# Patient Record
Sex: Female | Born: 1965
Health system: Southern US, Community
[De-identification: ages and names within clinical notes are randomized; demographics above are authoritative.]

## PROBLEM LIST (undated history)

## (undated) DIAGNOSIS — D649 Anemia, unspecified: Secondary | ICD-10-CM

## (undated) DIAGNOSIS — I469 Cardiac arrest, cause unspecified: Secondary | ICD-10-CM

## (undated) DIAGNOSIS — D259 Leiomyoma of uterus, unspecified: Secondary | ICD-10-CM

## (undated) DIAGNOSIS — E785 Hyperlipidemia, unspecified: Secondary | ICD-10-CM

## (undated) DIAGNOSIS — R569 Unspecified convulsions: Secondary | ICD-10-CM

## (undated) DIAGNOSIS — E119 Type 2 diabetes mellitus without complications: Secondary | ICD-10-CM

## (undated) DIAGNOSIS — I10 Essential (primary) hypertension: Secondary | ICD-10-CM

## (undated) DIAGNOSIS — D509 Iron deficiency anemia, unspecified: Secondary | ICD-10-CM

## (undated) DIAGNOSIS — K648 Other hemorrhoids: Secondary | ICD-10-CM

## (undated) DIAGNOSIS — E876 Hypokalemia: Secondary | ICD-10-CM

## (undated) DIAGNOSIS — I4901 Ventricular fibrillation: Secondary | ICD-10-CM

## (undated) DIAGNOSIS — R918 Other nonspecific abnormal finding of lung field: Secondary | ICD-10-CM

## (undated) DIAGNOSIS — G931 Anoxic brain damage, not elsewhere classified: Secondary | ICD-10-CM

## (undated) DIAGNOSIS — R41841 Cognitive communication deficit: Secondary | ICD-10-CM

## (undated) HISTORY — DX: Anoxic brain damage, not elsewhere classified: G93.1

## (undated) HISTORY — DX: Cardiac arrest, cause unspecified: I46.9

## (undated) HISTORY — DX: Cognitive communication deficit: R41.841

## (undated) HISTORY — DX: Ventricular fibrillation: I49.01

## (undated) HISTORY — DX: Hyperlipidemia, unspecified: E78.5

## (undated) HISTORY — DX: Unspecified convulsions: R56.9

---

## 2011-12-09 DIAGNOSIS — N920 Excessive and frequent menstruation with regular cycle: Secondary | ICD-10-CM | POA: Insufficient documentation

## 2011-12-09 DIAGNOSIS — L508 Other urticaria: Secondary | ICD-10-CM | POA: Insufficient documentation

## 2011-12-09 DIAGNOSIS — I10 Essential (primary) hypertension: Secondary | ICD-10-CM

## 2012-11-11 ENCOUNTER — Ambulatory Visit: Payer: Self-pay | Admitting: Internal Medicine

## 2012-11-29 ENCOUNTER — Ambulatory Visit: Payer: Self-pay | Admitting: Internal Medicine

## 2013-02-20 ENCOUNTER — Emergency Department: Payer: Self-pay | Admitting: Emergency Medicine

## 2013-02-20 LAB — CBC
MCH: 31.6 pg (ref 26.0–34.0)
MCV: 93 fL (ref 80–100)
Platelet: 213 10*3/uL (ref 150–440)
RBC: 3.68 10*6/uL — ABNORMAL LOW (ref 3.80–5.20)
RDW: 14.1 % (ref 11.5–14.5)
WBC: 6.3 10*3/uL (ref 3.6–11.0)

## 2013-02-20 LAB — CK TOTAL AND CKMB (NOT AT ARMC)
CK, Total: 94 U/L (ref 21–215)
CK-MB: <0.5 ng/mL — ABNORMAL LOW (ref 0.5–3.6)

## 2013-02-20 LAB — COMPREHENSIVE METABOLIC PANEL
Albumin: 3.2 g/dL — ABNORMAL LOW (ref 3.4–5.0)
Anion Gap: 9 (ref 7–16)
BUN: 12 mg/dL (ref 7–18)
Bilirubin,Total: 0.2 mg/dL (ref 0.2–1.0)
Calcium, Total: 8.7 mg/dL (ref 8.5–10.1)
Co2: 24 mmol/L (ref 21–32)
Creatinine: 0.67 mg/dL (ref 0.60–1.30)
EGFR (African American): >60
Glucose: 99 mg/dL (ref 65–99)
Osmolality: 274 (ref 275–301)
SGPT (ALT): 17 U/L (ref 12–78)
Sodium: 137 mmol/L (ref 136–145)
Total Protein: 7.6 g/dL (ref 6.4–8.2)

## 2013-08-18 ENCOUNTER — Ambulatory Visit: Payer: Self-pay | Admitting: Family Medicine

## 2013-08-22 ENCOUNTER — Emergency Department: Payer: Self-pay | Admitting: Internal Medicine

## 2013-10-25 ENCOUNTER — Ambulatory Visit: Payer: Self-pay | Admitting: Physician Assistant

## 2014-01-02 ENCOUNTER — Ambulatory Visit: Payer: Self-pay | Admitting: Physician Assistant

## 2015-02-05 DIAGNOSIS — D649 Anemia, unspecified: Secondary | ICD-10-CM | POA: Insufficient documentation

## 2016-03-15 ENCOUNTER — Encounter: Payer: Self-pay | Admitting: *Deleted

## 2016-03-15 ENCOUNTER — Emergency Department
Admission: EM | Admit: 2016-03-15 | Discharge: 2016-03-15 | Disposition: A | Discharge status: Home / Self Care | Payer: BLUE CROSS/BLUE SHIELD | Attending: Emergency Medicine | Admitting: Emergency Medicine

## 2016-03-15 DIAGNOSIS — Z87891 Personal history of nicotine dependence: Secondary | ICD-10-CM | POA: Diagnosis not present

## 2016-03-15 DIAGNOSIS — R51 Headache: Secondary | ICD-10-CM | POA: Diagnosis present

## 2016-03-15 DIAGNOSIS — I10 Essential (primary) hypertension: Secondary | ICD-10-CM | POA: Insufficient documentation

## 2016-03-15 DIAGNOSIS — Z79899 Other long term (current) drug therapy: Secondary | ICD-10-CM | POA: Diagnosis not present

## 2016-03-15 DIAGNOSIS — E876 Hypokalemia: Secondary | ICD-10-CM | POA: Insufficient documentation

## 2016-03-15 HISTORY — DX: Essential (primary) hypertension: I10

## 2016-03-15 LAB — COMPREHENSIVE METABOLIC PANEL
ALBUMIN: 3.7 g/dL (ref 3.5–5.0)
ALK PHOS: 66 U/L (ref 38–126)
ALT: 23 U/L (ref 14–54)
AST: 24 U/L (ref 15–41)
Anion gap: 9 (ref 5–15)
BILIRUBIN TOTAL: 0.3 mg/dL (ref 0.3–1.2)
BUN: 10 mg/dL (ref 6–20)
CALCIUM: 8.8 mg/dL — AB (ref 8.9–10.3)
CO2: 26 mmol/L (ref 22–32)
Chloride: 105 mmol/L (ref 101–111)
Creatinine, Ser: 0.65 mg/dL (ref 0.44–1.00)
GFR calc Af Amer: >60 mL/min (ref >60)
GFR calc non Af Amer: >60 mL/min (ref >60)
GLUCOSE: 116 mg/dL — AB (ref 65–99)
Potassium: 2.9 mmol/L — CL (ref 3.5–5.1)
SODIUM: 140 mmol/L (ref 135–145)
TOTAL PROTEIN: 7.5 g/dL (ref 6.5–8.1)

## 2016-03-15 LAB — CBC
HCT: 40 % (ref 35.0–47.0)
Hemoglobin: 13.5 g/dL (ref 12.0–16.0)
MCH: 32.5 pg (ref 26.0–34.0)
MCHC: 33.6 g/dL (ref 32.0–36.0)
MCV: 96.6 fL (ref 80.0–100.0)
PLATELETS: 216 10*3/uL (ref 150–440)
RBC: 4.15 MIL/uL (ref 3.80–5.20)
RDW: 13.6 % (ref 11.5–14.5)
WBC: 5.7 10*3/uL (ref 3.6–11.0)

## 2016-03-15 LAB — URINALYSIS COMPLETE WITH MICROSCOPIC (ARMC ONLY)
BILIRUBIN URINE: NEGATIVE
GLUCOSE, UA: NEGATIVE mg/dL
HGB URINE DIPSTICK: NEGATIVE
Ketones, ur: NEGATIVE mg/dL
Leukocytes, UA: NEGATIVE
Nitrite: NEGATIVE
PH: 7 (ref 5.0–8.0)
Protein, ur: NEGATIVE mg/dL
Specific Gravity, Urine: 1.006 (ref 1.005–1.030)
WBC, UA: NONE SEEN WBC/hpf (ref 0–5)

## 2016-03-15 MED ORDER — POTASSIUM CHLORIDE ER 10 MEQ PO TBCR: 10.0000 meq | EXTENDED_RELEASE_TABLET | Freq: Every day | ORAL | Status: DC

## 2016-03-15 MED ORDER — POTASSIUM CHLORIDE CRYS ER 20 MEQ PO TBCR
20.0000 meq | EXTENDED_RELEASE_TABLET | Freq: Once | ORAL | Status: AC
  Administered 2016-03-15: 20 meq via ORAL
  Filled 2016-03-15: qty 1

## 2016-03-15 NOTE — ED Notes (Signed)
Patient c/o feeling "cloudy-headed" all week, but felt better today after eating and taking a nap. Patient has a history of low potassium and felt like this could be the problem, so she ate more potassium-rich foods and felt better. This afternoon she developed the same light-headedness, nausea and a headache. Patient denies blurry vision.

## 2016-03-15 NOTE — ED Provider Notes (Signed)
[facility] Emergency Department Provider Note  Time seen: 9:02 PM  I have reviewed the triage vital signs and the nursing notes.   HISTORY  Chief Complaint Headache and Nausea    HPI [name] [name] is a 50 y.o. female with a past medical history of hypertension, hypokalemia, presents the emergency department feeling "cloudy headed." According to the patient for the past one week she has been feeling cloudy/foggy. States occasionally she feels lightheaded, does state a history of heavy periods. She also states she takes potassium supplements due to hypokalemia but ran out last month and does not have her yearly physical until next month.Patient is concerned that her potassium might be low causing her to feel this way. Patient denies any headache. Denies any trouble speaking, weakness or numbness. Denies any fever, cough, congestion. She does feel nasal and sinus congestion which she states happens every year due to allergies.     Past Medical History  Diagnosis Date  . Hypertension     There are no active problems to display for this patient.   History reviewed. No pertinent past surgical history.  Current Outpatient Rx  Name  Route  Sig  Dispense  Refill  . chlorthalidone (HYGROTON) 25 MG tablet   Oral   Take 25 mg by mouth daily.           Allergies Review of patient's allergies indicates no known allergies.  No family history on file.  Social History Social History  Substance Use Topics  . Smoking status: Former Research scientist (life sciences)  . Smokeless tobacco: None  . Alcohol Use: No    Review of Systems Constitutional: Negative for fever. Cardiovascular: Negative for chest pain. Respiratory: Negative for shortness of breath. Gastrointestinal: Negative for abdominal pain Genitourinary: Negative for dysuria. Neurological: Negative for headache 10-point ROS otherwise negative.  ____________________________________________   PHYSICAL EXAM:  VITAL  SIGNS: ED Triage Vitals  Enc Vitals Group     BP 03/15/16 2020 164/96 mmHg     Pulse Rate 03/15/16 2020 74     Resp 03/15/16 2020 18     Temp 03/15/16 2020 98.9 F (37.2 C)     Temp Source 03/15/16 2020 Oral     SpO2 03/15/16 2020 98 %     Weight 03/15/16 2020 172 lb (78.019 kg)     Height --      Head Cir --      Peak Flow --      Pain Score --      Pain Loc --      Pain Edu? --      Excl. in [place]? --     Constitutional: Alert and oriented. Well appearing and in no distress. Eyes: Normal exam ENT   Head: Normocephalic and atraumatic. Patient does have significant earwax to right ear, but no obvious signs of infection.   Nose: No congestion   Mouth/Throat: Mucous membranes are moist. Cardiovascular: Normal rate, regular rhythm. No murmur Respiratory: Normal respiratory effort without tachypnea nor retractions. Breath sounds are clear  Gastrointestinal: Soft and nontender. No distention.   Musculoskeletal: Nontender with normal range of motion in all extremities. Neurologic:  Normal speech and language. No gross focal neurologic deficits Skin:  Skin is warm, dry and intact.  Psychiatric: Mood and affect are normal.  ____________________________________________   INITIAL IMPRESSION / ASSESSMENT AND PLAN / ED COURSE  Pertinent labs & imaging results that were available during my care of the patient were reviewed by me and considered  in my medical decision making (see chart for details).  Very well-appearing patient complaining of cloudy headedness/foggy headed feeling. States it is difficult to describe what she is experiencing. States it comes and goes, denies any issues currently. Denies any headache or weakness/numbness at any time. We will check labs, monitor closely in the emergency department.  Labs show mild hypokalemia, otherwise within normal limits. We'll discharge on potassium supplements. Patient agreeable plan we'll follow up with her primary care  doctor. ____________________________________________   FINAL CLINICAL IMPRESSION(S) / ED DIAGNOSES  Hypokalemia   Harvest Dark, MD 03/15/16 [phone]

## 2016-03-15 NOTE — Discharge Instructions (Signed)

## 2016-03-15 NOTE — ED Notes (Signed)
Notified Dr. Truitt Merle of critical lab value - Potassium 2.9.  Acknowledged, no new orders.

## 2016-03-19 ENCOUNTER — Encounter: Payer: Self-pay | Admitting: Emergency Medicine

## 2016-03-19 ENCOUNTER — Ambulatory Visit
Admission: EM | Admit: 2016-03-19 | Discharge: 2016-03-19 | Disposition: A | Discharge status: Home / Self Care | Payer: BLUE CROSS/BLUE SHIELD | Attending: Family Medicine | Admitting: Family Medicine

## 2016-03-19 DIAGNOSIS — E876 Hypokalemia: Secondary | ICD-10-CM | POA: Diagnosis not present

## 2016-03-19 DIAGNOSIS — H6123 Impacted cerumen, bilateral: Secondary | ICD-10-CM

## 2016-03-19 DIAGNOSIS — I10 Essential (primary) hypertension: Secondary | ICD-10-CM | POA: Diagnosis not present

## 2016-03-19 LAB — BASIC METABOLIC PANEL WITH GFR
Anion gap: 7 (ref 5–15)
BUN: 11 mg/dL (ref 6–20)
CO2: 28 mmol/L (ref 22–32)
Calcium: 8.7 mg/dL — ABNORMAL LOW (ref 8.9–10.3)
Chloride: 103 mmol/L (ref 101–111)
Creatinine, Ser: 0.55 mg/dL (ref 0.44–1.00)
GFR calc Af Amer: >60 mL/min
GFR calc non Af Amer: >60 mL/min
Glucose, Bld: 102 mg/dL — ABNORMAL HIGH (ref 65–99)
Potassium: 3.1 mmol/L — ABNORMAL LOW (ref 3.5–5.1)
Sodium: 138 mmol/L (ref 135–145)

## 2016-03-19 NOTE — Discharge Instructions (Signed)
Cerumen Impaction The structures of the external ear canal secrete a waxy substance known as cerumen. Excess cerumen can build up in the ear canal, causing a condition known as cerumen impaction. Cerumen impaction can cause ear pain and disrupt the function of the ear. The rate of cerumen production differs for each individual. In certain individuals, the configuration of the ear canal may decrease his or her ability to naturally remove cerumen. CAUSES Cerumen impaction is caused by excessive cerumen production or buildup. RISK FACTORS  Frequent use of swabs to clean ears.  Having narrow ear canals.  Having eczema.  Being dehydrated. SIGNS AND SYMPTOMS  Diminished hearing.  Ear drainage.  Ear pain.  Ear itch. TREATMENT Treatment may involve:  Over-the-counter or prescription ear drops to soften the cerumen.  Removal of cerumen by a health care provider. This may be done with:  Irrigation with warm water. This is the most common method of removal.  Ear curettes and other instruments.  Surgery. This may be done in severe cases. HOME CARE INSTRUCTIONS  Take medicines only as directed by your health care provider.  Do not insert objects into the ear with the intent of cleaning the ear. PREVENTION  Do not insert objects into the ear, even with the intent of cleaning the ear. Removing cerumen as a part of normal hygiene is not necessary, and the use of swabs in the ear canal is not recommended.  Drink enough water to keep your urine clear or pale yellow.  Control your eczema if you have it. SEEK MEDICAL CARE IF:  You develop ear pain.  You develop bleeding from the ear.  The cerumen does not clear after you use ear drops as directed.   This information is not intended to replace advice given to you by your health care provider. Make sure you discuss any questions you have with your health care provider.   Document Released: 12/17/2004 Document Revised: 11/30/2014  Document Reviewed: 06/26/2015 Elsevier Interactive Patient Education 2016 [employer].  Hypokalemia Hypokalemia means that the amount of potassium in the blood is lower than normal.Potassium is a chemical, called an electrolyte, that helps regulate the amount of fluid in the body. It also stimulates muscle contraction and helps nerves function properly.Most of the body's potassium is inside of cells, and only a very small amount is in the blood. Because the amount in the blood is so small, minor changes can be life-threatening. CAUSES  Antibiotics.  Diarrhea or vomiting.  Using laxatives too much, which can cause diarrhea.  Chronic kidney disease.  Water pills (diuretics).  Eating disorders (bulimia).  Low magnesium level.  Sweating a lot. SIGNS AND SYMPTOMS  Weakness.  Constipation.  Fatigue.  Muscle cramps.  Mental confusion.  Skipped heartbeats or irregular heartbeat (palpitations).  Tingling or numbness. DIAGNOSIS  Your health care provider can diagnose hypokalemia with blood tests. In addition to checking your potassium level, your health care provider may also check other lab tests. TREATMENT Hypokalemia can be treated with potassium supplements taken by mouth or adjustments in your current medicines. If your potassium level is very low, you may need to get potassium through a vein (IV) and be monitored in the hospital. A diet high in potassium is also helpful. Foods high in potassium are:  Nuts, such as peanuts and pistachios.  Seeds, such as sunflower seeds and pumpkin seeds.  Peas, lentils, and lima beans.  Whole grain and bran cereals and breads.  Fresh fruit and vegetables, such as apricots,  avocado, bananas, cantaloupe, kiwi, oranges, tomatoes, asparagus, and potatoes.  Orange and tomato juices.  Red meats.  Fruit yogurt. HOME CARE INSTRUCTIONS  Take all medicines as prescribed by your health care provider.  Maintain a healthy diet by  including nutritious food, such as fruits, vegetables, nuts, whole grains, and lean meats.  If you are taking a laxative, be sure to follow the directions on the label. SEEK MEDICAL CARE IF:  Your weakness gets worse.  You feel your heart pounding or racing.  You are vomiting or having diarrhea.  You are diabetic and having trouble keeping your blood glucose in the normal range. SEEK IMMEDIATE MEDICAL CARE IF:  You have chest pain, shortness of breath, or dizziness.  You are vomiting or having diarrhea for more than 2 days.  You faint. MAKE SURE YOU:   Understand these instructions.  Will watch your condition.  Will get help right away if you are not doing well or get worse.   This information is not intended to replace advice given to you by your health care provider. Make sure you discuss any questions you have with your health care provider.   Document Released: 11/09/2005 Document Revised: 11/30/2014 Document Reviewed: 05/12/2013 Elsevier Interactive Patient Education 2016 [employer].  Hypertension Hypertension, commonly called high blood pressure, is when the force of blood pumping through your arteries is too strong. Your arteries are the blood vessels that carry blood from your heart throughout your body. A blood pressure reading consists of a higher number over a lower number, such as 110/72. The higher number (systolic) is the pressure inside your arteries when your heart pumps. The lower number (diastolic) is the pressure inside your arteries when your heart relaxes. Ideally you want your blood pressure below 120/80. Hypertension forces your heart to work harder to pump blood. Your arteries may become narrow or stiff. Having untreated or uncontrolled hypertension can cause heart attack, stroke, kidney disease, and other problems. RISK FACTORS Some risk factors for high blood pressure are controllable. Others are not.  Risk factors you cannot control include:    Race. You may be at higher risk if you are African American.  Age. Risk increases with age.  Gender. Men are at higher risk than women before age 36 years. After age 19, women are at higher risk than men. Risk factors you can control include:  Not getting enough exercise or physical activity.  Being overweight.  Getting too much fat, sugar, calories, or salt in your diet.  Drinking too much alcohol. SIGNS AND SYMPTOMS Hypertension does not usually cause signs or symptoms. Extremely high blood pressure (hypertensive crisis) may cause headache, anxiety, shortness of breath, and nosebleed. DIAGNOSIS To check if you have hypertension, your health care provider will measure your blood pressure while you are seated, with your arm held at the level of your heart. It should be measured at least twice using the same arm. Certain conditions can cause a difference in blood pressure between your right and left arms. A blood pressure reading that is higher than normal on one occasion does not mean that you need treatment. If it is not clear whether you have high blood pressure, you may be asked to return on a different day to have your blood pressure checked again. Or, you may be asked to monitor your blood pressure at home for 1 or more weeks. TREATMENT Treating high blood pressure includes making lifestyle changes and possibly taking medicine. Living a healthy lifestyle can help  lower high blood pressure. You may need to change some of your habits. Lifestyle changes may include:  Following the DASH diet. This diet is high in fruits, vegetables, and whole grains. It is low in salt, red meat, and added sugars.  Keep your sodium intake below 2,300 mg per day.  Getting at least 30-45 minutes of aerobic exercise at least 4 times per week.  Losing weight if necessary.  Not smoking.  Limiting alcoholic beverages.  Learning ways to reduce stress. Your health care provider may prescribe medicine  if lifestyle changes are not enough to get your blood pressure under control, and if one of the following is true:  You are 28-32 years of age and your systolic blood pressure is above 140.  You are 22 years of age or older, and your systolic blood pressure is above 150.  Your diastolic blood pressure is above 90.  You have diabetes, and your systolic blood pressure is over [id number] or your diastolic blood pressure is over 90.  You have kidney disease and your blood pressure is above 140/90.  You have heart disease and your blood pressure is above 140/90. Your personal target blood pressure may vary depending on your medical conditions, your age, and other factors. HOME CARE INSTRUCTIONS  Have your blood pressure rechecked as directed by your health care provider.   Take medicines only as directed by your health care provider. Follow the directions carefully. Blood pressure medicines must be taken as prescribed. The medicine does not work as well when you skip doses. Skipping doses also puts you at risk for problems.  Do not smoke.   Monitor your blood pressure at home as directed by your health care provider. SEEK MEDICAL CARE IF:   You think you are having a reaction to medicines taken.  You have recurrent headaches or feel dizzy.  You have swelling in your ankles.  You have trouble with your vision. SEEK IMMEDIATE MEDICAL CARE IF:  You develop a severe headache or confusion.  You have unusual weakness, numbness, or feel faint.  You have severe chest or abdominal pain.  You vomit repeatedly.  You have trouble breathing. MAKE SURE YOU:   Understand these instructions.  Will watch your condition.  Will get help right away if you are not doing well or get worse.   This information is not intended to replace advice given to you by your health care provider. Make sure you discuss any questions you have with your health care provider.   Document Released: 11/09/2005  Document Revised: 03/26/2015 Document Reviewed: 09/01/2013 Elsevier Interactive Patient Education [employer].

## 2016-03-19 NOTE — ED Provider Notes (Signed)
CSN: [id number]     Arrival date & time 03/19/16  [id number] History   First MD Initiated Contact with Patient 03/19/16 [phone]     Chief Complaint  Patient presents with  . Ear Fullness   (Consider location/radiation/quality/duration/timing/severity/associated sxs/prior Treatment) HPI  This is a 50 year old female who presents with the complaint of fullness in her right ear. She has been using over-the-counter preparations to dissolve wax but it seems to have only worsened her problem. She was in the emergency department 4 days ago complaining of a" swimmy headed feeling", was found to have hypokalemia at 2.9 placed on supplemental potassium rx which had run out. She states that she has an appointment with her primary care, Dr. Carrie Mew, at [facility] clinic on May 4. She is feeling improved since starting the supplemental potassium.       Past Medical History  Diagnosis Date  . Hypertension    History reviewed. No pertinent past surgical history. History reviewed. No pertinent family history. Social History  Substance Use Topics  . Smoking status: Former Research scientist (life sciences)  . Smokeless tobacco: None  . Alcohol Use: No   OB History    No data available     Review of Systems  Constitutional: Negative for fever, chills, activity change and fatigue.  HENT: Positive for hearing loss.     Allergies  Review of patient's allergies indicates no known allergies.  Home Medications   Prior to Admission medications   Medication Sig Start Date End Date Taking? Authorizing Provider  chlorthalidone (HYGROTON) 25 MG tablet Take 25 mg by mouth daily.    Historical Provider, MD  fexofenadine (ALLEGRA) 180 MG tablet Take 180 mg by mouth daily.    Historical Provider, MD  potassium chloride (K-DUR) 10 MEQ tablet Take 1 tablet (10 mEq total) by mouth daily. 03/15/16   Harvest Dark, MD   Meds Ordered and Administered this Visit  Medications - No data to display  BP 159/95 mmHg  Pulse 68  Temp(Src)  97.3 F (36.3 C) (Tympanic)  Resp 16  Ht 5\' 1"  (1.549 m)  Wt 172 lb (78.019 kg)  BMI 32.52 kg/m2  SpO2 100%  LMP 03/13/2016 (Exact Date) No data found.   Physical Exam  Constitutional: She is oriented to person, place, and time. She appears well-developed and well-nourished. No distress.  HENT:  Head: Normocephalic and atraumatic.  Nose: Nose normal.  Mouth/Throat: Oropharynx is clear and moist. No oropharyngeal exudate.  Both ears show a heavy burden of cerumen with impaction bilaterally.  Eyes: Conjunctivae are normal. Pupils are equal, round, and reactive to light.  Neck: Normal range of motion. Neck supple.  Musculoskeletal: Normal range of motion. She exhibits no edema or tenderness.  Lymphadenopathy:    She has no cervical adenopathy.  Neurological: She is alert and oriented to person, place, and time.  Skin: Skin is warm and dry. She is not diaphoretic.  Psychiatric: She has a normal mood and affect. Her behavior is normal. Judgment and thought content normal.  Nursing note and vitals reviewed.   ED Course  Procedures (including critical care time)  Labs Review Labs Reviewed  BASIC METABOLIC PANEL    Imaging Review No results found.   Visual Acuity Review  Right Eye Distance:   Left Eye Distance:   Bilateral Distance:    Right Eye Near:   Left Eye Near:    Bilateral Near:         MDM   1. Cerumen impaction, bilateral   2.  Hypokalemia due to inadequate potassium intake   3. Essential hypertension    Discharge Medication List as of 03/19/2016 10:50 AM    Plan: 1. Test/x-ray results and diagnosis reviewed with patient 2. rx as per orders; risks, benefits, potential side effects reviewed with patient 3. Recommend supportive treatment with Supplemental potassium follow-up with her primary care physician. The machine for chemistry was out of order and I told her that we will call her with the results as soon as they are available. She does not hear  from [place] at 5:00 she should call here for the results. I've instructed her to continue to instill the Debrox ear drops in her ears for the next 4 days followed by a thorough irrigation. This should completely clear the remaining wax. She did have a small amount of wax residual in the left ear. The right ear was completely clear. I told her that a cotton ball soaked in mineral oil and placed in her ear for 10 minutes once a week will likely prevent the wax buildup that she experiences. He should be examined every 6-12 months. She is to follow-up with her primary care as her blood pressures have been running slightly higher and her potassium was low and she will require a new tasks him supplementation unless her prescriptions are changed to a potassium sparing drug. 4. F/u prn if symptoms worsen or don't improve     Lorin Picket, PA-C 03/19/16 1114

## 2016-03-19 NOTE — ED Notes (Signed)
Patient c/o fullness in her right ear since Sunday.

## 2016-04-01 ENCOUNTER — Other Ambulatory Visit: Payer: Self-pay | Admitting: Physician Assistant

## 2016-04-01 DIAGNOSIS — Z1231 Encounter for screening mammogram for malignant neoplasm of breast: Secondary | ICD-10-CM

## 2016-04-01 DIAGNOSIS — E876 Hypokalemia: Secondary | ICD-10-CM | POA: Insufficient documentation

## 2016-04-15 ENCOUNTER — Ambulatory Visit: Payer: BLUE CROSS/BLUE SHIELD | Attending: Physician Assistant

## 2016-11-09 ENCOUNTER — Ambulatory Visit: Payer: BLUE CROSS/BLUE SHIELD | Attending: Physician Assistant

## 2017-08-04 ENCOUNTER — Other Ambulatory Visit: Payer: Self-pay | Admitting: Physician Assistant

## 2017-08-04 DIAGNOSIS — Z1231 Encounter for screening mammogram for malignant neoplasm of breast: Secondary | ICD-10-CM

## 2017-08-16 ENCOUNTER — Ambulatory Visit
Admission: RE | Admit: 2017-08-16 | Discharge: 2017-08-16 | Disposition: A | Discharge status: Home / Self Care | Payer: PRIVATE HEALTH INSURANCE | Source: Ambulatory Visit | Attending: Physician Assistant | Admitting: Physician Assistant

## 2017-08-16 DIAGNOSIS — Z1231 Encounter for screening mammogram for malignant neoplasm of breast: Secondary | ICD-10-CM | POA: Diagnosis present

## 2017-10-24 ENCOUNTER — Emergency Department
Admission: EM | Admit: 2017-10-24 | Discharge: 2017-10-24 | Disposition: A | Discharge status: Home / Self Care | Payer: PRIVATE HEALTH INSURANCE | Attending: Emergency Medicine | Admitting: Emergency Medicine

## 2017-10-24 DIAGNOSIS — I10 Essential (primary) hypertension: Secondary | ICD-10-CM | POA: Insufficient documentation

## 2017-10-24 DIAGNOSIS — J069 Acute upper respiratory infection, unspecified: Secondary | ICD-10-CM | POA: Diagnosis not present

## 2017-10-24 DIAGNOSIS — Z79899 Other long term (current) drug therapy: Secondary | ICD-10-CM | POA: Insufficient documentation

## 2017-10-24 DIAGNOSIS — E876 Hypokalemia: Secondary | ICD-10-CM | POA: Diagnosis not present

## 2017-10-24 DIAGNOSIS — R110 Nausea: Secondary | ICD-10-CM

## 2017-10-24 DIAGNOSIS — Z87891 Personal history of nicotine dependence: Secondary | ICD-10-CM | POA: Insufficient documentation

## 2017-10-24 LAB — COMPREHENSIVE METABOLIC PANEL
ALBUMIN: 3.6 g/dL (ref 3.5–5.0)
ALT: 44 U/L (ref 14–54)
ANION GAP: 11 (ref 5–15)
AST: 54 U/L — ABNORMAL HIGH (ref 15–41)
Alkaline Phosphatase: 65 U/L (ref 38–126)
BILIRUBIN TOTAL: 0.8 mg/dL (ref 0.3–1.2)
BUN: 8 mg/dL (ref 6–20)
CO2: 25 mmol/L (ref 22–32)
Calcium: 8.6 mg/dL — ABNORMAL LOW (ref 8.9–10.3)
Chloride: 97 mmol/L — ABNORMAL LOW (ref 101–111)
Creatinine, Ser: 0.5 mg/dL (ref 0.44–1.00)
GFR calc Af Amer: >60 mL/min (ref >60)
Glucose, Bld: 150 mg/dL — ABNORMAL HIGH (ref 65–99)
POTASSIUM: 2.6 mmol/L — AB (ref 3.5–5.1)
Sodium: 133 mmol/L — ABNORMAL LOW (ref 135–145)
Total Protein: 7.5 g/dL (ref 6.5–8.1)

## 2017-10-24 LAB — CBC
HCT: 38.8 % (ref 35.0–47.0)
Hemoglobin: 13.2 g/dL (ref 12.0–16.0)
MCH: 31.9 pg (ref 26.0–34.0)
MCHC: 34 g/dL (ref 32.0–36.0)
MCV: 93.8 fL (ref 80.0–100.0)
PLATELETS: 207 10*3/uL (ref 150–440)
RBC: 4.14 MIL/uL (ref 3.80–5.20)
RDW: 13.9 % (ref 11.5–14.5)
WBC: 15.9 10*3/uL — ABNORMAL HIGH (ref 3.6–11.0)

## 2017-10-24 LAB — BASIC METABOLIC PANEL
ANION GAP: 9 (ref 5–15)
BUN: 7 mg/dL (ref 6–20)
CHLORIDE: 100 mmol/L — AB (ref 101–111)
CO2: 25 mmol/L (ref 22–32)
Calcium: 8.2 mg/dL — ABNORMAL LOW (ref 8.9–10.3)
Creatinine, Ser: 0.71 mg/dL (ref 0.44–1.00)
Glucose, Bld: 107 mg/dL — ABNORMAL HIGH (ref 65–99)
POTASSIUM: 3 mmol/L — AB (ref 3.5–5.1)
SODIUM: 134 mmol/L — AB (ref 135–145)

## 2017-10-24 LAB — URINALYSIS, COMPLETE (UACMP) WITH MICROSCOPIC
Bacteria, UA: NONE SEEN
Bilirubin Urine: NEGATIVE
GLUCOSE, UA: NEGATIVE mg/dL
Hgb urine dipstick: NEGATIVE
KETONES UR: NEGATIVE mg/dL
Leukocytes, UA: NEGATIVE
Nitrite: NEGATIVE
PH: 7 (ref 5.0–8.0)
Protein, ur: NEGATIVE mg/dL
RBC / HPF: NONE SEEN RBC/hpf (ref 0–5)
SPECIFIC GRAVITY, URINE: 1.012 (ref 1.005–1.030)

## 2017-10-24 LAB — LIPASE, BLOOD: Lipase: 25 U/L (ref 11–51)

## 2017-10-24 LAB — MAGNESIUM: MAGNESIUM: 1.7 mg/dL (ref 1.7–2.4)

## 2017-10-24 LAB — TROPONIN I: Troponin I: 0.03 ng/mL (ref <0.03)

## 2017-10-24 MED ORDER — POTASSIUM CHLORIDE CRYS ER 20 MEQ PO TBCR
40.0000 meq | EXTENDED_RELEASE_TABLET | Freq: Once | ORAL | Status: AC
  Administered 2017-10-24: 40 meq via ORAL
  Filled 2017-10-24: qty 2

## 2017-10-24 MED ORDER — POTASSIUM CHLORIDE 10 MEQ/100ML IV SOLN
10.0000 meq | Freq: Once | INTRAVENOUS | Status: AC
  Administered 2017-10-24: 10 meq via INTRAVENOUS
  Filled 2017-10-24: qty 100

## 2017-10-24 MED ORDER — POTASSIUM CHLORIDE CRYS ER 20 MEQ PO TBCR: 20.0000 meq | EXTENDED_RELEASE_TABLET | Freq: Two times a day (BID) | ORAL | 0 refills | Status: DC

## 2017-10-24 NOTE — ED Triage Notes (Signed)
Pt presents via POV stating "thought I had a cold" beginning Wednesday however felt cold chills last PM and woke up sweating. Reports nausea this am.

## 2017-10-24 NOTE — ED Provider Notes (Addendum)
[facility] Emergency Department Provider Note  ____________________________________________   I have reviewed the triage vital signs and the nursing notes.   HISTORY  Chief Complaint Nausea    HPI TANIJA GERMANI is a 51 y.o. female who presents today complaining of runny nose, nausea, and chills this morning.  She did not vomit.  She has no chest pain she has no shortness of breath.  She has no cough.  She states that she is starting to feel much better since she is gotten here.  She denies fever.  She did have some chills. Started yesterday with rhinorrhea and this morning.  At this time however she is feeling much better.  She does have a history of hypokalemia she takes potassium supplementation she is on HCTZ.  No recent change in that.  Past Medical History:  Diagnosis Date  . Hypertension     There are no active problems to display for this patient.   History reviewed. No pertinent surgical history.  Prior to Admission medications   Medication Sig Start Date End Date Taking? Authorizing Provider  chlorthalidone (HYGROTON) 25 MG tablet Take 25 mg by mouth daily.    [provider]  fexofenadine (ALLEGRA) 180 MG tablet Take 180 mg by mouth daily.    [provider]  potassium chloride (K-DUR) 10 MEQ tablet Take 1 tablet (10 mEq total) by mouth daily. 03/15/16   Harvest Dark, MD    Allergies Patient has no known allergies.  History reviewed. No pertinent family history.  Social History Social History   Tobacco Use  . Smoking status: Former Research scientist (life sciences)  . Smokeless tobacco: Never Used  Substance Use Topics  . Alcohol use: No  . Drug use: Not on file    Review of Systems Constitutional: Subjectively warm Eyes: No visual changes. ENT: No sore throat. No stiff neck no neck pain Cardiovascular: Denies chest pain. Respiratory: Denies shortness of breath. Gastrointestinal:   no vomiting.  No diarrhea.  No constipation.   Positive nausea Genitourinary: Negative for dysuria. Musculoskeletal: Negative lower extremity swelling Skin: Negative for rash. Neurological: Negative for severe headaches, focal weakness or numbness.   ____________________________________________   PHYSICAL EXAM:  VITAL SIGNS: ED Triage Vitals  Enc Vitals Group     BP 10/24/17 0732 126/77     Pulse Rate 10/24/17 0732 (!) 104     Resp 10/24/17 0732 18     Temp 10/24/17 0732 98.7 F (37.1 C)     Temp Source 10/24/17 0732 Oral     SpO2 10/24/17 0732 98 %     Weight 10/24/17 0733 189 lb (85.7 kg)     Height 10/24/17 0733 5\' 1"  (1.549 m)     Head Circumference --      Peak Flow --      Pain Score --      Pain Loc --      Pain Edu? --      Excl. in [place]? --     Constitutional: Alert and oriented. Well appearing and in no acute distress. Eyes: Conjunctivae are normal Head: Atraumatic HEENT: Positive congestion/rhinnorhea. Mucous membranes are moist.  Oropharynx non-erythematous Neck:   Nontender with no meningismus, no masses, no stridor Cardiovascular: Normal rate, regular rhythm. Grossly normal heart sounds.  Good peripheral circulation. Respiratory: Normal respiratory effort.  No retractions. Lungs CTAB. Abdominal: Soft and nontender. No distention. No guarding no rebound Back:  There is no focal tenderness or step off.  there is no midline  tenderness there are no lesions noted. there is no CVA tenderness Musculoskeletal: No lower extremity tenderness, no upper extremity tenderness. No joint effusions, no DVT signs strong distal pulses no edema Neurologic:  Normal speech and language. No gross focal neurologic deficits are appreciated.  Skin:  Skin is warm, dry and intact. No rash noted. Psychiatric: Mood and affect are normal. Speech and behavior are normal.  ____________________________________________   LABS (all labs ordered are listed, but only abnormal results are displayed)  Labs Reviewed  COMPREHENSIVE  METABOLIC PANEL - Abnormal; Notable for the following components:      Result Value   Sodium 133 (*)    Potassium 2.6 (*)    Chloride 97 (*)    Glucose, Bld 150 (*)    Calcium 8.6 (*)    AST 54 (*)    All other components within normal limits  CBC - Abnormal; Notable for the following components:   WBC 15.9 (*)    All other components within normal limits  TROPONIN I - Abnormal; Notable for the following components:   Troponin I 0.03 (*)    All other components within normal limits  LIPASE, BLOOD  URINALYSIS, COMPLETE (UACMP) WITH MICROSCOPIC  MAGNESIUM    Pertinent labs  results that were available during my care of the patient were reviewed by me and considered in my medical decision making (see chart for details). ____________________________________________  EKG  I personally interpreted any EKGs ordered by me or triage Late entry, sinus rhythm, RSR prime noted, no acute ST elevation or depression nonspecific ST changes, rate 83 ____________________________________________  RADIOLOGY  Pertinent labs & imaging results that were available during my care of the patient were reviewed by me and considered in my medical decision making (see chart for details). If possible, patient and/or family made aware of any abnormal findings.  No results found. ____________________________________________    PROCEDURES  Procedure(s) performed: None  Procedures  Critical Care performed: None  ____________________________________________   INITIAL IMPRESSION / ASSESSMENT AND PLAN / ED COURSE  Pertinent labs & imaging results that were available during my care of the patient were reviewed by me and considered in my medical decision making (see chart for details).  Patient here with nausea, subjective fevers, rhinorrhea, however very well-appearing most likely this is viral pathology we did do basic blood work given her nausea and her history of high blood pressure I did do a  troponin which is essentially negative.  We will consider whether it needs to be rechecked.  She has no chest pain or symptoms at this time.  Her potassium however is quite low at 2.6, will EKG evaluation is indicated and supplementation which we are initiating.  Patient does have a very low potassium at baseline.  We will discuss with her whether she consents to admission for potassium repletion.  Magnesium is also pending.    ____________________________________________   FINAL CLINICAL IMPRESSION(S) / ED DIAGNOSES  Final diagnoses:  None      This chart was dictated using voice recognition software.  Despite best efforts to proofread,  errors can occur which can change meaning.      Schuyler Amor, MD 10/24/17 1013    Schuyler Amor, MD 11/07/17 2226

## 2018-04-13 ENCOUNTER — Encounter: Payer: Self-pay | Admitting: Emergency Medicine

## 2018-04-13 ENCOUNTER — Other Ambulatory Visit: Payer: Self-pay

## 2018-04-13 ENCOUNTER — Ambulatory Visit
Admission: EM | Admit: 2018-04-13 | Discharge: 2018-04-13 | Disposition: A | Discharge status: Home / Self Care | Payer: PRIVATE HEALTH INSURANCE | Attending: Family Medicine | Admitting: Family Medicine

## 2018-04-13 DIAGNOSIS — B354 Tinea corporis: Secondary | ICD-10-CM | POA: Diagnosis not present

## 2018-04-13 DIAGNOSIS — L739 Follicular disorder, unspecified: Secondary | ICD-10-CM

## 2018-04-13 MED ORDER — CEPHALEXIN 500 MG PO CAPS: 500.0000 mg | ORAL_CAPSULE | Freq: Three times a day (TID) | ORAL | 0 refills | Status: AC

## 2018-04-13 MED ORDER — KETOCONAZOLE 2 % EX CREA: 1.0000 "application " | TOPICAL_CREAM | Freq: Every day | CUTANEOUS | 0 refills | Status: DC

## 2018-04-13 MED ORDER — MUPIROCIN 2 % EX OINT: TOPICAL_OINTMENT | CUTANEOUS | 0 refills | Status: DC

## 2018-04-13 NOTE — ED Provider Notes (Signed)
[facility] ____________________________________________  Time seen: Approximately 10:53 AM  I have reviewed the triage vital signs and the nursing notes.   HISTORY  Chief Complaint  Rash  HPI [name] [name] is a 52 y.o. female presenting for evaluation of rash to groin area present for the last several days.  Patient reports that she initially noticed a round raised red area to left groin that was slightly itchy, but nontender.  States that she then shaved the groin area and states that now she has bumps on both sides.  Denies associated pains, fevers, insect bites, drainage or other complaints.  States did apply some topical Lotrisone cream without changes.  Reports otherwise feels well.  Denies changes in foods, medicines, lotions, detergents or other contacts.  Denies history of same in the past.  Does report she has sensitive skin. Denies recent sickness. Denies recent antibiotic use.    Past Medical History:  Diagnosis Date  . Hypertension     There are no active problems to display for this patient.   History reviewed. No pertinent surgical history.   No current facility-administered medications for this encounter.   Current Outpatient Medications:  .  chlorthalidone (HYGROTON) 25 MG tablet, Take 25 mg by mouth daily., Disp: , Rfl:  .  fexofenadine (ALLEGRA) 180 MG tablet, Take 180 mg by mouth daily., Disp: , Rfl:  .  potassium chloride SA (K-DUR,KLOR-CON) 20 MEQ tablet, Take 1 tablet (20 mEq total) by mouth 2 (two) times daily., Disp: 10 tablet, Rfl: 0 .  cephALEXin (KEFLEX) 500 MG capsule, Take 1 capsule (500 mg total) by mouth 3 (three) times daily for 7 days., Disp: 21 capsule, Rfl: 0 .  ketoconazole (NIZORAL) 2 % cream, Apply 1 application topically daily. For 2 weeks, Disp: 15 g, Rfl: 0 .  mupirocin ointment (BACTROBAN) 2 %, Apply two times a day for 7 days., Disp: 22 g, Rfl: 0  Allergies Patient has no known allergies.  Family History  Problem  Relation Age of Onset  . Hypertension Mother   . Leukemia Mother   . Diabetes Mother   . Healthy Father     Social History Social History   Tobacco Use  . Smoking status: Former Smoker    Last attempt to quit: 2007    Years since quitting: 12.3  . Smokeless tobacco: Never Used  Substance Use Topics  . Alcohol use: No  . Drug use: Never    Review of Systems Constitutional: No fever/chills Cardiovascular: Denies chest pain. Respiratory: Denies shortness of breath. Gastrointestinal: No abdominal pain.   Genitourinary: Negative for dysuria. Musculoskeletal: Negative for back pain. Skin: Positive for rash.  ____________________________________________   PHYSICAL EXAM:  VITAL SIGNS: ED Triage Vitals [04/13/18 0931]  Enc Vitals Group     BP (!) 151/97     Pulse Rate 64     Resp 16     Temp 97.8 F (36.6 C)     Temp Source Oral     SpO2 98 %     Weight 186 lb (84.4 kg)     Height 5\' 1"  (1.549 m)     Head Circumference      Peak Flow      Pain Score 0     Pain Loc      Pain Edu?      Excl. in [place]?     Constitutional: Alert and oriented. Well appearing and in no acute distress. ENT      Head: Normocephalic and  atraumatic.      Mouth/Throat: Mucous membranes are moist.Oropharynx non-erythematous. Cardiovascular: Normal rate, regular rhythm. Grossly normal heart sounds.  Good peripheral circulation. Respiratory: Normal respiratory effort without tachypnea nor retractions. Breath sounds are clear and equal bilaterally. No wheezes, rales, rhonchi. Gastrointestinal: Soft and nontender. No distention. Normal Bowel sounds. No CVA tenderness. Musculoskeletal: Steady gait.  Neurologic:  Normal speech and language. Speech is normal. No gait instability.  Skin:  Skin is warm, dry.  Except: Just inferior to left inguinal crease mildly erythematous oval rash with raised borders with dry skin present, scattered surrounding to left inguinal as well as right inguinal creases  mildly erythematous papules, nontender, no induration, no drainage, no crusting. Psychiatric: Mood and affect are normal. Speech and behavior are normal. Patient exhibits appropriate insight and judgment   ___________________________________________   LABS (all labs ordered are listed, but only abnormal results are displayed)  Labs Reviewed - No data to display ____________________________________________  PROCEDURES Procedures    INITIAL IMPRESSION / ASSESSMENT AND PLAN / ED COURSE  Pertinent labs & imaging results that were available during my care of the patient were reviewed by me and considered in my medical decision making (see chart for details).  Well-appearing patient.  No acute distress.Left inguinal rash appearance consistent with tinea cruris.  Patient does report area worsening after shaving that area, concern for spread of tinea versus folliculitis.  Will treat with topical ketoconazole as well as Bactroban.  Rx given for oral Keflex if she noticed the redness and areas continue to spread as concern for infectious folliculitis after 3 days.Discussed indication, risks and benefits of medications with patient. Discussed indication, risks and benefits of medications with patient.  Discussed follow up with Primary care physician this week. Discussed follow up and return parameters including no resolution or any worsening concerns. Patient verbalized understanding and agreed to plan.   ____________________________________________   FINAL CLINICAL IMPRESSION(S) / ED DIAGNOSES  Final diagnoses:  Ringworm, body  Folliculitis     ED Discharge Orders        Ordered    ketoconazole (NIZORAL) 2 % cream  Daily     04/13/18 1019    mupirocin ointment (BACTROBAN) 2 %     04/13/18 1019    cephALEXin (KEFLEX) 500 MG capsule  3 times daily     04/13/18 1019       Note: This dictation was prepared with Dragon dictation along with smaller phrase technology. Any  transcriptional errors that result from this process are unintentional.         Marylene Land, NP 04/13/18 1143

## 2018-04-13 NOTE — Discharge Instructions (Signed)
Take medication as prescribed. Rest. Drink plenty of fluids. Keep dry. Avoid irritation.  Follow up with your primary care physician this week as needed. Return to Urgent care for new or worsening concerns.

## 2018-04-13 NOTE — ED Triage Notes (Signed)
Patient in today c/o rash in the bilateral upper thigh/groin area x 4 days. Patient has been using an OTC anti-fungal type cream without relief.

## 2020-11-23 DIAGNOSIS — G931 Anoxic brain damage, not elsewhere classified: Secondary | ICD-10-CM

## 2020-11-23 HISTORY — DX: Anoxic brain damage, not elsewhere classified: G93.1

## 2020-12-02 ENCOUNTER — Encounter: Payer: Self-pay | Admitting: Internal Medicine

## 2020-12-02 ENCOUNTER — Other Ambulatory Visit: Payer: Self-pay

## 2020-12-02 ENCOUNTER — Emergency Department: Payer: Self-pay

## 2020-12-02 ENCOUNTER — Inpatient Hospital Stay
Admission: EM | Admit: 2020-12-02 | Discharge: 2021-01-10 | DRG: 004 | Disposition: A | Discharge status: Rehabilitation Facility | Payer: Self-pay | Attending: Family Medicine | Admitting: Family Medicine

## 2020-12-02 DIAGNOSIS — I5041 Acute combined systolic (congestive) and diastolic (congestive) heart failure: Secondary | ICD-10-CM | POA: Diagnosis not present

## 2020-12-02 DIAGNOSIS — N938 Other specified abnormal uterine and vaginal bleeding: Secondary | ICD-10-CM | POA: Diagnosis present

## 2020-12-02 DIAGNOSIS — Z452 Encounter for adjustment and management of vascular access device: Secondary | ICD-10-CM

## 2020-12-02 DIAGNOSIS — N390 Urinary tract infection, site not specified: Secondary | ICD-10-CM | POA: Diagnosis present

## 2020-12-02 DIAGNOSIS — J690 Pneumonitis due to inhalation of food and vomit: Secondary | ICD-10-CM

## 2020-12-02 DIAGNOSIS — E871 Hypo-osmolality and hyponatremia: Secondary | ICD-10-CM | POA: Diagnosis not present

## 2020-12-02 DIAGNOSIS — Z931 Gastrostomy status: Secondary | ICD-10-CM

## 2020-12-02 DIAGNOSIS — G931 Anoxic brain damage, not elsewhere classified: Secondary | ICD-10-CM | POA: Diagnosis present

## 2020-12-02 DIAGNOSIS — I469 Cardiac arrest, cause unspecified: Secondary | ICD-10-CM

## 2020-12-02 DIAGNOSIS — G253 Myoclonus: Secondary | ICD-10-CM | POA: Diagnosis present

## 2020-12-02 DIAGNOSIS — R570 Cardiogenic shock: Secondary | ICD-10-CM | POA: Diagnosis present

## 2020-12-02 DIAGNOSIS — I313 Pericardial effusion (noninflammatory): Secondary | ICD-10-CM | POA: Diagnosis present

## 2020-12-02 DIAGNOSIS — E8809 Other disorders of plasma-protein metabolism, not elsewhere classified: Secondary | ICD-10-CM | POA: Diagnosis present

## 2020-12-02 DIAGNOSIS — R7303 Prediabetes: Secondary | ICD-10-CM

## 2020-12-02 DIAGNOSIS — Z6839 Body mass index (BMI) 39.0-39.9, adult: Secondary | ICD-10-CM

## 2020-12-02 DIAGNOSIS — Z20822 Contact with and (suspected) exposure to covid-19: Secondary | ICD-10-CM | POA: Diagnosis present

## 2020-12-02 DIAGNOSIS — G928 Other toxic encephalopathy: Secondary | ICD-10-CM | POA: Diagnosis present

## 2020-12-02 DIAGNOSIS — Z978 Presence of other specified devices: Secondary | ICD-10-CM

## 2020-12-02 DIAGNOSIS — J9601 Acute respiratory failure with hypoxia: Secondary | ICD-10-CM | POA: Diagnosis present

## 2020-12-02 DIAGNOSIS — Z87891 Personal history of nicotine dependence: Secondary | ICD-10-CM

## 2020-12-02 DIAGNOSIS — I4901 Ventricular fibrillation: Principal | ICD-10-CM | POA: Diagnosis present

## 2020-12-02 DIAGNOSIS — J9600 Acute respiratory failure, unspecified whether with hypoxia or hypercapnia: Secondary | ICD-10-CM

## 2020-12-02 DIAGNOSIS — K449 Diaphragmatic hernia without obstruction or gangrene: Secondary | ICD-10-CM | POA: Diagnosis present

## 2020-12-02 DIAGNOSIS — G4089 Other seizures: Secondary | ICD-10-CM | POA: Diagnosis not present

## 2020-12-02 DIAGNOSIS — E870 Hyperosmolality and hypernatremia: Secondary | ICD-10-CM | POA: Diagnosis not present

## 2020-12-02 DIAGNOSIS — J189 Pneumonia, unspecified organism: Secondary | ICD-10-CM

## 2020-12-02 DIAGNOSIS — R1110 Vomiting, unspecified: Secondary | ICD-10-CM

## 2020-12-02 DIAGNOSIS — Z79899 Other long term (current) drug therapy: Secondary | ICD-10-CM

## 2020-12-02 DIAGNOSIS — Z833 Family history of diabetes mellitus: Secondary | ICD-10-CM

## 2020-12-02 DIAGNOSIS — N179 Acute kidney failure, unspecified: Secondary | ICD-10-CM | POA: Diagnosis present

## 2020-12-02 DIAGNOSIS — E876 Hypokalemia: Secondary | ICD-10-CM | POA: Diagnosis not present

## 2020-12-02 DIAGNOSIS — E785 Hyperlipidemia, unspecified: Secondary | ICD-10-CM

## 2020-12-02 DIAGNOSIS — D62 Acute posthemorrhagic anemia: Secondary | ICD-10-CM | POA: Diagnosis present

## 2020-12-02 DIAGNOSIS — R403 Persistent vegetative state: Secondary | ICD-10-CM | POA: Diagnosis present

## 2020-12-02 DIAGNOSIS — I110 Hypertensive heart disease with heart failure: Secondary | ICD-10-CM | POA: Diagnosis present

## 2020-12-02 DIAGNOSIS — R739 Hyperglycemia, unspecified: Secondary | ICD-10-CM | POA: Diagnosis present

## 2020-12-02 DIAGNOSIS — R0682 Tachypnea, not elsewhere classified: Secondary | ICD-10-CM

## 2020-12-02 DIAGNOSIS — J9602 Acute respiratory failure with hypercapnia: Secondary | ICD-10-CM | POA: Diagnosis present

## 2020-12-02 DIAGNOSIS — I462 Cardiac arrest due to underlying cardiac condition: Secondary | ICD-10-CM | POA: Diagnosis present

## 2020-12-02 DIAGNOSIS — B952 Enterococcus as the cause of diseases classified elsewhere: Secondary | ICD-10-CM | POA: Diagnosis present

## 2020-12-02 DIAGNOSIS — R680 Hypothermia, not associated with low environmental temperature: Secondary | ICD-10-CM | POA: Diagnosis present

## 2020-12-02 DIAGNOSIS — Z8709 Personal history of other diseases of the respiratory system: Secondary | ICD-10-CM

## 2020-12-02 DIAGNOSIS — E46 Unspecified protein-calorie malnutrition: Secondary | ICD-10-CM

## 2020-12-02 DIAGNOSIS — Z930 Tracheostomy status: Secondary | ICD-10-CM

## 2020-12-02 DIAGNOSIS — Z4659 Encounter for fitting and adjustment of other gastrointestinal appliance and device: Secondary | ICD-10-CM

## 2020-12-02 DIAGNOSIS — R1310 Dysphagia, unspecified: Secondary | ICD-10-CM | POA: Diagnosis present

## 2020-12-02 DIAGNOSIS — I10 Essential (primary) hypertension: Secondary | ICD-10-CM

## 2020-12-02 HISTORY — DX: Personal history of other diseases of the respiratory system: Z87.09

## 2020-12-02 HISTORY — DX: Cardiac arrest, cause unspecified: I46.9

## 2020-12-02 LAB — URINALYSIS, COMPLETE (UACMP) WITH MICROSCOPIC
Bilirubin Urine: NEGATIVE
Glucose, UA: >=500 mg/dL — AB
Ketones, ur: NEGATIVE mg/dL
Leukocytes,Ua: NEGATIVE
Nitrite: NEGATIVE
Protein, ur: 100 mg/dL — AB
RBC / HPF: >50 RBC/hpf — ABNORMAL HIGH (ref 0–5)
Specific Gravity, Urine: 1.005 (ref 1.005–1.030)
pH: 8 (ref 5.0–8.0)

## 2020-12-02 LAB — GLUCOSE, CAPILLARY
Glucose-Capillary: 240 mg/dL — ABNORMAL HIGH (ref 70–99)
Glucose-Capillary: 160 mg/dL — ABNORMAL HIGH (ref 70–99)
Glucose-Capillary: 139 mg/dL — ABNORMAL HIGH (ref 70–99)
Glucose-Capillary: 108 mg/dL — ABNORMAL HIGH (ref 70–99)
Glucose-Capillary: 122 mg/dL — ABNORMAL HIGH (ref 70–99)

## 2020-12-02 LAB — CBC WITH DIFFERENTIAL/PLATELET
Abs Immature Granulocytes: 0.14 10*3/uL — ABNORMAL HIGH (ref 0.00–0.07)
Basophils Absolute: 0.1 10*3/uL (ref 0.0–0.1)
Basophils Relative: 1 %
Eosinophils Absolute: 0.6 10*3/uL — ABNORMAL HIGH (ref 0.0–0.5)
Eosinophils Relative: 5 %
HCT: 33.3 % — ABNORMAL LOW (ref 36.0–46.0)
Hemoglobin: 9.7 g/dL — ABNORMAL LOW (ref 12.0–15.0)
Immature Granulocytes: 1 %
Lymphocytes Relative: 44 %
Lymphs Abs: 5.2 10*3/uL — ABNORMAL HIGH (ref 0.7–4.0)
MCH: 24.3 pg — ABNORMAL LOW (ref 26.0–34.0)
MCHC: 29.1 g/dL — ABNORMAL LOW (ref 30.0–36.0)
MCV: 83.5 fL (ref 80.0–100.0)
Monocytes Absolute: 0.4 10*3/uL (ref 0.1–1.0)
Monocytes Relative: 3 %
Neutro Abs: 5.5 10*3/uL (ref 1.7–7.7)
Neutrophils Relative %: 46 %
Platelets: 277 10*3/uL (ref 150–400)
RBC: 3.99 MIL/uL (ref 3.87–5.11)
RDW: 18.2 % — ABNORMAL HIGH (ref 11.5–15.5)
Smear Review: NORMAL
WBC: 11.9 10*3/uL — ABNORMAL HIGH (ref 4.0–10.5)
nRBC: 0.2 % (ref 0.0–0.2)

## 2020-12-02 LAB — COMPREHENSIVE METABOLIC PANEL
ALT: 120 U/L — ABNORMAL HIGH (ref 0–44)
AST: 138 U/L — ABNORMAL HIGH (ref 15–41)
Albumin: 3.2 g/dL — ABNORMAL LOW (ref 3.5–5.0)
Alkaline Phosphatase: 71 U/L (ref 38–126)
Anion gap: 21 — ABNORMAL HIGH (ref 5–15)
BUN: 7 mg/dL (ref 6–20)
CO2: 20 mmol/L — ABNORMAL LOW (ref 22–32)
Calcium: 8.5 mg/dL — ABNORMAL LOW (ref 8.9–10.3)
Chloride: 100 mmol/L (ref 98–111)
Creatinine, Ser: 1.01 mg/dL — ABNORMAL HIGH (ref 0.44–1.00)
GFR, Estimated: >60 mL/min (ref >60)
Glucose, Bld: 357 mg/dL — ABNORMAL HIGH (ref 70–99)
Potassium: 2.1 mmol/L — CL (ref 3.5–5.1)
Sodium: 141 mmol/L (ref 135–145)
Total Bilirubin: 0.6 mg/dL (ref 0.3–1.2)
Total Protein: 7.5 g/dL (ref 6.5–8.1)

## 2020-12-02 LAB — BLOOD GAS, ARTERIAL
Acid-Base Excess: 0 mmol/L (ref 0.0–2.0)
Bicarbonate: 25.9 mmol/L (ref 20.0–28.0)
FIO2: 0.4
MECHVT: 450 mL
O2 Saturation: 92.9 %
PEEP: 5 cmH2O
Patient temperature: 37
RATE: 15 resp/min
pCO2 arterial: 47 mmHg (ref 32.0–48.0)
pH, Arterial: 7.35 (ref 7.350–7.450)
pO2, Arterial: 70 mmHg — ABNORMAL LOW (ref 83.0–108.0)

## 2020-12-02 LAB — MRSA PCR SCREENING: MRSA by PCR: NEGATIVE

## 2020-12-02 LAB — RESP PANEL BY RT-PCR (FLU A&B, COVID) ARPGX2
Influenza A by PCR: NEGATIVE
Influenza B by PCR: NEGATIVE
SARS Coronavirus 2 by RT PCR: NEGATIVE

## 2020-12-02 LAB — BASIC METABOLIC PANEL
Anion gap: 17 — ABNORMAL HIGH (ref 5–15)
Anion gap: 16 — ABNORMAL HIGH (ref 5–15)
BUN: 8 mg/dL (ref 6–20)
BUN: 11 mg/dL (ref 6–20)
CO2: 22 mmol/L (ref 22–32)
CO2: 24 mmol/L (ref 22–32)
Calcium: 7.6 mg/dL — ABNORMAL LOW (ref 8.9–10.3)
Calcium: 7.8 mg/dL — ABNORMAL LOW (ref 8.9–10.3)
Chloride: 103 mmol/L (ref 98–111)
Chloride: 105 mmol/L (ref 98–111)
Creatinine, Ser: 0.92 mg/dL (ref 0.44–1.00)
Creatinine, Ser: 0.96 mg/dL (ref 0.44–1.00)
GFR, Estimated: >60 mL/min (ref >60)
GFR, Estimated: >60 mL/min (ref >60)
Glucose, Bld: 273 mg/dL — ABNORMAL HIGH (ref 70–99)
Glucose, Bld: 142 mg/dL — ABNORMAL HIGH (ref 70–99)
Potassium: 2.4 mmol/L — CL (ref 3.5–5.1)
Potassium: 3.8 mmol/L (ref 3.5–5.1)
Sodium: 142 mmol/L (ref 135–145)
Sodium: 145 mmol/L (ref 135–145)

## 2020-12-02 LAB — TROPONIN I (HIGH SENSITIVITY)
Troponin I (High Sensitivity): 71 ng/L — ABNORMAL HIGH (ref <18)
Troponin I (High Sensitivity): 252 ng/L
Troponin I (High Sensitivity): 947 ng/L (ref <18)

## 2020-12-02 LAB — URINE DRUG SCREEN, QUALITATIVE (ARMC ONLY)
Amphetamines, Ur Screen: NOT DETECTED
Barbiturates, Ur Screen: NOT DETECTED
Benzodiazepine, Ur Scrn: NOT DETECTED
Cannabinoid 50 Ng, Ur ~~LOC~~: NOT DETECTED
Cocaine Metabolite,Ur ~~LOC~~: NOT DETECTED
MDMA (Ecstasy)Ur Screen: NOT DETECTED
Methadone Scn, Ur: NOT DETECTED
Opiate, Ur Screen: NOT DETECTED
Phencyclidine (PCP) Ur S: NOT DETECTED
Tricyclic, Ur Screen: NOT DETECTED

## 2020-12-02 LAB — CBC
HCT: 33.7 % — ABNORMAL LOW (ref 36.0–46.0)
Hemoglobin: 9.7 g/dL — ABNORMAL LOW (ref 12.0–15.0)
MCH: 24.3 pg — ABNORMAL LOW (ref 26.0–34.0)
MCHC: 28.8 g/dL — ABNORMAL LOW (ref 30.0–36.0)
MCV: 84.5 fL (ref 80.0–100.0)
Platelets: 240 10*3/uL (ref 150–400)
RBC: 3.99 MIL/uL (ref 3.87–5.11)
RDW: 18.2 % — ABNORMAL HIGH (ref 11.5–15.5)
WBC: 14.8 10*3/uL — ABNORMAL HIGH (ref 4.0–10.5)
nRBC: 0 % (ref 0.0–0.2)

## 2020-12-02 LAB — SAMPLE TO BLOOD BANK

## 2020-12-02 LAB — PHOSPHORUS
Phosphorus: 5 mg/dL — ABNORMAL HIGH (ref 2.5–4.6)
Phosphorus: 2.4 mg/dL — ABNORMAL LOW (ref 2.5–4.6)

## 2020-12-02 LAB — APTT
aPTT: <24 seconds — ABNORMAL LOW (ref 24–36)
aPTT: <24 seconds — ABNORMAL LOW (ref 24–36)

## 2020-12-02 LAB — LACTIC ACID, PLASMA
Lactic Acid, Venous: 10 mmol/L (ref 0.5–1.9)
Lactic Acid, Venous: 5.1 mmol/L (ref 0.5–1.9)
Lactic Acid, Venous: 5.2 mmol/L (ref 0.5–1.9)

## 2020-12-02 LAB — MAGNESIUM
Magnesium: 2 mg/dL (ref 1.7–2.4)
Magnesium: 1.8 mg/dL (ref 1.7–2.4)

## 2020-12-02 LAB — HEMOGLOBIN A1C
Hgb A1c MFr Bld: 5.9 % — ABNORMAL HIGH (ref 4.8–5.6)
Mean Plasma Glucose: 122.63 mg/dL

## 2020-12-02 LAB — PROTIME-INR
INR: 1 (ref 0.8–1.2)
INR: 1 (ref 0.8–1.2)
Prothrombin Time: 12.5 seconds (ref 11.4–15.2)
Prothrombin Time: 13.2 seconds (ref 11.4–15.2)

## 2020-12-02 IMAGING — DX DG CHEST 1V PORT
1 series · 1 of 1 positions shown · non-contrast
Comparison: None.

CLINICAL DATA: Hypoxia

EXAM:
PORTABLE CHEST 1 VIEW

[chest ap]
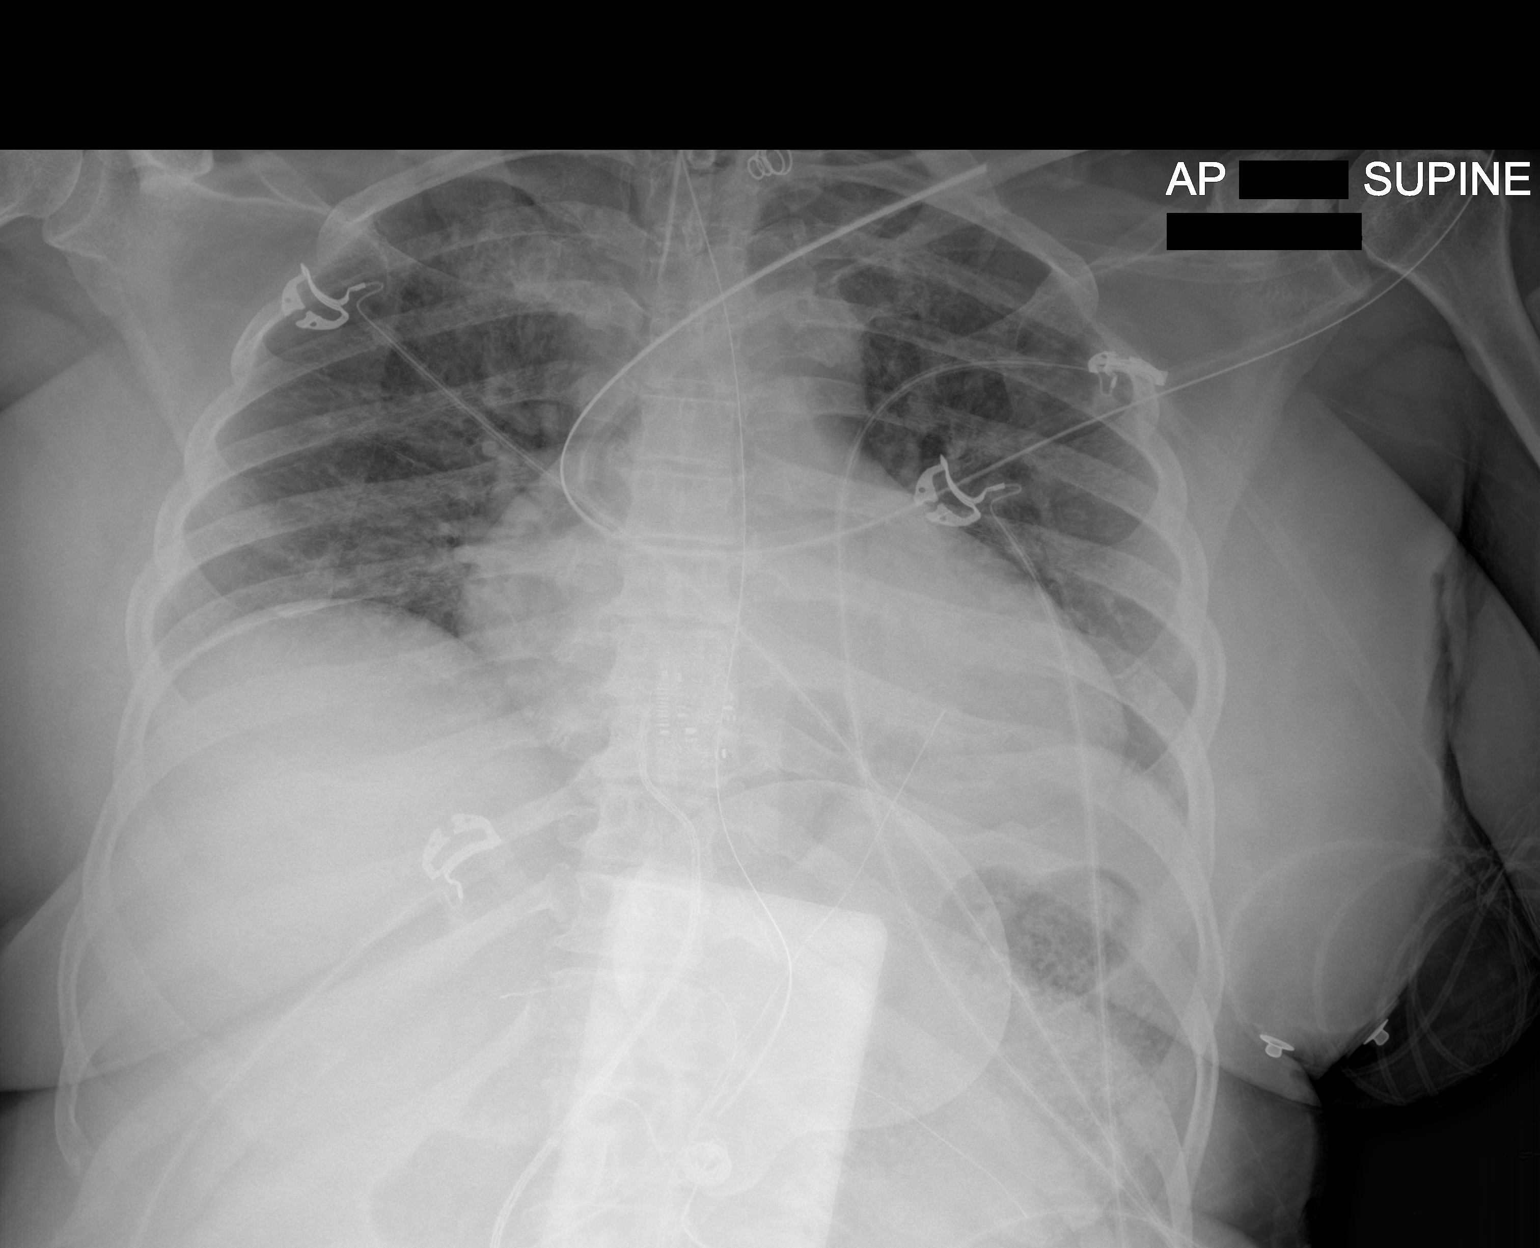

[1 of 1 positions shown; findings below may reference images not displayed]

FINDINGS: Endotracheal tube tip is 3.1 cm above the carina. Nasogastric tube
tip and side port are in the stomach. No pneumothorax. There is no
edema or airspace opacity. There is cardiomegaly with pulmonary
vascularity normal. No adenopathy. No bone lesions.
IMPRESSION: Tube positions as described without pneumothorax. No edema or
airspace opacity. There is cardiomegaly.

## 2020-12-02 IMAGING — CT CT HEAD W/O CM
4 series · 17 of 47 positions shown, 19 images · non-contrast
Comparison: None.

CLINICAL DATA: Cardiac arrest

EXAM:
CT HEAD WITHOUT CONTRAST
TECHNIQUE: Contiguous axial images were obtained from the base of the skull
through the vertex without intravenous contrast.

[Series 2: head wo · axial · 0.45mm/px · z∈[-117,-7]mm · 7 of 30 slices shown, 9 images]
[im 4/30  brain]
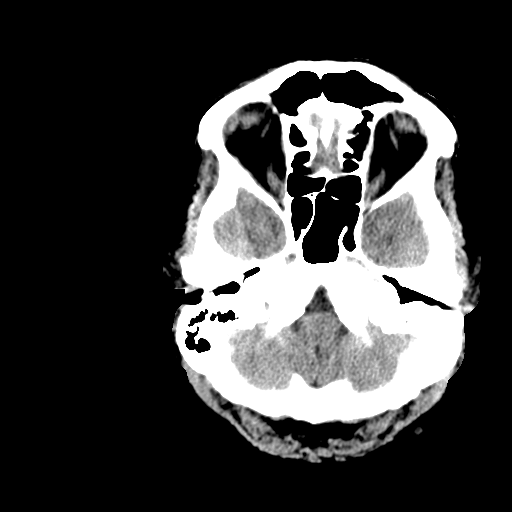
[im 4/30  bone]
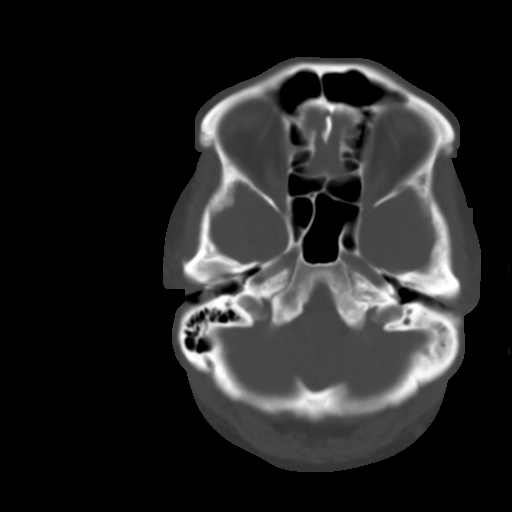
[im 8/30  brain]
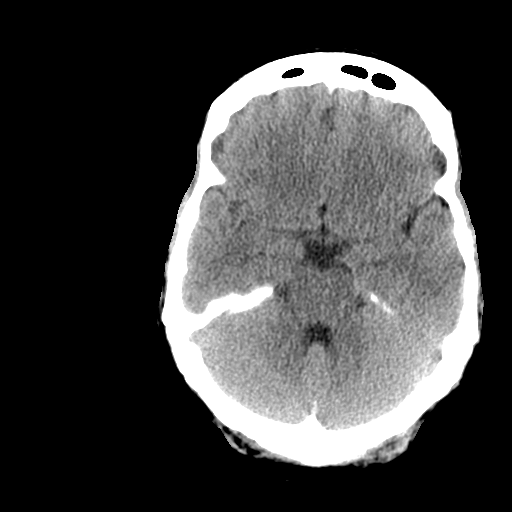
[im 11/30  brain]
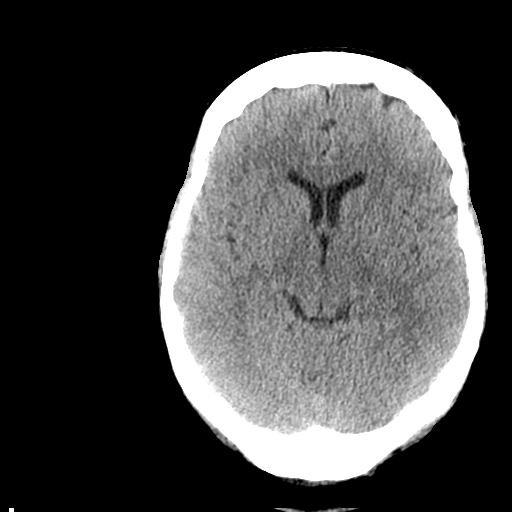
[im 15/30  brain]
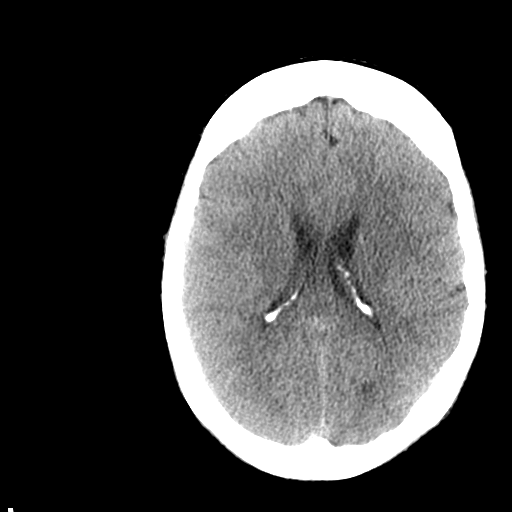
[im 19/30  brain]
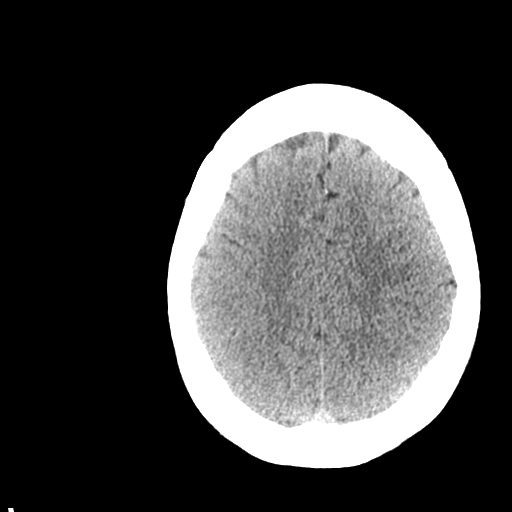
[im 19/30  bone]
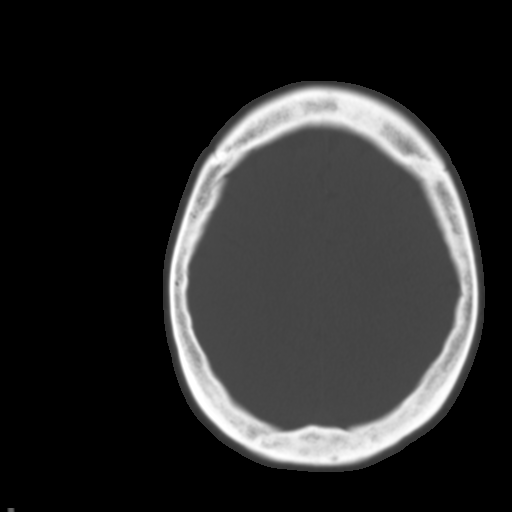
[im 22/30  brain]
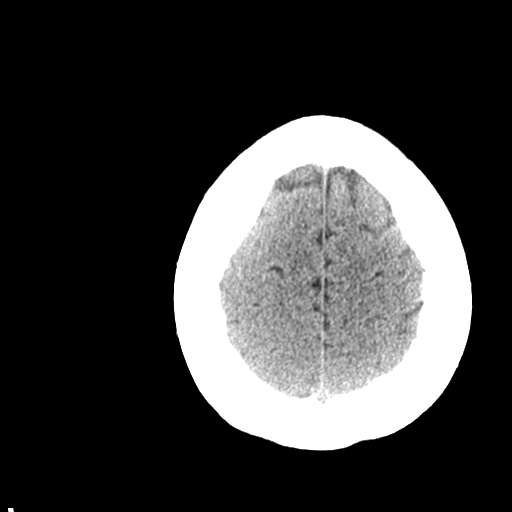
[im 26/30  brain]
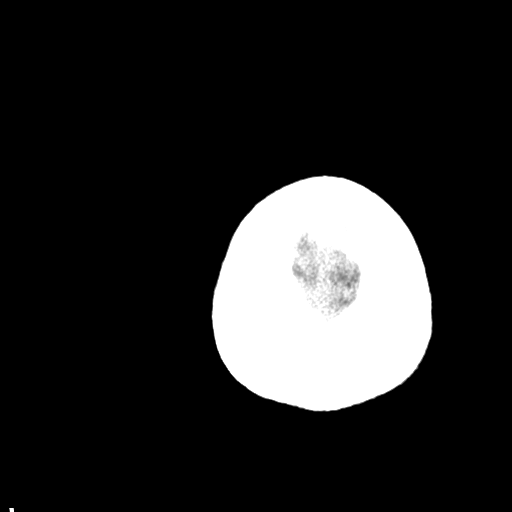

[Series 3: head bone · axial · 0.45mm/px · z∈[-118,-66]mm · 4 of 75 slices shown]
[im 8/75  bone]
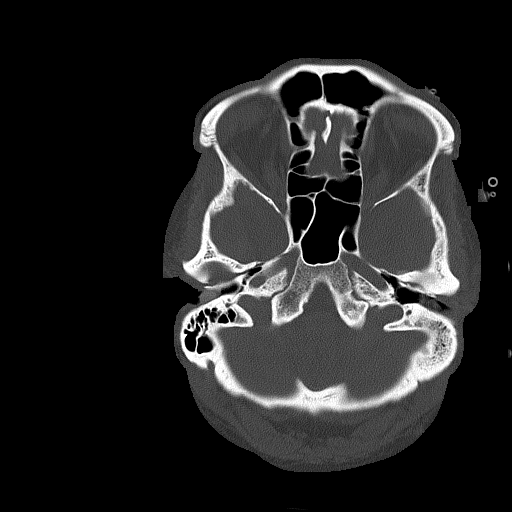
[im 15/75  bone]
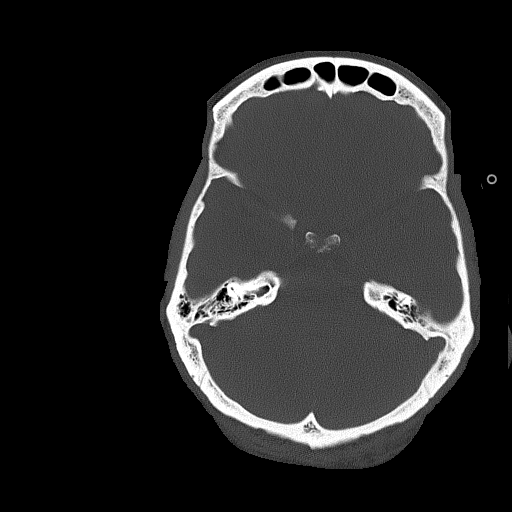
[im 23/75  bone]
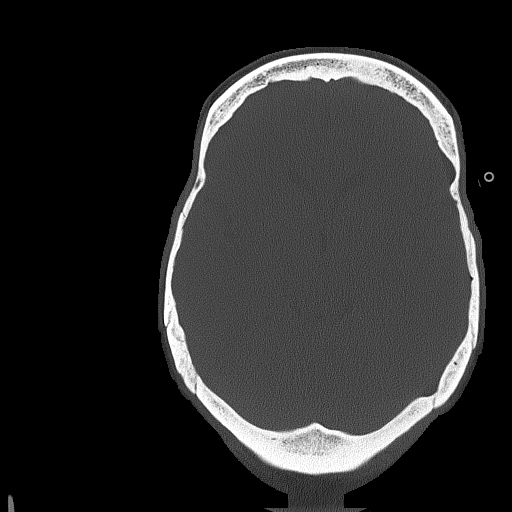
[im 34/75  bone]
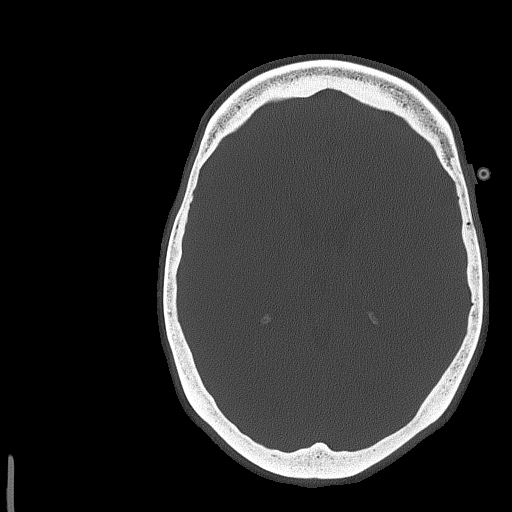

[Series 4: coronal soft tissue · coronal · 0.32mm/px · 3 of 70 slices shown]
[im 24/70  brain]
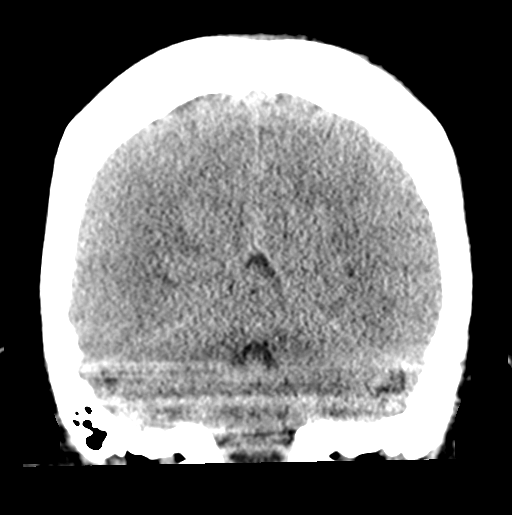
[im 31/70  brain]
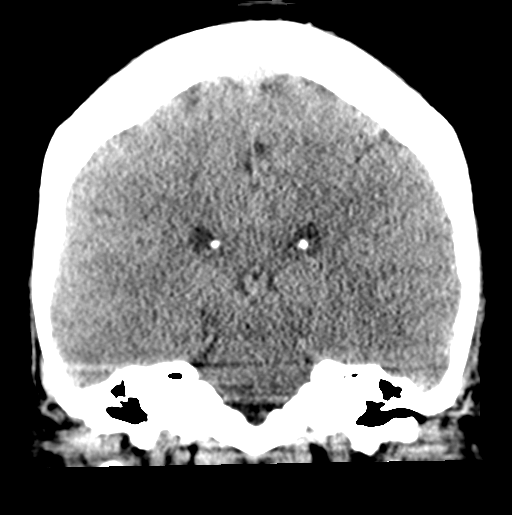
[im 39/70  brain]
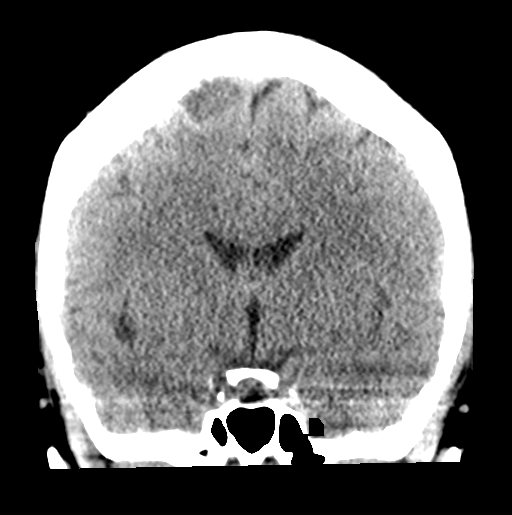

[Series 5: sagittal soft tissue · sagittal · 0.33mm/px · 3 of 55 slices shown]
[im 19/55  brain]
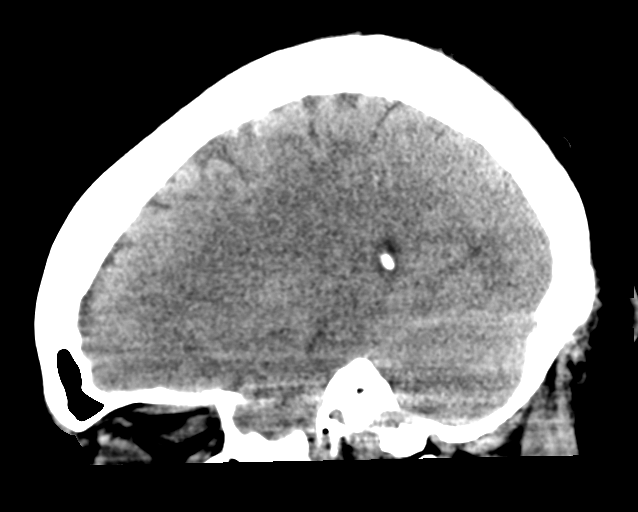
[im 28/55  brain]
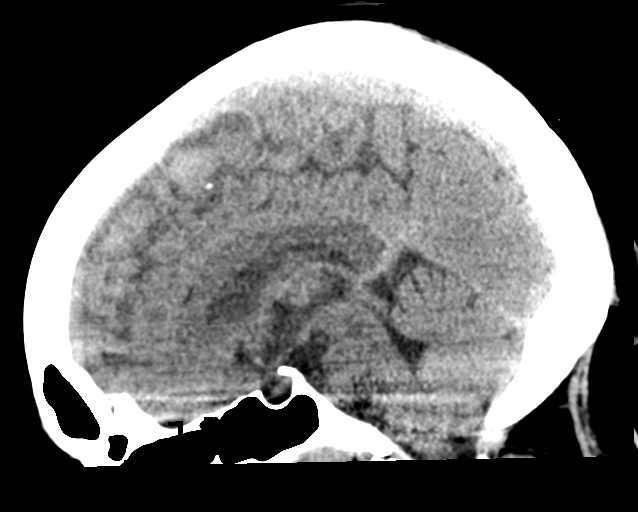
[im 37/55  brain]
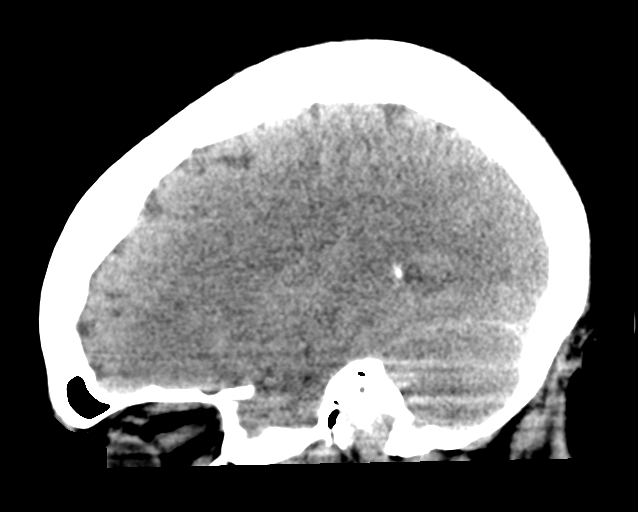

[17 of 47 positions shown; findings below may reference images not displayed]

FINDINGS: Brain: No evidence of acute infarction, hemorrhage, hydrocephalus,
extra-axial collection or mass lesion/mass effect.

Vascular: No hyperdense vessel or unexpected calcification.

Skull: Benign area of regional sclerosis in the upper right parietal
bone. Negative for fracture or visible swelling.

Sinuses/Orbits: Negative
IMPRESSION: No acute finding.  No visible anoxic injury.

## 2020-12-02 MED ORDER — NOREPINEPHRINE 4 MG/250ML-% IV SOLN
2.0000 ug/min | INTRAVENOUS | Status: DC
  Administered 2020-12-02: 2 ug/min via INTRAVENOUS

## 2020-12-02 MED ORDER — ROCURONIUM BROMIDE 50 MG/5ML IV SOLN
INTRAVENOUS | Status: AC | PRN
  Administered 2020-12-02: 70 mg via INTRAVENOUS

## 2020-12-02 MED ORDER — FENTANYL 2500MCG IN NS 250ML (10MCG/ML) PREMIX INFUSION
0.0000 ug/h | INTRAVENOUS | Status: DC
  Administered 2020-12-02: 25 ug/h via INTRAVENOUS
  Administered 2020-12-03: 200 ug/h via INTRAVENOUS
  Administered 2020-12-03: 150 ug/h via INTRAVENOUS
  Administered 2020-12-04: 100 ug/h via INTRAVENOUS
  Administered 2020-12-04 – 2020-12-08 (×4): 150 ug/h via INTRAVENOUS
  Administered 2020-12-09: 80 ug/h via INTRAVENOUS
  Administered 2020-12-09 – 2020-12-10 (×2): 200 ug/h via INTRAVENOUS
  Administered 2020-12-11: 225 ug/h via INTRAVENOUS
  Administered 2020-12-11 (×2): 300 ug/h via INTRAVENOUS
  Administered 2020-12-12: 350 ug/h via INTRAVENOUS
  Administered 2020-12-12 (×2): 300 ug/h via INTRAVENOUS
  Administered 2020-12-13: 350 ug/h via INTRAVENOUS
  Administered 2020-12-13: 250 ug/h via INTRAVENOUS
  Administered 2020-12-13: 200 ug/h via INTRAVENOUS
  Administered 2020-12-14 – 2020-12-15 (×3): 250 ug/h via INTRAVENOUS
  Administered 2020-12-15 – 2020-12-16 (×2): 200 ug/h via INTRAVENOUS
  Administered 2020-12-16: 50 ug/h via INTRAVENOUS
  Filled 2020-12-02 (×26): qty 250

## 2020-12-02 MED ORDER — POTASSIUM CHLORIDE 20 MEQ PO PACK
40.0000 meq | PACK | ORAL | Status: AC
  Administered 2020-12-02 (×2): 40 meq
  Filled 2020-12-02 (×2): qty 2

## 2020-12-02 MED ORDER — SODIUM CHLORIDE 0.9 % IV SOLN
250.0000 mL | INTRAVENOUS | Status: DC
  Administered 2020-12-03 – 2021-01-09 (×5): 250 mL via INTRAVENOUS

## 2020-12-02 MED ORDER — NOREPINEPHRINE 4 MG/250ML-% IV SOLN
0.0000 ug/min | INTRAVENOUS | Status: DC
  Filled 2020-12-02: qty 250

## 2020-12-02 MED ORDER — SODIUM CHLORIDE 0.9 % IV SOLN
1.0000 g | INTRAVENOUS | Status: DC
  Administered 2020-12-03 – 2020-12-04 (×2): 1 g via INTRAVENOUS
  Filled 2020-12-02: qty 1
  Filled 2020-12-02: qty 10
  Filled 2020-12-02: qty 1

## 2020-12-02 MED ORDER — VITAL HIGH PROTEIN PO LIQD: 1000.0000 mL | ORAL | Status: DC

## 2020-12-02 MED ORDER — INSULIN ASPART 100 UNIT/ML ~~LOC~~ SOLN
0.0000 [IU] | SUBCUTANEOUS | Status: DC
  Administered 2020-12-02: 3 [IU] via SUBCUTANEOUS
  Administered 2020-12-02 – 2020-12-07 (×13): 2 [IU] via SUBCUTANEOUS
  Administered 2020-12-07: 4 [IU] via SUBCUTANEOUS
  Administered 2020-12-07 – 2020-12-09 (×8): 2 [IU] via SUBCUTANEOUS
  Administered 2020-12-09: 3 [IU] via SUBCUTANEOUS
  Administered 2020-12-09 – 2020-12-10 (×3): 2 [IU] via SUBCUTANEOUS
  Administered 2020-12-10: 3 [IU] via SUBCUTANEOUS
  Administered 2020-12-10: 2 [IU] via SUBCUTANEOUS
  Administered 2020-12-10: 3 [IU] via SUBCUTANEOUS
  Administered 2020-12-10 (×2): 2 [IU] via SUBCUTANEOUS
  Administered 2020-12-11 (×2): 3 [IU] via SUBCUTANEOUS
  Administered 2020-12-11 (×2): 2 [IU] via SUBCUTANEOUS
  Administered 2020-12-11: 3 [IU] via SUBCUTANEOUS
  Administered 2020-12-11: 2 [IU] via SUBCUTANEOUS
  Administered 2020-12-11: 3 [IU] via SUBCUTANEOUS
  Administered 2020-12-12: 2 [IU] via SUBCUTANEOUS
  Administered 2020-12-12 – 2020-12-13 (×6): 3 [IU] via SUBCUTANEOUS
  Administered 2020-12-13: 2 [IU] via SUBCUTANEOUS
  Administered 2020-12-13: 3 [IU] via SUBCUTANEOUS
  Administered 2020-12-13 (×2): 2 [IU] via SUBCUTANEOUS
  Administered 2020-12-14 – 2020-12-15 (×9): 3 [IU] via SUBCUTANEOUS
  Administered 2020-12-15: 2 [IU] via SUBCUTANEOUS
  Administered 2020-12-15: 5 [IU] via SUBCUTANEOUS
  Administered 2020-12-16 (×2): 3 [IU] via SUBCUTANEOUS
  Administered 2020-12-16: 2 [IU] via SUBCUTANEOUS
  Administered 2020-12-16 (×2): 3 [IU] via SUBCUTANEOUS
  Administered 2020-12-16 – 2020-12-17 (×2): 2 [IU] via SUBCUTANEOUS
  Administered 2020-12-17 (×2): 3 [IU] via SUBCUTANEOUS
  Administered 2020-12-17: 2 [IU] via SUBCUTANEOUS
  Administered 2020-12-17: 3 [IU] via SUBCUTANEOUS
  Administered 2020-12-17: 2 [IU] via SUBCUTANEOUS
  Administered 2020-12-18 (×3): 3 [IU] via SUBCUTANEOUS
  Administered 2020-12-18: 2 [IU] via SUBCUTANEOUS
  Administered 2020-12-18 (×2): 3 [IU] via SUBCUTANEOUS
  Administered 2020-12-19: 2 [IU] via SUBCUTANEOUS
  Administered 2020-12-19: 3 [IU] via SUBCUTANEOUS
  Administered 2020-12-19: 2 [IU] via SUBCUTANEOUS
  Administered 2020-12-19: 3 [IU] via SUBCUTANEOUS
  Administered 2020-12-19: 2 [IU] via SUBCUTANEOUS
  Administered 2020-12-19: 3 [IU] via SUBCUTANEOUS
  Administered 2020-12-20: 2 [IU] via SUBCUTANEOUS
  Administered 2020-12-20: 3 [IU] via SUBCUTANEOUS
  Administered 2020-12-20: 2 [IU] via SUBCUTANEOUS
  Administered 2020-12-20: 4 [IU] via SUBCUTANEOUS
  Administered 2020-12-20: 2 [IU] via SUBCUTANEOUS
  Administered 2020-12-20: 3 [IU] via SUBCUTANEOUS
  Administered 2020-12-20: 2 [IU] via SUBCUTANEOUS
  Administered 2020-12-21: 3 [IU] via SUBCUTANEOUS
  Administered 2020-12-21 (×2): 2 [IU] via SUBCUTANEOUS
  Administered 2020-12-21: 3 [IU] via SUBCUTANEOUS
  Administered 2020-12-21 (×2): 2 [IU] via SUBCUTANEOUS
  Administered 2020-12-22: 3 [IU] via SUBCUTANEOUS
  Administered 2020-12-22 (×3): 2 [IU] via SUBCUTANEOUS
  Administered 2020-12-23: 3 [IU] via SUBCUTANEOUS
  Administered 2020-12-23 (×3): 2 [IU] via SUBCUTANEOUS
  Administered 2020-12-23: 3 [IU] via SUBCUTANEOUS
  Administered 2020-12-23 – 2020-12-24 (×6): 2 [IU] via SUBCUTANEOUS
  Administered 2020-12-24 – 2020-12-25 (×2): 3 [IU] via SUBCUTANEOUS
  Administered 2020-12-25: 2 [IU] via SUBCUTANEOUS
  Administered 2020-12-25: 3 [IU] via SUBCUTANEOUS
  Administered 2020-12-25 (×2): 2 [IU] via SUBCUTANEOUS
  Administered 2020-12-26: 3 [IU] via SUBCUTANEOUS
  Administered 2020-12-26 (×3): 2 [IU] via SUBCUTANEOUS
  Administered 2020-12-26 (×2): 3 [IU] via SUBCUTANEOUS
  Administered 2020-12-27 (×4): 2 [IU] via SUBCUTANEOUS
  Administered 2020-12-28: 3 [IU] via SUBCUTANEOUS
  Administered 2020-12-28: 2 [IU] via SUBCUTANEOUS
  Administered 2020-12-28: 3 [IU] via SUBCUTANEOUS
  Administered 2020-12-28 – 2020-12-29 (×3): 2 [IU] via SUBCUTANEOUS
  Administered 2020-12-29 – 2020-12-30 (×3): 3 [IU] via SUBCUTANEOUS
  Administered 2020-12-30 (×2): 2 [IU] via SUBCUTANEOUS
  Administered 2020-12-30: 3 [IU] via SUBCUTANEOUS
  Administered 2020-12-31 (×2): 2 [IU] via SUBCUTANEOUS
  Administered 2020-12-31: 3 [IU] via SUBCUTANEOUS
  Administered 2021-01-01: 2 [IU] via SUBCUTANEOUS
  Administered 2021-01-01: 3 [IU] via SUBCUTANEOUS
  Administered 2021-01-01 – 2021-01-02 (×4): 2 [IU] via SUBCUTANEOUS
  Administered 2021-01-02 (×2): 3 [IU] via SUBCUTANEOUS
  Administered 2021-01-02 (×2): 2 [IU] via SUBCUTANEOUS
  Administered 2021-01-02 – 2021-01-03 (×2): 3 [IU] via SUBCUTANEOUS
  Administered 2021-01-03: 2 [IU] via SUBCUTANEOUS
  Administered 2021-01-03: 3 [IU] via SUBCUTANEOUS
  Administered 2021-01-03 (×3): 2 [IU] via SUBCUTANEOUS
  Administered 2021-01-04: 3 [IU] via SUBCUTANEOUS
  Administered 2021-01-04 (×3): 2 [IU] via SUBCUTANEOUS
  Administered 2021-01-04: 3 [IU] via SUBCUTANEOUS
  Administered 2021-01-05 – 2021-01-07 (×8): 2 [IU] via SUBCUTANEOUS
  Administered 2021-01-07 (×2): 3 [IU] via SUBCUTANEOUS
  Administered 2021-01-07 – 2021-01-09 (×9): 2 [IU] via SUBCUTANEOUS
  Administered 2021-01-09: 3 [IU] via SUBCUTANEOUS
  Administered 2021-01-09: 2 [IU] via SUBCUTANEOUS
  Filled 2020-12-02 (×180): qty 1

## 2020-12-02 MED ORDER — SODIUM CHLORIDE 0.9 % IV SOLN
1.0000 g | Freq: Once | INTRAVENOUS | Status: AC
  Administered 2020-12-02: 1 g via INTRAVENOUS
  Filled 2020-12-02: qty 10

## 2020-12-02 MED ORDER — MAGNESIUM SULFATE 2 GM/50ML IV SOLN
2.0000 g | Freq: Once | INTRAVENOUS | Status: AC
  Administered 2020-12-02: 2 g via INTRAVENOUS
  Filled 2020-12-02: qty 50

## 2020-12-02 MED ORDER — ASPIRIN 300 MG RE SUPP: 300.0000 mg | RECTAL | Status: DC

## 2020-12-02 MED ORDER — LABETALOL HCL 5 MG/ML IV SOLN
10.0000 mg | Freq: Once | INTRAVENOUS | Status: AC
  Administered 2020-12-02: 10 mg via INTRAVENOUS
  Filled 2020-12-02: qty 4

## 2020-12-02 MED ORDER — ADULT MULTIVITAMIN W/MINERALS CH
1.0000 | ORAL_TABLET | Freq: Every day | ORAL | Status: DC
  Administered 2020-12-03 – 2020-12-31 (×28): 1
  Filled 2020-12-02 (×28): qty 1

## 2020-12-02 MED ORDER — MIDAZOLAM HCL 2 MG/2ML IJ SOLN
2.0000 mg | INTRAMUSCULAR | Status: DC | PRN
  Administered 2020-12-03 – 2020-12-08 (×11): 2 mg via INTRAVENOUS
  Filled 2020-12-02 (×11): qty 2

## 2020-12-02 MED ORDER — PROPOFOL 1000 MG/100ML IV EMUL
5.0000 ug/kg/min | INTRAVENOUS | Status: DC
  Administered 2020-12-02: 20 ug/kg/min via INTRAVENOUS
  Administered 2020-12-02: 15 ug/kg/min via INTRAVENOUS
  Administered 2020-12-02: 50 ug/kg/min via INTRAVENOUS
  Administered 2020-12-03 (×2): 30 ug/kg/min via INTRAVENOUS
  Administered 2020-12-03: 25 ug/kg/min via INTRAVENOUS
  Administered 2020-12-04: 40 ug/kg/min via INTRAVENOUS
  Administered 2020-12-04 (×2): 30 ug/kg/min via INTRAVENOUS
  Administered 2020-12-05: 35 ug/kg/min via INTRAVENOUS
  Administered 2020-12-05: 30 ug/kg/min via INTRAVENOUS
  Administered 2020-12-05: 25 ug/kg/min via INTRAVENOUS
  Administered 2020-12-05: 30 ug/kg/min via INTRAVENOUS
  Administered 2020-12-06 (×2): 35 ug/kg/min via INTRAVENOUS
  Filled 2020-12-02 (×13): qty 100

## 2020-12-02 MED ORDER — SODIUM CHLORIDE 0.9 % IV BOLUS (SEPSIS)
1000.0000 mL | Freq: Once | INTRAVENOUS | Status: AC
  Administered 2020-12-02: 1000 mL via INTRAVENOUS

## 2020-12-02 MED ORDER — ORAL CARE MOUTH RINSE
15.0000 mL | OROMUCOSAL | Status: DC
  Administered 2020-12-02 – 2021-01-10 (×351): 15 mL via OROMUCOSAL

## 2020-12-02 MED ORDER — PROPOFOL 1000 MG/100ML IV EMUL: 5.0000 ug/kg/min | INTRAVENOUS | Status: DC

## 2020-12-02 MED ORDER — SODIUM CHLORIDE 0.9 % IV SOLN: INTRAVENOUS | Status: DC

## 2020-12-02 MED ORDER — MIDAZOLAM HCL 2 MG/2ML IJ SOLN
INTRAMUSCULAR | Status: AC
  Administered 2020-12-02: 2 mg via INTRAVENOUS
  Filled 2020-12-02: qty 2

## 2020-12-02 MED ORDER — CHLORHEXIDINE GLUCONATE CLOTH 2 % EX PADS
6.0000 | MEDICATED_PAD | Freq: Every day | CUTANEOUS | Status: DC
  Administered 2020-12-03 – 2021-01-09 (×34): 6 via TOPICAL

## 2020-12-02 MED ORDER — CHLORHEXIDINE GLUCONATE 0.12% ORAL RINSE (MEDLINE KIT)
15.0000 mL | Freq: Two times a day (BID) | OROMUCOSAL | Status: DC
  Administered 2020-12-02 – 2021-01-10 (×76): 15 mL via OROMUCOSAL

## 2020-12-02 MED ORDER — PROPOFOL 1000 MG/100ML IV EMUL
INTRAVENOUS | Status: AC
  Administered 2020-12-02: 10 ug/kg/min via INTRAVENOUS
  Filled 2020-12-02: qty 100

## 2020-12-02 MED ORDER — POTASSIUM CHLORIDE 10 MEQ/100ML IV SOLN
10.0000 meq | INTRAVENOUS | Status: AC
  Administered 2020-12-02 (×4): 10 meq via INTRAVENOUS
  Filled 2020-12-02 (×4): qty 100

## 2020-12-02 MED ORDER — POTASSIUM & SODIUM PHOSPHATES 280-160-250 MG PO PACK
2.0000 | PACK | Freq: Once | ORAL | Status: AC
  Administered 2020-12-02: 2
  Filled 2020-12-02: qty 2

## 2020-12-02 MED ORDER — VITAL AF 1.2 CAL PO LIQD
1000.0000 mL | ORAL | Status: DC
  Administered 2020-12-02: 1000 mL

## 2020-12-02 MED ORDER — FENTANYL BOLUS VIA INFUSION
50.0000 ug | INTRAVENOUS | Status: DC | PRN
  Administered 2020-12-03 – 2020-12-13 (×9): 50 ug via INTRAVENOUS
  Filled 2020-12-02: qty 50

## 2020-12-02 MED ORDER — ASPIRIN 81 MG PO CHEW
324.0000 mg | CHEWABLE_TABLET | Freq: Once | ORAL | Status: AC
  Administered 2020-12-02: 324 mg
  Filled 2020-12-02: qty 4

## 2020-12-02 MED ORDER — PANTOPRAZOLE SODIUM 40 MG IV SOLR
40.0000 mg | Freq: Every day | INTRAVENOUS | Status: DC
  Administered 2020-12-02 – 2020-12-13 (×12): 40 mg via INTRAVENOUS
  Filled 2020-12-02 (×12): qty 40

## 2020-12-02 MED ORDER — HEPARIN SODIUM (PORCINE) 5000 UNIT/ML IJ SOLN
5000.0000 [IU] | Freq: Three times a day (TID) | INTRAMUSCULAR | Status: DC
  Administered 2020-12-02 – 2020-12-12 (×31): 5000 [IU] via SUBCUTANEOUS
  Filled 2020-12-02 (×32): qty 1

## 2020-12-02 MED ORDER — SODIUM CHLORIDE 0.9 % IV SOLN
INTRAVENOUS | Status: AC | PRN
  Administered 2020-12-02: 1000 mL via INTRAVENOUS

## 2020-12-02 NOTE — ED Notes (Signed)
Date and time results received: 12/02/20 8:13 AM   Test: Lactic/Potassium Critical Value: 10.1/2.1  Name of Provider Notified: Cherylann Banas, MD  Orders Received? Or Actions Taken?: see orders

## 2020-12-02 NOTE — Progress Notes (Signed)
[facility] for Electrolyte Monitoring and Replacement   Recent Labs: Potassium (mmol/L)  Date Value  12/02/2020 3.8  02/20/2013 3.4 (L)   Magnesium (mg/dL)  Date Value  12/02/2020 1.8   Calcium (mg/dL)  Date Value  12/02/2020 7.8 (L)   Calcium, Total (mg/dL)  Date Value  02/20/2013 8.7   Albumin (g/dL)  Date Value  12/02/2020 3.2 (L)  02/20/2013 3.2 (L)   Phosphorus (mg/dL)  Date Value  12/02/2020 2.4 (L)   Sodium (mmol/L)  Date Value  12/02/2020 145  02/20/2013 137     Assessment: 55 year old female presented to the ED via EMS post witnessed cardiac arrest. ROSC achieved following 3 defibrillations and one round of epinephrine. Patient found to have significant hypokalemia on arrival. Given witnessed arrest, patient Code ICE. Pharmacy to manage electrolytes.    MIVF: NS at 50 mL/hr  Goal of Therapy:  Electrolytes WNL, K ~ 4 and Mg ~ 2 given cardiac events  Plan:  1/10 PM labs: K 3.8, Mg 1.8, Phos 2.4 --Will order Phos-Nak 2 packets x 1 dose --Will order IV magnesium sulfate 2 g x 1 --Re-check all electrolytes tomorrow AM  Benita Gutter 12/02/2020 6:30 PM

## 2020-12-02 NOTE — Consult Note (Signed)
CODE SEPSIS - PHARMACY COMMUNICATION  **Broad Spectrum Antibiotics should be administered within 1 hour of Sepsis diagnosis**  Time Code Sepsis Called/Page Received: 3382  Antibiotics Ordered: ceftriaxone  Time of 1st antibiotic administration: 0837  Additional action taken by pharmacy: none  If necessary, Name of Provider/Nurse Contacted: [place] ,PharmD Clinical Pharmacist  12/02/2020  8:56 AM

## 2020-12-02 NOTE — Progress Notes (Signed)
   12/02/20 1420  Clinical Encounter Type  Visited With Patient and family together  Visit Type Initial;Spiritual support;Social support  Referral From Nurse  Consult/Referral To [place] responded an OR in ICU.   Prayer was requested and given to patient and husband. I advised the husband if more spiritual care is needed he can freely ask the nurses or doctors to contact the spiritual care department.

## 2020-12-02 NOTE — Plan of Care (Signed)
Pt in process of cooling

## 2020-12-02 NOTE — H&P (Signed)
Name: ROBI MITTER MRN: [id number] DOB: [birth year]     CONSULTATION DATE: 12/02/2020  REFERRING MD : Jodell Cipro  CHIEF COMPLAINT: cardiac arrest  HISTORY OF PRESENT ILLNESS:   [name] with acute cardiac arrest Labs c/w severe acidosis and severe DKA Patient with witnessed V fib arrest and shocked in the field ROSC returned is unknown  Patient now intubated on MV support  ER Course S/p cardiac arrest Severe LA severe DKA On Vent UA c/w UTI  PAST MEDICAL HISTORY :   has a past medical history of Hypertension.  has no past surgical history on file. Prior to Admission medications   Medication Sig Start Date End Date Taking? Authorizing Provider  chlorthalidone (HYGROTON) 25 MG tablet Take 25 mg by mouth daily.    [provider]  fexofenadine (ALLEGRA) 180 MG tablet Take 180 mg by mouth daily.    [provider]  ketoconazole (NIZORAL) 2 % cream Apply 1 application topically daily. For 2 weeks 04/13/18   Marylene Land, NP  mupirocin ointment (BACTROBAN) 2 % Apply two times a day for 7 days. 04/13/18   Marylene Land, NP  potassium chloride SA (K-DUR,KLOR-CON) 20 MEQ tablet Take 1 tablet (20 mEq total) by mouth 2 (two) times daily. 10/24/17   Schuyler Amor, MD   No Known Allergies  FAMILY HISTORY:  family history includes Diabetes in her mother; Healthy in her father; Hypertension in her mother; Leukemia in her mother. SOCIAL HISTORY:  reports that she quit smoking about 15 years ago. She has never used smokeless tobacco. She reports that she does not drink alcohol and does not use drugs.  REVIEW OF SYSTEMS:   Unable to obtain due to critical illness      Estimated body mass index is 37.49 kg/m as calculated from the following:   Height as of this encounter: 5\' 1"  (1.549 m).   Weight as of this encounter: 90 kg.    VITAL SIGNS: Temp:  [96.3 F (35.7 C)-96.7 F (35.9 C)] 96.3 F (35.7 C) (01/10 0830) Pulse Rate:  [58-109] 67 (01/10  0830) Resp:  [18-32] 24 (01/10 0830) BP: (162-230)/(102-163) 162/102 (01/10 0830) SpO2:  [96 %-100 %] 97 % (01/10 0830) FiO2 (%):  [50 %-100 %] 50 % (01/10 0730) Weight:  [90 kg] 90 kg (01/10 0844)   No intake/output data recorded. No intake/output data recorded.   SpO2: 97 % FiO2 (%): 50 %   Physical Examination:  GENERAL:critically ill appearing, +resp distress HEAD: Normocephalic, atraumatic.  EYES: Pupils equal, round, reactive to light.  No scleral icterus.  MOUTH: Moist mucosal membrane. NECK: Supple. No JVD.  PULMONARY: +rhonchi, +wheezing CARDIOVASCULAR: S1 and S2. Regular rate and rhythm. No murmurs, rubs, or gallops.  GASTROINTESTINAL: Soft, nontender, -distended.  Positive bowel sounds.  MUSCULOSKELETAL: No swelling, clubbing, or edema.  NEUROLOGIC: obtunded GCS <3 SKIN:intact,warm,dry    MEDICATIONS: I have reviewed all medications and confirmed regimen as documented   CULTURE RESULTS   Recent Results (from the past 240 hour(s))  Resp Panel by RT-PCR (Flu A&B, Covid) Nasopharyngeal Swab     Status: None   Collection Time: 12/02/20  7:23 AM   Specimen: Nasopharyngeal Swab; Nasopharyngeal(NP) swabs in vial transport medium  Result Value Ref Range Status   SARS Coronavirus 2 by RT PCR NEGATIVE NEGATIVE Final    Comment: (NOTE) SARS-CoV-2 target nucleic acids are NOT DETECTED.  The SARS-CoV-2 RNA is generally detectable in upper respiratory specimens during the acute phase of infection. The  lowest concentration of SARS-CoV-2 viral copies this assay can detect is 138 copies/mL. A negative result does not preclude SARS-Cov-2 infection and should not be used as the sole basis for treatment or other patient management decisions. A negative result may occur with  improper specimen collection/handling, submission of specimen other than nasopharyngeal swab, presence of viral mutation(s) within the areas targeted by this assay, and inadequate number of  viral copies(<138 copies/mL). A negative result must be combined with clinical observations, patient history, and epidemiological information. The expected result is Negative.  Fact Sheet for Patients:  EntrepreneurPulse.com.au  Fact Sheet for Healthcare Providers:  IncredibleEmployment.be  This test is no t yet approved or cleared by the [place] FDA and  has been authorized for detection and/or diagnosis of SARS-CoV-2 by FDA under an Emergency Use Authorization (EUA). This EUA will remain  in effect (meaning this test can be used) for the duration of the COVID-19 declaration under Section 564(b)(1) of the Act, 21 U.S.C.section 360bbb-3(b)(1), unless the authorization is terminated  or revoked sooner.       Influenza A by PCR NEGATIVE NEGATIVE Final   Influenza B by PCR NEGATIVE NEGATIVE Final    Comment: (NOTE) The Xpert Xpress SARS-CoV-2/FLU/RSV plus assay is intended as an aid in the diagnosis of influenza from Nasopharyngeal swab specimens and should not be used as a sole basis for treatment. Nasal washings and aspirates are unacceptable for Xpert Xpress SARS-CoV-2/FLU/RSV testing.  Fact Sheet for Patients: EntrepreneurPulse.com.au  Fact Sheet for Healthcare Providers: IncredibleEmployment.be  This test is not yet approved or cleared by the [place] FDA and has been authorized for detection and/or diagnosis of SARS-CoV-2 by FDA under an Emergency Use Authorization (EUA). This EUA will remain in effect (meaning this test can be used) for the duration of the COVID-19 declaration under Section 564(b)(1) of the Act, 21 U.S.C. section 360bbb-3(b)(1), unless the authorization is terminated or revoked.  Performed at [facility], [address] [place], [place] [address]           IMAGING    CT Head Wo Contrast  Result Date: 12/02/2020 CLINICAL DATA:  Cardiac arrest EXAM:  CT HEAD WITHOUT CONTRAST TECHNIQUE: Contiguous axial images were obtained from the base of the skull through the vertex without intravenous contrast. COMPARISON:  None. FINDINGS: Brain: No evidence of acute infarction, hemorrhage, hydrocephalus, extra-axial collection or mass lesion/mass effect. Vascular: No hyperdense vessel or unexpected calcification. Skull: Benign area of regional sclerosis in the upper right parietal bone. Negative for fracture or visible swelling. Sinuses/Orbits: Negative IMPRESSION: No acute finding.  No visible anoxic injury. Electronically Signed   By: Monte Fantasia M.D.   On: 12/02/2020 07:50   DG Chest Portable 1 View  Result Date: 12/02/2020 CLINICAL DATA:  Hypoxia EXAM: PORTABLE CHEST 1 VIEW COMPARISON:  None. FINDINGS: Endotracheal tube tip is 3.1 cm above the carina. Nasogastric tube tip and side port are in the stomach. No pneumothorax. There is no edema or airspace opacity. There is cardiomegaly with pulmonary vascularity normal. No adenopathy. No bone lesions. IMPRESSION: Tube positions as described without pneumothorax. No edema or airspace opacity. There is cardiomegaly. Electronically Signed   By: Lowella Grip III M.D.   On: 12/02/2020 07:59     Nutrition Status:           Indwelling Urinary Catheter continued, requirement due to   Reason to continue Indwelling Urinary Catheter strict Intake/Output monitoring for hemodynamic instability  Ventilator continued, requirement due to severe respiratory failure   Ventilator Sedation RASS 0 to -2      ASSESSMENT AND PLAN SYNOPSIS  55 yo obese [name] with acute Resp hypoxic resp failure due to severe cardiac arrest due to severe acidosis from DKA Will plan for HYPOTHERMIA PROTOCOL  Severe ACUTE Hypoxic and Hypercapnic Respiratory Failure -continue Full MV support -continue Bronchodilator Therapy -Wean Fio2 and PEEP as tolerated -VAP/VENT bundle implementation  ACUTE  CARDIAC FAILURE-  ECHO pending -oxygen as needed   Morbid obesity, possible OSA.   Will certainly impact respiratory mechanics, ventilator weaning Suspect will need to consider additional PEEP   ACUTE KIDNEY INJURY/Renal Failure -continue Foley Catheter-assess need -Avoid nephrotoxic agents -Follow urine output, BMP -Ensure adequate renal perfusion, optimize oxygenation -Renal dose medications     NEUROLOGY Acute toxic metabolic encephalopathy, need for sedation Goal RASS -2 to -3 Plan for Hypothermia Protocol   CARDIAC ICU monitoring  ID -continue IV abx as prescibed -follow up cultures  GI GI PROPHYLAXIS as indicated  NUTRITIONAL STATUS DIET-->TF's as tolerated Constipation protocol as indicated   ENDO - will use ICU hypoglycemic\Hyperglycemia protocol if needed    ELECTROLYTES -follow labs as needed -replace as needed -pharmacy consultation and following    DVT/GI PRX ordered and assessed TRANSFUSIONS AS NEEDED MONITOR FSBS I Assessed the need for Labs I Assessed the need for Foley I Assessed the need for Central Venous Line Family Discussion when available I Assessed the need for Mobilization I made an Assessment of medications to be adjusted accordingly Safety Risk assessment Completed  CASE DISCUSSED IN MULTIDISCIPLINARY ROUNDS WITH ICU TEAM   Critical Care Time devoted to patient care services described in this note is 65  minutes.   Overall, patient is critically ill, prognosis is guarded.  Patient with Multiorgan failure and at high risk for cardiac arrest and death.    Corrin Parker, M.D.  [facility]  Medical Director [place] Director [facility] Cardio-Pulmonary Department

## 2020-12-02 NOTE — Progress Notes (Signed)
eeg done °

## 2020-12-02 NOTE — Progress Notes (Signed)
Pt was transported to CT and back to ED and to CCu from the ED while on the vent.

## 2020-12-02 NOTE — ED Triage Notes (Addendum)
Witnessed cardiac arrest from home. Per ems received 2 shocks by firedepartment prior to ems arrival, ems arrived, pt in vfib gave one shock and one round of epi with ROSC. Pt with IO on left tibia. King airway in place, per ems fsbs 166.

## 2020-12-02 NOTE — Procedures (Signed)
Patient Name: [name] [name]  MRN: [id number]  Epilepsy Attending: Lora Havens  Referring Physician/Provider: Dr Flora Lipps Date: 12/02/2020 Duration: 22.29 mins  Patient history: 55yo F s/p cardiac arrest. EEG to evaluate for seizure  Level of alertness:  comatose  AEDs during EEG study: Propofol  Technical aspects: This EEG study was done with scalp electrodes positioned according to the 10-20 International system of electrode placement. Electrical activity was acquired at a sampling rate of 500Hz  and reviewed with a high frequency filter of 70Hz  and a low frequency filter of 1Hz . EEG data were recorded continuously and digitally stored.   Description: EEG showed continuous generalized 2-3hz  theta slowing admixed with 15-18hz  generalized beta activity. EEG was reactive to tactile stimulation. Hyperventilation and photic stimulation were not performed.     Of note parts of study were difficult to interpret due to myogenic artifact.   ABNORMALITY -Continuous  slow, generalized  IMPRESSION: This study is suggestive of severe diffuse encephalopathy, nonspecific etiology but likely related to sedation, anoxic/hypoxic brain injury. No seizures or epileptiform discharges were seen throughout the recording.  Bridgid Printz Barbra Sarks

## 2020-12-02 NOTE — Progress Notes (Signed)
Elink monitoring code sepsis 

## 2020-12-02 NOTE — Progress Notes (Signed)
CRITICAL VALUE ALERT  Critical Value:  Lactic Acid 5.1, Potassium 2.4, Troponin 252  Date & Time Notied:  12/02/2020 10:30  Provider Notified: Dr. Mortimer Fries  Orders Received/Actions taken: see new orders

## 2020-12-02 NOTE — Progress Notes (Addendum)
Initial Nutrition Assessment  DOCUMENTATION CODES:   Obesity unspecified  INTERVENTION:  Initiate Vital AF 1.2 Cal at trickle rate of 20 mL/hr today.  Tomorrow if electrolytes are within acceptable range advance to goal regimen of Vital AF 1.2 Cal at 40 mL/hr (960 mL goal daily volume) + PROSource TF 45 mL BID. Provides 1232 kcal, 94 grams of protein, 778 mL H2O daily. With current propofol rate provides 1517 kcal daily.  Provide MVI daily per tube.  NUTRITION DIAGNOSIS:   Inadequate oral intake related to inability to eat as evidenced by NPO status.  GOAL:   Patient will meet greater than or equal to 90% of their needs  MONITOR:   Vent status,Labs,Weight trends,TF tolerance,I & O's  REASON FOR ASSESSMENT:   Ventilator,Consult Enteral/tube feeding initiation and management  ASSESSMENT:   55 year old female with PMHx of HTN admitted after acute cardiac arrest also with DKA and severe acidosis.   1/10 intubated and initiated on 36C TTM  Patient is currently intubated on ventilator support MV: 8.9 L/min Temp (24hrs), Avg:96.5 F (35.8 C), Min:96.3 F (35.7 C), Max:96.8 F (36 C)  Propofol: 10.8 ml/hr (285 kcal daily)  Medications reviewed and include: Novolog 0-15 units Q4hrs, Protonix, potassium chloride 40 mEq per tube x 2 today, NS at 50 mL/hr, ceftriaxone, fentanyl gtt, potassium chloride 10 mEq IV x 4 today, propofol gtt.  Labs reviewed: CBG 240, Potassium 2.4, Phosphorus 5.  Enteral Access: 16 Fr. OGT placed 1/10; terminates in stomach per chest x-ray 1/10  Patient does not meet criteria for malnutrition at this time.  Discussed with RN and on rounds. Plan is to start tube feeds today. Patient has difficulty with maintaining electrolytes at home and drinks Gatorade frequently.  Addendum: Received secure chat message from RN that per husband patient typically follows a vegan diet at home. Husband is okay with patient receiving Vital AF tube feeding even though  it is not vegan. RN informed husband it does contain fish oil and he is okay with patient receiving this.  NUTRITION - FOCUSED PHYSICAL EXAM:  Flowsheet Row Most Recent Value  Orbital Region No depletion  Upper Arm Region No depletion  Thoracic and Lumbar Region No depletion  Buccal Region Unable to assess  Temple Region No depletion  Clavicle Bone Region No depletion  Clavicle and Acromion Bone Region No depletion  Scapular Bone Region Unable to assess  Dorsal Hand No depletion  Patellar Region No depletion  Anterior Thigh Region No depletion  Posterior Calf Region No depletion  Edema (RD Assessment) None  Hair Reviewed  Eyes Unable to assess  Mouth Unable to assess  Skin Reviewed  Nails Reviewed     Diet Order:   Diet Order    None     EDUCATION NEEDS:   No education needs have been identified at this time  Skin:  Skin Assessment:  (RN skin assessment not yet documented at this time)  Last BM:  Unknown  Height:   Ht Readings from Last 1 Encounters:  12/02/20 5\' 1"  (1.549 m)   Weight:   Wt Readings from Last 1 Encounters:  12/02/20 86.2 kg   Ideal Body Weight:  47.7 kg  BMI:  Body mass index is 35.91 kg/m.  Estimated Nutritional Needs:   Kcal:  [phone] (11-14 kcal/kg)  Protein:  95 grams (2 grams/kg IBW)  Fluid:  >/= 2 L/day  Jacklynn Barnacle, MS, RD, LDN Pager number available on Amion

## 2020-12-02 NOTE — Progress Notes (Signed)
PHARMACY CONSULT NOTE - FOLLOW UP  Pharmacy Consult for Electrolyte Monitoring and Replacement   Recent Labs: Potassium (mmol/L)  Date Value  12/02/2020 2.4 (LL)  02/20/2013 3.4 (L)   Magnesium (mg/dL)  Date Value  12/02/2020 2.0   Calcium (mg/dL)  Date Value  12/02/2020 7.6 (L)   Calcium, Total (mg/dL)  Date Value  02/20/2013 8.7   Albumin (g/dL)  Date Value  12/02/2020 3.2 (L)  02/20/2013 3.2 (L)   Phosphorus (mg/dL)  Date Value  12/02/2020 5.0 (H)   Sodium (mmol/L)  Date Value  12/02/2020 142  02/20/2013 137     Assessment: 55 year old female presented to the ED via EMS post witnessed cardiac arrest. ROSC achieved following 3 defibrillations and one round of epinephrine. Patient found to have significant hypokalemia on arrival. Given witnessed arrest, patient Code ICE. Pharmacy to manage electrolytes.    Goal of Therapy:  Electrolytes WNL, K ~ 4 and Mg ~ 2 given cardiac events  Plan:  Potassium 40 mEq per tube for two doses as well as 10 mEq IV x 4 runs. Plan to recheck electrolytes this evening.   Will need to monitor closely. As patient begins rewarming process, expect potassium to correct itself requiring less supplementation.  All electrolytes ordered this evening. Plan to follow up in the morning as well.  Tawnya Crook ,PharmD Clinical Pharmacist 12/02/2020 3:02 PM

## 2020-12-02 NOTE — ED Provider Notes (Addendum)
[facility] Emergency Department Provider Note  ____________________________________________  Time seen: Approximately 7:06 AM  I have reviewed the triage vital signs and the nursing notes.   HISTORY  Chief Complaint Cardiac Arrest   HPI [name] [name] is a 55 y.o. female with a history of hypertension who presents to the emergency department via EMS after cardiac arrest.  Unknown downtime.  Last seen by family an hour prior to being discovered at home.  Initial rhythm V. fib.  Patient was shocked 3 times and received 1 round of epi with CPR in the field and ROSC.  Patient arrives with a GCS of 3, King airway, regular pulse.   Past Medical History:  Diagnosis Date   Hypertension      Prior to Admission medications   Medication Sig Start Date End Date Taking? Authorizing Provider  chlorthalidone (HYGROTON) 25 MG tablet Take 25 mg by mouth daily.    [provider]  fexofenadine (ALLEGRA) 180 MG tablet Take 180 mg by mouth daily.    [provider]  ketoconazole (NIZORAL) 2 % cream Apply 1 application topically daily. For 2 weeks 04/13/18   Marylene Land, NP  mupirocin ointment (BACTROBAN) 2 % Apply two times a day for 7 days. 04/13/18   Marylene Land, NP  potassium chloride SA (K-DUR,KLOR-CON) 20 MEQ tablet Take 1 tablet (20 mEq total) by mouth 2 (two) times daily. 10/24/17   Schuyler Amor, MD    Allergies Patient has no known allergies.  Family History  Problem Relation Age of Onset   Hypertension Mother    Leukemia Mother    Diabetes Mother    Healthy Father     Social History Social History   Tobacco Use   Smoking status: Former Smoker    Quit date: 2007    Years since quitting: 15.0   Smokeless tobacco: Never Used  Scientific laboratory technician Use: Never used  Substance Use Topics   Alcohol use: No   Drug use: Never    Review of Systems  + cardiac  arrest ____________________________________________   PHYSICAL EXAM:  VITAL SIGNS: 96.64F      22     100%     101     184/116  Gen: unresponsive, GCS 3 Cardiovascular: strong pulses x 4, regular Resp: apneic. Breath sounds equal bilaterally with bagging  Abd: nondistended  Neuro: GCS 3, unresponsive to pain  HEENT: No blood in posterior pharynx, gag reflex absent, Pupils 4 mm and reactive bilaterally  Neck: No crepitus  Musculoskeletal: No deformity  Skin: warm  ____________________________________________   LABS (all labs ordered are listed, but only abnormal results are displayed)  Labs Reviewed  RESP PANEL BY RT-PCR (FLU A&B, COVID) ARPGX2  CBC WITH DIFFERENTIAL/PLATELET  COMPREHENSIVE METABOLIC PANEL  PROTIME-INR  APTT  URINALYSIS, COMPLETE (UACMP) WITH MICROSCOPIC  URINE DRUG SCREEN, QUALITATIVE ([facility] ONLY)  LACTIC ACID, PLASMA  LACTIC ACID, PLASMA  TROPONIN I (HIGH SENSITIVITY)   ____________________________________________  EKG  ED ECG REPORT I, Rudene Re, the attending physician, personally viewed and interpreted this ECG.  Normal sinus rhythm, rate of 97, borderline prolonged QTC, normal axis, no ST elevations or depressions ____________________________________________  RADIOLOGY  CXR: PND  Head CT: PND  ____________________________________________   PROCEDURES  Procedure(s) performed:yes Procedure Name: Intubation Date/Time: 12/02/2020 7:12 AM Performed by: Rudene Re, MD Pre-anesthesia Checklist: Patient identified, Emergency Drugs available, Suction available and Patient being monitored Preoxygenation: Pre-oxygenation with 100% oxygen Induction Type: IV induction  and Rapid sequence Ventilation: Mask ventilation without difficulty Laryngoscope Size: Glidescope and 3 Tube size: 7.5 mm Number of attempts: 1 Airway Equipment and Method: Video-laryngoscopy Placement Confirmation: ETT inserted through vocal cords under direct  vision,  CO2 detector,  Breath sounds checked- equal and bilateral and Positive ETCO2 Secured at: 23 cm Tube secured with: ETT holder Dental Injury: Teeth and Oropharynx as per pre-operative assessment      OG placement  Date/Time: 12/02/2020 7:30 AM Performed by: Rudene Re, MD Authorized by: Rudene Re, MD  Consent: The procedure was performed in an emergent situation. Preparation: Patient was prepped and draped in the usual sterile fashion. Local anesthesia used: no  Anesthesia: Local anesthesia used: no  Sedation: Patient sedated: yes (Intubated)  Patient tolerance: patient tolerated the procedure well with no immediate complications    Critical Care performed: yes  CRITICAL CARE Performed by: Rudene Re  ?  Total critical care time: 30 min  Critical care time was exclusive of separately billable procedures and treating other patients.  Critical care was necessary to treat or prevent imminent or life-threatening deterioration.  Critical care was time spent personally by me on the following activities: development of treatment plan with patient and/or surrogate as well as nursing, discussions with consultants, evaluation of patient's response to treatment, examination of patient, obtaining history from patient or surrogate, ordering and performing treatments and interventions, ordering and review of laboratory studies, ordering and review of radiographic studies, pulse oximetry and re-evaluation of patient's condition.  ____________________________________________   INITIAL IMPRESSION / ASSESSMENT AND PLAN / ED COURSE  55 y.o. female with a history of hypertension who presents to the emergency department via EMS after cardiac arrest.  Patient with ROSC in the field.  Initial rhythm V. fib.  Shocked 3 times with 1 round of epi prior to arrival.  Patient arrives with a GCS of 3, pupils are 4 mm and reactive bilaterally, no motor or verbal  response.  Patient with strong regular pulses in all 4 extremities.  King airway in place which was exchanged for an ET tube per procedure note above.  Patient received rocuronium to facilitate intubation.  Patient started on propofol.  Blood pressure elevated with systolics in the 932T.  Head CT, post intubation chest x-ray, lab work, and COVID swab all have been ordered.  EKG with no signs of STEMI or acute ischemia. Care transferred to Dr. Cherylann Banas.       _____________________________________________ Please note:  Patient was evaluated in Emergency Department today for the symptoms described in the history of present illness. Patient was evaluated in the context of the global COVID-19 pandemic, which necessitated consideration that the patient might be at risk for infection with the SARS-CoV-2 virus that causes COVID-19. Institutional protocols and algorithms that pertain to the evaluation of patients at risk for COVID-19 are in a state of rapid change based on information released by regulatory bodies including the CDC and federal and state organizations. These policies and algorithms were followed during the patient's care in the ED.  Some ED evaluations and interventions may be delayed as a result of limited staffing during the pandemic.   [place] Controlled Substance Database was reviewed by me. ____________________________________________   FINAL CLINICAL IMPRESSION(S) / ED DIAGNOSES   Final diagnoses:  Cardiac arrest ([place])      NEW MEDICATIONS STARTED DURING THIS VISIT:  ED Discharge Orders    None       Note:  This document was prepared using Dragon voice recognition  software and may include unintentional dictation errors.    Alfred Levins, [place], MD 12/03/20 [place], [place], MD 12/03/20 [phone]

## 2020-12-02 NOTE — ED Notes (Signed)
Code report to [employer], rn.

## 2020-12-02 NOTE — ED Provider Notes (Signed)
-----------------------------------------   8:09 AM on 12/02/2020 -----------------------------------------  I took over care of this patient from Dr. Alfred Levins.  The patient has remained stable, sedated with propofol.  CT head shows no acute findings.  Initial work-up is significant for a lactate of 10, urinalysis with RBCs and WBCs, minimally elevated troponin, glucose 357, elevated LFTs, anion gap 21.  The patient has become significantly hypertensive so I have ordered IV labetalol.  I consulted Dr. Mortimer Fries from the ICU for admission.   Arta Silence, MD 12/02/20 [phone]

## 2020-12-02 NOTE — Progress Notes (Signed)
   12/02/20 2100  Clinical Encounter Type  Visited With Health care provider  Visit Type Spiritual support;Follow-up  Referral From Nurse  Consult/Referral To Chaplain  Recommendations Support husband and family when they return on 12/03/20   Order requisition received to support the husband and family of this patient in the wake of recent cardiac arrest and CPR. Upon arrival, the patient was receiving care from her bedside nurse and the IV Team nurse. The patient's nurse shared that the patient's family recently left, but she felt they could benefit from spiritual care/chaplain services. The patient's nurse advised that the family will return tomorrow. This Probation officer will share this need with the on-coming chaplain for appropriate follow-up care.  Gennaro Africa, Chaplain

## 2020-12-03 ENCOUNTER — Inpatient Hospital Stay: Payer: Self-pay

## 2020-12-03 ENCOUNTER — Inpatient Hospital Stay (HOSPITAL_COMMUNITY)
Admit: 2020-12-03 | Discharge: 2020-12-03 | Disposition: A | Discharge status: Home / Self Care | Payer: Self-pay | Attending: Internal Medicine | Admitting: Internal Medicine

## 2020-12-03 DIAGNOSIS — G931 Anoxic brain damage, not elsewhere classified: Secondary | ICD-10-CM

## 2020-12-03 DIAGNOSIS — I469 Cardiac arrest, cause unspecified: Secondary | ICD-10-CM

## 2020-12-03 LAB — BASIC METABOLIC PANEL
Anion gap: 13 (ref 5–15)
BUN: 19 mg/dL (ref 6–20)
CO2: 24 mmol/L (ref 22–32)
Calcium: 8.1 mg/dL — ABNORMAL LOW (ref 8.9–10.3)
Chloride: 105 mmol/L (ref 98–111)
Creatinine, Ser: 1.06 mg/dL — ABNORMAL HIGH (ref 0.44–1.00)
GFR, Estimated: >60 mL/min (ref >60)
Glucose, Bld: 141 mg/dL — ABNORMAL HIGH (ref 70–99)
Potassium: 3.5 mmol/L (ref 3.5–5.1)
Sodium: 142 mmol/L (ref 135–145)

## 2020-12-03 LAB — ECHOCARDIOGRAM COMPLETE
AR max vel: 2.06 cm2
AV Area VTI: 2.44 cm2
AV Area mean vel: 2 cm2
AV Mean grad: 6 mmHg
AV Peak grad: 9.2 mmHg
Ao pk vel: 1.52 m/s
Area-P 1/2: 2.96 cm2
Height: 60.984 in
S' Lateral: 2.6 cm
Weight: 3051.17 oz

## 2020-12-03 LAB — PHOSPHORUS
Phosphorus: 5 mg/dL — ABNORMAL HIGH (ref 2.5–4.6)
Phosphorus: 5.7 mg/dL — ABNORMAL HIGH (ref 2.5–4.6)

## 2020-12-03 LAB — GLUCOSE, CAPILLARY
Glucose-Capillary: 130 mg/dL — ABNORMAL HIGH (ref 70–99)
Glucose-Capillary: 118 mg/dL — ABNORMAL HIGH (ref 70–99)
Glucose-Capillary: 118 mg/dL — ABNORMAL HIGH (ref 70–99)
Glucose-Capillary: 136 mg/dL — ABNORMAL HIGH (ref 70–99)
Glucose-Capillary: 128 mg/dL — ABNORMAL HIGH (ref 70–99)

## 2020-12-03 LAB — URINE CULTURE: Culture: NO GROWTH

## 2020-12-03 LAB — LACTIC ACID, PLASMA: Lactic Acid, Venous: 1.3 mmol/L (ref 0.5–1.9)

## 2020-12-03 LAB — MAGNESIUM
Magnesium: 2.7 mg/dL — ABNORMAL HIGH (ref 1.7–2.4)
Magnesium: 2.6 mg/dL — ABNORMAL HIGH (ref 1.7–2.4)

## 2020-12-03 LAB — TROPONIN I (HIGH SENSITIVITY): Troponin I (High Sensitivity): 448 ng/L (ref <18)

## 2020-12-03 IMAGING — DX DG CHEST 1V
1 series · 1 of 1 positions shown · non-contrast
Comparison: [DATE]

CLINICAL DATA: Central line placement

EXAM:
CHEST  1 VIEW

[chest ap]
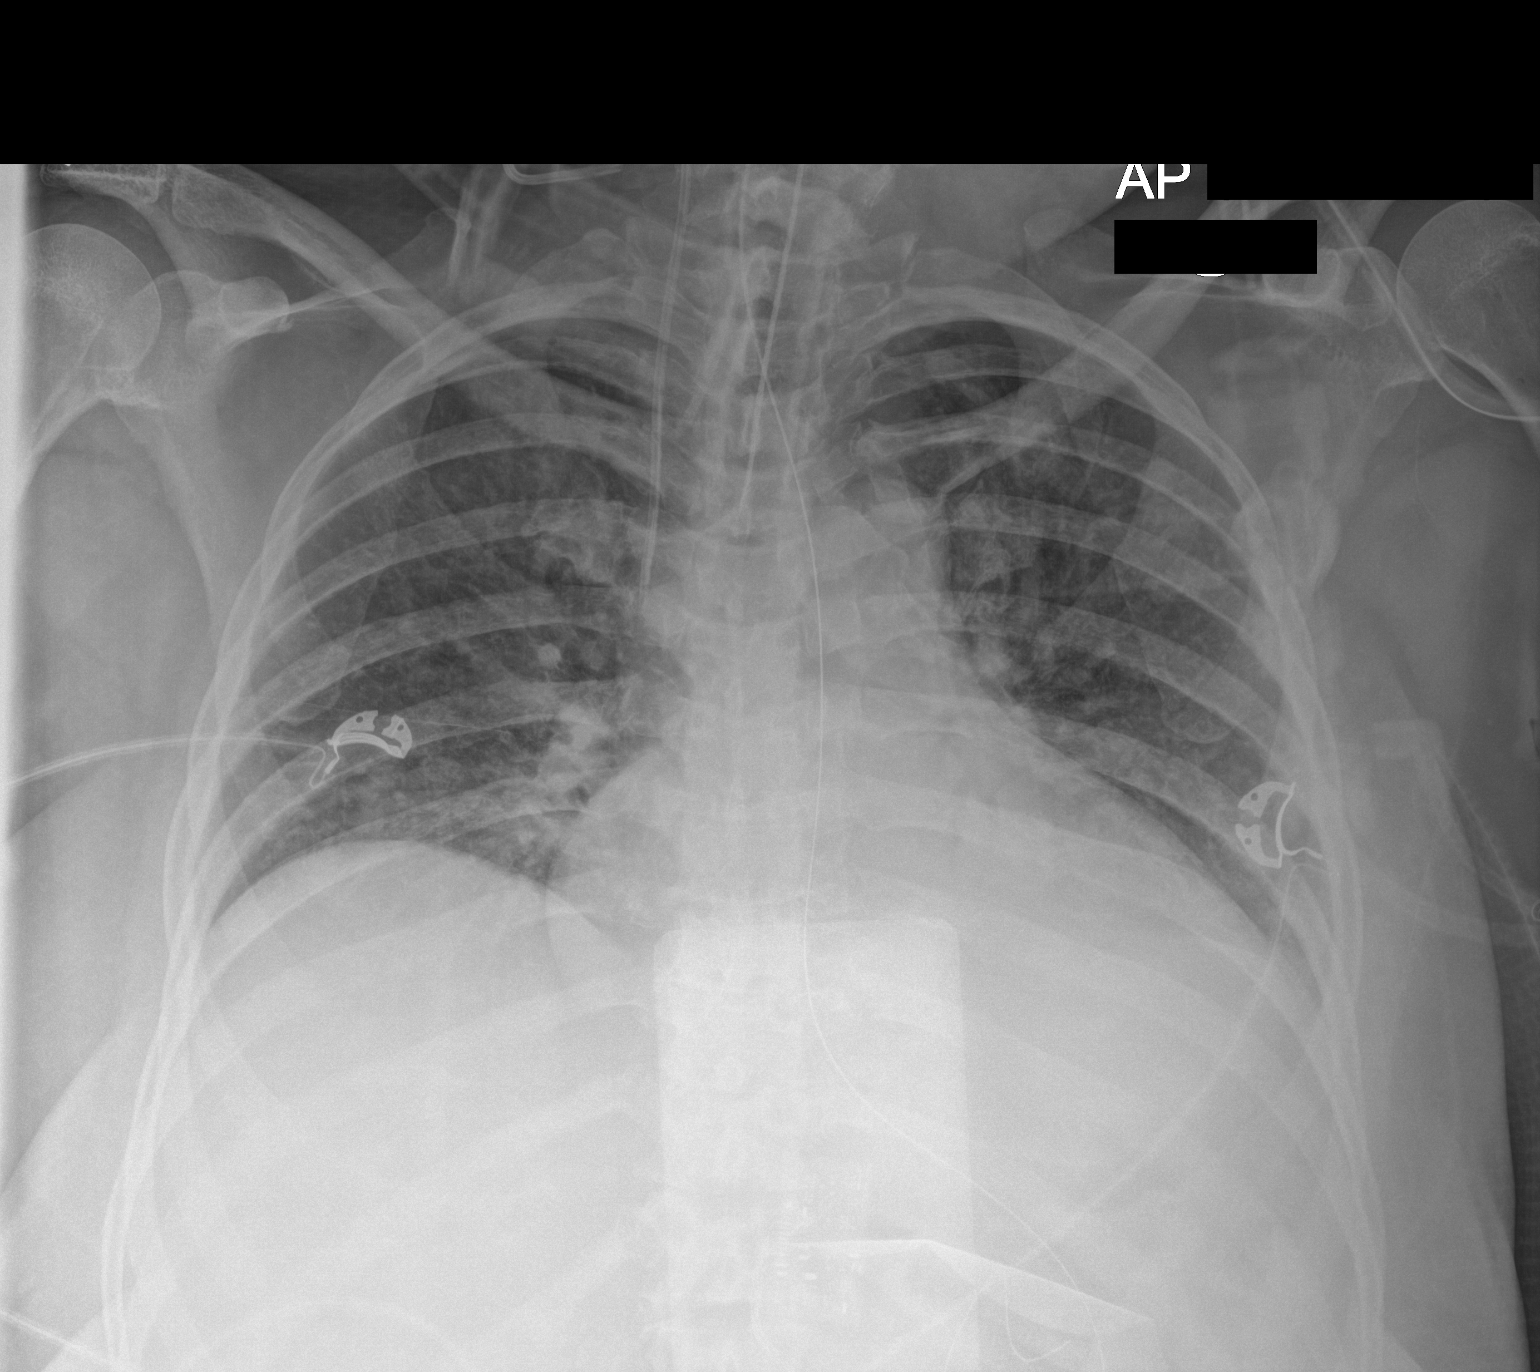

[1 of 1 positions shown; findings below may reference images not displayed]

FINDINGS: Support Apparatus:

--Endotracheal tube: Tip at the level of the clavicular heads.

--Enteric tube:Tip and sideport project over the stomach.

--Catheter(s):Right internal jugular vein approach central venous
catheter tip is in the lower SVC

--Other: None

The heart size and mediastinal contours are within normal limits.
The lungs are clear. No pleural effusion or pneumothorax.
IMPRESSION: Endotracheal tube tip at the level of the clavicular heads.

## 2020-12-03 MED ORDER — LABETALOL HCL 5 MG/ML IV SOLN
20.0000 mg | Freq: Once | INTRAVENOUS | Status: AC
  Administered 2020-12-03: 20 mg via INTRAVENOUS
  Filled 2020-12-03: qty 4

## 2020-12-03 MED ORDER — POTASSIUM CHLORIDE 20 MEQ PO PACK
40.0000 meq | PACK | Freq: Once | ORAL | Status: AC
  Administered 2020-12-03: 40 meq
  Filled 2020-12-03: qty 2

## 2020-12-03 MED ORDER — VITAL AF 1.2 CAL PO LIQD
1000.0000 mL | ORAL | Status: DC
  Administered 2020-12-03 – 2020-12-26 (×25): 1000 mL

## 2020-12-03 MED ORDER — VITAL AF 1.2 CAL PO LIQD: 1000.0000 mL | ORAL | Status: DC

## 2020-12-03 MED ORDER — PROSOURCE TF PO LIQD
45.0000 mL | Freq: Two times a day (BID) | ORAL | Status: DC
  Administered 2020-12-03 – 2020-12-26 (×46): 45 mL
  Filled 2020-12-03 (×29): qty 45

## 2020-12-03 NOTE — Progress Notes (Signed)
CRITICAL CARE NOTE  [name] with acute cardiac arrest Labs c/w severe acidosis and severe DKA Patient with witnessed V fib arrest and shocked in the field ROSC returned is unknown  1/10 admitted to ICU for cardiac arrest, started hypothermia protocol 1/11 hypothermia protocol +posturing    CC  follow up respiratory failure  SUBJECTIVE Patient remains critically ill Prognosis is guarded  Vent Mode: PRVC FiO2 (%):  [30 %-40 %] 30 % Set Rate:  [15 bmp] 15 bmp Vt Set:  [500 mL] 500 mL PEEP:  [5 cmH20] 5 cmH20 Plateau Pressure:  [13 cmH20-16 cmH20] 16 cmH20 CBC    Component Value Date/Time   WBC 14.8 (H) 12/02/2020 0944   RBC 3.99 12/02/2020 0944   HGB 9.7 (L) 12/02/2020 0944   HGB 11.6 (L) 02/20/2013 2053   HCT 33.7 (L) 12/02/2020 0944   HCT 34.3 (L) 02/20/2013 2053   PLT 240 12/02/2020 0944   PLT 213 02/20/2013 2053   MCV 84.5 12/02/2020 0944   MCV 93 02/20/2013 2053   MCH 24.3 (L) 12/02/2020 0944   MCHC 28.8 (L) 12/02/2020 0944   RDW 18.2 (H) 12/02/2020 0944   RDW 14.1 02/20/2013 2053   LYMPHSABS 5.2 (H) 12/02/2020 0658   MONOABS 0.4 12/02/2020 0658   EOSABS 0.6 (H) 12/02/2020 0658   BASOSABS 0.1 12/02/2020 0658   BMP Latest Ref Rng & Units 12/03/2020 12/02/2020 12/02/2020  Glucose 70 - 99 mg/dL 141(H) 142(H) 273(H)  BUN 6 - 20 mg/dL 19 11 8   Creatinine 0.44 - 1.00 mg/dL 1.06(H) 0.96 0.92  Sodium 135 - 145 mmol/L 142 145 142  Potassium 3.5 - 5.1 mmol/L 3.5 3.8 2.4(LL)  Chloride 98 - 111 mmol/L 105 105 103  CO2 22 - 32 mmol/L 24 24 22   Calcium 8.9 - 10.3 mg/dL 8.1(L) 7.8(L) 7.6(L)    BP 112/80   Pulse (!) 58   Temp 97.7 F (36.5 C)   Resp 17   Ht 5' 0.98" (1.549 m)   Wt 86.5 kg   SpO2 99%   BMI 36.05 kg/m    I/O last 3 completed shifts: In: 4009.2 [I.V.:1265.9; NG/GT:293.3; IV Piggyback:2450] Out: [id number] [Urine:7050] No intake/output data recorded.  SpO2: 99 % FiO2 (%): 30 %  Estimated body mass index is 36.05 kg/m as calculated from the  following:   Height as of this encounter: 5' 0.98" (1.549 m).   Weight as of this encounter: 86.5 kg.  SIGNIFICANT EVENTS   REVIEW OF SYSTEMS  PATIENT IS UNABLE TO PROVIDE COMPLETE REVIEW OF SYSTEMS DUE TO SEVERE CRITICAL ILLNESS        PHYSICAL EXAMINATION:  GENERAL:critically ill appearing, +resp distress HEAD: Normocephalic, atraumatic.  EYES: Pupils equal, round, reactive to light.  No scleral icterus.  MOUTH: Moist mucosal membrane. NECK: Supple.  PULMONARY: +rhonchi, +wheezing CARDIOVASCULAR: S1 and S2. Regular rate and rhythm. No murmurs, rubs, or gallops.  GASTROINTESTINAL: Soft, nontender, -distended.  Positive bowel sounds.   MUSCULOSKELETAL: No swelling, clubbing, or edema.  NEUROLOGIC: obtunded, GCS<8 SKIN:intact,warm,dry  MEDICATIONS: I have reviewed all medications and confirmed regimen as documented   CULTURE RESULTS   Recent Results (from the past 240 hour(s))  Resp Panel by RT-PCR (Flu A&B, Covid) Nasopharyngeal Swab     Status: None   Collection Time: 12/02/20  7:23 AM   Specimen: Nasopharyngeal Swab; Nasopharyngeal(NP) swabs in vial transport medium  Result Value Ref Range Status   SARS Coronavirus 2 by RT PCR NEGATIVE NEGATIVE Final    Comment: (NOTE)  SARS-CoV-2 target nucleic acids are NOT DETECTED.  The SARS-CoV-2 RNA is generally detectable in upper respiratory specimens during the acute phase of infection. The lowest concentration of SARS-CoV-2 viral copies this assay can detect is 138 copies/mL. A negative result does not preclude SARS-Cov-2 infection and should not be used as the sole basis for treatment or other patient management decisions. A negative result may occur with  improper specimen collection/handling, submission of specimen other than nasopharyngeal swab, presence of viral mutation(s) within the areas targeted by this assay, and inadequate number of viral copies(<138 copies/mL). A negative result must be combined  with clinical observations, patient history, and epidemiological information. The expected result is Negative.  Fact Sheet for Patients:  EntrepreneurPulse.com.au  Fact Sheet for Healthcare Providers:  IncredibleEmployment.be  This test is no t yet approved or cleared by the [place] FDA and  has been authorized for detection and/or diagnosis of SARS-CoV-2 by FDA under an Emergency Use Authorization (EUA). This EUA will remain  in effect (meaning this test can be used) for the duration of the COVID-19 declaration under Section 564(b)(1) of the Act, 21 U.S.C.section 360bbb-3(b)(1), unless the authorization is terminated  or revoked sooner.       Influenza A by PCR NEGATIVE NEGATIVE Final   Influenza B by PCR NEGATIVE NEGATIVE Final    Comment: (NOTE) The Xpert Xpress SARS-CoV-2/FLU/RSV plus assay is intended as an aid in the diagnosis of influenza from Nasopharyngeal swab specimens and should not be used as a sole basis for treatment. Nasal washings and aspirates are unacceptable for Xpert Xpress SARS-CoV-2/FLU/RSV testing.  Fact Sheet for Patients: EntrepreneurPulse.com.au  Fact Sheet for Healthcare Providers: IncredibleEmployment.be  This test is not yet approved or cleared by the [place] FDA and has been authorized for detection and/or diagnosis of SARS-CoV-2 by FDA under an Emergency Use Authorization (EUA). This EUA will remain in effect (meaning this test can be used) for the duration of the COVID-19 declaration under Section 564(b)(1) of the Act, 21 U.S.C. section 360bbb-3(b)(1), unless the authorization is terminated or revoked.  Performed at [facility], [address] [place], [place] [address]   MRSA PCR Screening     Status: None   Collection Time: 12/02/20  9:33 AM   Specimen: Nasopharyngeal  Result Value Ref Range Status   MRSA by PCR NEGATIVE NEGATIVE Final     Comment:        The GeneXpert MRSA Assay (FDA approved for NASAL specimens only), is one component of a comprehensive MRSA colonization surveillance program. It is not intended to diagnose MRSA infection nor to guide or monitor treatment for MRSA infections. Performed at [facility], [address] [place], [place] [address]           IMAGING    DG Chest 1 View  Result Date: 12/03/2020 CLINICAL DATA:  Central line placement EXAM: CHEST  1 VIEW COMPARISON:  12/02/2020 FINDINGS: Support Apparatus: --Endotracheal tube: Tip at the level of the clavicular heads. --Enteric tube:Tip and sideport project over the stomach. --Catheter(s):Right internal jugular vein approach central venous catheter tip is in the lower SVC --Other: None The heart size and mediastinal contours are within normal limits. The lungs are clear. No pleural effusion or pneumothorax. IMPRESSION: Endotracheal tube tip at the level of the clavicular heads. Electronically Signed   By: Ulyses Jarred M.D.   On: 12/03/2020 02:17   DG Chest Portable 1 View  Result Date: 12/02/2020 CLINICAL DATA:  Hypoxia EXAM: PORTABLE CHEST  1 VIEW COMPARISON:  None. FINDINGS: Endotracheal tube tip is 3.1 cm above the carina. Nasogastric tube tip and side port are in the stomach. No pneumothorax. There is no edema or airspace opacity. There is cardiomegaly with pulmonary vascularity normal. No adenopathy. No bone lesions. IMPRESSION: Tube positions as described without pneumothorax. No edema or airspace opacity. There is cardiomegaly. Electronically Signed   By: Lowella Grip III M.D.   On: 12/02/2020 07:59   EEG adult  Result Date: 12/02/2020 Lora Havens, MD     12/02/2020  5:47 PM Patient Name: CHARNISE LOVAN MRN: [id number] Epilepsy Attending: Lora Havens Referring Physician/Provider: Dr Flora Lipps Date: 12/02/2020 Duration: 22.29 mins Patient history: 55yo F s/p cardiac arrest. EEG to evaluate for seizure Level of  alertness:  comatose AEDs during EEG study: Propofol Technical aspects: This EEG study was done with scalp electrodes positioned according to the 10-20 International system of electrode placement. Electrical activity was acquired at a sampling rate of 500Hz  and reviewed with a high frequency filter of 70Hz  and a low frequency filter of 1Hz . EEG data were recorded continuously and digitally stored. Description: EEG showed continuous generalized 2-3hz  theta slowing admixed with 15-18hz  generalized beta activity. EEG was reactive to tactile stimulation. Hyperventilation and photic stimulation were not performed.   Of note parts of study were difficult to interpret due to myogenic artifact. ABNORMALITY -Continuous  slow, generalized IMPRESSION: This study is suggestive of severe diffuse encephalopathy, nonspecific etiology but likely related to sedation, anoxic/hypoxic brain injury. No seizures or epileptiform discharges were seen throughout the recording. [place]     Nutrition Status: Nutrition Problem: Inadequate oral intake Etiology: inability to eat Signs/Symptoms: NPO status Interventions: Tube feeding,Prostat,MVI     Indwelling Urinary Catheter continued, requirement due to   Reason to continue Indwelling Urinary Catheter strict Intake/Output monitoring for hemodynamic instability   Central Line/ continued, requirement due to  Reason to continue [place] of central venous pressure or other hemodynamic parameters and poor IV access   Ventilator continued, requirement due to severe respiratory failure   Ventilator Sedation RASS 0 to -2      ASSESSMENT AND PLAN SYNOPSIS   55 yo obese [name] with acute Resp hypoxic resp failure due to severe cardiac arrest due to severe acidosis from DKA Continue  HYPOTHERMIA PROTOCOL   Severe ACUTE Hypoxic and Hypercapnic Respiratory Failure -continue Full MV support -continue Bronchodilator Therapy -Wean Fio2 and PEEP as  tolerated -VAP/VENT bundle implementation  ACUTE CARDIAC FAILURE-  -oxygen as needed -Lasix as tolerated Hypothermia protocol-rewarming phase this AM Check ECHO  Morbid obesity, possible OSA.   Will certainly impact respiratory mechanics, ventilator weaning Suspect will need to consider additional PEEP   ACUTE KIDNEY INJURY/Renal Failure -continue Foley Catheter-assess need -Avoid nephrotoxic agents -Follow urine output, BMP -Ensure adequate renal perfusion, optimize oxygenation -Renal dose medications     NEUROLOGY Acute toxic metabolic encephalopathy, need for sedation Goal RASS -2 to -3 Very concerning for brain damage-will need to assess NEURO status in next 24-48 hrs  SHOCK-CARDIOGENIC -use vasopressors to keep MAP>65 -follow ABG and LA -follow up cultures -emperic ABX -consider stress dose steroids   CARDIAC ICU monitoring  ID -continue IV abx as prescibed -follow up cultures  GI GI PROPHYLAXIS as indicated  NUTRITIONAL STATUS Nutrition Status: Nutrition Problem: Inadequate oral intake Etiology: inability to eat Signs/Symptoms: NPO status Interventions: Tube feeding,Prostat,MVI   DIET--> as tolerated Constipation protocol as indicated  ENDO- Dx of DKA - will  use ICU hypoglycemic\Hyperglycemia protocol if indicated     ELECTROLYTES -follow labs as needed -replace as needed -pharmacy consultation and following   DVT/GI PRX ordered and assessed TRANSFUSIONS AS NEEDED MONITOR FSBS I Assessed the need for Labs I Assessed the need for Foley I Assessed the need for Central Venous Line Family Discussion when available I Assessed the need for Mobilization I made an Assessment of medications to be adjusted accordingly Safety Risk assessment completed   CASE DISCUSSED IN MULTIDISCIPLINARY ROUNDS WITH ICU TEAM  Critical Care Time devoted to patient care services described in this note is 45 minutes.   Overall, patient is critically ill,  prognosis is guarded.  Patient with Multiorgan failure and at high risk for cardiac arrest and death.   Prognosis is poor, guarded. I am very concerned taht patient may have severe brain damage  Corrin Parker, M.D.  [facility]  Medical Director [place] Director [facility] Cardio-Pulmonary Department

## 2020-12-03 NOTE — Progress Notes (Signed)
[name] [name] visited pt. per referral from RN; pt.'s mother sitting in wheelchair at bedside, tearful and overwhelmed.  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  [name] listened w/mthr. for a little while before being called to another visit.  CH remains available as needed.

## 2020-12-03 NOTE — Procedures (Signed)
Central Venous Catheter Insertion Procedure Note  [name] [name]  [id number]  [birth year]  Date:12/03/20  Time:1:56 AM   Provider Performing:Jeremiah D Dewaine Conger   Procedure: Insertion of Non-tunneled Central Venous Catheter(36556) with US guidance (15176)   Indication(s) Medication administration and Difficult access  Consent Unable to obtain consent due to emergent nature of procedure.  Anesthesia Topical only with 1% lidocaine   Timeout Verified patient identification, verified procedure, site/side was marked, verified correct patient position, special equipment/implants available, medications/allergies/relevant history reviewed, required imaging and test results available.  Sterile Technique Maximal sterile technique including full sterile barrier drape, hand hygiene, sterile gown, sterile gloves, mask, hair covering, sterile ultrasound probe cover (if used).  Procedure Description Area of catheter insertion was cleaned with chlorhexidine and draped in sterile fashion.  With real-time ultrasound guidance a central venous catheter was placed into the right internal jugular vein. Nonpulsatile blood flow and easy flushing noted in all ports.  The catheter was sutured in place and sterile dressing applied.  Complications/Tolerance None; patient tolerated the procedure well. Chest X-ray is ordered to verify placement for internal jugular or subclavian cannulation.   Chest x-ray is not ordered for femoral cannulation.  EBL Minimal  Specimen(s) None  Line secured at the 15 cm mark.  BIOPATCH applied to the insertion site.   [name] [name], AGACNP-BC [facility] Pager: [phone]

## 2020-12-03 NOTE — Progress Notes (Signed)
Chaplain On-Call followed a referral from [place] to visit and support patient's family.  Chaplain met patient's [name] [name] at bedside. [name] [name] stated that the family is seeking to learn what happened to cause the patient's sudden illness and non-responsive condition.  Chaplain provided spiritual and emotional support and prayer.  Blaine Breslin Burklow M.Div., [facility]

## 2020-12-03 NOTE — Progress Notes (Signed)
[place] for Electrolyte Monitoring and Replacement   Recent Labs: Potassium (mmol/L)  Date Value  12/03/2020 3.5  02/20/2013 3.4 (L)   Magnesium (mg/dL)  Date Value  12/03/2020 2.7 (H)   Calcium (mg/dL)  Date Value  12/03/2020 8.1 (L)   Calcium, Total (mg/dL)  Date Value  02/20/2013 8.7   Albumin (g/dL)  Date Value  12/02/2020 3.2 (L)  02/20/2013 3.2 (L)   Phosphorus (mg/dL)  Date Value  12/03/2020 5.0 (H)   Sodium (mmol/L)  Date Value  12/03/2020 142  02/20/2013 137     Assessment: 56 year old female presented to the ED via EMS post witnessed cardiac arrest. ROSC achieved following 3 defibrillations and one round of epinephrine. Patient found to have significant hypokalemia on arrival. Given witnessed arrest, patient Code ICE. Pharmacy to manage electrolytes.    MIVF: NS at 50 mL/hr  Goal of Therapy:  Electrolytes WNL, K ~ 4 and Mg ~ 2 given cardiac events  Plan:  With h/o electrolyte abnormalities, will give potassium 40 mEq per tube for one dose to prevent potassium from dropping. Follow up electrolytes with morning labs.  Tawnya Crook, PharmD 12/03/2020 11:49 AM

## 2020-12-03 NOTE — Progress Notes (Signed)
*  PRELIMINARY RESULTS* Echocardiogram 2D Echocardiogram has been performed.  [name] [name] 12/03/2020, 1:21 PM

## 2020-12-04 ENCOUNTER — Inpatient Hospital Stay: Payer: Self-pay

## 2020-12-04 LAB — BASIC METABOLIC PANEL
Anion gap: 7 (ref 5–15)
BUN: 24 mg/dL — ABNORMAL HIGH (ref 6–20)
CO2: 26 mmol/L (ref 22–32)
Calcium: 8.2 mg/dL — ABNORMAL LOW (ref 8.9–10.3)
Chloride: 108 mmol/L (ref 98–111)
Creatinine, Ser: 0.91 mg/dL (ref 0.44–1.00)
GFR, Estimated: >60 mL/min (ref >60)
Glucose, Bld: 127 mg/dL — ABNORMAL HIGH (ref 70–99)
Potassium: 3.4 mmol/L — ABNORMAL LOW (ref 3.5–5.1)
Sodium: 141 mmol/L (ref 135–145)

## 2020-12-04 LAB — HEPATIC FUNCTION PANEL
ALT: 89 U/L — ABNORMAL HIGH (ref 0–44)
AST: 72 U/L — ABNORMAL HIGH (ref 15–41)
Albumin: 2.9 g/dL — ABNORMAL LOW (ref 3.5–5.0)
Alkaline Phosphatase: 59 U/L (ref 38–126)
Bilirubin, Direct: <0.1 mg/dL (ref 0.0–0.2)
Total Bilirubin: 0.5 mg/dL (ref 0.3–1.2)
Total Protein: 7.1 g/dL (ref 6.5–8.1)

## 2020-12-04 LAB — CBC WITH DIFFERENTIAL/PLATELET
Abs Immature Granulocytes: 0.08 10*3/uL — ABNORMAL HIGH (ref 0.00–0.07)
Basophils Absolute: 0 10*3/uL (ref 0.0–0.1)
Basophils Relative: 0 %
Eosinophils Absolute: 0.1 10*3/uL (ref 0.0–0.5)
Eosinophils Relative: 1 %
HCT: 28.3 % — ABNORMAL LOW (ref 36.0–46.0)
Hemoglobin: 8.2 g/dL — ABNORMAL LOW (ref 12.0–15.0)
Immature Granulocytes: 1 %
Lymphocytes Relative: 11 %
Lymphs Abs: 1.5 10*3/uL (ref 0.7–4.0)
MCH: 24.3 pg — ABNORMAL LOW (ref 26.0–34.0)
MCHC: 29 g/dL — ABNORMAL LOW (ref 30.0–36.0)
MCV: 84 fL (ref 80.0–100.0)
Monocytes Absolute: 0.9 10*3/uL (ref 0.1–1.0)
Monocytes Relative: 6 %
Neutro Abs: 11.8 10*3/uL — ABNORMAL HIGH (ref 1.7–7.7)
Neutrophils Relative %: 81 %
Platelets: 185 10*3/uL (ref 150–400)
RBC: 3.37 MIL/uL — ABNORMAL LOW (ref 3.87–5.11)
RDW: 18.6 % — ABNORMAL HIGH (ref 11.5–15.5)
WBC: 14.4 10*3/uL — ABNORMAL HIGH (ref 4.0–10.5)
nRBC: 0 % (ref 0.0–0.2)

## 2020-12-04 LAB — GLUCOSE, CAPILLARY
Glucose-Capillary: 101 mg/dL — ABNORMAL HIGH (ref 70–99)
Glucose-Capillary: 107 mg/dL — ABNORMAL HIGH (ref 70–99)
Glucose-Capillary: 108 mg/dL — ABNORMAL HIGH (ref 70–99)
Glucose-Capillary: 118 mg/dL — ABNORMAL HIGH (ref 70–99)
Glucose-Capillary: 93 mg/dL (ref 70–99)

## 2020-12-04 LAB — PHOSPHORUS: Phosphorus: 4.3 mg/dL (ref 2.5–4.6)

## 2020-12-04 LAB — MAGNESIUM: Magnesium: 2.2 mg/dL (ref 1.7–2.4)

## 2020-12-04 IMAGING — MR MR HEAD W/O CM
6 series · 48 of 48 positions shown · non-contrast
Comparison: CT head [DATE].

CLINICAL DATA: Anoxic brain damage following cardiac arrest.

EXAM:
MRI HEAD WITHOUT CONTRAST
TECHNIQUE: Multiplanar, multiecho pulse sequences of the brain and surrounding
structures were obtained without intravenous contrast.

[Series 2: ax dwi_tracew · axial · 3.0mm · 1.31mm/px · z∈[-32,+129]mm · 11 of 50 slices shown]
[im 1/50]
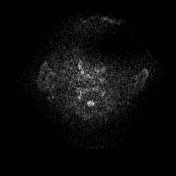
[im 5/50]
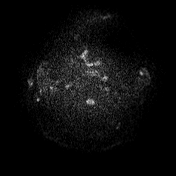
[im 10/50]
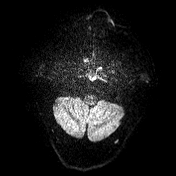
[im 15/50]
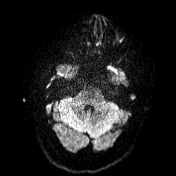
[im 20/50]
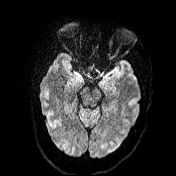
[im 25/50]
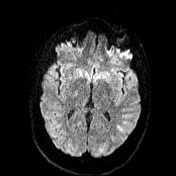
[im 30/50]
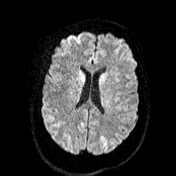
[im 35/50]
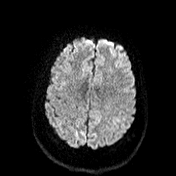
[im 40/50]
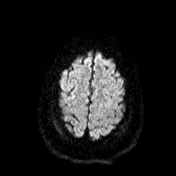
[im 45/50]
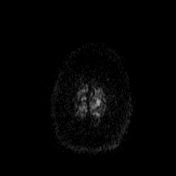
[im 50/50]
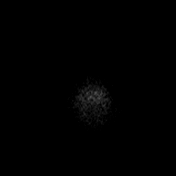

[Series 4: cor dwi_tracew · coronal · 5.0mm · 1.31mm/px · 9 of 38 slices shown]
[im 1/38]
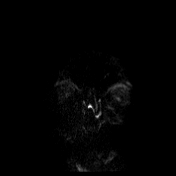
[im 5/38]
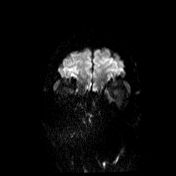
[im 10/38]
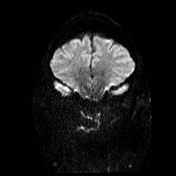
[im 14/38]
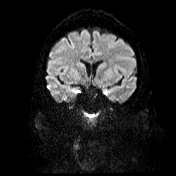
[im 19/38]
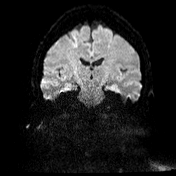
[im 24/38]
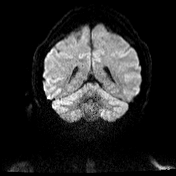
[im 28/38]
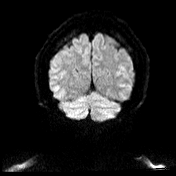
[im 33/38]
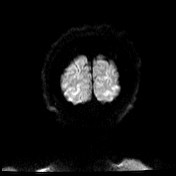
[im 38/38]
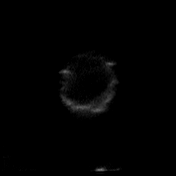

[Series 5: cor dwi_adc · coronal · 5.0mm · 1.31mm/px · 9 of 38 slices shown]
[im 1/38]
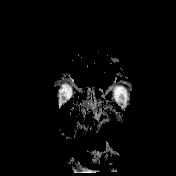
[im 5/38]
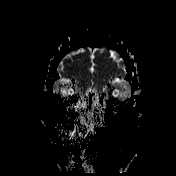
[im 10/38]
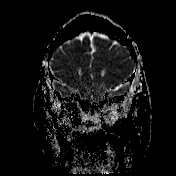
[im 14/38]
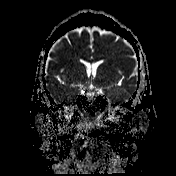
[im 19/38]
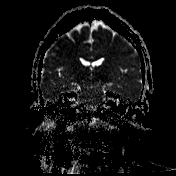
[im 24/38]
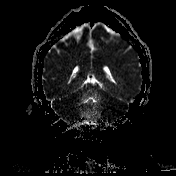
[im 28/38]
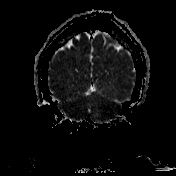
[im 33/38]
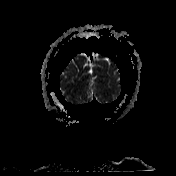
[im 38/38]
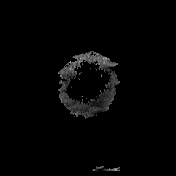

[Series 8: T1 · axial · 5.0mm · 0.90mm/px · z∈[-31,+125]mm · 6 of 27 slices shown]
[im 1/27]
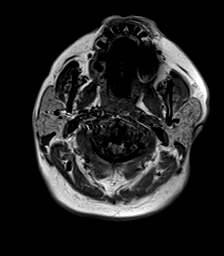
[im 6/27]
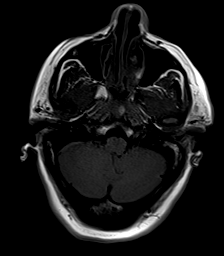
[im 11/27]
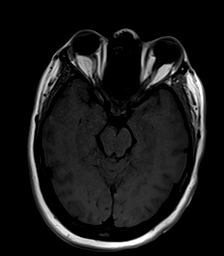
[im 16/27]
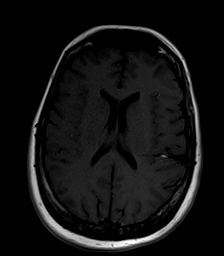
[im 21/27]
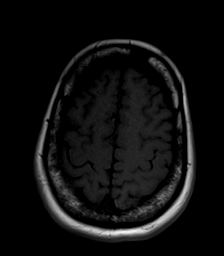
[im 27/27]
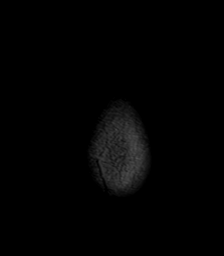

[Series 10: FLAIR · axial · 5.0mm · 1.20mm/px · z∈[-29,+126]mm · 6 of 27 slices shown]
[im 1/27]
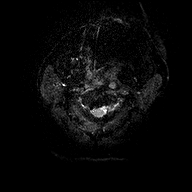
[im 6/27]
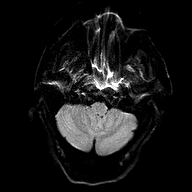
[im 11/27]
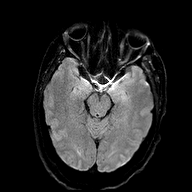
[im 16/27]
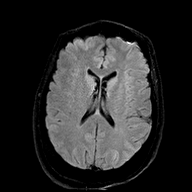
[im 21/27]
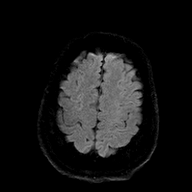
[im 27/27]
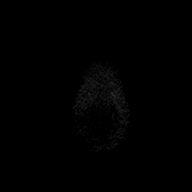

[Series 11: T2 · coronal · 5.0mm · 0.45mm/px · 7 of 31 slices shown]
[im 1/31]
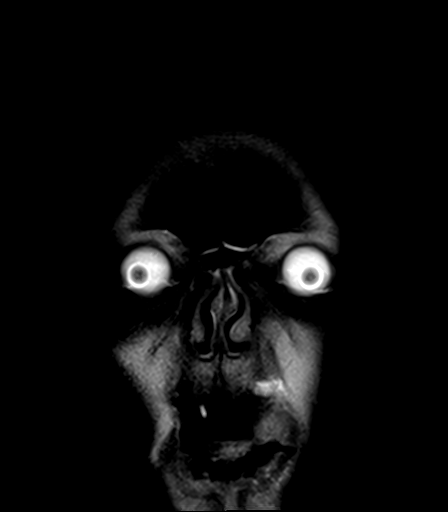
[im 6/31]
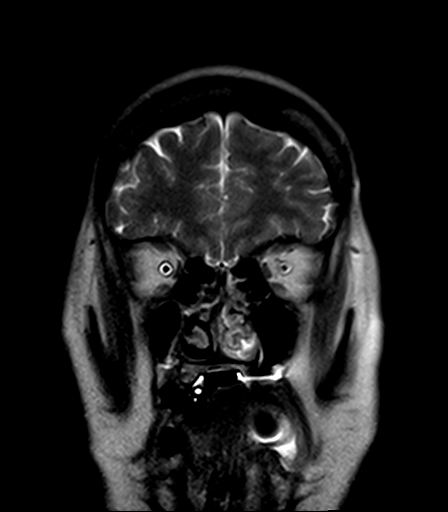
[im 11/31]
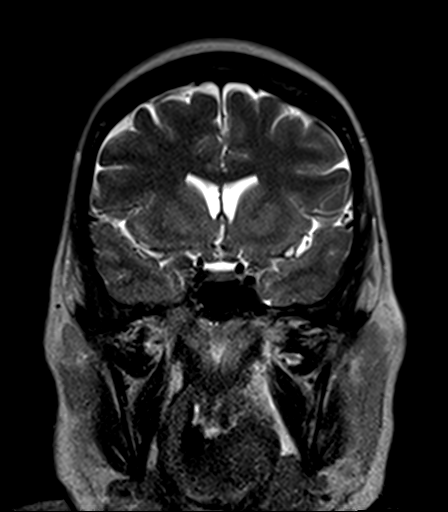
[im 16/31]
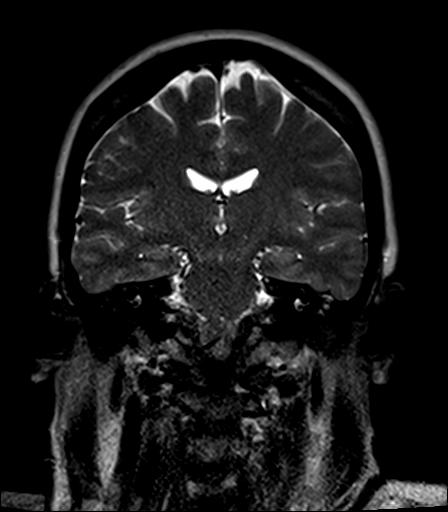
[im 21/31]
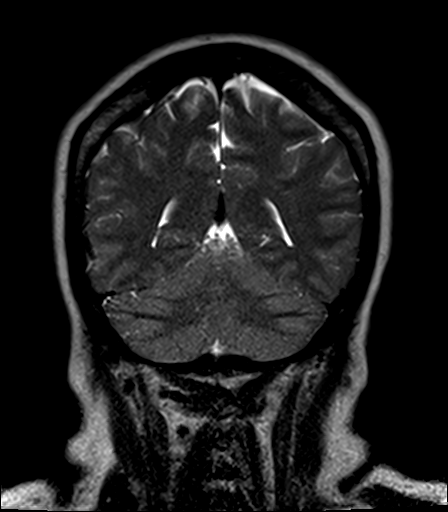
[im 26/31]
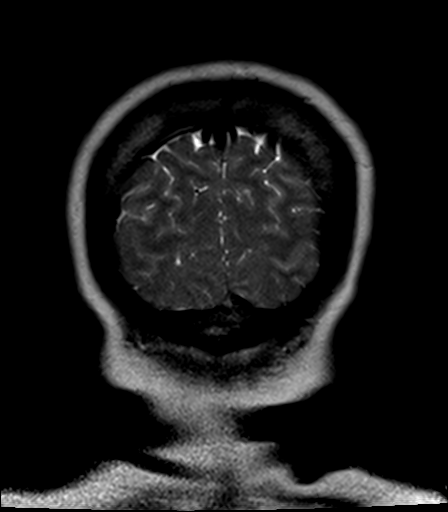
[im 31/31]
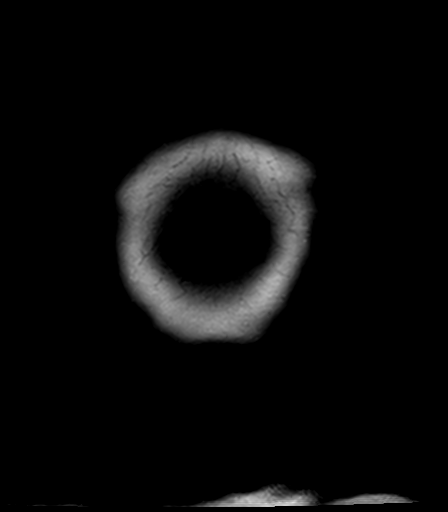

[48 of 48 positions shown; findings below may reference images not displayed]

FINDINGS: Brain: Ventricle size normal. Cerebral volume normal. Normal white
matter.

Negative for acute infarct.  Negative for hemorrhage or mass.

Vascular: Normal arterial flow voids.

Skull and upper cervical spine: Negative

Sinuses/Orbits: Mild mucosal edema paranasal sinuses. Negative
orbit.

Other: None
IMPRESSION: Normal MRI head

## 2020-12-04 MED ORDER — HYDRALAZINE HCL 50 MG PO TABS
25.0000 mg | ORAL_TABLET | Freq: Four times a day (QID) | ORAL | Status: DC | PRN
  Administered 2020-12-04 – 2020-12-10 (×6): 50 mg
  Administered 2020-12-11: 25 mg
  Administered 2020-12-14 – 2020-12-17 (×3): 50 mg
  Administered 2020-12-18: 25 mg
  Filled 2020-12-04 (×13): qty 1

## 2020-12-04 MED ORDER — VECURONIUM BROMIDE 10 MG IV SOLR
INTRAVENOUS | Status: AC
  Administered 2020-12-04: 10 mg via INTRAVENOUS
  Filled 2020-12-04: qty 10

## 2020-12-04 MED ORDER — VECURONIUM BROMIDE 10 MG IV SOLR
INTRAVENOUS | Status: AC
  Filled 2020-12-04: qty 10

## 2020-12-04 MED ORDER — POTASSIUM CHLORIDE 20 MEQ PO PACK
40.0000 meq | PACK | Freq: Once | ORAL | Status: AC
  Administered 2020-12-04: 40 meq
  Filled 2020-12-04: qty 2

## 2020-12-04 MED ORDER — AMLODIPINE BESYLATE 10 MG PO TABS
10.0000 mg | ORAL_TABLET | Freq: Every day | ORAL | Status: DC
  Administered 2020-12-05: 10 mg via ORAL
  Filled 2020-12-04: qty 1

## 2020-12-04 MED ORDER — AMLODIPINE BESYLATE 5 MG PO TABS: 5.0000 mg | ORAL_TABLET | Freq: Every day | ORAL | Status: DC

## 2020-12-04 MED ORDER — AMLODIPINE BESYLATE 5 MG PO TABS
5.0000 mg | ORAL_TABLET | Freq: Every day | ORAL | Status: DC
  Administered 2020-12-04: 5 mg via ORAL
  Filled 2020-12-04: qty 1

## 2020-12-04 MED ORDER — VECURONIUM BROMIDE 10 MG IV SOLR
10.0000 mg | Freq: Once | INTRAVENOUS | Status: AC
  Administered 2020-12-04: 10 mg via INTRAVENOUS

## 2020-12-04 MED ORDER — VECURONIUM BROMIDE 10 MG IV SOLR: 10.0000 mg | Freq: Once | INTRAVENOUS | Status: AC

## 2020-12-04 MED ORDER — POTASSIUM CHLORIDE 20 MEQ PO PACK
20.0000 meq | PACK | Freq: Once | ORAL | Status: AC
  Administered 2020-12-04: 20 meq
  Filled 2020-12-04: qty 1

## 2020-12-04 MED ORDER — AMLODIPINE BESYLATE 5 MG PO TABS
5.0000 mg | ORAL_TABLET | Freq: Once | ORAL | Status: AC
  Administered 2020-12-04: 5 mg via ORAL
  Filled 2020-12-04: qty 1

## 2020-12-04 MED FILL — Medication: Qty: 1 | Status: AC

## 2020-12-04 NOTE — Consult Note (Addendum)
NEURO HOSPITALIST CONSULT NOTE   Requestig physician: Dr. Mortimer Fries  Reason for Consult: Possible anoxic brain injury  History obtained from:  Chart     HPI:                                                                                                                                          [name] [name] is an 55 y.o. female with a PMHx of morbid obesity and HTN who presented on Monday morning after cardiac arrest at home. Per EMS, she received 2 shocks by FD prior to EMS arrival. After EMS arrived, she was noted to be in vfib - they gave one shock and one round of epi with ROSC. Total downtime is unknown, but she was seen by family an hour to being discovered at home. Her CBG with EMS was 166. On arrival to the ED her GCS was 3, pupils were 4 mm and reactive, but there were no motor or verbal responses. She was intubated in the ED. EKG showed no signs of STEMI or acute ischemia. ED work up revealed severe lactic acidosis, severe DKA and UA consistent with UTI. She is Covid negative. CT head showed no acute abnormality. She was started on hypothermia protocol.  This morning, the patient developed myoclonic jerking. Neurology was consulted to assess for possible anoxic brain injury. .   Past Medical History:  Diagnosis Date  . Hypertension     History reviewed. No pertinent surgical history.  Family History  Problem Relation Age of Onset  . Hypertension Mother   . Leukemia Mother   . Diabetes Mother   . Healthy Father               Social History:  reports that she quit smoking about 15 years ago. She has never used smokeless tobacco. She reports that she does not drink alcohol and does not use drugs.  No Known Allergies  MEDICATIONS:                                                                                                                     Scheduled: . [START ON 12/05/2020] amLODipine  10 mg Oral Daily  . chlorhexidine gluconate (MEDLINE KIT)  15 mL  Mouth Rinse BID  . Chlorhexidine Gluconate  Cloth  6 each Topical Daily  . feeding supplement (PROSource TF)  45 mL Per Tube BID  . feeding supplement (VITAL AF 1.2 CAL)  1,000 mL Per Tube Q24H  . heparin  5,000 Units Subcutaneous Q8H  . insulin aspart  0-15 Units Subcutaneous Q4H  . mouth rinse  15 mL Mouth Rinse 10 times per day  . multivitamin with minerals  1 tablet Per Tube Daily  . pantoprazole (PROTONIX) IV  40 mg Intravenous QHS  . potassium chloride  20 mEq Per Tube Once   Continuous: . sodium chloride 50 mL/hr at 12/04/20 0601  . sodium chloride 250 mL (12/03/20 0027)  . fentaNYL infusion INTRAVENOUS 75 mcg/hr (12/04/20 0601)  . norepinephrine (LEVOPHED) Adult infusion Stopped (12/03/20 1305)  . 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    Blood pressure 119/64, pulse (!) 103, temperature 99.5 F (37.5 C), temperature source Axillary, resp. rate 16, height 5' 0.98" (1.549 m), weight 92.4 kg, SpO2 98 %.   General Examination:                                                                                                       Physical Exam  HEENT-  [place]/AT  Lungs-Intubated Extremities- No edema  Neurological Examination Mental Status: Sedated on propofol at 20 and fentanyl at 100. Will open eyes to loud voice and clapping, but will not gaze towards sounds or away from bright light. No attempts to communicate. Not following commands. No spontaneous movement other than eye opening and furrowing of brow to noxious.  Cranial Nerves: II: No blink to threat. Pupils 2 mm and sluggishly reactive.   III,IV, VI: Eyes disconjugate with exotropia, near the midline. No nystagmus. No doll's eye reflex.  V,VII: Weak corneal reflexes. Furrows brow to noxious.  VIII: Opens eyes to loud  clap.  IX,X: Intubated XI: Unable to assess XII: Intubated Motor/Sensory: Flaccid tone x 4. No posturing or other movement to noxious.  Deep Tendon Reflexes: Brisk, low amplitude upper brachioradialis reflexes bilaterally. Right patellar 4+ with crossed adductor response. Left patellar low amplitude 3+. There are approximately 10 beats of clonus with forced dorsiflexion of each foot. Toes are briskly upgoing.  Cerebellar/Gait: Unable to assess  Lab Results: Basic Metabolic Panel: Recent Labs  Lab 12/02/20 0658 12/02/20 0936 12/02/20 0944 12/02/20 1658 12/03/20 0540 12/03/20 2012 12/04/20 0435  NA 141  --  142 145 142  --  141  K 2.1*  --  2.4* 3.8 3.5  --  3.4*  CL 100  --  103 105 105  --  108  CO2 20*  --  _0 --  26  GLUCOSE 357*  --  273* 142* 141*  --  127*  BUN 7  --  _1 --  24*  CREATININE 1.01*  --  0.92 0.96 1.06*  --  0.91  CALCIUM 8.5*  --  7.6* 7.8* 8.1*  --  8.2*  MG  --  2.0  --  1.8 2.7* 2.6* 2.2  PHOS  --  5.0*  --  2.4* 5.0* 5.7* 4.3    CBC: Recent Labs  Lab 12/02/20 0658 12/02/20 0944 12/04/20 0435  WBC 11.9* 14.8* 14.4*  NEUTROABS 5.5  --  11.8*  HGB 9.7* 9.7* 8.2*  HCT 33.3* 33.7* 28.3*  MCV 83.5 84.5 84.0  PLT 277 240 185    Cardiac Enzymes: No results for input(s): CKTOTAL, CKMB, CKMBINDEX, TROPONINI in the last 168 hours.  Lipid Panel: No results for input(s): CHOL, TRIG, HDL, CHOLHDL, VLDL, LDLCALC in the last 168 hours.  Imaging: EEG  Result Date: 12/04/2020 Lora Havens, MD     12/04/2020  4:45 PM Patient Name: [name] [name] MRN: [id number] Epilepsy Attending: Lora Havens Referring Physician/Provider: Marda Stalker, NP Date: 12/04/2020 Duration: 20.37 mins Patient history: 55 year old female status post cardiac arrest.  EEG to evaluate for seizures Level of alertness: comatose AEDs during EEG study: Propofol Technical aspects: This EEG study was done with scalp electrodes positioned according to the 10-20  International system of electrode placement. Electrical activity was acquired at a sampling rate of 500Hz and reviewed with a high frequency filter of 70Hz and a low frequency filter of 1Hz. EEG data were recorded continuously and digitally stored. Description: EEG showed continuous generalized 3 to 6 Hz theta-delta slowing. Hyperventilation and photic stimulation were not performed.   ABNORMALITY -Continuous slow, generalized IMPRESSION: This study is suggestive of severe diffuse encephalopathy, nonspecific etiology. No seizures or epileptiform discharges were seen throughout the recording [place]   DG Chest 1 View  Result Date: 12/03/2020 CLINICAL DATA:  Central line placement EXAM: CHEST  1 VIEW COMPARISON:  12/02/2020 FINDINGS: Support Apparatus: --Endotracheal tube: Tip at the level of the clavicular heads. --Enteric tube:Tip and sideport project over the stomach. --Catheter(s):Right internal jugular vein approach central venous catheter tip is in the lower SVC --Other: None The heart size and mediastinal contours are within normal limits. The lungs are clear. No pleural effusion or pneumothorax. IMPRESSION: Endotracheal tube tip at the level of the clavicular heads. Electronically Signed   By: Ulyses Jarred M.D.   On: 12/03/2020 02:17   MR BRAIN WO CONTRAST  Result Date: 12/04/2020 CLINICAL DATA:  Anoxic brain damage following cardiac arrest. EXAM: MRI HEAD WITHOUT CONTRAST TECHNIQUE: Multiplanar, multiecho pulse sequences of the brain and surrounding structures were obtained without intravenous contrast. COMPARISON:  CT head 12/02/2020. FINDINGS: Brain: Ventricle size normal. Cerebral volume normal. Normal white matter. Negative for acute infarct.  Negative for hemorrhage or mass. Vascular: Normal arterial flow voids. Skull and upper cervical spine: Negative Sinuses/Orbits: Mild mucosal edema paranasal sinuses. Negative orbit. Other: None IMPRESSION: Normal MRI head Electronically Signed  By: Franchot Gallo M.D.   On: 12/04/2020 09:00   EEG adult  Result Date: 12/02/2020 Lora Havens, MD     12/02/2020  5:47 PM Patient Name: [name] [name] MRN: [id number] Epilepsy Attending: Lora Havens Referring Physician/Provider: Dr Flora Lipps Date: 12/02/2020 Duration: 22.29 mins Patient history: 55yo F s/p cardiac arrest. EEG to evaluate for seizure Level of alertness:  comatose AEDs during EEG study: Propofol Technical aspects: This EEG study was done with scalp electrodes positioned according to the 10-20 International system of electrode placement. Electrical activity was acquired at a sampling rate of 500Hz  and reviewed with a high frequency filter of 70Hz  and a low frequency filter of 1Hz . EEG data were recorded continuously and digitally stored. Description: EEG showed continuous generalized 2-3hz  theta slowing admixed with 15-18hz  generalized beta activity. EEG was reactive to tactile stimulation. Hyperventilation and photic stimulation were not performed.   Of note parts of study were difficult to interpret due to myogenic artifact. ABNORMALITY -Continuous  slow, generalized IMPRESSION: This study is suggestive of severe diffuse encephalopathy, nonspecific etiology but likely related to sedation, anoxic/hypoxic brain injury. No seizures or epileptiform discharges were seen throughout the recording. Lora Havens   ECHOCARDIOGRAM COMPLETE  Result Date: 12/03/2020    ECHOCARDIOGRAM REPORT   Patient Name:   [name] [name] Date of Exam: 12/03/2020 Medical Rec #:  [id number]      Height:       61.0 in Accession #:    [id number]     Weight:       190.7 lb Date of Birth:  06/04/1966     BSA:          1.851 m Patient Age:    28 years       BP:           96/62 mmHg Patient Gender: F              HR:           58 bpm. Exam Location:  [facility] Procedure: 2D Echo, Color Doppler and Cardiac Doppler Indications:     Acute ischemic heart disease -unspecified I24.9  History:         Patient has no prior  history of Echocardiogram examinations.                  Risk Factors:Hypertension.  Sonographer:     Sherrie Sport RDCS (AE) Referring Phys:  [id number] Flora Lipps Diagnosing Phys: Nelva Bush MD  Sonographer Comments: Echo performed with patient supine and on artificial respirator. IMPRESSIONS  1. Left ventricular ejection fraction, by estimation, is 60 to 65%. The left ventricle has normal function. The left ventricle has no regional wall motion abnormalities. There is mild left ventricular hypertrophy. Left ventricular diastolic parameters were normal.  2. Right ventricular systolic function is normal. The right ventricular size is normal. Tricuspid regurgitation signal is inadequate for assessing PA pressure.  3. A small pericardial effusion is present. The pericardial effusion is posterior and lateral to the left ventricle.  4. The mitral valve is grossly normal. No evidence of mitral valve regurgitation. No evidence of mitral stenosis.  5. The aortic valve is tricuspid. Aortic valve regurgitation is not visualized. No aortic stenosis is present.  6. The inferior vena cava is normal in size with greater than 50% respiratory variability, suggesting right atrial pressure of 3 mmHg. FINDINGS  Left Ventricle: Left ventricular ejection fraction, by estimation, is 60 to 65%. The left ventricle  has normal function. The left ventricle has no regional wall motion abnormalities. The left ventricular internal cavity size was normal in size. There is  mild left ventricular hypertrophy. Left ventricular diastolic parameters were normal. Right Ventricle: The right ventricular size is normal. No increase in right ventricular wall thickness. Right ventricular systolic function is normal. Tricuspid regurgitation signal is inadequate for assessing PA pressure. Left Atrium: Left atrial size was normal in size. Right Atrium: Right atrial size was normal in size. Pericardium: A small pericardial effusion is present. The pericardial  effusion is posterior and lateral to the left ventricle. Mitral Valve: The mitral valve is grossly normal. No evidence of mitral valve regurgitation. No evidence of mitral valve stenosis. Tricuspid Valve: The tricuspid valve is grossly normal. Tricuspid valve regurgitation is trivial. Aortic Valve: The aortic valve is tricuspid. Aortic valve regurgitation is not visualized. No aortic stenosis is present. Aortic valve mean gradient measures 6.0 mmHg. Aortic valve peak gradient measures 9.2 mmHg. Aortic valve area, by VTI measures 2.44 cm. Pulmonic Valve: The pulmonic valve was not well visualized. Pulmonic valve regurgitation is not visualized. No evidence of pulmonic stenosis. Aorta: The aortic root and ascending aorta are structurally normal, with no evidence of dilitation. Pulmonary Artery: The pulmonary artery is of normal size. Venous: The inferior vena cava is normal in size with greater than 50% respiratory variability, suggesting right atrial pressure of 3 mmHg. IAS/Shunts: The interatrial septum was not well visualized.  LEFT VENTRICLE PLAX 2D LVIDd:         3.70 cm  Diastology LVIDs:         2.60 cm  LV e' medial:    9.57 cm/s LV PW:         1.20 cm  LV E/e' medial:  8.8 LV IVS:        1.05 cm  LV e' lateral:   10.20 cm/s LVOT diam:     2.00 cm  LV E/e' lateral: 8.3 LV SV:         69 LV SV Index:   37 LVOT Area:     3.14 cm  RIGHT VENTRICLE RV Basal diam:  3.30 cm RV S prime:     15.30 cm/s TAPSE (M-mode): 3.4 cm LEFT ATRIUM             Index       RIGHT ATRIUM           Index LA diam:        3.40 cm 1.84 cm/m  RA Area:     17.70 cm LA Vol (A2C):   31.0 ml 16.75 ml/m RA Volume:   49.50 ml  26.74 ml/m LA Vol (A4C):   44.3 ml 23.93 ml/m LA Biplane Vol: 38.1 ml 20.58 ml/m  AORTIC VALVE                    PULMONIC VALVE AV Area (Vmax):    2.06 cm     PV Vmax:        0.79 m/s AV Area (Vmean):   2.00 cm     PV Peak grad:   2.5 mmHg AV Area (VTI):     2.44 cm     RVOT Peak grad: 4 mmHg AV Vmax:            152.00 cm/s AV Vmean:          115.000 cm/s AV VTI:            0.282 m  AV Peak Grad:      9.2 mmHg AV Mean Grad:      6.0 mmHg LVOT Vmax:         99.90 cm/s LVOT Vmean:        73.200 cm/s LVOT VTI:          0.219 m LVOT/AV VTI ratio: 0.78  AORTA Ao Root diam: 2.97 cm MITRAL VALVE MV Area (PHT): 2.96 cm     SHUNTS MV Decel Time: 256 msec     Systemic VTI:  0.22 m MV E velocity: 84.40 cm/s   Systemic Diam: 2.00 cm MV A velocity: 105.00 cm/s MV E/A ratio:  0.80 Christopher End MD Electronically signed by Nelva Bush MD Signature Date/Time: 12/03/2020/6:35:57 PM    Final     Assessment: 55 year old female with myoclonic jerking and unresponsiveness following cardiac arrest.  1. Exam on sedation with propofol at 20 and fentanyl at 100 reveals no tracking of visual stimuli, not following commands, no attempts to communicate, flaccid upper and lower extremities without movement to noxious, hyperreflexia and positive Babinski sign bilaterally. She does open eyes to voice and furrow her brow to noxious stimuli, however, which would suggest a better prognosis than would be expected given the above exam findings. Given that she is sedated, the above findings cannot be used to further shed light regarding her long-term neurological prognosis.  2. MRI brain was read as normal by Radiology. 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  3. EEG with continuous generalized slowing, consistent with a severe diffuse encephalopathy.   Recommendations: 1. Continue IV sedation and wean slowly as able  2. If myoclonus recurs, load with Keppra 2000 mg and call Neurology.  3. Will need repeat neurological exam for prognosis after all sedation has been off for 72 hours.    40 minutes spent in the neurological evaluation and management  of this critically ill patient.   Electronically signed: Dr. Kerney Elbe 12/04/2020, 5:18 PM

## 2020-12-04 NOTE — Progress Notes (Signed)
eeg done °

## 2020-12-04 NOTE — Plan of Care (Signed)
  Problem: Nutrition: Goal: Adequate nutrition will be maintained Outcome: Progressing   

## 2020-12-04 NOTE — Progress Notes (Signed)
Notified by RT that patients peak pressures were in the 40s-50s. Came to bedside to assess patient. Patient clamped down on ETT despite bite block being in place. Unable to move jaw in an effort to open mouth. I was able to manipulate the ETT slightly to the left near a missing tooth that improved peak pressures to 30 briefly but then went back into the high 40s. Breath sounds with rhonchi but able to hear throughout. No significant amount of secretions. 10mg  of Vec given. Peak pressures improved to 21 shortly after being given.    Tonye Royalty ACNP-BC

## 2020-12-04 NOTE — Procedures (Signed)
Patient Name: [name] [name]  MRN: [id number]  Epilepsy Attending: Lora Havens  Referring Physician/Provider: Marda Stalker, NP Date: 12/04/2020 Duration: 20.37 mins  Patient history: 55 year old female status post cardiac arrest.  EEG to evaluate for seizures  Level of alertness: comatose  AEDs during EEG study: Propofol  Technical aspects: This EEG study was done with scalp electrodes positioned according to the 10-20 International system of electrode placement. Electrical activity was acquired at a sampling rate of 500Hz  and reviewed with a high frequency filter of 70Hz  and a low frequency filter of 1Hz . EEG data were recorded continuously and digitally stored.   Description: EEG showed continuous generalized 3 to 6 Hz theta-delta slowing. Hyperventilation and photic stimulation were not performed.     ABNORMALITY -Continuous slow, generalized  IMPRESSION: This study is suggestive of severe diffuse encephalopathy, nonspecific etiology. No seizures or epileptiform discharges were seen throughout the recording   [place]

## 2020-12-04 NOTE — Progress Notes (Signed)
CRITICAL CARE NOTE [name] with acute cardiac arrest Labs c/w severe acidosis and severe DKA Patient with witnessed V fib arrest and shocked in the field ROSC returned is unknown  1/10 admitted to ICU for cardiac arrest, started hypothermia protocol 1/11 hypothermia protocol +posturing 1/12 signs of brain damage, needs NEURO consultation  CC  follow up respiratory failure  SUBJECTIVE Patient remains critically ill Prognosis is guarded NEURO CONSULT PENDING    BP 135/72   Pulse (!) 101   Temp 97.9 F (36.6 C) (Bladder)   Resp 16   Ht 5' 0.98" (1.549 m)   Wt 92.4 kg   SpO2 99%   BMI 38.51 kg/m    I/O last 3 completed shifts: In: 3614.8 [I.V.:2769.6; NG/GT:695.2; IV Piggyback:150] Out: 875 [Urine:775; Emesis/NG output:100] No intake/output data recorded.  SpO2: 99 % FiO2 (%): 30 %  Estimated body mass index is 38.51 kg/m as calculated from the following:   Height as of this encounter: 5' 0.98" (1.549 m).   Weight as of this encounter: 92.4 kg.  SIGNIFICANT EVENTS   REVIEW OF SYSTEMS  PATIENT IS UNABLE TO PROVIDE COMPLETE REVIEW OF SYSTEMS DUE TO SEVERE CRITICAL ILLNESS        PHYSICAL EXAMINATION:  GENERAL:critically ill appearing, +resp distress HEAD: Normocephalic, atraumatic.  EYES: Pupils mildly reactive to light.  PULMONARY: +rhonchi, +wheezing CARDIOVASCULAR: S1 and S2. Regular rate and rhythm. No murmurs, rubs, or gallops.  GASTROINTESTINAL: Soft, nontender, -distended.  Positive bowel sounds.   MUSCULOSKELETAL: No swelling, clubbing, or edema.  NEUROLOGIC: obtunded, GCS<8 jerking and twitching, no gaga reflex SKIN:intact,warm,dry  MEDICATIONS: I have reviewed all medications and confirmed regimen as documented   CULTURE RESULTS   Recent Results (from the past 240 hour(s))  Resp Panel by RT-PCR (Flu A&B, Covid) Nasopharyngeal Swab     Status: None   Collection Time: 12/02/20  7:23 AM   Specimen: Nasopharyngeal Swab; Nasopharyngeal(NP)  swabs in vial transport medium  Result Value Ref Range Status   SARS Coronavirus 2 by RT PCR NEGATIVE NEGATIVE Final    Comment: (NOTE) SARS-CoV-2 target nucleic acids are NOT DETECTED.  The SARS-CoV-2 RNA is generally detectable in upper respiratory specimens during the acute phase of infection. The lowest concentration of SARS-CoV-2 viral copies this assay can detect is 138 copies/mL. A negative result does not preclude SARS-Cov-2 infection and should not be used as the sole basis for treatment or other patient management decisions. A negative result may occur with  improper specimen collection/handling, submission of specimen other than nasopharyngeal swab, presence of viral mutation(s) within the areas targeted by this assay, and inadequate number of viral copies(<138 copies/mL). A negative result must be combined with clinical observations, patient history, and epidemiological information. The expected result is Negative.  Fact Sheet for Patients:  EntrepreneurPulse.com.au  Fact Sheet for Healthcare Providers:  IncredibleEmployment.be  This test is no t yet approved or cleared by the [place] FDA and  has been authorized for detection and/or diagnosis of SARS-CoV-2 by FDA under an Emergency Use Authorization (EUA). This EUA will remain  in effect (meaning this test can be used) for the duration of the COVID-19 declaration under Section 564(b)(1) of the Act, 21 U.S.C.section 360bbb-3(b)(1), unless the authorization is terminated  or revoked sooner.       Influenza A by PCR NEGATIVE NEGATIVE Final   Influenza B by PCR NEGATIVE NEGATIVE Final    Comment: (NOTE) The Xpert Xpress SARS-CoV-2/FLU/RSV plus assay is intended as an aid in the  diagnosis of influenza from Nasopharyngeal swab specimens and should not be used as a sole basis for treatment. Nasal washings and aspirates are unacceptable for Xpert Xpress  SARS-CoV-2/FLU/RSV testing.  Fact Sheet for Patients: EntrepreneurPulse.com.au  Fact Sheet for Healthcare Providers: IncredibleEmployment.be  This test is not yet approved or cleared by the [place] FDA and has been authorized for detection and/or diagnosis of SARS-CoV-2 by FDA under an Emergency Use Authorization (EUA). This EUA will remain in effect (meaning this test can be used) for the duration of the COVID-19 declaration under Section 564(b)(1) of the Act, 21 U.S.C. section 360bbb-3(b)(1), unless the authorization is terminated or revoked.  Performed at [facility], [address] [place], [place] [address]   Urine culture     Status: None   Collection Time: 12/02/20  7:23 AM   Specimen: In/Out Cath Urine  Result Value Ref Range Status   Specimen Description   Final    IN/OUT CATH URINE Performed at [facility], [address] [place], [place] [address]    Special Requests   Final    NONE Performed at [facility], [address] [place], [place] [address]    Culture   Final    NO GROWTH Performed at [facility], [place] [address] [place], [place] [address]    Report Status 12/03/2020 FINAL  Final  Blood Culture (routine x 2)     Status: None (Preliminary result)   Collection Time: 12/02/20  8:37 AM   Specimen: BLOOD  Result Value Ref Range Status   Specimen Description BLOOD BLOOD RIGHT HAND  Final   Special Requests   Final    BOTTLES DRAWN AEROBIC AND ANAEROBIC Blood Culture adequate volume   Culture   Final    NO GROWTH 2 DAYS Performed at [facility], [address] [place], [place] [address]    Report Status PENDING  Incomplete  Blood Culture (routine x 2)     Status: None (Preliminary result)   Collection Time: 12/02/20  8:37 AM   Specimen: BLOOD  Result Value Ref Range Status   Specimen Description BLOOD RIGHT ARM  Final   Special Requests   Final    BOTTLES  DRAWN AEROBIC AND ANAEROBIC Blood Culture adequate volume   Culture   Final    NO GROWTH 2 DAYS Performed at [facility], [address], [place], [place] [address]    Report Status PENDING  Incomplete  MRSA PCR Screening     Status: None   Collection Time: 12/02/20  9:33 AM   Specimen: Nasopharyngeal  Result Value Ref Range Status   MRSA by PCR NEGATIVE NEGATIVE Final    Comment:        The GeneXpert MRSA Assay (FDA approved for NASAL specimens only), is one component of a comprehensive MRSA colonization surveillance program. It is not intended to diagnose MRSA infection nor to guide or monitor treatment for MRSA infections. Performed at [facility], [address] [place], [place] [address]           IMAGING    ECHOCARDIOGRAM COMPLETE  Result Date: 12/03/2020    ECHOCARDIOGRAM REPORT   Patient Name:   KINDELL STRADA Date of Exam: 12/03/2020 Medical Rec #:  [id number]      Height:       61.0 in Accession #:    [id number]     Weight:       190.7 lb Date of Birth:  1966-10-17     BSA:          1.851 m Patient Age:    59 years       BP:           96/62 mmHg Patient Gender: F              HR:           58 bpm. Exam Location:  [facility] Procedure: 2D Echo, Color Doppler and Cardiac Doppler Indications:     Acute ischemic heart disease -unspecified I24.9  History:         Patient has no prior history of Echocardiogram examinations.                  Risk Factors:Hypertension.  Sonographer:     Sherrie Sport RDCS (AE) Referring Phys:  [id number] Flora Lipps Diagnosing Phys: Nelva Bush MD  Sonographer Comments: Echo performed with patient supine and on artificial respirator. IMPRESSIONS  1. Left ventricular ejection fraction, by estimation, is 60 to 65%. The left ventricle has normal function. The left ventricle has no regional wall motion abnormalities. There is mild left ventricular hypertrophy. Left ventricular diastolic parameters were normal.  2. Right ventricular  systolic function is normal. The right ventricular size is normal. Tricuspid regurgitation signal is inadequate for assessing PA pressure.  3. A small pericardial effusion is present. The pericardial effusion is posterior and lateral to the left ventricle.  4. The mitral valve is grossly normal. No evidence of mitral valve regurgitation. No evidence of mitral stenosis.  5. The aortic valve is tricuspid. Aortic valve regurgitation is not visualized. No aortic stenosis is present.  6. The inferior vena cava is normal in size with greater than 50% respiratory variability, suggesting right atrial pressure of 3 mmHg. FINDINGS  Left Ventricle: Left ventricular ejection fraction, by estimation, is 60 to 65%. The left ventricle has normal function. The left ventricle has no regional wall motion abnormalities. The left ventricular internal cavity size was normal in size. There is  mild left ventricular hypertrophy. Left ventricular diastolic parameters were normal. Right Ventricle: The right ventricular size is normal. No increase in right ventricular wall thickness. Right ventricular systolic function is normal. Tricuspid regurgitation signal is inadequate for assessing PA pressure. Left Atrium: Left atrial size was normal in size. Right Atrium: Right atrial size was normal in size. Pericardium: A small pericardial effusion is present. The pericardial effusion is posterior and lateral to the left ventricle. Mitral Valve: The mitral valve is grossly normal. No evidence of mitral valve regurgitation. No evidence of mitral valve stenosis. Tricuspid Valve: The tricuspid valve is grossly normal. Tricuspid valve regurgitation is trivial. Aortic Valve: The aortic valve is tricuspid. Aortic valve regurgitation is not visualized. No aortic stenosis is present. Aortic valve mean gradient measures 6.0 mmHg. Aortic valve peak gradient measures 9.2 mmHg. Aortic valve area, by VTI measures 2.44 cm. Pulmonic Valve: The pulmonic valve was  not well visualized. Pulmonic valve regurgitation is not visualized. No evidence of pulmonic stenosis. Aorta: The aortic root and ascending aorta are structurally normal, with no evidence of dilitation. Pulmonary Artery: The pulmonary artery is of normal size. Venous: The inferior vena cava is normal in size with greater than 50% respiratory variability, suggesting right atrial pressure of 3 mmHg. IAS/Shunts: The interatrial septum was not well visualized.  LEFT VENTRICLE PLAX 2D LVIDd:         3.70 cm  Diastology LVIDs:  2.60 cm  LV e' medial:    9.57 cm/s LV PW:         1.20 cm  LV E/e' medial:  8.8 LV IVS:        1.05 cm  LV e' lateral:   10.20 cm/s LVOT diam:     2.00 cm  LV E/e' lateral: 8.3 LV SV:         69 LV SV Index:   37 LVOT Area:     3.14 cm  RIGHT VENTRICLE RV Basal diam:  3.30 cm RV S prime:     15.30 cm/s TAPSE (M-mode): 3.4 cm LEFT ATRIUM             Index       RIGHT ATRIUM           Index LA diam:        3.40 cm 1.84 cm/m  RA Area:     17.70 cm LA Vol (A2C):   31.0 ml 16.75 ml/m RA Volume:   49.50 ml  26.74 ml/m LA Vol (A4C):   44.3 ml 23.93 ml/m LA Biplane Vol: 38.1 ml 20.58 ml/m  AORTIC VALVE                    PULMONIC VALVE AV Area (Vmax):    2.06 cm     PV Vmax:        0.79 m/s AV Area (Vmean):   2.00 cm     PV Peak grad:   2.5 mmHg AV Area (VTI):     2.44 cm     RVOT Peak grad: 4 mmHg AV Vmax:           152.00 cm/s AV Vmean:          115.000 cm/s AV VTI:            0.282 m AV Peak Grad:      9.2 mmHg AV Mean Grad:      6.0 mmHg LVOT Vmax:         99.90 cm/s LVOT Vmean:        73.200 cm/s LVOT VTI:          0.219 m LVOT/AV VTI ratio: 0.78  AORTA Ao Root diam: 2.97 cm MITRAL VALVE MV Area (PHT): 2.96 cm     SHUNTS MV Decel Time: 256 msec     Systemic VTI:  0.22 m MV E velocity: 84.40 cm/s   Systemic Diam: 2.00 cm MV A velocity: 105.00 cm/s MV E/A ratio:  0.80 Christopher End MD Electronically signed by Nelva Bush MD Signature Date/Time: 12/03/2020/6:35:57 PM    Final       Nutrition Status: Nutrition Problem: Inadequate oral intake Etiology: inability to eat Signs/Symptoms: NPO status Interventions: Tube feeding,Prostat,MVI  CBC    Component Value Date/Time   WBC 14.4 (H) 12/04/2020 0435   RBC 3.37 (L) 12/04/2020 0435   HGB 8.2 (L) 12/04/2020 0435   HGB 11.6 (L) 02/20/2013 2053   HCT 28.3 (L) 12/04/2020 0435   HCT 34.3 (L) 02/20/2013 2053   PLT 185 12/04/2020 0435   PLT 213 02/20/2013 2053   MCV 84.0 12/04/2020 0435   MCV 93 02/20/2013 2053   MCH 24.3 (L) 12/04/2020 0435   MCHC 29.0 (L) 12/04/2020 0435   RDW 18.6 (H) 12/04/2020 0435   RDW 14.1 02/20/2013 2053   LYMPHSABS 1.5 12/04/2020 0435   MONOABS 0.9 12/04/2020 0435   EOSABS 0.1 12/04/2020 0435   BASOSABS 0.0  12/04/2020 0435   BMP Latest Ref Rng & Units 12/04/2020 12/03/2020 12/02/2020  Glucose 70 - 99 mg/dL 127(H) 141(H) 142(H)  BUN 6 - 20 mg/dL 24(H) 19 11  Creatinine 0.44 - 1.00 mg/dL 0.91 1.06(H) 0.96  Sodium 135 - 145 mmol/L 141 142 145  Potassium 3.5 - 5.1 mmol/L 3.4(L) 3.5 3.8  Chloride 98 - 111 mmol/L 108 105 105  CO2 22 - 32 mmol/L 26 24 24   Calcium 8.9 - 10.3 mg/dL 8.2(L) 8.1(L) 7.8(L)      Indwelling Urinary Catheter continued, requirement due to   Reason to continue Indwelling Urinary Catheter strict Intake/Output monitoring for hemodynamic instability   Central Line/ continued, requirement due to  Reason to continue [place] of central venous pressure or other hemodynamic parameters and poor IV access   Ventilator continued, requirement due to severe respiratory failure   Ventilator Sedation RASS 0 to -2      ASSESSMENT AND PLAN SYNOPSIS   55 yo obese [name] with acute Resp hypoxic resp failure due to severe cardiac arrest due to severe acidosis from DKA Continue  HYPOTHERMIA PROTOCOL 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   Will certainly impact respiratory mechanics, ventilator weaning Suspect will need to consider additional PEEP  NEUROLOGY Acute toxic metabolic encephalopathy, need for sedation Goal RASS -2 to -3 Signs of brain damage MRI pending   CARDIAC ICU monitoring   GI GI PROPHYLAXIS as indicated  NUTRITIONAL STATUS Nutrition Status: Nutrition Problem: Inadequate oral intake Etiology: inability to eat Signs/Symptoms: NPO status Interventions: Tube feeding,Prostat,MVI   DIET-->TF's as tolerated Constipation protocol as indicated  ENDO - will use ICU hypoglycemic\Hyperglycemia protocol if indicated     ELECTROLYTES -follow labs as needed -replace as needed -pharmacy consultation and following   DVT/GI PRX ordered and assessed TRANSFUSIONS AS NEEDED MONITOR FSBS I Assessed the need for Labs I Assessed the need for Foley I Assessed the need for Central Venous Line Family Discussion when available I Assessed the need for Mobilization I made an Assessment of medications to be adjusted accordingly Safety Risk assessment completed   CASE DISCUSSED IN MULTIDISCIPLINARY ROUNDS WITH ICU TEAM  Critical Care Time devoted to patient care services described in this note is 45 minutes.   Overall, patient is critically ill, prognosis is guarded.  Patient with Multiorgan failure and at high risk for cardiac arrest and death.    Corrin [place], M.D.  [facility]  Medical Director [place] Director [facility] Cardio-Pulmonary Department

## 2020-12-04 NOTE — Plan of Care (Signed)
Patient has developed myoclonic jerks. Has received 2 doses of 10mg  of vec this shift due to clamping down on ETT. Water temp on artic sun varying water temps to keep patients temperature controlled. Patient remains off of levo. Patient SR, SB. [place] called this shift as patient has had a decline in neuro status, not responding to pain, loss of gag reflex and impaired corneal reflexes.

## 2020-12-04 NOTE — Progress Notes (Signed)
[place] for Electrolyte Monitoring and Replacement   Recent Labs: Potassium (mmol/L)  Date Value  12/04/2020 3.4 (L)  02/20/2013 3.4 (L)   Magnesium (mg/dL)  Date Value  12/04/2020 2.2   Calcium (mg/dL)  Date Value  12/04/2020 8.2 (L)   Calcium, Total (mg/dL)  Date Value  02/20/2013 8.7   Albumin (g/dL)  Date Value  12/04/2020 2.9 (L)  02/20/2013 3.2 (L)   Phosphorus (mg/dL)  Date Value  12/04/2020 4.3   Sodium (mmol/L)  Date Value  12/04/2020 141  02/20/2013 137     Assessment: 55 year old female presented to the ED via EMS post witnessed cardiac arrest. ROSC achieved following 3 defibrillations and one round of epinephrine. Patient found to have significant hypokalemia on arrival. Given witnessed arrest, patient Code ICE. Pharmacy to manage electrolytes.    MIVF: NS at 50 mL/hr  Goal of Therapy:  Electrolytes WNL, K ~ 4 and Mg ~ 2 given cardiac events  Plan:  Potassium 40 mEq per tube this morning. Will give another 20 mEq this evening for a total of 60 mEq potassium replacement. Follow up electrolytes with morning labs.  Tawnya Crook, PharmD 12/04/2020 11:32 AM

## 2020-12-05 DIAGNOSIS — E0811 Diabetes mellitus due to underlying condition with ketoacidosis with coma: Secondary | ICD-10-CM

## 2020-12-05 DIAGNOSIS — Z515 Encounter for palliative care: Secondary | ICD-10-CM

## 2020-12-05 DIAGNOSIS — Z7189 Other specified counseling: Secondary | ICD-10-CM

## 2020-12-05 LAB — CBC WITH DIFFERENTIAL/PLATELET
Abs Immature Granulocytes: 0.04 10*3/uL (ref 0.00–0.07)
Basophils Absolute: 0 10*3/uL (ref 0.0–0.1)
Basophils Relative: 0 %
Eosinophils Absolute: 0.1 10*3/uL (ref 0.0–0.5)
Eosinophils Relative: 1 %
HCT: 23.7 % — ABNORMAL LOW (ref 36.0–46.0)
Hemoglobin: 7 g/dL — ABNORMAL LOW (ref 12.0–15.0)
Immature Granulocytes: 0 %
Lymphocytes Relative: 14 %
Lymphs Abs: 1.3 10*3/uL (ref 0.7–4.0)
MCH: 24.5 pg — ABNORMAL LOW (ref 26.0–34.0)
MCHC: 29.5 g/dL — ABNORMAL LOW (ref 30.0–36.0)
MCV: 82.9 fL (ref 80.0–100.0)
Monocytes Absolute: 0.7 10*3/uL (ref 0.1–1.0)
Monocytes Relative: 8 %
Neutro Abs: 6.8 10*3/uL (ref 1.7–7.7)
Neutrophils Relative %: 77 %
Platelets: 180 10*3/uL (ref 150–400)
RBC: 2.86 MIL/uL — ABNORMAL LOW (ref 3.87–5.11)
RDW: 18.9 % — ABNORMAL HIGH (ref 11.5–15.5)
WBC: 9 10*3/uL (ref 4.0–10.5)
nRBC: 0 % (ref 0.0–0.2)

## 2020-12-05 LAB — GLUCOSE, CAPILLARY
Glucose-Capillary: 117 mg/dL — ABNORMAL HIGH (ref 70–99)
Glucose-Capillary: 110 mg/dL — ABNORMAL HIGH (ref 70–99)
Glucose-Capillary: 112 mg/dL — ABNORMAL HIGH (ref 70–99)
Glucose-Capillary: 127 mg/dL — ABNORMAL HIGH (ref 70–99)
Glucose-Capillary: 119 mg/dL — ABNORMAL HIGH (ref 70–99)
Glucose-Capillary: 97 mg/dL (ref 70–99)
Glucose-Capillary: 105 mg/dL — ABNORMAL HIGH (ref 70–99)
Glucose-Capillary: 110 mg/dL — ABNORMAL HIGH (ref 70–99)

## 2020-12-05 LAB — BASIC METABOLIC PANEL
Anion gap: 9 (ref 5–15)
BUN: 21 mg/dL — ABNORMAL HIGH (ref 6–20)
CO2: 26 mmol/L (ref 22–32)
Calcium: 8.5 mg/dL — ABNORMAL LOW (ref 8.9–10.3)
Chloride: 111 mmol/L (ref 98–111)
Creatinine, Ser: 0.91 mg/dL (ref 0.44–1.00)
GFR, Estimated: >60 mL/min (ref >60)
Glucose, Bld: 128 mg/dL — ABNORMAL HIGH (ref 70–99)
Potassium: 3.3 mmol/L — ABNORMAL LOW (ref 3.5–5.1)
Sodium: 146 mmol/L — ABNORMAL HIGH (ref 135–145)

## 2020-12-05 LAB — MAGNESIUM: Magnesium: 2.1 mg/dL (ref 1.7–2.4)

## 2020-12-05 LAB — PHOSPHORUS: Phosphorus: 3.1 mg/dL (ref 2.5–4.6)

## 2020-12-05 MED ORDER — POTASSIUM CHLORIDE 20 MEQ PO PACK
40.0000 meq | PACK | ORAL | Status: AC
  Administered 2020-12-05 (×2): 40 meq
  Filled 2020-12-05 (×2): qty 2

## 2020-12-05 MED ORDER — LEVETIRACETAM IN NACL 1000 MG/100ML IV SOLN
2000.0000 mg | Freq: Once | INTRAVENOUS | Status: DC
  Filled 2020-12-05: qty 200

## 2020-12-05 MED ORDER — SODIUM CHLORIDE 0.9 % IV SOLN
2000.0000 mg | Freq: Once | INTRAVENOUS | Status: DC
  Filled 2020-12-05: qty 20

## 2020-12-05 MED ORDER — POTASSIUM CHLORIDE 20 MEQ PO PACK
20.0000 meq | PACK | Freq: Once | ORAL | Status: AC
  Administered 2020-12-05: 20 meq
  Filled 2020-12-05: qty 1

## 2020-12-05 MED ORDER — AMLODIPINE BESYLATE 10 MG PO TABS
10.0000 mg | ORAL_TABLET | Freq: Every day | ORAL | Status: DC
  Administered 2020-12-06 – 2021-01-10 (×34): 10 mg
  Filled 2020-12-05 (×35): qty 1

## 2020-12-05 MED ORDER — LEVETIRACETAM IN NACL 1000 MG/100ML IV SOLN
1000.0000 mg | INTRAVENOUS | Status: AC
  Administered 2020-12-05 (×2): 1000 mg via INTRAVENOUS
  Filled 2020-12-05: qty 100

## 2020-12-05 NOTE — Consult Note (Signed)
Consultation Note Date: 12/05/2020   Patient Name: [name] [name]  DOB: 07-23-1966  MRN: [id number]  Age / Sex: 55 y.o., female  PCP: [name] Paradise, MD Referring Physician: Erin Fulling, MD  Reason for Consultation: Establishing goals of care  HPI/Patient Profile: 55 y.o. female  with past medical history of HTN admitted on 12/02/2020 with v fib arrest. Hypothermia protocol initiated.  Developed myoclonic jerking 1/12. Unable to follow commands. Per neuro, MRI consistent with anoxic brain injury. PMT consulted to discuss GOC.   Clinical Assessment and Goals of Care: I have reviewed medical records including EPIC notes, labs and imaging, received report from [name] [name], [place] and Dr. Belia [name], assessed the patient and then met with patient's spouse [name] [name] to discuss diagnosis prognosis, GOC, EOL wishes, disposition and options.  I introduced Palliative Medicine as specialized medical care for people living with serious illness. It focuses on providing relief from the symptoms and stress of a serious illness. The goal is to improve quality of life for both the patient and the family.  We discussed a brief life review of the patient. [name] [name] tells me they have been married for 35 years. He tells me they have 5 children and multiple grandchildren. 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   As far as functional and nutritional status, he tells me [name] [name] was doing great - very healthy. No health concerns. He tells me current situation is incredibly shocking.   [name] [name] reviews the events of Mon morning - doing CPR and calling 911. Tells me he was told she had passed away and tells me how devastating this was.    We discussed patient's current illness and  what it means in the larger context of patient's on-going co-morbidities.  We discuss [name] [name]'s current state - review neuro's MRI interpretation of brain injury. We discuss inability to follow commands and dependence on mechanical ventilator.   I attempted to elicit values and goals of care important to the patient.  [name] [name] tells me this is not something they have ever discussed. We discuss keeping [name] [name] at the center of decision making and trying to make decisions for her that she would make for herself.   The difference between aggressive medical intervention and comfort care was considered in light of the patient's goals of care. At this time, [name] [name] is interested in all aggressive interventions offered to prolong life. He does share that he does not that he could ever make the decision to free [name] [name] from the ventilator.   [name] [name] tells me he does not think [name] [name] would benefit from more CPR if she were to arrest again. He shares he worries it would only cause her condition to worsen. I shared with him that I agreed with this and recommended no CPR.   Discussed with [name] [name] the importance of continued conversation with family and the medical providers regarding overall plan of care and treatment options, ensuring decisions are within the context of the patient's values and GOCs.    Questions and concerns were addressed. The family was encouraged to call with questions or concerns.    Primary Decision Maker NEXT OF KIN - spouse [name] [name]    SUMMARY OF RECOMMENDATIONS   - code status changed to partial -  NO CPR - interested in all other interventions offered - tells me he does not think he could make decision to withdraw care - will follow  Code Status/Advance Care Planning:  Limited code   Discharge Planning: To Be Determined      Primary Diagnoses: Present on Admission: . Cardiac arrest ([place])   I have reviewed the medical record, interviewed the patient and family,  and examined the patient. The following aspects are pertinent.  Past Medical History:  Diagnosis Date  . Hypertension    Social History   Socioeconomic History  . Marital status: Married    Spouse name: Not on file  . Number of children: Not on file  . Years of education: Not on file  . Highest education level: Not on file  Occupational History  . Not on file  Tobacco Use  . Smoking status: Former Smoker    Quit date: 2007    Years since quitting: 15.0  . Smokeless tobacco: Never Used  Vaping Use  . Vaping Use: Never used  Substance and Sexual Activity  . Alcohol use: No  . Drug use: Never  . Sexual activity: Not on file  Other Topics Concern  . Not on file  Social History Narrative  . Not on file   Social Determinants of Health   Financial Resource Strain: Not on file  Food Insecurity: Not on file  Transportation Needs: Not on file  Physical Activity: Not on file  Stress: Not on file  Social Connections: Not on file   Family History  Problem Relation Age of Onset  . Hypertension Mother   . Leukemia Mother   . Diabetes Mother   . Healthy Father    Scheduled Meds: . [START ON 12/06/2020] amLODipine  10 mg Per Tube Daily  . chlorhexidine gluconate (MEDLINE KIT)  15 mL Mouth Rinse BID  . Chlorhexidine Gluconate Cloth  6 each Topical Daily  . feeding supplement (PROSource TF)  45 mL Per Tube BID  . feeding supplement (VITAL AF 1.2 CAL)  1,000 mL Per Tube Q24H  . heparin  5,000 Units Subcutaneous Q8H  . insulin aspart  0-15 Units Subcutaneous Q4H  . mouth rinse  15 mL Mouth Rinse 10 times per day  . multivitamin with minerals  1 tablet Per Tube Daily  . pantoprazole (PROTONIX) IV  40 mg Intravenous QHS  . potassium chloride  20 mEq Per Tube Once  . potassium chloride  40 mEq Per Tube Q4H   Continuous Infusions: . sodium chloride 50 mL/hr at 12/04/20 2343  . sodium chloride 250 mL (12/03/20 0027)  . fentaNYL infusion INTRAVENOUS 100 mcg/hr (12/04/20 2358)  .  norepinephrine (LEVOPHED) Adult infusion Stopped (12/03/20 1305)  . propofol (DIPRIVAN) infusion 35 mcg/kg/min (12/05/20 1314)   PRN Meds:.fentaNYL, hydrALAZINE, midazolam No Known Allergies Review of Systems  Unable to perform ROS: Intubated    Physical Exam Constitutional:      Interventions: She is sedated and intubated.  Cardiovascular:     Rate and Rhythm: Normal rate and regular rhythm.  Pulmonary:     Effort: She is intubated.  Skin:    General: Skin is warm.  Neurological:     Mental Status: She is unresponsive.     Vital Signs: BP (!) 180/87   Pulse (!) 123   Temp 99.5 F (37.5 C)   Resp 20   Ht 5' 0.98" (1.549 m)   Wt 93.4 kg   SpO2 97%   BMI 38.93 kg/m  Pain Scale: CPOT       SpO2: SpO2: 97 % O2 Device:SpO2: 97 % O2 Flow Rate: .   IO: Intake/output summary:   Intake/Output Summary (Last 24 hours) at 12/05/2020 1355 Last data filed at 12/05/2020 1241 Gross per 24 hour  Intake 2496.66 ml  Output 710 ml  Net 1786.66 ml    LBM: Last BM Date:  (PTA) Baseline Weight: Weight: 90 kg Most recent weight: Weight: 93.4 kg     Palliative Assessment/Data:    Time Total: 85 minutes Greater than 50%  of this time was spent counseling and coordinating care related to the above assessment and plan.  Juel Burrow, DNP, AGNP-C Palliative Medicine Team [phone] Pager: [phone]

## 2020-12-05 NOTE — Progress Notes (Signed)
CRITICAL CARE NOTE  [name] with acute cardiac arrest Labs c/w severe acidosis and severe DKA Patient with witnessed V fib arrest and shocked in the field ROSC returned is unknown  1/10 admitted to ICU for cardiac arrest, started hypothermia protocol 1/11 hypothermia protocol +posturing 1/12 signs of brain damage, needs NEURO consultation 1/13 failed SAT/SBT, unable to follow commands     CC  follow up respiratory failure  SUBJECTIVE Patient remains critically ill Prognosis is guarded Signs of brain damage   BP 137/76   Pulse 89   Temp 99.5 F (37.5 C)   Resp 15   Ht 5' 0.98" (1.549 m)   Wt 93.4 kg   SpO2 98%   BMI 38.93 kg/m    I/O last 3 completed shifts: In: 3526.3 [I.V.:2442.3; NG/GT:984; IV Piggyback:100] Out: 1580 [Urine:1580] No intake/output data recorded.  SpO2: 98 % FiO2 (%): 30 %  Estimated body mass index is 38.93 kg/m as calculated from the following:   Height as of this encounter: 5' 0.98" (1.549 m).   Weight as of this encounter: 93.4 kg.  SIGNIFICANT EVENTS   REVIEW OF SYSTEMS  PATIENT IS UNABLE TO PROVIDE COMPLETE REVIEW OF SYSTEMS DUE TO SEVERE CRITICAL ILLNESS        PHYSICAL EXAMINATION:  GENERAL:critically ill appearing, +resp distress HEAD: Normocephalic, atraumatic.  EYES: Pupils equal, round, reactive to light.  No scleral icterus.  MOUTH: Moist mucosal membrane. NECK: Supple.  PULMONARY: +rhonchi, +wheezing CARDIOVASCULAR: S1 and S2. Regular rate and rhythm. No murmurs, rubs, or gallops.  GASTROINTESTINAL: Soft, nontender, -distended.  Positive bowel sounds.   MUSCULOSKELETAL: No swelling, clubbing, or edema.  NEUROLOGIC: obtunded, GCS<8 SKIN:intact,warm,dry  MEDICATIONS: I have reviewed all medications and confirmed regimen as documented   CULTURE RESULTS   Recent Results (from the past 240 hour(s))  Resp Panel by RT-PCR (Flu A&B, Covid) Nasopharyngeal Swab     Status: None   Collection Time: 12/02/20  7:23  AM   Specimen: Nasopharyngeal Swab; Nasopharyngeal(NP) swabs in vial transport medium  Result Value Ref Range Status   SARS Coronavirus 2 by RT PCR NEGATIVE NEGATIVE Final    Comment: (NOTE) SARS-CoV-2 target nucleic acids are NOT DETECTED.  The SARS-CoV-2 RNA is generally detectable in upper respiratory specimens during the acute phase of infection. The lowest concentration of SARS-CoV-2 viral copies this assay can detect is 138 copies/mL. A negative result does not preclude SARS-Cov-2 infection and should not be used as the sole basis for treatment or other patient management decisions. A negative result may occur with  improper specimen collection/handling, submission of specimen other than nasopharyngeal swab, presence of viral mutation(s) within the areas targeted by this assay, and inadequate number of viral copies(<138 copies/mL). A negative result must be combined with clinical observations, patient history, and epidemiological information. The expected result is Negative.  Fact Sheet for Patients:  EntrepreneurPulse.com.au  Fact Sheet for Healthcare Providers:  IncredibleEmployment.be  This test is no t yet approved or cleared by the [place] FDA and  has been authorized for detection and/or diagnosis of SARS-CoV-2 by FDA under an Emergency Use Authorization (EUA). This EUA will remain  in effect (meaning this test can be used) for the duration of the COVID-19 declaration under Section 564(b)(1) of the Act, 21 U.S.C.section 360bbb-3(b)(1), unless the authorization is terminated  or revoked sooner.       Influenza A by PCR NEGATIVE NEGATIVE Final   Influenza B by PCR NEGATIVE NEGATIVE Final    Comment: (NOTE)  The Xpert Xpress SARS-CoV-2/FLU/RSV plus assay is intended as an aid in the diagnosis of influenza from Nasopharyngeal swab specimens and should not be used as a sole basis for treatment. Nasal washings and aspirates are  unacceptable for Xpert Xpress SARS-CoV-2/FLU/RSV testing.  Fact Sheet for Patients: EntrepreneurPulse.com.au  Fact Sheet for Healthcare Providers: IncredibleEmployment.be  This test is not yet approved or cleared by the [place] FDA and has been authorized for detection and/or diagnosis of SARS-CoV-2 by FDA under an Emergency Use Authorization (EUA). This EUA will remain in effect (meaning this test can be used) for the duration of the COVID-19 declaration under Section 564(b)(1) of the Act, 21 U.S.C. section 360bbb-3(b)(1), unless the authorization is terminated or revoked.  Performed at [facility], [address], [place], [place] [address]   Urine culture     Status: None   Collection Time: 12/02/20  7:23 AM   Specimen: In/Out Cath Urine  Result Value Ref Range Status   Specimen Description   Final    IN/OUT CATH URINE Performed at [facility], [address] [place], [place] [address]    Special Requests   Final    NONE Performed at [facility], [address], [place], [place] [address]    Culture   Final    NO GROWTH Performed at [facility], [place] [address], [place], [place] [address]    Report Status 12/03/2020 FINAL  Final  Blood Culture (routine x 2)     Status: None (Preliminary result)   Collection Time: 12/02/20  8:37 AM   Specimen: BLOOD  Result Value Ref Range Status   Specimen Description BLOOD BLOOD RIGHT HAND  Final   Special Requests   Final    BOTTLES DRAWN AEROBIC AND ANAEROBIC Blood Culture adequate volume   Culture   Final    NO GROWTH 3 DAYS Performed at [facility], [address] [place], [place] [address]    Report Status PENDING  Incomplete  Blood Culture (routine x 2)     Status: None (Preliminary result)   Collection Time: 12/02/20  8:37 AM   Specimen: BLOOD  Result Value Ref Range Status   Specimen Description BLOOD RIGHT ARM  Final    Special Requests   Final    BOTTLES DRAWN AEROBIC AND ANAEROBIC Blood Culture adequate volume   Culture   Final    NO GROWTH 3 DAYS Performed at [facility], [address] [place], [place] [address]    Report Status PENDING  Incomplete  MRSA PCR Screening     Status: None   Collection Time: 12/02/20  9:33 AM   Specimen: Nasopharyngeal  Result Value Ref Range Status   MRSA by PCR NEGATIVE NEGATIVE Final    Comment:        The GeneXpert MRSA Assay (FDA approved for NASAL specimens only), is one component of a comprehensive MRSA colonization surveillance program. It is not intended to diagnose MRSA infection nor to guide or monitor treatment for MRSA infections. Performed at [facility], [address] [place], [place] [address]           IMAGING    EEG  Result Date: 12/04/2020 Lora Havens, MD     12/04/2020  4:45 PM Patient Name: SELISA TENSLEY MRN: [id number] Epilepsy Attending: Lora Havens Referring Physician/Provider: Marda Stalker, NP Date: 12/04/2020 Duration: 20.37 mins Patient history: 55 year old female status post cardiac arrest.  EEG to evaluate for  seizures Level of alertness: comatose AEDs during EEG study: Propofol Technical aspects: This EEG study was done with scalp electrodes positioned according to the 10-20 International system of electrode placement. Electrical activity was acquired at a sampling rate of 500Hz  and reviewed with a high frequency filter of 70Hz  and a low frequency filter of 1Hz . EEG data were recorded continuously and digitally stored. Description: EEG showed continuous generalized 3 to 6 Hz theta-delta slowing. Hyperventilation and photic stimulation were not performed.   ABNORMALITY -Continuous slow, generalized IMPRESSION: This study is suggestive of severe diffuse encephalopathy, nonspecific etiology. No seizures or epileptiform discharges were seen throughout the recording [place]     Nutrition  Status: Nutrition Problem: Inadequate oral intake Etiology: inability to eat Signs/Symptoms: NPO status Interventions: Tube feeding,Prostat,MVI     Indwelling Urinary Catheter continued, requirement due to   Reason to continue Indwelling Urinary Catheter strict Intake/Output monitoring for hemodynamic instability   Central Line/ continued, requirement due to  Reason to continue [place] of central venous pressure or other hemodynamic parameters and poor IV access   Ventilator continued, requirement due to severe respiratory failure   Ventilator Sedation RASS 0 to -2      ASSESSMENT AND PLAN SYNOPSIS  55 yo obese [name] with acute Resp hypoxic resp failure due to severe cardiac arrest due to severe acidosis from DKA ContinueHYPOTHERMIA PROTOCOL 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   Will certainly impact respiratory mechanics, ventilator weaning Suspect will need to consider additional PEEP  ACUTE KIDNEY INJURY/Renal Failure -continue Foley Catheter-assess need -Avoid nephrotoxic agents -Follow urine output, BMP -Ensure adequate renal perfusion, optimize oxygenation -Renal dose medications     NEUROLOGY Acute toxic metabolic encephalopathy Follow up [place] ICU monitoring   GI GI PROPHYLAXIS as indicated  DIET-->TF's as tolerated Constipation protocol as indicated  ENDO - will use ICU hypoglycemic\Hyperglycemia protocol if indicated     ELECTROLYTES -follow labs as needed -replace as needed -pharmacy consultation and following   DVT/GI PRX ordered and assessed TRANSFUSIONS AS NEEDED MONITOR FSBS I Assessed the need for Labs I Assessed the need for Foley I Assessed the need for Central  Venous Line Family Discussion when available I Assessed the need for Mobilization I made an Assessment of medications to be adjusted accordingly Safety Risk assessment completed   CASE DISCUSSED IN MULTIDISCIPLINARY ROUNDS WITH ICU TEAM  Critical Care Time devoted to patient care services described in this note is 55 minutes.   Overall, patient is critically ill, prognosis is guarded.  Patient with Multiorgan failure and at high risk for cardiac arrest and death.    Corrin Parker, M.D.  [facility]  Medical Director [place] Director [facility] Cardio-Pulmonary Department

## 2020-12-05 NOTE — Progress Notes (Signed)
[place] for Electrolyte Monitoring and Replacement   Recent Labs: Potassium (mmol/L)  Date Value  12/05/2020 3.3 (L)  02/20/2013 3.4 (L)   Magnesium (mg/dL)  Date Value  12/05/2020 2.1   Calcium (mg/dL)  Date Value  12/05/2020 8.5 (L)   Calcium, Total (mg/dL)  Date Value  02/20/2013 8.7   Albumin (g/dL)  Date Value  12/04/2020 2.9 (L)  02/20/2013 3.2 (L)   Phosphorus (mg/dL)  Date Value  12/05/2020 3.1   Sodium (mmol/L)  Date Value  12/05/2020 146 (H)  02/20/2013 137     Assessment: 55 year old female presented to the ED via EMS post witnessed cardiac arrest. ROSC achieved following 3 defibrillations and one round of epinephrine. Patient found to have significant hypokalemia on arrival. Given witnessed arrest, patient Code ICE. Pharmacy to manage electrolytes.    MIVF: NS at 50 mL/hr  Goal of Therapy:  Electrolytes WNL, K ~ 4 and Mg ~ 2 given cardiac events  Plan:  Potassium continues to trend down despite daily replacement. Will give potassium 40 mEq per tube x 2 doses as well as 20 mEq this evening for a total of 100 mEq replacement today. Recheck electrolytes with morning labs.  Tawnya Crook, PharmD 12/05/2020 2:23 PM

## 2020-12-06 LAB — BASIC METABOLIC PANEL
Anion gap: 6 (ref 5–15)
BUN: 24 mg/dL — ABNORMAL HIGH (ref 6–20)
CO2: 26 mmol/L (ref 22–32)
Calcium: 8.6 mg/dL — ABNORMAL LOW (ref 8.9–10.3)
Chloride: 115 mmol/L — ABNORMAL HIGH (ref 98–111)
Creatinine, Ser: 0.75 mg/dL (ref 0.44–1.00)
GFR, Estimated: >60 mL/min (ref >60)
Glucose, Bld: 136 mg/dL — ABNORMAL HIGH (ref 70–99)
Potassium: 4.3 mmol/L (ref 3.5–5.1)
Sodium: 147 mmol/L — ABNORMAL HIGH (ref 135–145)

## 2020-12-06 LAB — GLUCOSE, CAPILLARY
Glucose-Capillary: 107 mg/dL — ABNORMAL HIGH (ref 70–99)
Glucose-Capillary: 107 mg/dL — ABNORMAL HIGH (ref 70–99)
Glucose-Capillary: 129 mg/dL — ABNORMAL HIGH (ref 70–99)
Glucose-Capillary: 103 mg/dL — ABNORMAL HIGH (ref 70–99)
Glucose-Capillary: 120 mg/dL — ABNORMAL HIGH (ref 70–99)
Glucose-Capillary: 138 mg/dL — ABNORMAL HIGH (ref 70–99)
Glucose-Capillary: 130 mg/dL — ABNORMAL HIGH (ref 70–99)
Glucose-Capillary: 141 mg/dL — ABNORMAL HIGH (ref 70–99)

## 2020-12-06 LAB — CBC WITH DIFFERENTIAL/PLATELET
Abs Immature Granulocytes: 0.02 10*3/uL (ref 0.00–0.07)
Basophils Absolute: 0 10*3/uL (ref 0.0–0.1)
Basophils Relative: 1 %
Eosinophils Absolute: 0.5 10*3/uL (ref 0.0–0.5)
Eosinophils Relative: 6 %
HCT: 24.9 % — ABNORMAL LOW (ref 36.0–46.0)
Hemoglobin: 7.1 g/dL — ABNORMAL LOW (ref 12.0–15.0)
Immature Granulocytes: 0 %
Lymphocytes Relative: 17 %
Lymphs Abs: 1.4 10*3/uL (ref 0.7–4.0)
MCH: 24.1 pg — ABNORMAL LOW (ref 26.0–34.0)
MCHC: 28.5 g/dL — ABNORMAL LOW (ref 30.0–36.0)
MCV: 84.4 fL (ref 80.0–100.0)
Monocytes Absolute: 0.7 10*3/uL (ref 0.1–1.0)
Monocytes Relative: 8 %
Neutro Abs: 5.5 10*3/uL (ref 1.7–7.7)
Neutrophils Relative %: 68 %
Platelets: 180 10*3/uL (ref 150–400)
RBC: 2.95 MIL/uL — ABNORMAL LOW (ref 3.87–5.11)
RDW: 18.9 % — ABNORMAL HIGH (ref 11.5–15.5)
WBC: 8.1 10*3/uL (ref 4.0–10.5)
nRBC: 0 % (ref 0.0–0.2)

## 2020-12-06 LAB — PHOSPHORUS: Phosphorus: 3.8 mg/dL (ref 2.5–4.6)

## 2020-12-06 LAB — MAGNESIUM: Magnesium: 2.2 mg/dL (ref 1.7–2.4)

## 2020-12-06 MED ORDER — ACETAMINOPHEN 650 MG RE SUPP
650.0000 mg | Freq: Three times a day (TID) | RECTAL | Status: DC | PRN
  Administered 2020-12-06: 650 mg via RECTAL
  Filled 2020-12-06: qty 1

## 2020-12-06 MED ORDER — DEXMEDETOMIDINE HCL IN NACL 400 MCG/100ML IV SOLN
0.4000 ug/kg/h | INTRAVENOUS | Status: DC
  Administered 2020-12-06 – 2020-12-12 (×36): 1.2 ug/kg/h via INTRAVENOUS
  Administered 2020-12-12 (×2): 0.5 ug/kg/h via INTRAVENOUS
  Administered 2020-12-12: 0.6 ug/kg/h via INTRAVENOUS
  Administered 2020-12-13: 0.8 ug/kg/h via INTRAVENOUS
  Administered 2020-12-13 (×2): 0.5 ug/kg/h via INTRAVENOUS
  Administered 2020-12-14 (×3): 0.6 ug/kg/h via INTRAVENOUS
  Administered 2020-12-15: 0.8 ug/kg/h via INTRAVENOUS
  Administered 2020-12-15: 0.4 ug/kg/h via INTRAVENOUS
  Administered 2020-12-15 – 2020-12-16 (×3): 0.8 ug/kg/h via INTRAVENOUS
  Administered 2020-12-16: 0.9 ug/kg/h via INTRAVENOUS
  Administered 2020-12-16: 0.8 ug/kg/h via INTRAVENOUS
  Administered 2020-12-17: 0.7 ug/kg/h via INTRAVENOUS
  Administered 2020-12-17: 0.9 ug/kg/h via INTRAVENOUS
  Administered 2020-12-17: 0.8 ug/kg/h via INTRAVENOUS
  Administered 2020-12-18 (×4): 1 ug/kg/h via INTRAVENOUS
  Administered 2020-12-18: 0.8 ug/kg/h via INTRAVENOUS
  Administered 2020-12-19: 1 ug/kg/h via INTRAVENOUS
  Administered 2020-12-19 (×3): 0.8 ug/kg/h via INTRAVENOUS
  Administered 2020-12-20: 0.399 ug/kg/h via INTRAVENOUS
  Administered 2020-12-20 (×2): 0.8 ug/kg/h via INTRAVENOUS
  Administered 2020-12-20: 0.4 ug/kg/h via INTRAVENOUS
  Administered 2020-12-21: 0.6 ug/kg/h via INTRAVENOUS
  Administered 2020-12-21: 0.4 ug/kg/h via INTRAVENOUS
  Administered 2020-12-21 – 2020-12-22 (×2): 0.6 ug/kg/h via INTRAVENOUS
  Administered 2020-12-22: 0.4 ug/kg/h via INTRAVENOUS
  Administered 2020-12-22 – 2020-12-24 (×6): 0.6 ug/kg/h via INTRAVENOUS
  Filled 2020-12-06 (×38): qty 100
  Filled 2020-12-06: qty 200
  Filled 2020-12-06 (×47): qty 100

## 2020-12-06 MED ORDER — ACETAMINOPHEN 325 MG PO TABS
650.0000 mg | ORAL_TABLET | Freq: Three times a day (TID) | ORAL | Status: DC | PRN
  Administered 2020-12-10 – 2021-01-05 (×14): 650 mg
  Filled 2020-12-06 (×16): qty 2

## 2020-12-06 NOTE — Progress Notes (Signed)
[place] for Electrolyte Monitoring and Replacement   Recent Labs: Potassium (mmol/L)  Date Value  12/06/2020 4.3  02/20/2013 3.4 (L)   Magnesium (mg/dL)  Date Value  12/06/2020 2.2   Calcium (mg/dL)  Date Value  12/06/2020 8.6 (L)   Calcium, Total (mg/dL)  Date Value  02/20/2013 8.7   Albumin (g/dL)  Date Value  12/04/2020 2.9 (L)  02/20/2013 3.2 (L)   Phosphorus (mg/dL)  Date Value  12/06/2020 3.8   Sodium (mmol/L)  Date Value  12/06/2020 147 (H)  02/20/2013 137     Assessment: 55 year old female presented to the ED via EMS post witnessed cardiac arrest. ROSC achieved following 3 defibrillations and one round of epinephrine. Patient found to have significant hypokalemia on arrival. Given witnessed arrest, patient Code ICE. Pharmacy to manage electrolytes.    MIVF: NS at 50 mL/hr  Goal of Therapy:  Electrolytes WNL, K ~ 4 and Mg ~ 2 given cardiac events  Plan:  Patient with h/o low potassium despite replacement. Has required daily replacement. K up to 4.3 today. Will hold replacement today and recheck electrolytes with morning labs.  Tawnya Crook, PharmD 12/06/2020 2:07 PM

## 2020-12-06 NOTE — Progress Notes (Signed)
CRITICAL CARE NOTE  [name] with acute cardiac arrest Labs c/w severe acidosis and severe DKA Patient with witnessed V fib arrest and shocked in the field ROSC returned is unknown  1/10 admitted to ICU for cardiac arrest, started hypothermia protocol 1/11 hypothermia protocol +posturing 1/12 signs of brain damage, needs NEURO consultation 1/13 failed SAT/SBT, unable to follow commands 1/14- for SBT today, electrolytes are improved. Encephalopathic working on mental status.     CC  follow up respiratory failure  SUBJECTIVE Patient remains critically ill Prognosis is guarded Signs of brain damage   BP 126/63   Pulse 91   Temp 100.04 F (37.8 C)   Resp 17   Ht 5' 0.98" (1.549 m)   Wt 94.3 kg   SpO2 96%   BMI 39.30 kg/m    I/O last 3 completed shifts: In: 2773.3 [I.V.:1357.3; NG/GT:1416] Out: 1260 [Urine:1260] No intake/output data recorded.  SpO2: 96 % FiO2 (%): 28 %  Estimated body mass index is 39.3 kg/m as calculated from the following:   Height as of this encounter: 5' 0.98" (1.549 m).   Weight as of this encounter: 94.3 kg.  SIGNIFICANT EVENTS   REVIEW OF SYSTEMS  PATIENT IS UNABLE TO PROVIDE COMPLETE REVIEW OF SYSTEMS DUE TO SEVERE CRITICAL ILLNESS        PHYSICAL EXAMINATION:  GENERAL:critically ill appearing, +resp distress HEAD: Normocephalic, atraumatic.  EYES: Pupils equal, round, reactive to light.  No scleral icterus.  MOUTH: Moist mucosal membrane. NECK: Supple.  PULMONARY: +rhonchi, +wheezing CARDIOVASCULAR: S1 and S2. Regular rate and rhythm. No murmurs, rubs, or gallops.  GASTROINTESTINAL: Soft, nontender, -distended.  Positive bowel sounds.   MUSCULOSKELETAL: No swelling, clubbing, or edema.  NEUROLOGIC: obtunded, GCS<4T SKIN:intact,warm,dry  MEDICATIONS: I have reviewed all medications and confirmed regimen as documented   CULTURE RESULTS   Recent Results (from the past 240 hour(s))  Resp Panel by RT-PCR (Flu A&B,  Covid) Nasopharyngeal Swab     Status: None   Collection Time: 12/02/20  7:23 AM   Specimen: Nasopharyngeal Swab; Nasopharyngeal(NP) swabs in vial transport medium  Result Value Ref Range Status   SARS Coronavirus 2 by RT PCR NEGATIVE NEGATIVE Final    Comment: (NOTE) SARS-CoV-2 target nucleic acids are NOT DETECTED.  The SARS-CoV-2 RNA is generally detectable in upper respiratory specimens during the acute phase of infection. The lowest concentration of SARS-CoV-2 viral copies this assay can detect is 138 copies/mL. A negative result does not preclude SARS-Cov-2 infection and should not be used as the sole basis for treatment or other patient management decisions. A negative result may occur with  improper specimen collection/handling, submission of specimen other than nasopharyngeal swab, presence of viral mutation(s) within the areas targeted by this assay, and inadequate number of viral copies(<138 copies/mL). A negative result must be combined with clinical observations, patient history, and epidemiological information. The expected result is Negative.  Fact Sheet for Patients:  EntrepreneurPulse.com.au  Fact Sheet for Healthcare Providers:  IncredibleEmployment.be  This test is no t yet approved or cleared by the [place] FDA and  has been authorized for detection and/or diagnosis of SARS-CoV-2 by FDA under an Emergency Use Authorization (EUA). This EUA will remain  in effect (meaning this test can be used) for the duration of the COVID-19 declaration under Section 564(b)(1) of the Act, 21 U.S.C.section 360bbb-3(b)(1), unless the authorization is terminated  or revoked sooner.       Influenza A by PCR NEGATIVE NEGATIVE Final   Influenza B  by PCR NEGATIVE NEGATIVE Final    Comment: (NOTE) The Xpert Xpress SARS-CoV-2/FLU/RSV plus assay is intended as an aid in the diagnosis of influenza from Nasopharyngeal swab specimens and should  not be used as a sole basis for treatment. Nasal washings and aspirates are unacceptable for Xpert Xpress SARS-CoV-2/FLU/RSV testing.  Fact Sheet for Patients: EntrepreneurPulse.com.au  Fact Sheet for Healthcare Providers: IncredibleEmployment.be  This test is not yet approved or cleared by the [place] FDA and has been authorized for detection and/or diagnosis of SARS-CoV-2 by FDA under an Emergency Use Authorization (EUA). This EUA will remain in effect (meaning this test can be used) for the duration of the COVID-19 declaration under Section 564(b)(1) of the Act, 21 U.S.C. section 360bbb-3(b)(1), unless the authorization is terminated or revoked.  Performed at [facility], [address], [place], [place] [address]   Urine culture     Status: None   Collection Time: 12/02/20  7:23 AM   Specimen: In/Out Cath Urine  Result Value Ref Range Status   Specimen Description   Final    IN/OUT CATH URINE Performed at [facility], [address] [place], [place] [address]    Special Requests   Final    NONE Performed at [facility], [address] [place], [place] [address]    Culture   Final    NO GROWTH Performed at [facility], [address], [place], [place] [address]    Report Status 12/03/2020 FINAL  Final  Blood Culture (routine x 2)     Status: None (Preliminary result)   Collection Time: 12/02/20  8:37 AM   Specimen: BLOOD  Result Value Ref Range Status   Specimen Description BLOOD BLOOD RIGHT HAND  Final   Special Requests   Final    BOTTLES DRAWN AEROBIC AND ANAEROBIC Blood Culture adequate volume   Culture   Final    NO GROWTH 4 DAYS Performed at [facility], [address] [place], [place] [address]    Report Status PENDING  Incomplete  Blood Culture (routine x 2)     Status: None (Preliminary result)   Collection Time: 12/02/20  8:37 AM   Specimen: BLOOD  Result  Value Ref Range Status   Specimen Description BLOOD RIGHT ARM  Final   Special Requests   Final    BOTTLES DRAWN AEROBIC AND ANAEROBIC Blood Culture adequate volume   Culture   Final    NO GROWTH 4 DAYS Performed at [facility], [address], [place], [place] [address]    Report Status PENDING  Incomplete  MRSA PCR Screening     Status: None   Collection Time: 12/02/20  9:33 AM   Specimen: Nasopharyngeal  Result Value Ref Range Status   MRSA by PCR NEGATIVE NEGATIVE Final    Comment:        The GeneXpert MRSA Assay (FDA approved for NASAL specimens only), is one component of a comprehensive MRSA colonization surveillance program. It is not intended to diagnose MRSA infection nor to guide or monitor treatment for MRSA infections. Performed at [facility], [address] [place], [place] [address]           IMAGING    No results found.   Nutrition Status: Nutrition Problem: Inadequate oral intake Etiology: inability to eat Signs/Symptoms: NPO status Interventions: Tube feeding,Prostat,MVI     Indwelling Urinary Catheter continued, requirement due to   Reason to continue Indwelling Urinary Catheter strict  Intake/Output monitoring for hemodynamic instability   Central Line/ continued, requirement due to  Reason to continue [employer] of central venous pressure or other hemodynamic parameters and poor IV access   Ventilator continued, requirement due to severe respiratory failure   Ventilator Sedation RASS 0 to -2      ASSESSMENT AND PLAN SYNOPSIS  55 yo obese [name] with acute Resp hypoxic resp failure due to severe cardiac arrest due to severe acidosis from DKA ContinueHYPOTHERMIA PROTOCOL 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   Will certainly impact respiratory mechanics, ventilator weaning Suspect will need to consider additional PEEP  ACUTE KIDNEY INJURY/Renal Failure -continue Foley Catheter-assess need -Avoid nephrotoxic agents -Follow urine output, BMP -Ensure adequate renal perfusion, optimize oxygenation -Renal dose medications     NEUROLOGY Acute toxic metabolic encephalopathy Follow up [place] ICU monitoring   GI GI PROPHYLAXIS as indicated  DIET-->TF's as tolerated Constipation protocol as indicated  ENDO - will use ICU hypoglycemic\Hyperglycemia protocol if indicated     ELECTROLYTES -follow labs as needed -replace as needed -pharmacy consultation and following   DVT/GI PRX ordered and assessed TRANSFUSIONS AS NEEDED MONITOR FSBS I Assessed the need for Labs I Assessed the need for Foley I Assessed the need for Central Venous Line Family Discussion when available I Assessed the need for Mobilization I made an Assessment of medications to be adjusted accordingly Safety Risk assessment completed   CASE DISCUSSED IN MULTIDISCIPLINARY ROUNDS WITH ICU TEAM  Critical Care Time devoted to patient care services described in this note is 32 minutes.   Overall, patient is critically ill, prognosis is guarded.  Patient with Multiorgan failure and at high risk for cardiac arrest and death.    Ottie Glazier, M.D.  Pulmonary & [place]

## 2020-12-06 NOTE — Progress Notes (Signed)
Per Dr A, titrated off propofol and fentanyl and started precedex. Patient placed on spontaneous mode and has been on that for over 3 hours. Dr A, into see patient. Patient unable to open eyes spontaneously at this time or follow any simple commands. Patient does respond to pain when suctioned and has a positive cough relflex. Per Dr. Loni Muse, he will touch base with neurology. At this time no new orders.

## 2020-12-07 ENCOUNTER — Inpatient Hospital Stay: Payer: Self-pay

## 2020-12-07 LAB — CBC WITH DIFFERENTIAL/PLATELET
Abs Immature Granulocytes: 0.03 10*3/uL (ref 0.00–0.07)
Basophils Absolute: 0 10*3/uL (ref 0.0–0.1)
Basophils Relative: 0 %
Eosinophils Absolute: 0.1 10*3/uL (ref 0.0–0.5)
Eosinophils Relative: 1 %
HCT: 24.7 % — ABNORMAL LOW (ref 36.0–46.0)
Hemoglobin: 7.5 g/dL — ABNORMAL LOW (ref 12.0–15.0)
Immature Granulocytes: 0 %
Lymphocytes Relative: 14 %
Lymphs Abs: 1.1 10*3/uL (ref 0.7–4.0)
MCH: 24.4 pg — ABNORMAL LOW (ref 26.0–34.0)
MCHC: 30.4 g/dL (ref 30.0–36.0)
MCV: 80.5 fL (ref 80.0–100.0)
Monocytes Absolute: 0.6 10*3/uL (ref 0.1–1.0)
Monocytes Relative: 9 %
Neutro Abs: 5.6 10*3/uL (ref 1.7–7.7)
Neutrophils Relative %: 76 %
Platelets: 188 10*3/uL (ref 150–400)
RBC: 3.07 MIL/uL — ABNORMAL LOW (ref 3.87–5.11)
RDW: 18.7 % — ABNORMAL HIGH (ref 11.5–15.5)
WBC: 7.4 10*3/uL (ref 4.0–10.5)
nRBC: 0 % (ref 0.0–0.2)

## 2020-12-07 LAB — CULTURE, BLOOD (ROUTINE X 2)
Culture: NO GROWTH
Culture: NO GROWTH
Special Requests: ADEQUATE
Special Requests: ADEQUATE

## 2020-12-07 LAB — BASIC METABOLIC PANEL
Anion gap: 8 (ref 5–15)
BUN: 27 mg/dL — ABNORMAL HIGH (ref 6–20)
CO2: 26 mmol/L (ref 22–32)
Calcium: 8.4 mg/dL — ABNORMAL LOW (ref 8.9–10.3)
Chloride: 113 mmol/L — ABNORMAL HIGH (ref 98–111)
Creatinine, Ser: 0.72 mg/dL (ref 0.44–1.00)
GFR, Estimated: >60 mL/min (ref >60)
Glucose, Bld: 162 mg/dL — ABNORMAL HIGH (ref 70–99)
Potassium: 3.7 mmol/L (ref 3.5–5.1)
Sodium: 147 mmol/L — ABNORMAL HIGH (ref 135–145)

## 2020-12-07 LAB — GLUCOSE, CAPILLARY
Glucose-Capillary: 133 mg/dL — ABNORMAL HIGH (ref 70–99)
Glucose-Capillary: 141 mg/dL — ABNORMAL HIGH (ref 70–99)
Glucose-Capillary: 142 mg/dL — ABNORMAL HIGH (ref 70–99)
Glucose-Capillary: 143 mg/dL — ABNORMAL HIGH (ref 70–99)
Glucose-Capillary: 147 mg/dL — ABNORMAL HIGH (ref 70–99)
Glucose-Capillary: 164 mg/dL — ABNORMAL HIGH (ref 70–99)

## 2020-12-07 LAB — PHOSPHORUS: Phosphorus: 3.6 mg/dL (ref 2.5–4.6)

## 2020-12-07 LAB — MAGNESIUM: Magnesium: 2.1 mg/dL (ref 1.7–2.4)

## 2020-12-07 IMAGING — DX DG CHEST 1V PORT
1 series · 1 of 1 positions shown · non-contrast
Comparison: [DATE]

CLINICAL DATA: Endotracheal tube placement

EXAM:
PORTABLE CHEST 1 VIEW

[chest ap]
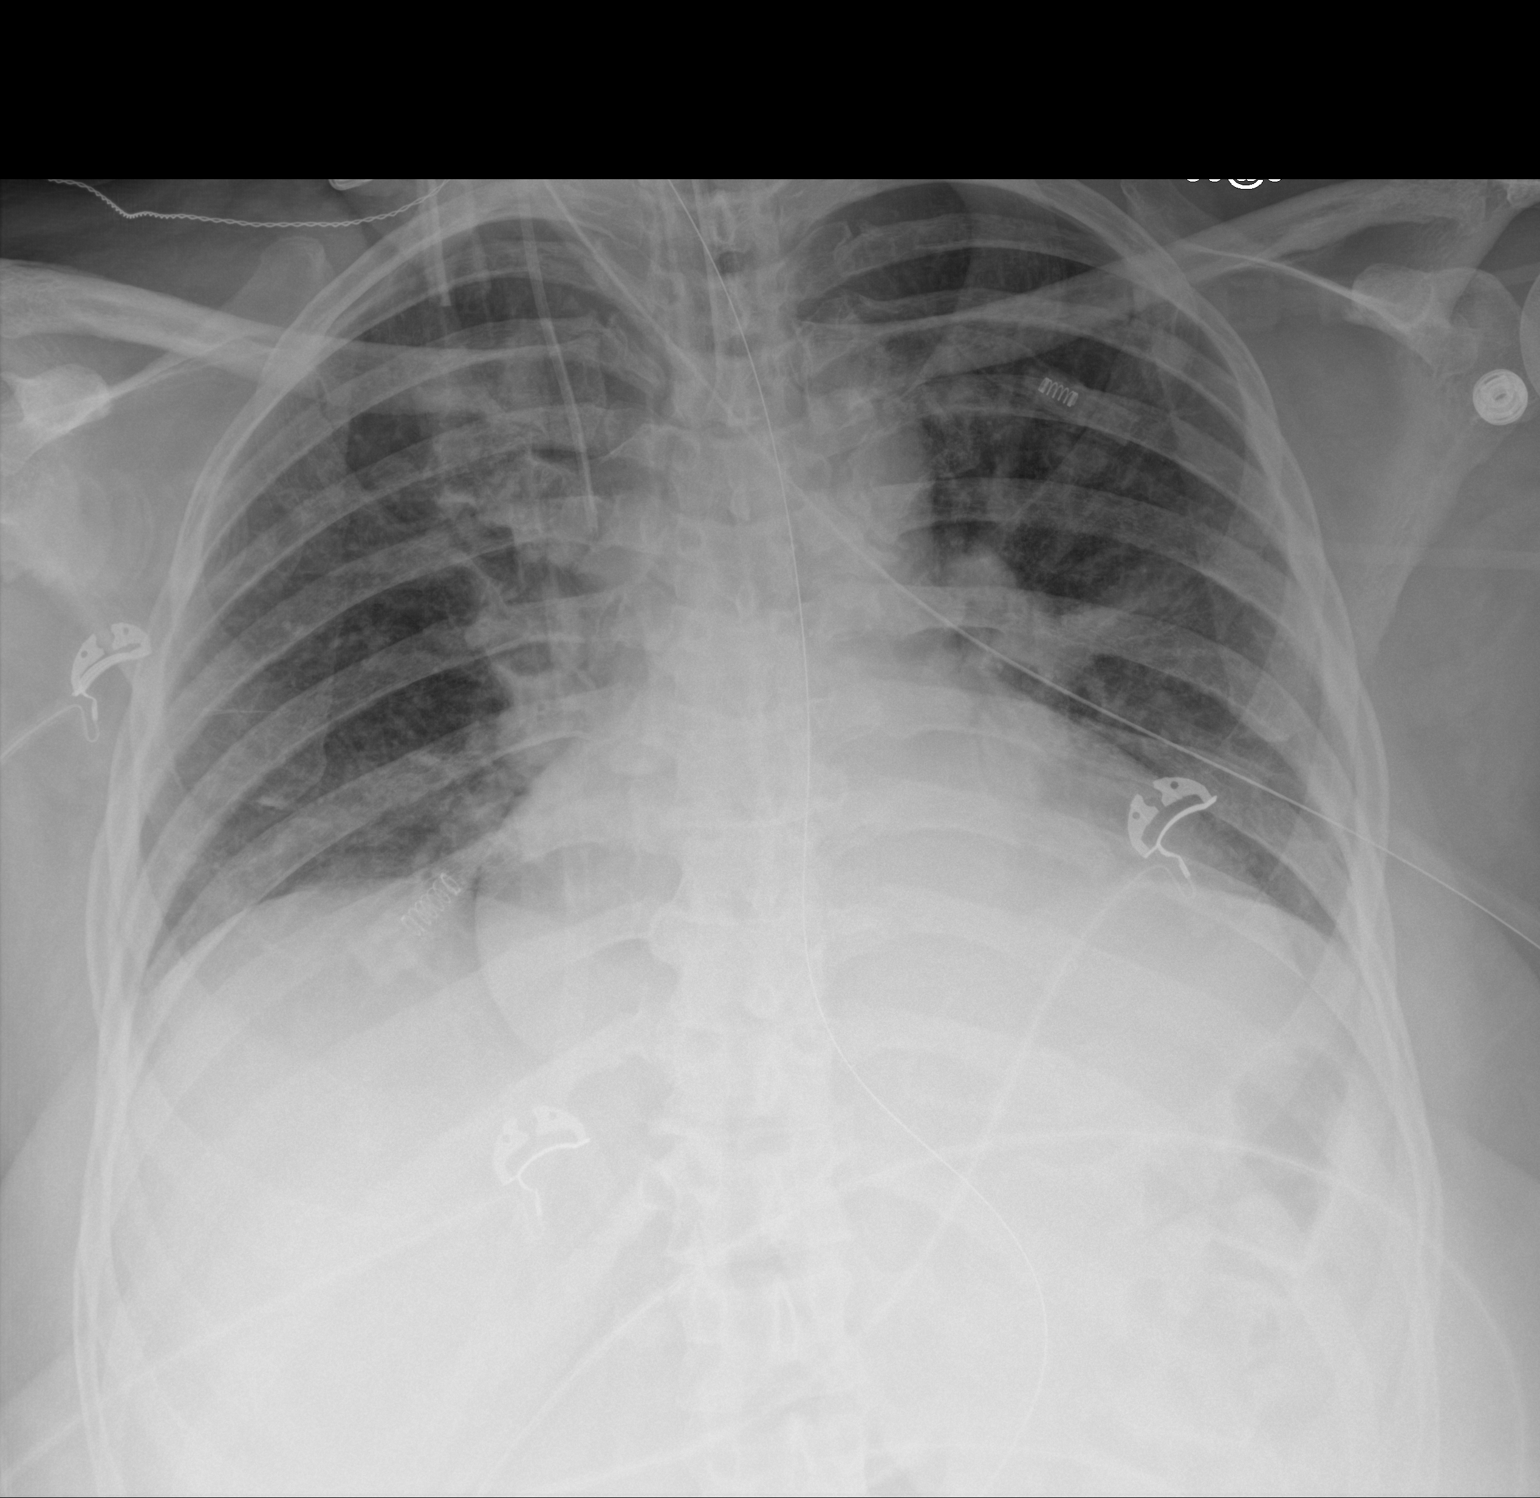

[1 of 1 positions shown; findings below may reference images not displayed]

FINDINGS: The endotracheal tube, right central venous catheter, and enteric
tube all appear to be grossly well position. The lung volumes are
low. The heart size is stable but enlarged. There is atelectasis at
the lung bases with probable small bilateral pleural effusions.
There is a dense retrocardiac opacity favored to represent
atelectasis. There is no pneumothorax. There is no definite acute
osseous abnormality.
IMPRESSION: 1. Lines and tubes as above.
2. Low lung volumes with bibasilar atelectasis and probable small
bilateral pleural effusions.

## 2020-12-07 IMAGING — MR MR HEAD WO/W CM
13 series · 43 of 48 positions shown · IV contrast (gadavist)
Comparison: Brain MRI [DATE]

CLINICAL DATA: Cardiac arrest

EXAM:
MRI HEAD WITHOUT AND WITH CONTRAST
TECHNIQUE: Multiplanar, multiecho pulse sequences of the brain and surrounding
structures were obtained without and with intravenous contrast.
CONTRAST:  7.5mL GADAVIST GADOBUTROL 1 MMOL/ML IV SOLN

[Series 5: ax dwi_tracew · axial · 3.0mm · 0.60mm/px · z∈[-75,+72]mm · 4 of 48 slices shown]
[im 1/48]
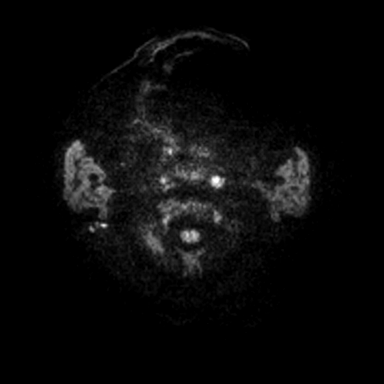
[im 16/48]
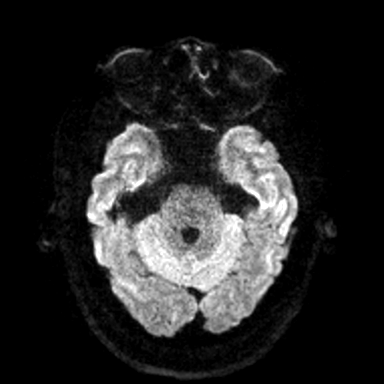
[im 32/48]
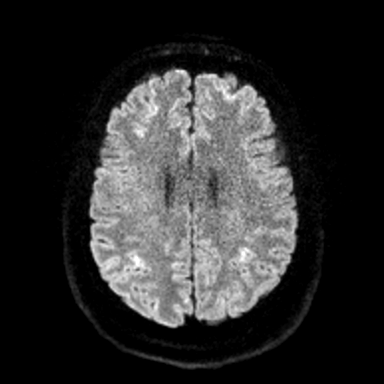
[im 48/48]
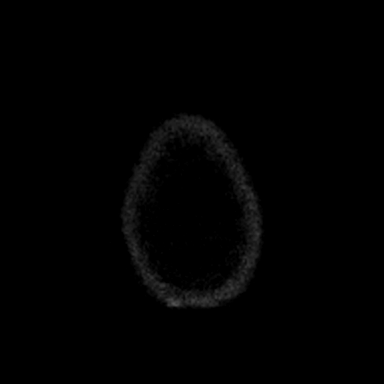

[Series 6: ax dwi_adc · axial · 3.0mm · 0.60mm/px · z∈[-75,+72]mm · 4 of 48 slices shown]
[im 1/48]
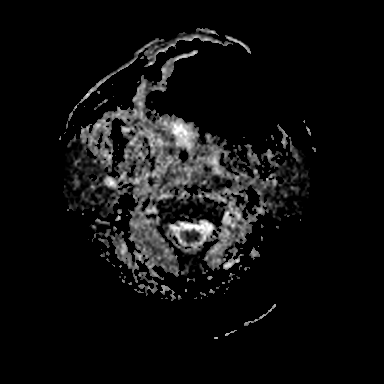
[im 16/48]
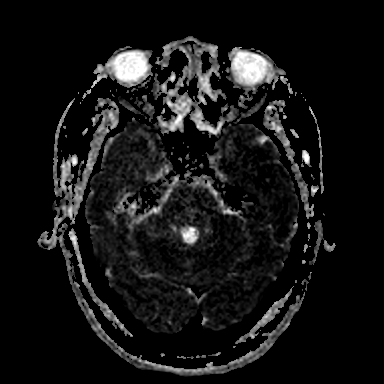
[im 32/48]
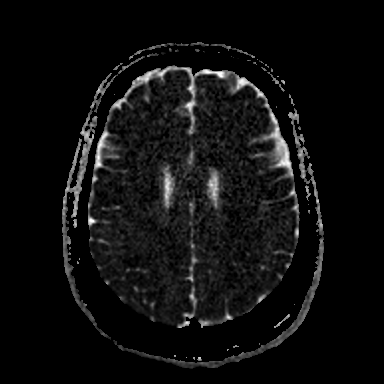
[im 48/48]
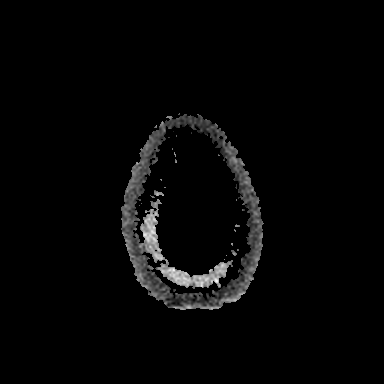

[Series 7: cor dwi_tracew · coronal · 5.0mm · 0.60mm/px · 2 of 38 slices shown]
[im 1/38]
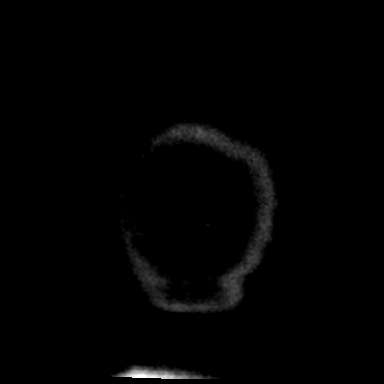
[im 38/38]
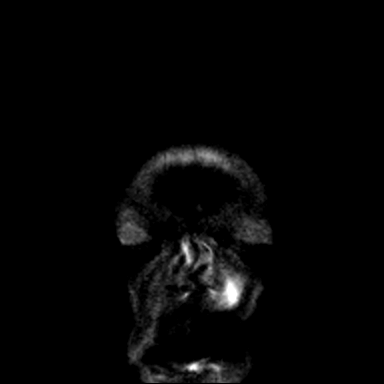

[Series 8: cor dwi_adc · coronal · 5.0mm · 0.60mm/px · 2 of 37 slices shown]
[im 1/37]
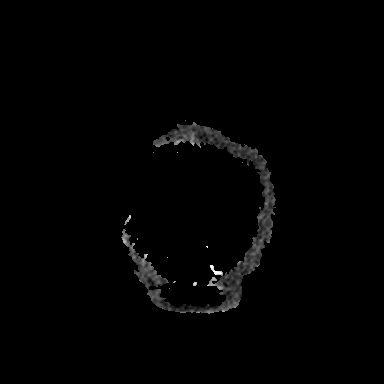
[im 37/37]
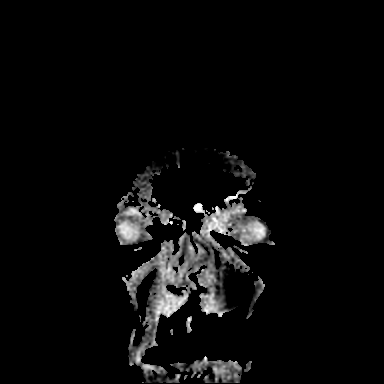

[Series 9: T1 · sagittal · 5.0mm · 0.62mm/px · 1 of 23 slices shown (1 of 2)]
[im 1/23]
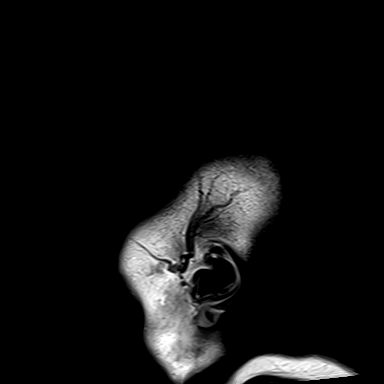

[Series 10: T2 · axial · 5.0mm · 0.45mm/px · z∈[-73,+74]mm · 2 of 27 slices shown (1 of 2)]
[im 1/27]
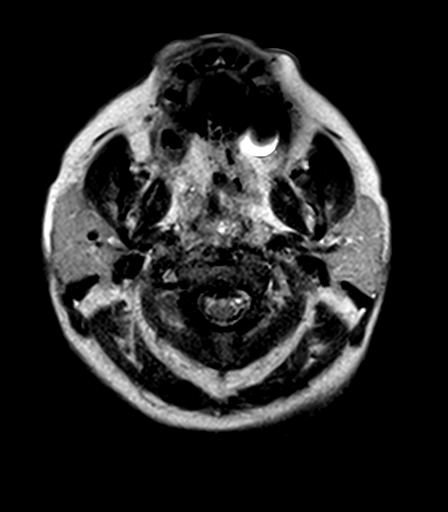
[im 27/27]
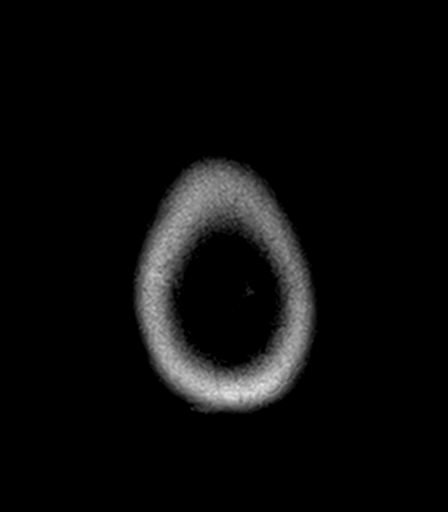

[Series 12: pha_images · axial · 3.0mm · 0.90mm/px · z∈[-72,+72]mm · 3 of 52 slices shown]
[im 1/52]
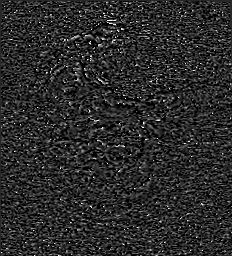
[im 26/52]
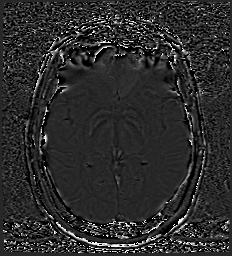
[im 52/52]
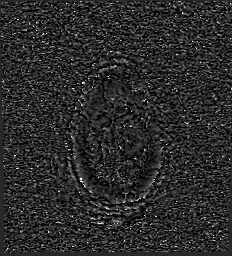

[Series 13: swi_images · axial · 3.0mm · 0.90mm/px · z∈[-72,-1]mm · 2 of 52 slices shown]
[im 1/52]
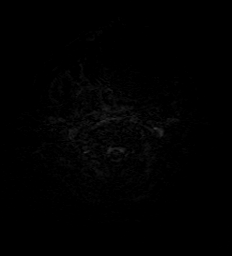
[im 26/52]
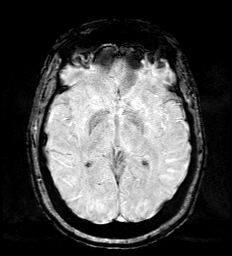

[Series 15: FLAIR · axial · 3.0mm · 0.53mm/px · z∈[-79,+74]mm · 3 of 55 slices shown]
[im 1/55]
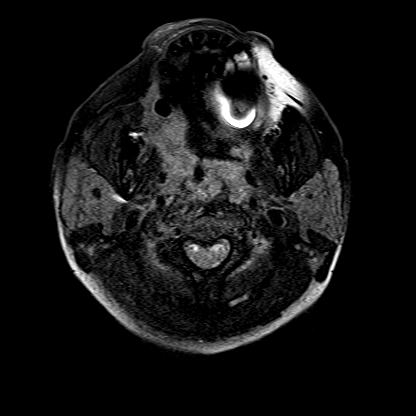
[im 28/55]
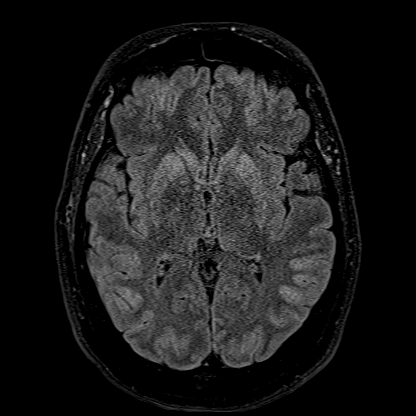
[im 55/55]
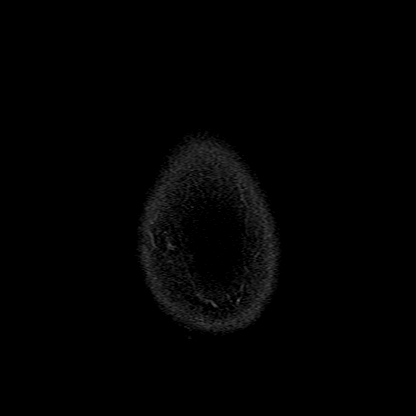

[Series 16: T1 · axial · 1.0mm · 0.98mm/px · z∈[-73,+77]mm · 8 of 160 slices shown (2 of 2)]
[im 1/160]
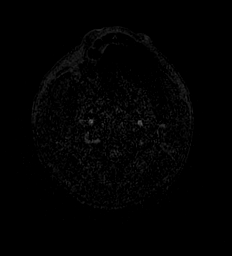
[im 18/160]
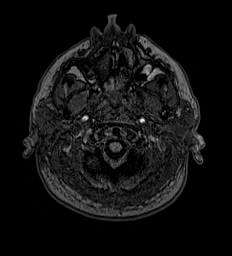
[im 54/160]
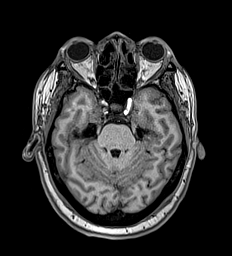
[im 71/160]
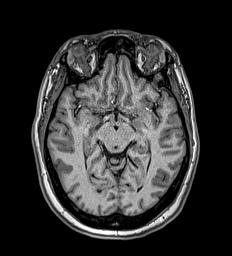
[im 89/160]
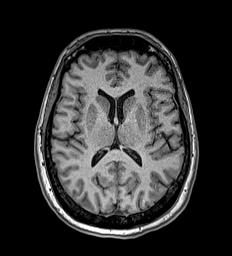
[im 107/160]
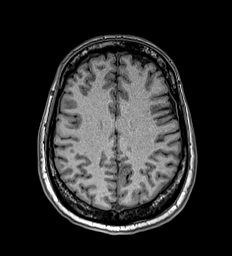
[im 142/160]
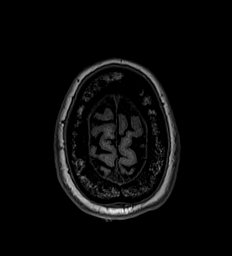
[im 160/160]
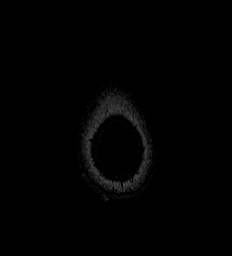

[Series 17: T2 · coronal · 5.0mm · 0.45mm/px · 2 of 31 slices shown (2 of 2)]
[im 1/31]
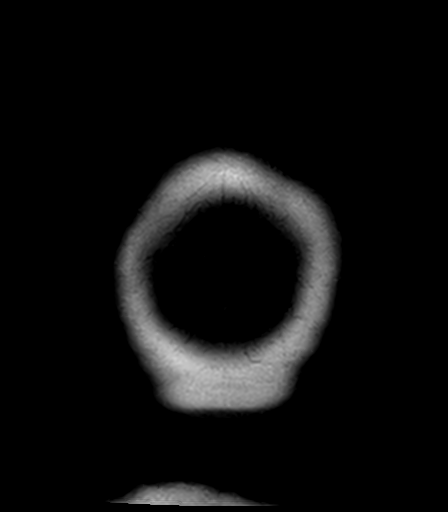
[im 31/31]
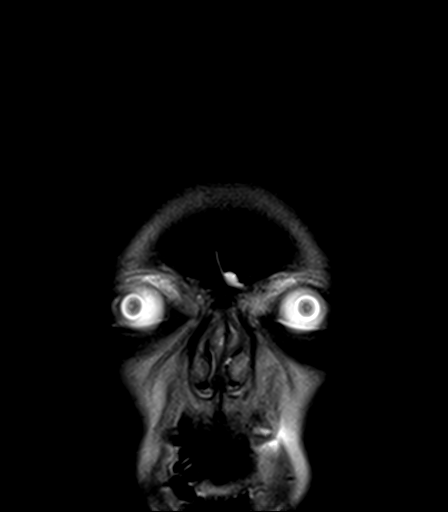

[Series 18: T1 post-contrast · axial · 1.0mm · 0.98mm/px · z∈[-73,+77]mm · 8 of 160 slices shown (1 of 2)]
[im 1/160]
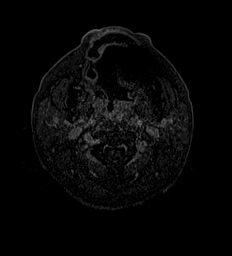
[im 18/160]
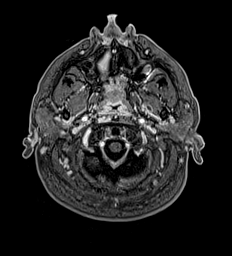
[im 54/160]
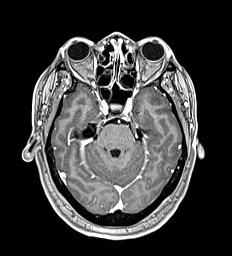
[im 71/160]
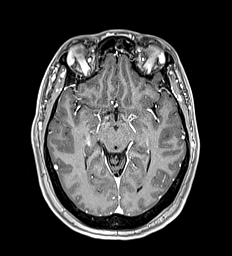
[im 89/160]
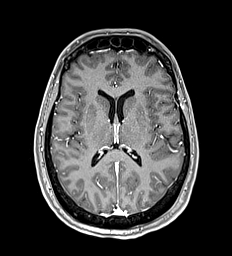
[im 107/160]
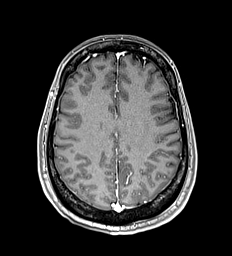
[im 142/160]
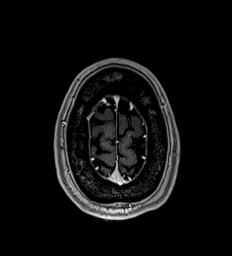
[im 160/160]
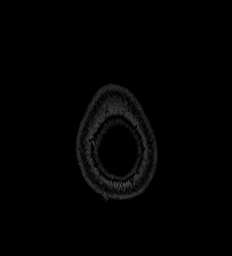

[Series 19: T1 post-contrast · coronal · 5.0mm · 0.57mm/px · 2 of 29 slices shown (2 of 2)]
[im 1/29]
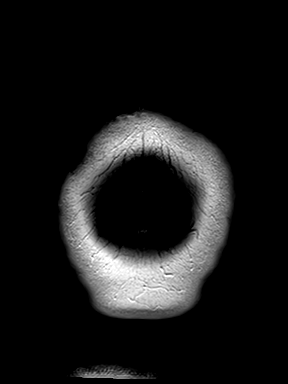
[im 29/29]
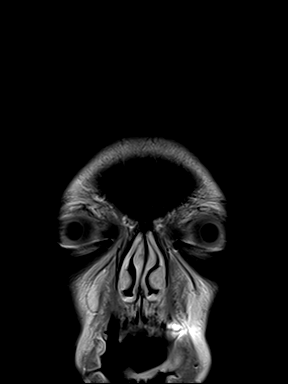

[43 of 48 positions shown; findings below may reference images not displayed]

FINDINGS: Brain: There is multifocal cortical diffusion abnormality, greatest
in the parietal and temporal lobes. This has progressed from the
prior study. There is also progressive diffusion abnormality within
the basal ganglia. No acute or chronic hemorrhage. Normal white
matter signal, parenchymal volume and CSF spaces. The midline
structures are normal. There is no abnormal contrast enhancement.

Vascular: Major flow voids are preserved.

Skull and upper cervical spine: Normal calvarium and skull base.
Visualized upper cervical spine and soft tissues are normal.

Sinuses/Orbits:No paranasal sinus fluid levels or advanced mucosal
thickening. No mastoid or middle ear effusion. Normal orbits.
IMPRESSION: 1. Progressive DWI and FLAIR abnormality within the parietotemporal
cortices and the bilateral corpus striatum, consistent with anoxic
brain injury.
2. No hemorrhage or mass effect.

## 2020-12-07 MED ORDER — GADOBUTROL 1 MMOL/ML IV SOLN
7.5000 mL | Freq: Once | INTRAVENOUS | Status: AC | PRN
  Administered 2020-12-07: 7.5 mL via INTRAVENOUS

## 2020-12-07 MED ORDER — POTASSIUM CHLORIDE 20 MEQ PO PACK
20.0000 meq | PACK | Freq: Once | ORAL | Status: AC
  Administered 2020-12-07: 20 meq
  Filled 2020-12-07: qty 1

## 2020-12-07 NOTE — Progress Notes (Signed)
[facility] for Electrolyte Monitoring and Replacement   Recent Labs: Potassium (mmol/L)  Date Value  12/07/2020 3.7  02/20/2013 3.4 (L)   Magnesium (mg/dL)  Date Value  12/07/2020 2.1   Calcium (mg/dL)  Date Value  12/07/2020 8.4 (L)   Calcium, Total (mg/dL)  Date Value  02/20/2013 8.7   Albumin (g/dL)  Date Value  12/04/2020 2.9 (L)  02/20/2013 3.2 (L)   Phosphorus (mg/dL)  Date Value  12/07/2020 3.6   Sodium (mmol/L)  Date Value  12/07/2020 147 (H)  02/20/2013 137     Assessment: 55 year old female presented to the ED via EMS post witnessed cardiac arrest. ROSC achieved following 3 defibrillations and one round of epinephrine. Patient found to have significant hypokalemia on arrival. Given witnessed arrest, patient Code ICE. Pharmacy to manage electrolytes.    MIVF: NS at 50 mL/hr  Goal of Therapy:  Electrolytes WNL, K ~ 4 and Mg ~ 2 given cardiac events  Plan:  K 3.7 Mag 2.1  Phos 3.6  Scr 0.72 Will order KCL 20 meq PO x 1.  K went from 4.3 to 3.7 today recheck electrolytes with morning labs.  Rhet Rorke A, PharmD 12/07/2020 2:06 PM

## 2020-12-08 LAB — CBC WITH DIFFERENTIAL/PLATELET
Abs Immature Granulocytes: 0.07 10*3/uL (ref 0.00–0.07)
Basophils Absolute: 0 10*3/uL (ref 0.0–0.1)
Basophils Relative: 0 %
Eosinophils Absolute: 0.2 10*3/uL (ref 0.0–0.5)
Eosinophils Relative: 2 %
HCT: 26.3 % — ABNORMAL LOW (ref 36.0–46.0)
Hemoglobin: 7.7 g/dL — ABNORMAL LOW (ref 12.0–15.0)
Immature Granulocytes: 1 %
Lymphocytes Relative: 21 %
Lymphs Abs: 2.1 10*3/uL (ref 0.7–4.0)
MCH: 24 pg — ABNORMAL LOW (ref 26.0–34.0)
MCHC: 29.3 g/dL — ABNORMAL LOW (ref 30.0–36.0)
MCV: 81.9 fL (ref 80.0–100.0)
Monocytes Absolute: 0.7 10*3/uL (ref 0.1–1.0)
Monocytes Relative: 7 %
Neutro Abs: 6.9 10*3/uL (ref 1.7–7.7)
Neutrophils Relative %: 69 %
Platelets: 184 10*3/uL (ref 150–400)
RBC: 3.21 MIL/uL — ABNORMAL LOW (ref 3.87–5.11)
RDW: 18.9 % — ABNORMAL HIGH (ref 11.5–15.5)
WBC: 10 10*3/uL (ref 4.0–10.5)
nRBC: 0.2 % (ref 0.0–0.2)

## 2020-12-08 LAB — BASIC METABOLIC PANEL
Anion gap: 10 (ref 5–15)
BUN: 33 mg/dL — ABNORMAL HIGH (ref 6–20)
CO2: 24 mmol/L (ref 22–32)
Calcium: 8.4 mg/dL — ABNORMAL LOW (ref 8.9–10.3)
Chloride: 113 mmol/L — ABNORMAL HIGH (ref 98–111)
Creatinine, Ser: 0.8 mg/dL (ref 0.44–1.00)
GFR, Estimated: >60 mL/min (ref >60)
Glucose, Bld: 197 mg/dL — ABNORMAL HIGH (ref 70–99)
Potassium: 3.4 mmol/L — ABNORMAL LOW (ref 3.5–5.1)
Sodium: 147 mmol/L — ABNORMAL HIGH (ref 135–145)

## 2020-12-08 LAB — GLUCOSE, CAPILLARY
Glucose-Capillary: 141 mg/dL — ABNORMAL HIGH (ref 70–99)
Glucose-Capillary: 147 mg/dL — ABNORMAL HIGH (ref 70–99)
Glucose-Capillary: 133 mg/dL — ABNORMAL HIGH (ref 70–99)
Glucose-Capillary: 146 mg/dL — ABNORMAL HIGH (ref 70–99)
Glucose-Capillary: 149 mg/dL — ABNORMAL HIGH (ref 70–99)

## 2020-12-08 LAB — MAGNESIUM: Magnesium: 2.1 mg/dL (ref 1.7–2.4)

## 2020-12-08 LAB — PHOSPHORUS: Phosphorus: 3.7 mg/dL (ref 2.5–4.6)

## 2020-12-08 MED ORDER — LACTATED RINGERS IV BOLUS
1000.0000 mL | Freq: Once | INTRAVENOUS | Status: AC
  Administered 2020-12-08: 1000 mL via INTRAVENOUS

## 2020-12-08 MED ORDER — MIDAZOLAM HCL 2 MG/2ML IJ SOLN
1.0000 mg | INTRAMUSCULAR | Status: DC | PRN
  Administered 2020-12-09: 1 mg via INTRAVENOUS
  Filled 2020-12-08: qty 2

## 2020-12-08 MED ORDER — POTASSIUM CHLORIDE 20 MEQ PO PACK
40.0000 meq | PACK | Freq: Once | ORAL | Status: AC
  Administered 2020-12-08: 40 meq
  Filled 2020-12-08: qty 2

## 2020-12-08 MED ORDER — POLYETHYLENE GLYCOL 3350 17 G PO PACK
17.0000 g | PACK | Freq: Every day | ORAL | Status: DC
  Administered 2020-12-08 – 2020-12-18 (×7): 17 g via ORAL
  Filled 2020-12-08 (×8): qty 1

## 2020-12-08 MED ORDER — SENNOSIDES-DOCUSATE SODIUM 8.6-50 MG PO TABS
1.0000 | ORAL_TABLET | Freq: Every day | ORAL | Status: DC
  Administered 2020-12-08 – 2020-12-18 (×7): 1 via ORAL
  Filled 2020-12-08 (×8): qty 1

## 2020-12-08 NOTE — Progress Notes (Signed)
CRITICAL CARE NOTE  [name] with acute cardiac arrest Labs c/w severe acidosis and severe DKA Patient with witnessed V fib arrest and shocked in the field ROSC returned is unknown  1/10 admitted to ICU for cardiac arrest, started hypothermia protocol 1/11 hypothermia protocol +posturing 1/12 signs of brain damage, needs NEURO consultation 1/13 failed SAT/SBT, unable to follow commands 1/14- for SBT today, electrolytes are improved. Encephalopathic working on mental status.  12/07/20- Patient remains crtically ill and is with interval worsening MRI brain, I met with husband to briefly review.  Discussed case with Neurologist today and there is plan for meeting with family.    CC  follow up respiratory failure  SUBJECTIVE Patient remains critically ill Prognosis is guarded Signs of brain damage on repeat MRI 12/07/20   BP 121/69   Pulse (!) 57   Temp 99.68 F (37.6 C)   Resp 15   Ht 5' 0.98" (1.549 m)   Wt 94.3 kg   SpO2 99%   BMI 39.30 kg/m    I/O last 3 completed shifts: In: 4839.7 [I.V.:2879.7; NG/GT:1960] Out: 6384 [Urine:3570] Total I/O In: 271.3 [I.V.:191.3; NG/GT:80] Out: 100 [Urine:100]  SpO2: 99 % FiO2 (%): 28 %  Estimated body mass index is 39.3 kg/m as calculated from the following:   Height as of this encounter: 5' 0.98" (1.549 m).   Weight as of this encounter: 94.3 kg.  SIGNIFICANT EVENTS   REVIEW OF SYSTEMS  PATIENT IS UNABLE TO PROVIDE COMPLETE REVIEW OF SYSTEMS DUE TO SEVERE CRITICAL ILLNESS        PHYSICAL EXAMINATION:  GENERAL:critically ill appearing, +resp distress HEAD: Normocephalic, atraumatic.  EYES: Pupils equal, round, reactive to light.  No scleral icterus.  MOUTH: Moist mucosal membrane. NECK: Supple.  PULMONARY: +rhonchi, +wheezing CARDIOVASCULAR: S1 and S2. Regular rate and rhythm. No murmurs, rubs, or gallops.  GASTROINTESTINAL: Soft, nontender, -distended.  Positive bowel sounds.   MUSCULOSKELETAL: No swelling,  clubbing, or edema.  NEUROLOGIC: obtunded, GCS<4T SKIN:intact,warm,dry  MEDICATIONS: I have reviewed all medications and confirmed regimen as documented   CULTURE RESULTS   Recent Results (from the past 240 hour(s))  Resp Panel by RT-PCR (Flu A&B, Covid) Nasopharyngeal Swab     Status: None   Collection Time: 12/02/20  7:23 AM   Specimen: Nasopharyngeal Swab; Nasopharyngeal(NP) swabs in vial transport medium  Result Value Ref Range Status   SARS Coronavirus 2 by RT PCR NEGATIVE NEGATIVE Final    Comment: (NOTE) SARS-CoV-2 target nucleic acids are NOT DETECTED.  The SARS-CoV-2 RNA is generally detectable in upper respiratory specimens during the acute phase of infection. The lowest concentration of SARS-CoV-2 viral copies this assay can detect is 138 copies/mL. A negative result does not preclude SARS-Cov-2 infection and should not be used as the sole basis for treatment or other patient management decisions. A negative result may occur with  improper specimen collection/handling, submission of specimen other than nasopharyngeal swab, presence of viral mutation(s) within the areas targeted by this assay, and inadequate number of viral copies(<138 copies/mL). A negative result must be combined with clinical observations, patient history, and epidemiological information. The expected result is Negative.  Fact Sheet for Patients:  EntrepreneurPulse.com.au  Fact Sheet for Healthcare Providers:  IncredibleEmployment.be  This test is no t yet approved or cleared by the [place] FDA and  has been authorized for detection and/or diagnosis of SARS-CoV-2 by FDA under an Emergency Use Authorization (EUA). This EUA will remain  in effect (meaning this test can be  used) for the duration of the COVID-19 declaration under Section 564(b)(1) of the Act, 21 U.S.C.section 360bbb-3(b)(1), unless the authorization is terminated  or revoked sooner.        Influenza A by PCR NEGATIVE NEGATIVE Final   Influenza B by PCR NEGATIVE NEGATIVE Final    Comment: (NOTE) The Xpert Xpress SARS-CoV-2/FLU/RSV plus assay is intended as an aid in the diagnosis of influenza from Nasopharyngeal swab specimens and should not be used as a sole basis for treatment. Nasal washings and aspirates are unacceptable for Xpert Xpress SARS-CoV-2/FLU/RSV testing.  Fact Sheet for Patients: EntrepreneurPulse.com.au  Fact Sheet for Healthcare Providers: IncredibleEmployment.be  This test is not yet approved or cleared by the [place] FDA and has been authorized for detection and/or diagnosis of SARS-CoV-2 by FDA under an Emergency Use Authorization (EUA). This EUA will remain in effect (meaning this test can be used) for the duration of the COVID-19 declaration under Section 564(b)(1) of the Act, 21 U.S.C. section 360bbb-3(b)(1), unless the authorization is terminated or revoked.  Performed at [facility], [address], [place], [place] [address]   Urine culture     Status: None   Collection Time: 12/02/20  7:23 AM   Specimen: In/Out Cath Urine  Result Value Ref Range Status   Specimen Description   Final    IN/OUT CATH URINE Performed at [facility], [address] [place], [place] [address]    Special Requests   Final    NONE Performed at [facility], [address], [place], [place] [address]    Culture   Final    NO GROWTH Performed at [facility], [place] [address] [place], [place] [address]    Report Status 12/03/2020 FINAL  Final  Blood Culture (routine x 2)     Status: None   Collection Time: 12/02/20  8:37 AM   Specimen: BLOOD  Result Value Ref Range Status   Specimen Description BLOOD BLOOD RIGHT HAND  Final   Special Requests   Final    BOTTLES DRAWN AEROBIC AND ANAEROBIC Blood Culture adequate volume   Culture   Final    NO GROWTH 5 DAYS Performed at  [facility], [address], [place], [place] [address]    Report Status 12/07/2020 FINAL  Final  Blood Culture (routine x 2)     Status: None   Collection Time: 12/02/20  8:37 AM   Specimen: BLOOD  Result Value Ref Range Status   Specimen Description BLOOD RIGHT ARM  Final   Special Requests   Final    BOTTLES DRAWN AEROBIC AND ANAEROBIC Blood Culture adequate volume   Culture   Final    NO GROWTH 5 DAYS Performed at [facility], [address] [place], [place] [address]    Report Status 12/07/2020 FINAL  Final  MRSA PCR Screening     Status: None   Collection Time: 12/02/20  9:33 AM   Specimen: Nasopharyngeal  Result Value Ref Range Status   MRSA by PCR NEGATIVE NEGATIVE Final    Comment:        The GeneXpert MRSA Assay (FDA approved for NASAL specimens only), is one component of a comprehensive MRSA colonization surveillance program. It is not intended to diagnose MRSA infection nor to guide or monitor treatment for MRSA infections. Performed at [facility], [address] [place], [place] [address]           IMAGING    MR  BRAIN W WO CONTRAST  Result Date: 12/07/2020 CLINICAL DATA:  Cardiac arrest EXAM: MRI HEAD WITHOUT AND WITH CONTRAST TECHNIQUE: Multiplanar, multiecho pulse sequences of the brain and surrounding structures were obtained without and with intravenous contrast. CONTRAST:  7.61m GADAVIST GADOBUTROL 1 MMOL/ML IV SOLN COMPARISON:  Brain MRI 12/04/2020 FINDINGS: Brain: There is multifocal cortical diffusion abnormality, greatest in the parietal and temporal lobes. This has progressed from the prior study. There is also progressive diffusion abnormality within the basal ganglia. No acute or chronic hemorrhage. Normal white matter signal, parenchymal volume and CSF spaces. The midline structures are normal. There is no abnormal contrast enhancement. Vascular: Major flow voids are preserved. Skull and upper cervical spine:  Normal calvarium and skull base. Visualized upper cervical spine and soft tissues are normal. Sinuses/Orbits:No paranasal sinus fluid levels or advanced mucosal thickening. No mastoid or middle ear effusion. Normal orbits. IMPRESSION: 1. Progressive DWI and FLAIR abnormality within the parietotemporal cortices and the bilateral corpus striatum, consistent with anoxic brain injury. 2. No hemorrhage or mass effect. Electronically Signed   By: KUlyses JarredM.D.   On: 12/07/2020 21:56     Nutrition Status: Nutrition Problem: Inadequate oral intake Etiology: inability to eat Signs/Symptoms: NPO status Interventions: Tube feeding,Prostat,MVI     Indwelling Urinary Catheter continued, requirement due to   Reason to continue Indwelling Urinary Catheter strict Intake/Output monitoring for hemodynamic instability   Central Line/ continued, requirement due to  Reason to continue CSumnerof central venous pressure or other hemodynamic parameters and poor IV access   Ventilator continued, requirement due to severe respiratory failure   Ventilator Sedation RASS 0 to -2      ASSESSMENT AND PLAN SYNOPSIS  55yo obese [name] with acute Resp hypoxic resp failure due to severe cardiac arrest due to severe acidosis from DKA ContinueHYPOTHERMIA PROTOCOL Unfortunately showing signs of anoxic brain damage per neurologist   Severe ACUTE Hypoxic and Hypercapnic Respiratory Failure -continue Full MV support -continue Bronchodilator Therapy -Wean Fio2 and PEEP as tolerated -VAP/VENT bundle implementation   Morbid obesity, possible OSA.   Will certainly impact respiratory mechanics, ventilator weaning Suspect will need to consider additional PEEP  ACUTE KIDNEY INJURY/Renal Failure -continue Foley Catheter-assess need -Avoid nephrotoxic agents -Follow urine output, BMP -Ensure adequate renal perfusion, optimize oxygenation -Renal dose medications     NEUROLOGY Acute toxic  metabolic encephalopathy [place]   GI GI PROPHYLAXIS as indicated  DIET-->TF's as tolerated Constipation protocol as indicated  ENDO - will use ICU hypoglycemic\Hyperglycemia protocol if indicated     ELECTROLYTES -follow labs as needed -replace as needed -pharmacy consultation and following   DVT/GI PRX ordered and assessed TRANSFUSIONS AS NEEDED MONITOR FSBS I Assessed the need for Labs I Assessed the need for Foley I Assessed the need for Central Venous Line Family Discussion when available I Assessed the need for Mobilization I made an Assessment of medications to be adjusted accordingly Safety Risk assessment completed   CASE DISCUSSED IN MULTIDISCIPLINARY ROUNDS WITH ICU TEAM  Critical Care Time devoted to patient care services described in this note is 32 minutes.   Overall, patient is critically ill, prognosis is guarded.  Patient with Multiorgan failure and at high risk for cardiac arrest and death.    FOttie Glazier M.D.  [facility] [place]

## 2020-12-08 NOTE — Plan of Care (Addendum)
Patient remains intubated and sedated. MRI suggesting anoxic brain injury. Right gaze preference; possibly extinction to the left. Unstable HR this AM (30s-140s) and BP (00B-704U systolic). Neurology eval today. Spouse at bedside. No acute changes.   Problem: Activity: Goal: Ability to tolerate increased activity will improve Outcome: Not Progressing   Problem: Respiratory: Goal: Ability to maintain a clear airway and adequate ventilation will improve Outcome: Not Progressing   Problem: Role Relationship: Goal: Method of communication will improve Outcome: Not Progressing   Problem: Health Behavior/Discharge Planning: Goal: Ability to manage health-related needs will improve Outcome: Not Progressing   Problem: Clinical Measurements: Goal: Ability to maintain clinical measurements within normal limits will improve Outcome: Not Progressing Goal: Will remain free from infection Outcome: Not Progressing Goal: Diagnostic test results will improve Outcome: Not Progressing Goal: Respiratory complications will improve Outcome: Not Progressing Goal: Cardiovascular complication will be avoided Outcome: Not Progressing   Problem: Nutrition: Goal: Adequate nutrition will be maintained Outcome: Not Progressing

## 2020-12-08 NOTE — Progress Notes (Signed)
[facility] for Electrolyte Monitoring and Replacement   Recent Labs: Potassium (mmol/L)  Date Value  12/08/2020 3.4 (L)  02/20/2013 3.4 (L)   Magnesium (mg/dL)  Date Value  12/08/2020 2.1   Calcium (mg/dL)  Date Value  12/08/2020 8.4 (L)   Calcium, Total (mg/dL)  Date Value  02/20/2013 8.7   Albumin (g/dL)  Date Value  12/04/2020 2.9 (L)  02/20/2013 3.2 (L)   Phosphorus (mg/dL)  Date Value  12/08/2020 3.7   Sodium (mmol/L)  Date Value  12/08/2020 147 (H)  02/20/2013 137     Assessment: 55 year old female presented to the ED via EMS post witnessed cardiac arrest. ROSC achieved following 3 defibrillations and one round of epinephrine. Patient found to have significant hypokalemia on arrival. Given witnessed arrest, patient Code ICE. Pharmacy to manage electrolytes.    MIVF: NS at 50 mL/hr  Goal of Therapy:  Electrolytes WNL, K ~ 4 and Mg ~ 2 given cardiac events  Plan:  Na 147  K 3.4  Mag 2.1  Phos 3.7  Scr 0.80 NP ordered KCL 40 meq packet per tube x 1.   recheck electrolytes with morning labs.  Cannon Quinton A, PharmD 12/08/2020 9:38 AM

## 2020-12-08 NOTE — Progress Notes (Signed)
Subjective: Opened eyes and HR went up when husband held phone near her with her pastor speaking. Becomes tachycardic with light stimuli, requiring sedation.   Objective: Current vital signs: BP 104/60   Pulse (!) 58   Temp 100.04 F (37.8 C)   Resp 15   Ht 5' 0.98" (1.549 m)   Wt 94.3 kg   SpO2 97%   BMI 39.30 kg/m  Vital signs in last 24 hours: Temp:  [98.9 F (37.2 C)-100.58 F (38.1 C)] 100.04 F (37.8 C) (01/16 1500) Pulse Rate:  [51-147] 58 (01/16 1500) Resp:  [13-35] 15 (01/16 1500) BP: (98-185)/(53-106) 104/60 (01/16 1500) SpO2:  [97 %-100 %] 97 % (01/16 1500) FiO2 (%):  [28 %] 28 % (01/16 1200) Weight:  [94.3 kg] 94.3 kg (01/16 0500)  Intake/Output from previous day: 01/15 0701 - 01/16 0700 In: 4839.7 [I.V.:2879.7; NG/GT:1960] Out: 2320 [Urine:2320] Intake/Output this shift: Total I/O In: 1029.4 [I.V.:680.8; NG/GT:348.7] Out: 480 [Urine:480] Nutritional status:  Diet Order    None     HEENT: [place]/AT Lungs: Intubated Ext: Warm and well-perfused  Neurological Examination Mental Status: Sedated on Precedex and fentanyl. Will partially open eyes to loud voice and clapping, but will not gaze towards sounds and also does not move eyes in response to visual stimuli. No attempts to communicate. Not following commands. No spontaneous movement other than eye opening.  Cranial Nerves: II: No blink to threat. Pupils 2 mm and sluggishly reactive.   III,IV, VI: Eyes disconjugate with exotropia, near the midline. No nystagmus.   VIII: Partially opens eyes to some auditory stimuli.  IX,X: Intubated XI: Unable to assess XII: Intubated Motor/Sensory: Flaccid tone x 4. No posturing or other movement to noxious, except for stereotyped brisk dorsiflexion of toes to plantar stimulation.  Deep Tendon Reflexes: Brisk reflexes. Toes are briskly upgoing.  Cerebellar/Gait: Unable to assess  Lab Results: Results for orders placed or performed during the hospital encounter of  12/02/20 (from the past 48 hour(s))  Glucose, capillary     Status: Abnormal   Collection Time: 12/06/20  7:23 PM  Result Value Ref Range   Glucose-Capillary 130 (H) 70 - 99 mg/dL    Comment: Glucose reference range applies only to samples taken after fasting for at least 8 hours.  Glucose, capillary     Status: Abnormal   Collection Time: 12/06/20 11:05 PM  Result Value Ref Range   Glucose-Capillary 141 (H) 70 - 99 mg/dL    Comment: Glucose reference range applies only to samples taken after fasting for at least 8 hours.  Glucose, capillary     Status: Abnormal   Collection Time: 12/07/20  3:22 AM  Result Value Ref Range   Glucose-Capillary 133 (H) 70 - 99 mg/dL    Comment: Glucose reference range applies only to samples taken after fasting for at least 8 hours.  CBC with Differential/Platelet     Status: Abnormal   Collection Time: 12/07/20  4:10 AM  Result Value Ref Range   WBC 7.4 4.0 - 10.5 K/uL   RBC 3.07 (L) 3.87 - 5.11 MIL/uL   Hemoglobin 7.5 (L) 12.0 - 15.0 g/dL   HCT 24.7 (L) 36.0 - 46.0 %   MCV 80.5 80.0 - 100.0 fL   MCH 24.4 (L) 26.0 - 34.0 pg   MCHC 30.4 30.0 - 36.0 g/dL   RDW 18.7 (H) 11.5 - 15.5 %   Platelets 188 150 - 400 K/uL   nRBC 0.0 0.0 - 0.2 %   Neutrophils Relative %  76 %   Neutro Abs 5.6 1.7 - 7.7 K/uL   Lymphocytes Relative 14 %   Lymphs Abs 1.1 0.7 - 4.0 K/uL   Monocytes Relative 9 %   Monocytes Absolute 0.6 0.1 - 1.0 K/uL   Eosinophils Relative 1 %   Eosinophils Absolute 0.1 0.0 - 0.5 K/uL   Basophils Relative 0 %   Basophils Absolute 0.0 0.0 - 0.1 K/uL   Immature Granulocytes 0 %   Abs Immature Granulocytes 0.03 0.00 - 0.07 K/uL    Comment: Performed at [facility], [address], [place], [place] [address]  Basic metabolic panel     Status: Abnormal   Collection Time: 12/07/20  4:10 AM  Result Value Ref Range   Sodium 147 (H) 135 - 145 mmol/L   Potassium 3.7 3.5 - 5.1 mmol/L   Chloride 113 (H) 98 - 111 mmol/L   CO2 26 22 - 32  mmol/L   Glucose, Bld 162 (H) 70 - 99 mg/dL    Comment: Glucose reference range applies only to samples taken after fasting for at least 8 hours.   BUN 27 (H) 6 - 20 mg/dL   Creatinine, Ser 0.72 0.44 - 1.00 mg/dL   Calcium 8.4 (L) 8.9 - 10.3 mg/dL   GFR, Estimated >60 >60 mL/min    Comment: (NOTE) Calculated using the CKD-EPI Creatinine Equation (2021)    Anion gap 8 5 - 15    Comment: Performed at [facility], [address] [place], [place] [address]  Magnesium     Status: None   Collection Time: 12/07/20  4:10 AM  Result Value Ref Range   Magnesium 2.1 1.7 - 2.4 mg/dL    Comment: Performed at [facility], [address], [place], [place] [address]  Phosphorus     Status: None   Collection Time: 12/07/20  4:10 AM  Result Value Ref Range   Phosphorus 3.6 2.5 - 4.6 mg/dL    Comment: Performed at [facility], [address] [place], [place] [address]  Glucose, capillary     Status: Abnormal   Collection Time: 12/07/20  7:19 AM  Result Value Ref Range   Glucose-Capillary 141 (H) 70 - 99 mg/dL    Comment: Glucose reference range applies only to samples taken after fasting for at least 8 hours.  Glucose, capillary     Status: Abnormal   Collection Time: 12/07/20 11:33 AM  Result Value Ref Range   Glucose-Capillary 164 (H) 70 - 99 mg/dL    Comment: Glucose reference range applies only to samples taken after fasting for at least 8 hours.  Glucose, capillary     Status: Abnormal   Collection Time: 12/07/20  3:56 PM  Result Value Ref Range   Glucose-Capillary 147 (H) 70 - 99 mg/dL    Comment: Glucose reference range applies only to samples taken after fasting for at least 8 hours.  Glucose, capillary     Status: Abnormal   Collection Time: 12/07/20  7:40 PM  Result Value Ref Range   Glucose-Capillary 142 (H) 70 - 99 mg/dL    Comment: Glucose reference range applies only to samples taken after fasting for at least 8 hours.  Glucose, capillary      Status: Abnormal   Collection Time: 12/07/20 11:52 PM  Result Value Ref Range   Glucose-Capillary 143 (H) 70 - 99 mg/dL    Comment: Glucose reference range applies only to samples taken after fasting for at least 8 hours.  Glucose, capillary     Status: Abnormal   Collection Time: 12/08/20  4:13 AM  Result Value Ref Range   Glucose-Capillary 141 (H) 70 - 99 mg/dL    Comment: Glucose reference range applies only to samples taken after fasting for at least 8 hours.  CBC with Differential/Platelet     Status: Abnormal   Collection Time: 12/08/20  5:00 AM  Result Value Ref Range   WBC 10.0 4.0 - 10.5 K/uL   RBC 3.21 (L) 3.87 - 5.11 MIL/uL   Hemoglobin 7.7 (L) 12.0 - 15.0 g/dL   HCT 26.3 (L) 36.0 - 46.0 %   MCV 81.9 80.0 - 100.0 fL   MCH 24.0 (L) 26.0 - 34.0 pg   MCHC 29.3 (L) 30.0 - 36.0 g/dL   RDW 18.9 (H) 11.5 - 15.5 %   Platelets 184 150 - 400 K/uL   nRBC 0.2 0.0 - 0.2 %   Neutrophils Relative % 69 %   Neutro Abs 6.9 1.7 - 7.7 K/uL   Lymphocytes Relative 21 %   Lymphs Abs 2.1 0.7 - 4.0 K/uL   Monocytes Relative 7 %   Monocytes Absolute 0.7 0.1 - 1.0 K/uL   Eosinophils Relative 2 %   Eosinophils Absolute 0.2 0.0 - 0.5 K/uL   Basophils Relative 0 %   Basophils Absolute 0.0 0.0 - 0.1 K/uL   Immature Granulocytes 1 %   Abs Immature Granulocytes 0.07 0.00 - 0.07 K/uL    Comment: Performed at [facility], [address] [place], [place] [address]  Basic metabolic panel     Status: Abnormal   Collection Time: 12/08/20  5:00 AM  Result Value Ref Range   Sodium 147 (H) 135 - 145 mmol/L   Potassium 3.4 (L) 3.5 - 5.1 mmol/L   Chloride 113 (H) 98 - 111 mmol/L   CO2 24 22 - 32 mmol/L   Glucose, Bld 197 (H) 70 - 99 mg/dL    Comment: Glucose reference range applies only to samples taken after fasting for at least 8 hours.   BUN 33 (H) 6 - 20 mg/dL   Creatinine, Ser 0.80 0.44 - 1.00 mg/dL   Calcium 8.4 (L) 8.9 - 10.3 mg/dL   GFR, Estimated >60 >60 mL/min    Comment:  (NOTE) Calculated using the CKD-EPI Creatinine Equation (2021)    Anion gap 10 5 - 15    Comment: Performed at [facility], [address] [place], [place] [address]  Magnesium     Status: None   Collection Time: 12/08/20  5:00 AM  Result Value Ref Range   Magnesium 2.1 1.7 - 2.4 mg/dL    Comment: Performed at [facility], [address], [place], [place] [address]  Phosphorus     Status: None   Collection Time: 12/08/20  5:00 AM  Result Value Ref Range   Phosphorus 3.7 2.5 - 4.6 mg/dL    Comment: Performed at [facility], [address] [place], [place] [address]  Glucose, capillary     Status: Abnormal   Collection Time: 12/08/20  7:15 AM  Result Value Ref Range   Glucose-Capillary 147 (H) 70 - 99 mg/dL    Comment: Glucose reference range applies only to samples taken after fasting for at least 8 hours.  Glucose, capillary     Status: Abnormal   Collection Time: 12/08/20 11:02 AM  Result Value Ref Range   Glucose-Capillary 133 (H) 70 - 99 mg/dL    Comment: Glucose  reference range applies only to samples taken after fasting for at least 8 hours.    Recent Results (from the past 240 hour(s))  Resp Panel by RT-PCR (Flu A&B, Covid) Nasopharyngeal Swab     Status: None   Collection Time: 12/02/20  7:23 AM   Specimen: Nasopharyngeal Swab; Nasopharyngeal(NP) swabs in vial transport medium  Result Value Ref Range Status   SARS Coronavirus 2 by RT PCR NEGATIVE NEGATIVE Final    Comment: (NOTE) SARS-CoV-2 target nucleic acids are NOT DETECTED.  The SARS-CoV-2 RNA is generally detectable in upper respiratory specimens during the acute phase of infection. The lowest concentration of SARS-CoV-2 viral copies this assay can detect is 138 copies/mL. A negative result does not preclude SARS-Cov-2 infection and should not be used as the sole basis for treatment or other patient management decisions. A negative result may occur with  improper specimen  collection/handling, submission of specimen other than nasopharyngeal swab, presence of viral mutation(s) within the areas targeted by this assay, and inadequate number of viral copies(<138 copies/mL). A negative result must be combined with clinical observations, patient history, and epidemiological information. The expected result is Negative.  Fact Sheet for Patients:  EntrepreneurPulse.com.au  Fact Sheet for Healthcare Providers:  IncredibleEmployment.be  This test is no t yet approved or cleared by the [place] FDA and  has been authorized for detection and/or diagnosis of SARS-CoV-2 by FDA under an Emergency Use Authorization (EUA). This EUA will remain  in effect (meaning this test can be used) for the duration of the COVID-19 declaration under Section 564(b)(1) of the Act, 21 U.S.C.section 360bbb-3(b)(1), unless the authorization is terminated  or revoked sooner.       Influenza A by PCR NEGATIVE NEGATIVE Final   Influenza B by PCR NEGATIVE NEGATIVE Final    Comment: (NOTE) The Xpert Xpress SARS-CoV-2/FLU/RSV plus assay is intended as an aid in the diagnosis of influenza from Nasopharyngeal swab specimens and should not be used as a sole basis for treatment. Nasal washings and aspirates are unacceptable for Xpert Xpress SARS-CoV-2/FLU/RSV testing.  Fact Sheet for Patients: EntrepreneurPulse.com.au  Fact Sheet for Healthcare Providers: IncredibleEmployment.be  This test is not yet approved or cleared by the [place] FDA and has been authorized for detection and/or diagnosis of SARS-CoV-2 by FDA under an Emergency Use Authorization (EUA). This EUA will remain in effect (meaning this test can be used) for the duration of the COVID-19 declaration under Section 564(b)(1) of the Act, 21 U.S.C. section 360bbb-3(b)(1), unless the authorization is terminated or revoked.  Performed at [facility], [address], [place], [place] [address]   Urine culture     Status: None   Collection Time: 12/02/20  7:23 AM   Specimen: In/Out Cath Urine  Result Value Ref Range Status   Specimen Description   Final    IN/OUT CATH URINE Performed at [facility], [address], [place], [place] [address]    Special Requests   Final    NONE Performed at [facility], [address], [place], [place] [address]    Culture   Final    NO GROWTH Performed at [facility], [place] [address] [place],  [address]    Report Status 12/03/2020 FINAL  Final  Blood Culture (routine x 2)     Status: None   Collection Time: 12/02/20  8:37 AM   Specimen: BLOOD  Result Value Ref Range Status   Specimen Description BLOOD BLOOD RIGHT HAND  Final   Special Requests   Final    BOTTLES DRAWN AEROBIC AND ANAEROBIC Blood Culture adequate volume   Culture   Final    NO GROWTH 5 DAYS Performed at [facility], [address] [place], [place] [address]    Report Status 12/07/2020 FINAL  Final  Blood Culture (routine x 2)     Status: None   Collection Time: 12/02/20  8:37 AM   Specimen: BLOOD  Result Value Ref Range Status   Specimen Description BLOOD RIGHT ARM  Final   Special Requests   Final    BOTTLES DRAWN AEROBIC AND ANAEROBIC Blood Culture adequate volume   Culture   Final    NO GROWTH 5 DAYS Performed at [facility], [address], [place], [place] [address]    Report Status 12/07/2020 FINAL  Final  MRSA PCR Screening     Status: None   Collection Time: 12/02/20  9:33 AM   Specimen: Nasopharyngeal  Result Value Ref Range Status   MRSA by PCR NEGATIVE NEGATIVE Final    Comment:        The GeneXpert MRSA Assay (FDA approved for NASAL specimens only), is one component of a comprehensive MRSA colonization surveillance program. It is not intended to diagnose MRSA infection nor to guide or monitor treatment for MRSA  infections. Performed at [facility], [address] [place], [place] [address]     Lipid Panel No results for input(s): CHOL, TRIG, HDL, CHOLHDL, VLDL, LDLCALC in the last 72 hours.  Studies/Results: MR BRAIN W WO CONTRAST  Result Date: 12/07/2020 CLINICAL DATA:  Cardiac arrest EXAM: MRI HEAD WITHOUT AND WITH CONTRAST TECHNIQUE: Multiplanar, multiecho pulse sequences of the brain and surrounding structures were obtained without and with intravenous contrast. CONTRAST:  7.8m GADAVIST GADOBUTROL 1 MMOL/ML IV SOLN COMPARISON:  Brain MRI 12/04/2020 FINDINGS: Brain: There is multifocal cortical diffusion abnormality, greatest in the parietal and temporal lobes. This has progressed from the prior study. There is also progressive diffusion abnormality within the basal ganglia. No acute or chronic hemorrhage. Normal white matter signal, parenchymal volume and CSF spaces. The midline structures are normal. There is no abnormal contrast enhancement. Vascular: Major flow voids are preserved. Skull and upper cervical spine: Normal calvarium and skull base. Visualized upper cervical spine and soft tissues are normal. Sinuses/Orbits:No paranasal sinus fluid levels or advanced mucosal thickening. No mastoid or middle ear effusion. Normal orbits. IMPRESSION: 1. Progressive DWI and FLAIR abnormality within the parietotemporal cortices and the bilateral corpus striatum, consistent with anoxic brain injury. 2. No hemorrhage or mass effect. Electronically Signed   By: KUlyses JarredM.D.   On: 12/07/2020 21:56   DG Chest Port 1 View  Result Date: 12/07/2020 CLINICAL DATA:  Endotracheal tube placement EXAM: PORTABLE CHEST 1 VIEW COMPARISON:  December 03, 2020 FINDINGS: The endotracheal tube, right central venous catheter, and enteric tube all appear to be grossly well position. The lung volumes are low. The heart size is stable but enlarged. There is atelectasis at the lung bases with probable small bilateral  pleural effusions. There is a dense retrocardiac opacity favored to represent atelectasis. There is no pneumothorax. There is no definite acute osseous abnormality. IMPRESSION: 1. Lines and tubes as above. 2. Low lung volumes with bibasilar atelectasis and probable small bilateral pleural effusions. Electronically Signed   By: CConstance HolsterM.D.   On: 12/07/2020 06:01    Medications:  Scheduled: . amLODipine  10 mg Per Tube Daily  .  chlorhexidine gluconate (MEDLINE KIT)  15 mL Mouth Rinse BID  . Chlorhexidine Gluconate Cloth  6 each Topical Daily  . feeding supplement (PROSource TF)  45 mL Per Tube BID  . feeding supplement (VITAL AF 1.2 CAL)  1,000 mL Per Tube Q24H  . heparin  5,000 Units Subcutaneous Q8H  . insulin aspart  0-15 Units Subcutaneous Q4H  . mouth rinse  15 mL Mouth Rinse 10 times per day  . multivitamin with minerals  1 tablet Per Tube Daily  . pantoprazole (PROTONIX) IV  40 mg Intravenous QHS   Continuous: . sodium chloride 250 mL (12/08/20 1341)  . dexmedetomidine (PRECEDEX) IV infusion 1.2 mcg/kg/hr (12/08/20 1500)  . fentaNYL infusion INTRAVENOUS 150 mcg/hr (12/08/20 1500)  . propofol (DIPRIVAN) infusion Stopped (12/06/20 1121)   Repeat MRI brain 1/15:  Progressive DWI and FLAIR abnormality within the parietotemporal cortices and the bilateral corpus striatum, consistent with anoxic brain injury.   Assessment: 55 year old female with myoclonic jerking and unresponsiveness following cardiac arrest.  1. Exam on sedation with Precedex and fentanyl is essentially unchanged from my initial exam on 1/12. Will open eyes to some auditory and tactile stimuli, but there is no tracking of visual stimuli, she is not following commands, there are no attempts to communicate, upper and lower extremities are flaccid without movement to noxious except for brisk dorsiflexion of toes.  2. Again, the above exam findings are while she is on sedation and repeat exam after 72 hours off  sedation would be helpful to more accurately prognosticate. Overall impression is that she is in a minimally conscious state, with potential for an improved exam when off sedation.  3. Repeat MRI brain reveals interval progression of findings that are most consistent with diffuse anoxic brain injury.  4. EEG on 1/12 with continuous generalized slowing, consistent with a severe diffuse encephalopathy. When off sedation, would obtain a repeat EEG to assess for possible improvement and to assist with prognostication.   Recommendations: 1. Continue IV sedation and wean slowly as able  2. If myoclonus recurs, load with Keppra 2000 mg and call Neurology.  3. Will need repeat neurological exam for prognosis after all sedation has been off for 72 hours.    40 minutes spent in the neurological evaluation and management of this critically ill patient.    LOS: 6 days   @Electronically  signed: Dr. Kerney Elbe 12/08/2020  3:58 PM

## 2020-12-09 ENCOUNTER — Inpatient Hospital Stay: Payer: Self-pay

## 2020-12-09 LAB — BASIC METABOLIC PANEL
Anion gap: 9 (ref 5–15)
BUN: 34 mg/dL — ABNORMAL HIGH (ref 6–20)
CO2: 26 mmol/L (ref 22–32)
Calcium: 8.3 mg/dL — ABNORMAL LOW (ref 8.9–10.3)
Chloride: 115 mmol/L — ABNORMAL HIGH (ref 98–111)
Creatinine, Ser: 0.69 mg/dL (ref 0.44–1.00)
GFR, Estimated: >60 mL/min (ref >60)
Glucose, Bld: 137 mg/dL — ABNORMAL HIGH (ref 70–99)
Potassium: 3.9 mmol/L (ref 3.5–5.1)
Sodium: 150 mmol/L — ABNORMAL HIGH (ref 135–145)

## 2020-12-09 LAB — GLUCOSE, CAPILLARY
Glucose-Capillary: 114 mg/dL — ABNORMAL HIGH (ref 70–99)
Glucose-Capillary: 117 mg/dL — ABNORMAL HIGH (ref 70–99)
Glucose-Capillary: 121 mg/dL — ABNORMAL HIGH (ref 70–99)
Glucose-Capillary: 139 mg/dL — ABNORMAL HIGH (ref 70–99)
Glucose-Capillary: 146 mg/dL — ABNORMAL HIGH (ref 70–99)
Glucose-Capillary: 148 mg/dL — ABNORMAL HIGH (ref 70–99)
Glucose-Capillary: 151 mg/dL — ABNORMAL HIGH (ref 70–99)

## 2020-12-09 LAB — CBC
HCT: 27.5 % — ABNORMAL LOW (ref 36.0–46.0)
Hemoglobin: 8.3 g/dL — ABNORMAL LOW (ref 12.0–15.0)
MCH: 24.6 pg — ABNORMAL LOW (ref 26.0–34.0)
MCHC: 30.2 g/dL (ref 30.0–36.0)
MCV: 81.4 fL (ref 80.0–100.0)
Platelets: 204 10*3/uL (ref 150–400)
RBC: 3.38 MIL/uL — ABNORMAL LOW (ref 3.87–5.11)
RDW: 18.7 % — ABNORMAL HIGH (ref 11.5–15.5)
WBC: 13.7 10*3/uL — ABNORMAL HIGH (ref 4.0–10.5)
nRBC: 0.2 % (ref 0.0–0.2)

## 2020-12-09 LAB — CBC WITH DIFFERENTIAL/PLATELET
Abs Immature Granulocytes: 0.04 10*3/uL (ref 0.00–0.07)
Basophils Absolute: 0.1 10*3/uL (ref 0.0–0.1)
Basophils Relative: 1 %
Eosinophils Absolute: 0.4 10*3/uL (ref 0.0–0.5)
Eosinophils Relative: 4 %
HCT: 23.1 % — ABNORMAL LOW (ref 36.0–46.0)
Hemoglobin: 6.9 g/dL — ABNORMAL LOW (ref 12.0–15.0)
Immature Granulocytes: 0 %
Lymphocytes Relative: 16 %
Lymphs Abs: 1.5 10*3/uL (ref 0.7–4.0)
MCH: 24.7 pg — ABNORMAL LOW (ref 26.0–34.0)
MCHC: 29.9 g/dL — ABNORMAL LOW (ref 30.0–36.0)
MCV: 82.8 fL (ref 80.0–100.0)
Monocytes Absolute: 0.7 10*3/uL (ref 0.1–1.0)
Monocytes Relative: 8 %
Neutro Abs: 6.6 10*3/uL (ref 1.7–7.7)
Neutrophils Relative %: 71 %
Platelets: 187 10*3/uL (ref 150–400)
RBC: 2.79 MIL/uL — ABNORMAL LOW (ref 3.87–5.11)
RDW: 19.1 % — ABNORMAL HIGH (ref 11.5–15.5)
WBC: 9.3 10*3/uL (ref 4.0–10.5)
nRBC: 0 % (ref 0.0–0.2)

## 2020-12-09 LAB — MAGNESIUM: Magnesium: 2.1 mg/dL (ref 1.7–2.4)

## 2020-12-09 LAB — PREPARE RBC (CROSSMATCH)

## 2020-12-09 LAB — PHOSPHORUS: Phosphorus: 4.2 mg/dL (ref 2.5–4.6)

## 2020-12-09 LAB — ABO/RH: ABO/RH(D): B POS

## 2020-12-09 IMAGING — DX DG CHEST 1V PORT
1 series · 1 of 1 positions shown · non-contrast
Comparison: [DATE]

CLINICAL DATA: Acute respiratory failure

EXAM:
PORTABLE CHEST 1 VIEW

[chest ap]
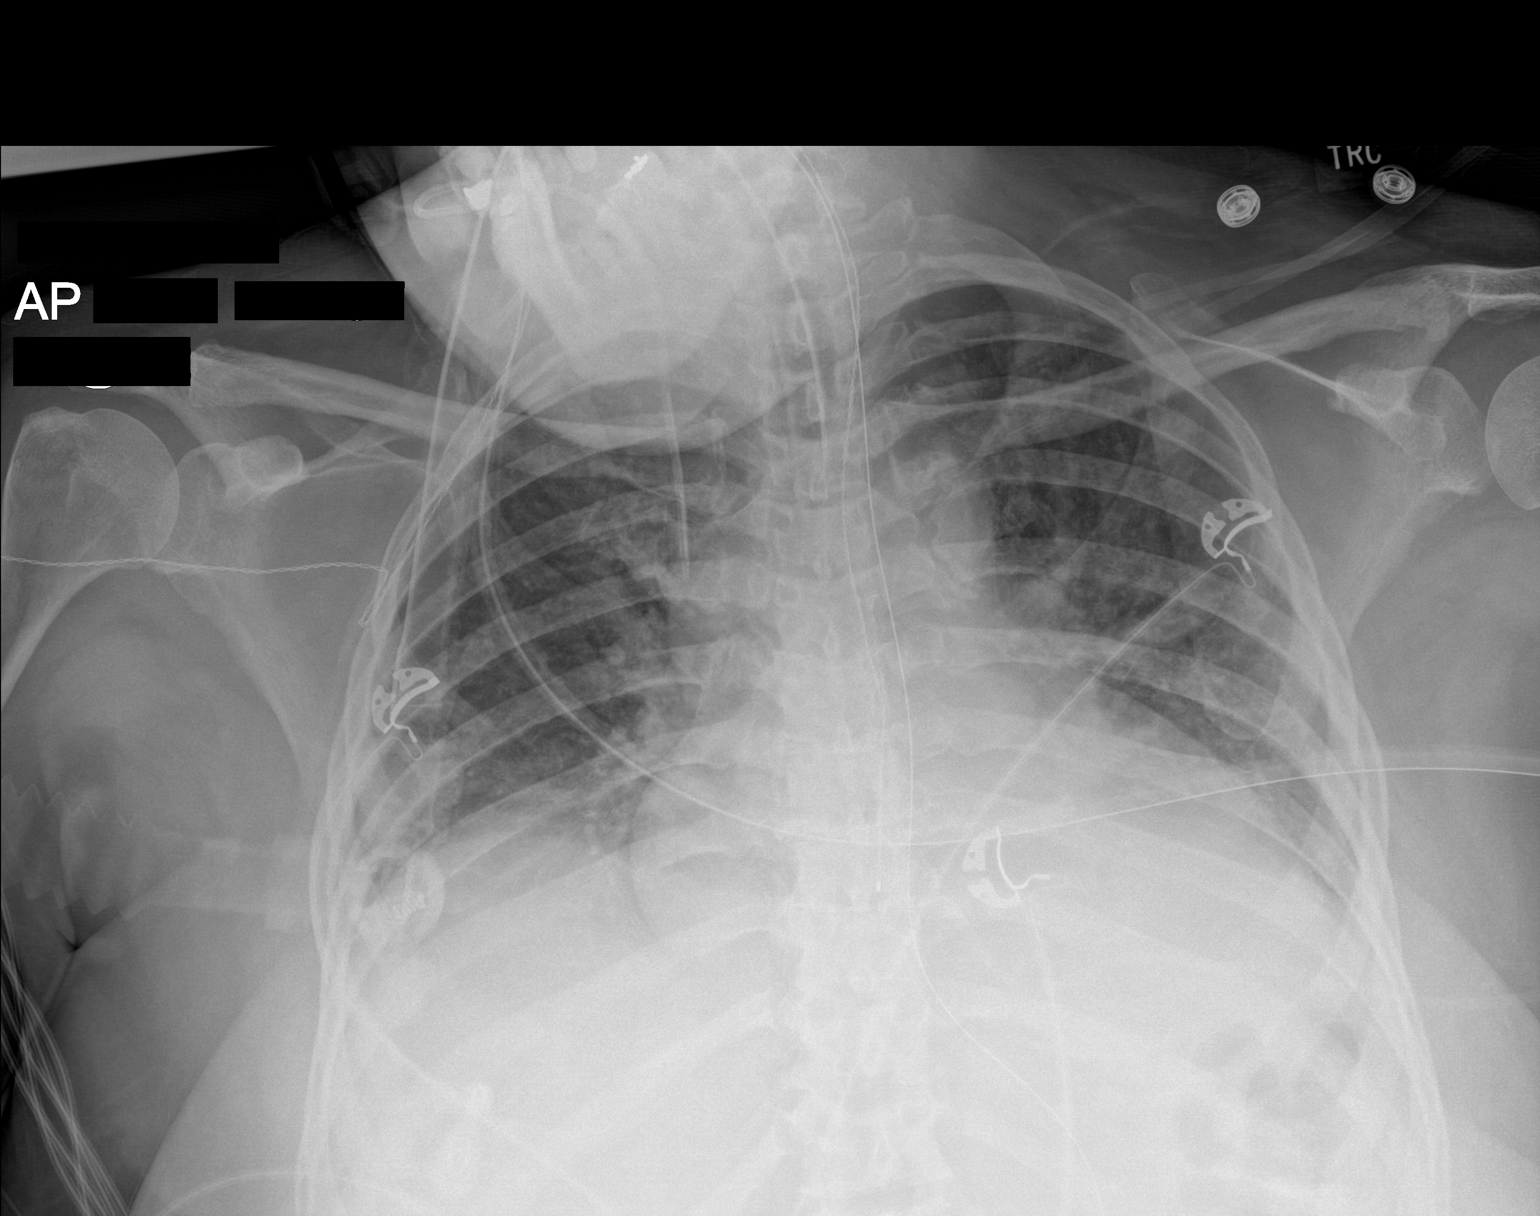

[1 of 1 positions shown; findings below may reference images not displayed]

FINDINGS: The support devices appear stable from prior study. There is no
pneumothorax. The heart size remains enlarged. Low lung volumes and
bilateral airspace opacities are noted. There are probable small
bilateral pleural effusions. There is no pneumothorax.
IMPRESSION: No significant interval change.

## 2020-12-09 MED ORDER — MIDAZOLAM HCL 2 MG/2ML IJ SOLN
INTRAMUSCULAR | Status: AC
  Filled 2020-12-09: qty 4

## 2020-12-09 MED ORDER — FREE WATER
200.0000 mL | Status: DC
  Administered 2020-12-09 – 2020-12-11 (×14): 200 mL

## 2020-12-09 MED ORDER — SODIUM CHLORIDE 0.9% IV SOLUTION: Freq: Once | INTRAVENOUS | Status: DC

## 2020-12-09 MED ORDER — POTASSIUM CHLORIDE 20 MEQ PO PACK
40.0000 meq | PACK | Freq: Once | ORAL | Status: AC
  Administered 2020-12-09: 40 meq
  Filled 2020-12-09: qty 2

## 2020-12-09 MED ORDER — MIDAZOLAM HCL 2 MG/2ML IJ SOLN
4.0000 mg | Freq: Once | INTRAMUSCULAR | Status: AC
  Administered 2020-12-09: 4 mg via INTRAVENOUS

## 2020-12-09 MED ORDER — MIDAZOLAM HCL 2 MG/2ML IJ SOLN
2.0000 mg | INTRAMUSCULAR | Status: DC | PRN
  Administered 2020-12-09 – 2021-01-01 (×25): 2 mg via INTRAVENOUS
  Filled 2020-12-09 (×25): qty 2

## 2020-12-09 NOTE — Progress Notes (Signed)
Nutrition Follow-up  RD working remotely.  DOCUMENTATION CODES:   Obesity unspecified  INTERVENTION:  Continue Vital AF 1.2 Cal at 40 mL/hr (960 mL goal daily volume) + PROSource TF 45 mL BID per tube. Provides 1232 kcal, 94 grams of protein, 778 mL H2O daily.   Continue MVI daily per tube.  Consider increasing bowel regimen as patient still has not had a documented bowel movement this admission.  NUTRITION DIAGNOSIS:   Inadequate oral intake related to inability to eat as evidenced by NPO status.  Ongoing.  GOAL:   Patient will meet greater than or equal to 90% of their needs  Met with TF regimen.  MONITOR:   Vent status,Labs,Weight trends,TF tolerance,I & O's  REASON FOR ASSESSMENT:   Ventilator,Consult Enteral/tube feeding initiation and management  ASSESSMENT:   55 year old female with PMHx of HTN admitted after acute cardiac arrest also with DKA and severe acidosis.  1/10 intubated   Patient is currently intubated on ventilator support MV: 7 L/min Temp (24hrs), Avg:100.1 F (37.8 C), Min:99.5 F (37.5 C), Max:100.58 F (38.1 C)  Medications reviewed and include: free water 200 mL Q4hrs, Novolog 0-15 units Q4hrs, MVI daily, Protonix, Miralax, senna-docusate 1 tablet daily, Precedex gtt, fentanyl gtt.  Labs reviewed: CBG 117-146, Sodium 150, Chloride 115, BUN 34. Potassium, Phosphorus, and Magnesium WNL.  I/O: 1345 mL UOP yesterday (0.6 mL/kg/hr)  Weight trend: 94.3 kg on 1/16; +8.1 kg from 1/10; pt +6189 mL from admission so suspect related to fluid retention; per RN documentation patient has non-pitting edema  Enteral Access: 16 Fr. OGT placed 1/10; terminates in stomach per chest x-ray 1/10  TF regimen: Vital AF 1.2 Cal at 40 mL/hr + PROSource TF 45 mL BID  Per neurology note pt with myoclonic jerking. Repeat MRI brain 1/15 reveals interval progression of findings most consistent with diffuse anoxic brain injury. Plan for repeat neurological exam for  prognosis after all sedation has been off for 72 hours.   Diet Order:   Diet Order    None     EDUCATION NEEDS:   No education needs have been identified at this time  Skin:  Skin Assessment: Reviewed RN Assessment (blister to coccyx)  Last BM:  Unknown/PTA  Height:   Ht Readings from Last 1 Encounters:  12/02/20 5' 0.98" (1.549 m)   Weight:   Wt Readings from Last 1 Encounters:  12/08/20 94.3 kg   Ideal Body Weight:  47.7 kg  BMI:  Body mass index is 39.3 kg/m.  Estimated Nutritional Needs:   Kcal:  [phone] (11-14 kcal/kg)  Protein:  95 grams (2 grams/kg IBW)  Fluid:  >/= 2 L/day  Jacklynn Barnacle, MS, RD, LDN Pager number available on Amion

## 2020-12-09 NOTE — Progress Notes (Signed)
NAME:  [name] [name], MRN:  [id number], DOB:  06-26-1966, LOS: 7 ADMISSION DATE:  12/02/2020, CONSULTATION DATE:  12/02/2020 REFERRING MD:  Jodell Cipro, CHIEF COMPLAINT:  Cardiac arrest  Brief History:  [name] with acute cardiac arrest Labs c/w severe acidosis and severe DKA Patient with witnessed V fib arrest and shocked in the field ROSC returned is unknown  1/10 admitted to ICU for cardiac arrest, started hypothermia protocol 1/11 hypothermia protocol +posturing 1/12 signs of brain damage, needs NEURO consultation 1/13 failed SAT/SBT, unable to follow commands 1/14- for SBT today, electrolytes are improved. Encephalopathic working on mental status.  12/07/20- Patient remains crtically ill and is with interval worsening MRI brain, I met with husband to briefly review.  Discussed case with Neurologist today and there is plan for meeting with family.  Past Medical History:  Hypertension  Significant Hospital Events:  ETT 1/10>>   Consults:  Neurology Palliative Care  Procedures:  ETT 1/10 CVL 1/11  Significant Diagnostic Tests:  12/07/2020 MRI Brain 1. Progressive DWI and FLAIR abnormality within the parietotemporal cortices and the bilateral corpus striatum, consistent with anoxic brain injury. 2. No hemorrhage or mass effect.  Echo 12/03/2020 1. Left ventricular ejection fraction, by estimation, is 60 to 65%. The  left ventricle has normal function. The left ventricle has no regional  wall motion abnormalities. There is mild left ventricular hypertrophy.  Left ventricular diastolic parameters  were normal.  2. Right ventricular systolic function is normal. The right ventricular  size is normal. Tricuspid regurgitation signal is inadequate for assessing  PA pressure.  3. A small pericardial effusion is present. The pericardial effusion is  posterior and lateral to the left ventricle.  4. The mitral valve is grossly normal. No evidence of mitral valve   regurgitation. No evidence of mitral stenosis.  5. The aortic valve is tricuspid. Aortic valve regurgitation is not  visualized. No aortic stenosis is present.  6. The inferior vena cava is normal in size with greater than 50%  respiratory variability, suggesting right atrial pressure of 3 mmHg.  EEG 12/04/2020 This study is suggestive of severe diffuse encephalopathy, nonspecific etiology. No seizures or epileptiform discharges were seen throughout the recording  Micro Data:  12/02/20 MRSA Screen Negative 12/02/20 Bld Cx negative  Antimicrobials:  Ceftriaxone 1/10 - 1/12  Interim History / Subjective:  No acute events overnight. Sedation weaning this morning and patient tachycardic and tachypneic.   Objective   Blood pressure (!) 210/87, pulse (!) 143, temperature 100.22 F (37.9 C), resp. rate (!) 35, height 5' 0.98" (1.549 m), weight 94.3 kg, SpO2 98 %. CVP:  [5 mmHg-36 mmHg] 18 mmHg  Vent Mode: PRVC FiO2 (%):  [28 %] 28 % Set Rate:  [15 bmp] 15 bmp Vt Set:  [500 mL] 500 mL PEEP:  [5 cmH20] 5 cmH20 Plateau Pressure:  [17 cmH20-26 cmH20] 26 cmH20   Intake/Output Summary (Last 24 hours) at 12/09/2020 1215 Last data filed at 12/09/2020 0618 Gross per 24 hour  Intake 2018.23 ml  Output 1065 ml  Net 953.23 ml   Filed Weights   12/06/20 0435 12/07/20 0455 12/08/20 0500  Weight: 94.3 kg 94.4 kg 94.3 kg    Examination: General: Intubated, moderate distress HENT: [place]/AT, ET tube in place, sclera anicteric Lungs: Course respirations, tachypneic Cardiovascular: tachycardic, s1s2, no murmur Abdomen: soft, non-distended, BS+ Extremities: warm, no edema Neuro: awake, does not follow commands. Gag +, corneal reflex + GU: foley in place  Resolved Hospital Problem list  Acute Kidney Injury  Assessment & Plan:   Severe Anoxic Brain Injury In setting of cardiac arrest and prolonged down time - Neurology Following - Patient very tachycardic/tachypneic with sedation wean this  AM - Will continue sedation for comfort - Poor long term prognosis, neurology and palliative care following  Cardiac Arrest -s/p hypothermia protocol - Echo with normal EF and no wall motion abnormalities - Continue telemetry monitoring  Acute Hypoxemic Respiratory Failure In setting of cardiac arrest and anoxic brain injury - Requiring minimal vent settings at this time - Keep intubated for airway protection for now  Anemia Some vaginal bleeding has been noted, but not a concerning amount at this time. Blood draws possibly contributing since hospitalization - Transfusing 1 unit PRBCs today - Trend CBC  Best practice (evaluated daily)  Diet: TF Pain/Anxiety/Delirium protocol (if indicated): Precedex, Fentanyl, PRN versed VAP protocol (if indicated): Ordered DVT prophylaxis: heparin GI prophylaxis: PPI daily Glucose control: SSI Mobility: bed rest Disposition:ICU Code Status: Intubate Only  Labs   CBC: Recent Labs  Lab 12/05/20 0436 12/06/20 0440 12/07/20 0410 12/08/20 0500 12/09/20 0254  WBC 9.0 8.1 7.4 10.0 9.3  NEUTROABS 6.8 5.5 5.6 6.9 6.6  HGB 7.0* 7.1* 7.5* 7.7* 6.9*  HCT 23.7* 24.9* 24.7* 26.3* 23.1*  MCV 82.9 84.4 80.5 81.9 82.8  PLT 180 180 188 184 811    Basic Metabolic Panel: Recent Labs  Lab 12/05/20 0436 12/06/20 0440 12/07/20 0410 12/08/20 0500 12/09/20 0254  NA 146* 147* 147* 147* 150*  K 3.3* 4.3 3.7 3.4* 3.9  CL 111 115* 113* 113* 115*  CO2 26 26 26 24 26   GLUCOSE 128* 136* 162* 197* 137*  BUN 21* 24* 27* 33* 34*  CREATININE 0.91 0.75 0.72 0.80 0.69  CALCIUM 8.5* 8.6* 8.4* 8.4* 8.3*  MG 2.1 2.2 2.1 2.1 2.1  PHOS 3.1 3.8 3.6 3.7 4.2   GFR: Estimated Creatinine Clearance: 84.3 mL/min (by C-G formula based on SCr of 0.69 mg/dL). Recent Labs  Lab 12/02/20 1658 12/03/20 0539 12/04/20 0435 12/06/20 0440 12/07/20 0410 12/08/20 0500 12/09/20 0254  WBC  --   --    < > 8.1 7.4 10.0 9.3  LATICACIDVEN 5.2* 1.3  --   --   --   --   --     < > = values in this interval not displayed.    Liver Function Tests: Recent Labs  Lab 12/04/20 0435  AST 72*  ALT 89*  ALKPHOS 59  BILITOT 0.5  PROT 7.1  ALBUMIN 2.9*   No results for input(s): LIPASE, AMYLASE in the last 168 hours. No results for input(s): AMMONIA in the last 168 hours.  ABG    Component Value Date/Time   PHART 7.35 12/02/2020 0933   PCO2ART 47 12/02/2020 0933   PO2ART 70 (L) 12/02/2020 0933   HCO3 25.9 12/02/2020 0933   O2SAT 92.9 12/02/2020 0933     Coagulation Profile: No results for input(s): INR, PROTIME in the last 168 hours.  Cardiac Enzymes: No results for input(s): CKTOTAL, CKMB, CKMBINDEX, TROPONINI in the last 168 hours.  HbA1C: Hgb A1c MFr Bld  Date/Time Value Ref Range Status  12/02/2020 09:36 AM 5.9 (H) 4.8 - 5.6 % Final    Comment:    (NOTE) Pre diabetes:          5.7%-6.4%  Diabetes:              >6.4%  Glycemic control for   <7.0% adults with diabetes  CBG: Recent Labs  Lab 12/08/20 1935 12/09/20 0008 12/09/20 0336 12/09/20 0735 12/09/20 1146  GLUCAP 149* 121* 146* 117* 151*      Critical care time: 45 minutes     Freda Jackson, MD [facility] Office: [phone]   See Amion for Pager Details

## 2020-12-09 NOTE — Progress Notes (Signed)
Turned sedation medications off this morning. Pt became tachycardic in the 150's, hypertensive sbp 200's, and tachypnic RR in the 40's. MD made aware, PRNs given to help make pt comfortable again with no relief. MD gave one time order for 4 mg versed with relief. Pts VS have remained stable since, will continue to monitor.

## 2020-12-10 DIAGNOSIS — J9601 Acute respiratory failure with hypoxia: Secondary | ICD-10-CM

## 2020-12-10 DIAGNOSIS — Z978 Presence of other specified devices: Secondary | ICD-10-CM

## 2020-12-10 LAB — RENAL FUNCTION PANEL
Albumin: 2.3 g/dL — ABNORMAL LOW (ref 3.5–5.0)
Anion gap: 11 (ref 5–15)
BUN: 22 mg/dL — ABNORMAL HIGH (ref 6–20)
CO2: 25 mmol/L (ref 22–32)
Calcium: 8.2 mg/dL — ABNORMAL LOW (ref 8.9–10.3)
Chloride: 111 mmol/L (ref 98–111)
Creatinine, Ser: 0.66 mg/dL (ref 0.44–1.00)
GFR, Estimated: >60 mL/min (ref >60)
Glucose, Bld: 154 mg/dL — ABNORMAL HIGH (ref 70–99)
Phosphorus: 3.9 mg/dL (ref 2.5–4.6)
Potassium: 3.6 mmol/L (ref 3.5–5.1)
Sodium: 147 mmol/L — ABNORMAL HIGH (ref 135–145)

## 2020-12-10 LAB — CBC WITH DIFFERENTIAL/PLATELET
Abs Immature Granulocytes: 0.1 10*3/uL — ABNORMAL HIGH (ref 0.00–0.07)
Basophils Absolute: 0.1 10*3/uL (ref 0.0–0.1)
Basophils Relative: 0 %
Eosinophils Absolute: 0.3 10*3/uL (ref 0.0–0.5)
Eosinophils Relative: 2 %
HCT: 28.4 % — ABNORMAL LOW (ref 36.0–46.0)
Hemoglobin: 8.6 g/dL — ABNORMAL LOW (ref 12.0–15.0)
Immature Granulocytes: 1 %
Lymphocytes Relative: 13 %
Lymphs Abs: 1.9 10*3/uL (ref 0.7–4.0)
MCH: 24.6 pg — ABNORMAL LOW (ref 26.0–34.0)
MCHC: 30.3 g/dL (ref 30.0–36.0)
MCV: 81.1 fL (ref 80.0–100.0)
Monocytes Absolute: 1.2 10*3/uL — ABNORMAL HIGH (ref 0.1–1.0)
Monocytes Relative: 8 %
Neutro Abs: 10.8 10*3/uL — ABNORMAL HIGH (ref 1.7–7.7)
Neutrophils Relative %: 76 %
Platelets: 214 10*3/uL (ref 150–400)
RBC: 3.5 MIL/uL — ABNORMAL LOW (ref 3.87–5.11)
RDW: 18.6 % — ABNORMAL HIGH (ref 11.5–15.5)
WBC: 14.3 10*3/uL — ABNORMAL HIGH (ref 4.0–10.5)
nRBC: 0.1 % (ref 0.0–0.2)

## 2020-12-10 LAB — TYPE AND SCREEN
ABO/RH(D): B POS
Antibody Screen: NEGATIVE
Unit division: 0

## 2020-12-10 LAB — URINALYSIS, ROUTINE W REFLEX MICROSCOPIC
Bacteria, UA: NONE SEEN
Bilirubin Urine: NEGATIVE
Glucose, UA: NEGATIVE mg/dL
Hgb urine dipstick: NEGATIVE
Ketones, ur: NEGATIVE mg/dL
Nitrite: NEGATIVE
Protein, ur: 30 mg/dL — AB
Specific Gravity, Urine: 1.026 (ref 1.005–1.030)
pH: 5 (ref 5.0–8.0)

## 2020-12-10 LAB — BPAM RBC
Blood Product Expiration Date: 202202112359
ISSUE DATE / TIME: 202201171148
Unit Type and Rh: 7300

## 2020-12-10 LAB — GLUCOSE, CAPILLARY
Glucose-Capillary: 139 mg/dL — ABNORMAL HIGH (ref 70–99)
Glucose-Capillary: 139 mg/dL — ABNORMAL HIGH (ref 70–99)
Glucose-Capillary: 152 mg/dL — ABNORMAL HIGH (ref 70–99)
Glucose-Capillary: 161 mg/dL — ABNORMAL HIGH (ref 70–99)
Glucose-Capillary: 146 mg/dL — ABNORMAL HIGH (ref 70–99)
Glucose-Capillary: 138 mg/dL — ABNORMAL HIGH (ref 70–99)

## 2020-12-10 LAB — MAGNESIUM: Magnesium: 2 mg/dL (ref 1.7–2.4)

## 2020-12-10 MED ORDER — POTASSIUM CHLORIDE 20 MEQ PO PACK
20.0000 meq | PACK | Freq: Once | ORAL | Status: AC
  Administered 2020-12-10: 20 meq
  Filled 2020-12-10: qty 1

## 2020-12-10 NOTE — Progress Notes (Signed)
NEUROLOGY CONSULTATION PROGRESS NOTE   Date of service: December 10, 2020 Patient Name: [name] [name] MRN:  [id number] DOB:  1966-07-23  Brief HPI  [name] [name] is a 55 y.o. female with hx of HTN p/w myoclonic jerking and unresponsiveness following cardiac arrest with unknown down time, initial rhythm of Vfibb, prolonged ACLS before ROSC. Initial MRI Brain on 1/12 with subtle DWI changes(no ADC correlate thou) with corresponding T2/FLAIR changes(althou not called out by radiologist), repeat MRI Brain with progressive DWI and FLAIR abnormalities within the parietoltemporal cortices, BL striatum, consistent with anoxic brain injury.   Interval Hx   Exam today consistent with prior neuro exams by Dr. Cheral Marker. She is still on precedex and Fnetanyl gtt for agitation and tachycardia.  Vitals   Vitals:   12/10/20 0700 12/10/20 0800 12/10/20 0900 12/10/20 1000  BP: (!) 89/51 (!) 168/92 (!) 96/54 (!) 102/55  Pulse: 63 (!) 130 72 65  Resp: 15 (!) 26 18 16   Temp: (!) 101.48 F (38.6 C) (!) 100.4 F (38 C) (!) 100.58 F (38.1 C) (!) 101.12 F (38.4 C)  TempSrc:      SpO2: 97% 98% 97% 97%  Weight:      Height:         Body mass index is 39.3 kg/m.  Physical Exam   General: Intubated, in no acute distress. HENT: Normal oropharynx and mucosa, some secretions around ETT. Normal external appearance of ears and nose. Neck: Supple, no pain or tenderness CV: No JVD. No peripheral edema. Pulmonary: Symmetric Chest rise. Normal respiratory effort. Breathing over Vent. Abdomen: Soft to touch, non-tender. Ext: No cyanosis, edema, or deformity Skin: No rash. Normal palpation of skin.  Musculoskeletal: Normal digits and nails by inspection. No clubbing.  Neurologic Examination on Precedex and Fentanyl  Mental status/Cognition: Partially opens eyes to loud voice and clap. Does not track or regard my face. Does not look towards voice. Does not blink to threat. No speech, no mouthing of words.  Grimaces to nares stimulation but does not move her head or her arms or legs. Brainstem reflexes: - Corneals + BL - Pupils: 107mm, equal round and reactive to light. - cough: intact - gag: intact - Dolls eye: normal/positive(eyes remain fixated as the head moves).  Motor/Sensory: - flaccid tone in all extremities, no spontaneous movement noted, no response to noxious stimuli in any of the extremities.  Labs   Basic Metabolic Panel:  Lab Results  Component Value Date   NA 147 (H) 12/10/2020   K 3.6 12/10/2020   CO2 25 12/10/2020   GLUCOSE 154 (H) 12/10/2020   BUN 22 (H) 12/10/2020   CREATININE 0.66 12/10/2020   CALCIUM 8.2 (L) 12/10/2020   GFRNONAA >60 12/10/2020   GFRAA >60 10/24/2017   HbA1c:  Lab Results  Component Value Date   HGBA1C 5.9 (H) 12/02/2020   LDL: No results found for: [facility] Urine Drug Screen:     Component Value Date/Time   LABOPIA NONE DETECTED 12/02/2020 0723   COCAINSCRNUR NONE DETECTED 12/02/2020 0723   LABBENZ NONE DETECTED 12/02/2020 0723   AMPHETMU NONE DETECTED 12/02/2020 [place] DETECTED 12/02/2020 0723   LABBARB NONE DETECTED 12/02/2020 0723    Alcohol Level No results found for: ETH No results found for: PHENYTOIN, ZONISAMIDE, LAMOTRIGINE, LEVETIRACETA No results found for: PHENYTOIN, PHENOBARB, VALPROATE, CBMZ  Imaging and Diagnostic studies   MRI Brain without contrast 12/04/20: Personally reviewed and read as normal MRI Brain by radiology. However does seem  to have subtle DWI changes(?shine through) with no clear ADC correlate and subtle diffuse T2/FLAIR changes. MRI Brain without contrast 12/07/20: 1. Progressive DWI and FLAIR abnormality within the parietotemporal cortices and the bilateral corpus striatum, consistent with anoxic brain injury. 2. No hemorrhage or mass effect.  Impression   [name] [name] is a 55 y.o. female with hx of HTN p/w myoclonic jerking and unresponsiveness following cardiac arrest with unknown  down time, initial rhythm of Vfibb, prolonged ACLS before ROSC, now s/p hypothermia protocol. MRI Brain consistent with anoxic injury. 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 Within the limitations of having sedating agents on board, she is not showing signs of awareness of self or environment, no signs of sustained reproducible, purposeful or voluntary behavioral response to auditory, tactile or noxious stimuli, no evidence of language comprehension or expression.  Recommendations  - Continue supportive care and wean off sedation. There is potential for improvement in exam with weaning off sedation. - Will considr repeat EEG after sedation is weaned off to assess for improvement. ______________________________________________________________________   Thank you for the opportunity to take part in the care of this patient. If you have any further questions, please contact the neurology consultation attending.  Signed,  [place] Pager Number [phone]

## 2020-12-10 NOTE — Progress Notes (Signed)
NAME:  [name] [name], MRN:  [id number], DOB:  1966/06/01, LOS: 8 ADMISSION DATE:  12/02/2020, CONSULTATION DATE:  12/02/2020 REFERRING MD:  Jodell Cipro, CHIEF COMPLAINT:  Cardiac arrest  Brief History:  [name] admitted to ICU after suffering outside of hospital V-fib arrest w/unknown downtime. Underwent hypothermia protocol. In addition, had severe acidosis 2/2 DKA  1/10 admitted to ICU for cardiac arrest, started hypothermia protocol 1/11 hypothermia protocol +posturing 1/12 signs of brain damage, needs NEURO consultation 1/13 failed SAT/SBT, unable to follow commands 1/14- for SBT today, electrolytes are improved. Encephalopathic working on mental status.  12/07/20- Patient remains crtically ill and is with interval worsening MRI brain, I met with husband to briefly review.  Discussed case with Neurologist today and there is plan for meeting with family 12/10/20- No change in mental status. Remains on Precedex and Fentanyl due to severe tachycardia with eye opening when stopped. Ongoing GOC discussion.  Past Medical History:  Hypertension  Significant Hospital Events:  ETT 1/10>>   Consults:  Neurology Palliative Care  Procedures:  ETT 1/10 CVL 1/11  Significant Diagnostic Tests:  12/07/2020 MRI Brain 1. Progressive DWI and FLAIR abnormality within the parietotemporal cortices and the bilateral corpus striatum, consistent with anoxic brain injury. 2. No hemorrhage or mass effect.  Echo 12/03/2020 1. Left ventricular ejection fraction, by estimation, is 60 to 65%. The  left ventricle has normal function. The left ventricle has no regional  wall motion abnormalities. There is mild left ventricular hypertrophy.  Left ventricular diastolic parameters  were normal.  2. Right ventricular systolic function is normal. The right ventricular  size is normal. Tricuspid regurgitation signal is inadequate for assessing  PA pressure.  3. A small pericardial effusion is present. The  pericardial effusion is  posterior and lateral to the left ventricle.  4. The mitral valve is grossly normal. No evidence of mitral valve  regurgitation. No evidence of mitral stenosis.  5. The aortic valve is tricuspid. Aortic valve regurgitation is not  visualized. No aortic stenosis is present.  6. The inferior vena cava is normal in size with greater than 50%  respiratory variability, suggesting right atrial pressure of 3 mmHg.  EEG 12/04/2020 This study is suggestive of severe diffuse encephalopathy, nonspecific etiology. No seizures or epileptiform discharges were seen throughout the recording  Micro Data:  12/02/20 MRSA Screen Negative 12/02/20 Bld Cx negative 12/10/20 Bld Cx >> 12/10/20 Tracheal Aspirate>> 12/10/20 Urinalysis>>  Antimicrobials:  Ceftriaxone 1/10 - 1/12  Interim History / Subjective:  No acute events overnight T-max 38.4 I&Os: -504   Objective   Blood pressure (!) 102/55, pulse 65, temperature (!) 101.12 F (38.4 C), resp. rate 16, height 5' 0.98" (1.549 m), weight 94.3 kg, SpO2 97 %. CVP:  [10 mmHg-18 mmHg] 13 mmHg  Vent Mode: PRVC FiO2 (%):  [28 %] 28 % Set Rate:  [15 bmp] 15 bmp Vt Set:  [500 mL] 500 mL PEEP:  [5 cmH20] 5 cmH20 Plateau Pressure:  [17 cmH20] 17 cmH20   Intake/Output Summary (Last 24 hours) at 12/10/2020 1027 Last data filed at 12/10/2020 1000 Gross per 24 hour  Intake 3360.07 ml  Output 3775 ml  Net -414.93 ml   Filed Weights   12/06/20 0435 12/07/20 0455 12/08/20 0500  Weight: 94.3 kg 94.4 kg 94.3 kg    Examination: General: Intubated, moderate distress HENT: [place]/AT, ET tube in place, sclera anicteric Lungs: Course respirations, tachypneic Cardiovascular: tachycardic, s1s2, no murmur Abdomen: soft, non-distended, BS+ Extremities: warm, no edema  Neuro: Eyes open, no blinking to threat, no response to pain. +Brain stem reflexes.  GU: foley in place  Resolved Hospital Problem list   Acute Kidney Injury  Assessment &  Plan:   Severe Anoxic Brain Injury 2/2 Cardiac arrest and prolonged down time - Neurology Following - Remains tachycardic and tachypneic with sedation wean - Neurology and Palliative care following. According to palliative care note 1/13, husband would like all aggressive measures. - Ongoing GOC today  Cardiac Arrest - S/P hypothermia protocol - Echo with normal EF and no wall motion abnormalities - Continue telemetry monitoring  Acute Hypoxemic Respiratory Failure 2/2 above - Requiring minimal vent settings at this time - Mental status precludes extubation - VAP Bundle  Acute blood loss anemia - Hgb 6.9 on 1/17 down from 7.7, received 1 unit PRBC - Hgb stable at 8.6 - Etiology unclear, per MEDICAL RECORD NUMBERSome vaginal bleeding had been noted, but not a concerning amount - Continue to trend CBC  Hypernatremia - Na 147 - Continue FWF 200cc Q4hr  Fever Leukocytosis - Tmax 38.4 - WBC 14, which has been increasing over the past couple days - Last cultured 1/10 - Will send UA, Blood cultures, Sputum today  Best practice (evaluated daily)  Diet: TF Pain/Anxiety/Delirium protocol (if indicated): Precedex, Fentanyl, PRN versed VAP protocol (if indicated): Ordered DVT prophylaxis: heparin GI prophylaxis: PPI daily Glucose control: SSI Mobility: bed rest Disposition:ICU Code Status: Intubate Only  Labs   CBC: Recent Labs  Lab 12/06/20 0440 12/07/20 0410 12/08/20 0500 12/09/20 0254 12/09/20 1600 12/10/20 0415  WBC 8.1 7.4 10.0 9.3 13.7* 14.3*  NEUTROABS 5.5 5.6 6.9 6.6  --  10.8*  HGB 7.1* 7.5* 7.7* 6.9* 8.3* 8.6*  HCT 24.9* 24.7* 26.3* 23.1* 27.5* 28.4*  MCV 84.4 80.5 81.9 82.8 81.4 81.1  PLT 180 188 184 187 204 638    Basic Metabolic Panel: Recent Labs  Lab 12/06/20 0440 12/07/20 0410 12/08/20 0500 12/09/20 0254 12/10/20 0415  NA 147* 147* 147* 150* 147*  K 4.3 3.7 3.4* 3.9 3.6  CL 115* 113* 113* 115* 111  CO2 26 26 24 26 25   GLUCOSE 136* 162* 197*  137* 154*  BUN 24* 27* 33* 34* 22*  CREATININE 0.75 0.72 0.80 0.69 0.66  CALCIUM 8.6* 8.4* 8.4* 8.3* 8.2*  MG 2.2 2.1 2.1 2.1 2.0  PHOS 3.8 3.6 3.7 4.2 3.9   GFR: Estimated Creatinine Clearance: 84.3 mL/min (by C-G formula based on SCr of 0.66 mg/dL). Recent Labs  Lab 12/08/20 0500 12/09/20 0254 12/09/20 1600 12/10/20 0415  WBC 10.0 9.3 13.7* 14.3*    Liver Function Tests: Recent Labs  Lab 12/04/20 0435 12/10/20 0415  AST 72*  --   ALT 89*  --   ALKPHOS 59  --   BILITOT 0.5  --   PROT 7.1  --   ALBUMIN 2.9* 2.3*   No results for input(s): LIPASE, AMYLASE in the last 168 hours. No results for input(s): AMMONIA in the last 168 hours.  ABG    Component Value Date/Time   PHART 7.35 12/02/2020 0933   PCO2ART 47 12/02/2020 0933   PO2ART 70 (L) 12/02/2020 0933   HCO3 25.9 12/02/2020 0933   O2SAT 92.9 12/02/2020 0933     Coagulation Profile: No results for input(s): INR, PROTIME in the last 168 hours.  Cardiac Enzymes: No results for input(s): CKTOTAL, CKMB, CKMBINDEX, TROPONINI in the last 168 hours.  HbA1C: Hgb A1c MFr Bld  Date/Time Value Ref Range Status  12/02/2020 09:36 AM 5.9 (H) 4.8 - 5.6 % Final    Comment:    (NOTE) Pre diabetes:          5.7%-6.4%  Diabetes:              >6.4%  Glycemic control for   <7.0% adults with diabetes     CBG: Recent Labs  Lab 12/09/20 1656 12/09/20 1912 12/09/20 2311 12/10/20 0304 12/10/20 0725  GLUCAP 114* 148* 139* 139* 139*      Critical care time: 53 minutes

## 2020-12-10 NOTE — Progress Notes (Signed)
I spoke with the patients husband this afternoon and discussed the patients condition and plan of care moving forward. The patients husband demonstrated understanding of the gravity of the patients condition and concern for patient to remain in a vegitative state given anoxic brain injury. He stated he despite knowing the worst outcome he wants to give her every chance and would like to pursue trach and peg placement. He would like to wait until Day 10, which is Thursday, to pursue this route.    Tonye Royalty ACNP-BC

## 2020-12-10 NOTE — Plan of Care (Signed)
  Problem: Activity: Goal: Ability to tolerate increased activity will improve Outcome: Not Progressing   Problem: Respiratory: Goal: Ability to maintain a clear airway and adequate ventilation will improve Outcome: Not Progressing   Problem: Role Relationship: Goal: Method of communication will improve Outcome: Not Progressing   Problem: Health Behavior/Discharge Planning: Goal: Ability to manage health-related needs will improve Outcome: Not Progressing   Problem: Clinical Measurements: Goal: Diagnostic test results will improve Outcome: Not Progressing Goal: Respiratory complications will improve Outcome: Not Progressing

## 2020-12-10 NOTE — Progress Notes (Signed)
[place] for Electrolyte Monitoring and Replacement   Recent Labs: Potassium (mmol/L)  Date Value  12/10/2020 3.6  02/20/2013 3.4 (L)   Magnesium (mg/dL)  Date Value  12/10/2020 2.0   Calcium (mg/dL)  Date Value  12/10/2020 8.2 (L)   Calcium, Total (mg/dL)  Date Value  02/20/2013 8.7   Albumin (g/dL)  Date Value  12/10/2020 2.3 (L)  02/20/2013 3.2 (L)   Phosphorus (mg/dL)  Date Value  12/10/2020 3.9   Sodium (mmol/L)  Date Value  12/10/2020 147 (H)  02/20/2013 137     Assessment: 55 year old female presented to the ED via EMS post witnessed cardiac arrest. ROSC achieved following 3 defibrillations and one round of epinephrine. Patient found to have significant hypokalemia on arrival. Given witnessed arrest, patient Code ICE. Pharmacy to manage electrolytes.     Goal of Therapy:  Electrolytes WNL, K ~ 4 and Mg ~ 2 given cardiac events  Plan:  Will give potassium 20 mEq per tube x 1 given h/o significant hypokalemia. Continue to follow along.  Tawnya Crook, PharmD 12/10/2020 12:07 PM

## 2020-12-11 ENCOUNTER — Inpatient Hospital Stay: Payer: Self-pay

## 2020-12-11 DIAGNOSIS — Z978 Presence of other specified devices: Secondary | ICD-10-CM

## 2020-12-11 DIAGNOSIS — J9600 Acute respiratory failure, unspecified whether with hypoxia or hypercapnia: Secondary | ICD-10-CM

## 2020-12-11 LAB — RENAL FUNCTION PANEL
Albumin: 2.1 g/dL — ABNORMAL LOW (ref 3.5–5.0)
Anion gap: 9 (ref 5–15)
BUN: 28 mg/dL — ABNORMAL HIGH (ref 6–20)
CO2: 27 mmol/L (ref 22–32)
Calcium: 8.1 mg/dL — ABNORMAL LOW (ref 8.9–10.3)
Chloride: 107 mmol/L (ref 98–111)
Creatinine, Ser: 0.73 mg/dL (ref 0.44–1.00)
GFR, Estimated: >60 mL/min (ref >60)
Glucose, Bld: 173 mg/dL — ABNORMAL HIGH (ref 70–99)
Phosphorus: 3.7 mg/dL (ref 2.5–4.6)
Potassium: 3.5 mmol/L (ref 3.5–5.1)
Sodium: 143 mmol/L (ref 135–145)

## 2020-12-11 LAB — CBC WITH DIFFERENTIAL/PLATELET
Abs Immature Granulocytes: 0.06 10*3/uL (ref 0.00–0.07)
Basophils Absolute: 0 10*3/uL (ref 0.0–0.1)
Basophils Relative: 0 %
Eosinophils Absolute: 0.4 10*3/uL (ref 0.0–0.5)
Eosinophils Relative: 3 %
HCT: 24 % — ABNORMAL LOW (ref 36.0–46.0)
Hemoglobin: 7.5 g/dL — ABNORMAL LOW (ref 12.0–15.0)
Immature Granulocytes: 1 %
Lymphocytes Relative: 8 %
Lymphs Abs: 1.1 10*3/uL (ref 0.7–4.0)
MCH: 25.8 pg — ABNORMAL LOW (ref 26.0–34.0)
MCHC: 31.3 g/dL (ref 30.0–36.0)
MCV: 82.5 fL (ref 80.0–100.0)
Monocytes Absolute: 0.9 10*3/uL (ref 0.1–1.0)
Monocytes Relative: 7 %
Neutro Abs: 10.4 10*3/uL — ABNORMAL HIGH (ref 1.7–7.7)
Neutrophils Relative %: 81 %
Platelets: 202 10*3/uL (ref 150–400)
RBC: 2.91 MIL/uL — ABNORMAL LOW (ref 3.87–5.11)
RDW: 18.9 % — ABNORMAL HIGH (ref 11.5–15.5)
WBC: 12.8 10*3/uL — ABNORMAL HIGH (ref 4.0–10.5)
nRBC: 0 % (ref 0.0–0.2)

## 2020-12-11 LAB — TROPONIN I (HIGH SENSITIVITY)
Troponin I (High Sensitivity): 60 ng/L — ABNORMAL HIGH (ref <18)
Troponin I (High Sensitivity): 115 ng/L (ref <18)

## 2020-12-11 LAB — GLUCOSE, CAPILLARY
Glucose-Capillary: 145 mg/dL — ABNORMAL HIGH (ref 70–99)
Glucose-Capillary: 161 mg/dL — ABNORMAL HIGH (ref 70–99)
Glucose-Capillary: 177 mg/dL — ABNORMAL HIGH (ref 70–99)
Glucose-Capillary: 161 mg/dL — ABNORMAL HIGH (ref 70–99)
Glucose-Capillary: 170 mg/dL — ABNORMAL HIGH (ref 70–99)
Glucose-Capillary: 149 mg/dL — ABNORMAL HIGH (ref 70–99)

## 2020-12-11 LAB — TRIGLYCERIDES: Triglycerides: 78 mg/dL (ref <150)

## 2020-12-11 LAB — MAGNESIUM: Magnesium: 2.1 mg/dL (ref 1.7–2.4)

## 2020-12-11 IMAGING — DX DG CHEST 1V PORT
1 series · 1 of 1 positions shown · non-contrast
Comparison: [DATE]

CLINICAL DATA: Pneumonia. Hypertension.

EXAM:
PORTABLE CHEST 1 VIEW

[chest ap]
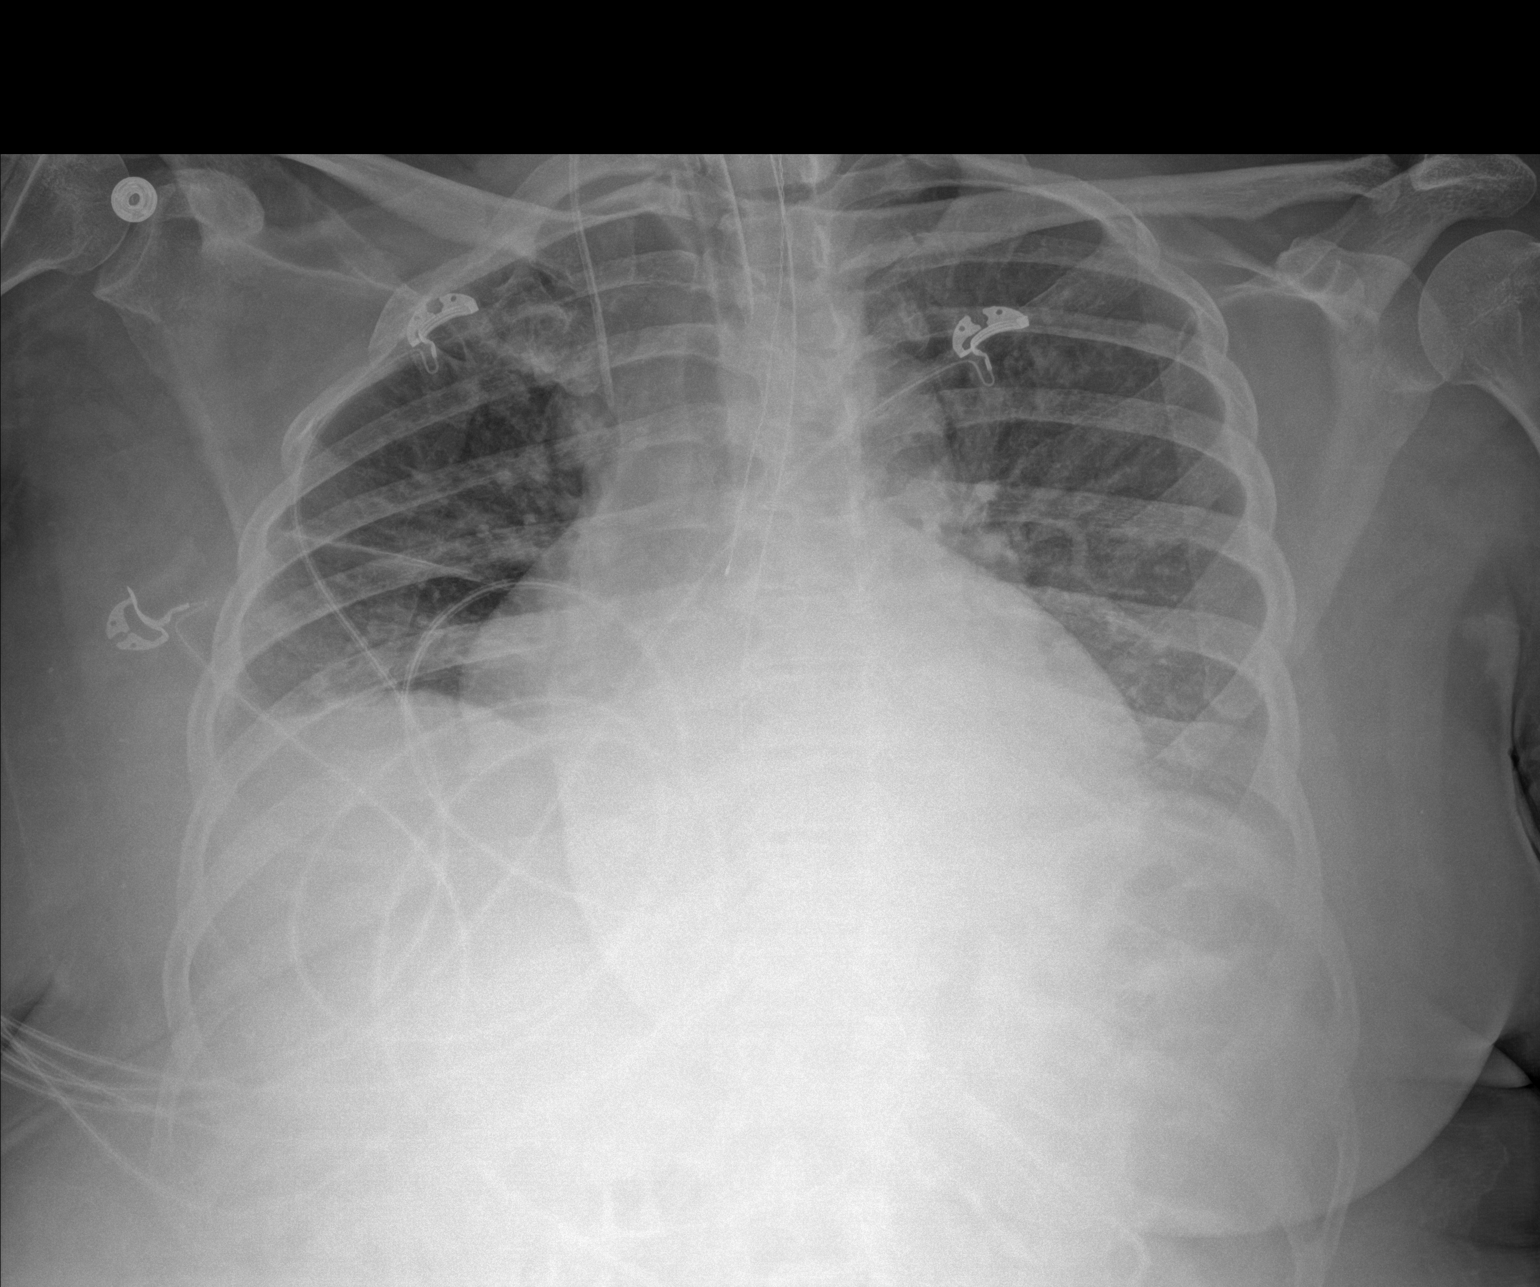

[1 of 1 positions shown; findings below may reference images not displayed]

FINDINGS: Endotracheal tube tip is 3.7 cm above the carina. Nasogastric tube
extends into the stomach. Soft Jewel temperature probe noted. Right
IJ line tip: SVC.

Low lung volumes are present, causing crowding of the pulmonary
vasculature. The patient is rotated to the right on today's
radiograph, reducing diagnostic sensitivity and specificity. Mildly
improved aeration along both hemidiaphragms although some indistinct
basilar airspace opacity persists.

There is stable enlargement of the cardiopericardial silhouette with
indistinctness of the pulmonary vasculature which is nonspecific but
could indicate pulmonary venous hypertension.

Potential 1.2 cm left upper lobe nodule projecting over the left
posterior fourth rib.
IMPRESSION: 1. Potential 1.2 cm left upper lobe nodule. Consider chest CT when
feasible for further characterization.
2. Mildly improved aeration along both hemidiaphragms although some
indistinct airspace opacities persists.
3. Stable enlargement of the cardiopericardial silhouette.
4. Low lung volumes.

## 2020-12-11 MED ORDER — PROSOURCE TF PO LIQD
45.0000 mL | Freq: Two times a day (BID) | ORAL | Status: DC
  Filled 2020-12-11: qty 45

## 2020-12-11 MED ORDER — PROSOURCE PLUS PO LIQD: 30.0000 mL | Freq: Two times a day (BID) | ORAL | Status: DC

## 2020-12-11 MED ORDER — HYDROCHLOROTHIAZIDE 10 MG/ML ORAL SUSPENSION
25.0000 mg | Freq: Every day | ORAL | Status: DC
  Filled 2020-12-11: qty 2.5

## 2020-12-11 MED ORDER — HYDROCHLOROTHIAZIDE 10 MG/ML ORAL SUSPENSION
25.0000 mg | Freq: Every day | ORAL | Status: DC
  Administered 2020-12-11: 25 mg via ORAL
  Filled 2020-12-11 (×4): qty 2.5

## 2020-12-11 MED ORDER — FREE WATER
200.0000 mL | Freq: Four times a day (QID) | Status: DC
  Administered 2020-12-11 – 2020-12-12 (×4): 200 mL

## 2020-12-11 MED ORDER — PROPOFOL 1000 MG/100ML IV EMUL
5.0000 ug/kg/min | INTRAVENOUS | Status: DC
  Administered 2020-12-13: 25 ug/kg/min via INTRAVENOUS
  Filled 2020-12-11: qty 100

## 2020-12-11 MED ORDER — POTASSIUM CHLORIDE 20 MEQ PO PACK
20.0000 meq | PACK | Freq: Two times a day (BID) | ORAL | Status: AC
  Administered 2020-12-11 (×2): 20 meq via ORAL
  Filled 2020-12-11 (×2): qty 1

## 2020-12-11 MED ORDER — FUROSEMIDE 10 MG/ML IJ SOLN
40.0000 mg | Freq: Once | INTRAMUSCULAR | Status: AC
  Administered 2020-12-11: 40 mg via INTRAVENOUS
  Filled 2020-12-11: qty 4

## 2020-12-11 MED ORDER — SODIUM CHLORIDE 0.9 % IV SOLN
1.0000 g | INTRAVENOUS | Status: DC
  Administered 2020-12-11 – 2020-12-13 (×3): 1 g via INTRAVENOUS
  Filled 2020-12-11 (×2): qty 10
  Filled 2020-12-11: qty 1

## 2020-12-11 NOTE — Progress Notes (Signed)
Daily Progress Note   Patient Name: [name] [name]       Date: 12/11/2020 DOB: [birth year]  Age: 55 y.o. MRN#: [id number] Attending Physician: Freddi Starr, MD Primary Care Physician: Marinda Elk, MD Admit Date: 12/02/2020  Reason for Consultation/Follow-up: Establishing goals of care  Subjective: Patient remains intubated/sedated. Neurological status remains poor. No s/s of distress or discomfort during visit.   GOC:  Husband, Mr. Shaub at bedside. Mr. Debes shares that him and the family are not giving up faith that Jalyiah will get better. She has a large prayer chain of people praying for her. Mr. Gates shares how fiercly independent Dimple was prior to this event and he is hoping and praying for a miracle. Therapeutic listening and emotional/spiritual support provided.   Discussed plan of care. Mr. Moscoso is appreciative of care team support and daily updates. He confirms decision to pursue trach/peg placement, understanding she will require nursing home level of care following stability. Again, he is keeping her life in the hands of the Lord and not giving up on her. He shares she wouldn't give up on him if this was him in the situation.   Reassured of ongoing support from PMT. PMT contact information given.   Length of Stay: 9  Current Medications: Scheduled Meds:   sodium chloride   Intravenous Once   amLODipine  10 mg Per Tube Daily   chlorhexidine gluconate (MEDLINE KIT)  15 mL Mouth Rinse BID   Chlorhexidine Gluconate Cloth  6 each Topical Daily   feeding supplement (PROSource TF)  45 mL Per Tube BID   feeding supplement (VITAL AF 1.2 CAL)  1,000 mL Per Tube Q24H   free water  200 mL Per Tube Q6H   heparin  5,000 Units Subcutaneous Q8H    hydrochlorothiazide  25 mg Oral Daily   insulin aspart  0-15 Units Subcutaneous Q4H   mouth rinse  15 mL Mouth Rinse 10 times per day   multivitamin with minerals  1 tablet Per Tube Daily   pantoprazole (PROTONIX) IV  40 mg Intravenous QHS   polyethylene glycol  17 g Oral Daily   potassium chloride  20 mEq Oral BID   senna-docusate  1 tablet Oral Daily    Continuous Infusions:  sodium chloride 250 mL (12/08/20 1341)   cefTRIAXone (  ROCEPHIN)  IV 1 g (12/11/20 1036)   dexmedetomidine (PRECEDEX) IV infusion 1.2 mcg/kg/hr (12/11/20 1237)   fentaNYL infusion INTRAVENOUS 350 mcg/hr (12/11/20 1339)   propofol (DIPRIVAN) infusion      PRN Meds: acetaminophen, fentaNYL, hydrALAZINE, midazolam  Physical Exam Vitals and nursing note reviewed.  Constitutional:      Interventions: She is sedated and intubated.  Cardiovascular:     Rate and Rhythm: Normal rate.  Pulmonary:     Effort: No tachypnea, accessory muscle usage or respiratory distress. She is intubated.     Breath sounds: Decreased breath sounds present.     Comments: Intubated/mechanically ventilated Skin:    General: Skin is warm and dry.  Neurological:     Comments: + cough/gag, does not follow commands            Vital Signs: BP (!) 168/81    Pulse (!) 137    Temp (!) 100.76 F (38.2 C)    Resp (!) 23    Ht 5' 0.98" (1.549 m)    Wt 94.3 kg    SpO2 95%    BMI 39.30 kg/m  SpO2: SpO2: 95 % O2 Device: O2 Device: Ventilator O2 Flow Rate:    Intake/output summary:   Intake/Output Summary (Last 24 hours) at 12/11/2020 1427 Last data filed at 12/11/2020 1300 Gross per 24 hour  Intake 1702.13 ml  Output 4050 ml  Net -2347.87 ml   LBM: Last BM Date: 12/10/20 Baseline Weight: Weight: 90 kg Most recent weight: Weight: 94.3 kg       Palliative Assessment/Data: PPS 30%    Flowsheet Rows   Flowsheet Row Most Recent Value  Intake Tab   Referral Department Critical care  Unit at Time of Referral ICU   Palliative Care Primary Diagnosis Pulmonary  Date Notified 12/05/20  Palliative Care Type New Palliative care  Reason for referral Clarify Goals of Care  Date of Admission 12/02/20  Date first seen by Palliative Care 12/05/20  # of days Palliative referral response time 0 Day(s)  # of days IP prior to Palliative referral 3  Clinical Assessment   Psychosocial & Spiritual Assessment   Palliative Care Outcomes   Patient/Family meeting held? No  Palliative Care Outcomes Clarified goals of care, Provided advance care planning, Provided psychosocial or spiritual support, Changed CPR status      Patient Active Problem List   Diagnosis Date Noted   Cardiac arrest [facility]) 12/02/2020    Palliative Care Assessment & Plan   Patient Profile: 55 y.o. female  with past medical history of HTN admitted on 12/02/2020 with v fib arrest. Hypothermia protocol initiated.  Developed myoclonic jerking 1/12. Unable to follow commands. Per neuro, MRI consistent with anoxic brain injury. PMT consulted to discuss [place].   Assessment: Acute cardiac arrest Severe acidosis and DKA on admit Witnessed vfib arrest Severe anoxic brain injury Acute respiratory failure requiring intubation/mechanica ventilation Hypernatremia Fever Leukocytosis ? Pneumonia  Recommendations/Plan: Ongoing full scope treatment per husband wishes. Husband confirms decision to move forward with trach/peg placement. Ongoing intermittent support from PMT.  Chaplain support appreciated. Very spiritual patient and family.    Code Status: Partial: NO CPR   Code Status Orders  (From admission, onward)         Start     Ordered   12/05/20 1326  Limited resuscitation (code)  Continuous       Question Answer Comment  In the event of cardiac or respiratory ARREST: Initiate Code Blue, Call Rapid Response  Yes   In the event of cardiac or respiratory ARREST: Perform CPR No   In the event of cardiac or respiratory ARREST: Perform  Intubation/Mechanical Ventilation Yes   In the event of cardiac or respiratory ARREST: Use NIPPV/BiPAp only if indicated Yes   In the event of cardiac or respiratory ARREST: Administer ACLS medications if indicated Yes   In the event of cardiac or respiratory ARREST: Perform Defibrillation or Cardioversion if indicated Yes      12/05/20 1326        Code Status History    Date Active Date Inactive Code Status Order ID Comments User Context   12/02/2020 0934 12/05/2020 1326 Full Code [id number]  Flora Lipps, MD Inpatient   Advance Care Planning Activity      Prognosis: Poor  Discharge Planning: To Be Determined  Care plan was discussed with RN, husband  Thank you for allowing the Palliative Medicine Team to assist in the care of this patient.   Total Time 25 Prolonged Time Billed   no      Greater than 50%  of this time was spent counseling and coordinating care related to the above assessment and plan.  Ihor Dow, DNP, FNP-C Palliative Medicine Team  Phone: [phone] Fax: [phone]  Please contact Palliative Medicine Team phone at [phone] for questions and concerns.

## 2020-12-11 NOTE — Consult Note (Addendum)
Consultation  Referring Provider:    Dr. Erin Fulling Admit date: 12/02/2020 Consult date: 12/11/2020         Reason for Consultation:  PEG tube placement            HPI:   [name] [name] is a 55 y.o. lady who was admitted due to cardiac arrest and found to have anoxic brain injury. No purposeful movement. We have been consulted for consideration of PEG tube placement. Patient is intubated and sedated. History gathered from husband who is at the bedside. History of c-section prior but no other significant abdominal surgeries. No blood thinners. Patient currently being treated for pneumonia.  Past Medical History:  Diagnosis Date  . Hypertension     History reviewed. No pertinent surgical history.  Family History  Problem Relation Age of Onset  . Hypertension Mother   . Leukemia Mother   . Diabetes Mother   . Healthy Father     Social History   Tobacco Use  . Smoking status: Former Smoker    Quit date: 2007    Years since quitting: 15.0  . Smokeless tobacco: Never Used  Vaping Use  . Vaping Use: Never used  Substance Use Topics  . Alcohol use: No  . Drug use: Never    Prior to Admission medications   Not on File    Current Facility-Administered Medications  Medication Dose Route Frequency Provider Last Rate Last Admin  . 0.9 %  sodium chloride infusion (Manually program via Guardrails IV Fluids)   Intravenous Once Rust-Chester, Britton L, NP      . 0.9 %  sodium chloride infusion  250 mL Intravenous Continuous Darel Hong D, NP 10 mL/hr at 12/08/20 1341 250 mL at 12/08/20 1341  . acetaminophen (TYLENOL) tablet 650 mg  650 mg Per Tube Q8H PRN Rust-Chester, Huel Cote, NP   650 mg at 12/10/20 2155  . amLODipine (NORVASC) tablet 10 mg  10 mg Per Tube Daily Flora Lipps, MD   10 mg at 12/11/20 1030  . cefTRIAXone (ROCEPHIN) 1 g in sodium chloride 0.9 % 100 mL IVPB  1 g Intravenous Q24H Freda [place] B, MD 200 mL/hr at 12/11/20 1036 1 g at 12/11/20 1036  . chlorhexidine  gluconate (MEDLINE KIT) (PERIDEX) 0.12 % solution 15 mL  15 mL Mouth Rinse BID Flora Lipps, MD   15 mL at 12/11/20 0740  . Chlorhexidine Gluconate Cloth 2 % PADS 6 each  6 each Topical Daily Flora Lipps, MD   6 each at 12/11/20 1032  . dexmedetomidine (PRECEDEX) 400 MCG/100ML (4 mcg/mL) infusion  0.4-1.2 mcg/kg/hr Intravenous Titrated Ottie Glazier, MD 28.3 mL/hr at 12/11/20 1237 1.2 mcg/kg/hr at 12/11/20 1237  . feeding supplement (PROSource TF) liquid 45 mL  45 mL Per Tube BID Flora Lipps, MD   45 mL at 12/11/20 1030  . feeding supplement (VITAL AF 1.2 CAL) liquid 1,000 mL  1,000 mL Per Tube Q24H Flora Lipps, MD 40 mL/hr at 12/11/20 1032 1,000 mL at 12/11/20 1032  . fentaNYL (SUBLIMAZE) bolus via infusion 50 mcg  50 mcg Intravenous Q2H PRN Darel Hong D, NP   50 mcg at 12/11/20 1338  . fentaNYL 2588mg in NS 2568m(1040mml) infusion-PREMIX  0-400 mcg/hr Intravenous Continuous KasFlora LippsD 35 mL/hr at 12/11/20 1339 350 mcg/hr at 12/11/20 1339  . free water 200 mL  200 mL Per Tube Q6H DewFreda [facility] MD   200 mL at 12/11/20 1127  . heparin injection 5,000 Units  5,000  Units Subcutaneous Q8H Flora Lipps, MD   5,000 Units at 12/11/20 1330  . hydrALAZINE (APRESOLINE) tablet 25-50 mg  25-50 mg Per Tube Q6H PRN Awilda Bill, NP   25 mg at 12/11/20 1150  . hydrochlorothiazide 10 mg/mL oral suspension 25 mg  25 mg Oral Daily Freda [facility] B, MD      . insulin aspart (novoLOG) injection 0-15 Units  0-15 Units Subcutaneous Q4H Flora Lipps, MD   3 Units at 12/11/20 1156  . MEDLINE mouth rinse  15 mL Mouth Rinse 10 times per day Flora Lipps, MD   15 mL at 12/11/20 1330  . midazolam (VERSED) injection 2 mg  2 mg Intravenous Q1H PRN Rust-Chester, Britton L, NP   2 mg at 12/11/20 1329  . multivitamin with minerals tablet 1 tablet  1 tablet Per Tube Daily Flora Lipps, MD   1 tablet at 12/11/20 1030  . pantoprazole (PROTONIX) injection 40 mg  40 mg Intravenous QHS Flora Lipps, MD   40  mg at 12/10/20 2132  . polyethylene glycol (MIRALAX / GLYCOLAX) packet 17 g  17 g Oral Daily Ottie Glazier, MD   17 g at 12/10/20 0953  . potassium chloride (KLOR-CON) packet 20 mEq  20 mEq Oral BID Freda [facility] B, MD      . propofol (DIPRIVAN) 1000 MG/100ML infusion  5-50 mcg/kg/min Intravenous Titrated Freddi Starr, MD      . senna-docusate (Senokot-S) tablet 1 tablet  1 tablet Oral Daily Ottie Glazier, MD   1 tablet at 12/10/20 0950    Allergies as of 12/02/2020  . (No Known Allergies)     Review of Systems:     Unable to assess due to being intubated/sedated    Physical Exam:  Vital signs in last 24 hours: Temp:  [99.68 F (37.6 C)-100.94 F (38.3 C)] 100.76 F (38.2 C) (01/19 1500) Pulse Rate:  [58-137] 67 (01/19 1500) Resp:  [15-28] 17 (01/19 1400) BP: (88-230)/(53-100) 98/55 (01/19 1500) SpO2:  [89 %-98 %] 96 % (01/19 1500) FiO2 (%):  [28 %] 28 % (01/19 1122) Weight:  [94.3 kg] 94.3 kg (01/19 0500) Last BM Date: 12/10/20 General:   Intubated/sedated Head:  Normocephalic and atraumatic. Eyes:   No icterus Mouth: Moist mucous membranes Neck:  Supple; no masses felt Lungs:  Mechanically ventilated Abdomen:   Obese, soft, nondistended, nontender. No appreciable masses or hepatomegaly. No rebound signs or other peritoneal signs. No significant abdominal scarring. Msk:  No clubbing or cyanosis Neurologic:  No purposeful movement Skin:  Warm, dry, pink without significant lesions or rashes.  LAB RESULTS: Recent Labs    12/09/20 1600 12/10/20 0415 12/11/20 0542  WBC 13.7* 14.3* 12.8*  HGB 8.3* 8.6* 7.5*  HCT 27.5* 28.4* 24.0*  PLT 204 214 202   BMET Recent Labs    12/09/20 0254 12/10/20 0415 12/11/20 0542  NA 150* 147* 143  K 3.9 3.6 3.5  CL 115* 111 107  CO2 26 25 27   GLUCOSE 137* 154* 173*  BUN 34* 22* 28*  CREATININE 0.69 0.66 0.73  CALCIUM 8.3* 8.2* 8.1*   LFT Recent Labs    12/11/20 0542  ALBUMIN 2.1*   PT/INR No results for  input(s): LABPROT, INR in the last 72 hours.  STUDIES: DG Chest Port 1 View  Result Date: 12/11/2020 CLINICAL DATA:  Pneumonia. Hypertension. EXAM: PORTABLE CHEST 1 VIEW COMPARISON:  12/09/2020 FINDINGS: Endotracheal tube tip is 3.7 cm above the carina. Nasogastric tube extends into the stomach. Soft  Jewel temperature probe noted. Right IJ line tip: SVC. Low lung volumes are present, causing crowding of the pulmonary vasculature. The patient is rotated to the right on today's radiograph, reducing diagnostic sensitivity and specificity. Mildly improved aeration along both hemidiaphragms although some indistinct basilar airspace opacity persists. There is stable enlargement of the cardiopericardial silhouette with indistinctness of the pulmonary vasculature which is nonspecific but could indicate pulmonary venous hypertension. Potential 1.2 cm left upper lobe nodule projecting over the left posterior fourth rib. IMPRESSION: 1. Potential 1.2 cm left upper lobe nodule. Consider chest CT when feasible for further characterization. 2. Mildly improved aeration along both hemidiaphragms although some indistinct airspace opacities persists. 3. Stable enlargement of the cardiopericardial silhouette. 4. Low lung volumes. Electronically Signed   By: Van Clines M.D.   On: 12/11/2020 09:21       Impression / Plan:   55 y/o lady with anoxic brain injury due to cardiac arrest for which we have been consulted for PEG tube placement. Timing depends on endo availability. Have discussed with husband risks/benefits of PEG tube placement including perforation requiring surgery, infection, bleeding, and not obtaining an adequate window which could necessitate surgical vs IR placement. Regardless, will re-evaluate tomorrow to determine optimal timing. Please call with any questions or concerns.  Raylene Miyamoto MD, MPH [facility]

## 2020-12-11 NOTE — Progress Notes (Signed)
EKG completed Copy give to MD and placed in chart

## 2020-12-11 NOTE — Consult Note (Signed)
[name] [name], [name] [name] [id number] 03-13-1966 [name] Nearing, MD  Reason for Consult:Tracheostomy Requesting Physician: [name] Starr, MD Consulting Physician: [name] Nearing, MD  HPI: This 55 y.o. year old female was admitted on 12/02/2020 for Cardiac arrest [facility]) [I46.9]. Patient concerning for significant anoxic brain injury and remains critically ill though on minimal vent settings. The care team has requested a tracheostomy and discussed with the family.   Medications:  Current Facility-Administered Medications  Medication Dose Route Frequency Provider Last Rate Last Admin  . 0.9 %  sodium chloride infusion (Manually program via Guardrails IV Fluids)   Intravenous Once Rust-Chester, Britton L, NP      . 0.9 %  sodium chloride infusion  250 mL Intravenous Continuous Darel Hong D, NP 10 mL/hr at 12/08/20 1341 250 mL at 12/08/20 1341  . acetaminophen (TYLENOL) tablet 650 mg  650 mg Per Tube Q8H PRN Rust-Chester, Huel Cote, NP   650 mg at 12/10/20 2155  . amLODipine (NORVASC) tablet 10 mg  10 mg Per Tube Daily Flora Lipps, MD   10 mg at 12/11/20 1030  . cefTRIAXone (ROCEPHIN) 1 g in sodium chloride 0.9 % 100 mL IVPB  1 g Intravenous Q24H Freda Jackson B, MD 200 mL/hr at 12/11/20 1036 1 g at 12/11/20 1036  . chlorhexidine gluconate (MEDLINE KIT) (PERIDEX) 0.12 % solution 15 mL  15 mL Mouth Rinse BID Flora Lipps, MD   15 mL at 12/11/20 0740  . Chlorhexidine Gluconate Cloth 2 % PADS 6 each  6 each Topical Daily Flora Lipps, MD   6 each at 12/11/20 1032  . dexmedetomidine (PRECEDEX) 400 MCG/100ML (4 mcg/mL) infusion  0.4-1.2 mcg/kg/hr Intravenous Titrated Ottie Glazier, MD 28.3 mL/hr at 12/11/20 1604 1.2 mcg/kg/hr at 12/11/20 1604  . feeding supplement (PROSource TF) liquid 45 mL  45 mL Per Tube BID Flora Lipps, MD   45 mL at 12/11/20 1030  . feeding supplement (VITAL AF 1.2 CAL) liquid 1,000 mL  1,000 mL Per Tube Q24H Flora Lipps, MD 40 mL/hr at 12/11/20 1241 1,000 mL at 12/11/20 1241   . fentaNYL (SUBLIMAZE) bolus via infusion 50 mcg  50 mcg Intravenous Q2H PRN Darel Hong D, NP   50 mcg at 12/11/20 1338  . fentaNYL 2570mg in NS 2562m(1044mml) infusion-PREMIX  0-400 mcg/hr Intravenous Continuous KasFlora LippsD 30 mL/hr at 12/11/20 1853 300 mcg/hr at 12/11/20 1853  . free water 200 mL  200 mL Per Tube Q6H DewFreda Jackson MD   200 mL at 12/11/20 1849  . heparin injection 5,000 Units  5,000 Units Subcutaneous Q8H KasFlora LippsD   5,000 Units at 12/11/20 1330  . hydrALAZINE (APRESOLINE) tablet 25-50 mg  25-50 mg Per Tube Q6H PRN BlaAwilda BillP   25 mg at 12/11/20 1150  . hydrochlorothiazide 10 mg/mL oral suspension 25 mg  25 mg Oral Daily DewFreddi StarrD   25 mg at 12/11/20 1500  . insulin aspart (novoLOG) injection 0-15 Units  0-15 Units Subcutaneous Q4H KasFlora LippsD   3 Units at 12/11/20 1547  . MEDLINE mouth rinse  15 mL Mouth Rinse 10 times per day KasFlora LippsD   15 mL at 12/11/20 1849  . midazolam (VERSED) injection 2 mg  2 mg Intravenous Q1H PRN Rust-Chester, Britton L, NP   2 mg at 12/11/20 1329  . multivitamin with minerals tablet 1 tablet  1 tablet Per Tube Daily KasFlora LippsD   1 tablet at 12/11/20 1030  .  pantoprazole (PROTONIX) injection 40 mg  40 mg Intravenous QHS Flora Lipps, MD   40 mg at 12/10/20 2132  . polyethylene glycol (MIRALAX / GLYCOLAX) packet 17 g  17 g Oral Daily Ottie Glazier, MD   17 g at 12/10/20 0953  . potassium chloride (KLOR-CON) packet 20 mEq  20 mEq Oral BID [name] Starr, MD   20 mEq at 12/11/20 1500  . propofol (DIPRIVAN) 1000 MG/100ML infusion  5-50 mcg/kg/min Intravenous Titrated [name] Starr, MD      . senna-docusate (Senokot-S) tablet 1 tablet  1 tablet Oral Daily Ottie Glazier, MD   1 tablet at 12/10/20 0950  .  No medications prior to admission.    Allergies: No Known Allergies  PMH:  Past Medical History:  Diagnosis Date  . Hypertension     Fam Hx:  Family History  Problem  Relation Age of Onset  . Hypertension Mother   . Leukemia Mother   . Diabetes Mother   . Healthy Father     Soc Hx:  Social History   Socioeconomic History  . Marital status: Married    Spouse name: Not on file  . Number of children: Not on file  . Years of education: Not on file  . Highest education level: Not on file  Occupational History  . Not on file  Tobacco Use  . Smoking status: Former Smoker    Quit date: 2007    Years since quitting: 15.0  . Smokeless tobacco: Never Used  Vaping Use  . Vaping Use: Never used  Substance and Sexual Activity  . Alcohol use: No  . Drug use: Never  . Sexual activity: Not on file  Other Topics Concern  . Not on file  Social History Narrative  . Not on file   Social Determinants of Health   Financial Resource Strain: Not on file  Food Insecurity: Not on file  Transportation Needs: Not on file  Physical Activity: Not on file  Stress: Not on file  Social Connections: Not on file  Intimate Partner Violence: Not on file    PSH: History reviewed. No pertinent surgical history.. Procedures since admission: No admission procedures for hospital encounter.  ROS: Review of systems unable to obtain (intubated).   PHYSICAL EXAM  Vitals: Blood pressure (!) 142/70, pulse 98, temperature (!) 101.48 F (38.6 C), resp. rate (!) 25, height 5' 0.98" (1.549 m), weight 94.3 kg, SpO2 93 %.. General: Well-developed, Well-nourished on vent and sedated Mood: Unable to assess. Patient on vent and sedated Orientation: Patient on vent and sedated. Vocal Quality: Unable to assess. Patient intubated, on vent and sedated Head and Face: NCAT. No facial asymmetry. No visible skin lesions. No significant facial scars. No tenderness with sinus percussion. Facial strength normal and symmetric. Ears: External ears with normal landmarks, no lesions. External auditory canals free of infection, cerumen impaction or lesions. Tympanic membranes intact with good  landmarks and normal mobility on pneumatic otoscopy. No middle ear effusion. Hearing: Unable to assess. Patient on vent and sedated Nose: External nose normal with midline dorsum and no lesions or deformity. Nasal Cavity reveals essentially midline septum with normal inferior turbinates. No significant mucosal congestion or erythema. Nasal secretions are minimal and clear. No polyps seen on anterior rhinoscopy. Oral Cavity/ Oropharynx: Lips are normal with no lesions. Teeth no frank dental caries visualized and gingiva appear normal. Visualized tongue, floor of mouth and pharynx appear normal, though exam is limited due to presence of endotracheal tube. Indirect  Laryngoscopy/Nasopharyngoscopy: Visualization of the larynx, hypopharynx and nasopharynx is not possible in this setting with routine examination. Neck: Supple and symmetric with no palpable masses, tenderness or crepitance. The trachea is midline. Thyroid gland is soft,  symmetric with no masses, though maybe a little enlargement. Parotid and submandibular glands are soft, nontender and symmetric, without masses. Lymphatic: Cervical lymph nodes are without palpable lymphadenopathy or tenderness. Respiratory: Patient on ventilator for respiratory support Cardiovascular: Carotid pulse shows regular rate and rhythm Neurologic: Unable to assess. Patient on vent and sedated Eyes: Unable to assess. Patient on vent and sedated  MEDICAL DECISION MAKING: Data Review:  Results for orders placed or performed during the hospital encounter of 12/02/20 (from the past 48 hour(s))  Glucose, capillary     Status: Abnormal   Collection Time: 12/09/20  7:12 PM  Result Value Ref Range   Glucose-Capillary 148 (H) 70 - 99 mg/dL    Comment: Glucose reference range applies only to samples taken after fasting for at least 8 hours.  Glucose, capillary     Status: Abnormal   Collection Time: 12/09/20 11:11 PM  Result Value Ref Range   Glucose-Capillary 139 (H)  70 - 99 mg/dL    Comment: Glucose reference range applies only to samples taken after fasting for at least 8 hours.  Glucose, capillary     Status: Abnormal   Collection Time: 12/10/20  3:04 AM  Result Value Ref Range   Glucose-Capillary 139 (H) 70 - 99 mg/dL    Comment: Glucose reference range applies only to samples taken after fasting for at least 8 hours.  CBC with Differential/Platelet     Status: Abnormal   Collection Time: 12/10/20  4:15 AM  Result Value Ref Range   WBC 14.3 (H) 4.0 - 10.5 K/uL   RBC 3.50 (L) 3.87 - 5.11 MIL/uL   Hemoglobin 8.6 (L) 12.0 - 15.0 g/dL   HCT 28.4 (L) 36.0 - 46.0 %   MCV 81.1 80.0 - 100.0 fL   MCH 24.6 (L) 26.0 - 34.0 pg   MCHC 30.3 30.0 - 36.0 g/dL   RDW 18.6 (H) 11.5 - 15.5 %   Platelets 214 150 - 400 K/uL   nRBC 0.1 0.0 - 0.2 %   Neutrophils Relative % 76 %   Neutro Abs 10.8 (H) 1.7 - 7.7 K/uL   Lymphocytes Relative 13 %   Lymphs Abs 1.9 0.7 - 4.0 K/uL   Monocytes Relative 8 %   Monocytes Absolute 1.2 (H) 0.1 - 1.0 K/uL   Eosinophils Relative 2 %   Eosinophils Absolute 0.3 0.0 - 0.5 K/uL   Basophils Relative 0 %   Basophils Absolute 0.1 0.0 - 0.1 K/uL   Immature Granulocytes 1 %   Abs Immature Granulocytes 0.10 (H) 0.00 - 0.07 K/uL    Comment: Performed at [facility], [address] [place], [place] [address]  Magnesium     Status: None   Collection Time: 12/10/20  4:15 AM  Result Value Ref Range   Magnesium 2.0 1.7 - 2.4 mg/dL    Comment: Performed at [facility], [address] [place], [place] [address]  Renal function panel     Status: Abnormal   Collection Time: 12/10/20  4:15 AM  Result Value Ref Range   Sodium 147 (H) 135 - 145 mmol/L   Potassium 3.6 3.5 - 5.1 mmol/L   Chloride 111 98 - 111 mmol/L   CO2 25 22 - 32 mmol/L   Glucose, Bld 154 (  H) 70 - 99 mg/dL    Comment: Glucose reference range applies only to samples taken after fasting for at least 8 hours.   BUN 22 (H) 6 - 20 mg/dL   Creatinine,  Ser 0.66 0.44 - 1.00 mg/dL   Calcium 8.2 (L) 8.9 - 10.3 mg/dL   Phosphorus 3.9 2.5 - 4.6 mg/dL   Albumin 2.3 (L) 3.5 - 5.0 g/dL   GFR, Estimated >60 >60 mL/min    Comment: (NOTE) Calculated using the CKD-EPI Creatinine Equation (2021)    Anion gap 11 5 - 15    Comment: Performed at [facility], [address] [place], [place] [address]  Glucose, capillary     Status: Abnormal   Collection Time: 12/10/20  7:25 AM  Result Value Ref Range   Glucose-Capillary 139 (H) 70 - 99 mg/dL    Comment: Glucose reference range applies only to samples taken after fasting for at least 8 hours.  Glucose, capillary     Status: Abnormal   Collection Time: 12/10/20 11:19 AM  Result Value Ref Range   Glucose-Capillary 152 (H) 70 - 99 mg/dL    Comment: Glucose reference range applies only to samples taken after fasting for at least 8 hours.  Culture, blood (routine x 2)     Status: None (Preliminary result)   Collection Time: 12/10/20 11:28 AM   Specimen: BLOOD  Result Value Ref Range   Specimen Description BLOOD LINE    Special Requests      BOTTLES DRAWN AEROBIC AND ANAEROBIC Blood Culture adequate volume   Culture      NO GROWTH < 24 HOURS Performed at [facility], [address], [place], [place] [address]    Report Status PENDING   Culture, blood (routine x 2)     Status: None (Preliminary result)   Collection Time: 12/10/20 11:38 AM   Specimen: BLOOD RIGHT HAND  Result Value Ref Range   Specimen Description BLOOD RIGHT HAND    Special Requests      BOTTLES DRAWN AEROBIC AND ANAEROBIC Blood Culture adequate volume   Culture      NO GROWTH < 24 HOURS Performed at [facility], [address], [place], [place] [address]    Report Status PENDING   Culture, respiratory (non-expectorated)     Status: None (Preliminary result)   Collection Time: 12/10/20  2:12 PM   Specimen: Tracheal Aspirate; Respiratory  Result Value Ref Range   Specimen Description       TRACHEAL ASPIRATE Performed at [facility], [address] [place], [place] [address]    Special Requests      NONE Performed at [facility], [place], [place] [address]    Gram Stain      RARE WBC PRESENT, PREDOMINANTLY PMN ABUNDANT GRAM POSITIVE COCCI FEW GRAM POSITIVE RODS RARE GRAM NEGATIVE RODS    Culture      TOO YOUNG TO READ Performed at [facility], [place] [address], [place], [place] [address]    Report Status PENDING   Urinalysis, Routine w reflex microscopic Urine, Catheterized     Status: Abnormal   Collection Time: 12/10/20  2:30 PM  Result Value Ref Range   Color, Urine YELLOW (A) YELLOW   APPearance HAZY (A) CLEAR   Specific Gravity, Urine 1.026 1.005 - 1.030   pH 5.0 5.0 - 8.0   Glucose, UA NEGATIVE NEGATIVE mg/dL   Hgb urine dipstick NEGATIVE NEGATIVE   Bilirubin  Urine NEGATIVE NEGATIVE   Ketones, ur NEGATIVE NEGATIVE mg/dL   Protein, ur 30 (A) NEGATIVE mg/dL   Nitrite NEGATIVE NEGATIVE   Leukocytes,Ua TRACE (A) NEGATIVE   RBC / HPF 0-5 0 - 5 RBC/hpf   WBC, UA 0-5 0 - 5 WBC/hpf   Bacteria, UA NONE SEEN NONE SEEN   Squamous Epithelial / LPF 0-5 0 - 5   Mucus PRESENT    Hyaline Casts, UA PRESENT    Non Squamous Epithelial PRESENT (A) NONE SEEN    Comment: Performed at [facility], [address] [place], [place] [address]  Glucose, capillary     Status: Abnormal   Collection Time: 12/10/20  4:38 PM  Result Value Ref Range   Glucose-Capillary 161 (H) 70 - 99 mg/dL    Comment: Glucose reference range applies only to samples taken after fasting for at least 8 hours.  Glucose, capillary     Status: Abnormal   Collection Time: 12/10/20  7:32 PM  Result Value Ref Range   Glucose-Capillary 146 (H) 70 - 99 mg/dL    Comment: Glucose reference range applies only to samples taken after fasting for at least 8 hours.  Glucose, capillary     Status: Abnormal   Collection Time: 12/10/20 11:04 PM  Result Value  Ref Range   Glucose-Capillary 138 (H) 70 - 99 mg/dL    Comment: Glucose reference range applies only to samples taken after fasting for at least 8 hours.  Glucose, capillary     Status: Abnormal   Collection Time: 12/11/20  3:09 AM  Result Value Ref Range   Glucose-Capillary 145 (H) 70 - 99 mg/dL    Comment: Glucose reference range applies only to samples taken after fasting for at least 8 hours.  CBC with Differential/Platelet     Status: Abnormal   Collection Time: 12/11/20  5:42 AM  Result Value Ref Range   WBC 12.8 (H) 4.0 - 10.5 K/uL   RBC 2.91 (L) 3.87 - 5.11 MIL/uL   Hemoglobin 7.5 (L) 12.0 - 15.0 g/dL   HCT 24.0 (L) 36.0 - 46.0 %   MCV 82.5 80.0 - 100.0 fL   MCH 25.8 (L) 26.0 - 34.0 pg   MCHC 31.3 30.0 - 36.0 g/dL   RDW 18.9 (H) 11.5 - 15.5 %   Platelets 202 150 - 400 K/uL   nRBC 0.0 0.0 - 0.2 %   Neutrophils Relative % 81 %   Neutro Abs 10.4 (H) 1.7 - 7.7 K/uL   Lymphocytes Relative 8 %   Lymphs Abs 1.1 0.7 - 4.0 K/uL   Monocytes Relative 7 %   Monocytes Absolute 0.9 0.1 - 1.0 K/uL   Eosinophils Relative 3 %   Eosinophils Absolute 0.4 0.0 - 0.5 K/uL   Basophils Relative 0 %   Basophils Absolute 0.0 0.0 - 0.1 K/uL   Immature Granulocytes 1 %   Abs Immature Granulocytes 0.06 0.00 - 0.07 K/uL    Comment: Performed at [facility], [address] [place], [place] [address]  Magnesium     Status: None   Collection Time: 12/11/20  5:42 AM  Result Value Ref Range   Magnesium 2.1 1.7 - 2.4 mg/dL    Comment: Performed at [facility], [address] [place],  [address]  Renal function panel     Status: Abnormal   Collection Time: 12/11/20  5:42 AM  Result Value Ref Range   Sodium 143 135 - 145 mmol/L   Potassium  3.5 3.5 - 5.1 mmol/L   Chloride 107 98 - 111 mmol/L   CO2 27 22 - 32 mmol/L   Glucose, Bld 173 (H) 70 - 99 mg/dL    Comment: Glucose reference range applies only to samples taken after fasting for at least 8 hours.   BUN 28 (H) 6 -  20 mg/dL   Creatinine, Ser 0.73 0.44 - 1.00 mg/dL   Calcium 8.1 (L) 8.9 - 10.3 mg/dL   Phosphorus 3.7 2.5 - 4.6 mg/dL   Albumin 2.1 (L) 3.5 - 5.0 g/dL   GFR, Estimated >60 >60 mL/min    Comment: (NOTE) Calculated using the CKD-EPI Creatinine Equation (2021)    Anion gap 9 5 - 15    Comment: Performed at [facility], [address] [place], [place] [address]  Glucose, capillary     Status: Abnormal   Collection Time: 12/11/20  7:29 AM  Result Value Ref Range   Glucose-Capillary 161 (H) 70 - 99 mg/dL    Comment: Glucose reference range applies only to samples taken after fasting for at least 8 hours.  Glucose, capillary     Status: Abnormal   Collection Time: 12/11/20 11:23 AM  Result Value Ref Range   Glucose-Capillary 177 (H) 70 - 99 mg/dL    Comment: Glucose reference range applies only to samples taken after fasting for at least 8 hours.  Troponin I (High Sensitivity)     Status: Abnormal   Collection Time: 12/11/20  1:32 PM  Result Value Ref Range   Troponin I (High Sensitivity) 60 (H) <18 ng/L    Comment: (NOTE) Elevated high sensitivity troponin I (hsTnI) values and significant  changes across serial measurements may suggest ACS but many other  chronic and acute conditions are known to elevate hsTnI results.  Refer to the "Links" section for chest pain algorithms and additional  guidance. Performed at [facility], [address] [place], [place] [address]   Triglycerides     Status: None   Collection Time: 12/11/20  3:28 PM  Result Value Ref Range   Triglycerides 78 <150 mg/dL    Comment: Performed at [facility], [place], [place] [address]  Troponin I (High Sensitivity)     Status: Abnormal   Collection Time: 12/11/20  3:28 PM  Result Value Ref Range   Troponin I (High Sensitivity) 115 (HH) <18 ng/L    Comment: CRITICAL RESULT CALLED TO, READ BACK BY AND VERIFIED WITH CARLA PUGH @1635  12/11/20 MJU (NOTE) Elevated  high sensitivity troponin I (hsTnI) values and significant  changes across serial measurements may suggest ACS but many other  chronic and acute conditions are known to elevate hsTnI results.  Refer to the "Links" section for chest pain algorithms and additional  guidance. Performed at [facility], [address] [place], [place] [address]   Glucose, capillary     Status: Abnormal   Collection Time: 12/11/20  3:45 PM  Result Value Ref Range   Glucose-Capillary 161 (H) 70 - 99 mg/dL    Comment: Glucose reference range applies only to samples taken after fasting for at least 8 hours.  Darletta Moll Chest Port 1 View  Result Date: 12/11/2020 CLINICAL DATA:  Pneumonia. Hypertension. EXAM: PORTABLE CHEST 1 VIEW COMPARISON:  12/09/2020 FINDINGS: Endotracheal tube tip is 3.7 cm above the carina. Nasogastric tube extends into the stomach. Soft Jewel temperature probe noted. Right IJ line tip: SVC. Low lung volumes are present, causing crowding of the  pulmonary vasculature. The patient is rotated to the right on today's radiograph, reducing diagnostic sensitivity and specificity. Mildly improved aeration along both hemidiaphragms although some indistinct basilar airspace opacity persists. There is stable enlargement of the cardiopericardial silhouette with indistinctness of the pulmonary vasculature which is nonspecific but could indicate pulmonary venous hypertension. Potential 1.2 cm left upper lobe nodule projecting over the left posterior fourth rib. IMPRESSION: 1. Potential 1.2 cm left upper lobe nodule. Consider chest CT when feasible for further characterization. 2. Mildly improved aeration along both hemidiaphragms although some indistinct airspace opacities persists. 3. Stable enlargement of the cardiopericardial silhouette. 4. Low lung volumes. Electronically Signed   By: Van Clines M.D.   On: 12/11/2020 09:21  .   ASSESSMENT: Respiratory failure PLAN: Critical care consult involving  over 31 minutes of consultation, chart review, examination, discussion with family, and care coordination. Will schedule tracheostomy as surgery schedule allows. Risks including bleeding, infection, scarring, recurrent laryngeal nerve injury discussed with patient's husband and consent obtained.    [name] Nearing, MD 12/11/2020 6:55 PM

## 2020-12-11 NOTE — Progress Notes (Signed)
[place] for Electrolyte Monitoring and Replacement   Recent Labs: Potassium (mmol/L)  Date Value  12/11/2020 3.5  02/20/2013 3.4 (L)   Magnesium (mg/dL)  Date Value  12/11/2020 2.1   Calcium (mg/dL)  Date Value  12/11/2020 8.1 (L)   Calcium, Total (mg/dL)  Date Value  02/20/2013 8.7   Albumin (g/dL)  Date Value  12/11/2020 2.1 (L)  02/20/2013 3.2 (L)   Phosphorus (mg/dL)  Date Value  12/11/2020 3.7   Sodium (mmol/L)  Date Value  12/11/2020 143  02/20/2013 137     Assessment: 55 year old female presented to the ED via EMS post witnessed cardiac arrest. ROSC achieved following 3 defibrillations and one round of epinephrine. Patient found to have significant hypokalemia on arrival. Given witnessed arrest, patient Code ICE. Pharmacy to manage electrolytes.     Goal of Therapy:  Electrolytes WNL, K ~ 4 and Mg ~ 2 given cardiac events  Plan:  Electrolytes stable. Will follow up with morning labs.  Tawnya Crook, PharmD 12/11/2020 12:30 PM

## 2020-12-11 NOTE — Progress Notes (Signed)
NAME:  [name] [name], MRN:  [id number], DOB:  1966/04/16, LOS: 9 ADMISSION DATE:  12/02/2020, CONSULTATION DATE:  12/02/2020 REFERRING MD:  Jodell Cipro, CHIEF COMPLAINT:  Cardiac arrest  Brief History:  [name] with acute cardiac arrest Labs c/w severe acidosis and severe DKA Patient with witnessed V fib arrest and shocked in the field ROSC returned is unknown  1/10 admitted to ICU for cardiac arrest, started hypothermia protocol 1/11 hypothermia protocol +posturing 1/12 signs of brain damage, needs NEURO consultation 1/13 failed SAT/SBT, unable to follow commands 1/14- for SBT today, electrolytes are improved. Encephalopathic working on mental status.  12/07/20- Patient remains crtically ill and is with interval worsening MRI brain, I met with husband to briefly review.  Discussed case with Neurologist today and there is plan for meeting with family.  Past Medical History:  Hypertension  Significant Hospital Events:  ETT 1/10>>   Consults:  Neurology Palliative Care  Procedures:  ETT 1/10 CVL 1/11  Significant Diagnostic Tests:  12/07/2020 MRI Brain 1. Progressive DWI and FLAIR abnormality within the parietotemporal cortices and the bilateral corpus striatum, consistent with anoxic brain injury. 2. No hemorrhage or mass effect.  Echo 12/03/2020 1. Left ventricular ejection fraction, by estimation, is 60 to 65%. The  left ventricle has normal function. The left ventricle has no regional  wall motion abnormalities. There is mild left ventricular hypertrophy.  Left ventricular diastolic parameters  were normal.  2. Right ventricular systolic function is normal. The right ventricular  size is normal. Tricuspid regurgitation signal is inadequate for assessing  PA pressure.  3. A small pericardial effusion is present. The pericardial effusion is  posterior and lateral to the left ventricle.  4. The mitral valve is grossly normal. No evidence of mitral valve   regurgitation. No evidence of mitral stenosis.  5. The aortic valve is tricuspid. Aortic valve regurgitation is not  visualized. No aortic stenosis is present.  6. The inferior vena cava is normal in size with greater than 50%  respiratory variability, suggesting right atrial pressure of 3 mmHg.  EEG 12/04/2020 This study is suggestive of severe diffuse encephalopathy, nonspecific etiology. No seizures or epileptiform discharges were seen throughout the recording  Micro Data:  12/02/20 MRSA Screen Negative 12/02/20 Bld Cx negative  Antimicrobials:  Ceftriaxone 1/10 - 1/12  Interim History / Subjective:  No acute events overnight.   Family discussions held yesterday. Plan is for trach and PEG at some point. Will readdress on Thursday.    Objective   Blood pressure (!) 152/78, pulse 89, temperature (!) 100.58 F (38.1 C), resp. rate 20, height 5' 0.98" (1.549 m), weight 94.3 kg, SpO2 96 %. CVP:  [9 mmHg-29 mmHg] 16 mmHg  Vent Mode: PRVC FiO2 (%):  [28 %] 28 % Set Rate:  [15 bmp-154 bmp] 15 bmp Vt Set:  [500 mL] 500 mL PEEP:  [5 cmH20] 5 cmH20   Intake/Output Summary (Last 24 hours) at 12/11/2020 0742 Last data filed at 12/11/2020 0700 Gross per 24 hour  Intake 1791.41 ml  Output 1750 ml  Net 41.41 ml   Filed Weights   12/07/20 0455 12/08/20 0500 12/11/20 0500  Weight: 94.4 kg 94.3 kg 94.3 kg    Examination: General: Intubated, no acute distress HENT: [place]/AT, ET tube in place, sclera anicteric, PERRL Lungs: Course respirations. No wheezing or rhonchi. Cardiovascular: RRR, s1s2, no murmur Abdomen: soft, non-distended, BS+ Extremities: warm, no edema Neuro: awake, does not follow commands. Gag +, corneal reflex + GU: foley  in place  Resolved Hospital Problem list   Acute Kidney Injury  Assessment & Plan:   Severe Anoxic Brain Injury In setting of cardiac arrest and prolonged down time - Neurology Following - Will continue sedation for comfort - Poor long term  prognosis, neurology and palliative care following  Cardiac Arrest -s/p hypothermia protocol - Echo with normal EF and no wall motion abnormalities - Continue telemetry monitoring  Acute Hypoxemic Respiratory Failure In setting of cardiac arrest and anoxic brain injury - Requiring minimal vent settings at this time - Keep intubated for airway protection for now - Plan for trach at some point - VAP bundle ordered - Sputum culture with abundant Gram positive rods, patient febrile, MRSA screen negative. - Start course of ceftriaxone for concern of pneumonia - Will check chest radiograph   Hypernatremia - Na 143 today, down from 147 - Continue FWF 200cc Q6hr  Fever Leukocytosis - Tmax 100.9 in past 24hours - WBC 14, which has been increasing over the past couple days - Last cultured 1/10 - Sent UA, Blood cultures, Sputum 1/18 - No concern for UTI. Blood cultures pending. Sputum growth showing gram positive cocci.  Anemia Some vaginal bleeding has been noted, but not a concerning amount at this time. Blood draws possibly contributing since hospitalization - Trend CBC  Best practice (evaluated daily)  Diet: TF Pain/Anxiety/Delirium protocol (if indicated): Precedex, Fentanyl, PRN versed VAP protocol (if indicated): Ordered DVT prophylaxis: heparin GI prophylaxis: PPI daily Glucose control: SSI Mobility: bed rest Disposition:ICU Code Status: Intubate Only  Labs   CBC: Recent Labs  Lab 12/07/20 0410 12/08/20 0500 12/09/20 0254 12/09/20 1600 12/10/20 0415 12/11/20 0542  WBC 7.4 10.0 9.3 13.7* 14.3* 12.8*  NEUTROABS 5.6 6.9 6.6  --  10.8* 10.4*  HGB 7.5* 7.7* 6.9* 8.3* 8.6* 7.5*  HCT 24.7* 26.3* 23.1* 27.5* 28.4* 24.0*  MCV 80.5 81.9 82.8 81.4 81.1 82.5  PLT 188 184 187 204 214 536    Basic Metabolic Panel: Recent Labs  Lab 12/07/20 0410 12/08/20 0500 12/09/20 0254 12/10/20 0415 12/11/20 0542  NA 147* 147* 150* 147* 143  K 3.7 3.4* 3.9 3.6 3.5  CL 113*  113* 115* 111 107  CO2 26 24 26 25 27   GLUCOSE 162* 197* 137* 154* 173*  BUN 27* 33* 34* 22* 28*  CREATININE 0.72 0.80 0.69 0.66 0.73  CALCIUM 8.4* 8.4* 8.3* 8.2* 8.1*  MG 2.1 2.1 2.1 2.0 2.1  PHOS 3.6 3.7 4.2 3.9 3.7   GFR: Estimated Creatinine Clearance: 84.3 mL/min (by C-G formula based on SCr of 0.73 mg/dL). Recent Labs  Lab 12/09/20 0254 12/09/20 1600 12/10/20 0415 12/11/20 0542  WBC 9.3 13.7* 14.3* 12.8*    Liver Function Tests: Recent Labs  Lab 12/10/20 0415 12/11/20 0542  ALBUMIN 2.3* 2.1*   No results for input(s): LIPASE, AMYLASE in the last 168 hours. No results for input(s): AMMONIA in the last 168 hours.  ABG    Component Value Date/Time   PHART 7.35 12/02/2020 0933   PCO2ART 47 12/02/2020 0933   PO2ART 70 (L) 12/02/2020 0933   HCO3 25.9 12/02/2020 0933   O2SAT 92.9 12/02/2020 0933     Coagulation Profile: No results for input(s): INR, PROTIME in the last 168 hours.  Cardiac Enzymes: No results for input(s): CKTOTAL, CKMB, CKMBINDEX, TROPONINI in the last 168 hours.  HbA1C: Hgb A1c MFr Bld  Date/Time Value Ref Range Status  12/02/2020 09:36 AM 5.9 (H) 4.8 - 5.6 % Final  Comment:    (NOTE) Pre diabetes:          5.7%-6.4%  Diabetes:              >6.4%  Glycemic control for   <7.0% adults with diabetes     CBG: Recent Labs  Lab 12/10/20 1638 12/10/20 1932 12/10/20 2304 12/11/20 0309 12/11/20 0729  GLUCAP 161* 146* 138* 145* 161*      Critical care time: 40 minutes     Freda Jackson, MD [facility] Office: [phone]   See Amion for Pager Details

## 2020-12-12 ENCOUNTER — Telehealth: Payer: Self-pay | Admitting: Pulmonary Disease

## 2020-12-12 ENCOUNTER — Inpatient Hospital Stay: Payer: Self-pay

## 2020-12-12 LAB — CBC WITH DIFFERENTIAL/PLATELET
Abs Immature Granulocytes: 0.11 10*3/uL — ABNORMAL HIGH (ref 0.00–0.07)
Basophils Absolute: 0.1 10*3/uL (ref 0.0–0.1)
Basophils Relative: 1 %
Eosinophils Absolute: 0.4 10*3/uL (ref 0.0–0.5)
Eosinophils Relative: 2 %
HCT: 28.5 % — ABNORMAL LOW (ref 36.0–46.0)
Hemoglobin: 8.5 g/dL — ABNORMAL LOW (ref 12.0–15.0)
Immature Granulocytes: 1 %
Lymphocytes Relative: 8 %
Lymphs Abs: 1.5 10*3/uL (ref 0.7–4.0)
MCH: 24.4 pg — ABNORMAL LOW (ref 26.0–34.0)
MCHC: 29.8 g/dL — ABNORMAL LOW (ref 30.0–36.0)
MCV: 81.7 fL (ref 80.0–100.0)
Monocytes Absolute: 1.1 10*3/uL — ABNORMAL HIGH (ref 0.1–1.0)
Monocytes Relative: 6 %
Neutro Abs: 15.2 10*3/uL — ABNORMAL HIGH (ref 1.7–7.7)
Neutrophils Relative %: 82 %
Platelets: 229 10*3/uL (ref 150–400)
RBC: 3.49 MIL/uL — ABNORMAL LOW (ref 3.87–5.11)
RDW: 19.4 % — ABNORMAL HIGH (ref 11.5–15.5)
WBC: 18.5 10*3/uL — ABNORMAL HIGH (ref 4.0–10.5)
nRBC: 0 % (ref 0.0–0.2)

## 2020-12-12 LAB — BASIC METABOLIC PANEL
Anion gap: 10 (ref 5–15)
BUN: 33 mg/dL — ABNORMAL HIGH (ref 6–20)
CO2: 25 mmol/L (ref 22–32)
Calcium: 8.3 mg/dL — ABNORMAL LOW (ref 8.9–10.3)
Chloride: 107 mmol/L (ref 98–111)
Creatinine, Ser: 0.82 mg/dL (ref 0.44–1.00)
GFR, Estimated: >60 mL/min (ref >60)
Glucose, Bld: 157 mg/dL — ABNORMAL HIGH (ref 70–99)
Potassium: 3.4 mmol/L — ABNORMAL LOW (ref 3.5–5.1)
Sodium: 142 mmol/L (ref 135–145)

## 2020-12-12 LAB — RENAL FUNCTION PANEL
Albumin: 2 g/dL — ABNORMAL LOW (ref 3.5–5.0)
Anion gap: 9 (ref 5–15)
BUN: 32 mg/dL — ABNORMAL HIGH (ref 6–20)
CO2: 26 mmol/L (ref 22–32)
Calcium: 8.3 mg/dL — ABNORMAL LOW (ref 8.9–10.3)
Chloride: 109 mmol/L (ref 98–111)
Creatinine, Ser: 0.75 mg/dL (ref 0.44–1.00)
GFR, Estimated: >60 mL/min (ref >60)
Glucose, Bld: 156 mg/dL — ABNORMAL HIGH (ref 70–99)
Phosphorus: 3.7 mg/dL (ref 2.5–4.6)
Potassium: 3.4 mmol/L — ABNORMAL LOW (ref 3.5–5.1)
Sodium: 144 mmol/L (ref 135–145)

## 2020-12-12 LAB — GLUCOSE, CAPILLARY
Glucose-Capillary: 147 mg/dL — ABNORMAL HIGH (ref 70–99)
Glucose-Capillary: 159 mg/dL — ABNORMAL HIGH (ref 70–99)
Glucose-Capillary: 164 mg/dL — ABNORMAL HIGH (ref 70–99)
Glucose-Capillary: 174 mg/dL — ABNORMAL HIGH (ref 70–99)
Glucose-Capillary: 177 mg/dL — ABNORMAL HIGH (ref 70–99)
Glucose-Capillary: 185 mg/dL — ABNORMAL HIGH (ref 70–99)

## 2020-12-12 LAB — PHOSPHORUS: Phosphorus: 3.7 mg/dL (ref 2.5–4.6)

## 2020-12-12 LAB — MAGNESIUM: Magnesium: 2 mg/dL (ref 1.7–2.4)

## 2020-12-12 MED ORDER — SODIUM CHLORIDE 0.9% FLUSH
10.0000 mL | INTRAVENOUS | Status: DC | PRN
  Administered 2020-12-26: 10 mL

## 2020-12-12 MED ORDER — METOLAZONE 5 MG PO TABS
5.0000 mg | ORAL_TABLET | Freq: Once | ORAL | Status: AC
  Administered 2020-12-12: 5 mg via ORAL
  Filled 2020-12-12: qty 1

## 2020-12-12 MED ORDER — POTASSIUM CHLORIDE 20 MEQ PO PACK
20.0000 meq | PACK | Freq: Once | ORAL | Status: AC
  Administered 2020-12-12: 20 meq
  Filled 2020-12-12: qty 1

## 2020-12-12 MED ORDER — FUROSEMIDE 10 MG/ML IJ SOLN
40.0000 mg | Freq: Once | INTRAMUSCULAR | Status: AC
  Administered 2020-12-12: 40 mg via INTRAVENOUS
  Filled 2020-12-12: qty 4

## 2020-12-12 MED ORDER — SODIUM CHLORIDE 0.9% FLUSH
10.0000 mL | Freq: Two times a day (BID) | INTRAVENOUS | Status: DC
  Administered 2020-12-12 – 2020-12-18 (×8): 10 mL
  Administered 2020-12-18: 20 mL
  Administered 2020-12-19 – 2020-12-31 (×22): 10 mL
  Administered 2021-01-01: 20 mL
  Administered 2021-01-02 – 2021-01-09 (×13): 10 mL

## 2020-12-12 MED ORDER — POTASSIUM CHLORIDE 20 MEQ PO PACK
40.0000 meq | PACK | Freq: Once | ORAL | Status: AC
  Administered 2020-12-12: 40 meq
  Filled 2020-12-12: qty 2

## 2020-12-12 NOTE — Progress Notes (Signed)
Consent for tracheostomy signed by pt husband Mr. Remi Haggard, witnessed by Leanord Hawking, still for doctor signature, please kindly see the note, procedure was explained by the doctor

## 2020-12-12 NOTE — Progress Notes (Signed)
All 3 port of central line no back flow, but with good flush, lab in AM not done, infomed lab tech and said will send phlebotomist to do the extraction

## 2020-12-12 NOTE — Telephone Encounter (Signed)
[facility]:  We were contacted by the [facility].  Recommendations: lung nodule follow up Appointment requested: pending hospital discharge  Will alert Dr. Erin Fulling regarding recommendations and follow up   [facility] 12/12/2020 6:37 PM

## 2020-12-12 NOTE — Progress Notes (Signed)
NAME:  [name] [name], MRN:  [id number], DOB:  1966-08-13, LOS: 55 ADMISSION DATE:  12/02/2020, CONSULTATION DATE:  12/02/2020 REFERRING MD:  Jodell Cipro, CHIEF COMPLAINT:  Cardiac arrest  Brief History:  [name] with acute cardiac arrest Labs c/w severe acidosis and severe DKA Patient with witnessed V fib arrest and shocked in the field ROSC returned is unknown  1/10 admitted to ICU for cardiac arrest, started hypothermia protocol 1/11 hypothermia protocol +posturing 1/12 signs of brain damage, needs NEURO consultation 1/13 failed SAT/SBT, unable to follow commands 1/14- for SBT today, electrolytes are improved. Encephalopathic working on mental status.  12/07/20- Patient remains crtically ill and is with interval worsening MRI brain, I met with husband to briefly review.  Discussed case with Neurologist today and there is plan for meeting with family.  Past Medical History:  Hypertension  Significant Hospital Events:  ETT 1/10>>   Consults:  Neurology Palliative Care ENT GI   Procedures:  ETT 1/10 CVL 1/11  Significant Diagnostic Tests:  12/07/2020 MRI Brain 1. Progressive DWI and FLAIR abnormality within the parietotemporal cortices and the bilateral corpus striatum, consistent with anoxic brain injury. 2. No hemorrhage or mass effect.  Echo 12/03/2020 1. Left ventricular ejection fraction, by estimation, is 60 to 65%. The  left ventricle has normal function. The left ventricle has no regional  wall motion abnormalities. There is mild left ventricular hypertrophy.  Left ventricular diastolic parameters  were normal.  2. Right ventricular systolic function is normal. The right ventricular  size is normal. Tricuspid regurgitation signal is inadequate for assessing  PA pressure.  3. A small pericardial effusion is present. The pericardial effusion is  posterior and lateral to the left ventricle.  4. The mitral valve is grossly normal. No evidence of mitral valve   regurgitation. No evidence of mitral stenosis.  5. The aortic valve is tricuspid. Aortic valve regurgitation is not  visualized. No aortic stenosis is present.  6. The inferior vena cava is normal in size with greater than 50%  respiratory variability, suggesting right atrial pressure of 3 mmHg.  EEG 12/04/2020 This study is suggestive of severe diffuse encephalopathy, nonspecific etiology. No seizures or epileptiform discharges were seen throughout the recording  Micro Data:  12/02/20 MRSA Screen Negative 12/02/20 Bld Cx negative  Antimicrobials:  Ceftriaxone 1/10 - 1/12  Interim History / Subjective:  No acute events overnight.      Objective   Blood pressure (!) 86/47, pulse 68, temperature 99.86 F (37.7 C), temperature source Core, resp. rate 16, height 5' 0.98" (1.549 m), weight 94.3 kg, SpO2 96 %. CVP:  [16 mmHg-36 mmHg] 18 mmHg  Vent Mode: PRVC FiO2 (%):  [28 %-35 %] 35 % Set Rate:  [15 bmp] 15 bmp Vt Set:  [500 mL] 500 mL PEEP:  [5 cmH20] 5 cmH20   Intake/Output Summary (Last 24 hours) at 12/12/2020 0930 Last data filed at 12/12/2020 0600 Gross per 24 hour  Intake 3213.56 ml  Output 3850 ml  Net -636.44 ml   Filed Weights   12/07/20 0455 12/08/20 0500 12/11/20 0500  Weight: 94.4 kg 94.3 kg 94.3 kg    Examination: General: Intubated, no acute distress HENT: [place]/AT, ET tube in place, sclera anicteric, PERRL Lungs: Course respirations on right.  Cardiovascular: RRR, s1s2, no murmur Abdomen: soft, non-distended, BS+ Extremities: warm, no edema Neuro: awake, opens eyes to verbal stimuli. Does not follow commands. Gag +, corneal reflex + GU: foley in place  Resolved Hospital Problem list  Acute Kidney Injury  Assessment & Plan:   Severe Anoxic Brain Injury In setting of cardiac arrest and prolonged down time - Neurology Following - Will continue sedation for comfort - Poor long term prognosis, neurology and palliative care following - ENT consulted for  tracheostomy and GI for PEG tube placement  Cardiac Arrest -s/p hypothermia protocol - Echo with normal EF and no wall motion abnormalities - Continue telemetry monitoring, EKG 1/19 showing normal sinus rhythm with polymorphic premature ventricular contractions. No ischemic changes  Acute Hypoxemic Respiratory Failure In setting of cardiac arrest and anoxic brain injury - Requiring minimal vent settings at this time - Keep intubated for airway protection for now - Plan for trach at some point - VAP bundle ordered - Sputum culture with abundant Gram positive rods, patient febrile, MRSA screen negative. - Ceftriaxone started 1/19 for concern of pneumonia - ENT consulted for tracheostomy   Hypernatremia - Improved - Will hold free water flushes today and work on diuresis with lasix and metolazone  Fever Leukocytosis - Tmax 100.9 in past 24hours - WBC 14, which has been increasing over the past couple days - Last cultured 1/10 - Sent UA, Blood cultures, Sputum 1/18 - No concern for UTI. Blood cultures pending. Sputum growth showing gram positive cocci.  Anemia Some vaginal bleeding has been noted, but not a concerning amount at this time. Blood draws possibly contributing since hospitalization - Trend CBC  Best practice (evaluated daily)  Diet: TF Pain/Anxiety/Delirium protocol (if indicated): Precedex, Fentanyl, PRN versed VAP protocol (if indicated): Ordered DVT prophylaxis: heparin GI prophylaxis: PPI daily Glucose control: SSI Mobility: bed rest Disposition:ICU Code Status: Intubate Only Discussed updates with patient's husband at bedside on 1/19  Labs   CBC: Recent Labs  Lab 12/08/20 0500 12/09/20 0254 12/09/20 1600 12/10/20 0415 12/11/20 0542 12/12/20 0916  WBC 10.0 9.3 13.7* 14.3* 12.8* 18.5*  NEUTROABS 6.9 6.6  --  10.8* 10.4* 15.2*  HGB 7.7* 6.9* 8.3* 8.6* 7.5* 8.5*  HCT 26.3* 23.1* 27.5* 28.4* 24.0* 28.5*  MCV 81.9 82.8 81.4 81.1 82.5 81.7  PLT 184  187 204 214 202 980    Basic Metabolic Panel: Recent Labs  Lab 12/08/20 0500 12/09/20 0254 12/10/20 0415 12/11/20 0542 12/12/20 0657  NA 147* 150* 147* 143 142  144  K 3.4* 3.9 3.6 3.5 3.4*  3.4*  CL 113* 115* 111 107 107  109  CO2 _0 GLUCOSE 197* 137* 154* 173* 157*  156*  BUN 33* 34* 22* 28* 33*  32*  CREATININE 0.80 0.69 0.66 0.73 0.82  0.75  CALCIUM 8.4* 8.3* 8.2* 8.1* 8.3*  8.3*  MG 2.1 2.1 2.0 2.1 2.0  PHOS 3.7 4.2 3.9 3.7 3.7  3.7   GFR: Estimated Creatinine Clearance: 84.3 mL/min (by C-G formula based on SCr of 0.75 mg/dL). Recent Labs  Lab 12/09/20 1600 12/10/20 0415 12/11/20 0542 12/12/20 0916  WBC 13.7* 14.3* 12.8* 18.5*    Liver Function Tests: Recent Labs  Lab 12/10/20 0415 12/11/20 0542 12/12/20 0657  ALBUMIN 2.3* 2.1* 2.0*   No results for input(s): LIPASE, AMYLASE in the last 168 hours. No results for input(s): AMMONIA in the last 168 hours.  ABG    Component Value Date/Time   PHART 7.35 12/02/2020 0933   PCO2ART 47 12/02/2020 0933   PO2ART 70 (L) 12/02/2020 0933   HCO3 25.9 12/02/2020 0933   O2SAT 92.9 12/02/2020 0933     Coagulation Profile: No results for  input(s): INR, PROTIME in the last 168 hours.  Cardiac Enzymes: No results for input(s): CKTOTAL, CKMB, CKMBINDEX, TROPONINI in the last 168 hours.  HbA1C: Hgb A1c MFr Bld  Date/Time Value Ref Range Status  12/02/2020 09:36 AM 5.9 (H) 4.8 - 5.6 % Final    Comment:    (NOTE) Pre diabetes:          5.7%-6.4%  Diabetes:              >6.4%  Glycemic control for   <7.0% adults with diabetes     CBG: Recent Labs  Lab 12/11/20 1545 12/11/20 1953 12/11/20 2326 12/12/20 0423 12/12/20 0747  GLUCAP 161* 170* 149* 147* 159*      Critical care time: 35 minutes     Freda Jackson, MD [facility] Office: [phone]   See Amion for Pager Details

## 2020-12-12 NOTE — Progress Notes (Signed)
Peripherally Inserted Central Catheter Placement  The IV Nurse has discussed with the patient and/or persons authorized to consent for the patient, the purpose of this procedure and the potential benefits and risks involved with this procedure.  The benefits include less needle sticks, lab draws from the catheter, and the patient may be discharged home with the catheter. Risks include, but not limited to, infection, bleeding, blood clot (thrombus formation), and puncture of an artery; nerve damage and irregular heartbeat and possibility to perform a PICC exchange if needed/ordered by physician.  Alternatives to this procedure were also discussed.  Bard Power PICC patient education guide, fact sheet on infection prevention and patient information card has been provided to patient /or left at bedside.    PICC Placement Documentation  PICC Double Lumen 12/12/20 PICC Left Brachial 42 cm 1 cm (Active)  Indication for Insertion or Continuance of Line Prolonged intravenous therapies 12/12/20 2220  Exposed Catheter (cm) 1 cm 12/12/20 2220  Site Assessment Clean;Dry;Intact 12/12/20 2220  Lumen #1 Status Capped (Central line);Flushed;Blood return noted 12/12/20 2220  Lumen #2 Status Capped (Central line);Flushed;Blood return noted 12/12/20 2220  Dressing Type Transparent;Occlusive;Securing device 12/12/20 2220  Dressing Status Clean;Dry;Intact 12/12/20 2220  Antimicrobial disc in place? Yes 12/12/20 2220  Safety Lock Not Applicable 36/46/80 3212  Line Care Connections checked and tightened 12/12/20 2220  Line Adjustment (NICU/IV Team Only) No 12/12/20 2220  Dressing Intervention New dressing 12/12/20 2220  Dressing Change Due 12/19/20 12/12/20 2220       [name] [name] 12/12/2020, 10:21 PM

## 2020-12-12 NOTE — Anesthesia Preprocedure Evaluation (Signed)
Anesthesia Evaluation  Patient identified by MRN, date of birth, ID bandGeneral Assessment Comment:Pt admitted 1/10 with cardiac arrest. Intubated since then, diffuse brain damage. Echo OK  Reviewed: Allergy & Precautions, NPO status , Patient's Chart, lab work & pertinent test results, Unable to perform ROS - Chart review only  History of Anesthesia Complications Negative for: history of anesthetic complications  Airway Mallampati: Intubated  TM Distance: >3 FB     Dental  (+) Teeth Intact   Pulmonary neg pulmonary ROS, neg sleep apnea, neg COPD, Patient abstained from smoking.Not current smoker, former smoker,    Pulmonary exam normal breath sounds clear to auscultation       Cardiovascular METShypertension, (-) CAD and (-) Past MI + dysrhythmias  Rhythm:Regular Rate:Tachycardia - Systolic murmurs TTE 9/39/03: 1. Left ventricular ejection fraction, by estimation, is 60 to 65%. The  left ventricle has normal function. The left ventricle has no regional  wall motion abnormalities. There is mild left ventricular hypertrophy.  Left ventricular diastolic parameters  were normal.  2. Right ventricular systolic function is normal. The right ventricular  size is normal. Tricuspid regurgitation signal is inadequate for assessing  PA pressure.  3. A small pericardial effusion is present. The pericardial effusion is  posterior and lateral to the left ventricle.  4. The mitral valve is grossly normal. No evidence of mitral valve  regurgitation. No evidence of mitral stenosis.  5. The aortic valve is tricuspid. Aortic valve regurgitation is not  visualized. No aortic stenosis is present.  6. The inferior vena cava is normal in size with greater than 50%  respiratory variability, suggesting right atrial pressure of 3 mmHg.    Neuro/Psych negative neurological ROS  negative psych ROS   GI/Hepatic neg GERD  ,(+)     (-) substance  abuse  ,   Endo/Other  neg diabetes  Renal/GU negative Renal ROS     Musculoskeletal   Abdominal   Peds  Hematology   Anesthesia Other Findings Past Medical History: No date: Hypertension  Reproductive/Obstetrics                             Anesthesia Physical Anesthesia Plan  ASA: IV  Anesthesia Plan: General   Post-op Pain Management:    Induction: Intravenous  PONV Risk Score and Plan: 3 and Ondansetron, Dexamethasone and Treatment may vary due to age or medical condition  Airway Management Planned: Oral ETT and Tracheostomy  Additional Equipment: None  Intra-op Plan:   Post-operative Plan: Post-operative intubation/ventilation  Informed Consent: I have reviewed the patients History and Physical, chart, labs and discussed the procedure including the risks, benefits and alternatives for the proposed anesthesia with the patient or authorized representative who has indicated his/her understanding and acceptance.     Dental advisory given and Consent reviewed with POA  Plan Discussed with: CRNA and Surgeon  Anesthesia Plan Comments: (Discussed risks of anesthesia with patient's husband at bedside, such as cardiorespiratory and neurological sequelae. He understands. Discussed code status , and husband wishes for [place] to pursue resuscitative efforts within reason and to use our judgement.)        Anesthesia Quick Evaluation

## 2020-12-12 NOTE — Consult Note (Signed)
Patient scheduled for tracheostomy tomorrow AM. Please notify me if there is any change in patient status that would affect this.

## 2020-12-12 NOTE — Progress Notes (Signed)
Followed up with lab tech about the blood extraction and said already informed the phlebotomist

## 2020-12-12 NOTE — Progress Notes (Signed)
Meta for Electrolyte Monitoring and Replacement   Recent Labs: Potassium (mmol/L)  Date Value  12/12/2020 3.4 (L)  12/12/2020 3.4 (L)  02/20/2013 3.4 (L)   Magnesium (mg/dL)  Date Value  12/12/2020 2.0   Calcium (mg/dL)  Date Value  12/12/2020 8.3 (L)  12/12/2020 8.3 (L)   Calcium, Total (mg/dL)  Date Value  02/20/2013 8.7   Albumin (g/dL)  Date Value  12/12/2020 2.0 (L)  02/20/2013 3.2 (L)   Phosphorus (mg/dL)  Date Value  12/12/2020 3.7  12/12/2020 3.7   Sodium (mmol/L)  Date Value  12/12/2020 144  12/12/2020 142  02/20/2013 137     Assessment: 55 year old female presented to the ED via EMS post witnessed cardiac arrest. ROSC achieved following 3 defibrillations and one round of epinephrine. Patient found to have significant hypokalemia on arrival. Given witnessed arrest, patient Code ICE. Pharmacy to manage electrolytes.     Goal of Therapy:  Electrolytes WNL, K ~ 4 and Mg ~ 2 given cardiac events  Plan:  Replace potassium with 40 mEq given dose of Lasix today. Will give another dose of 20 mEq this evening as well. Follow up with morning labs.  Tawnya Crook, PharmD 12/12/2020 11:38 AM

## 2020-12-12 NOTE — Care Plan (Signed)
Note made of tracheostomy tomorrow morning. Will attempt PEG placement tomorrow afternoon. Discussed risks/benefits with husband. (Heparin discontinued). Please call with any questions or concerns.  Raylene Miyamoto MD, MPH [facility]

## 2020-12-13 ENCOUNTER — Encounter
Admission: EM | Disposition: A | Discharge status: Rehabilitation Facility | Payer: Self-pay | Source: Home / Self Care | Attending: Internal Medicine

## 2020-12-13 ENCOUNTER — Inpatient Hospital Stay: Payer: Self-pay | Admitting: Certified Registered"

## 2020-12-13 ENCOUNTER — Encounter: Payer: Self-pay | Admitting: Internal Medicine

## 2020-12-13 HISTORY — PX: PEG PLACEMENT: SHX5437

## 2020-12-13 HISTORY — PX: TRACHEOSTOMY TUBE PLACEMENT: SHX814

## 2020-12-13 LAB — CULTURE, RESPIRATORY W GRAM STAIN

## 2020-12-13 LAB — CBC WITH DIFFERENTIAL/PLATELET
Abs Immature Granulocytes: 0.08 10*3/uL — ABNORMAL HIGH (ref 0.00–0.07)
Basophils Absolute: 0.1 10*3/uL (ref 0.0–0.1)
Basophils Relative: 1 %
Eosinophils Absolute: 0.4 10*3/uL (ref 0.0–0.5)
Eosinophils Relative: 3 %
HCT: 25.9 % — ABNORMAL LOW (ref 36.0–46.0)
Hemoglobin: 8 g/dL — ABNORMAL LOW (ref 12.0–15.0)
Immature Granulocytes: 1 %
Lymphocytes Relative: 11 %
Lymphs Abs: 1.4 10*3/uL (ref 0.7–4.0)
MCH: 25.6 pg — ABNORMAL LOW (ref 26.0–34.0)
MCHC: 30.9 g/dL (ref 30.0–36.0)
MCV: 83 fL (ref 80.0–100.0)
Monocytes Absolute: 0.8 10*3/uL (ref 0.1–1.0)
Monocytes Relative: 6 %
Neutro Abs: 10 10*3/uL — ABNORMAL HIGH (ref 1.7–7.7)
Neutrophils Relative %: 78 %
Platelets: 206 10*3/uL (ref 150–400)
RBC: 3.12 MIL/uL — ABNORMAL LOW (ref 3.87–5.11)
RDW: 19.4 % — ABNORMAL HIGH (ref 11.5–15.5)
WBC: 12.7 10*3/uL — ABNORMAL HIGH (ref 4.0–10.5)
nRBC: 0.2 % (ref 0.0–0.2)

## 2020-12-13 LAB — RENAL FUNCTION PANEL
Albumin: 2 g/dL — ABNORMAL LOW (ref 3.5–5.0)
Anion gap: 11 (ref 5–15)
BUN: 26 mg/dL — ABNORMAL HIGH (ref 6–20)
CO2: 31 mmol/L (ref 22–32)
Calcium: 8.4 mg/dL — ABNORMAL LOW (ref 8.9–10.3)
Chloride: 100 mmol/L (ref 98–111)
Creatinine, Ser: 0.65 mg/dL (ref 0.44–1.00)
GFR, Estimated: >60 mL/min (ref >60)
Glucose, Bld: 146 mg/dL — ABNORMAL HIGH (ref 70–99)
Phosphorus: 3.4 mg/dL (ref 2.5–4.6)
Potassium: 3.1 mmol/L — ABNORMAL LOW (ref 3.5–5.1)
Sodium: 142 mmol/L (ref 135–145)

## 2020-12-13 LAB — URINE CULTURE: Culture: 30000 — AB

## 2020-12-13 LAB — GLUCOSE, CAPILLARY
Glucose-Capillary: 147 mg/dL — ABNORMAL HIGH (ref 70–99)
Glucose-Capillary: 128 mg/dL — ABNORMAL HIGH (ref 70–99)
Glucose-Capillary: 149 mg/dL — ABNORMAL HIGH (ref 70–99)
Glucose-Capillary: 164 mg/dL — ABNORMAL HIGH (ref 70–99)
Glucose-Capillary: 164 mg/dL — ABNORMAL HIGH (ref 70–99)

## 2020-12-13 LAB — MAGNESIUM: Magnesium: 1.9 mg/dL (ref 1.7–2.4)

## 2020-12-13 SURGERY — INSERTION, PEG TUBE
Anesthesia: Monitor Anesthesia Care

## 2020-12-13 SURGERY — CREATION, TRACHEOSTOMY
Anesthesia: General

## 2020-12-13 MED ORDER — PROPOFOL 10 MG/ML IV BOLUS
INTRAVENOUS | Status: AC
  Filled 2020-12-13: qty 20

## 2020-12-13 MED ORDER — LIDOCAINE-EPINEPHRINE 1 %-1:100000 IJ SOLN
INTRAMUSCULAR | Status: DC | PRN
  Administered 2020-12-13: 4 mL

## 2020-12-13 MED ORDER — FENTANYL CITRATE (PF) 100 MCG/2ML IJ SOLN
INTRAMUSCULAR | Status: AC
  Filled 2020-12-13: qty 2

## 2020-12-13 MED ORDER — MIDAZOLAM HCL 2 MG/2ML IJ SOLN
INTRAMUSCULAR | Status: DC | PRN
  Administered 2020-12-13 (×2): 2 mg via INTRAVENOUS

## 2020-12-13 MED ORDER — CEFAZOLIN SODIUM-DEXTROSE 2-4 GM/100ML-% IV SOLN: 2.0000 g | Freq: Three times a day (TID) | INTRAVENOUS | Status: DC

## 2020-12-13 MED ORDER — LACTATED RINGERS IV SOLN: INTRAVENOUS | Status: DC | PRN

## 2020-12-13 MED ORDER — MAGNESIUM SULFATE 2 GM/50ML IV SOLN
2.0000 g | Freq: Once | INTRAVENOUS | Status: AC
  Administered 2020-12-13: 2 g via INTRAVENOUS
  Filled 2020-12-13: qty 50

## 2020-12-13 MED ORDER — VECURONIUM BROMIDE 10 MG IV SOLR
10.0000 mg | Freq: Once | INTRAVENOUS | Status: AC
  Administered 2020-12-13: 10 mg via INTRAVENOUS

## 2020-12-13 MED ORDER — ROCURONIUM BROMIDE 100 MG/10ML IV SOLN
INTRAVENOUS | Status: DC | PRN
  Administered 2020-12-13: 20 mg via INTRAVENOUS
  Administered 2020-12-13: 50 mg via INTRAVENOUS
  Administered 2020-12-13: 30 mg via INTRAVENOUS

## 2020-12-13 MED ORDER — SODIUM CHLORIDE 0.9 % IV SOLN
3.0000 g | Freq: Four times a day (QID) | INTRAVENOUS | Status: AC
  Administered 2020-12-13 – 2020-12-20 (×28): 3 g via INTRAVENOUS
  Filled 2020-12-13 (×6): qty 3
  Filled 2020-12-13: qty 8
  Filled 2020-12-13 (×2): qty 3
  Filled 2020-12-13 (×3): qty 8
  Filled 2020-12-13 (×2): qty 3
  Filled 2020-12-13: qty 8
  Filled 2020-12-13 (×2): qty 3
  Filled 2020-12-13: qty 8
  Filled 2020-12-13 (×2): qty 3
  Filled 2020-12-13 (×4): qty 8
  Filled 2020-12-13 (×5): qty 3
  Filled 2020-12-13 (×4): qty 8
  Filled 2020-12-13 (×2): qty 3

## 2020-12-13 MED ORDER — POTASSIUM CHLORIDE 10 MEQ/50ML IV SOLN
10.0000 meq | INTRAVENOUS | Status: AC
  Administered 2020-12-13 (×5): 10 meq via INTRAVENOUS
  Filled 2020-12-13 (×6): qty 50

## 2020-12-13 MED ORDER — FUROSEMIDE 10 MG/ML IJ SOLN
20.0000 mg | Freq: Once | INTRAMUSCULAR | Status: AC
  Administered 2020-12-13: 20 mg via INTRAVENOUS
  Filled 2020-12-13: qty 2

## 2020-12-13 MED ORDER — CEFAZOLIN SODIUM-DEXTROSE 2-4 GM/100ML-% IV SOLN
2.0000 g | Freq: Once | INTRAVENOUS | Status: AC
  Administered 2020-12-13: 2 g via INTRAVENOUS
  Filled 2020-12-13: qty 100

## 2020-12-13 MED ORDER — MIDAZOLAM HCL 2 MG/2ML IJ SOLN
INTRAMUSCULAR | Status: AC
  Filled 2020-12-13: qty 2

## 2020-12-13 MED ORDER — PHENYLEPHRINE HCL (PRESSORS) 10 MG/ML IV SOLN
INTRAVENOUS | Status: DC | PRN
  Administered 2020-12-13: 100 ug via INTRAVENOUS

## 2020-12-13 SURGICAL SUPPLY — 31 items
BLADE SURG 15 STRL LF DISP TIS (BLADE) ×1 IMPLANT
BLADE SURG 15 STRL SS (BLADE) ×2
BLADE SURG SZ11 CARB STEEL (BLADE) ×2 IMPLANT
CANISTER SUCT 1200ML W/VALVE (MISCELLANEOUS) ×2 IMPLANT
COVER WAND RF STERILE (DRAPES) ×2 IMPLANT
ELECT REM PT RETURN 9FT ADLT (ELECTROSURGICAL) ×2
ELECTRODE REM PT RTRN 9FT ADLT (ELECTROSURGICAL) ×1 IMPLANT
GAUZE PACKING IODOFORM 1/2 (PACKING) IMPLANT
GLOVE BIO SURGEON STRL SZ7.5 (GLOVE) ×4 IMPLANT
GOWN STRL REUS W/ TWL LRG LVL3 (GOWN DISPOSABLE) ×2 IMPLANT
GOWN STRL REUS W/TWL LRG LVL3 (GOWN DISPOSABLE) ×4
HEMOSTAT SURGICEL 2X3 (HEMOSTASIS) ×2 IMPLANT
HLDR TRACH TUBE NECKBAND 18 (MISCELLANEOUS) ×1 IMPLANT
HOLDER TRACH TUBE NECKBAND 18 (MISCELLANEOUS) ×1
KIT TURNOVER KIT A (KITS) ×2 IMPLANT
LABEL OR SOLS (LABEL) ×2 IMPLANT
MANIFOLD NEPTUNE II (INSTRUMENTS) ×2 IMPLANT
NS IRRIG 500ML POUR BTL (IV SOLUTION) ×2 IMPLANT
PACK HEAD/NECK (MISCELLANEOUS) ×2 IMPLANT
SHEARS HARMONIC 9CM CVD (BLADE) ×2 IMPLANT
SPONGE DRAIN TRACH 4X4 STRL 2S (GAUZE/BANDAGES/DRESSINGS) ×2 IMPLANT
SPONGE KITTNER 5P (MISCELLANEOUS) ×2 IMPLANT
SUCTION FRAZIER HANDLE 10FR (MISCELLANEOUS)
SUCTION TUBE FRAZIER 10FR DISP (MISCELLANEOUS) IMPLANT
SUT ETHILON 2 0 FS 18 (SUTURE) ×2 IMPLANT
SUT SILK 2 0 (SUTURE) ×2
SUT SILK 2 0 SH (SUTURE) IMPLANT
SUT SILK 2-0 18XBRD TIE 12 (SUTURE) ×1 IMPLANT
SUT VIC AB 3-0 PS2 18 (SUTURE) ×2 IMPLANT
TUBE TRACH  6.0 CUFF FLEX (MISCELLANEOUS) ×1
TUBE TRACH 6.0 CUFF FLEX (MISCELLANEOUS) ×1 IMPLANT

## 2020-12-13 NOTE — Op Note (Addendum)
[facility] Gastroenterology Patient Name: [name] [name] Procedure Date: 12/13/2020 1:23 PM MRN: [id number] Account #: [id number] Date of Birth: 07-21-1966 Admit Type: Inpatient Age: 55 Room: [facility] ENDO ROOM 3 Gender: Female Note Status: Supervisor Override Procedure:             Upper GI endoscopy Indications:           Place PEG due to neurological disorder causing                         impaired swallowing Providers:             Andrey Farmer MD, MD, Benjamine Sprague MD, MD Referring MD:          Precious Bard, MD (Referring MD) Medicines:             Monitored Anesthesia Care Procedure:             Pre-Anesthesia Assessment:                        - Prior to the procedure, a History and Physical was                         performed, and patient medications and allergies were                         reviewed. The patient is competent. The risks and                         benefits of the procedure and the sedation options and                         risks were discussed with the patient. All questions                         were answered and informed consent was obtained.                         Patient identification and proposed procedure were                         verified by the physician, the nurse and the                         technician in the procedure room. Mental Status                         Examination: comatose. Airway Examination: normal                         oropharyngeal airway and neck mobility. Respiratory                         Examination: clear to auscultation. CV Examination:                         normal. Prophylactic Antibiotics: The patient requires                         prophylactic antibiotics for planned  PEG placement.                         The patient received antibiotic therapy today, before                         the procedure started. Prior Anticoagulants: The                         patient has taken no  previous anticoagulant or                         antiplatelet agents. ASA Grade Assessment: IV - A                         patient with severe systemic disease that is a                         constant threat to life. After reviewing the risks and                         benefits, the patient was deemed in satisfactory                         condition to undergo the procedure. The anesthesia                         plan was to use deep sedation / analgesia. Immediately                         prior to administration of medications, the patient                         was re-assessed for adequacy to receive sedatives. The                         heart rate, respiratory rate, oxygen saturations,                         blood pressure, adequacy of pulmonary ventilation, and                         response to care were monitored throughout the                         procedure. The physical status of the patient was                         re-assessed after the procedure.                        After obtaining informed consent, the endoscope was                         passed under direct vision. Throughout the procedure,                         the patient's blood pressure, pulse, and oxygen  saturations were monitored continuously. The Endoscope                         was introduced through the mouth, and advanced to the                         second part of duodenum. The upper GI endoscopy was                         accomplished without difficulty. The patient tolerated                         the procedure well. Findings:      A small hiatal hernia was present.      The entire examined stomach was normal.      The examined duodenum was normal.      The patient was placed in the supine position for PEG placement. The       stomach was insufflated to appose gastric and abdominal walls. A site       was located in the body of the stomach with good  transillumination and       manual external pressure for placement. The abdominal wall was marked       and prepped in a sterile manner. The trocar needle was introduced       through the abdominal wall and into the stomach under direct endoscopic       view. A snare was introduced through the endoscope and opened in the       gastric lumen. The guide wire was passed through the trocar and into the       open snare. The snare was closed around the guide wire. The endoscope       and snare were removed, pulling the wire out through the mouth. A skin       incision was made at the site of needle insertion. The externally       removable 20 Fr EndoVive Safety gastrostomy tube was lubricated. The       G-tube was tied to the guide wire and pulled through the mouth and into       the stomach. The trocar needle was removed, and the gastrostomy tube was       pulled out from the stomach through the skin. The external bumper was       attached to the gastrostomy tube, and the tube was cut to remove the       guide wire. The final position of the gastrostomy tube was confirmed by       relook endoscopy, and skin marking noted to be 5 cm at the external       bumper. The final tension and compression of the abdominal wall by the       PEG tube and external bumper were checked and revealed that the bumper       was loose and lightly touching the skin. The feeding tube was capped,       and the tube site cleaned and dressed. Please note Dr. Haig Prophet       performed the endoscopic exam and endoscopic portion of the PEG       placement. Dr. Lysle Pearl performed the surgical portion of the procedure. Impression:            -  Small hiatal hernia.                        - Normal stomach.                        - Normal examined duodenum.                        - An externally removable PEG placement was                         successfully completed.                        - No specimens  collected. Recommendation:        - - Please follow the post-PEG recommendations                         including: may use PEG today for meds and water, flush                         PEG daily with 60 ml water and clean site with soap                         and water daily and dry thoroughly. May start using                         PEG for tube feedings in four hours. Please remove                         dressing tomorrow. Procedure Code(s):     --- Professional ---                        [phone], 65, Esophagogastroduodenoscopy, flexible,                         transoral; with directed placement of percutaneous                         gastrostomy tube Diagnosis Code(s):     --- Professional ---                        K44.9, Diaphragmatic hernia without obstruction or                         gangrene                        R29.818, Other symptoms and signs involving the                         nervous system                        R13.10, Dysphagia, unspecified                        Z43.1, Encounter for attention to gastrostomy CPT copyright 2019 [employer] Association. All rights reserved. The codes documented in this report are preliminary and  upon coder review may  be revised to meet current compliance requirements. Andrey Farmer MD, MD 12/13/2020 2:40:59 PM Eliseo Squires MD, MD Number of Addenda: 0 Note Initiated On: 12/13/2020 1:23 PM Estimated Blood Loss:  Estimated blood loss was minimal.      [facility]

## 2020-12-13 NOTE — Progress Notes (Signed)
NAME:  [name] [name], MRN:  [id number], DOB:  May 30, 1966, LOS: 98 ADMISSION DATE:  12/02/2020, CONSULTATION DATE:  12/02/2020 REFERRING MD:  Jodell Cipro, CHIEF COMPLAINT:  Cardiac arrest  Brief History:  [name] with acute cardiac arrest Labs c/w severe acidosis and severe DKA Patient with witnessed V fib arrest and shocked in the field ROSC returned is unknown  1/10 admitted to ICU for cardiac arrest, started hypothermia protocol 1/11 hypothermia protocol +posturing 1/12 signs of brain damage, needs NEURO consultation 1/13 failed SAT/SBT, unable to follow commands 1/14- for SBT today, electrolytes are improved. Encephalopathic working on mental status.  12/07/20- Patient remains crtically ill and is with interval worsening MRI brain, I met with husband to briefly review.  Discussed case with Neurologist today and there is plan for meeting with family.  Past Medical History:  Hypertension  Significant Hospital Events:  ETT 1/10>>   Consults:  Neurology Palliative Care ENT GI   Procedures:  ETT 1/10 CVL 1/11  Significant Diagnostic Tests:  12/07/2020 MRI Brain 1. Progressive DWI and FLAIR abnormality within the parietotemporal cortices and the bilateral corpus striatum, consistent with anoxic brain injury. 2. No hemorrhage or mass effect.  Echo 12/03/2020 1. Left ventricular ejection fraction, by estimation, is 60 to 65%. The  left ventricle has normal function. The left ventricle has no regional  wall motion abnormalities. There is mild left ventricular hypertrophy.  Left ventricular diastolic parameters  were normal.  2. Right ventricular systolic function is normal. The right ventricular  size is normal. Tricuspid regurgitation signal is inadequate for assessing  PA pressure.  3. A small pericardial effusion is present. The pericardial effusion is  posterior and lateral to the left ventricle.  4. The mitral valve is grossly normal. No evidence of mitral valve   regurgitation. No evidence of mitral stenosis.  5. The aortic valve is tricuspid. Aortic valve regurgitation is not  visualized. No aortic stenosis is present.  6. The inferior vena cava is normal in size with greater than 50%  respiratory variability, suggesting right atrial pressure of 3 mmHg.  EEG 12/04/2020 This study is suggestive of severe diffuse encephalopathy, nonspecific etiology. No seizures or epileptiform discharges were seen throughout the recording  Micro Data:  12/02/20 MRSA Screen Negative 12/02/20 Bld Cx negative  Antimicrobials:  Ceftriaxone 1/19  Interim History / Subjective:  No acute events overnight. Tracheostomy this morning, PEG to be done this afternoon.     Objective   Blood pressure (!) 204/129, pulse (!) 107, temperature 99 F (37.2 C), temperature source Axillary, resp. rate 18, height 5' 0.98" (1.549 m), weight 94.3 kg, SpO2 97 %.    Vent Mode: PRVC FiO2 (%):  [35 %] 35 % Set Rate:  [15 bmp] 15 bmp Vt Set:  [500 mL] 500 mL PEEP:  [5 cmH20] 5 cmH20   Intake/Output Summary (Last 24 hours) at 12/13/2020 1639 Last data filed at 12/13/2020 1544 Gross per 24 hour  Intake 1357.21 ml  Output 2960 ml  Net -1602.79 ml   Filed Weights   12/07/20 0455 12/08/20 0500 12/11/20 0500  Weight: 94.4 kg 94.3 kg 94.3 kg    Examination: General: Intubated, no acute distress HENT: [place]/AT, tracheostomy in place, sclera anicteric, PERRL Lungs: Course respirations on right.  Cardiovascular: RRR, s1s2, no murmur Abdomen: soft, non-distended, BS+ Extremities: warm, no edema Neuro: awake, opens eyes to verbal stimuli. Does not follow commands. Gag +, corneal reflex + GU: foley in place  Scheduled Meds: . sodium chloride  Intravenous Once  . amLODipine  10 mg Per Tube Daily  . chlorhexidine gluconate (MEDLINE KIT)  15 mL Mouth Rinse BID  . Chlorhexidine Gluconate Cloth  6 each Topical Daily  . feeding supplement (PROSource TF)  45 mL Per Tube BID  . feeding  supplement (VITAL AF 1.2 CAL)  1,000 mL Per Tube Q24H  . insulin aspart  0-15 Units Subcutaneous Q4H  . mouth rinse  15 mL Mouth Rinse 10 times per day  . multivitamin with minerals  1 tablet Per Tube Daily  . pantoprazole (PROTONIX) IV  40 mg Intravenous QHS  . polyethylene glycol  17 g Oral Daily  . senna-docusate  1 tablet Oral Daily  . sodium chloride flush  10-40 mL Intracatheter Q12H   Continuous Infusions: . sodium chloride 250 mL (12/08/20 1341)  . ampicillin-sulbactam (UNASYN) IV    . dexmedetomidine (PRECEDEX) IV infusion 0.4 mcg/kg/hr (12/13/20 1544)  . fentaNYL infusion INTRAVENOUS 200 mcg/hr (12/13/20 1430)  . potassium chloride 10 mEq (12/13/20 1540)   PRN Meds:.acetaminophen, fentaNYL, hydrALAZINE, midazolam, sodium chloride flush   Resolved Hospital Problem list   Acute Kidney Injury  Assessment & Plan:   Severe Anoxic Brain Injury Persistent vegetative state In setting of cardiac arrest and prolonged down time - Neurology Following - Will continue sedation for comfort - Poor long term prognosis, neurology and palliative care following -Tracheostomy and peg tube placement today - Consult for transition of care  Cardiac Arrest -s/p hypothermia protocol - Echo with normal EF and no wall motion abnormalities - Continue telemetry monitoring, EKG 1/19 showing normal sinus rhythm with polymorphic premature ventricular contractions. No ischemic changes  Acute Hypoxemic Respiratory Failure In setting of cardiac arrest and anoxic brain injury - Requiring minimal vent settings at this time - Status post tracheostomy, start weaning/trach collar trials as tolerates - Status post tracheostomy today - VAP bundle ordered - Sputum culture with abundant Gram positive rods, patient febrile, MRSA screen negative. - Ceftriaxone started 1/19 for concern of pneumonia,d/cd now on Unasyn see below - ENT consulted for tracheostomy   Hypernatremia -Resolved - Will hold free  water flushes today   Fever Leukocytosis - Tmax 100.9 in past 24hours - WBC 14, which has been increasing over the past couple days - Last cultured 1/10 - Sent UA, Blood cultures, Sputum 1/18 - Enterococcus UTI - MSSA in sputum - Unasyn  Hypokalemia Relative hypomagnesemia Replete  Anemia Some vaginal bleeding has been noted, but not a concerning amount at this time. Blood draws possibly contributing since hospitalization - Trend CBC  Best practice (evaluated daily)  Diet: TF Pain/Anxiety/Delirium protocol (if indicated): Precedex, Fentanyl, PRN versed VAP protocol (if indicated): Ordered DVT prophylaxis: heparin GI prophylaxis: PPI daily Glucose control: SSI Mobility: bed rest Disposition:ICU Code Status: Partial Discussed during ICU multidisciplinary rounds  Labs   CBC: Recent Labs  Lab 12/09/20 0254 12/09/20 1600 12/10/20 0415 12/11/20 0542 12/12/20 0916 12/13/20 0600  WBC 9.3 13.7* 14.3* 12.8* 18.5* 12.7*  NEUTROABS 6.6  --  10.8* 10.4* 15.2* 10.0*  HGB 6.9* 8.3* 8.6* 7.5* 8.5* 8.0*  HCT 23.1* 27.5* 28.4* 24.0* 28.5* 25.9*  MCV 82.8 81.4 81.1 82.5 81.7 83.0  PLT 187 204 214 202 229 021    Basic Metabolic Panel: Recent Labs  Lab 12/09/20 0254 12/10/20 0415 12/11/20 0542 12/12/20 0657 12/13/20 0600  NA 150* 147* 143 142  144 142  K 3.9 3.6 3.5 3.4*  3.4* 3.1*  CL 115* 111 107 107  109 100  CO2 _0 GLUCOSE 137* 154* 173* 157*  156* 146*  BUN 34* 22* 28* 33*  32* 26*  CREATININE 0.69 0.66 0.73 0.82  0.75 0.65  CALCIUM 8.3* 8.2* 8.1* 8.3*  8.3* 8.4*  MG 2.1 2.0 2.1 2.0 1.9  PHOS 4.2 3.9 3.7 3.7  3.7 3.4   GFR: Estimated Creatinine Clearance: 84.3 mL/min (by C-G formula based on SCr of 0.65 mg/dL). Recent Labs  Lab 12/10/20 0415 12/11/20 0542 12/12/20 0916 12/13/20 0600  WBC 14.3* 12.8* 18.5* 12.7*    Liver Function Tests: Recent Labs  Lab 12/10/20 0415 12/11/20 0542 12/12/20 0657 12/13/20 0600  ALBUMIN  2.3* 2.1* 2.0* 2.0*   No results for input(s): LIPASE, AMYLASE in the last 168 hours. No results for input(s): AMMONIA in the last 168 hours.  ABG    Component Value Date/Time   PHART 7.35 12/02/2020 0933   PCO2ART 47 12/02/2020 0933   PO2ART 70 (L) 12/02/2020 0933   HCO3 25.9 12/02/2020 0933   O2SAT 92.9 12/02/2020 0933     Coagulation Profile: No results for input(s): INR, PROTIME in the last 168 hours.  Cardiac Enzymes: No results for input(s): CKTOTAL, CKMB, CKMBINDEX, TROPONINI in the last 168 hours.  HbA1C: Hgb A1c MFr Bld  Date/Time Value Ref Range Status  12/02/2020 09:36 AM 5.9 (H) 4.8 - 5.6 % Final    Comment:    (NOTE) Pre diabetes:          5.7%-6.4%  Diabetes:              >6.4%  Glycemic control for   <7.0% adults with diabetes     CBG: Recent Labs  Lab 12/12/20 2056 12/12/20 2352 12/13/20 0407 12/13/20 1106 12/13/20 1546  GLUCAP 185* 164* 147* 128* 149*      Critical care time: 40 minutes     C. Derrill Kay, MD [place] [facility]   *This note was dictated using voice recognition software/Dragon.  Despite best efforts to proofread, errors can occur which can change the meaning.  Any change was purely unintentional.

## 2020-12-13 NOTE — Op Note (Signed)
12/13/2020  8:31 AM    [name] [name]  [id number]   Pre-Op Diagnosis:  Respiratory Failure  Post-op Diagnosis: Respiratory Failure  Procedure: Elective Tracheostomy  Surgeon:  [name]  Assistant: none  Anesthesia:  General endotracheal anesthesia  EBL:  Less than 10 cc  Complications:  None  Findings: None  Procedure: The patient was taken to the Operating Room from the CCU, already intubated, and placed in the supine position.  After induction of general anesthesia, the patient was placed on a shoulder roll with the neck extended. The skin was injected along the proposed incision line over the trachea with 1% lidocaine with epinephrine, 1:100,000. The area was then prepped and draped in the usual sterile fashion.  A 15 blade was then used to incise the skin in a horizontal incision over the trachea. The dissection was carried down to the subcutaneous tissues and through the platysma with the Bovie. Anterior jugular veins were clamped, suture ligated, and divided with the harmonic scalpel for hemostasis. The strap muscles were divided in the midline and retracted laterally. The thyroid isthmus was exposed and divided in the midline over the trachea with the Harmonic scalpel and dissected away from the anterior aspect of the trachea with the Bovie. With hemostasis obtained, the anesthesiologist was alerted that the airway was about to be entered so that the oxygen concentration could be lowered to reduce fire risk. The scrub tech prepared the tracheostomy tube, confirming no leak in the balloon cuff. The trachea was then incised between the 2nd and third tracheal rings, and an inferiorly based tracheal flap created by cutting through the third tracheal ring laterally. This was sutured up to the skin with a 3-0 Vicryl suture to help create a tracheocutaneous tract. The airway was suctioned and, after the anesthesiologist pulled the endotracheal tube back,  a #6 Shiley tracheostomy  tube was inserted into the tracheal lumen. The inner cannula was placed, the cuff inflated,  and the patient hooked to the anesthesia circuit for ventilation. CO2 return and adequate ventilation was confirmed with the anesthesiologist. The trach tube was further suctioned, using saline to loosen and clear secretions. Hemostasis was confirmed, using Surgicel to manage minor oozing, and the flange of the tracheostomy tube was sutured to the skin with 3-0 ethilon suture. A trach tie was placed around the neck to further secure the tracheostomy tube. Betadine soaked gauze was placed around the wound.  The patient was then returned to the anesthesiologist and taken to the CCU in stable condition.  Disposition:   Return to the CCU  Plan: Routine trach care and suctioning each shift and PRN. Vent settings per CCU admitting physician. Sutures can be removed in a week.  [name] 12/13/2020 8:31 AM

## 2020-12-13 NOTE — Care Plan (Signed)
PEG tube successfully placed. May use PEG tube immediately for free water flushes and meds. Can start tube feeds in 4 hours. Will need to remove dressing tomorrow.  Raylene Miyamoto MD, MPH [place]

## 2020-12-13 NOTE — Progress Notes (Signed)
[place] for Electrolyte Monitoring and Replacement   Recent Labs: Potassium (mmol/L)  Date Value  12/13/2020 3.1 (L)  02/20/2013 3.4 (L)   Magnesium (mg/dL)  Date Value  12/13/2020 1.9   Calcium (mg/dL)  Date Value  12/13/2020 8.4 (L)   Calcium, Total (mg/dL)  Date Value  02/20/2013 8.7   Albumin (g/dL)  Date Value  12/13/2020 2.0 (L)  02/20/2013 3.2 (L)   Phosphorus (mg/dL)  Date Value  12/13/2020 3.4   Sodium (mmol/L)  Date Value  12/13/2020 142  02/20/2013 137     Assessment: 55 year old female presented to the ED via EMS post witnessed cardiac arrest. ROSC achieved following 3 defibrillations and one round of epinephrine. Patient found to have significant hypokalemia on arrival. Given witnessed arrest, patient Code ICE. Pharmacy to manage electrolytes.     Goal of Therapy:  Electrolytes WNL, K ~ 4 and Mg ~ 2 given cardiac events  Plan:  Trach placed this morning. Plan for PEG today. Will have to replace via IV today until PEG tube placed and okay to use. Patient to receive Lasix 20 mg IV x 1. Will give potassium 10 mEq IV x 6 runs and magnesium 2 g IV x 1. Follow up with morning labs.  Tawnya Crook, PharmD 12/13/2020 12:16 PM

## 2020-12-13 NOTE — Transfer of Care (Signed)
Immediate Anesthesia Transfer of Care Note  Patient: [name] [name]  Procedure(s) Performed: TRACHEOSTOMY (N/A )  Patient Location: PACU and ICU  Anesthesia Type:General  Level of Consciousness: sedated  Airway & Oxygen Therapy: Patient placed on Ventilator (see vital sign flow sheet for setting)  Post-op Assessment: Report given to RN and Post -op Vital signs reviewed and stable  Post vital signs: Reviewed and stable  Last Vitals:  Vitals Value Taken Time  BP 143/93   Temp    Pulse 81   Resp 16   SpO2 97     Last Pain:  Vitals:   12/13/20 0400  TempSrc:   PainSc: 0-No pain         Complications: No complications documented.

## 2020-12-13 NOTE — Telephone Encounter (Signed)
Yeah agreed. Thanks Garner Nash, DO [place] [facility] 12/13/2020 6:21 AM

## 2020-12-13 NOTE — Anesthesia Procedure Notes (Signed)
Date/Time: 12/13/2020 8:33 AM Performed by: Lily Peer, Gaila Engebretsen, CRNA Pre-anesthesia Checklist: Patient identified, Emergency Drugs available, Suction available and Patient being monitored Patient Re-evaluated:Patient Re-evaluated prior to induction Oxygen Delivery Method: Circle system utilized Preoxygenation: Pre-oxygenation with 100% oxygen Induction Type: Inhalational induction with existing ETT

## 2020-12-13 NOTE — Progress Notes (Signed)
Brief Neuro Update:  Discussed with primary team today. Family pursuing full code and did not ask for our assistance to help them make that decision. At this time, we will signoff. Please feel free to contact us with any questions or concerns.  [place] Pager Number [phone]

## 2020-12-14 LAB — RENAL FUNCTION PANEL
Albumin: 2.2 g/dL — ABNORMAL LOW (ref 3.5–5.0)
Anion gap: 12 (ref 5–15)
BUN: 25 mg/dL — ABNORMAL HIGH (ref 6–20)
CO2: 31 mmol/L (ref 22–32)
Calcium: 8.6 mg/dL — ABNORMAL LOW (ref 8.9–10.3)
Chloride: 100 mmol/L (ref 98–111)
Creatinine, Ser: 0.69 mg/dL (ref 0.44–1.00)
GFR, Estimated: >60 mL/min (ref >60)
Glucose, Bld: 177 mg/dL — ABNORMAL HIGH (ref 70–99)
Phosphorus: 3.7 mg/dL (ref 2.5–4.6)
Potassium: 2.8 mmol/L — ABNORMAL LOW (ref 3.5–5.1)
Sodium: 143 mmol/L (ref 135–145)

## 2020-12-14 LAB — CBC WITH DIFFERENTIAL/PLATELET
Abs Immature Granulocytes: 0.11 10*3/uL — ABNORMAL HIGH (ref 0.00–0.07)
Basophils Absolute: 0 10*3/uL (ref 0.0–0.1)
Basophils Relative: 0 %
Eosinophils Absolute: 0.2 10*3/uL (ref 0.0–0.5)
Eosinophils Relative: 2 %
HCT: 28.2 % — ABNORMAL LOW (ref 36.0–46.0)
Hemoglobin: 8.4 g/dL — ABNORMAL LOW (ref 12.0–15.0)
Immature Granulocytes: 1 %
Lymphocytes Relative: 7 %
Lymphs Abs: 1.1 10*3/uL (ref 0.7–4.0)
MCH: 24.6 pg — ABNORMAL LOW (ref 26.0–34.0)
MCHC: 29.8 g/dL — ABNORMAL LOW (ref 30.0–36.0)
MCV: 82.7 fL (ref 80.0–100.0)
Monocytes Absolute: 0.6 10*3/uL (ref 0.1–1.0)
Monocytes Relative: 4 %
Neutro Abs: 13.2 10*3/uL — ABNORMAL HIGH (ref 1.7–7.7)
Neutrophils Relative %: 86 %
Platelets: 237 10*3/uL (ref 150–400)
RBC: 3.41 MIL/uL — ABNORMAL LOW (ref 3.87–5.11)
RDW: 19.5 % — ABNORMAL HIGH (ref 11.5–15.5)
WBC: 15.4 10*3/uL — ABNORMAL HIGH (ref 4.0–10.5)
nRBC: 0 % (ref 0.0–0.2)

## 2020-12-14 LAB — URINE CULTURE: Culture: >=100000 — AB

## 2020-12-14 LAB — GLUCOSE, CAPILLARY
Glucose-Capillary: 161 mg/dL — ABNORMAL HIGH (ref 70–99)
Glucose-Capillary: 162 mg/dL — ABNORMAL HIGH (ref 70–99)
Glucose-Capillary: 156 mg/dL — ABNORMAL HIGH (ref 70–99)
Glucose-Capillary: 171 mg/dL — ABNORMAL HIGH (ref 70–99)
Glucose-Capillary: 156 mg/dL — ABNORMAL HIGH (ref 70–99)
Glucose-Capillary: 205 mg/dL — ABNORMAL HIGH (ref 70–99)

## 2020-12-14 LAB — MAGNESIUM: Magnesium: 2.1 mg/dL (ref 1.7–2.4)

## 2020-12-14 LAB — PHOSPHORUS: Phosphorus: 3.8 mg/dL (ref 2.5–4.6)

## 2020-12-14 MED ORDER — PANTOPRAZOLE SODIUM 40 MG PO PACK
40.0000 mg | PACK | Freq: Every day | ORAL | Status: DC
  Administered 2020-12-14 – 2020-12-25 (×11): 40 mg
  Filled 2020-12-14 (×10): qty 20

## 2020-12-14 MED ORDER — POTASSIUM CHLORIDE 10 MEQ/100ML IV SOLN
10.0000 meq | INTRAVENOUS | Status: DC
  Filled 2020-12-14 (×3): qty 100

## 2020-12-14 MED ORDER — POTASSIUM CHLORIDE 10 MEQ/50ML IV SOLN
10.0000 meq | INTRAVENOUS | Status: AC
  Administered 2020-12-14 (×4): 10 meq via INTRAVENOUS
  Filled 2020-12-14 (×4): qty 50

## 2020-12-14 MED ORDER — MAGNESIUM SULFATE 2 GM/50ML IV SOLN
2.0000 g | Freq: Once | INTRAVENOUS | Status: AC
  Administered 2020-12-14: 2 g via INTRAVENOUS
  Filled 2020-12-14 (×2): qty 50

## 2020-12-14 MED ORDER — POTASSIUM CHLORIDE 20 MEQ PO PACK
40.0000 meq | PACK | Freq: Once | ORAL | Status: AC
  Administered 2020-12-14: 40 meq
  Filled 2020-12-14: qty 2

## 2020-12-14 NOTE — Progress Notes (Signed)
NAME:  [name] [name], MRN:  [id number], DOB:  August 21, 1966, LOS: 22 ADMISSION DATE:  12/02/2020, CONSULTATION DATE:  12/02/2020 REFERRING MD:  Jodell Cipro, CHIEF COMPLAINT:  Cardiac arrest  Brief History:  [name] with acute cardiac arrest Labs c/w severe acidosis and severe DKA Patient with witnessed V fib arrest and shocked in the field ROSC returned is unknown  1/10 admitted to ICU for cardiac arrest, started hypothermia protocol 1/11 hypothermia protocol +posturing 1/12 signs of brain damage, needs NEURO consultation 1/13 failed SAT/SBT, unable to follow commands 1/14- for SBT today, electrolytes are improved. Encephalopathic working on mental status.  12/07/20- Patient remains crtically ill and is with interval worsening MRI brain, I met with husband to briefly review.  Discussed case with Neurologist today and there is plan for meeting with family. 12/14/20- patient for LTAC placement s/p PEG/trache. No events today.  Past Medical History:  Hypertension  Significant Hospital Events:  ETT 1/10>>   Consults:  Neurology Palliative Care ENT GI   Procedures:  ETT 1/10 CVL 1/11  Significant Diagnostic Tests:  12/07/2020 MRI Brain 1. Progressive DWI and FLAIR abnormality within the parietotemporal cortices and the bilateral corpus striatum, consistent with anoxic brain injury. 2. No hemorrhage or mass effect.  Echo 12/03/2020 1. Left ventricular ejection fraction, by estimation, is 60 to 65%. The  left ventricle has normal function. The left ventricle has no regional  wall motion abnormalities. There is mild left ventricular hypertrophy.  Left ventricular diastolic parameters  were normal.  2. Right ventricular systolic function is normal. The right ventricular  size is normal. Tricuspid regurgitation signal is inadequate for assessing  PA pressure.  3. A small pericardial effusion is present. The pericardial effusion is  posterior and lateral to the left ventricle.   4. The mitral valve is grossly normal. No evidence of mitral valve  regurgitation. No evidence of mitral stenosis.  5. The aortic valve is tricuspid. Aortic valve regurgitation is not  visualized. No aortic stenosis is present.  6. The inferior vena cava is normal in size with greater than 50%  respiratory variability, suggesting right atrial pressure of 3 mmHg.  EEG 12/04/2020 This study is suggestive of severe diffuse encephalopathy, nonspecific etiology. No seizures or epileptiform discharges were seen throughout the recording  Micro Data:  12/02/20 MRSA Screen Negative 12/02/20 Bld Cx negative  Antimicrobials:  Ceftriaxone 1/19  Interim History / Subjective:  No acute events overnight. Tracheostomy this morning, PEG to be done this afternoon.     Objective   Blood pressure (!) 141/95, pulse (!) 103, temperature 99.2 F (37.3 C), temperature source Axillary, resp. rate 15, height 5' 0.98" (1.549 m), weight 94.3 kg, SpO2 99 %.    Vent Mode: PRVC FiO2 (%):  [35 %] 35 % Set Rate:  [15 bmp] 15 bmp Vt Set:  [500 mL] 500 mL PEEP:  [5 cmH20] 5 cmH20   Intake/Output Summary (Last 24 hours) at 12/14/2020 0749 Last data filed at 12/14/2020 0600 Gross per 24 hour  Intake 2568.03 ml  Output 2360 ml  Net 208.03 ml   Filed Weights   12/07/20 0455 12/08/20 0500 12/11/20 0500  Weight: 94.4 kg 94.3 kg 94.3 kg    Examination: General: Intubated, no acute distress HENT: [place]/AT, tracheostomy in place, sclera anicteric, PERRL Lungs: Course respirations on right.  Cardiovascular: RRR, s1s2, no murmur Abdomen: soft, non-distended, BS+ Extremities: warm, no edema Neuro: awake, opens eyes to verbal stimuli. Does not follow commands. Gag +, corneal reflex +  GU: foley in place  Scheduled Meds: . sodium chloride   Intravenous Once  . amLODipine  10 mg Per Tube Daily  . chlorhexidine gluconate (MEDLINE KIT)  15 mL Mouth Rinse BID  . Chlorhexidine Gluconate Cloth  6 each Topical Daily   . feeding supplement (PROSource TF)  45 mL Per Tube BID  . feeding supplement (VITAL AF 1.2 CAL)  1,000 mL Per Tube Q24H  . insulin aspart  0-15 Units Subcutaneous Q4H  . mouth rinse  15 mL Mouth Rinse 10 times per day  . multivitamin with minerals  1 tablet Per Tube Daily  . pantoprazole (PROTONIX) IV  40 mg Intravenous QHS  . polyethylene glycol  17 g Oral Daily  . potassium chloride  40 mEq Per Tube Once  . senna-docusate  1 tablet Oral Daily  . sodium chloride flush  10-40 mL Intracatheter Q12H   Continuous Infusions: . sodium chloride 250 mL (12/08/20 1341)  . ampicillin-sulbactam (UNASYN) IV 3 g (12/14/20 0522)  . dexmedetomidine (PRECEDEX) IV infusion 0.6 mcg/kg/hr (12/14/20 0102)  . fentaNYL infusion INTRAVENOUS 250 mcg/hr (12/14/20 0649)  . potassium chloride     PRN Meds:.acetaminophen, fentaNYL, hydrALAZINE, midazolam, sodium chloride flush   Resolved Hospital Problem list   Acute Kidney Injury  Assessment & Plan:   Severe Anoxic Brain Injury Persistent vegetative state In setting of cardiac arrest and prolonged down time - Neurology Following - Will continue sedation for comfort - Poor long term prognosis, neurology and palliative care following -Tracheostomy and peg tube placement today - Consult for transition of care  Cardiac Arrest -s/p hypothermia protocol - Echo with normal EF and no wall motion abnormalities - Continue telemetry monitoring, EKG 1/19 showing normal sinus rhythm with polymorphic premature ventricular contractions. No ischemic changes  Acute Hypoxemic Respiratory Failure In setting of cardiac arrest and anoxic brain injury - Requiring minimal vent settings at this time - Status post tracheostomy, start weaning/trach collar trials as tolerates - Status post tracheostomy today - VAP bundle ordered - Sputum culture with abundant Gram positive rods, patient febrile, MRSA screen negative. - Ceftriaxone started 1/19 for concern of  pneumonia,d/cd now on Unasyn see below - ENT consulted for tracheostomy   Hypernatremia -Resolved - Will hold free water flushes today   Fever Leukocytosis - Tmax 100.9 in past 24hours - WBC 14, which has been increasing over the past couple days - Last cultured 1/10 - Sent UA, Blood cultures, Sputum 1/18 - Enterococcus UTI - MSSA in sputum - Unasyn  Hypokalemia Relative hypomagnesemia Replete  Anemia Some vaginal bleeding has been noted, but not a concerning amount at this time. Blood draws possibly contributing since hospitalization - Trend CBC  Best practice (evaluated daily)  Diet: TF Pain/Anxiety/Delirium protocol (if indicated): Precedex, Fentanyl, PRN versed VAP protocol (if indicated): Ordered DVT prophylaxis: heparin GI prophylaxis: PPI daily Glucose control: SSI Mobility: bed rest Disposition:ICU Code Status: Partial Discussed during ICU multidisciplinary rounds  Labs   CBC: Recent Labs  Lab 12/10/20 0415 12/11/20 0542 12/12/20 0916 12/13/20 0600 12/14/20 0445  WBC 14.3* 12.8* 18.5* 12.7* 15.4*  NEUTROABS 10.8* 10.4* 15.2* 10.0* 13.2*  HGB 8.6* 7.5* 8.5* 8.0* 8.4*  HCT 28.4* 24.0* 28.5* 25.9* 28.2*  MCV 81.1 82.5 81.7 83.0 82.7  PLT 214 202 229 206 680    Basic Metabolic Panel: Recent Labs  Lab 12/10/20 0415 12/11/20 0542 12/12/20 0657 12/13/20 0600 12/14/20 0445  NA 147* 143 142  144 142 143  K 3.6 3.5 3.4*  3.4* 3.1* 2.8*  CL 111 107 107  109 100 100  CO2 _0 GLUCOSE 154* 173* 157*  156* 146* 177*  BUN 22* 28* 33*  32* 26* 25*  CREATININE 0.66 0.73 0.82  0.75 0.65 0.69  CALCIUM 8.2* 8.1* 8.3*  8.3* 8.4* 8.6*  MG 2.0 2.1 2.0 1.9 2.1  PHOS 3.9 3.7 3.7  3.7 3.4 3.7  3.8   GFR: Estimated Creatinine Clearance: 84.3 mL/min (by C-G formula based on SCr of 0.69 mg/dL). Recent Labs  Lab 12/11/20 0542 12/12/20 0916 12/13/20 0600 12/14/20 0445  WBC 12.8* 18.5* 12.7* 15.4*    Liver Function Tests: Recent  Labs  Lab 12/10/20 0415 12/11/20 0542 12/12/20 0657 12/13/20 0600 12/14/20 0445  ALBUMIN 2.3* 2.1* 2.0* 2.0* 2.2*   No results for input(s): LIPASE, AMYLASE in the last 168 hours. No results for input(s): AMMONIA in the last 168 hours.  ABG    Component Value Date/Time   PHART 7.35 12/02/2020 0933   PCO2ART 47 12/02/2020 0933   PO2ART 70 (L) 12/02/2020 0933   HCO3 25.9 12/02/2020 0933   O2SAT 92.9 12/02/2020 0933     Coagulation Profile: No results for input(s): INR, PROTIME in the last 168 hours.  Cardiac Enzymes: No results for input(s): CKTOTAL, CKMB, CKMBINDEX, TROPONINI in the last 168 hours.  HbA1C: Hgb A1c MFr Bld  Date/Time Value Ref Range Status  12/02/2020 09:36 AM 5.9 (H) 4.8 - 5.6 % Final    Comment:    (NOTE) Pre diabetes:          5.7%-6.4%  Diabetes:              >6.4%  Glycemic control for   <7.0% adults with diabetes     CBG: Recent Labs  Lab 12/13/20 1546 12/13/20 2136 12/13/20 2324 12/14/20 0425 12/14/20 0732  GLUCAP 149* 164* 164* 161* 162*      Critical care time: 33 minutes     Critical care provider statement:    Critical care time (minutes):  33   Critical care time was exclusive of:  Separately billable procedures and  treating other patients   Critical care was necessary to treat or prevent imminent or  life-threatening deterioration of the following conditions:  anoxic brain injury, acute hypoxemic respiratory faiulre   Critical care was time spent personally by me on the following  activities:  Development of treatment plan with patient or surrogate,  discussions with consultants, evaluation of patient's response to  treatment, examination of patient, obtaining history from patient or  surrogate, ordering and performing treatments and interventions, ordering  and review of laboratory studies and re-evaluation of patient's condition   I assumed direction of critical care for this patient from another  provider in my  specialty: no     *This note was dictated using voice recognition software/Dragon.  Despite best efforts to proofread, errors can occur which can change the meaning.  Any change was purely unintentional.    Ottie Glazier, M.D.  [facility] [place]

## 2020-12-14 NOTE — Progress Notes (Signed)
[facility] for Electrolyte Monitoring and Replacement   Recent Labs: Potassium (mmol/L)  Date Value  12/14/2020 2.8 (L)  02/20/2013 3.4 (L)   Magnesium (mg/dL)  Date Value  12/14/2020 2.1   Calcium (mg/dL)  Date Value  12/14/2020 8.6 (L)   Calcium, Total (mg/dL)  Date Value  02/20/2013 8.7   Albumin (g/dL)  Date Value  12/14/2020 2.2 (L)  02/20/2013 3.2 (L)   Phosphorus (mg/dL)  Date Value  12/14/2020 3.8  12/14/2020 3.7   Sodium (mmol/L)  Date Value  12/14/2020 143  02/20/2013 137   Corrected Ca: 10.0 mg/dL  Assessment: 55 year old female presented to the ED via EMS post witnessed cardiac arrest. ROSC achieved following 3 defibrillations and one round of epinephrine. Patient found to have significant hypokalemia on arrival. Given witnessed arrest, patient Code ICE. Pharmacy to manage electrolytes.    Nutrition: ProSource BID + Vital 40 mL/hr  Goal of Therapy:  Potassium 4.0 - 5.1 mmol/L Magnesium 2.1 - 2.4 mg/dL All Other Electrolytes WNL  Plan:   IV KCl 10 mEq IV x 4 per provider  40 mEq KCl per tube x 1 per provider   Follow up with morning labs  Dallie Piles, PharmD 12/14/2020 9:00 AM

## 2020-12-14 NOTE — Progress Notes (Addendum)
More alert as the day progressed. Facial expressions noted. Lots of tracheal secretions. Husband very attentive. Peg site clean and dry. Magnesium never sent from pharmacy-requested multiple times from pharmacy.

## 2020-12-14 NOTE — Anesthesia Postprocedure Evaluation (Signed)
Anesthesia Post Note  Patient: [name] [name]  Procedure(s) Performed: TRACHEOSTOMY (N/A )  Patient location during evaluation: ICU Anesthesia Type: General Level of consciousness: awake Pain management: pain level controlled Vital Signs Assessment: post-procedure vital signs reviewed and stable Respiratory status: patient on ventilator - see flowsheet for VS Cardiovascular status: blood pressure returned to baseline and stable Postop Assessment: no apparent nausea or vomiting Anesthetic complications: no   No complications documented.   Last Vitals:  Vitals:   12/14/20 0715 12/14/20 0949  BP:  (!) 153/100  Pulse:    Resp:    Temp:    SpO2: 100%     Last Pain:  Vitals:   12/14/20 0400  TempSrc: Axillary  PainSc: 0-No pain                 Brett Canales Claudie Brickhouse

## 2020-12-15 LAB — RENAL FUNCTION PANEL
Albumin: 2.4 g/dL — ABNORMAL LOW (ref 3.5–5.0)
Anion gap: 16 — ABNORMAL HIGH (ref 5–15)
BUN: 26 mg/dL — ABNORMAL HIGH (ref 6–20)
CO2: 28 mmol/L (ref 22–32)
Calcium: 8.8 mg/dL — ABNORMAL LOW (ref 8.9–10.3)
Chloride: 101 mmol/L (ref 98–111)
Creatinine, Ser: 0.63 mg/dL (ref 0.44–1.00)
GFR, Estimated: >60 mL/min (ref >60)
Glucose, Bld: 173 mg/dL — ABNORMAL HIGH (ref 70–99)
Phosphorus: 3 mg/dL (ref 2.5–4.6)
Potassium: 2.4 mmol/L — CL (ref 3.5–5.1)
Sodium: 145 mmol/L (ref 135–145)

## 2020-12-15 LAB — CBC WITH DIFFERENTIAL/PLATELET
Abs Immature Granulocytes: 0.08 10*3/uL — ABNORMAL HIGH (ref 0.00–0.07)
Basophils Absolute: 0.1 10*3/uL (ref 0.0–0.1)
Basophils Relative: 0 %
Eosinophils Absolute: 0.5 10*3/uL (ref 0.0–0.5)
Eosinophils Relative: 3 %
HCT: 29.4 % — ABNORMAL LOW (ref 36.0–46.0)
Hemoglobin: 8.6 g/dL — ABNORMAL LOW (ref 12.0–15.0)
Immature Granulocytes: 1 %
Lymphocytes Relative: 10 %
Lymphs Abs: 1.5 10*3/uL (ref 0.7–4.0)
MCH: 24.4 pg — ABNORMAL LOW (ref 26.0–34.0)
MCHC: 29.3 g/dL — ABNORMAL LOW (ref 30.0–36.0)
MCV: 83.3 fL (ref 80.0–100.0)
Monocytes Absolute: 1 10*3/uL (ref 0.1–1.0)
Monocytes Relative: 7 %
Neutro Abs: 12.5 10*3/uL — ABNORMAL HIGH (ref 1.7–7.7)
Neutrophils Relative %: 79 %
Platelets: 246 10*3/uL (ref 150–400)
RBC: 3.53 MIL/uL — ABNORMAL LOW (ref 3.87–5.11)
RDW: 19.6 % — ABNORMAL HIGH (ref 11.5–15.5)
WBC: 15.7 10*3/uL — ABNORMAL HIGH (ref 4.0–10.5)
nRBC: 0 % (ref 0.0–0.2)

## 2020-12-15 LAB — CULTURE, BLOOD (ROUTINE X 2)
Culture: NO GROWTH
Culture: NO GROWTH
Special Requests: ADEQUATE
Special Requests: ADEQUATE

## 2020-12-15 LAB — GLUCOSE, CAPILLARY
Glucose-Capillary: 139 mg/dL — ABNORMAL HIGH (ref 70–99)
Glucose-Capillary: 174 mg/dL — ABNORMAL HIGH (ref 70–99)
Glucose-Capillary: 185 mg/dL — ABNORMAL HIGH (ref 70–99)
Glucose-Capillary: 167 mg/dL — ABNORMAL HIGH (ref 70–99)
Glucose-Capillary: 172 mg/dL — ABNORMAL HIGH (ref 70–99)
Glucose-Capillary: 153 mg/dL — ABNORMAL HIGH (ref 70–99)

## 2020-12-15 LAB — MAGNESIUM: Magnesium: 1.9 mg/dL (ref 1.7–2.4)

## 2020-12-15 LAB — POTASSIUM: Potassium: 2.9 mmol/L — ABNORMAL LOW (ref 3.5–5.1)

## 2020-12-15 MED ORDER — POTASSIUM CHLORIDE 10 MEQ/50ML IV SOLN
10.0000 meq | INTRAVENOUS | Status: AC
  Administered 2020-12-15 (×3): 10 meq via INTRAVENOUS
  Filled 2020-12-15 (×4): qty 50

## 2020-12-15 MED ORDER — POTASSIUM CHLORIDE 10 MEQ/50ML IV SOLN
10.0000 meq | INTRAVENOUS | Status: AC
Start: 2020-12-15 — End: 2020-12-15
  Administered 2020-12-15 (×4): 10 meq via INTRAVENOUS
  Filled 2020-12-15 (×4): qty 50

## 2020-12-15 MED ORDER — MAGNESIUM SULFATE 2 GM/50ML IV SOLN
2.0000 g | Freq: Once | INTRAVENOUS | Status: AC
  Administered 2020-12-15: 2 g via INTRAVENOUS
  Filled 2020-12-15: qty 50

## 2020-12-15 MED ORDER — POTASSIUM CHLORIDE 20 MEQ PO PACK
40.0000 meq | PACK | Freq: Once | ORAL | Status: AC
  Administered 2020-12-15: 40 meq
  Filled 2020-12-15: qty 2

## 2020-12-15 NOTE — Progress Notes (Signed)
[facility] for Electrolyte Monitoring and Replacement   Recent Labs: Potassium (mmol/L)  Date Value  12/15/2020 2.9 (L)  02/20/2013 3.4 (L)   Magnesium (mg/dL)  Date Value  12/15/2020 1.9   Calcium (mg/dL)  Date Value  12/15/2020 8.8 (L)   Calcium, Total (mg/dL)  Date Value  02/20/2013 8.7   Albumin (g/dL)  Date Value  12/15/2020 2.4 (L)  02/20/2013 3.2 (L)   Phosphorus (mg/dL)  Date Value  12/15/2020 3.0   Sodium (mmol/L)  Date Value  12/15/2020 145  02/20/2013 137   Corrected Ca: 10.0 mg/dL  Assessment: 55 year old female presented to the ED via EMS post witnessed cardiac arrest. ROSC achieved following 3 defibrillations and one round of epinephrine. Patient found to have significant hypokalemia on arrival. Given witnessed arrest, patient Code ICE. Pharmacy to manage electrolytes.  This morning she received 40 mEq KCl per tube x 1 and 40 mEq IV KCl and potassium improved from 2.4 to 2.9 mmol/L, although the last IV KCl dose was still infusing when lab was drawn  Nutrition: ProSource BID + Vital 40 mL/hr  Goal of Therapy:  Potassium 4.0 - 5.1 mmol/L Magnesium 2.1 - 2.4 mg/dL All Other Electrolytes WNL  Plan:   Repeat IV KCl 10 mEq IV x 4   Repeat 40 mEq KCl per tube x 1    Follow up with morning labs  Dallie Piles, PharmD 12/15/2020 3:29 PM

## 2020-12-15 NOTE — Progress Notes (Signed)
K+ is critically low at 2.4, notified NP of result.

## 2020-12-15 NOTE — Progress Notes (Signed)
CRITICAL CARE NOTE [name] with acute cardiac arrest Labs c/w severe acidosis and severe DKA Patient with witnessed V fib arrest and shocked in the field ROSC returned is unknown  1/10 admitted to ICU for cardiac arrest, started hypothermia protocol 1/11 hypothermia protocol +posturing 1/12 signs of brain damage, needs NEURO consultation 1/13 failed SAT/SBT, unable to follow commands 1/14- for SBT today, electrolytes are improved. Encephalopathic working on mental status. 12/07/20-Patient remains crtically ill and is with interval worsening MRI brain, I met with husband to briefly review. Discussed case with Neurologist today and there is plan for meeting with family. 12/14/20- patient for LTAC placement s/p PEG/trache. No events today. 1/23 severe brain damage  /15/2022 MRI Brain 1. Progressive DWI and FLAIR abnormality within the parietotemporal cortices and the bilateral corpus striatum, consistent with anoxic brain injury. 2. No hemorrhage or mass effect.  Echo 12/03/2020 1. Left ventricular ejection fraction, by estimation, is 60 to 65%. The  left ventricle has normal function. The left ventricle has no regional  wall motion abnormalities. There is mild left ventricular hypertrophy.  Left ventricular diastolic parameters  were normal.  2. Right ventricular systolic function is normal. The right ventricular  size is normal. Tricuspid regurgitation signal is inadequate for assessing  PA pressure.  3. A small pericardial effusion is present. The pericardial effusion is  posterior and lateral to the left ventricle.  4. The mitral valve is grossly normal. No evidence of mitral valve  regurgitation. No evidence of mitral stenosis.  5. The aortic valve is tricuspid. Aortic valve regurgitation is not  visualized. No aortic stenosis is present.  6. The inferior vena cava is normal in size with greater than 50%  respiratory variability, suggesting right atrial pressure of 3  mmHg.  EEG 12/04/2020 This study issuggestive of severe diffuse encephalopathy, nonspecific etiology.No seizures or epileptiform discharges were seen throughout the recording  Micro Data:  12/02/20 MRSA Screen Negative 12/02/20 Bld Cx negative       CC  follow up respiratory failure  SUBJECTIVE Patient remains critically ill Prognosis is guarded   BP (!) 190/77   Pulse (!) 125   Temp 98.9 F (37.2 C) (Oral)   Resp (!) 27   Ht 5' 0.98" (1.549 m)   Wt 94.3 kg   SpO2 100%   BMI 39.30 kg/m    I/O last 3 completed shifts: In: 1340.3 [I.V.:220.3; NG/GT:920; IV Piggyback:200] Out: 1025 [Urine:1450] No intake/output data recorded.  SpO2: 100 % FiO2 (%): 35 %  Estimated body mass index is 39.3 kg/m as calculated from the following:   Height as of this encounter: 5' 0.98" (1.549 m).   Weight as of this encounter: 94.3 kg.  SIGNIFICANT EVENTS   REVIEW OF SYSTEMS  PATIENT IS UNABLE TO PROVIDE COMPLETE REVIEW OF SYSTEMS DUE TO SEVERE CRITICAL ILLNESS        PHYSICAL EXAMINATION:  GENERAL:critically ill appearing, +resp distress HEAD: Normocephalic, atraumatic.  EYES: Pupils equal, round, reactive to light.  No scleral icterus.  MOUTH: Moist mucosal membrane. NECK: Supple. S/p trach PULMONARY: +rhonchi, +wheezing CARDIOVASCULAR: S1 and S2. Regular rate and rhythm. No murmurs, rubs, or gallops.  GASTROINTESTINAL: Soft, nontender, -distended.  Positive bowel sounds.   MUSCULOSKELETAL: No swelling, clubbing, or edema.  NEUROLOGIC: obtunded, GCS<8 SKIN:intact,warm,dry  MEDICATIONS: I have reviewed all medications and confirmed regimen as documented   CULTURE RESULTS   Recent Results (from the past 240 hour(s))  Culture, blood (routine x 2)     Status: None  Collection Time: 12/10/20 11:28 AM   Specimen: BLOOD  Result Value Ref Range Status   Specimen Description BLOOD LINE  Final   Special Requests   Final    BOTTLES DRAWN AEROBIC AND ANAEROBIC  Blood Culture adequate volume   Culture   Final    NO GROWTH 5 DAYS Performed at [facility], [address], [place], [place] [address]    Report Status 12/15/2020 FINAL  Final  Culture, blood (routine x 2)     Status: None   Collection Time: 12/10/20 11:38 AM   Specimen: BLOOD RIGHT HAND  Result Value Ref Range Status   Specimen Description BLOOD RIGHT HAND  Final   Special Requests   Final    BOTTLES DRAWN AEROBIC AND ANAEROBIC Blood Culture adequate volume   Culture   Final    NO GROWTH 5 DAYS Performed at [facility], [address] [place], [place] [address]    Report Status 12/15/2020 FINAL  Final  Culture, respiratory (non-expectorated)     Status: None   Collection Time: 12/10/20  2:12 PM   Specimen: Tracheal Aspirate; Respiratory  Result Value Ref Range Status   Specimen Description   Final    TRACHEAL ASPIRATE Performed at [facility], [address] [place], [place] [address]    Special Requests   Final    NONE Performed at [facility], [place], [place] [address]    Gram Stain   Final    RARE WBC PRESENT, PREDOMINANTLY PMN ABUNDANT GRAM POSITIVE COCCI FEW GRAM POSITIVE RODS RARE GRAM NEGATIVE RODS Performed at [facility], [place] [address] [place], [place] [address]    Culture ABUNDANT STAPHYLOCOCCUS AUREUS  Final   Report Status 12/13/2020 FINAL  Final   Organism ID, Bacteria STAPHYLOCOCCUS AUREUS  Final      Susceptibility   Staphylococcus aureus - MIC*    CIPROFLOXACIN <=0.5 SENSITIVE Sensitive     ERYTHROMYCIN <=0.25 SENSITIVE Sensitive     GENTAMICIN <=0.5 SENSITIVE Sensitive     OXACILLIN <=0.25 SENSITIVE Sensitive     TETRACYCLINE <=1 SENSITIVE Sensitive     VANCOMYCIN 1 SENSITIVE Sensitive     TRIMETH/SULFA <=10 SENSITIVE Sensitive     CLINDAMYCIN <=0.25 SENSITIVE Sensitive     RIFAMPIN <=0.5 SENSITIVE Sensitive     Inducible Clindamycin NEGATIVE Sensitive     * ABUNDANT  STAPHYLOCOCCUS AUREUS  Urine Culture     Status: Abnormal   Collection Time: 12/10/20  2:30 PM   Specimen: Urine, Random  Result Value Ref Range Status   Specimen Description   Final    URINE, RANDOM Performed at [facility], [address] [place], [place] [address]    Special Requests   Final    NONE Performed at [facility], [address] [place], [place] [address]    Culture 30,000 COLONIES/mL ENTEROCOCCUS FAECALIS (A)  Final   Report Status 12/13/2020 FINAL  Final   Organism ID, Bacteria ENTEROCOCCUS FAECALIS (A)  Final      Susceptibility   Enterococcus faecalis - MIC*    AMPICILLIN <=2 SENSITIVE Sensitive     NITROFURANTOIN <=16 SENSITIVE Sensitive     VANCOMYCIN 2 SENSITIVE Sensitive     * 30,000 COLONIES/mL ENTEROCOCCUS FAECALIS  Urine Culture     Status: Abnormal   Collection Time: 12/11/20  4:40 PM   Specimen: Urine, Random  Result Value Ref Range Status   Specimen Description   Final  URINE, RANDOM Performed at [facility], [address] [place], [place] [address]    Special Requests   Final    NONE Performed at [facility], [address] [place], [place] [address]    Culture >=100,000 COLONIES/mL ENTEROCOCCUS FAECALIS (A)  Final   Report Status 12/14/2020 FINAL  Final   Organism ID, Bacteria ENTEROCOCCUS FAECALIS (A)  Final      Susceptibility   Enterococcus faecalis - MIC*    AMPICILLIN <=2 SENSITIVE Sensitive     NITROFURANTOIN <=16 SENSITIVE Sensitive     VANCOMYCIN 2 SENSITIVE Sensitive     * >=100,000 COLONIES/mL ENTEROCOCCUS FAECALIS        BMP Latest Ref Rng & Units 12/15/2020 12/14/2020 12/13/2020  Glucose 70 - 99 mg/dL 173(H) 177(H) 146(H)  BUN 6 - 20 mg/dL 26(H) 25(H) 26(H)  Creatinine 0.44 - 1.00 mg/dL 0.63 0.69 0.65  Sodium 135 - 145 mmol/L 145 143 142  Potassium 3.5 - 5.1 mmol/L 2.4(LL) 2.8(L) 3.1(L)  Chloride 98 - 111 mmol/L 101 100 100  CO2 22 - 32 mmol/L 28 31 31   Calcium 8.9 - 10.3  mg/dL 8.8(L) 8.6(L) 8.4(L)     IMAGING    No results found.   Nutrition Status: Nutrition Problem: Inadequate oral intake Etiology: inability to eat Signs/Symptoms: NPO status Interventions: Tube feeding,Prostat,MVI     Indwelling Urinary Catheter continued, requirement due to   Reason to continue Indwelling Urinary Catheter strict Intake/Output monitoring for hemodynamic instability   Central Line/ continued, requirement due to  Reason to continue Brule of central venous pressure or other hemodynamic parameters and poor IV access   Ventilator continued, requirement due to severe respiratory failure   Ventilator Sedation RASS 0 to -2      ASSESSMENT AND PLAN SYNOPSIS  55 yo morbidly obese [name] with acute cardiac arrest with acute and severe hypoxic resp failure with severe brain damage s/p trach and PEG  Severe ACUTE Hypoxic and Hypercapnic Respiratory Failure -continue Full MV support -continue Bronchodilator Therapy -Wean Fio2 and PEEP as tolerated -will perform SAT/SBT when respiratory parameters are met -VAP/VENT bundle implementation   Morbid obesity, possible OSA.   Will certainly impact respiratory mechanics, ventilator weaning Suspect will need to consider additional PEEP   NEUROLOGY Severe brain damage Acute toxic metabolic encephalopathy  CARDIAC ICU monitoring  GI GI PROPHYLAXIS as indicated   DIET-->TF's as tolerated Constipation protocol as indicated  ENDO - will use ICU hypoglycemic\Hyperglycemia protocol if indicated     ELECTROLYTES -follow labs as needed -replace as needed -pharmacy consultation and following   DVT/GI PRX ordered and assessed TRANSFUSIONS AS NEEDED MONITOR FSBS I Assessed the need for Labs I Assessed the need for Foley I Assessed the need for Central Venous Line Family Discussion when available I Assessed the need for Mobilization I made an Assessment of medications to be adjusted  accordingly Safety Risk assessment completed   CASE DISCUSSED IN MULTIDISCIPLINARY ROUNDS WITH ICU TEAM  Critical Care Time devoted to patient care services described in this note is 45 minutes.   Overall, patient is critically ill, prognosis is guarded.     Corrin Parker, M.D.  [facility]  Medical Director Day Director [facility] Cardio-Pulmonary Department

## 2020-12-15 NOTE — Progress Notes (Signed)
Great day. Moving bilateral legs at 0800. Became more alert as the day progressed. Opened eyes and turned head when her name called. Tried to sit up several times. Husband visiting most of the day. Still unable to follow commands. Husband hopeful.

## 2020-12-16 ENCOUNTER — Encounter: Payer: Self-pay | Admitting: Gastroenterology

## 2020-12-16 LAB — GLUCOSE, CAPILLARY
Glucose-Capillary: 131 mg/dL — ABNORMAL HIGH (ref 70–99)
Glucose-Capillary: 149 mg/dL — ABNORMAL HIGH (ref 70–99)
Glucose-Capillary: 152 mg/dL — ABNORMAL HIGH (ref 70–99)
Glucose-Capillary: 158 mg/dL — ABNORMAL HIGH (ref 70–99)
Glucose-Capillary: 159 mg/dL — ABNORMAL HIGH (ref 70–99)
Glucose-Capillary: 179 mg/dL — ABNORMAL HIGH (ref 70–99)

## 2020-12-16 LAB — RENAL FUNCTION PANEL
Albumin: 2.4 g/dL — ABNORMAL LOW (ref 3.5–5.0)
Anion gap: 13 (ref 5–15)
BUN: 28 mg/dL — ABNORMAL HIGH (ref 6–20)
CO2: 27 mmol/L (ref 22–32)
Calcium: 8.9 mg/dL (ref 8.9–10.3)
Chloride: 106 mmol/L (ref 98–111)
Creatinine, Ser: 0.56 mg/dL (ref 0.44–1.00)
GFR, Estimated: >60 mL/min (ref >60)
Glucose, Bld: 140 mg/dL — ABNORMAL HIGH (ref 70–99)
Phosphorus: 3.8 mg/dL (ref 2.5–4.6)
Potassium: 3.5 mmol/L (ref 3.5–5.1)
Sodium: 146 mmol/L — ABNORMAL HIGH (ref 135–145)

## 2020-12-16 LAB — CBC WITH DIFFERENTIAL/PLATELET
Abs Immature Granulocytes: 0.07 10*3/uL (ref 0.00–0.07)
Basophils Absolute: 0 10*3/uL (ref 0.0–0.1)
Basophils Relative: 0 %
Eosinophils Absolute: 0.4 10*3/uL (ref 0.0–0.5)
Eosinophils Relative: 3 %
HCT: 28.5 % — ABNORMAL LOW (ref 36.0–46.0)
Hemoglobin: 8.4 g/dL — ABNORMAL LOW (ref 12.0–15.0)
Immature Granulocytes: 1 %
Lymphocytes Relative: 13 %
Lymphs Abs: 1.7 10*3/uL (ref 0.7–4.0)
MCH: 24.7 pg — ABNORMAL LOW (ref 26.0–34.0)
MCHC: 29.5 g/dL — ABNORMAL LOW (ref 30.0–36.0)
MCV: 83.8 fL (ref 80.0–100.0)
Monocytes Absolute: 0.9 10*3/uL (ref 0.1–1.0)
Monocytes Relative: 7 %
Neutro Abs: 9.8 10*3/uL — ABNORMAL HIGH (ref 1.7–7.7)
Neutrophils Relative %: 76 %
Platelets: 208 10*3/uL (ref 150–400)
RBC: 3.4 MIL/uL — ABNORMAL LOW (ref 3.87–5.11)
RDW: 19.8 % — ABNORMAL HIGH (ref 11.5–15.5)
WBC: 12.9 10*3/uL — ABNORMAL HIGH (ref 4.0–10.5)
nRBC: 0 % (ref 0.0–0.2)

## 2020-12-16 LAB — MAGNESIUM: Magnesium: 2.1 mg/dL (ref 1.7–2.4)

## 2020-12-16 MED ORDER — OXYCODONE HCL 5 MG PO TABS
10.0000 mg | ORAL_TABLET | Freq: Four times a day (QID) | ORAL | Status: DC
  Administered 2020-12-16 – 2020-12-30 (×55): 10 mg
  Filled 2020-12-16 (×55): qty 2

## 2020-12-16 MED ORDER — QUETIAPINE FUMARATE 25 MG PO TABS
50.0000 mg | ORAL_TABLET | Freq: Every day | ORAL | Status: DC
  Administered 2020-12-16: 50 mg
  Filled 2020-12-16: qty 2

## 2020-12-16 MED ORDER — QUETIAPINE FUMARATE 25 MG PO TABS
100.0000 mg | ORAL_TABLET | Freq: Every day | ORAL | Status: DC
  Administered 2020-12-16: 100 mg via ORAL
  Filled 2020-12-16: qty 4

## 2020-12-16 MED ORDER — POTASSIUM CHLORIDE 20 MEQ PO PACK
40.0000 meq | PACK | Freq: Once | ORAL | Status: AC
  Administered 2020-12-16: 40 meq
  Filled 2020-12-16: qty 2

## 2020-12-16 MED ORDER — QUETIAPINE FUMARATE 25 MG PO TABS
100.0000 mg | ORAL_TABLET | Freq: Every day | ORAL | Status: DC
  Administered 2020-12-17 – 2020-12-18 (×2): 100 mg
  Filled 2020-12-16 (×2): qty 4

## 2020-12-16 MED ORDER — ZIPRASIDONE MESYLATE 20 MG IM SOLR
20.0000 mg | Freq: Once | INTRAMUSCULAR | Status: AC
  Administered 2020-12-16: 20 mg via INTRAMUSCULAR
  Filled 2020-12-16: qty 20

## 2020-12-16 NOTE — Progress Notes (Signed)
Nutrition Follow-up  DOCUMENTATION CODES:   Obesity unspecified  INTERVENTION:  Continue Vital AF 1.2 Cal at 40 mL/hr (960 mL goal daily volume) + PROSource TF 45 mL BID per tube. Provides 1232 kcal, 94 grams of protein, 778 mL H2O daily.   Continue MVI daily per tube.  NUTRITION DIAGNOSIS:   Inadequate oral intake related to inability to eat as evidenced by NPO status.  Ongoing.  GOAL:   Patient will meet greater than or equal to 90% of their needs  Met with TF regimen.  MONITOR:   Vent status,Labs,Weight trends,TF tolerance,I & O's  REASON FOR ASSESSMENT:   Ventilator,Consult Enteral/tube feeding initiation and management  ASSESSMENT:   55 year old female with PMHx of HTN admitted after acute cardiac arrest also with DKA and severe acidosis.  1/10 intubated  1/21 s/p tracheostomy tube placement 1/21 s/p G-tube placement  Patient is currently intubated on ventilator support MV: 12.4 L/min Temp (24hrs), Avg:99.7 F (37.6 C), Min:97.7 F (36.5 C), Max:101.2 F (38.4 C)  Medications reviewed and include: Novolog 0-15 units Q4hrs, MVI daily, Protonix, Miralax, senna-docusate 1 tablet daily, Unasyn, Precedex gtt, fentanyl gtt.  Labs reviewed: CBG 131-172, Sodium 146, BUN 28.  I/O: 2400 mL UOP Yesterday (1.1 mL/kg/hr)  Weight trend: 94.3 kg on 1/19; +1.9 kg from 1/12  Enteral Access: 20 Fr. Endovive Safety G-tube  TF regimen: Vital AF 1.2 Cal at 40 mL/hr + PROSource TF 45 mL BID  Discussed on rounds. Plan to try vent weaning possibly later this week. Patient will be unable to go to LTAC.  Diet Order:   Diet Order    None     EDUCATION NEEDS:   No education needs have been identified at this time  Skin:  Skin Assessment: Reviewed RN Assessment (blister to buttocks)  Last BM:  12/16/2020 - large type 6  Height:   Ht Readings from Last 1 Encounters:  12/02/20 5' 0.98" (1.549 m)   Weight:   Wt Readings from Last 1 Encounters:  12/11/20 94.3 kg    Ideal Body Weight:  47.7 kg  BMI:  Body mass index is 39.3 kg/m.  Estimated Nutritional Needs:   Kcal:  [phone] (11-14 kcal/kg)  Protein:  95 grams (2 grams/kg IBW)  Fluid:  >/= 2 L/day  Jacklynn Barnacle, MS, RD, LDN Pager number available on Amion

## 2020-12-16 NOTE — Plan of Care (Signed)
  Problem: Role Relationship: Goal: Method of communication will improve Outcome: Not Progressing,  Problem: Clinical Measurements: Pt is tracheid unable to communicate needs.  Goal: Ability to maintain clinical measurements within normal limits will improve Outcome: Not Progressing  ed. Pt still on full vent support. Pt dose not follow any command, un purposeful moved legs and arm noton command

## 2020-12-16 NOTE — Progress Notes (Signed)
[place] for Electrolyte Monitoring and Replacement   Recent Labs: Potassium (mmol/L)  Date Value  12/16/2020 3.5  02/20/2013 3.4 (L)   Magnesium (mg/dL)  Date Value  12/16/2020 2.1   Calcium (mg/dL)  Date Value  12/16/2020 8.9   Calcium, Total (mg/dL)  Date Value  02/20/2013 8.7   Albumin (g/dL)  Date Value  12/16/2020 2.4 (L)  02/20/2013 3.2 (L)   Phosphorus (mg/dL)  Date Value  12/16/2020 3.8   Sodium (mmol/L)  Date Value  12/16/2020 146 (H)  02/20/2013 137   Corrected Ca: 10.2 mg/dL  Assessment: 55 year old female presented to the ED via EMS post witnessed cardiac arrest. ROSC achieved following 3 defibrillations and one round of epinephrine. Patient found to have significant hypokalemia on arrival. Given witnessed arrest, patient Code ICE. Pharmacy to manage electrolytes.   Nutrition: ProSource BID + Vital 40 mL/hr  Goal of Therapy:  Potassium 4.0 - 5.1 mmol/L Magnesium 2.1 - 2.4 mg/dL All Other Electrolytes WNL  Plan:   K 3.5, will order KCl 40 mEq per tube x 1  Follow up with morning labs  [name] [name] 12/16/2020 8:35 AM

## 2020-12-16 NOTE — Progress Notes (Signed)
CRITICAL CARE NOTE  [name] with acute cardiac arrest Labs c/w severe acidosis and severe DKA Patient with witnessed V fib arrest and shocked in the field ROSC returned is unknown  1/10 admitted to ICU for cardiac arrest, started hypothermia protocol 1/11 hypothermia protocol +posturing 1/12 signs of brain damage, needs NEURO consultation 1/13 failed SAT/SBT, unable to follow commands 1/14- for SBT today, electrolytes are improved. Encephalopathic working on mental status. 12/07/20-Patient remains crtically ill and is with interval worsening MRI brain, I met with husband to briefly review. Discussed case with Neurologist today and there is plan for meeting with family. 12/14/20- patient for LTAC placement s/p PEG/trache. No events today. 1/23 severe brain damage  /15/2022 MRI Brain 1. Progressive DWI and FLAIR abnormality within the parietotemporal cortices and the bilateral corpus striatum, consistent with anoxic brain injury. 2. No hemorrhage or mass effect.  Echo 12/03/2020 1. Left ventricular ejection fraction, by estimation, is 60 to 65%. The  left ventricle has normal function. The left ventricle has no regional  wall motion abnormalities. There is mild left ventricular hypertrophy.  Left ventricular diastolic parameters  were normal.  2. Right ventricular systolic function is normal. The right ventricular  size is normal. Tricuspid regurgitation signal is inadequate for assessing  PA pressure.  3. A small pericardial effusion is present. The pericardial effusion is  posterior and lateral to the left ventricle.  4. The mitral valve is grossly normal. No evidence of mitral valve  regurgitation. No evidence of mitral stenosis.  5. The aortic valve is tricuspid. Aortic valve regurgitation is not  visualized. No aortic stenosis is present.  6. The inferior vena cava is normal in size with greater than 50%  respiratory variability, suggesting right atrial pressure of  3 mmHg.  EEG 12/04/2020 This study issuggestive of severe diffuse encephalopathy, nonspecific etiology.No seizures or epileptiform discharges were seen throughout the recording  Micro Data:  12/02/20 MRSA Screen Negative 12/02/20 Bld Cx negative       CC  follow up respiratory failure  SUBJECTIVE Patient remains critically ill Prognosis is guarded   BP 118/76   Pulse 83   Temp 99.8 F (37.7 C) (Axillary)   Resp (!) 28   Ht 5' 0.98" (1.549 m)   Wt 94.3 kg   SpO2 100%   BMI 39.30 kg/m    I/O last 3 completed shifts: In: 3005.3 [I.V.:2044.5; Other:90; IV Piggyback:870.8] Out: 2700 [Urine:2700] No intake/output data recorded.  SpO2: 100 % FiO2 (%): 30 %  Estimated body mass index is 39.3 kg/m as calculated from the following:   Height as of this encounter: 5' 0.98" (1.549 m).   Weight as of this encounter: 94.3 kg.  SIGNIFICANT EVENTS   REVIEW OF SYSTEMS  PATIENT IS UNABLE TO PROVIDE COMPLETE REVIEW OF SYSTEMS DUE TO SEVERE CRITICAL ILLNESS        PHYSICAL EXAMINATION:  GENERAL:critically ill appearing, +resp distress NECK: Supple. S/p trach PULMONARY: +rhonchi, +wheezing CARDIOVASCULAR: S1 and S2. Regular rate and rhythm. No murmurs, rubs, or gallops.  GASTROINTESTINAL: Soft, nontender, -distended.  Positive bowel sounds.   MUSCULOSKELETAL: No swelling, clubbing, or edema.  NEUROLOGIC: obtunded, GCS<8 SKIN:intact,warm,dry  MEDICATIONS: I have reviewed all medications and confirmed regimen as documented   CULTURE RESULTS   Recent Results (from the past 240 hour(s))  Culture, blood (routine x 2)     Status: None   Collection Time: 12/10/20 11:28 AM   Specimen: BLOOD  Result Value Ref Range Status   Specimen Description  BLOOD LINE  Final   Special Requests   Final    BOTTLES DRAWN AEROBIC AND ANAEROBIC Blood Culture adequate volume   Culture   Final    NO GROWTH 5 DAYS Performed at [facility], [address]  [place], [place] [address]    Report Status 12/15/2020 FINAL  Final  Culture, blood (routine x 2)     Status: None   Collection Time: 12/10/20 11:38 AM   Specimen: BLOOD RIGHT HAND  Result Value Ref Range Status   Specimen Description BLOOD RIGHT HAND  Final   Special Requests   Final    BOTTLES DRAWN AEROBIC AND ANAEROBIC Blood Culture adequate volume   Culture   Final    NO GROWTH 5 DAYS Performed at [facility], [address], [place], [place] [address]    Report Status 12/15/2020 FINAL  Final  Culture, respiratory (non-expectorated)     Status: None   Collection Time: 12/10/20  2:12 PM   Specimen: Tracheal Aspirate; Respiratory  Result Value Ref Range Status   Specimen Description   Final    TRACHEAL ASPIRATE Performed at [facility], [address] [place], [place] [address]    Special Requests   Final    NONE Performed at [facility], [place], [place] [address]    Gram Stain   Final    RARE WBC PRESENT, PREDOMINANTLY PMN ABUNDANT GRAM POSITIVE COCCI FEW GRAM POSITIVE RODS RARE GRAM NEGATIVE RODS Performed at [facility], [place] [address] [place], [place] [address]    Culture ABUNDANT STAPHYLOCOCCUS AUREUS  Final   Report Status 12/13/2020 FINAL  Final   Organism ID, Bacteria STAPHYLOCOCCUS AUREUS  Final      Susceptibility   Staphylococcus aureus - MIC*    CIPROFLOXACIN <=0.5 SENSITIVE Sensitive     ERYTHROMYCIN <=0.25 SENSITIVE Sensitive     GENTAMICIN <=0.5 SENSITIVE Sensitive     OXACILLIN <=0.25 SENSITIVE Sensitive     TETRACYCLINE <=1 SENSITIVE Sensitive     VANCOMYCIN 1 SENSITIVE Sensitive     TRIMETH/SULFA <=10 SENSITIVE Sensitive     CLINDAMYCIN <=0.25 SENSITIVE Sensitive     RIFAMPIN <=0.5 SENSITIVE Sensitive     Inducible Clindamycin NEGATIVE Sensitive     * ABUNDANT STAPHYLOCOCCUS AUREUS  Urine Culture     Status: Abnormal   Collection Time: 12/10/20  2:30 PM   Specimen: Urine, Random  Result  Value Ref Range Status   Specimen Description   Final    URINE, RANDOM Performed at [facility], [address], [place], [place] [address]    Special Requests   Final    NONE Performed at [facility], [address] [place], [place] [address]    Culture 30,000 COLONIES/mL ENTEROCOCCUS FAECALIS (A)  Final   Report Status 12/13/2020 FINAL  Final   Organism ID, Bacteria ENTEROCOCCUS FAECALIS (A)  Final      Susceptibility   Enterococcus faecalis - MIC*    AMPICILLIN <=2 SENSITIVE Sensitive     NITROFURANTOIN <=16 SENSITIVE Sensitive     VANCOMYCIN 2 SENSITIVE Sensitive     * 30,000 COLONIES/mL ENTEROCOCCUS FAECALIS  Urine Culture     Status: Abnormal   Collection Time: 12/11/20  4:40 PM   Specimen: Urine, Random  Result Value Ref Range Status   Specimen Description   Final    URINE, RANDOM Performed at [facility], [address] [place],  [address]  Special Requests   Final    NONE Performed at [facility], [address] [place], [place] [address]    Culture >=100,000 COLONIES/mL ENTEROCOCCUS FAECALIS (A)  Final   Report Status 12/14/2020 FINAL  Final   Organism ID, Bacteria ENTEROCOCCUS FAECALIS (A)  Final      Susceptibility   Enterococcus faecalis - MIC*    AMPICILLIN <=2 SENSITIVE Sensitive     NITROFURANTOIN <=16 SENSITIVE Sensitive     VANCOMYCIN 2 SENSITIVE Sensitive     * >=100,000 COLONIES/mL ENTEROCOCCUS FAECALIS      BMP Latest Ref Rng & Units 12/16/2020 12/15/2020 12/15/2020  Glucose 70 - 99 mg/dL 140(H) - 173(H)  BUN 6 - 20 mg/dL 28(H) - 26(H)  Creatinine 0.44 - 1.00 mg/dL 0.56 - 0.63  Sodium 135 - 145 mmol/L 146(H) - 145  Potassium 3.5 - 5.1 mmol/L 3.5 2.9(L) 2.4(LL)  Chloride 98 - 111 mmol/L 106 - 101  CO2 22 - 32 mmol/L 27 - 28  Calcium 8.9 - 10.3 mg/dL 8.9 - 8.8(L)         Indwelling Urinary Catheter continued, requirement due to   Reason to continue Indwelling Urinary Catheter strict  Intake/Output monitoring for hemodynamic instability   Central Line/ continued, requirement due to  Reason to continue [place] of central venous pressure or other hemodynamic parameters and poor IV access   Ventilator continued, requirement due to severe respiratory failure   Ventilator Sedation RASS 0 to -2      ASSESSMENT AND PLAN SYNOPSIS  55 yo morbidly obese [name] with acute cardiac arrest with acute and severe hypoxic resp failure with severe brain damage s/p trach and PEG   Severe ACUTE Hypoxic and Hypercapnic Respiratory Failure -continue Full MV support    Morbid obesity, possible OSA.   Will certainly impact respiratory mechanics  ACUTE KIDNEY INJURY/Renal Failure -continue Foley Catheter-assess need -Avoid nephrotoxic agents -Follow urine output, BMP -Ensure adequate renal perfusion, optimize oxygenation -Renal dose medications     NEUROLOGY Acute toxic metabolic encephalopathy SEVERE BRAIN DAMAGE  CARDIAC ICU monitoring   GI GI PROPHYLAXIS as indicated   DIET-->TF's as tolerated Constipation protocol as indicated  ENDO - will use ICU hypoglycemic\Hyperglycemia protocol if indicated     ELECTROLYTES -follow labs as needed -replace as needed -pharmacy consultation and following   DVT/GI PRX ordered and assessed TRANSFUSIONS AS NEEDED MONITOR FSBS I Assessed the need for Labs I Assessed the need for Foley I Assessed the need for Central Venous Line Family Discussion when available I Assessed the need for Mobilization I made an Assessment of medications to be adjusted accordingly Safety Risk assessment completed   CASE DISCUSSED IN MULTIDISCIPLINARY ROUNDS WITH ICU TEAM  Critical Care Time devoted to patient care services described in this note is 45 minutes.   Overall, patient is critically ill, prognosis is guarded.     Corrin Parker, M.D.  [facility]  Medical Director  [place] Director [facility] Cardio-Pulmonary Department

## 2020-12-17 LAB — GLUCOSE, CAPILLARY
Glucose-Capillary: 141 mg/dL — ABNORMAL HIGH (ref 70–99)
Glucose-Capillary: 134 mg/dL — ABNORMAL HIGH (ref 70–99)
Glucose-Capillary: 143 mg/dL — ABNORMAL HIGH (ref 70–99)
Glucose-Capillary: 166 mg/dL — ABNORMAL HIGH (ref 70–99)
Glucose-Capillary: 158 mg/dL — ABNORMAL HIGH (ref 70–99)

## 2020-12-17 LAB — RENAL FUNCTION PANEL
Albumin: 2.1 g/dL — ABNORMAL LOW (ref 3.5–5.0)
Anion gap: 9 (ref 5–15)
BUN: 33 mg/dL — ABNORMAL HIGH (ref 6–20)
CO2: 30 mmol/L (ref 22–32)
Calcium: 8.4 mg/dL — ABNORMAL LOW (ref 8.9–10.3)
Chloride: 107 mmol/L (ref 98–111)
Creatinine, Ser: 0.68 mg/dL (ref 0.44–1.00)
GFR, Estimated: >60 mL/min (ref >60)
Glucose, Bld: 161 mg/dL — ABNORMAL HIGH (ref 70–99)
Phosphorus: 4 mg/dL (ref 2.5–4.6)
Potassium: 3.3 mmol/L — ABNORMAL LOW (ref 3.5–5.1)
Sodium: 146 mmol/L — ABNORMAL HIGH (ref 135–145)

## 2020-12-17 LAB — HEMOGLOBIN AND HEMATOCRIT, BLOOD
HCT: 24 % — ABNORMAL LOW (ref 36.0–46.0)
Hemoglobin: 7.1 g/dL — ABNORMAL LOW (ref 12.0–15.0)

## 2020-12-17 LAB — CBC WITH DIFFERENTIAL/PLATELET
Abs Immature Granulocytes: 0.04 10*3/uL (ref 0.00–0.07)
Basophils Absolute: 0 10*3/uL (ref 0.0–0.1)
Basophils Relative: 0 %
Eosinophils Absolute: 0.2 10*3/uL (ref 0.0–0.5)
Eosinophils Relative: 2 %
HCT: 23.3 % — ABNORMAL LOW (ref 36.0–46.0)
Hemoglobin: 6.7 g/dL — ABNORMAL LOW (ref 12.0–15.0)
Immature Granulocytes: 0 %
Lymphocytes Relative: 13 %
Lymphs Abs: 1.2 10*3/uL (ref 0.7–4.0)
MCH: 24.5 pg — ABNORMAL LOW (ref 26.0–34.0)
MCHC: 28.8 g/dL — ABNORMAL LOW (ref 30.0–36.0)
MCV: 85.3 fL (ref 80.0–100.0)
Monocytes Absolute: 0.6 10*3/uL (ref 0.1–1.0)
Monocytes Relative: 7 %
Neutro Abs: 7.3 10*3/uL (ref 1.7–7.7)
Neutrophils Relative %: 78 %
Platelets: 198 10*3/uL (ref 150–400)
RBC: 2.73 MIL/uL — ABNORMAL LOW (ref 3.87–5.11)
RDW: 19.9 % — ABNORMAL HIGH (ref 11.5–15.5)
WBC: 9.3 10*3/uL (ref 4.0–10.5)
nRBC: 0.2 % (ref 0.0–0.2)

## 2020-12-17 LAB — MAGNESIUM: Magnesium: 2 mg/dL (ref 1.7–2.4)

## 2020-12-17 MED ORDER — POTASSIUM CHLORIDE 10 MEQ/100ML IV SOLN
10.0000 meq | INTRAVENOUS | Status: DC
  Filled 2020-12-17 (×6): qty 100

## 2020-12-17 MED ORDER — MORPHINE SULFATE (PF) 2 MG/ML IV SOLN
2.0000 mg | INTRAVENOUS | Status: DC | PRN
  Administered 2020-12-17 – 2020-12-21 (×4): 2 mg via INTRAVENOUS
  Filled 2020-12-17 (×4): qty 1

## 2020-12-17 MED ORDER — POTASSIUM CHLORIDE 20 MEQ PO PACK
40.0000 meq | PACK | Freq: Once | ORAL | Status: AC
  Administered 2020-12-17: 40 meq
  Filled 2020-12-17: qty 2

## 2020-12-17 MED ORDER — POTASSIUM CHLORIDE 20 MEQ PO PACK
40.0000 meq | PACK | Freq: Once | ORAL | Status: AC
  Administered 2020-12-17: 40 meq via ORAL
  Filled 2020-12-17: qty 2

## 2020-12-17 MED ORDER — FUROSEMIDE 10 MG/ML IJ SOLN
40.0000 mg | Freq: Once | INTRAMUSCULAR | Status: AC
  Administered 2020-12-17: 40 mg via INTRAVENOUS
  Filled 2020-12-17: qty 4

## 2020-12-17 NOTE — Progress Notes (Signed)
CRITICAL CARE NOTE  [name] with acute cardiac arrest Labs c/w severe acidosis and severe DKA Patient with witnessed V fib arrest and shocked in the field ROSC returned is unknown  1/10 admitted to ICU for cardiac arrest, started hypothermia protocol 1/11 hypothermia protocol +posturing 1/12 signs of brain damage, needs NEURO consultation 1/13 failed SAT/SBT, unable to follow commands 1/14- for SBT today, electrolytes are improved. Encephalopathic working on mental status. 12/07/20-Patient remains crtically ill and is with interval worsening MRI brain, I met with husband to briefly review. Discussed case with Neurologist today and there is plan for meeting with family. 12/14/20- patient for LTAC placement s/p PEG/trache. No events today. 1/23severe brain damage 1/24 severe Brain Damage, severe agitation   /15/2022 MRI Brain 1. Progressive DWI and FLAIR abnormality within the parietotemporal cortices and the bilateral corpus striatum, consistent with anoxic brain injury. 2. No hemorrhage or mass effect.  Echo 12/03/2020 1. Left ventricular ejection fraction, by estimation, is 60 to 65%. The  left ventricle has normal function. The left ventricle has no regional  wall motion abnormalities. There is mild left ventricular hypertrophy.  Left ventricular diastolic parameters  were normal.  2. Right ventricular systolic function is normal. The right ventricular  size is normal. Tricuspid regurgitation signal is inadequate for assessing  PA pressure.  3. A small pericardial effusion is present. The pericardial effusion is  posterior and lateral to the left ventricle.  4. The mitral valve is grossly normal. No evidence of mitral valve  regurgitation. No evidence of mitral stenosis.  5. The aortic valve is tricuspid. Aortic valve regurgitation is not  visualized. No aortic stenosis is present.  6. The inferior vena cava is normal in size with greater than 50%  respiratory  variability, suggesting right atrial pressure of 3 mmHg.  EEG 12/04/2020 This study issuggestive of severe diffuse encephalopathy, nonspecific etiology.No seizures or epileptiform discharges were seen throughout the recording  Micro Data:  12/02/20 MRSA Screen Negative 12/02/20 Bld Cx negative      CC  follow up respiratory failure  SUBJECTIVE Patient remains critically ill Prognosis is guarded S/p trach and PEG tube   BP (!) 108/52   Pulse 71   Temp (!) 101.4 F (38.6 C) (Oral)   Resp 16   Ht 5' 0.98" (1.549 m)   Wt 90.9 kg   SpO2 100%   BMI 37.88 kg/m    I/O last 3 completed shifts: In: 7578.9 [I.V.:2784.1; Other:320; NG/GT:3220.7; IV Piggyback:1254.2] Out: 3200 [Urine:3200] No intake/output data recorded.  SpO2: 100 % FiO2 (%): 30 %  Estimated body mass index is 37.88 kg/m as calculated from the following:   Height as of this encounter: 5' 0.98" (1.549 m).   Weight as of this encounter: 90.9 kg.  SIGNIFICANT EVENTS   REVIEW OF SYSTEMS  PATIENT IS UNABLE TO PROVIDE COMPLETE REVIEW OF SYSTEMS DUE TO SEVERE CRITICAL ILLNESS        PHYSICAL EXAMINATION:  GENERAL:critically ill appearing, +resp distress NECK: Supple. S/p trach PULMONARY: +rhonchi, +wheezing CARDIOVASCULAR: S1 and S2. Regular rate and rhythm. No murmurs, rubs, or gallops.  GASTROINTESTINAL: Soft, nontender, -distended.  Positive bowel sounds.   MUSCULOSKELETAL: No swelling, clubbing, or edema.  NEUROLOGIC: obtunded, GCS<8 SKIN:intact,warm,dry  MEDICATIONS: I have reviewed all medications and confirmed regimen as documented   CULTURE RESULTS   Recent Results (from the past 240 hour(s))  Culture, blood (routine x 2)     Status: None   Collection Time: 12/10/20 11:28 AM  Specimen: BLOOD  Result Value Ref Range Status   Specimen Description BLOOD LINE  Final   Special Requests   Final    BOTTLES DRAWN AEROBIC AND ANAEROBIC Blood Culture adequate volume   Culture   Final     NO GROWTH 5 DAYS Performed at [facility], [address] [place], [place] [address]    Report Status 12/15/2020 FINAL  Final  Culture, blood (routine x 2)     Status: None   Collection Time: 12/10/20 11:38 AM   Specimen: BLOOD RIGHT HAND  Result Value Ref Range Status   Specimen Description BLOOD RIGHT HAND  Final   Special Requests   Final    BOTTLES DRAWN AEROBIC AND ANAEROBIC Blood Culture adequate volume   Culture   Final    NO GROWTH 5 DAYS Performed at [facility], [address] [place], [place] [address]    Report Status 12/15/2020 FINAL  Final  Culture, respiratory (non-expectorated)     Status: None   Collection Time: 12/10/20  2:12 PM   Specimen: Tracheal Aspirate; Respiratory  Result Value Ref Range Status   Specimen Description   Final    TRACHEAL ASPIRATE Performed at [facility] [address] [place], [place] [address]    Special Requests   Final    NONE Performed at [facility], [address] [place], [place] [address]    Gram Stain   Final    RARE WBC PRESENT, PREDOMINANTLY PMN ABUNDANT GRAM POSITIVE COCCI FEW GRAM POSITIVE RODS RARE GRAM NEGATIVE RODS Performed at [facility], [address] [place], [place] [address]    Culture ABUNDANT STAPHYLOCOCCUS AUREUS  Final   Report Status 12/13/2020 FINAL  Final   Organism ID, Bacteria STAPHYLOCOCCUS AUREUS  Final      Susceptibility   Staphylococcus aureus - MIC*    CIPROFLOXACIN <=0.5 SENSITIVE Sensitive     ERYTHROMYCIN <=0.25 SENSITIVE Sensitive     GENTAMICIN <=0.5 SENSITIVE Sensitive     OXACILLIN <=0.25 SENSITIVE Sensitive     TETRACYCLINE <=1 SENSITIVE Sensitive     VANCOMYCIN 1 SENSITIVE Sensitive     TRIMETH/SULFA <=10 SENSITIVE Sensitive     CLINDAMYCIN <=0.25 SENSITIVE Sensitive     RIFAMPIN <=0.5 SENSITIVE Sensitive     Inducible Clindamycin NEGATIVE Sensitive     * ABUNDANT STAPHYLOCOCCUS AUREUS  Urine Culture     Status: Abnormal    Collection Time: 12/10/20  2:30 PM   Specimen: Urine, Random  Result Value Ref Range Status   Specimen Description   Final    URINE, RANDOM Performed at [facility] [address] [place], [place] [address]    Special Requests   Final    NONE Performed at [facility], [address] [place], [place] [address]    Culture 30,000 COLONIES/mL ENTEROCOCCUS FAECALIS (A)  Final   Report Status 12/13/2020 FINAL  Final   Organism ID, Bacteria ENTEROCOCCUS FAECALIS (A)  Final      Susceptibility   Enterococcus faecalis - MIC*    AMPICILLIN <=2 SENSITIVE Sensitive     NITROFURANTOIN <=16 SENSITIVE Sensitive     VANCOMYCIN 2 SENSITIVE Sensitive     * 30,000 COLONIES/mL ENTEROCOCCUS FAECALIS  Urine Culture     Status: Abnormal   Collection Time: 12/11/20  4:40 PM   Specimen: Urine, Random  Result Value Ref Range Status   Specimen Description   Final    URINE, RANDOM Performed at [facility], [address], [place], [place] [address]    Special Requests   Final    NONE Performed at [facility], [address] [place], [place] [address]    Culture >=100,000 COLONIES/mL ENTEROCOCCUS FAECALIS (A)  Final   Report Status 12/14/2020 FINAL  Final   Organism ID, Bacteria ENTEROCOCCUS FAECALIS (A)  Final      Susceptibility   Enterococcus faecalis - MIC*    AMPICILLIN <=2 SENSITIVE Sensitive     NITROFURANTOIN <=16 SENSITIVE Sensitive     VANCOMYCIN 2 SENSITIVE Sensitive     * >=100,000 COLONIES/mL ENTEROCOCCUS FAECALIS          Nutrition Status: Nutrition Problem: Inadequate oral intake Etiology: inability to eat Signs/Symptoms: NPO status Interventions: Tube feeding,Prostat,MVI     Indwelling Urinary Catheter continued, requirement due to   Reason to continue Indwelling Urinary Catheter strict Intake/Output monitoring for hemodynamic instability   Central Line/ continued, requirement due to  Reason to continue [place] of central venous pressure or other hemodynamic parameters and poor IV access   Ventilator continued, requirement due to severe respiratory failure   Ventilator Sedation RASS 0 to -2      ASSESSMENT AND PLAN SYNOPSIS  55 yo morbidly obese [name] with acute cardiac arrest with acute and severe hypoxic resp failure with severe brain damage s/p trach and PEG  Severe ACUTE Hypoxic and Hypercapnic Respiratory Failure -continue Full MV support -VAP/VENT bundle implementation Wean to PS mode is tolerated  Morbid obesity, possible OSA.   Will certainly impact respiratory mechanics   ACUTE KIDNEY INJURY/Renal Failure -continue Foley Catheter-assess need -Avoid nephrotoxic agents -Follow urine output, BMP -Ensure adequate renal perfusion, optimize oxygenation -Renal dose medications     NEUROLOGY Acute toxic metabolic encephalopathy SEVERE BRAIN DAMAGE   CARDIAC ICU monitoring   GI GI PROPHYLAXIS as indicated  DIET-->TF's as tolerated Constipation protocol as indicated  ENDO - will use ICU hypoglycemic\Hyperglycemia protocol if indicated     ELECTROLYTES -follow labs as needed -replace as needed -pharmacy consultation and following   DVT/GI PRX ordered and assessed TRANSFUSIONS AS NEEDED MONITOR FSBS I Assessed the need for Labs I Assessed the need for Foley I Assessed the need for Central Venous Line Family Discussion when available I Assessed the need for Mobilization I made an Assessment of medications to be adjusted accordingly Safety Risk assessment completed   CASE DISCUSSED IN MULTIDISCIPLINARY ROUNDS WITH ICU TEAM  Critical Care Time devoted to patient care services described in this note is 55 minutes.   Overall, patient is critically ill, prognosis is guarded.     Corrin Parker, M.D.  [facility]  Medical Director [place] Director [facility] Cardio-Pulmonary Department

## 2020-12-17 NOTE — Progress Notes (Signed)
Husband remains bedside, he is as usual reading the bible to her and praying over her in a gentle, loving manner.

## 2020-12-17 NOTE — Plan of Care (Signed)
Patient remains on PRVC. Did wean on PSV for 2 hours this AM. Continuous to have diarrhea. Likely related to TFs. Developing fever; CTM at this time; temp not high enough for parameters to treat.     Problem: Activity: Goal: Ability to tolerate increased activity will improve Outcome: Not Progressing   Problem: Respiratory: Goal: Ability to maintain a clear airway and adequate ventilation will improve Outcome: Progressing   Problem: Role Relationship: Goal: Method of communication will improve Outcome: Not Progressing   Problem: Health Behavior/Discharge Planning: Goal: Ability to manage health-related needs will improve Outcome: Not Progressing   Problem: Clinical Measurements: Goal: Ability to maintain clinical measurements within normal limits will improve Outcome: Not Progressing Goal: Will remain free from infection Outcome: Not Progressing Goal: Diagnostic test results will improve Outcome: Not Progressing Goal: Respiratory complications will improve Outcome: Not Progressing Goal: Cardiovascular complication will be avoided Outcome: Not Progressing   Problem: Nutrition: Goal: Adequate nutrition will be maintained Outcome: Progressing

## 2020-12-17 NOTE — Progress Notes (Signed)
[place] for Electrolyte Monitoring and Replacement   Recent Labs: Potassium (mmol/L)  Date Value  12/17/2020 3.3 (L)  02/20/2013 3.4 (L)   Magnesium (mg/dL)  Date Value  12/17/2020 2.0   Calcium (mg/dL)  Date Value  12/17/2020 8.4 (L)   Calcium, Total (mg/dL)  Date Value  02/20/2013 8.7   Albumin (g/dL)  Date Value  12/17/2020 2.1 (L)  02/20/2013 3.2 (L)   Phosphorus (mg/dL)  Date Value  12/17/2020 4.0   Sodium (mmol/L)  Date Value  12/17/2020 146 (H)  02/20/2013 137   Corrected Ca: 10.2 mg/dL  Assessment: 55 year old female presented to the ED via EMS post witnessed cardiac arrest. ROSC achieved following 3 defibrillations and one round of epinephrine. Patient found to have significant hypokalemia on arrival. Given witnessed arrest, patient Code ICE. Pharmacy to manage electrolytes.   Nutrition: ProSource BID + Vital 40 mL/hr  Goal of Therapy:  Potassium 4.0 - 5.1 mmol/L Magnesium 2.1 - 2.4 mg/dL All Other Electrolytes WNL  Plan:   K 3.3, KCl 40 mEq per tube x 1  Follow up with morning labs  Benita Gutter 12/17/2020 8:46 AM

## 2020-12-18 LAB — CBC WITH DIFFERENTIAL/PLATELET
Abs Immature Granulocytes: 0.04 10*3/uL (ref 0.00–0.07)
Basophils Absolute: 0 10*3/uL (ref 0.0–0.1)
Basophils Relative: 0 %
Eosinophils Absolute: 0.3 10*3/uL (ref 0.0–0.5)
Eosinophils Relative: 3 %
HCT: 24.3 % — ABNORMAL LOW (ref 36.0–46.0)
Hemoglobin: 7.1 g/dL — ABNORMAL LOW (ref 12.0–15.0)
Immature Granulocytes: 0 %
Lymphocytes Relative: 13 %
Lymphs Abs: 1.3 10*3/uL (ref 0.7–4.0)
MCH: 24.7 pg — ABNORMAL LOW (ref 26.0–34.0)
MCHC: 29.2 g/dL — ABNORMAL LOW (ref 30.0–36.0)
MCV: 84.4 fL (ref 80.0–100.0)
Monocytes Absolute: 0.6 10*3/uL (ref 0.1–1.0)
Monocytes Relative: 7 %
Neutro Abs: 7.3 10*3/uL (ref 1.7–7.7)
Neutrophils Relative %: 77 %
Platelets: 246 10*3/uL (ref 150–400)
RBC: 2.88 MIL/uL — ABNORMAL LOW (ref 3.87–5.11)
RDW: 20 % — ABNORMAL HIGH (ref 11.5–15.5)
WBC: 9.6 10*3/uL (ref 4.0–10.5)
nRBC: 0 % (ref 0.0–0.2)

## 2020-12-18 LAB — RENAL FUNCTION PANEL
Albumin: 2.2 g/dL — ABNORMAL LOW (ref 3.5–5.0)
Anion gap: 13 (ref 5–15)
BUN: 23 mg/dL — ABNORMAL HIGH (ref 6–20)
CO2: 29 mmol/L (ref 22–32)
Calcium: 8.1 mg/dL — ABNORMAL LOW (ref 8.9–10.3)
Chloride: 107 mmol/L (ref 98–111)
Creatinine, Ser: 0.58 mg/dL (ref 0.44–1.00)
GFR, Estimated: >60 mL/min (ref >60)
Glucose, Bld: 167 mg/dL — ABNORMAL HIGH (ref 70–99)
Phosphorus: 3.5 mg/dL (ref 2.5–4.6)
Potassium: 3.8 mmol/L (ref 3.5–5.1)
Sodium: 149 mmol/L — ABNORMAL HIGH (ref 135–145)

## 2020-12-18 LAB — GLUCOSE, CAPILLARY
Glucose-Capillary: 149 mg/dL — ABNORMAL HIGH (ref 70–99)
Glucose-Capillary: 151 mg/dL — ABNORMAL HIGH (ref 70–99)
Glucose-Capillary: 152 mg/dL — ABNORMAL HIGH (ref 70–99)
Glucose-Capillary: 153 mg/dL — ABNORMAL HIGH (ref 70–99)
Glucose-Capillary: 153 mg/dL — ABNORMAL HIGH (ref 70–99)
Glucose-Capillary: 163 mg/dL — ABNORMAL HIGH (ref 70–99)
Glucose-Capillary: 172 mg/dL — ABNORMAL HIGH (ref 70–99)

## 2020-12-18 LAB — MAGNESIUM: Magnesium: 1.6 mg/dL — ABNORMAL LOW (ref 1.7–2.4)

## 2020-12-18 MED ORDER — HEPARIN SODIUM (PORCINE) 5000 UNIT/ML IJ SOLN
5000.0000 [IU] | Freq: Three times a day (TID) | INTRAMUSCULAR | Status: DC
  Administered 2020-12-18 – 2020-12-31 (×39): 5000 [IU] via SUBCUTANEOUS
  Filled 2020-12-18 (×39): qty 1

## 2020-12-18 MED ORDER — MAGNESIUM SULFATE 2 GM/50ML IV SOLN
2.0000 g | Freq: Once | INTRAVENOUS | Status: AC
  Administered 2020-12-18: 2 g via INTRAVENOUS
  Filled 2020-12-18: qty 50

## 2020-12-18 MED ORDER — FREE WATER
200.0000 mL | Status: DC
  Administered 2020-12-18 – 2020-12-26 (×48): 200 mL

## 2020-12-18 NOTE — Progress Notes (Signed)
CRITICAL CARE NOTE [name] with acute cardiac arrest Labs c/w severe acidosis and severe DKA Patient with witnessed V fib arrest and shocked in the field ROSC returned is unknown    12/07/2020 MRI Brain 1. Progressive DWI and FLAIR abnormality within the parietotemporal cortices and the bilateral corpus striatum, consistent with anoxic brain injury. 2. No hemorrhage or mass effect.    1/10 admitted to ICU for cardiac arrest, started hypothermia protocol 1/11 hypothermia protocol +posturing 1/12 signs of brain damage, needs NEURO consultation 1/13 failed SAT/SBT, unable to follow commands 1/14- for SBT today, electrolytes are improved. Encephalopathic working on mental status. 12/07/20-Patient remains crtically ill and is with interval worsening MRI brain, I met with husband to briefly review. Discussed case with Neurologist today and there is plan for meeting with family. 12/14/20- patient for LTAC placement s/p PEG/trache. No events today. 1/23severe brain damage 1/24 severe Brain Damage, severe agitation 1/25 severe brain damage   CC  follow up respiratory failure  SUBJECTIVE Patient remains critically ill Prognosis is guarded   BP 120/69   Pulse 91   Temp 99 F (37.2 C) (Axillary)   Resp 19   Ht 5' 0.98" (1.549 m)   Wt 90.9 kg   SpO2 100%   BMI 37.88 kg/m    I/O last 3 completed shifts: In: 2644 [I.V.:548.9; Other:395; NG/GT:1200; IV Piggyback:500.1] Out: 2550 [Urine:2550] No intake/output data recorded.  SpO2: 100 % FiO2 (%): 30 %  Estimated body mass index is 37.88 kg/m as calculated from the following:   Height as of this encounter: 5' 0.98" (1.549 m).   Weight as of this encounter: 90.9 kg.  SIGNIFICANT EVENTS   REVIEW OF SYSTEMS  PATIENT IS UNABLE TO PROVIDE COMPLETE REVIEW OF SYSTEMS DUE TO SEVERE CRITICAL ILLNESS        PHYSICAL EXAMINATION:  GENERAL:critically ill appearing, +resp distress NECK: Supple.  S/p trach PULMONARY:  +rhonchi, +wheezing CARDIOVASCULAR: S1 and S2. Regular rate and rhythm. No murmurs, rubs, or gallops.  GASTROINTESTINAL: Soft, nontender, -distended.  Positive bowel sounds.  S/p peg  MUSCULOSKELETAL: No swelling, clubbing, or edema.  NEUROLOGIC: obtunded, GCS<8 SKIN:intact,warm,dry  MEDICATIONS: I have reviewed all medications and confirmed regimen as documented   CULTURE RESULTS   Recent Results (from the past 240 hour(s))  Culture, blood (routine x 2)     Status: None   Collection Time: 12/10/20 11:28 AM   Specimen: BLOOD  Result Value Ref Range Status   Specimen Description BLOOD LINE  Final   Special Requests   Final    BOTTLES DRAWN AEROBIC AND ANAEROBIC Blood Culture adequate volume   Culture   Final    NO GROWTH 5 DAYS Performed at [facility], [address], [place], [place] [address]    Report Status 12/15/2020 FINAL  Final  Culture, blood (routine x 2)     Status: None   Collection Time: 12/10/20 11:38 AM   Specimen: BLOOD RIGHT HAND  Result Value Ref Range Status   Specimen Description BLOOD RIGHT HAND  Final   Special Requests   Final    BOTTLES DRAWN AEROBIC AND ANAEROBIC Blood Culture adequate volume   Culture   Final    NO GROWTH 5 DAYS Performed at [facility], [address] [place], [place] [address]    Report Status 12/15/2020 FINAL  Final  Culture, respiratory (non-expectorated)     Status: None   Collection Time: 12/10/20  2:12 PM   Specimen: Tracheal Aspirate; Respiratory  Result Value Ref Range Status   Specimen Description   Final    TRACHEAL ASPIRATE Performed at [facility], [address] [place], [place] [address]    Special Requests   Final    NONE Performed at [facility], [place], [place] [address]    Gram Stain   Final    RARE WBC PRESENT, PREDOMINANTLY PMN ABUNDANT GRAM POSITIVE COCCI FEW GRAM POSITIVE RODS RARE GRAM NEGATIVE RODS Performed at [place], [place] [address], [place], [place] [address]    Culture ABUNDANT STAPHYLOCOCCUS AUREUS  Final   Report Status 12/13/2020 FINAL  Final   Organism ID, Bacteria STAPHYLOCOCCUS AUREUS  Final      Susceptibility   Staphylococcus aureus - MIC*    CIPROFLOXACIN <=0.5 SENSITIVE Sensitive     ERYTHROMYCIN <=0.25 SENSITIVE Sensitive     GENTAMICIN <=0.5 SENSITIVE Sensitive     OXACILLIN <=0.25 SENSITIVE Sensitive     TETRACYCLINE <=1 SENSITIVE Sensitive     VANCOMYCIN 1 SENSITIVE Sensitive     TRIMETH/SULFA <=10 SENSITIVE Sensitive     CLINDAMYCIN <=0.25 SENSITIVE Sensitive     RIFAMPIN <=0.5 SENSITIVE Sensitive     Inducible Clindamycin NEGATIVE Sensitive     * ABUNDANT STAPHYLOCOCCUS AUREUS  Urine Culture     Status: Abnormal   Collection Time: 12/10/20  2:30 PM   Specimen: Urine, Random  Result Value Ref Range Status   Specimen Description   Final    URINE, RANDOM Performed at [facility], [address] [place], [place] [address]    Special Requests   Final    NONE Performed at [facility], [address] [place], [place] [address]    Culture 30,000 COLONIES/mL ENTEROCOCCUS FAECALIS (A)  Final   Report Status 12/13/2020 FINAL  Final   Organism ID, Bacteria ENTEROCOCCUS FAECALIS (A)  Final      Susceptibility   Enterococcus faecalis - MIC*    AMPICILLIN <=2 SENSITIVE Sensitive     NITROFURANTOIN <=16 SENSITIVE Sensitive     VANCOMYCIN 2 SENSITIVE Sensitive     * 30,000 COLONIES/mL ENTEROCOCCUS FAECALIS  Urine Culture     Status: Abnormal   Collection Time: 12/11/20  4:40 PM   Specimen: Urine, Random  Result Value Ref Range Status   Specimen Description   Final    URINE, RANDOM Performed at [facility], [address], [place], [place] [address]    Special Requests   Final    NONE Performed at [facility], [address], [place],  [address]    Culture >=100,000 COLONIES/mL ENTEROCOCCUS FAECALIS (A)  Final   Report Status  12/14/2020 FINAL  Final   Organism ID, Bacteria ENTEROCOCCUS FAECALIS (A)  Final      Susceptibility   Enterococcus faecalis - MIC*    AMPICILLIN <=2 SENSITIVE Sensitive     NITROFURANTOIN <=16 SENSITIVE Sensitive     VANCOMYCIN 2 SENSITIVE Sensitive     * >=100,000 COLONIES/mL ENTEROCOCCUS FAECALIS          IMAGING    No results found.   Nutrition Status: Nutrition Problem: Inadequate oral intake Etiology: inability to eat Signs/Symptoms: NPO status Interventions: Tube feeding,Prostat,MVI     Indwelling Urinary Catheter continued, requirement due to   Reason to continue Indwelling Urinary Catheter strict Intake/Output monitoring for hemodynamic instability         Ventilator continued, requirement due to severe respiratory failure   Ventilator Sedation RASS  0 to -2      ASSESSMENT AND PLAN SYNOPSIS  55 yo morbidly obese [name] with acute cardiac arrest with acute and severe hypoxic resp failure with severe brain damage s/p trach and PEG  Severe ACUTE Hypoxic and Hypercapnic Respiratory Failure -continue Full MV support wean to PS as tolerated -continue Bronchodilator Therapy -Wean Fio2 and PEEP as tolerated -will perform SAT/SBT when respiratory parameters are met -VAP/VENT bundle implementation  ACUTE DIASTOLIC CARDIAC FAILURE-  -oxygen as needed -Lasix as tolerated -follow up cardiac enzymes as indicated -follow up cardiology recs   Morbid obesity, possible OSA.   Will certainly impact respiratory mechanics    NEUROLOGY Acute toxic metabolic encephalopathy,  Anoxic brain injury   CARDIAC ICU monitoring  GI GI PROPHYLAXIS as indicated   DIET-->TF's as tolerated Constipation protocol as indicated  ENDO - will use ICU hypoglycemic\Hyperglycemia protocol if indicated     ELECTROLYTES -follow labs as needed -replace as needed -pharmacy consultation and following   DVT/GI PRX ordered and assessed TRANSFUSIONS AS NEEDED MONITOR  FSBS I Assessed the need for Labs I Assessed the need for Foley I Assessed the need for Central Venous Line Family Discussion when available I Assessed the need for Mobilization I made an Assessment of medications to be adjusted accordingly Safety Risk assessment completed   CASE DISCUSSED IN MULTIDISCIPLINARY ROUNDS WITH ICU TEAM  Critical Care Time devoted to patient care services described in this note is 45  minutes.   Overall, patient is critically ill, prognosis is guarded.     Corrin Parker, M.D.  [facility]  Medical Director [place] Director [facility] Cardio-Pulmonary Department

## 2020-12-18 NOTE — Progress Notes (Signed)
[name] for Electrolyte Monitoring and Replacement   Recent Labs: Potassium (mmol/L)  Date Value  12/18/2020 3.8  02/20/2013 3.4 (L)   Magnesium (mg/dL)  Date Value  12/18/2020 1.6 (L)   Calcium (mg/dL)  Date Value  12/18/2020 8.1 (L)   Calcium, Total (mg/dL)  Date Value  02/20/2013 8.7   Albumin (g/dL)  Date Value  12/18/2020 2.2 (L)  02/20/2013 3.2 (L)   Phosphorus (mg/dL)  Date Value  12/18/2020 3.5   Sodium (mmol/L)  Date Value  12/18/2020 149 (H)  02/20/2013 137   Corrected Ca: 9.5 mg/dL  Assessment: 55 year old female presented to the ED via EMS post witnessed cardiac arrest. ROSC achieved following 3 defibrillations and one round of epinephrine. Patient found to have significant hypokalemia on arrival. Given witnessed arrest, patient Code ICE. Pharmacy to manage electrolytes.   Nutrition: ProSource BID + Vital 40 mL/hr  Goal of Therapy:  Potassium 4.0 - 5.1 mmol/L Magnesium 2.1 - 2.4 mg/dL All Other Electrolytes WNL  Plan:   Na 149, likely secondary to IV Lasix 1/25  Mg 1.6, IV magnesium sulfate 2 g x 1  Follow up with morning labs  Benita Gutter 12/18/2020 8:29 AM

## 2020-12-18 NOTE — TOC Progression Note (Signed)
Transition of Care [facility]) - Progression Note    Patient Details  Name: [name] [name] MRN: [id number] Date of Birth: [birth year]  Transition of Care [facility]) CM/SW [place], [place] Phone Number: [phone] 12/18/2020, 3:52 PM  Clinical Narrative:     CSW spoke briefly with patient's Chrysta, Fulcher (Spouse) [phone] who stated the patient has insurance.  CSW asked if he know which insurance company it was with but Mr. Glasco stated he did not know.  Mr. Dowler stated he would take pictures of the card and send them to [place].  Mr. Folds stated when he found the insurance cards he would call CSW.     Expected Discharge Plan: Long Term Nursing Home Barriers to Discharge: Inadequate or no insurance,Continued Medical Work up,Financial Resources  Expected Discharge Plan and Services Expected Discharge Plan: Long Term Nursing Home In-house Referral: Clinical Social Work   Post Acute Care Choice: Nursing Home Living arrangements for the past 2 months: Single Family Home                                       Social Determinants of Health (SDOH) Interventions    Readmission Risk Interventions No flowsheet data found.

## 2020-12-18 NOTE — TOC Initial Note (Signed)
Transition of Care [facility]) - Initial/Assessment Note    Patient Details  Name: [name] [name] MRN: [id number] Date of Birth: [birth year]  Transition of Care [facility]) CM/SW Contact:    Ova Freshwater Phone Number: [phone] 12/18/2020, 11:49 AM  Clinical Narrative:                  Patient was admitted due to cardiac arrest.  Patient exhibits signs of brain damage and is unable to follow commands but attempts to reply when spoken to. CSW called and left voicemail for patient's [name],[name] (Spouse) [phone] to discuss transition of care plans.  Expected Discharge Plan: Long Term Nursing Home Barriers to Discharge: Inadequate or no insurance,Continued Medical Work up,Financial Resources   Patient Goals and CMS Choice        Expected Discharge Plan and Services Expected Discharge Plan: Long Term Nursing Home In-house Referral: Clinical Social Work   Post Acute Care Choice: Nursing Home Living arrangements for the past 2 months: [place]                                      Prior Living Arrangements/Services Living arrangements for the past 2 months: Single Family Home Lives with:: Spouse          Need for Family Participation in Patient Care: Yes (Comment) Care giver support system in place?: Yes (comment)      Activities of Daily Living Home Assistive Devices/Equipment: None ADL Screening (condition at time of admission) Patient's cognitive ability adequate to safely complete daily activities?: Yes Is the patient deaf or have difficulty hearing?: No Does the patient have difficulty seeing, even when wearing glasses/contacts?: No Does the patient have difficulty concentrating, remembering, or making decisions?: No Patient able to express need for assistance with ADLs?: Yes Does the patient have difficulty dressing or bathing?: No Independently performs ADLs?: Yes (appropriate for developmental age) Does the patient have difficulty walking  or climbing stairs?: No Weakness of Legs: None Weakness of Arms/Hands: None  Permission Sought/Granted Permission sought to share information with : Facility Sport and exercise psychologist    Share Information with NAME: [name],[name] (Spouse)   [phone]           Emotional Assessment Appearance:: Appears stated age Attitude/Demeanor/Rapport: Unable to Assess Affect (typically observed): Unable to Assess        Admission diagnosis:  Cardiac arrest [facility]) [I46.9] Patient Active Problem List   Diagnosis Date Noted  . Acute respiratory failure ([place])   . Hypoxic ischemic encephalopathy   . Endotracheal tube present   . Cardiac arrest ([place]) 12/02/2020   PCP:  Marinda Elk, MD Pharmacy:   [facility] [address] ([address]), [place] [address] [place]) [place] [address] Phone: [phone] Fax: [phone]     Social Determinants of Health (SDOH) Interventions    Readmission Risk Interventions No flowsheet data found.

## 2020-12-19 DIAGNOSIS — E669 Obesity, unspecified: Secondary | ICD-10-CM

## 2020-12-19 DIAGNOSIS — Z930 Tracheostomy status: Secondary | ICD-10-CM

## 2020-12-19 DIAGNOSIS — U071 COVID-19: Secondary | ICD-10-CM

## 2020-12-19 DIAGNOSIS — E870 Hyperosmolality and hypernatremia: Secondary | ICD-10-CM

## 2020-12-19 LAB — FERRITIN: Ferritin: 37 ng/mL (ref 11–307)

## 2020-12-19 LAB — RENAL FUNCTION PANEL
Albumin: 2.1 g/dL — ABNORMAL LOW (ref 3.5–5.0)
Anion gap: 11 (ref 5–15)
BUN: 22 mg/dL — ABNORMAL HIGH (ref 6–20)
CO2: 28 mmol/L (ref 22–32)
Calcium: 8.3 mg/dL — ABNORMAL LOW (ref 8.9–10.3)
Chloride: 103 mmol/L (ref 98–111)
Creatinine, Ser: 0.6 mg/dL (ref 0.44–1.00)
GFR, Estimated: >60 mL/min (ref >60)
Glucose, Bld: 171 mg/dL — ABNORMAL HIGH (ref 70–99)
Phosphorus: 3.8 mg/dL (ref 2.5–4.6)
Potassium: 3 mmol/L — ABNORMAL LOW (ref 3.5–5.1)
Sodium: 142 mmol/L (ref 135–145)

## 2020-12-19 LAB — CBC WITH DIFFERENTIAL/PLATELET
Abs Immature Granulocytes: 0.05 10*3/uL (ref 0.00–0.07)
Basophils Absolute: 0 10*3/uL (ref 0.0–0.1)
Basophils Relative: 0 %
Eosinophils Absolute: 0.3 10*3/uL (ref 0.0–0.5)
Eosinophils Relative: 3 %
HCT: 23.4 % — ABNORMAL LOW (ref 36.0–46.0)
Hemoglobin: 6.9 g/dL — ABNORMAL LOW (ref 12.0–15.0)
Immature Granulocytes: 1 %
Lymphocytes Relative: 15 %
Lymphs Abs: 1.4 10*3/uL (ref 0.7–4.0)
MCH: 24.8 pg — ABNORMAL LOW (ref 26.0–34.0)
MCHC: 29.5 g/dL — ABNORMAL LOW (ref 30.0–36.0)
MCV: 84.2 fL (ref 80.0–100.0)
Monocytes Absolute: 0.6 10*3/uL (ref 0.1–1.0)
Monocytes Relative: 6 %
Neutro Abs: 7 10*3/uL (ref 1.7–7.7)
Neutrophils Relative %: 75 %
Platelets: 234 10*3/uL (ref 150–400)
RBC: 2.78 MIL/uL — ABNORMAL LOW (ref 3.87–5.11)
RDW: 19.8 % — ABNORMAL HIGH (ref 11.5–15.5)
WBC: 9.3 10*3/uL (ref 4.0–10.5)
nRBC: 0 % (ref 0.0–0.2)

## 2020-12-19 LAB — IRON AND TIBC
Iron: 17 ug/dL — ABNORMAL LOW (ref 28–170)
Saturation Ratios: 7 % — ABNORMAL LOW (ref 10.4–31.8)
TIBC: 259 ug/dL (ref 250–450)
UIBC: 242 ug/dL

## 2020-12-19 LAB — GLUCOSE, CAPILLARY
Glucose-Capillary: 137 mg/dL — ABNORMAL HIGH (ref 70–99)
Glucose-Capillary: 147 mg/dL — ABNORMAL HIGH (ref 70–99)
Glucose-Capillary: 159 mg/dL — ABNORMAL HIGH (ref 70–99)
Glucose-Capillary: 121 mg/dL — ABNORMAL HIGH (ref 70–99)
Glucose-Capillary: 154 mg/dL — ABNORMAL HIGH (ref 70–99)

## 2020-12-19 LAB — PREPARE RBC (CROSSMATCH)

## 2020-12-19 LAB — RETICULOCYTES
Immature Retic Fract: 29.5 % — ABNORMAL HIGH (ref 2.3–15.9)
RBC.: 2.94 MIL/uL — ABNORMAL LOW (ref 3.87–5.11)
Retic Count, Absolute: 38.5 10*3/uL (ref 19.0–186.0)
Retic Ct Pct: 1.3 % (ref 0.4–3.1)

## 2020-12-19 LAB — MAGNESIUM: Magnesium: 2.2 mg/dL (ref 1.7–2.4)

## 2020-12-19 LAB — FOLATE: Folate: 29 ng/mL (ref >5.9)

## 2020-12-19 LAB — VITAMIN B12: Vitamin B-12: 505 pg/mL (ref 180–914)

## 2020-12-19 MED ORDER — FLUCONAZOLE 100 MG PO TABS
200.0000 mg | ORAL_TABLET | Freq: Once | ORAL | Status: AC
  Administered 2020-12-19: 200 mg
  Filled 2020-12-19 (×2): qty 2

## 2020-12-19 MED ORDER — SODIUM CHLORIDE 0.9% IV SOLUTION: Freq: Once | INTRAVENOUS | Status: AC

## 2020-12-19 MED ORDER — CLONAZEPAM 1 MG PO TABS
1.0000 mg | ORAL_TABLET | Freq: Two times a day (BID) | ORAL | Status: DC
  Administered 2020-12-19 – 2021-01-07 (×40): 1 mg
  Filled 2020-12-19 (×42): qty 1

## 2020-12-19 MED ORDER — CLONIDINE HCL 0.1 MG PO TABS
0.1000 mg | ORAL_TABLET | Freq: Four times a day (QID) | ORAL | Status: DC | PRN
  Administered 2020-12-24 – 2021-01-01 (×5): 0.1 mg
  Filled 2020-12-19 (×6): qty 1

## 2020-12-19 MED ORDER — POTASSIUM CHLORIDE 20 MEQ PO PACK
40.0000 meq | PACK | ORAL | Status: AC
Start: 2020-12-19 — End: 2020-12-19
  Administered 2020-12-19 (×2): 40 meq
  Filled 2020-12-19 (×2): qty 2

## 2020-12-19 MED ORDER — FLUCONAZOLE 100 MG PO TABS
100.0000 mg | ORAL_TABLET | Freq: Every day | ORAL | Status: AC
  Administered 2020-12-20 – 2020-12-25 (×6): 100 mg
  Filled 2020-12-19 (×6): qty 1

## 2020-12-19 NOTE — Progress Notes (Signed)
CRITICAL CARE NOTE  BACKGROUND:   CC  follow up respiratory failure  [name] with acute cardiac arrest Labs c/w severe acidosis and severe DKA Patient with witnessed V fib arrest and shocked in the field ROSC returned is unknown    12/07/2020 MRI Brain 1. Progressive DWI and FLAIR abnormality within the parietotemporal cortices and the bilateral corpus striatum, consistent with anoxic brain injury. 2. No hemorrhage or mass effect.    1/10 admitted to ICU for cardiac arrest, started hypothermia protocol 1/11 hypothermia protocol +posturing 1/12 signs of brain damage, needs NEURO consultation 1/13 failed SAT/SBT, unable to follow commands 1/14- for SBT today, electrolytes are improved. Encephalopathic working on mental status. 12/07/20-Patient remains crtically ill and is with interval worsening MRI brain, I met with husband to briefly review. Discussed case with Neurologist today and there is plan for meeting with family. 12/14/20- patient for LTAC placement s/p PEG/trache. No events today. 1/23severe brain damage 1/24 severe Brain Damage, severe agitation 1/25 severe brain damage  SUBJECTIVE:  Following few commands and nodding appropriately   OBJECTIVE   VITALS:  BP (!) 153/85   Pulse (!) 103   Temp 100.1 F (37.8 C) (Oral)   Resp (!) 24   Ht 5' 0.98" (1.549 m)   Wt 90.9 kg   SpO2 100%   BMI 37.88 kg/m    I/O last 3 completed shifts: In: 1771.8 [I.V.:695.6; NG/GT:480; IV Piggyback:596.3] Out: 1450 [Urine:1450] No intake/output data recorded.  SpO2: 100 % FiO2 (%): 30 %  Estimated body mass index is 37.88 kg/m as calculated from the following:   Height as of this encounter: 5' 0.98" (1.549 m).   Weight as of this encounter: 90.9 kg.  PHYSICAL EXAMINATION:  Physical Exam Constitutional:      Appearance: She is ill-appearing.  HENT:     Head: Normocephalic and atraumatic.  Eyes:     Extraocular Movements: Extraocular movements intact.      Pupils: Pupils are equal, round, and reactive to light.  Cardiovascular:     Rate and Rhythm: Tachycardia present. Occasional extrasystoles are present.    Pulses: Normal pulses.     Heart sounds: Normal heart sounds.  Abdominal:     General: Abdomen is protuberant. Bowel sounds are normal.     Palpations: Abdomen is soft.     Tenderness: There is no abdominal tenderness. There is no guarding or rebound.  Musculoskeletal:     Right lower leg: 1+ Pitting Edema present.     Left lower leg: 1+ Pitting Edema present.  Neurological:     GCS: GCS eye subscore is 4. GCS motor subscore is 6.     Cranial Nerves: Cranial nerves are intact.     CRITICAL CARE CHECKLIST:   MEDICATIONS: I have reviewed all medications and confirmed regimen as documented   Nutrition Status: Nutrition Problem: Inadequate oral intake Etiology: inability to eat Signs/Symptoms: NPO status Interventions: Tube feeding,Prostat,MVI     Indwelling Urinary Catheter continued, requirement due to   Reason to continue Indwelling Urinary Catheter strict Intake/Output monitoring for hemodynamic instability         Ventilator continued, requirement due to severe respiratory failure   Ventilator Sedation RASS 0 to -2     DVT/GI PRX ordered and assessed  TRANSFUSIONS AS NEEDED  MONITOR FSBS  I Assessed the need for Labs  I Assessed the need for Foley  I Assessed the need for Central Venous Line  Family Discussion when available  I Assessed the need  for Mobilization  I made an Assessment of medications to be adjusted accordingly  Safety Risk assessment completed   ASSESSMENT AND PLAN SYNOPSIS  55 yo morbidly obese [name] with acute cardiac arrest with acute and severe hypoxic resp failure with severe brain damage s/p trach and PEG  RESPIRATORY: Severe ACUTE Hypoxic and Hypercapnic Respiratory Failure  -continue Full MV support wean to PS as tolerated  -continue Bronchodilator Therapy  -Wean Fio2 and PEEP as  tolerated  -will perform SAT/SBT when respiratory parameters are met  -VAP/VENT bundle implementation  -Morbid obesity, possible OSA.    -Will certainly impact respiratory mechanics  -Attempt supportive PSV (not SBT) today with decremental PS wean  12/11/20 CXR low lung volumes, cardiomegaly, and cephalization, cannot exclude pna  CARDIAC:  -EF 60-65%, mild LVH, no LAE, no hemodynamic valvular disease  -ICU monitoring  -oxygen as needed  -Lasix as tolerated  -cardiac markers have waxed/waned but have sharply fallen from peak 947  -prn clonidine for HTN with removal of protracted precedex course   GI:  GI PROPHYLAXIS as indicated  RENAL:  Intake/Output Summary (Last 24 hours) at 12/19/2020 1225 Last data filed at 12/19/2020 0400 Gross per 24 hour  Intake 1071.33 ml  Output 400 ml  Net 671.33 ml  Net IO Since Admission: 4,939.59 mL [12/19/20 1225] Lab Results  Component Value Date   CREATININE 0.60 12/19/2020   BUN 22 (H) 12/19/2020   NA 142 12/19/2020   K 3.0 (L) 12/19/2020   CL 103 12/19/2020   CO2 28 12/19/2020    Renal indices stable  Remains net positive  Diurese as able for even balance  HEME:  Lab Results  Component Value Date   WBC 9.3 12/19/2020   HGB 6.9 (L) 12/19/2020   HCT 23.4 (L) 12/19/2020   MCV 84.2 12/19/2020   PLT 234 12/19/2020  . Lab Results  Component Value Date   INR 1.0 12/02/2020   INR 1.0 12/02/2020  No results found for: DDIMER    Will transfuse one unit PRBC today for HgB < 7   F/E/N:  -TF's as tolerated, FWF 200cc every 4h, sodium corrected  -Bowel protocol as indicated  -follow labs as needed  -replace electrolytes as needed, hypoK+ today  -nutrition consultation and following  ENDO  - will use ICU hypoglycemic\Hyperglycemia protocol if indicated   NEUROLOGY  Acute toxic metabolic encephalopathy,   Anoxic brain injury  Following commands and nodding  Remove Precedex    CASE DISCUSSED IN MULTIDISCIPLINARY ROUNDS WITH  ICU TEAM   Critical Care Time devoted to patient care services described in this note is 45 minutes.  Overall, patient is critically ill, prognosis is guarded.   Patient with Multiorgan failure and at high risk for cardiac arrest and death.  Images reviewed directly and interpretation in A&P is my own unless noted Labs reviewed and evaluated as noted in A/P

## 2020-12-19 NOTE — Progress Notes (Signed)
[place] for Fluconazole Indication: Oropharangeal candidiasis  No Known Allergies  Patient Measurements: Height: 5' 0.98" (154.9 cm) Weight: 90.9 kg (200 lb 6.4 oz) IBW/kg (Calculated) : 47.76   Vital Signs: Temp: 100 F (37.8 C) (01/27 0400) Temp Source: Oral (01/27 0400) BP: 91/52 (01/27 0400) Pulse Rate: 66 (01/27 0400) Intake/Output from previous day: 01/26 0701 - 01/27 0700 In: 1235 [I.V.:478.8; NG/GT:360; IV Piggyback:396.2] Out: 650 [Urine:650] Intake/Output from this shift: No intake/output data recorded.  Labs: Recent Labs    12/17/20 0438 12/17/20 0439 12/17/20 0800 12/18/20 0530 12/19/20 0426 12/19/20 0427  WBC 9.3  --   --  9.6 9.3  --   HGB 6.7*  --  7.1* 7.1* 6.9*  --   HCT 23.3*  --  24.0* 24.3* 23.4*  --   PLT 198  --   --  246 234  --   CREATININE  --  0.68  --  0.58  --  0.60  MG 2.0  --   --  1.6* 2.2  --   PHOS  --  4.0  --  3.5  --  3.8  ALBUMIN  --  2.1*  --  2.2*  --  2.1*   Estimated Creatinine Clearance: 82.5 mL/min (by C-G formula based on SCr of 0.6 mg/dL).   Microbiology: Recent Results (from the past 720 hour(s))  Resp Panel by RT-PCR (Flu A&B, Covid) Nasopharyngeal Swab     Status: None   Collection Time: 12/02/20  7:23 AM   Specimen: Nasopharyngeal Swab; Nasopharyngeal(NP) swabs in vial transport medium  Result Value Ref Range Status   SARS Coronavirus 2 by RT PCR NEGATIVE NEGATIVE Final    Comment: (NOTE) SARS-CoV-2 target nucleic acids are NOT DETECTED.  The SARS-CoV-2 RNA is generally detectable in upper respiratory specimens during the acute phase of infection. The lowest concentration of SARS-CoV-2 viral copies this assay can detect is 138 copies/mL. A negative result does not preclude SARS-Cov-2 infection and should not be used as the sole basis for treatment or other patient management decisions. A negative result may occur with  improper specimen collection/handling,  submission of specimen other than nasopharyngeal swab, presence of viral mutation(s) within the areas targeted by this assay, and inadequate number of viral copies(<138 copies/mL). A negative result must be combined with clinical observations, patient history, and epidemiological information. The expected result is Negative.  Fact Sheet for Patients:  EntrepreneurPulse.com.au  Fact Sheet for Healthcare Providers:  IncredibleEmployment.be  This test is no t yet approved or cleared by the [place] FDA and  has been authorized for detection and/or diagnosis of SARS-CoV-2 by FDA under an Emergency Use Authorization (EUA). This EUA will remain  in effect (meaning this test can be used) for the duration of the COVID-19 declaration under Section 564(b)(1) of the Act, 21 U.S.C.section 360bbb-3(b)(1), unless the authorization is terminated  or revoked sooner.       Influenza A by PCR NEGATIVE NEGATIVE Final   Influenza B by PCR NEGATIVE NEGATIVE Final    Comment: (NOTE) The Xpert Xpress SARS-CoV-2/FLU/RSV plus assay is intended as an aid in the diagnosis of influenza from Nasopharyngeal swab specimens and should not be used as a sole basis for treatment. Nasal washings and aspirates are unacceptable for Xpert Xpress SARS-CoV-2/FLU/RSV testing.  Fact Sheet for Patients: EntrepreneurPulse.com.au  Fact Sheet for Healthcare Providers: IncredibleEmployment.be  This test is not yet approved or cleared by the [place] FDA and has  been authorized for detection and/or diagnosis of SARS-CoV-2 by FDA under an Emergency Use Authorization (EUA). This EUA will remain in effect (meaning this test can be used) for the duration of the COVID-19 declaration under Section 564(b)(1) of the Act, 21 U.S.C. section 360bbb-3(b)(1), unless the authorization is terminated or revoked.  Performed at [facility], [address], [place], [place] [address]   Urine culture     Status: None   Collection Time: 12/02/20  7:23 AM   Specimen: In/Out Cath Urine  Result Value Ref Range Status   Specimen Description   Final    IN/OUT CATH URINE Performed at [facility], [address], [place], [place] [address]    Special Requests   Final    NONE Performed at [facility], [address] [place], [place] [address]    Culture   Final    NO GROWTH Performed at [facility], [place] [address] [place], [place] [address]    Report Status 12/03/2020 FINAL  Final  Blood Culture (routine x 2)     Status: None   Collection Time: 12/02/20  8:37 AM   Specimen: BLOOD  Result Value Ref Range Status   Specimen Description BLOOD BLOOD RIGHT HAND  Final   Special Requests   Final    BOTTLES DRAWN AEROBIC AND ANAEROBIC Blood Culture adequate volume   Culture   Final    NO GROWTH 5 DAYS Performed at [facility], [address], [place], [place] [address]    Report Status 12/07/2020 FINAL  Final  Blood Culture (routine x 2)     Status: None   Collection Time: 12/02/20  8:37 AM   Specimen: BLOOD  Result Value Ref Range Status   Specimen Description BLOOD RIGHT ARM  Final   Special Requests   Final    BOTTLES DRAWN AEROBIC AND ANAEROBIC Blood Culture adequate volume   Culture   Final    NO GROWTH 5 DAYS Performed at [facility], [address], [place], [place] [address]    Report Status 12/07/2020 FINAL  Final  MRSA PCR Screening     Status: None   Collection Time: 12/02/20  9:33 AM   Specimen: Nasopharyngeal  Result Value Ref Range Status   MRSA by PCR NEGATIVE NEGATIVE Final    Comment:        The GeneXpert MRSA Assay (FDA approved for NASAL specimens only), is one component of a comprehensive MRSA colonization surveillance program. It is not intended to diagnose MRSA infection nor to guide or monitor treatment for MRSA infections. Performed at  [facility], [address] [place], [place] [address]   Culture, blood (routine x 2)     Status: None   Collection Time: 12/10/20 11:28 AM   Specimen: BLOOD  Result Value Ref Range Status   Specimen Description BLOOD LINE  Final   Special Requests   Final    BOTTLES DRAWN AEROBIC AND ANAEROBIC Blood Culture adequate volume   Culture   Final    NO GROWTH 5 DAYS Performed at [facility], [address] [place], [place] [address]    Report Status 12/15/2020 FINAL  Final  Culture, blood (routine x 2)     Status: None   Collection Time: 12/10/20 11:38 AM   Specimen: BLOOD RIGHT HAND  Result Value Ref Range Status   Specimen Description BLOOD RIGHT HAND  Final   Special Requests   Final  BOTTLES DRAWN AEROBIC AND ANAEROBIC Blood Culture adequate volume   Culture   Final    NO GROWTH 5 DAYS Performed at [facility], [address] [place], [place] [address]    Report Status 12/15/2020 FINAL  Final  Culture, respiratory (non-expectorated)     Status: None   Collection Time: 12/10/20  2:12 PM   Specimen: Tracheal Aspirate; Respiratory  Result Value Ref Range Status   Specimen Description   Final    TRACHEAL ASPIRATE Performed at [facility], [address] [place], [place] [address]    Special Requests   Final    NONE Performed at [facility], [place], [place] [address]    Gram Stain   Final    RARE WBC PRESENT, PREDOMINANTLY PMN ABUNDANT GRAM POSITIVE COCCI FEW GRAM POSITIVE RODS RARE GRAM NEGATIVE RODS Performed at [facility], [place] [address] [place], [place] [address]    Culture ABUNDANT STAPHYLOCOCCUS AUREUS  Final   Report Status 12/13/2020 FINAL  Final   Organism ID, Bacteria STAPHYLOCOCCUS AUREUS  Final      Susceptibility   Staphylococcus aureus - MIC*    CIPROFLOXACIN <=0.5 SENSITIVE Sensitive     ERYTHROMYCIN <=0.25 SENSITIVE Sensitive     GENTAMICIN <=0.5 SENSITIVE Sensitive      OXACILLIN <=0.25 SENSITIVE Sensitive     TETRACYCLINE <=1 SENSITIVE Sensitive     VANCOMYCIN 1 SENSITIVE Sensitive     TRIMETH/SULFA <=10 SENSITIVE Sensitive     CLINDAMYCIN <=0.25 SENSITIVE Sensitive     RIFAMPIN <=0.5 SENSITIVE Sensitive     Inducible Clindamycin NEGATIVE Sensitive     * ABUNDANT STAPHYLOCOCCUS AUREUS  Urine Culture     Status: Abnormal   Collection Time: 12/10/20  2:30 PM   Specimen: Urine, Random  Result Value Ref Range Status   Specimen Description   Final    URINE, RANDOM Performed at [facility], [address], [place], [place] [address]    Special Requests   Final    NONE Performed at [facility], [address] [place], [place] [address]    Culture 30,000 COLONIES/mL ENTEROCOCCUS FAECALIS (A)  Final   Report Status 12/13/2020 FINAL  Final   Organism ID, Bacteria ENTEROCOCCUS FAECALIS (A)  Final      Susceptibility   Enterococcus faecalis - MIC*    AMPICILLIN <=2 SENSITIVE Sensitive     NITROFURANTOIN <=16 SENSITIVE Sensitive     VANCOMYCIN 2 SENSITIVE Sensitive     * 30,000 COLONIES/mL ENTEROCOCCUS FAECALIS  Urine Culture     Status: Abnormal   Collection Time: 12/11/20  4:40 PM   Specimen: Urine, Random  Result Value Ref Range Status   Specimen Description   Final    URINE, RANDOM Performed at [facility], [address] [place],  [address]    Special Requests   Final    NONE Performed at [facility], [address], [place],  [address]    Culture >=100,000 COLONIES/mL ENTEROCOCCUS FAECALIS (A)  Final   Report Status 12/14/2020 FINAL  Final   Organism ID, Bacteria ENTEROCOCCUS FAECALIS (A)  Final      Susceptibility   Enterococcus faecalis - MIC*    AMPICILLIN <=2 SENSITIVE Sensitive     NITROFURANTOIN <=16 SENSITIVE Sensitive     VANCOMYCIN 2 SENSITIVE Sensitive     * >=100,000 COLONIES/mL ENTEROCOCCUS FAECALIS    Plan:  Ordered Fluconazole 200mg  once, followed by 100mg   daily.  Recommended duration of therapy is 7 to 14 day. Pharmacy will continue to monitor for potential DDI with Fluconazole and will adjust dose, route, and/or discontinue when appropriate.  Renda Rolls, PharmD, [facility] 12/19/2020 7:23 AM

## 2020-12-19 NOTE — Progress Notes (Signed)
[place] for Electrolyte Monitoring and Replacement   Recent Labs: Potassium (mmol/L)  Date Value  12/19/2020 3.0 (L)  02/20/2013 3.4 (L)   Magnesium (mg/dL)  Date Value  12/19/2020 2.2   Calcium (mg/dL)  Date Value  12/19/2020 8.3 (L)   Calcium, Total (mg/dL)  Date Value  02/20/2013 8.7   Albumin (g/dL)  Date Value  12/19/2020 2.1 (L)  02/20/2013 3.2 (L)   Phosphorus (mg/dL)  Date Value  12/19/2020 3.8   Sodium (mmol/L)  Date Value  12/19/2020 142  02/20/2013 137   Corrected Ca: 9.8 mg/dL  Assessment: 55 year old female presented to the ED via EMS post witnessed cardiac arrest. ROSC achieved following 3 defibrillations and one round of epinephrine. Patient found to have significant hypokalemia on arrival. Given witnessed arrest, patient Code ICE. Pharmacy to manage electrolytes.   Nutrition: ProSource BID + Vital 40 mL/hr  Goal of Therapy:  Potassium 4.0 - 5.1 mmol/L Magnesium 2.1 - 2.4 mg/dL All Other Electrolytes WNL  Plan:   Na 149 >> 142, continue free water flushes 200 mL q4h (1.2 L/day)  K 3, provider ordered KCl 40 mEq x 2 per tube  Follow up with morning labs  Benita Gutter 12/19/2020 8:46 AM

## 2020-12-19 NOTE — Progress Notes (Signed)
Neuro: stable, intermittently nods/shakes head to answer questions appropriately, will follow simple commands i.e wiggle toes and open/close mouth Resp: stable on vent settings, very sensitive gag CV: TMAX 100.3, PRN hydral x 1 for SBP>190-resolved and within normal limits following, some generalized edema GIGU: purwick in place, flexiseal placed due to 5 watery BMs tonight, PEG tube in place, continuous feeds tolerated well Skin: clean and intact, multiple watery bowel movements put her at risk for breakdown-flexiseal placed Social: No contact with family.

## 2020-12-20 ENCOUNTER — Inpatient Hospital Stay: Payer: Self-pay

## 2020-12-20 LAB — TYPE AND SCREEN
ABO/RH(D): B POS
Antibody Screen: NEGATIVE
Unit division: 0

## 2020-12-20 LAB — RENAL FUNCTION PANEL
Albumin: 1.8 g/dL — ABNORMAL LOW (ref 3.5–5.0)
Anion gap: 9 (ref 5–15)
BUN: 13 mg/dL (ref 6–20)
CO2: 23 mmol/L (ref 22–32)
Calcium: 6.5 mg/dL — ABNORMAL LOW (ref 8.9–10.3)
Chloride: 113 mmol/L — ABNORMAL HIGH (ref 98–111)
Creatinine, Ser: 0.38 mg/dL — ABNORMAL LOW (ref 0.44–1.00)
GFR, Estimated: >60 mL/min (ref >60)
Glucose, Bld: 136 mg/dL — ABNORMAL HIGH (ref 70–99)
Phosphorus: 3 mg/dL (ref 2.5–4.6)
Potassium: 2.7 mmol/L — CL (ref 3.5–5.1)
Sodium: 145 mmol/L (ref 135–145)

## 2020-12-20 LAB — CBC WITH DIFFERENTIAL/PLATELET
Abs Immature Granulocytes: 0.04 10*3/uL (ref 0.00–0.07)
Basophils Absolute: 0.1 10*3/uL (ref 0.0–0.1)
Basophils Relative: 1 %
Eosinophils Absolute: 0.4 10*3/uL (ref 0.0–0.5)
Eosinophils Relative: 4 %
HCT: 25.7 % — ABNORMAL LOW (ref 36.0–46.0)
Hemoglobin: 7.7 g/dL — ABNORMAL LOW (ref 12.0–15.0)
Immature Granulocytes: 0 %
Lymphocytes Relative: 13 %
Lymphs Abs: 1.3 10*3/uL (ref 0.7–4.0)
MCH: 25.4 pg — ABNORMAL LOW (ref 26.0–34.0)
MCHC: 30 g/dL (ref 30.0–36.0)
MCV: 84.8 fL (ref 80.0–100.0)
Monocytes Absolute: 0.4 10*3/uL (ref 0.1–1.0)
Monocytes Relative: 5 %
Neutro Abs: 7.3 10*3/uL (ref 1.7–7.7)
Neutrophils Relative %: 77 %
Platelets: 183 10*3/uL (ref 150–400)
RBC: 3.03 MIL/uL — ABNORMAL LOW (ref 3.87–5.11)
RDW: 18.4 % — ABNORMAL HIGH (ref 11.5–15.5)
Smear Review: DECREASED
WBC: 9.5 10*3/uL (ref 4.0–10.5)
nRBC: 0 % (ref 0.0–0.2)

## 2020-12-20 LAB — BPAM RBC
Blood Product Expiration Date: 202202212359
ISSUE DATE / TIME: 202201271740
Unit Type and Rh: 7300

## 2020-12-20 LAB — GLUCOSE, CAPILLARY
Glucose-Capillary: 126 mg/dL — ABNORMAL HIGH (ref 70–99)
Glucose-Capillary: 133 mg/dL — ABNORMAL HIGH (ref 70–99)
Glucose-Capillary: 136 mg/dL — ABNORMAL HIGH (ref 70–99)
Glucose-Capillary: 145 mg/dL — ABNORMAL HIGH (ref 70–99)
Glucose-Capillary: 152 mg/dL — ABNORMAL HIGH (ref 70–99)
Glucose-Capillary: 159 mg/dL — ABNORMAL HIGH (ref 70–99)
Glucose-Capillary: 169 mg/dL — ABNORMAL HIGH (ref 70–99)

## 2020-12-20 LAB — POTASSIUM: Potassium: 4 mmol/L (ref 3.5–5.1)

## 2020-12-20 LAB — MAGNESIUM: Magnesium: 1.5 mg/dL — ABNORMAL LOW (ref 1.7–2.4)

## 2020-12-20 IMAGING — DX DG CHEST 1V PORT
1 series · 1 of 1 positions shown · non-contrast
Comparison: [DATE]

CLINICAL DATA: Tachypnea

EXAM:
PORTABLE CHEST 1 VIEW

[chest ap]
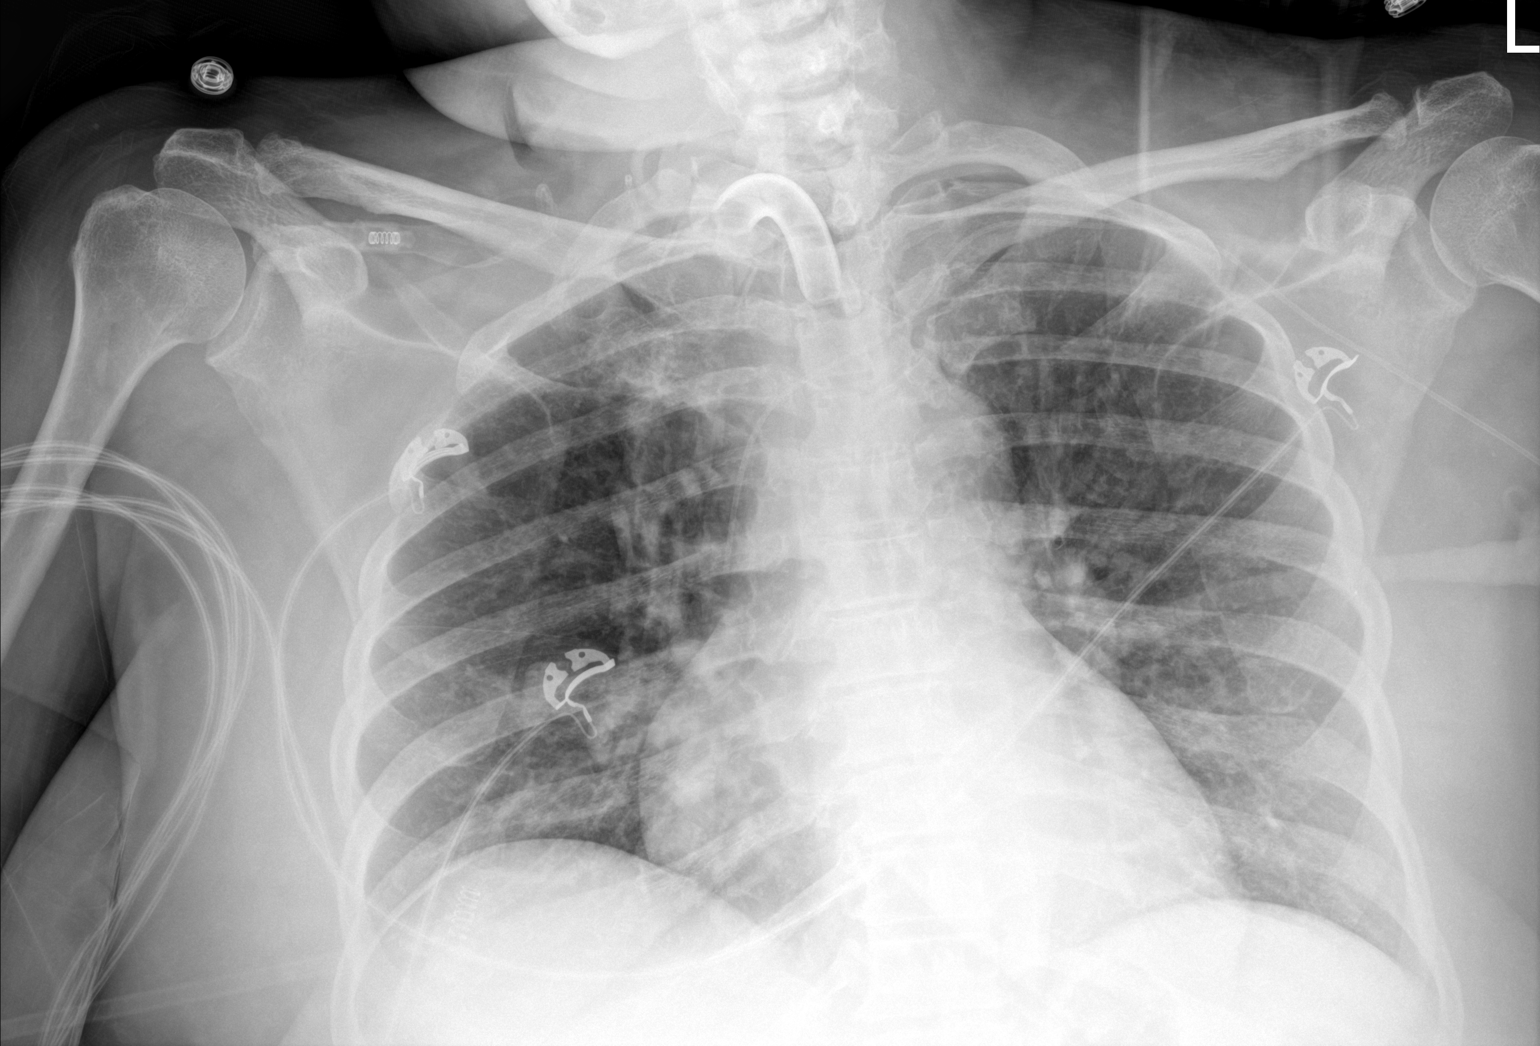

[1 of 1 positions shown; findings below may reference images not displayed]

FINDINGS: Tracheostomy has been placed in good position. Left arm PICC tip in
the mid SVC.

Improved aeration lungs. Mild bilateral airspace disease remains. No
effusion.
IMPRESSION: Mild bibasilar airspace disease possible pneumonia or atelectasis

Satisfactory tracheostomy

Satisfactory PICC placement

## 2020-12-20 MED ORDER — MAGNESIUM SULFATE 4 GM/100ML IV SOLN
4.0000 g | Freq: Once | INTRAVENOUS | Status: AC
  Administered 2020-12-20: 4 g via INTRAVENOUS
  Filled 2020-12-20: qty 100

## 2020-12-20 MED ORDER — HYDRALAZINE HCL 20 MG/ML IJ SOLN
10.0000 mg | INTRAMUSCULAR | Status: DC | PRN
  Administered 2020-12-20 – 2020-12-29 (×4): 10 mg via INTRAVENOUS
  Filled 2020-12-20 (×4): qty 1

## 2020-12-20 MED ORDER — POTASSIUM CHLORIDE 10 MEQ/50ML IV SOLN
10.0000 meq | INTRAVENOUS | Status: AC
  Administered 2020-12-20 (×5): 10 meq via INTRAVENOUS
  Filled 2020-12-20 (×5): qty 50

## 2020-12-20 MED ORDER — HYDROMORPHONE HCL 1 MG/ML IJ SOLN
1.0000 mg | INTRAMUSCULAR | Status: DC | PRN
  Administered 2020-12-21 – 2020-12-26 (×3): 1 mg via INTRAVENOUS
  Filled 2020-12-20 (×3): qty 1

## 2020-12-20 MED ORDER — POTASSIUM CHLORIDE 20 MEQ PO PACK
40.0000 meq | PACK | Freq: Two times a day (BID) | ORAL | Status: AC
  Administered 2020-12-20 (×2): 40 meq
  Filled 2020-12-20 (×2): qty 2

## 2020-12-20 MED ORDER — HYDROMORPHONE HCL 1 MG/ML IJ SOLN
INTRAMUSCULAR | Status: AC
  Filled 2020-12-20: qty 1

## 2020-12-20 MED ORDER — HYDROMORPHONE HCL 1 MG/ML IJ SOLN
1.0000 mg | Freq: Once | INTRAMUSCULAR | Status: AC
  Administered 2020-12-20: 1 mg via INTRAVENOUS
  Filled 2020-12-20: qty 1

## 2020-12-20 MED ORDER — HYDROMORPHONE HCL 1 MG/ML IJ SOLN
1.0000 mg | Freq: Once | INTRAMUSCULAR | Status: AC
  Administered 2020-12-20: 1 mg via INTRAVENOUS

## 2020-12-20 NOTE — Progress Notes (Signed)
CRITICAL CARE NOTE  BACKGROUND:   CC  follow up respiratory failure  [name] with acute cardiac arrest Labs c/w severe acidosis and severe DKA Patient with witnessed V fib arrest and shocked in the field ROSC returned is unknown    12/07/2020 MRI Brain 1. Progressive DWI and FLAIR abnormality within the parietotemporal cortices and the bilateral corpus striatum, consistent with anoxic brain injury. 2. No hemorrhage or mass effect.    1/10 admitted to ICU for cardiac arrest, started hypothermia protocol 1/11 hypothermia protocol +posturing 1/12 signs of brain damage, needs NEURO consultation 1/13 failed SAT/SBT, unable to follow commands 1/14- for SBT today, electrolytes are improved. Encephalopathic working on mental status. 12/07/20-Patient remains crtically ill and is with interval worsening MRI brain, I met with husband to briefly review. Discussed case with Neurologist today and there is plan for meeting with family. 12/14/20- patient for LTAC placement s/p PEG/trache. No events today. 1/23severe brain damage 1/24 severe Brain Damage, severe agitation 1/25 severe brain damage 1/27 bouts of tachypnea  SUBJECTIVE:  Following few commands and nodding appropriately   OBJECTIVE   VITALS:  BP (!) 153/108   Pulse 89   Temp 99.6 F (37.6 C) (Oral)   Resp (!) 28   Ht 5' 0.98" (1.549 m)   Wt 90.9 kg   SpO2 96%   BMI 37.88 kg/m    I/O last 3 completed shifts: In: 1712.6 [I.V.:752.6; NG/GT:360; IV [id number]:017] Out: 2650 [Urine:2300; Stool:350] Total I/O In: 4931.6 [I.V.:127.8; NG/GT:4280; IV Piggyback:523.8] Out: 1400 [Urine:1400]  SpO2: 96 % FiO2 (%): 30 %  Estimated body mass index is 37.88 kg/m as calculated from the following:   Height as of this encounter: 5' 0.98" (1.549 m).   Weight as of this encounter: 90.9 kg.  PHYSICAL EXAMINATION:  Physical Exam Constitutional:      Appearance: She is ill-appearing.  HENT:     Head: Normocephalic  and atraumatic.  Eyes:     Extraocular Movements: Extraocular movements intact.     Pupils: Pupils are equal, round, and reactive to light.  Cardiovascular:     Rate and Rhythm: Tachycardia present. Occasional extrasystoles are present.    Pulses: Normal pulses.     Heart sounds: Normal heart sounds.  Abdominal:     General: Abdomen is protuberant. Bowel sounds are normal.     Palpations: Abdomen is soft.     Tenderness: There is no abdominal tenderness. There is no guarding or rebound.  Musculoskeletal:     Right lower leg: 1+ Pitting Edema present.     Left lower leg: 1+ Pitting Edema present.  Neurological:     GCS: GCS eye subscore is 4. GCS motor subscore is 6.     Cranial Nerves: Cranial nerves are intact.     CRITICAL CARE CHECKLIST:   MEDICATIONS: I have reviewed all medications and confirmed regimen as documented   Nutrition Status: Nutrition Problem: Inadequate oral intake Etiology: inability to eat Signs/Symptoms: NPO status Interventions: Tube feeding,Prostat,MVI    Indwelling Urinary Catheter continued, requirement due to   Reason to continue Indwelling Urinary Catheter strict Intake/Output monitoring for hemodynamic instability         Ventilator continued, requirement due to severe respiratory failure   Ventilator Sedation RASS 0 to -2     DVT/GI PRX ordered and assessed  TRANSFUSIONS AS NEEDED  MONITOR FSBS  I Assessed the need for Labs  I Assessed the need for Foley  I Assessed the need for Central Venous Line  Family Discussion when available  I Assessed the need for Mobilization  I made an Assessment of medications to be adjusted accordingly  Safety Risk assessment completed   ASSESSMENT AND PLAN SYNOPSIS  55 yo morbidly obese [name] with acute cardiac arrest with acute and severe hypoxic resp failure with severe brain damage s/p trach and PEG  RESPIRATORY: Severe ACUTE Hypoxic and Hypercapnic Respiratory Failure  -continue Full MV support  wean to PS as tolerated  -continue Bronchodilator Therapy  -Wean Fio2 and PEEP as tolerated  -VAP/VENT bundle implementation  -Morbid obesity. Will certainly impact respiratory mechanics  -PCV Automode with for comfort and ability to convert to PSV ad lib  12/11/20 CXR low lung volumes, cardiomegaly, and cephalization, cannot exclude pna  CARDIAC:  -EF 60-65%, mild LVH, no LAE, no hemodynamic valvular disease  -ICU monitoring  -oxygen as needed  -Lasix as tolerated  -cardiac markers have waxed/waned but have sharply fallen from peak 947  -prn clonidine for HTN with removal of protracted precedex course   GI:  GI PROPHYLAXIS as indicated  RENAL:  Intake/Output Summary (Last 24 hours) at 12/20/2020 1759 Last data filed at 12/20/2020 1700 Gross per 24 hour  Intake 5959.76 ml  Output 3050 ml  Net 2909.76 ml  Net IO Since Admission: 6,849.35 mL [12/20/20 1759] Lab Results  Component Value Date   CREATININE 0.38 (L) 12/20/2020   BUN 13 12/20/2020   NA 145 12/20/2020   K 4.0 12/20/2020   CL 113 (H) 12/20/2020   CO2 23 12/20/2020    Renal indices stable  Remains net positive  Diurese as able for even balance  HEME:  Lab Results  Component Value Date   WBC 9.5 12/20/2020   HGB 7.7 (L) 12/20/2020   HCT 25.7 (L) 12/20/2020   MCV 84.8 12/20/2020   PLT 183 12/20/2020  . Lab Results  Component Value Date   INR 1.0 12/02/2020   INR 1.0 12/02/2020  No results found for: DDIMER    Transfused one unit PRBC yesterday  for HgB < 7   F/E/N:  -TF's as tolerated, FWF 200cc every 4h, sodium corrected  -Bowel protocol as indicated  -follow labs as needed  -replace electrolytes as needed, hypoK+ today  -nutrition consultation and following  ENDO  - will use ICU hypoglycemic\Hyperglycemia protocol if indicated   NEUROLOGY  Acute toxic metabolic encephalopathy,   Anoxic brain injury  Following commands and nodding  Remove Precedex, Klonipin for anxiety  PRN Dilaudid for  intermittent tachypnea     CASE DISCUSSED IN MULTIDISCIPLINARY ROUNDS WITH ICU TEAM   Critical Care Time devoted to patient care services described in this note is 35 minutes.  Overall, patient is critically ill, prognosis is guarded.   Patient with Multiorgan failure and at high risk for cardiac arrest and death.  Images reviewed directly and interpretation in A&P is my own unless noted Labs reviewed and evaluated as noted in A/P

## 2020-12-20 NOTE — TOC Progression Note (Signed)
Transition of Care [facility]) - Progression Note    Patient Details  Name: [name] [name] MRN: [id number] Date of Birth: January 09, 1966  [facility] CM/SW [place], [place] Phone Number: [phone] 12/20/2020, 4:16 PM  Clinical Narrative:     CSW spoke at length with patient's Umeda,Darrell (Spouse)  [phone] about patient disposition.  CSW went over the different possible scenarios depending on whether the patient has health insurance or not. Mr. Brewton is still trying to verify if the patient has health insurance.  Mr. Everhart would like for CSW to assist with finding the cost of private pay care and DME rental.  He stated the family is willing to assist with patient's long term care.  CSW stated she would gather the information for him and speak with him about it on Monday 11/25/2020.   Expected Discharge Plan: Long Term Nursing Home Barriers to Discharge: Inadequate or no insurance,Continued Medical Work up,Financial Resources  Expected Discharge Plan and Services Expected Discharge Plan: Long Term Nursing Home In-house Referral: Clinical Social Work   Post Acute Care Choice: Nursing Home Living arrangements for the past 2 months: Single Family Home                                       Social Determinants of Health (SDOH) Interventions    Readmission Risk Interventions No flowsheet data found.

## 2020-12-20 NOTE — Op Note (Signed)
Pre-Op Dx: dysphagia Post-Op Dx: same Anesthesia: MAC EBL: minimal Complications:  none apparent Specimen: none Procedure: PEG tube placement Surgeon: Lysle Pearl Co-Surgeon: [facility] Indications for procedure: Altered mental status not allowing patient to swallow, requiring PEG.  Please see locklear consult note for further details.  Description of Procedure:  Patient positioned at bedside.  Timeout performed.  Abdomen prepped at area where transillumination of EGD was noted.  Skin incision made and catehter needle placed into stomach under direct visualization.  Needle removed, guidewire placed through catheter and snared by Dr. Haig Prophet.  Once PEG tube secured to guidewire by him, PEG tube was pulled through skin with ease.  Dr. Haig Prophet confirmed proper placement within stomach using EGD.  PEG tube stopper placed and secured at 5cm mark at skin.  Pt tolerated procedure well.

## 2020-12-20 NOTE — Progress Notes (Signed)
[place] for Electrolyte Monitoring and Replacement   Recent Labs: Potassium (mmol/L)  Date Value  12/20/2020 2.7 (LL)  02/20/2013 3.4 (L)   Magnesium (mg/dL)  Date Value  12/20/2020 1.5 (L)   Calcium (mg/dL)  Date Value  12/20/2020 6.5 (L)   Calcium, Total (mg/dL)  Date Value  02/20/2013 8.7   Albumin (g/dL)  Date Value  12/20/2020 1.8 (L)  02/20/2013 3.2 (L)   Phosphorus (mg/dL)  Date Value  12/20/2020 3.0   Sodium (mmol/L)  Date Value  12/20/2020 145  02/20/2013 137   Corrected Ca: 8.3 mg/dL  Assessment: 55 year old female presented to the ED via EMS post witnessed cardiac arrest. ROSC achieved following 3 defibrillations and one round of epinephrine. Patient found to have significant hypokalemia on arrival. Given witnessed arrest, patient Code ICE. Pharmacy to manage electrolytes.   Nutrition: ProSource BID + Vital 40 mL/hr  Goal of Therapy:  Potassium 4.0 - 5.1 mmol/L Magnesium 2.1 - 2.4 mg/dL All Other Electrolytes WNL  Plan:   Na 145, continue free water flushes 200 mL q4h (1.2 L/day)  K 2.7, IV KCl 10 mEq x 5 + KCl 40 mEq per tube x 2  Mg 1.5, IV magnesium sulfate 4 g x 1  Follow up with morning labs  Benita Gutter 12/20/2020 3:39 PM

## 2020-12-20 NOTE — Progress Notes (Signed)
Pt placed back on resting settings at this time due to RR in mid 40's

## 2020-12-21 LAB — CBC WITH DIFFERENTIAL/PLATELET
Abs Immature Granulocytes: 0.04 10*3/uL (ref 0.00–0.07)
Basophils Absolute: 0.1 10*3/uL (ref 0.0–0.1)
Basophils Relative: 1 %
Eosinophils Absolute: 0.4 10*3/uL (ref 0.0–0.5)
Eosinophils Relative: 4 %
HCT: 31.5 % — ABNORMAL LOW (ref 36.0–46.0)
Hemoglobin: 9.6 g/dL — ABNORMAL LOW (ref 12.0–15.0)
Immature Granulocytes: 0 %
Lymphocytes Relative: 18 %
Lymphs Abs: 1.7 10*3/uL (ref 0.7–4.0)
MCH: 25.7 pg — ABNORMAL LOW (ref 26.0–34.0)
MCHC: 30.5 g/dL (ref 30.0–36.0)
MCV: 84.5 fL (ref 80.0–100.0)
Monocytes Absolute: 0.5 10*3/uL (ref 0.1–1.0)
Monocytes Relative: 5 %
Neutro Abs: 7 10*3/uL (ref 1.7–7.7)
Neutrophils Relative %: 72 %
Platelets: 356 10*3/uL (ref 150–400)
RBC: 3.73 MIL/uL — ABNORMAL LOW (ref 3.87–5.11)
RDW: 19.2 % — ABNORMAL HIGH (ref 11.5–15.5)
WBC: 9.7 10*3/uL (ref 4.0–10.5)
nRBC: 0 % (ref 0.0–0.2)

## 2020-12-21 LAB — GLUCOSE, CAPILLARY
Glucose-Capillary: 124 mg/dL — ABNORMAL HIGH (ref 70–99)
Glucose-Capillary: 140 mg/dL — ABNORMAL HIGH (ref 70–99)
Glucose-Capillary: 140 mg/dL — ABNORMAL HIGH (ref 70–99)
Glucose-Capillary: 148 mg/dL — ABNORMAL HIGH (ref 70–99)
Glucose-Capillary: 162 mg/dL — ABNORMAL HIGH (ref 70–99)
Glucose-Capillary: 167 mg/dL — ABNORMAL HIGH (ref 70–99)

## 2020-12-21 LAB — RENAL FUNCTION PANEL
Albumin: 2.5 g/dL — ABNORMAL LOW (ref 3.5–5.0)
Anion gap: 13 (ref 5–15)
BUN: 22 mg/dL — ABNORMAL HIGH (ref 6–20)
CO2: 25 mmol/L (ref 22–32)
Calcium: 8.8 mg/dL — ABNORMAL LOW (ref 8.9–10.3)
Chloride: 105 mmol/L (ref 98–111)
Creatinine, Ser: 0.63 mg/dL (ref 0.44–1.00)
GFR, Estimated: >60 mL/min (ref >60)
Glucose, Bld: 153 mg/dL — ABNORMAL HIGH (ref 70–99)
Phosphorus: 3.9 mg/dL (ref 2.5–4.6)
Potassium: 3.9 mmol/L (ref 3.5–5.1)
Sodium: 143 mmol/L (ref 135–145)

## 2020-12-21 LAB — MAGNESIUM: Magnesium: 2.4 mg/dL (ref 1.7–2.4)

## 2020-12-21 MED ORDER — GUAIFENESIN 100 MG/5ML PO SOLN
200.0000 mg | ORAL | Status: DC | PRN
  Administered 2020-12-21 – 2021-01-07 (×9): 200 mg
  Filled 2020-12-21 (×12): qty 10

## 2020-12-21 NOTE — Progress Notes (Signed)
[place] for Electrolyte Monitoring and Replacement   Recent Labs: Potassium (mmol/L)  Date Value  12/21/2020 3.9  02/20/2013 3.4 (L)   Magnesium (mg/dL)  Date Value  12/21/2020 2.4   Calcium (mg/dL)  Date Value  12/21/2020 8.8 (L)   Calcium, Total (mg/dL)  Date Value  02/20/2013 8.7   Albumin (g/dL)  Date Value  12/21/2020 2.5 (L)  02/20/2013 3.2 (L)   Phosphorus (mg/dL)  Date Value  12/21/2020 3.9   Sodium (mmol/L)  Date Value  12/21/2020 143  02/20/2013 137   Corrected Ca: 8.3 mg/dL  Assessment: 55 year old female presented to the ED via EMS post witnessed cardiac arrest. ROSC achieved following 3 defibrillations and one round of epinephrine. Patient found to have significant hypokalemia on arrival. Given witnessed arrest, patient Code ICE. Pharmacy to manage electrolytes.   Nutrition: ProSource BID + Vital 40 mL/hr  Goal of Therapy:  Potassium 4.0 - 5.1 mmol/L Magnesium 2.1 - 2.4 mg/dL All Other Electrolytes WNL  Plan:   Na 143, continue free water flushes 200 mL q4h (1.2 L/day)  Potassium and magnesium stable  Follow up with morning labs  Tawnya Crook, PharmD 12/21/2020 7:54 AM

## 2020-12-21 NOTE — Progress Notes (Signed)
CRITICAL CARE NOTE  BACKGROUND:   CC  follow up respiratory failure  [name] with acute cardiac arrest Labs c/w severe acidosis and severe DKA Patient with witnessed V fib arrest and shocked in the field ROSC returned is unknown    12/07/2020 MRI Brain 1. Progressive DWI and FLAIR abnormality within the parietotemporal cortices and the bilateral corpus striatum, consistent with anoxic brain injury. 2. No hemorrhage or mass effect.    1/10 admitted to ICU for cardiac arrest, started hypothermia protocol 1/11 hypothermia protocol +posturing 1/12 signs of brain damage, needs NEURO consultation 1/13 failed SAT/SBT, unable to follow commands 1/14- for SBT today, electrolytes are improved. Encephalopathic working on mental status. 12/07/20-Patient remains crtically ill and is with interval worsening MRI brain, I met with husband to briefly review. Discussed case with Neurologist today and there is plan for meeting with family. 12/14/20- patient for LTAC placement s/p PEG/trache. No events today. 1/23severe brain damage 1/24 severe Brain Damage, severe agitation 1/25 severe brain damage 1/27 bouts of tachypnea  SUBJECTIVE:  Following few commands and nodding appropriately   OBJECTIVE   VITALS:  BP (!) 116/55   Pulse 74   Temp 99.8 F (37.7 C)   Resp 15   Ht 5' 0.98" (1.549 m)   Wt 86.2 kg   SpO2 100%   BMI 35.93 kg/m    I/O last 3 completed shifts: In: 5748.1 [I.V.:504.2; Blood:240; NG/GT:4280; IV Piggyback:723.8] Out: 6283 [Urine:3400; Stool:350] No intake/output data recorded.  SpO2: 100 % FiO2 (%): 30 %  Estimated body mass index is 35.93 kg/m as calculated from the following:   Height as of this encounter: 5' 0.98" (1.549 m).   Weight as of this encounter: 86.2 kg.  PHYSICAL EXAMINATION:  Physical Exam Constitutional:      Appearance: She is ill-appearing.  HENT:     Head: Normocephalic and atraumatic.  Eyes:     Extraocular Movements:  Extraocular movements intact.     Pupils: Pupils are equal, round, and reactive to light.  Cardiovascular:     Rate and Rhythm: Tachycardia present. Occasional extrasystoles are present.    Pulses: Normal pulses.     Heart sounds: Normal heart sounds.  Abdominal:     General: Abdomen is protuberant. Bowel sounds are normal.     Palpations: Abdomen is soft.     Tenderness: There is no abdominal tenderness. There is no guarding or rebound.  Musculoskeletal:     Right lower leg: 1+ Pitting Edema present.     Left lower leg: 1+ Pitting Edema present.  Neurological:     GCS: GCS eye subscore is 4. GCS motor subscore is 6.     Cranial Nerves: Cranial nerves are intact.     CRITICAL CARE CHECKLIST:   MEDICATIONS: I have reviewed all medications and confirmed regimen as documented   Nutrition Status: Nutrition Problem: Inadequate oral intake Etiology: inability to eat Signs/Symptoms: NPO status Interventions: Tube feeding,Prostat,MVI    Indwelling Urinary Catheter continued, requirement due to   Reason to continue Indwelling Urinary Catheter strict Intake/Output monitoring for hemodynamic instability         Ventilator continued, requirement due to severe respiratory failure   Ventilator Sedation RASS 0 to -2     DVT/GI PRX ordered and assessed  TRANSFUSIONS AS NEEDED  MONITOR FSBS  I Assessed the need for Labs  I Assessed the need for Foley  I Assessed the need for Central Venous Line  Family Discussion when available  I Assessed  the need for Mobilization  I made an Assessment of medications to be adjusted accordingly  Safety Risk assessment completed   ASSESSMENT AND PLAN SYNOPSIS  55 yo morbidly obese [name] with acute cardiac arrest with acute and severe hypoxic resp failure with severe brain damage s/p trach and PEG  RESPIRATORY: Severe ACUTE Hypoxic and Hypercapnic Respiratory Failure  -continue Full MV support wean to PS as tolerated  -continue Bronchodilator  Therapy  -Wean Fio2 and PEEP as tolerated  -VAP/VENT bundle implementation  -Morbid obesity. Will certainly impact respiratory mechanics  -PSV ad lib  12/11/20 CXR low lung volumes, cardiomegaly, and cephalization, cannot exclude pna  CARDIAC:  -EF 60-65%, mild LVH, no LAE, no hemodynamic valvular disease  -ICU monitoring  -oxygen as needed  -Lasix as tolerated  -cardiac markers have waxed/waned but have sharply fallen from peak 947  -prn clonidine for HTN with removal of protracted precedex course   GI:  GI PROPHYLAXIS as indicated  RENAL:  Intake/Output Summary (Last 24 hours) at 12/21/2020 1129 Last data filed at 12/21/2020 0600 Gross per 24 hour  Intake 5081.92 ml  Output 2100 ml  Net 2981.92 ml  Net IO Since Admission: 6,539.65 mL [12/21/20 1129] Lab Results  Component Value Date   CREATININE 0.63 12/21/2020   BUN 22 (H) 12/21/2020   NA 143 12/21/2020   K 3.9 12/21/2020   CL 105 12/21/2020   CO2 25 12/21/2020    Renal indices stable  Remains net positive  Diurese as able for even balance, lasix today  HEME:  Lab Results  Component Value Date   WBC 9.7 12/21/2020   HGB 9.6 (L) 12/21/2020   HCT 31.5 (L) 12/21/2020   MCV 84.5 12/21/2020   PLT 356 12/21/2020  . Lab Results  Component Value Date   INR 1.0 12/02/2020   INR 1.0 12/02/2020  No results found for: DDIMER    Transfused one unit PRBC yesterday  for HgB < 7   F/E/N:  -TF's as tolerated, FWF 200cc every 4h, sodium corrected  -Bowel protocol as indicated  -follow labs as needed  -replace electrolytes as needed, hypoK+ today  -nutrition consultation and following  ENDO  - will use ICU hypoglycemic\Hyperglycemia protocol if indicated   NEUROLOGY  Acute toxic metabolic encephalopathy,   Anoxic brain injury  Following commands and nodding  Remove Precedex, Klonipin for anxiety  PRN Dilaudid for intermittent tachypnea     CASE DISCUSSED IN MULTIDISCIPLINARY ROUNDS WITH ICU  TEAM   Critical Care Time devoted to patient care services described in this note is 35 minutes.  Overall, patient is critically ill, prognosis is guarded.   Patient with Multiorgan failure and at high risk for cardiac arrest and death.  Images reviewed directly and interpretation in A&P is my own unless noted Labs reviewed and evaluated as noted in A/P

## 2020-12-22 ENCOUNTER — Inpatient Hospital Stay: Payer: Self-pay

## 2020-12-22 LAB — GLUCOSE, CAPILLARY
Glucose-Capillary: 120 mg/dL — ABNORMAL HIGH (ref 70–99)
Glucose-Capillary: 126 mg/dL — ABNORMAL HIGH (ref 70–99)
Glucose-Capillary: 131 mg/dL — ABNORMAL HIGH (ref 70–99)
Glucose-Capillary: 135 mg/dL — ABNORMAL HIGH (ref 70–99)
Glucose-Capillary: 150 mg/dL — ABNORMAL HIGH (ref 70–99)
Glucose-Capillary: 168 mg/dL — ABNORMAL HIGH (ref 70–99)

## 2020-12-22 LAB — RENAL FUNCTION PANEL
Albumin: 2.6 g/dL — ABNORMAL LOW (ref 3.5–5.0)
Anion gap: 14 (ref 5–15)
BUN: 20 mg/dL (ref 6–20)
CO2: 24 mmol/L (ref 22–32)
Calcium: 8.8 mg/dL — ABNORMAL LOW (ref 8.9–10.3)
Chloride: 103 mmol/L (ref 98–111)
Creatinine, Ser: 0.63 mg/dL (ref 0.44–1.00)
GFR, Estimated: >60 mL/min (ref >60)
Glucose, Bld: 176 mg/dL — ABNORMAL HIGH (ref 70–99)
Phosphorus: 4.1 mg/dL (ref 2.5–4.6)
Potassium: 3.8 mmol/L (ref 3.5–5.1)
Sodium: 141 mmol/L (ref 135–145)

## 2020-12-22 LAB — CBC WITH DIFFERENTIAL/PLATELET
Abs Immature Granulocytes: 0.04 10*3/uL (ref 0.00–0.07)
Basophils Absolute: 0 10*3/uL (ref 0.0–0.1)
Basophils Relative: 0 %
Eosinophils Absolute: 0.3 10*3/uL (ref 0.0–0.5)
Eosinophils Relative: 3 %
HCT: 30.6 % — ABNORMAL LOW (ref 36.0–46.0)
Hemoglobin: 9.2 g/dL — ABNORMAL LOW (ref 12.0–15.0)
Immature Granulocytes: 0 %
Lymphocytes Relative: 14 %
Lymphs Abs: 1.3 10*3/uL (ref 0.7–4.0)
MCH: 25.2 pg — ABNORMAL LOW (ref 26.0–34.0)
MCHC: 30.1 g/dL (ref 30.0–36.0)
MCV: 83.8 fL (ref 80.0–100.0)
Monocytes Absolute: 0.4 10*3/uL (ref 0.1–1.0)
Monocytes Relative: 4 %
Neutro Abs: 7.7 10*3/uL (ref 1.7–7.7)
Neutrophils Relative %: 79 %
Platelets: 373 10*3/uL (ref 150–400)
RBC: 3.65 MIL/uL — ABNORMAL LOW (ref 3.87–5.11)
RDW: 19.3 % — ABNORMAL HIGH (ref 11.5–15.5)
WBC: 9.8 10*3/uL (ref 4.0–10.5)
nRBC: 0 % (ref 0.0–0.2)

## 2020-12-22 LAB — MAGNESIUM: Magnesium: 2 mg/dL (ref 1.7–2.4)

## 2020-12-22 IMAGING — MR MR HEAD W/O CM
14 of 15 series · 39 of 48 positions shown · IV contrast (gadavist)
Comparison: MRI head [DATE]

CLINICAL DATA: Anoxic brain injury following cardiac arrest.

EXAM:
MRI HEAD WITHOUT AND WITH CONTRAST
TECHNIQUE: Multiplanar, multiecho pulse sequences of the brain and surrounding
structures were obtained without and with intravenous contrast.
CONTRAST:  9mL GADAVIST GADOBUTROL 1 MMOL/ML IV SOLN

[Series 5: ax dwi_tracew · axial · 3.0mm · 0.60mm/px · z∈[-49,+102]mm · 5 of 54 slices shown]
[im 1/54]
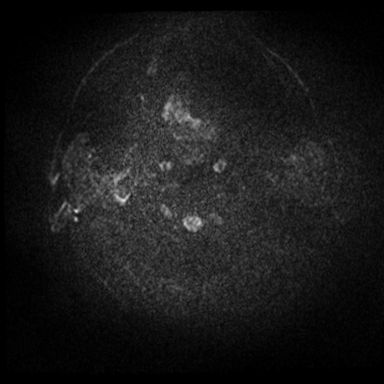
[im 14/54]
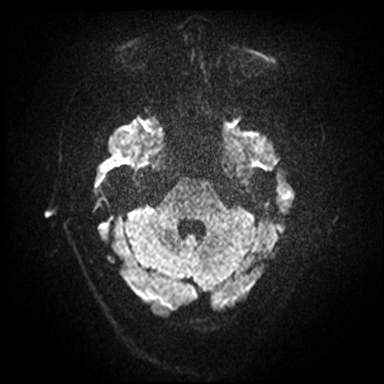
[im 27/54]
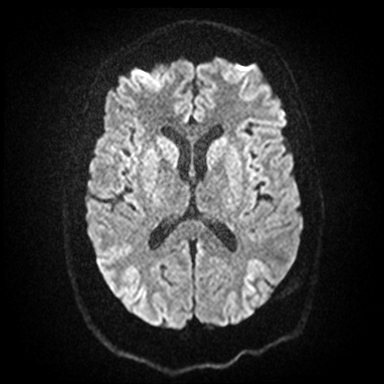
[im 40/54]
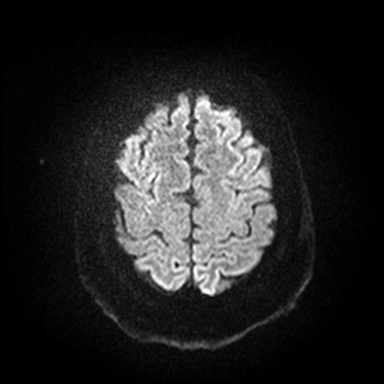
[im 54/54]
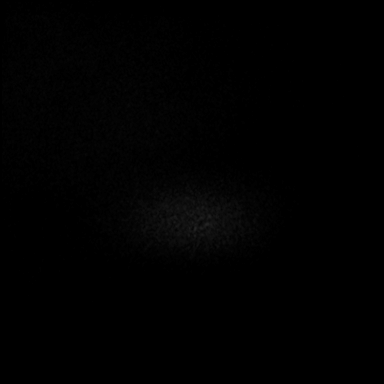

[Series 6: ax dwi_adc · axial · 3.0mm · 0.60mm/px · z∈[-49,+102]mm · 5 of 54 slices shown]
[im 1/54]
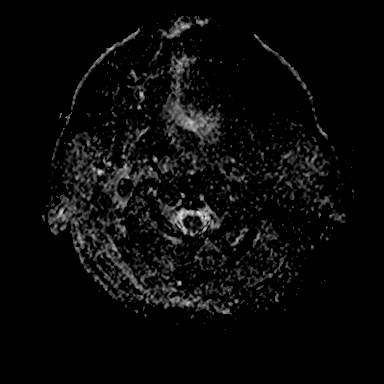
[im 14/54]
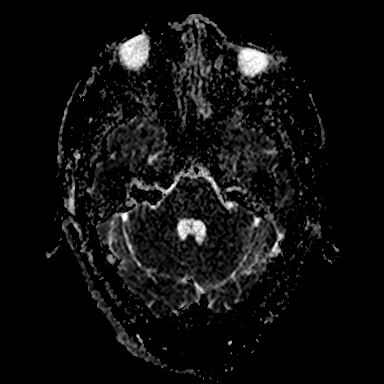
[im 27/54]
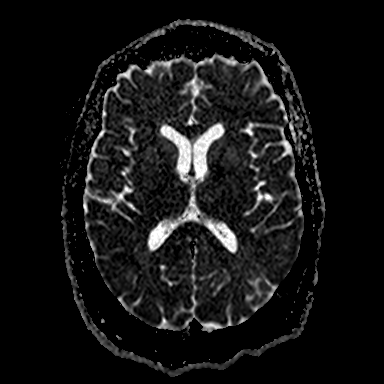
[im 40/54]
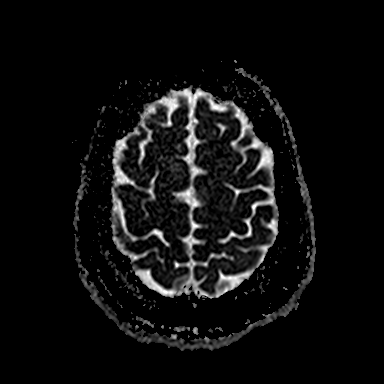
[im 54/54]
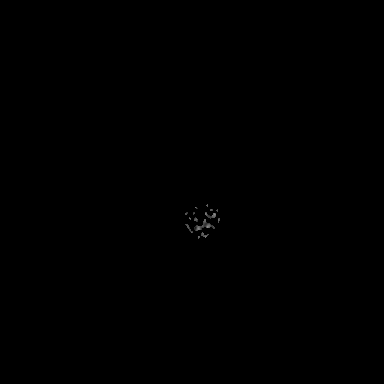

[Series 7: cor dwi_tracew · coronal · 5.0mm · 0.60mm/px · 4 of 40 slices shown]
[im 1/40]
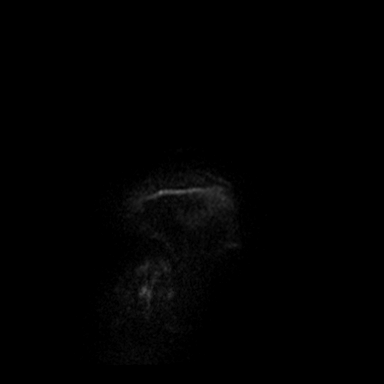
[im 14/40]
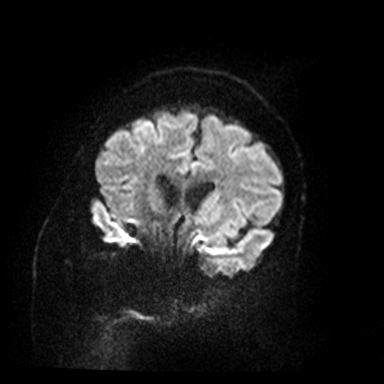
[im 27/40]
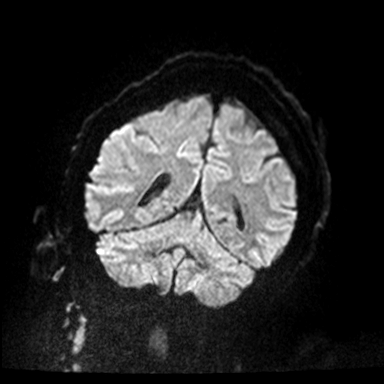
[im 40/40]
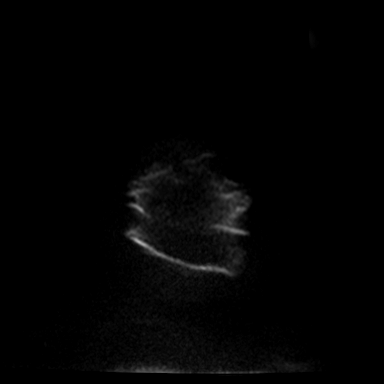

[Series 8: cor dwi_adc · coronal · 5.0mm · 0.60mm/px · 4 of 40 slices shown]
[im 1/40]
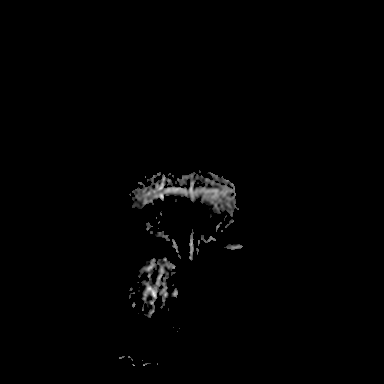
[im 14/40]
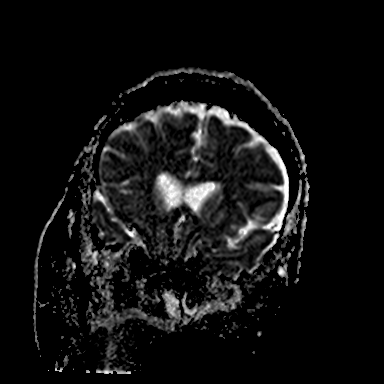
[im 27/40]
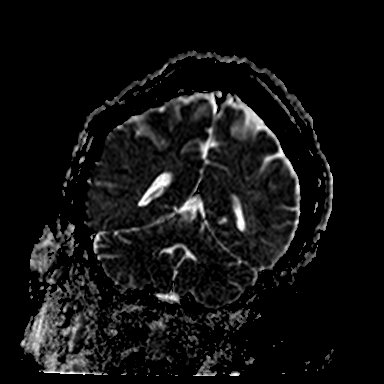
[im 40/40]
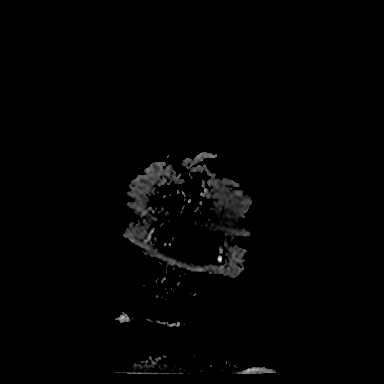

[Series 9: T1 · sagittal · 5.0mm · 0.94mm/px · 2 of 23 slices shown (1 of 2)]
[im 1/23]
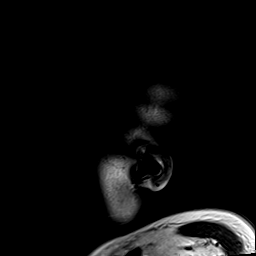
[im 23/23]
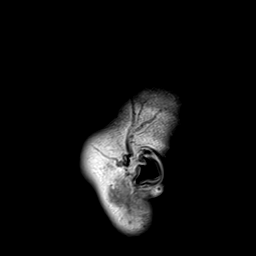

[Series 10: T2 · axial · 5.0mm · 0.69mm/px · z∈[-53,+96]mm · 2 of 27 slices shown (1 of 3)]
[im 1/27]
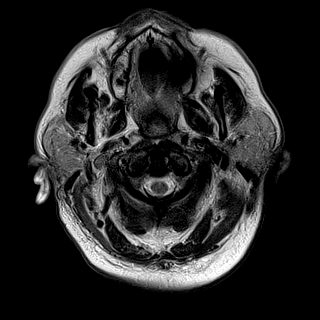
[im 27/27]
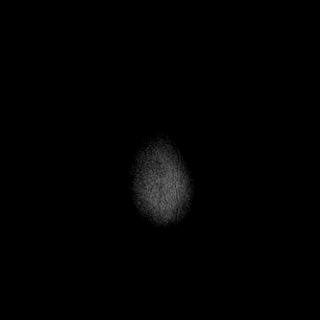

[Series 12: pha_images · axial · 3.0mm · 0.90mm/px · 1 of 50 slices shown]
[im 1/50]
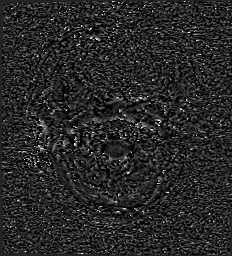

[Series 15: FLAIR · axial · 5.0mm · 0.90mm/px · z∈[-52,+96]mm · 2 of 27 slices shown]
[im 1/27]
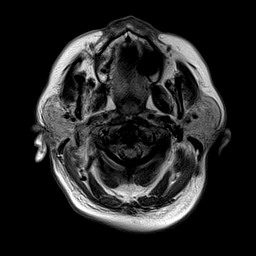
[im 27/27]
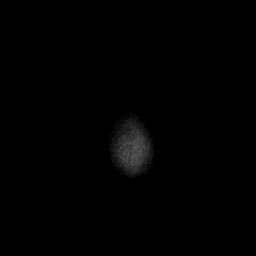

[Series 16: T2 · coronal · 5.0mm · 0.45mm/px · 3 of 31 slices shown (2 of 3)]
[im 1/31]
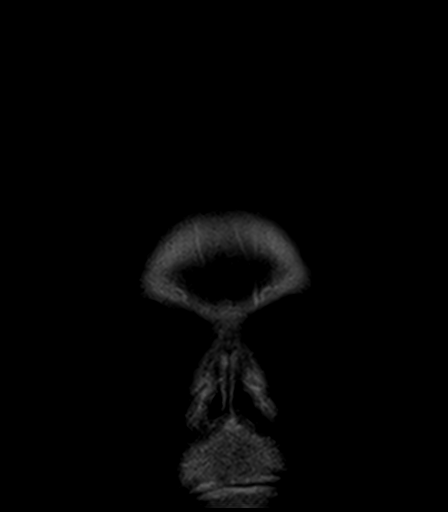
[im 16/31]
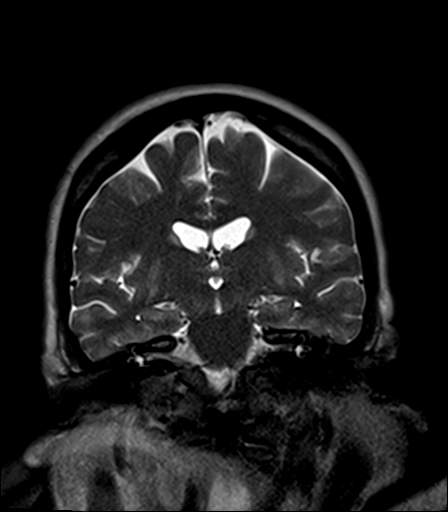
[im 31/31]
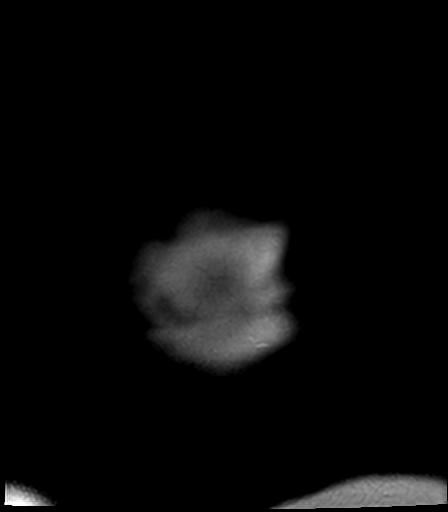

[Series 17: T1 · axial · 5.0mm · 0.90mm/px · z∈[-56,+93]mm · 2 of 27 slices shown (2 of 2)]
[im 1/27]
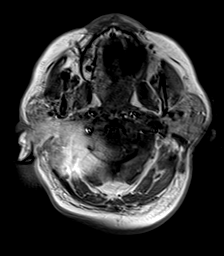
[im 27/27]
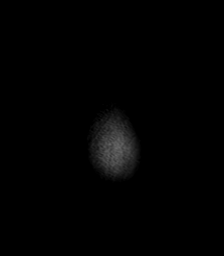

[Series 18: T2 · axial · 5.0mm · 0.45mm/px · z∈[-56,+93]mm · 2 of 27 slices shown (3 of 3)]
[im 1/27]
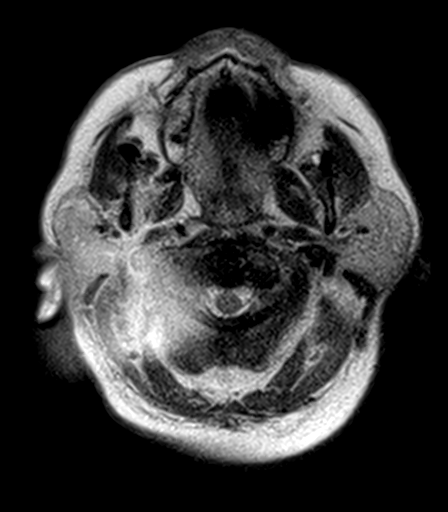
[im 27/27]
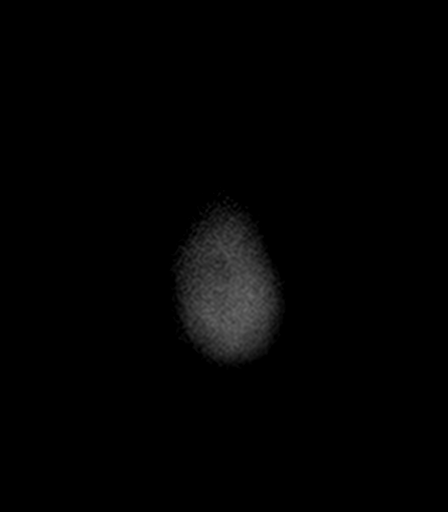

[Series 21: T1 post-contrast · coronal · 5.0mm · 0.90mm/px · 3 of 31 slices shown (1 of 3)]
[im 1/31]
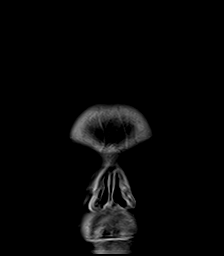
[im 16/31]
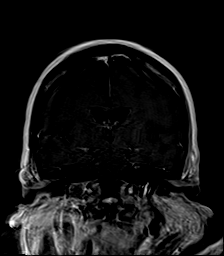
[im 31/31]
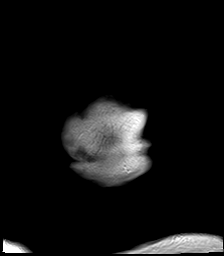

[Series 23: T1 post-contrast · axial · 5.0mm · 0.90mm/px · z∈[-56,+93]mm · 2 of 27 slices shown (2 of 3)]
[im 1/27]
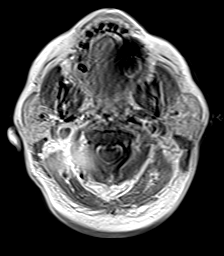
[im 27/27]
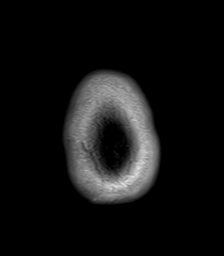

[Series 25: T1 post-contrast · sagittal · 5.0mm · 0.94mm/px · 2 of 23 slices shown (3 of 3)]
[im 1/23]
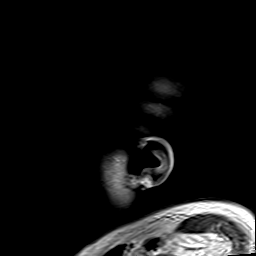
[im 23/23]
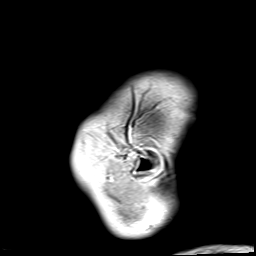

[39 of 48 positions shown; findings below may reference images not displayed]

FINDINGS: Brain: Progressive restricted diffusion in the globus pallidus
bilaterally. Diffusion signal in the caudate and putamen improved.
There is progressive FLAIR edema in the caudate and putamen compared
to the prior study. Previously noted restricted diffusion throughout
the cerebral cortex shows interval improvement however there remains
mild cortical edema in the frontal parietal and temporal lobes
bilaterally.

Ventricle size normal. Negative for hemorrhage or mass. Normal
enhancement following contrast administration.

Vascular: Normal arterial flow voids.

Skull and upper cervical spine: 3 cm sclerotic lesion in the right
anterior parietal bone as noted on CT. No other skull lesion
identified.

Sinuses/Orbits: Mild mucosal edema paranasal sinuses. Bilateral
mastoid effusion. Negative orbit

Other: None
IMPRESSION: Findings consistent with anoxic brain injury. There is evidence of
infarction in the basal ganglia and cortical gray matter
bilaterally. Evolutionary changes likely related to involving
ischemia compared with the prior MRI of [DATE].
Diffusion-weighted signal is less apparent on the current study
compared to the prior study. Correlate with neurologic exam.

Sclerotic lesion right parietal bone, uncertain etiology.

## 2020-12-22 IMAGING — MR MR HEAD WO/W CM
14 of 15 series · 39 of 48 positions shown · IV contrast (gadavist)
Comparison: MRI head [DATE]

CLINICAL DATA: Anoxic brain injury following cardiac arrest.

EXAM:
MRI HEAD WITHOUT AND WITH CONTRAST
TECHNIQUE: Multiplanar, multiecho pulse sequences of the brain and surrounding
structures were obtained without and with intravenous contrast.
CONTRAST:  9mL GADAVIST GADOBUTROL 1 MMOL/ML IV SOLN

[Series 5: ax dwi_tracew · axial · 3.0mm · 0.60mm/px · z∈[-49,+102]mm · 5 of 54 slices shown]
[im 1/54]
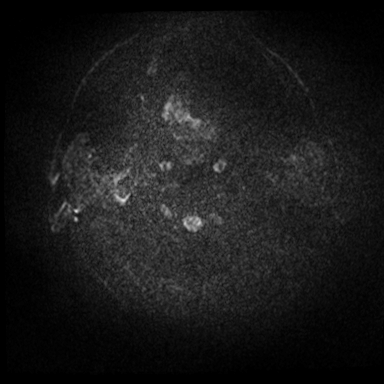
[im 14/54]
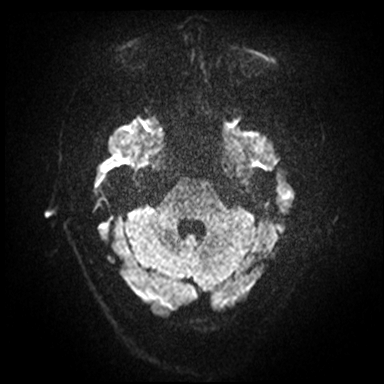
[im 27/54]
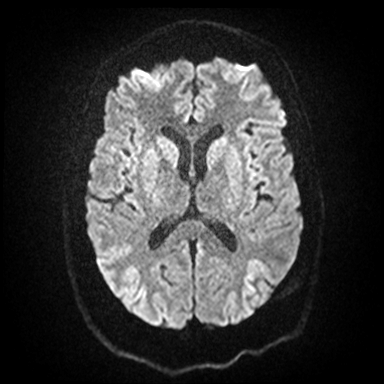
[im 40/54]
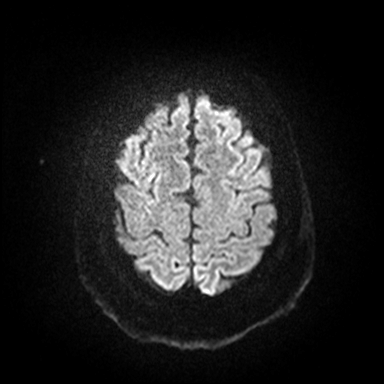
[im 54/54]
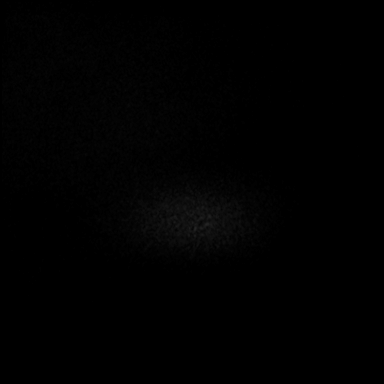

[Series 6: ax dwi_adc · axial · 3.0mm · 0.60mm/px · z∈[-49,+102]mm · 5 of 54 slices shown]
[im 1/54]
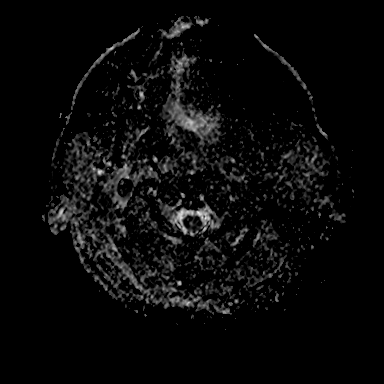
[im 14/54]
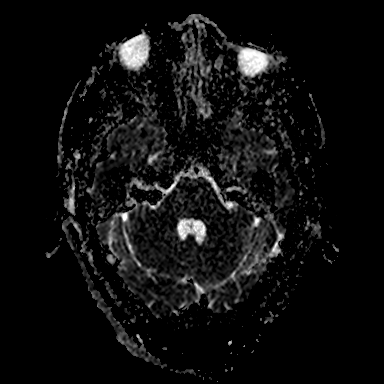
[im 27/54]
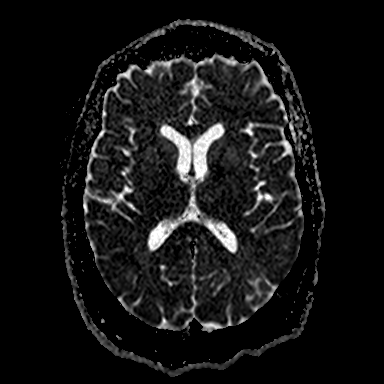
[im 40/54]
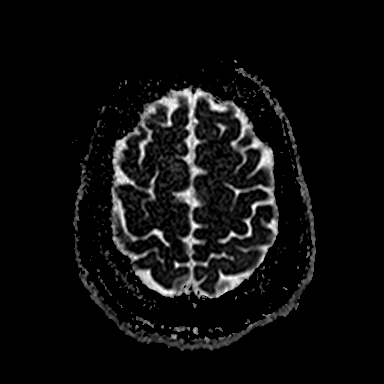
[im 54/54]
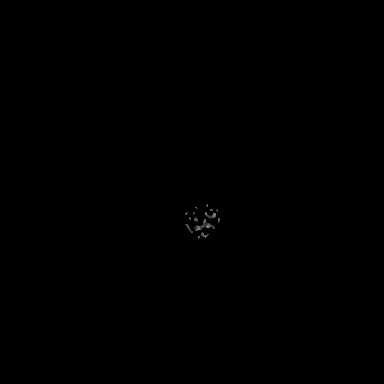

[Series 7: cor dwi_tracew · coronal · 5.0mm · 0.60mm/px · 4 of 40 slices shown]
[im 1/40]
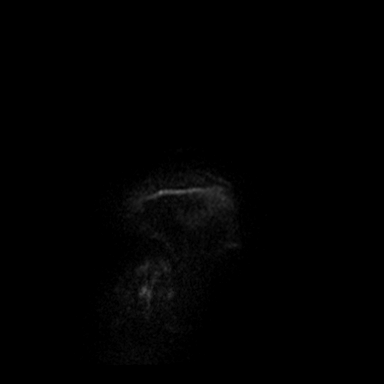
[im 14/40]
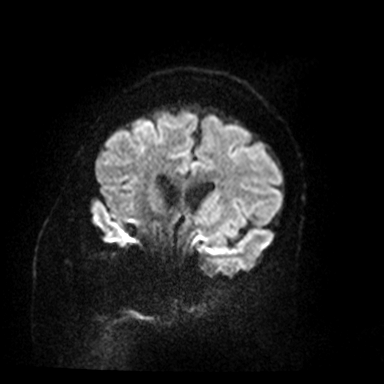
[im 27/40]
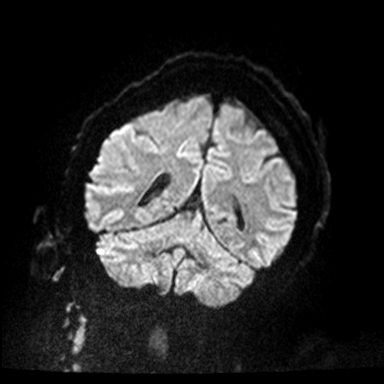
[im 40/40]
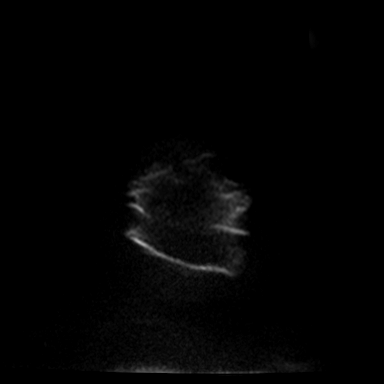

[Series 8: cor dwi_adc · coronal · 5.0mm · 0.60mm/px · 4 of 40 slices shown]
[im 1/40]
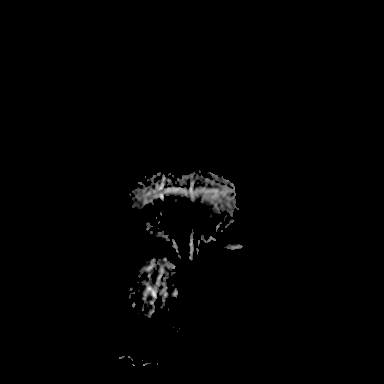
[im 14/40]
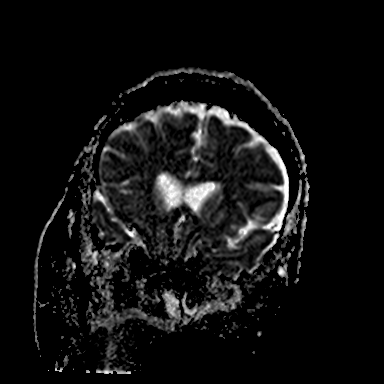
[im 27/40]
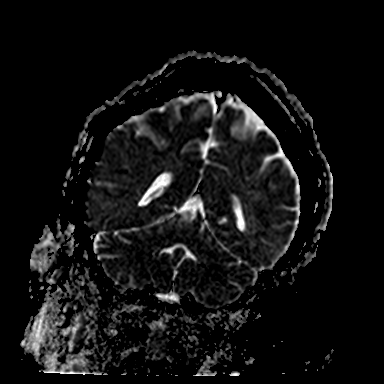
[im 40/40]
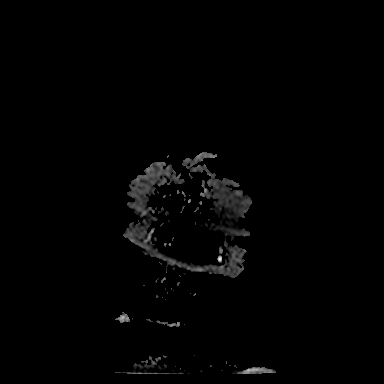

[Series 9: T1 · sagittal · 5.0mm · 0.94mm/px · 2 of 23 slices shown (1 of 2)]
[im 1/23]
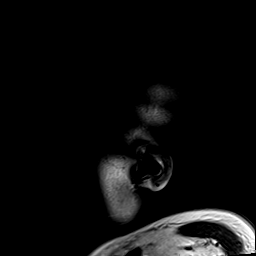
[im 23/23]
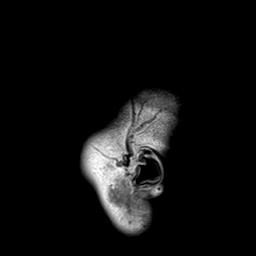

[Series 10: T2 · axial · 5.0mm · 0.69mm/px · z∈[-53,+96]mm · 2 of 27 slices shown (1 of 3)]
[im 1/27]
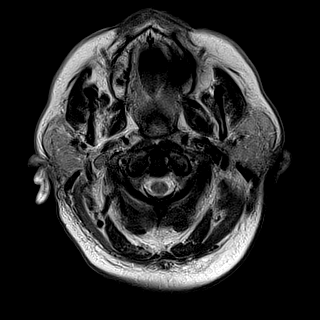
[im 27/27]
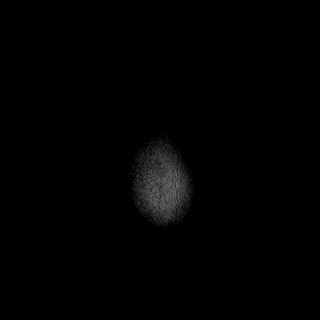

[Series 12: pha_images · axial · 3.0mm · 0.90mm/px · 1 of 50 slices shown]
[im 1/50]
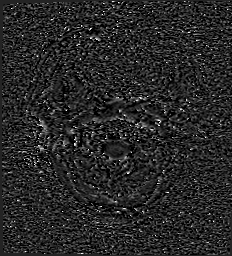

[Series 15: FLAIR · axial · 5.0mm · 0.90mm/px · z∈[-52,+96]mm · 2 of 27 slices shown]
[im 1/27]
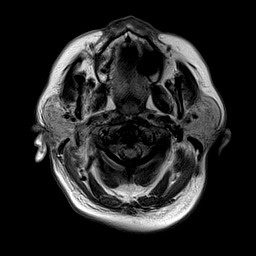
[im 27/27]
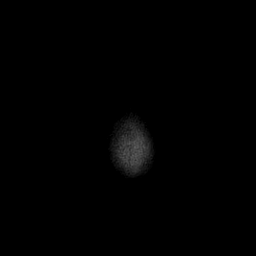

[Series 16: T2 · coronal · 5.0mm · 0.45mm/px · 3 of 31 slices shown (2 of 3)]
[im 1/31]
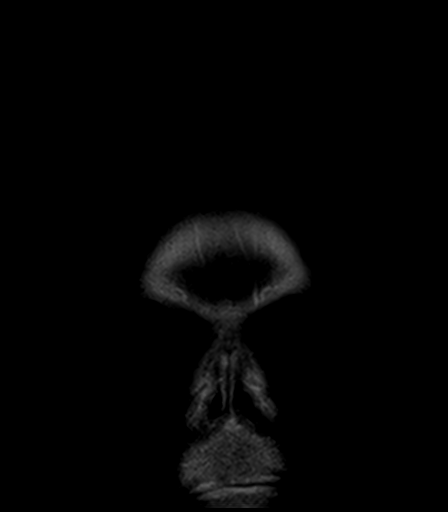
[im 16/31]
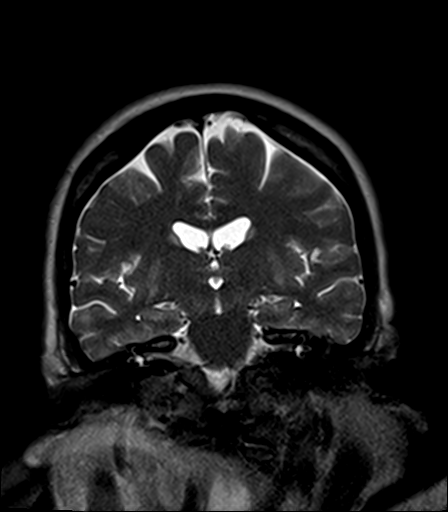
[im 31/31]
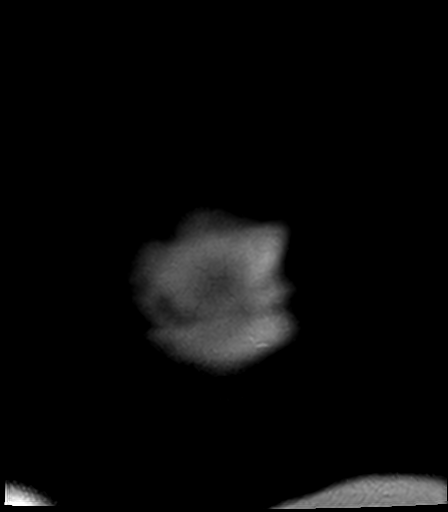

[Series 17: T1 · axial · 5.0mm · 0.90mm/px · z∈[-56,+93]mm · 2 of 27 slices shown (2 of 2)]
[im 1/27]
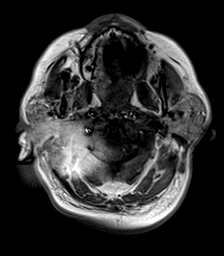
[im 27/27]
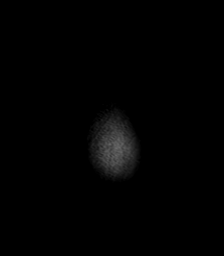

[Series 18: T2 · axial · 5.0mm · 0.45mm/px · z∈[-56,+93]mm · 2 of 27 slices shown (3 of 3)]
[im 1/27]
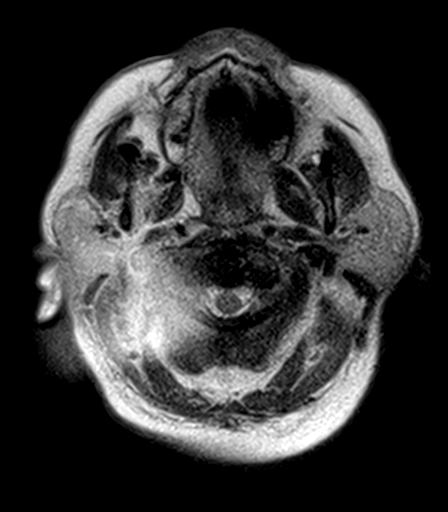
[im 27/27]
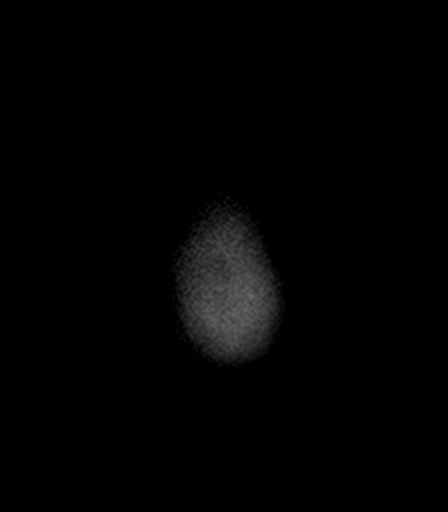

[Series 21: T1 post-contrast · coronal · 5.0mm · 0.90mm/px · 3 of 31 slices shown (1 of 3)]
[im 1/31]
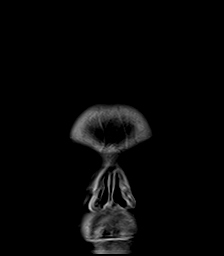
[im 16/31]
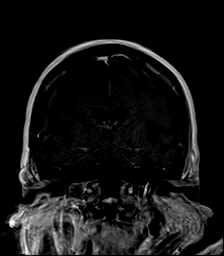
[im 31/31]
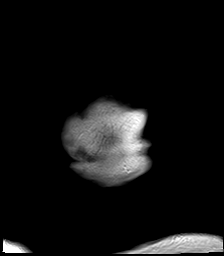

[Series 23: T1 post-contrast · axial · 5.0mm · 0.90mm/px · z∈[-56,+93]mm · 2 of 27 slices shown (2 of 3)]
[im 1/27]
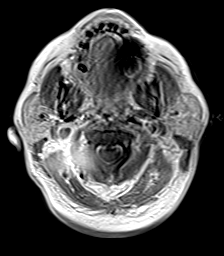
[im 27/27]
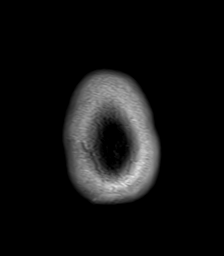

[Series 25: T1 post-contrast · sagittal · 5.0mm · 0.94mm/px · 2 of 23 slices shown (3 of 3)]
[im 1/23]
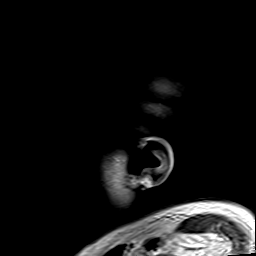
[im 23/23]
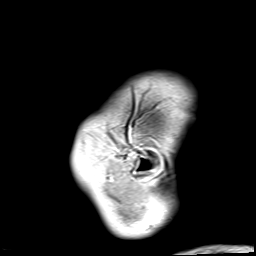

[39 of 48 positions shown; findings below may reference images not displayed]

FINDINGS: Brain: Progressive restricted diffusion in the globus pallidus
bilaterally. Diffusion signal in the caudate and putamen improved.
There is progressive FLAIR edema in the caudate and putamen compared
to the prior study. Previously noted restricted diffusion throughout
the cerebral cortex shows interval improvement however there remains
mild cortical edema in the frontal parietal and temporal lobes
bilaterally.

Ventricle size normal. Negative for hemorrhage or mass. Normal
enhancement following contrast administration.

Vascular: Normal arterial flow voids.

Skull and upper cervical spine: 3 cm sclerotic lesion in the right
anterior parietal bone as noted on CT. No other skull lesion
identified.

Sinuses/Orbits: Mild mucosal edema paranasal sinuses. Bilateral
mastoid effusion. Negative orbit

Other: None
IMPRESSION: Findings consistent with anoxic brain injury. There is evidence of
infarction in the basal ganglia and cortical gray matter
bilaterally. Evolutionary changes likely related to involving
ischemia compared with the prior MRI of [DATE].
Diffusion-weighted signal is less apparent on the current study
compared to the prior study. Correlate with neurologic exam.

Sclerotic lesion right parietal bone, uncertain etiology.

## 2020-12-22 MED ORDER — ALTEPLASE 2 MG IJ SOLR
2.0000 mg | Freq: Once | INTRAMUSCULAR | Status: DC
  Filled 2020-12-22 (×2): qty 2
  Filled 2020-12-22 (×2): qty 4

## 2020-12-22 MED ORDER — LEVETIRACETAM 500 MG PO TABS
500.0000 mg | ORAL_TABLET | Freq: Two times a day (BID) | ORAL | Status: DC
  Administered 2020-12-22 – 2020-12-23 (×3): 500 mg via ORAL
  Filled 2020-12-22 (×4): qty 1

## 2020-12-22 MED ORDER — LEVETIRACETAM IN NACL 1500 MG/100ML IV SOLN
1500.0000 mg | Freq: Once | INTRAVENOUS | Status: AC
  Administered 2020-12-22: 1500 mg via INTRAVENOUS
  Filled 2020-12-22: qty 100

## 2020-12-22 MED ORDER — LORAZEPAM 2 MG/ML IJ SOLN: 2.0000 mg | Freq: Once | INTRAMUSCULAR | Status: AC

## 2020-12-22 MED ORDER — STERILE WATER FOR INJECTION IJ SOLN
INTRAMUSCULAR | Status: AC
  Filled 2020-12-22: qty 20

## 2020-12-22 MED ORDER — LORAZEPAM 2 MG/ML IJ SOLN
INTRAMUSCULAR | Status: AC
  Administered 2020-12-22: 2 mg via INTRAVENOUS
  Filled 2020-12-22: qty 1

## 2020-12-22 MED ORDER — GADOBUTROL 1 MMOL/ML IV SOLN
9.0000 mL | Freq: Once | INTRAVENOUS | Status: AC | PRN
  Administered 2020-12-22: 9 mL via INTRAVENOUS

## 2020-12-22 MED ORDER — ALTEPLASE 2 MG IJ SOLR
2.0000 mg | Freq: Once | INTRAMUSCULAR | Status: AC
  Administered 2020-12-22: 2 mg
  Filled 2020-12-22: qty 2

## 2020-12-22 NOTE — Progress Notes (Addendum)
CRITICAL CARE NOTE  BACKGROUND:   CC  follow up respiratory failure   Background: [name] with acute cardiac arrest Labs c/w severe acidosis and severe DKA Patient with witnessed V fib arrest and shocked in the field ROSC returned is unknown   12/07/2020 MRI Brain 1. Progressive DWI and FLAIR abnormality within the parietotemporal cortices and the bilateral corpus striatum, consistent with anoxic brain injury. 2. No hemorrhage or mass effect.    1/10 admitted to ICU for cardiac arrest, started hypothermia protocol 1/11 hypothermia protocol +posturing 1/12 signs of brain damage, needs NEURO consultation 1/13 failed SAT/SBT, unable to follow commands 1/14- for SBT today, electrolytes are improved. Encephalopathic working on mental status. 12/07/20-Patient remains crtically ill and is with interval worsening MRI brain, I met with husband to briefly review. Discussed case with Neurologist today and there is plan for meeting with family. 12/14/20- patient for LTAC placement s/p PEG/trache. No events today. 1/23severe brain damage 1/24 severe Brain Damage, severe agitation 1/25 severe brain damage 1/27 bouts of ventilator asynchrony 1/30 focal seizure activity around mouth noted started on Keppra neurology reconsulted   SUBJECTIVE:  Following few commands and nodding appropriately, appears weak on right  OBJECTIVE   VITALS:  BP (!) 142/89 (BP Location: Right Arm)   Pulse (!) 111   Temp 99.8 F (37.7 C)   Resp (!) 22   Ht 5' 0.98" (1.549 m)   Wt 86.2 kg   SpO2 100%   BMI 35.93 kg/m    I/O last 3 completed shifts: In: 2025 [I.V.:485; Other:60; NG/GT:1480] Out: 2250 [Urine:2150; Stool:100] No intake/output data recorded.  SpO2: 100 % FiO2 (%): 28 %  Estimated body mass index is 35.93 kg/m as calculated from the following:   Height as of this encounter: 5' 0.98" (1.549 m).   Weight as of this encounter: 86.2 kg.  PHYSICAL EXAMINATION:  General:  Mechanically ventilated via trach, opens eyes, tracks, follows simple commands HENT: [place]/AT, tracheostomy in place, sclera anicteric, PERRL Lungs: Coarse sounds, no wheezes or rhonchi noted Cardiovascular: RRR, s1s2, no murmur Abdomen: soft, non-distended, BS+ Extremities: warm, no edema Neuro: awake, opens eyes to verbal stimuli. Does follow SIMPLE commands.  GU: Pure wick  Scheduled Meds: . sodium chloride   Intravenous Once  . amLODipine  10 mg Per Tube Daily  . chlorhexidine gluconate (MEDLINE KIT)  15 mL Mouth Rinse BID  . Chlorhexidine Gluconate Cloth  6 each Topical Daily  . clonazePAM  1 mg Per Tube BID  . feeding supplement (PROSource TF)  45 mL Per Tube BID  . feeding supplement (VITAL AF 1.2 CAL)  1,000 mL Per Tube Q24H  . fluconazole  100 mg Per Tube Daily  . free water  200 mL Per Tube Q4H  . heparin  5,000 Units Subcutaneous Q8H  . insulin aspart  0-15 Units Subcutaneous Q4H  . levETIRAcetam  500 mg Oral BID  . LORazepam      . LORazepam  2 mg Intravenous Once  . mouth rinse  15 mL Mouth Rinse 10 times per day  . multivitamin with minerals  1 tablet Per Tube Daily  . oxyCODONE  10 mg Per Tube Q6H  . pantoprazole sodium  40 mg Per Tube QHS  . polyethylene glycol  17 g Oral Daily  . senna-docusate  1 tablet Oral Daily  . sodium chloride flush  10-40 mL Intracatheter Q12H   Continuous Infusions: . sodium chloride 250 mL (12/19/20 0449)  . dexmedetomidine (PRECEDEX) IV infusion  0.6 mcg/kg/hr (12/22/20 0940)   PRN Meds:.acetaminophen, cloNIDine, guaiFENesin, hydrALAZINE, HYDROmorphone (DILAUDID) injection, midazolam, morphine injection, sodium chloride flush  Results for orders placed or performed during the hospital encounter of 12/02/20 (from the past 24 hour(s))  Glucose, capillary     Status: Abnormal   Collection Time: 12/21/20  3:32 PM  Result Value Ref Range   Glucose-Capillary 162 (H) 70 - 99 mg/dL  Glucose, capillary     Status: Abnormal   Collection Time:  12/21/20  7:33 PM  Result Value Ref Range   Glucose-Capillary 140 (H) 70 - 99 mg/dL  Glucose, capillary     Status: Abnormal   Collection Time: 12/21/20 11:33 PM  Result Value Ref Range   Glucose-Capillary 140 (H) 70 - 99 mg/dL  Glucose, capillary     Status: Abnormal   Collection Time: 12/22/20  3:29 AM  Result Value Ref Range   Glucose-Capillary 150 (H) 70 - 99 mg/dL  CBC with Differential/Platelet     Status: Abnormal   Collection Time: 12/22/20  6:49 AM  Result Value Ref Range   WBC 9.8 4.0 - 10.5 K/uL   RBC 3.65 (L) 3.87 - 5.11 MIL/uL   Hemoglobin 9.2 (L) 12.0 - 15.0 g/dL   HCT 30.6 (L) 36.0 - 46.0 %   MCV 83.8 80.0 - 100.0 fL   MCH 25.2 (L) 26.0 - 34.0 pg   MCHC 30.1 30.0 - 36.0 g/dL   RDW 19.3 (H) 11.5 - 15.5 %   Platelets 373 150 - 400 K/uL   nRBC 0.0 0.0 - 0.2 %   Neutrophils Relative % 79 %   Neutro Abs 7.7 1.7 - 7.7 K/uL   Lymphocytes Relative 14 %   Lymphs Abs 1.3 0.7 - 4.0 K/uL   Monocytes Relative 4 %   Monocytes Absolute 0.4 0.1 - 1.0 K/uL   Eosinophils Relative 3 %   Eosinophils Absolute 0.3 0.0 - 0.5 K/uL   Basophils Relative 0 %   Basophils Absolute 0.0 0.0 - 0.1 K/uL   Immature Granulocytes 0 %   Abs Immature Granulocytes 0.04 0.00 - 0.07 K/uL  Magnesium     Status: None   Collection Time: 12/22/20  6:49 AM  Result Value Ref Range   Magnesium 2.0 1.7 - 2.4 mg/dL  Renal function panel     Status: Abnormal   Collection Time: 12/22/20  6:49 AM  Result Value Ref Range   Sodium 141 135 - 145 mmol/L   Potassium 3.8 3.5 - 5.1 mmol/L   Chloride 103 98 - 111 mmol/L   CO2 24 22 - 32 mmol/L   Glucose, Bld 176 (H) 70 - 99 mg/dL   BUN 20 6 - 20 mg/dL   Creatinine, Ser 0.63 0.44 - 1.00 mg/dL   Calcium 8.8 (L) 8.9 - 10.3 mg/dL   Phosphorus 4.1 2.5 - 4.6 mg/dL   Albumin 2.6 (L) 3.5 - 5.0 g/dL   GFR, Estimated >60 >60 mL/min   Anion gap 14 5 - 15  Glucose, capillary     Status: Abnormal   Collection Time: 12/22/20  7:27 AM  Result Value Ref Range    Glucose-Capillary 168 (H) 70 - 99 mg/dL  Glucose, capillary     Status: Abnormal   Collection Time: 12/22/20 11:05 AM  Result Value Ref Range   Glucose-Capillary 120 (H) 70 - 99 mg/dL    CRITICAL CARE CHECKLIST:   MEDICATIONS: I have reviewed all medications and confirmed regimen as documented   Nutrition Status: Nutrition  Problem: Inadequate oral intake Etiology: inability to eat Signs/Symptoms: NPO status Interventions: Tube feeding,Prostat,MVI    Indwelling Urinary Catheter  no indwelling catheter External pure wick catheter  Reason to continue Indwelling Urinary Catheter         Ventilator continued, requirement due to severe respiratory failure   Ventilator Sedation RASS 0 to -2     DVT/GI PRX ordered and assessed  TRANSFUSIONS AS NEEDED  MONITOR FSBS  I Assessed the need for Labs  I Assessed the need for Foley  I Assessed the need for Central Venous Line  Family Discussion when available  I Assessed the need for Mobilization  I made an Assessment of medications to be adjusted accordingly  Safety Risk assessment completed   ASSESSMENT AND PLAN SYNOPSIS  55 yo morbidly obese [name] with acute cardiac arrest with acute and severe hypoxic resp failure with severe brain damage s/p trach and PEG  RESPIRATORY: ACUTE Hypoxic Respiratory Failure  -continue Full MV support wean to PS as tolerated  -continue Bronchodilator Therapy  -Wean Fio2 and PEEP as tolerated  -VAP/VENT bundle implementation  -Morbid obesity. Will certainly impact respiratory mechanics  -PSV ad lib  -Will need LTACH  CARDIAC:  -EF 60-65%, mild LVH, no LAE, no hemodynamic valvular disease  -ICU monitoring  -Lasix as tolerated  -cardiac markers have waxed/waned but have sharply fallen from peak 947  -prn clonidine for HTN with removal of protracted precedex course   GI: GI PROPHYLAXIS as indicated  RENAL:  Intake/Output Summary (Last 24 hours) at 12/22/2020 0815 Last data filed at  12/22/2020 1610 Gross per 24 hour  Intake 1874.73 ml  Output 1550 ml  Net 324.73 ml  Net IO Since Admission: 6,864.38 mL [12/22/20 0815] Lab Results  Component Value Date   CREATININE 0.63 12/22/2020   BUN 20 12/22/2020   NA 141 12/22/2020   K 3.8 12/22/2020   CL 103 12/22/2020   CO2 24 12/22/2020    Renal indices stable  Remains net positive  Diurese as able for even balance, lasix today  HEME:  Lab Results  Component Value Date   WBC 9.8 12/22/2020   HGB 9.2 (L) 12/22/2020   HCT 30.6 (L) 12/22/2020   MCV 83.8 12/22/2020   PLT 373 12/22/2020  . Lab Results  Component Value Date   INR 1.0 12/02/2020   INR 1.0 12/02/2020  No results found for: DDIMER    Transfuse as needed for for HgB < 7   F/E/N:  -TF's as tolerated, FWF 200cc every 4h, sodium corrected  -Bowel protocol as indicated  -follow labs as needed  -replace electrolytes as needed, hypoK+ today  -nutrition consultation and following  ENDO  - will use ICU hypoglycemic\Hyperglycemia protocol if indicated   NEUROLOGY  Acute toxic metabolic encephalopathy,   Anoxic brain injury  New focal seizures noted today  Keppra started, MRI brain, EEG  Following commands and nodding  Removed Precedex, Klonipin for anxiety  PRN Dilaudid for intermittent tachypnea    Discussed with Dr. Rory Percy, neurology    CASE DISCUSSED [place] ICU TEAM   Critical Care Time devoted to patient care services described in this note is 40 minutes.  Overall, patient is critically ill, prognosis is guarded.   Patient with mltiorgan failure and at high risk for cardiac arrest and death.  Images reviewed directly and interpretation in A&P is my own unless noted Labs reviewed and evaluated as noted in A/P   C. Derrill Kay, MD [facility]   *  This note was dictated using voice recognition software/Dragon.  Despite best efforts to proofread, errors can occur which can change the meaning.  Any change  was purely unintentional.

## 2020-12-22 NOTE — Progress Notes (Addendum)
NEUROLOGY CONSULTATION PROGRESS NOTE   Date of service: December 22, 2020 Patient Name: [name] [name] MRN:  [id number] DOB:  December 18, 1965  Brief HPI  [name] [name] is a 55 y.o. female with hx of HTN p/w myoclonic jerking and unresponsiveness following cardiac arrest with unknown down time, initial rhythm of Vfibb, prolonged ACLS before ROSC. Initial MRI Brain on 1/12 with subtle DWI changes(no ADC correlate thou) with corresponding T2/FLAIR changes(althou not called out by radiologist), repeat MRI Brain with progressive DWI and FLAIR abnormalities within the parietoltemporal cortices, BL striatum, consistent with anoxic brain injury.   Interval Hx   Neurology was recalled for concern for focal seizures on the face. Keppra has been ordered by the ICU.  1500 mg IV load x1. Patient has started to follow some simple commands and has been awake for the past few days.   Vitals   Vitals:   12/22/20 0145 12/22/20 0400 12/22/20 0500 12/22/20 0600  BP:  (!) 154/85 (!) 166/97 (!) 142/89  Pulse:  (!) 113 (!) 111   Resp:  (!) 21 (!) 25 (!) 22  Temp:  99.8 F (37.7 C)    TempSrc:      SpO2: 98% 94% 100%   Weight:      Height:         Body mass index is 35.93 kg/m.  Physical Exam   General: Trached, in no acute distress. HENT: Normal oropharynx and mucosa, some secretions around ETT. Normal external appearance of ears and nose. Neck: Supple, no pain or tenderness CV: No JVD. No peripheral edema. Pulmonary: Symmetric Chest rise. Normal respiratory effort. Breathing over Vent. Abdomen: Soft to touch, non-tender. Ext: Diffuse trace edema in all 4 extremities Skin: No rash. Normal palpation of skin.  Musculoskeletal: Normal digits and nails by inspection. No clubbing.  Neurologic Examination on Precedex and Fentanyl  Mental status/Cognition: Opens eyes, mouths words appropriately to simple commands.  To the greeting of hello, she responded with hello.  Upon asking her how she is doing-she  said okay.  She was able to close her eyes, stick her tongue out and smile on command. She also moves her extremities to command as below Poor attention concentration Cranial nerves: Pupils are equal round reactive to light, eyes are mildly disconjugate but there is no restriction of extraocular movements, she blinks to threat from both sides, facial sensation is intact, face appears symmetric. Motor examination: Diminished tone in all fours but she is able to move her left upper and lower extremity with much coaching to at least 3/5 at the hip and shoulders and has more weakness distally.  On the right side, she is spontaneously able to move but to command she appears to be weaker than the left. Sensory exam: Intact to touch Coordination cannot be assessed due to the above  Labs   Basic Metabolic Panel:  Lab Results  Component Value Date   NA 141 12/22/2020   K 3.8 12/22/2020   CO2 24 12/22/2020   GLUCOSE 176 (H) 12/22/2020   BUN 20 12/22/2020   CREATININE 0.63 12/22/2020   CALCIUM 8.8 (L) 12/22/2020   GFRNONAA >60 12/22/2020   GFRAA >60 10/24/2017   HbA1c:  Lab Results  Component Value Date   HGBA1C 5.9 (H) 12/02/2020   LDL: No results found for: [facility] Urine Drug Screen:     Component Value Date/Time   LABOPIA NONE DETECTED 12/02/2020 0723   COCAINSCRNUR NONE DETECTED 12/02/2020 [place] DETECTED 12/02/2020 0723  AMPHETMU NONE DETECTED 12/02/2020 [place] DETECTED 12/02/2020 0723   LABBARB NONE DETECTED 12/02/2020 0723    Imaging and Diagnostic studies   MRI Brain without contrast 12/04/20: Personally reviewed and read as normal MRI Brain by radiology. However does seem to have subtle DWI changes(?shine through) with no clear ADC correlate and subtle diffuse T2/FLAIR changes. MRI Brain without contrast 12/07/20: 1. Progressive DWI and FLAIR abnormality within the parietotemporal cortices and the bilateral corpus striatum, consistent with  anoxic brain injury. 2. No hemorrhage or mass effect.  ASSESSMENT   [name] [name] is a 55 y.o. female with hx of HTN p/w myoclonic jerking and unresponsiveness following cardiac arrest with unknown down time, initial rhythm of Vfib, prolonged ACLS before ROSC, now s/p hypothermia protocol. MRI Brain consistent with diffuse hypoxic/anoxic injury.  Last neurological exam on 118 confounded by Precedex and fentanyl-most consistent with unresponsive wakefulness with concern for progression to persistent vegetative state-but the exam is severely limited by the medications. Over the last week, she has been more awake and following simple commands. On my exam she is weak all over with worse weakness on the right than compared to left. On the good side-the left side-she is barely antigravity proximally and also is weak distally. She is able to follow simple commands though very consistently. Neurology was recalled because of concern for focal facial twitching-focal seizures very likely given the extensive hypoxic/anoxic damage already seen on her imaging from before.  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 -For any seizure lasting more than 5 minutes, use Ativan IV or Versed IV.  Try to avoid using Ativan unless the seizure lasts for more than 5 minutes. -Routine EEG in the morning -Do not see a need for for stat EEG or imaging but agree with repeating an MRI brain to evaluate for the residual damage post hypoxic/anoxic injury and to assess for any ischemic changes or bleeds. -Supportive care and medical management for managing toxic metabolic derangements per primary team as you are -Continue to maintain seizure  precautions.  Discussed my plan with Dr. Patsey Berthold on the unit. ______________________________________________________________________   -- Amie Portland, MD Neurologist [facility] Pager: [phone]  CRITICAL CARE ATTESTATION Performed by: Amie Portland, MD Total critical care time: 31 minutes Critical care time was exclusive of separately billable procedures and treating other patients and/or supervising APPs/Residents/Students Critical care was necessary to treat or prevent imminent or life-threatening deterioration due to hypoxic ischemic encephalopathy, seizures This patient is critically ill and at significant risk for neurological worsening and/or death and care requires constant monitoring. Critical care was time spent personally by me on the following activities: development of treatment plan with patient and/or surrogate as well as nursing, discussions with consultants, evaluation of patient's response to treatment, examination of patient, obtaining history from patient or surrogate, ordering and performing treatments and interventions, ordering and review of laboratory studies, ordering and review of radiographic studies, pulse oximetry, re-evaluation of patient's condition, participation in multidisciplinary rounds and medical decision making of high complexity in the care of this patient.

## 2020-12-22 NOTE — Progress Notes (Signed)
MRI reviewed. Expected evolutionary changes from the anoxic injury involving the high metabolism areas - deep and cortical GM. DWI changes have started to normalize and FLAIR changes more evident. Not seeing a significant asymmetry in the findings that can explain worse right sided weakness - but part of exam is marred by generalized weakness and deconditioning.   Radiology MRI Reading IMPRESSION: Findings consistent with anoxic brain injury. 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 Evolutionary changes likely related to involving ischemia compared with the prior MRI of 12/07/2020. Diffusion-weighted signal is less apparent on the current study compared to the prior study. Correlate with neurologic exam. Sclerotic lesion right parietal bone, uncertain etiology.   Recs as in the progress note from earlier today   -- Amie Portland, MD Neurologist [facility] Pager: [phone]

## 2020-12-22 NOTE — Plan of Care (Signed)
Pt had uneventful night, TMax 101.2 given tylenol and temp now 99.8. pt cont on full vent support 30/5/15/500. Noted pt more awake, following command nodding appropriately to question although she has some delay in responses. Grip weak with right hand,purposeful  to all ext expect left hand. Pt is tolerating TF well. Cont on precedex gtt at 0.6 mcq/hr.

## 2020-12-22 NOTE — Progress Notes (Signed)
[place] for Electrolyte Monitoring and Replacement   Recent Labs: Potassium (mmol/L)  Date Value  12/22/2020 3.8  02/20/2013 3.4 (L)   Magnesium (mg/dL)  Date Value  12/22/2020 2.0   Calcium (mg/dL)  Date Value  12/22/2020 8.8 (L)   Calcium, Total (mg/dL)  Date Value  02/20/2013 8.7   Albumin (g/dL)  Date Value  12/22/2020 2.6 (L)  02/20/2013 3.2 (L)   Phosphorus (mg/dL)  Date Value  12/22/2020 4.1   Sodium (mmol/L)  Date Value  12/22/2020 141  02/20/2013 137   Corrected Ca: 8.3 mg/dL  Assessment: 55 year old female presented to the ED via EMS post witnessed cardiac arrest. ROSC achieved following 3 defibrillations and one round of epinephrine. Patient found to have significant hypokalemia on arrival. Given witnessed arrest, patient Code ICE. Pharmacy to manage electrolytes.   Nutrition: ProSource BID + Vital 40 mL/hr  Goal of Therapy:  Potassium 4.0 - 5.1 mmol/L Magnesium 2.1 - 2.4 mg/dL All Other Electrolytes WNL  Plan:   Na 141, continue free water flushes 200 mL q4h (1.2 L/day)  No further electrolyte replacement indicated  Follow up with morning labs  Tawnya Crook, PharmD 12/22/2020 9:57 AM

## 2020-12-23 ENCOUNTER — Inpatient Hospital Stay: Payer: Self-pay

## 2020-12-23 LAB — CBC WITH DIFFERENTIAL/PLATELET
Abs Immature Granulocytes: 0.03 10*3/uL (ref 0.00–0.07)
Basophils Absolute: 0 10*3/uL (ref 0.0–0.1)
Basophils Relative: 0 %
Eosinophils Absolute: 0.2 10*3/uL (ref 0.0–0.5)
Eosinophils Relative: 3 %
HCT: 30.5 % — ABNORMAL LOW (ref 36.0–46.0)
Hemoglobin: 9.2 g/dL — ABNORMAL LOW (ref 12.0–15.0)
Immature Granulocytes: 0 %
Lymphocytes Relative: 16 %
Lymphs Abs: 1.4 10*3/uL (ref 0.7–4.0)
MCH: 25.2 pg — ABNORMAL LOW (ref 26.0–34.0)
MCHC: 30.2 g/dL (ref 30.0–36.0)
MCV: 83.6 fL (ref 80.0–100.0)
Monocytes Absolute: 0.5 10*3/uL (ref 0.1–1.0)
Monocytes Relative: 6 %
Neutro Abs: 6.5 10*3/uL (ref 1.7–7.7)
Neutrophils Relative %: 75 %
Platelets: 346 10*3/uL (ref 150–400)
RBC: 3.65 MIL/uL — ABNORMAL LOW (ref 3.87–5.11)
RDW: 19 % — ABNORMAL HIGH (ref 11.5–15.5)
WBC: 8.7 10*3/uL (ref 4.0–10.5)
nRBC: 0 % (ref 0.0–0.2)

## 2020-12-23 LAB — RENAL FUNCTION PANEL
Albumin: 2.3 g/dL — ABNORMAL LOW (ref 3.5–5.0)
Anion gap: 10 (ref 5–15)
BUN: 24 mg/dL — ABNORMAL HIGH (ref 6–20)
CO2: 25 mmol/L (ref 22–32)
Calcium: 8.5 mg/dL — ABNORMAL LOW (ref 8.9–10.3)
Chloride: 106 mmol/L (ref 98–111)
Creatinine, Ser: 0.52 mg/dL (ref 0.44–1.00)
GFR, Estimated: >60 mL/min (ref >60)
Glucose, Bld: 144 mg/dL — ABNORMAL HIGH (ref 70–99)
Phosphorus: 4 mg/dL (ref 2.5–4.6)
Potassium: 3.6 mmol/L (ref 3.5–5.1)
Sodium: 141 mmol/L (ref 135–145)

## 2020-12-23 LAB — GLUCOSE, CAPILLARY
Glucose-Capillary: 141 mg/dL — ABNORMAL HIGH (ref 70–99)
Glucose-Capillary: 159 mg/dL — ABNORMAL HIGH (ref 70–99)
Glucose-Capillary: 133 mg/dL — ABNORMAL HIGH (ref 70–99)
Glucose-Capillary: 144 mg/dL — ABNORMAL HIGH (ref 70–99)
Glucose-Capillary: 154 mg/dL — ABNORMAL HIGH (ref 70–99)

## 2020-12-23 LAB — MAGNESIUM: Magnesium: 1.9 mg/dL (ref 1.7–2.4)

## 2020-12-23 MED ORDER — LEVETIRACETAM 100 MG/ML PO SOLN
500.0000 mg | Freq: Two times a day (BID) | ORAL | Status: DC
  Administered 2020-12-23 – 2020-12-28 (×10): 500 mg
  Filled 2020-12-23 (×11): qty 5

## 2020-12-23 MED ORDER — POLYETHYLENE GLYCOL 3350 17 G PO PACK
17.0000 g | PACK | Freq: Every day | ORAL | Status: DC
  Administered 2020-12-26 – 2021-01-10 (×12): 17 g
  Filled 2020-12-23 (×13): qty 1

## 2020-12-23 MED ORDER — POTASSIUM CHLORIDE 20 MEQ PO PACK
40.0000 meq | PACK | Freq: Once | ORAL | Status: AC
  Administered 2020-12-23: 40 meq
  Filled 2020-12-23: qty 2

## 2020-12-23 MED ORDER — SENNOSIDES-DOCUSATE SODIUM 8.6-50 MG PO TABS
1.0000 | ORAL_TABLET | Freq: Every day | ORAL | Status: DC
  Administered 2020-12-26 – 2021-01-10 (×13): 1
  Filled 2020-12-23 (×14): qty 1

## 2020-12-23 MED ORDER — ATORVASTATIN CALCIUM 80 MG PO TABS
80.0000 mg | ORAL_TABLET | Freq: Every day | ORAL | Status: DC
  Administered 2020-12-23 – 2021-01-10 (×19): 80 mg
  Filled 2020-12-23: qty 1
  Filled 2020-12-23 (×2): qty 4
  Filled 2020-12-23: qty 1
  Filled 2020-12-23: qty 4
  Filled 2020-12-23: qty 1
  Filled 2020-12-23 (×5): qty 4
  Filled 2020-12-23 (×3): qty 1
  Filled 2020-12-23: qty 4
  Filled 2020-12-23 (×3): qty 1
  Filled 2020-12-23: qty 4
  Filled 2020-12-23: qty 1

## 2020-12-23 NOTE — Progress Notes (Signed)
CRITICAL CARE NOTE [name] with acute cardiac arrest Labs c/w severe acidosis and severe DKA Patient with witnessed V fib arrest and shocked in the field ROSC returned is unknown   1/10 admitted to ICU for cardiac arrest, started hypothermia protocol 1/11 hypothermia protocol +posturing 1/12 signs of brain damage, needs NEURO consultation 1/13 failed SAT/SBT, unable to follow commands 1/14- for SBT today, electrolytes are improved. Encephalopathic working on mental status. 12/07/20-Patient remains crtically ill and is with interval worsening MRI brain, I met with husband to briefly review. Discussed case with Neurologist today and there is plan for meeting with family. 12/14/20- patient for LTAC placement s/p PEG/trache. No events today. 1/23severe brain damage 1/24 severe Brain Damage, severe agitation 1/25 severe brain damage 1/27 bouts of ventilator asynchrony 1/30 focal seizure activity around mouth noted started on Keppra neurology reconsulted  CC  follow up respiratory failure  SUBJECTIVE Patient remains critically ill Prognosis is guarded   BP (!) 154/86   Pulse 97   Temp 99.8 F (37.7 C) (Oral)   Resp (!) 28   Ht 5' 0.98" (1.549 m)   Wt 87.9 kg   SpO2 99%   BMI 36.63 kg/m    I/O last 3 completed shifts: In: 6365.3 [I.V.:665.3; Other:60; NG/GT:5640] Out: 2080 [Urine:1980; Stool:100] No intake/output data recorded.  SpO2: 99 % FiO2 (%): 28 %  Estimated body mass index is 36.63 kg/m as calculated from the following:   Height as of this encounter: 5' 0.98" (1.549 m).   Weight as of this encounter: 87.9 kg.  SIGNIFICANT EVENTS   REVIEW OF SYSTEMS  PATIENT IS UNABLE TO PROVIDE COMPLETE REVIEW OF SYSTEMS DUE TO SEVERE CRITICAL ILLNESS        PHYSICAL EXAMINATION:  GENERAL:critically ill appearing, +resp distress NECK: Supple.  S/p trach PULMONARY: +rhonchi, +wheezing CARDIOVASCULAR: S1 and S2. Regular rate and rhythm. No murmurs, rubs, or  gallops.  GASTROINTESTINAL: Soft, nontender, -distended.  Positive bowel sounds.  S/p PEG  MUSCULOSKELETAL: No swelling, clubbing, or edema.  NEUROLOGIC: lethargic  SKIN:intact,warm,dry  MEDICATIONS: I have reviewed all medications and confirmed regimen as documented   CULTURE RESULTS   No results found for this or any previous visit (from the past 240 hour(s)).        IMAGING    MR BRAIN WO CONTRAST  Result Date: 12/22/2020 CLINICAL DATA:  Anoxic brain injury following cardiac arrest. EXAM: MRI HEAD WITHOUT AND WITH CONTRAST TECHNIQUE: Multiplanar, multiecho pulse sequences of the brain and surrounding structures were obtained without and with intravenous contrast. CONTRAST:  86m GADAVIST GADOBUTROL 1 MMOL/ML IV SOLN COMPARISON:  MRI head 12/07/2020 FINDINGS: Brain: Progressive restricted diffusion in the globus pallidus bilaterally. Diffusion signal in the caudate and putamen improved. There is progressive FLAIR edema in the caudate and putamen compared to the prior study. Previously noted restricted diffusion throughout the cerebral cortex shows interval improvement however there remains mild cortical edema in the frontal parietal and temporal lobes bilaterally. Ventricle size normal. Negative for hemorrhage or mass. Normal enhancement following contrast administration. Vascular: Normal arterial flow voids. Skull and upper cervical spine: 3 cm sclerotic lesion in the right anterior parietal bone as noted on CT. No other skull lesion identified. Sinuses/Orbits: Mild mucosal edema paranasal sinuses. Bilateral mastoid effusion. Negative orbit Other: None IMPRESSION: Findings consistent with anoxic brain injury. 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 Evolutionary changes likely related to involving ischemia compared with the prior MRI of 12/07/2020. Diffusion-weighted signal  is less apparent on the current study compared to the prior study.  Correlate with neurologic exam. Sclerotic lesion right parietal bone, uncertain etiology. Electronically Signed   By: Franchot Gallo M.D.   On: 12/22/2020 12:46     Nutrition Status: Nutrition Problem: Inadequate oral intake Etiology: inability to eat Signs/Symptoms: NPO status Interventions: Tube feeding,Prostat,MVI     Indwelling Urinary Catheter continued, requirement due to   Reason to continue Indwelling Urinary Catheter strict Intake/Output monitoring for hemodynamic instability   Central Line/ continued, requirement due to  Reason to continue [place] of central venous pressure or other hemodynamic parameters and poor IV access   Ventilator continued, requirement due to severe respiratory failure   Ventilator Sedation RASS 0 to -2      ASSESSMENT AND PLAN SYNOPSIS 54 y.o. obese [name] admiktted for acute cardiac arrest due to Severe acidosis with DKA with hx of HTN p/w myoclonic jerking and unresponsiveness following cardiac arrest with unknown down time, initial rhythm of Vfib, prolonged ACLS before ROSC, now s/p hypothermia protocol. MRI Brain consistent with diffuse hypoxic/anoxic injury-severe brain damage s/p trach and PEG  Severe ACUTE Hypoxic and Hypercapnic Respiratory Failure -continue Full MV support -continue Bronchodilator Therapy -Wean Fio2 and PEEP as tolerated -VAP/VENT bundle implementation   Morbid obesity, possible OSA.   Will certainly impact respiratory mechanics S/p trach     NEUROLOGY Acute toxic metabolic encephalopathy need for sedation Goal RASS -2 to -3  +brain damage Follow up [place] ICU monitoring  ID -continue IV abx as prescibed -follow up cultures  GI GI PROPHYLAXIS as indicated  NUTRITIONAL STATUS Nutrition Status: Nutrition Problem: Inadequate oral intake Etiology: inability to eat Signs/Symptoms: NPO status Interventions: Tube feeding,Prostat,MVI   DIET-->TF's as  tolerated Constipation protocol as indicated  ENDO - will use ICU hypoglycemic\Hyperglycemia protocol if indicated     ELECTROLYTES -follow labs as needed -replace as needed -pharmacy consultation and following   DVT/GI PRX ordered and assessed TRANSFUSIONS AS NEEDED MONITOR FSBS I Assessed the need for Labs I Assessed the need for Foley I Assessed the need for Central Venous Line Family Discussion when available I Assessed the need for Mobilization I made an Assessment of medications to be adjusted accordingly Safety Risk assessment completed   CASE DISCUSSED IN MULTIDISCIPLINARY ROUNDS WITH ICU TEAM  Critical Care Time devoted to patient care services described in this note is 54 minutes.   Overall, patient is critically ill, prognosis is guarded.  Patient with Multiorgan failure and at high risk for cardiac arrest and death.    Corrin Parker, M.D.  [facility]  Medical Director [place] Director [facility] Cardio-Pulmonary Department

## 2020-12-23 NOTE — Progress Notes (Signed)
Subjective: Patient is awake, interactive.   Exam: Vitals:   12/23/20 0600 12/23/20 0800  BP: (!) 154/86   Pulse: 97   Resp: (!) 28   Temp:    SpO2: 97% 99%   Gen: In bed, NAD Resp: non-labored breathing, no acute distress Abd: soft, nt  Neuro: MS: awake, follows commands IA:[id number] vision bilaterally, pupils equal round reactive Motor: She has bilateral weakness, elbow extension seems to be better preserved in flexion in the upper extremities, in the lower extremities, she actually has good plantar flexion, unable to lift right leg against gravity, and the left knee, she is able to extend if the knee is held by the examiner. Sensory: She endorses sensation bilaterally  Pertinent results: MRI shows expected evolution of her previously seen anoxic injury.  Impression: 55 yo  Fs/p cardiac arrest on 1/10 s/p hypothermia. She has significant changes on MRI after cardiac arrest but now following commands. She had an episode of facial twitching and keppra has been started.  Currently, her exam does not seem markedly asymmetric to me, she has significant basal ganglia involvement on the MRI and therefore her weakness could be centrally driven, but some component of ICU myopathy/neuropathy is difficult to exclude.  In any case, treatment is supportive with physical therapy and Occupational Therapy, if she continues to have problems over the long-term an EMG could be considered, but would not change current management.  Her facial twitching could be myoclonic versus partial seizure, either way Keppra could be helpful and therefore I agree with continuing this.  An EEG may be helpful for guiding long-term therapy but I would continue the Keppra no matter the results.  Recommendations: 1) EEG 2) continue Keppra 500 twice daily 3) continue supportive care.  Roland Rack, MD [facility] [phone]  If 7pm- 7am, please page neurology on call as listed in [place].

## 2020-12-23 NOTE — Procedures (Signed)
Patient Name: [name] [name]  MRN: [id number]  Epilepsy Attending: Lora Havens  Referring Physician/Provider: Dr Roland Rack Date: 12/23/2020 Duration: 26.33 mins  Patient history: 55 year old female status post cardiac arrest.  EEG to evaluate for seizures  Level of alertness: awake, asleep  AEDs during EEG study: Clonazepam, LEV  Technical aspects: This EEG study was done with scalp electrodes positioned according to the 10-20 International system of electrode placement. Electrical activity was acquired at a sampling rate of 500Hz  and reviewed with a high frequency filter of 70Hz  and a low frequency filter of 1Hz . EEG data were recorded continuously and digitally stored.   Description: No posterior dominant rhythm was seen. Sleep was characterized by vertex waves, sleep spindles (12-14hz ), maximal frontocentral region. EEG showed continuous generalized 3 to 6 Hz theta-delta slowing admixed with 15-18hz  generalized beta activity. Sharp transients were seen in bilateral frontocentral region only during sleep and are most likely atypical vertex waves.  Hyperventilation and photic stimulation were not performed.     IMPRESSION: This study is suggestive of moderate diffuse encephalopathy, nonspecific etiology. Sharp transients were seen in bilateral frontocentral region only during sleep and are most likely atypical vertex waves. No seizures were seen throughout the recording.  If concern for ictal- interictal activity persists, please consider repeat study.    [name]

## 2020-12-23 NOTE — Progress Notes (Signed)
Nutrition Follow-up  DOCUMENTATION CODES:   Obesity unspecified  INTERVENTION:  Continue Vital AF 1.2 Cal at 40 mL/hr (960 mL goal daily volume) + PROSource TF 45 mL BID per tube. Provides 1232 kcal, 94 grams of protein, 778 mL H2O daily.   Continue MVI daily per tube.  NUTRITION DIAGNOSIS:   Inadequate oral intake related to inability to eat as evidenced by NPO status.  Ongoing.  GOAL:   Patient will meet greater than or equal to 90% of their needs  Met with TF regimen.  MONITOR:   Vent status,Labs,Weight trends,TF tolerance,I & O's  REASON FOR ASSESSMENT:   Ventilator,Consult Enteral/tube feeding initiation and management  ASSESSMENT:   55 year old female with PMHx of HTN admitted after acute cardiac arrest also with DKA and severe acidosis.  1/10 intubated 1/21 s/p tracheostomy tube placement 1/21 s/p G-tube placement  Patient is currently intubated on ventilator support MV: 13.6 L/min Temp (24hrs), Avg:99.2 F (37.3 C), Min:98.2 F (36.8 C), Max:99.9 F (37.7 C)  Medications reviewed and include: Diflucan, free water 200 mL Q4hrs, Novolog 0-15 units Q4hrs, Keppra, MVI daily, Miralax, senna-docusate 1 tablet daily, Precedex gtt.  Labs reviewed: CBG 133-159, BUN 24.  I/O: 1180 mL UOP yesterday (0.6 mL/kg/hr)  Weight trend: 87.9 kg on 1/31; +1.7 kg from 1/10  Enteral Access: 20 Fr. Endovive Safety G-tube  TF regimen: Vital AF 1.2 Cal at 40 mL/hr + PROSource TF 45 mL BID  Discussed on rounds. Patient opening eyes and following commands. Plan for vent weaning this week.  Diet Order:   Diet Order    None     EDUCATION NEEDS:   No education needs have been identified at this time  Skin:  Skin Assessment: Reviewed RN Assessment  Last BM:  12/22/2020 - medium type 7  Height:   Ht Readings from Last 1 Encounters:  12/17/20 5' 0.98" (1.549 m)   Weight:   Wt Readings from Last 1 Encounters:  12/23/20 87.9 kg   Ideal Body Weight:  47.7  kg  BMI:  Body mass index is 36.63 kg/m.  Estimated Nutritional Needs:   Kcal:  [phone] (11-14 kcal/kg)  Protein:  95 grams (2 grams/kg IBW)  Fluid:  >/= 2 L/day  Jacklynn Barnacle, MS, RD, LDN Pager number available on Amion

## 2020-12-23 NOTE — Progress Notes (Signed)
eeg done °

## 2020-12-23 NOTE — Progress Notes (Signed)
[place] for Electrolyte Monitoring and Replacement   Recent Labs: Potassium (mmol/L)  Date Value  12/23/2020 3.6  02/20/2013 3.4 (L)   Magnesium (mg/dL)  Date Value  12/23/2020 1.9   Calcium (mg/dL)  Date Value  12/23/2020 8.5 (L)   Calcium, Total (mg/dL)  Date Value  02/20/2013 8.7   Albumin (g/dL)  Date Value  12/23/2020 2.3 (L)  02/20/2013 3.2 (L)   Phosphorus (mg/dL)  Date Value  12/23/2020 4.0   Sodium (mmol/L)  Date Value  12/23/2020 141  02/20/2013 137   Corrected Ca: 8.3 mg/dL  Assessment: 55 year old female presented to the ED via EMS post witnessed cardiac arrest. ROSC achieved following 3 defibrillations and one round of epinephrine. Patient found to have significant hypokalemia on arrival. Given witnessed arrest, patient Code ICE. Pharmacy to manage electrolytes.   Nutrition: ProSource BID + Vital 40 mL/hr  Goal of Therapy:  Potassium 4.0 - 5.1 mmol/L Magnesium 2.1 - 2.4 mg/dL All Other Electrolytes WNL  Plan:   Na 141, continue free water flushes 200 mL q4h (1.2 L/day)  K 3.6, KCl 40 mEq x 1  No further electrolyte replacement indicated  Follow up with morning labs  [name] [name] 12/23/2020 8:22 AM

## 2020-12-24 LAB — CBC WITH DIFFERENTIAL/PLATELET
Abs Immature Granulocytes: 0.02 10*3/uL (ref 0.00–0.07)
Basophils Absolute: 0 10*3/uL (ref 0.0–0.1)
Basophils Relative: 1 %
Eosinophils Absolute: 0.2 10*3/uL (ref 0.0–0.5)
Eosinophils Relative: 3 %
HCT: 26.6 % — ABNORMAL LOW (ref 36.0–46.0)
Hemoglobin: 7.9 g/dL — ABNORMAL LOW (ref 12.0–15.0)
Immature Granulocytes: 0 %
Lymphocytes Relative: 19 %
Lymphs Abs: 1.3 10*3/uL (ref 0.7–4.0)
MCH: 25.2 pg — ABNORMAL LOW (ref 26.0–34.0)
MCHC: 29.7 g/dL — ABNORMAL LOW (ref 30.0–36.0)
MCV: 84.7 fL (ref 80.0–100.0)
Monocytes Absolute: 0.5 10*3/uL (ref 0.1–1.0)
Monocytes Relative: 7 %
Neutro Abs: 4.8 10*3/uL (ref 1.7–7.7)
Neutrophils Relative %: 70 %
Platelets: 352 10*3/uL (ref 150–400)
RBC: 3.14 MIL/uL — ABNORMAL LOW (ref 3.87–5.11)
RDW: 19 % — ABNORMAL HIGH (ref 11.5–15.5)
WBC: 6.9 10*3/uL (ref 4.0–10.5)
nRBC: 0 % (ref 0.0–0.2)

## 2020-12-24 LAB — GLUCOSE, CAPILLARY
Glucose-Capillary: 115 mg/dL — ABNORMAL HIGH (ref 70–99)
Glucose-Capillary: 129 mg/dL — ABNORMAL HIGH (ref 70–99)
Glucose-Capillary: 133 mg/dL — ABNORMAL HIGH (ref 70–99)
Glucose-Capillary: 134 mg/dL — ABNORMAL HIGH (ref 70–99)
Glucose-Capillary: 145 mg/dL — ABNORMAL HIGH (ref 70–99)
Glucose-Capillary: 148 mg/dL — ABNORMAL HIGH (ref 70–99)
Glucose-Capillary: 155 mg/dL — ABNORMAL HIGH (ref 70–99)

## 2020-12-24 LAB — RENAL FUNCTION PANEL
Albumin: 2.3 g/dL — ABNORMAL LOW (ref 3.5–5.0)
Anion gap: 14 (ref 5–15)
BUN: 26 mg/dL — ABNORMAL HIGH (ref 6–20)
CO2: 25 mmol/L (ref 22–32)
Calcium: 8.8 mg/dL — ABNORMAL LOW (ref 8.9–10.3)
Chloride: 104 mmol/L (ref 98–111)
Creatinine, Ser: 0.76 mg/dL (ref 0.44–1.00)
GFR, Estimated: >60 mL/min (ref >60)
Glucose, Bld: 139 mg/dL — ABNORMAL HIGH (ref 70–99)
Phosphorus: 4.5 mg/dL (ref 2.5–4.6)
Potassium: 4.2 mmol/L (ref 3.5–5.1)
Sodium: 143 mmol/L (ref 135–145)

## 2020-12-24 LAB — MAGNESIUM: Magnesium: 1.9 mg/dL (ref 1.7–2.4)

## 2020-12-24 MED ORDER — LABETALOL HCL 5 MG/ML IV SOLN
10.0000 mg | Freq: Once | INTRAVENOUS | Status: AC
  Administered 2020-12-24: 10 mg via INTRAVENOUS
  Filled 2020-12-24: qty 4

## 2020-12-24 NOTE — Plan of Care (Signed)
Placed on aerosol trach collar with 28% Fio2. Tolerating fairly well

## 2020-12-24 NOTE — Progress Notes (Signed)
CRITICAL CARE NOTE [name] with acute cardiac arrest Labs c/w severe acidosis and severe DKA Patient with witnessed V fib arrest and shocked in the field ROSC returned is unknown   1/10 admitted to ICU for cardiac arrest, started hypothermia protocol 1/11 hypothermia protocol +posturing 1/12 signs of brain damage, needs NEURO consultation 1/13 failed SAT/SBT, unable to follow commands 1/14- for SBT today, electrolytes are improved. Encephalopathic working on mental status. 12/07/20-Patient remains crtically ill and is with interval worsening MRI brain, I met with husband to briefly review. Discussed case with Neurologist today and there is plan for meeting with family. 12/14/20- patient for LTAC placement s/p PEG/trache. No events today. 1/23severe brain damage 1/24 severe Brain Damage, severe agitation 1/25 severe brain damage 1/27 bouts ofventilator asynchrony 1/30 focal seizure activity around mouth noted started on [place] neurology re consulted 1/31 severe agitation     CC  follow up respiratory failure  SUBJECTIVE Patient remains critically ill Prognosis is guarded S/p trach and PEG tube   BP 101/79   Pulse 71   Temp 98.2 F (36.8 C)   Resp 17   Ht 5' 0.98" (1.549 m)   Wt 87.7 kg   SpO2 100%   BMI 36.55 kg/m    I/O last 3 completed shifts: In: 5656.4 [I.V.:336.4; NG/GT:5320] Out: 2530 [Urine:2530] No intake/output data recorded.  SpO2: 100 % FiO2 (%): 28 %  Estimated body mass index is 36.55 kg/m as calculated from the following:   Height as of this encounter: 5' 0.98" (1.549 m).   Weight as of this encounter: 87.7 kg.  SIGNIFICANT EVENTS Very lethargic and agitated Simple commands       PHYSICAL EXAMINATION:  GENERAL:critically ill appearing, +resp distress HEAD: Normocephalic, atraumatic.  EYES: Pupils equal, round, reactive to light.  No scleral icterus.  MOUTH: Moist mucosal membrane. NECK: Supple.  PULMONARY: +rhonchi,  +wheezing CARDIOVASCULAR: S1 and S2. Regular rate and rhythm. No murmurs, rubs, or gallops.  GASTROINTESTINAL: Soft, nontender, -distended.  Positive bowel sounds.   MUSCULOSKELETAL: No swelling, clubbing, or edema.  NEUROLOGIC: obtunded, GCS<8 SKIN:intact,warm,dry  MEDICATIONS: I have reviewed all medications and confirmed regimen as documented    EEG  Result Date: 12/23/2020 Lora Havens, MD     12/23/2020  2:42 PM Patient Name: [name] [name] MRN: [id number] Epilepsy Attending: Lora Havens Referring Physician/Provider: Dr Roland Rack Date: 12/23/2020 Duration: 26.33 mins  Patient history: 55 year old female status post cardiac arrest.  EEG to evaluate for seizures  Level of alertness: awake, asleep  AEDs during EEG study: Clonazepam, LEV  Technical aspects: This EEG study was done with scalp electrodes positioned according to the 10-20 International system of electrode placement. Electrical activity was acquired at a sampling rate of 500Hz  and reviewed with a high frequency filter of 70Hz  and a low frequency filter of 1Hz . EEG data were recorded continuously and digitally stored.  Description: No posterior dominant rhythm was seen. Sleep was characterized by vertex waves, sleep spindles (12-14hz ), maximal frontocentral region. EEG showed continuous generalized 3 to 6 Hz theta-delta slowing admixed with 15-18hz  generalized beta activity. Sharp transients were seen in bilateral frontocentral region only during sleep and are most likely atypical vertex waves.  Hyperventilation and photic stimulation were not performed.   IMPRESSION: This study is suggestive of moderate diffuse encephalopathy, nonspecific etiology. Sharp transients were seen in bilateral frontocentral region only during sleep and are most likely atypical vertex waves. No seizures were seen throughout the recording. If concern for ictal- interictal  activity persists, please consider repeat study.  Lora Havens    MR BRAIN W WO CONTRAST  Result Date: 12/23/2020 CLINICAL DATA:  Anoxic brain injury following cardiac arrest. EXAM: MRI HEAD WITHOUT AND WITH CONTRAST TECHNIQUE: Multiplanar, multiecho pulse sequences of the brain and surrounding structures were obtained without and with intravenous contrast. CONTRAST:  69m GADAVIST GADOBUTROL 1 MMOL/ML IV SOLN COMPARISON:  MRI head 12/07/2020 FINDINGS: Brain: Progressive restricted diffusion in the globus pallidus bilaterally. Diffusion signal in the caudate and putamen improved. There is progressive FLAIR edema in the caudate and putamen compared to the prior study. Previously noted restricted diffusion throughout the cerebral cortex shows interval improvement however there remains mild cortical edema in the frontal parietal and temporal lobes bilaterally. Ventricle size normal. Negative for hemorrhage or mass. Normal enhancement following contrast administration. Vascular: Normal arterial flow voids. Skull and upper cervical spine: 3 cm sclerotic lesion in the right anterior parietal bone as noted on CT. No other skull lesion identified. Sinuses/Orbits: Mild mucosal edema paranasal sinuses. Bilateral mastoid effusion. Negative orbit Other: None IMPRESSION: Findings consistent with anoxic brain injury. 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 Evolutionary changes likely related to involving ischemia compared with the prior MRI of 12/07/2020. Diffusion-weighted signal is less apparent on the current study compared to the prior study. Correlate with neurologic exam. Sclerotic lesion right parietal bone, uncertain etiology. Electronically Signed   By: CFranchot GalloM.D.   On: 12/22/2020 12:46     Nutrition Status: Nutrition Problem: Inadequate oral intake Etiology: inability to eat Signs/Symptoms: NPO status Interventions: Tube feeding,Prostat,MVI     Indwelling Urinary Catheter continued, requirement due to    Reason to continue Indwelling Urinary Catheter strict Intake/Output monitoring for hemodynamic instability   Central Line/ continued, requirement due to  [place] pressure or other hemodynamic parameters and poor IV access   Ventilator continued, requirement due to severe respiratory failure   Ventilator Sedation RASS 0 to -2      ASSESSMENT AND PLAN SYNOPSIS  55y.o.obese [name] admiktted for acute cardiac arrest due to Severe acidosis with DKA with hx of HTN p/w myoclonic jerking and unresponsiveness following cardiac arrest with unknown down time, initial rhythm of Vfib, prolonged ACLS before ROSC, now s/p hypothermia protocol. MRI Brain consistent with diffuse hypoxic/anoxic injury-severe brain damage s/p trach and PEG  Severe ACUTE Hypoxic and Hypercapnic Respiratory Failure -continue Full MV support -continue Bronchodilator Therapy -Wean Fio2 and PEEP as tolerated -will perform SAT/SBT when respiratory parameters are met -VAP/VENT bundle implementation  ACUTE SYSTOLIC CARDIAC FAILURE- EF -oxygen as needed -Lasix as tolerated -follow up cardiac enzymes as indicated -follow up cardiology recs   Morbid obesity, possible OSA.   Will certainly impact respiratory mechanics,  S/p trach    NEUROLOGY Acute toxic metabolic encephalopathy Brain Injury Follow up neuro recs  CARDIAC ICU monitoring  ID -continue IV abx as prescibed -follow up cultures  GI GI PROPHYLAXIS as indicated   DIET-->TF's as tolerated Constipation protocol as indicated  ENDO - will use ICU hypoglycemic\Hyperglycemia protocol if indicated     ELECTROLYTES -follow labs as needed -replace as needed -pharmacy consultation and following   DVT/GI PRX ordered and assessed TRANSFUSIONS AS NEEDED MONITOR FSBS I Assessed the need for Labs I Assessed the need for Foley I Assessed the need for Central Venous Line Family Discussion when  available I Assessed the need for Mobilization I made an Assessment  of medications to be adjusted accordingly Safety Risk assessment completed   CASE DISCUSSED IN MULTIDISCIPLINARY ROUNDS WITH ICU TEAM  Critical Care Time devoted to patient care services described in this note is 45 minutes.   Overall, patient is critically ill, prognosis is guarded.    Corrin Parker, M.D.  [facility]  Medical Director [place] Director [facility] Cardio-Pulmonary Department

## 2020-12-24 NOTE — Progress Notes (Signed)
[place] for Electrolyte Monitoring and Replacement   Recent Labs: Potassium (mmol/L)  Date Value  12/24/2020 4.2  02/20/2013 3.4 (L)   Magnesium (mg/dL)  Date Value  12/24/2020 1.9   Calcium (mg/dL)  Date Value  12/24/2020 8.8 (L)   Calcium, Total (mg/dL)  Date Value  02/20/2013 8.7   Albumin (g/dL)  Date Value  12/24/2020 2.3 (L)  02/20/2013 3.2 (L)   Phosphorus (mg/dL)  Date Value  12/24/2020 4.5   Sodium (mmol/L)  Date Value  12/24/2020 143  02/20/2013 137   Corrected Ca: 8.3 mg/dL  Assessment: 55 year old female presented to the ED via EMS post witnessed cardiac arrest. ROSC achieved following 3 defibrillations and one round of epinephrine. Patient found to have significant hypokalemia on arrival. Given witnessed arrest, patient Code ICE. Pharmacy to manage electrolytes.   Nutrition: ProSource BID + Vital 40 mL/hr  Goal of Therapy:  Potassium 4.0 - 5.1 mmol/L Magnesium 2.1 - 2.4 mg/dL All Other Electrolytes WNL  Plan:   Na 141, continue free water flushes 200 mL q4h (1.2 L/day)  No further electrolyte replacement indicated  Follow up with morning labs  Tawnya Crook, PharmD 12/24/2020 11:30 AM

## 2020-12-24 NOTE — Progress Notes (Signed)
Subjective: Patient is awake, interactive.   Exam: Vitals:   12/24/20 0600 12/24/20 0745  BP: 101/79   Pulse: 71   Resp: 17   Temp:    SpO2: 100% 100%   Gen: In bed, NAD Resp: non-labored breathing, no acute distress Abd: soft, nt  Neuro: MS: awake, follows commands [id number] vision bilaterally, pupils equal round reactive Motor: She has bilateral weakness, elbow extension seems to be better preserved in flexion in the upper extremities, in the lower extremities, she actually has good plantar flexion, unable to lift right leg against gravity, and the left knee, she is able to extend if the knee is held by the examiner. Sensory: She endorses sensation bilaterally  Pertinent results: MRI shows expected evolution of her previously seen anoxic injury.  Impression: 55 yo  Fs/p cardiac arrest on 1/10 s/p hypothermia. She has significant changes on MRI after cardiac arrest but now following commands. She had an episode of facial twitching and keppra has been started.  Currently, her exam does not seem markedly asymmetric to me, she has significant basal ganglia involvement on the MRI and therefore her weakness could be centrally driven, but some component of ICU myopathy/neuropathy is difficult to exclude.  In any case, treatment is supportive with physical therapy and Occupational Therapy, if she continues to have problems over the long-term an EMG could be considered, but would not change current management.  Her facial twitching could be myoclonic versus partial seizure, either way Keppra could be helpful and therefore I agree with continuing this.      Recommendations: 1) continue keppra 500mg  BID.  2) Other care will be supportive, with improvement being gradual over the long term. Neurology will be available as needed.   Roland Rack, MD [facility] [phone]  If 7pm- 7am, please page neurology on call as listed in [place].

## 2020-12-25 LAB — CBC WITH DIFFERENTIAL/PLATELET
Abs Immature Granulocytes: 0.03 10*3/uL (ref 0.00–0.07)
Basophils Absolute: 0 10*3/uL (ref 0.0–0.1)
Basophils Relative: 1 %
Eosinophils Absolute: 0.2 10*3/uL (ref 0.0–0.5)
Eosinophils Relative: 2 %
HCT: 30.5 % — ABNORMAL LOW (ref 36.0–46.0)
Hemoglobin: 9.5 g/dL — ABNORMAL LOW (ref 12.0–15.0)
Immature Granulocytes: 0 %
Lymphocytes Relative: 14 %
Lymphs Abs: 1.2 10*3/uL (ref 0.7–4.0)
MCH: 26.1 pg (ref 26.0–34.0)
MCHC: 31.1 g/dL (ref 30.0–36.0)
MCV: 83.8 fL (ref 80.0–100.0)
Monocytes Absolute: 0.5 10*3/uL (ref 0.1–1.0)
Monocytes Relative: 6 %
Neutro Abs: 6.4 10*3/uL (ref 1.7–7.7)
Neutrophils Relative %: 77 %
Platelets: 376 10*3/uL (ref 150–400)
RBC: 3.64 MIL/uL — ABNORMAL LOW (ref 3.87–5.11)
RDW: 19.5 % — ABNORMAL HIGH (ref 11.5–15.5)
WBC: 8.3 10*3/uL (ref 4.0–10.5)
nRBC: 0 % (ref 0.0–0.2)

## 2020-12-25 LAB — GLUCOSE, CAPILLARY
Glucose-Capillary: 163 mg/dL — ABNORMAL HIGH (ref 70–99)
Glucose-Capillary: 130 mg/dL — ABNORMAL HIGH (ref 70–99)
Glucose-Capillary: 159 mg/dL — ABNORMAL HIGH (ref 70–99)
Glucose-Capillary: 143 mg/dL — ABNORMAL HIGH (ref 70–99)
Glucose-Capillary: 142 mg/dL — ABNORMAL HIGH (ref 70–99)
Glucose-Capillary: 171 mg/dL — ABNORMAL HIGH (ref 70–99)

## 2020-12-25 LAB — RENAL FUNCTION PANEL
Albumin: 2.6 g/dL — ABNORMAL LOW (ref 3.5–5.0)
Anion gap: 12 (ref 5–15)
BUN: 22 mg/dL — ABNORMAL HIGH (ref 6–20)
CO2: 26 mmol/L (ref 22–32)
Calcium: 8.9 mg/dL (ref 8.9–10.3)
Chloride: 105 mmol/L (ref 98–111)
Creatinine, Ser: 0.54 mg/dL (ref 0.44–1.00)
GFR, Estimated: >60 mL/min (ref >60)
Glucose, Bld: 164 mg/dL — ABNORMAL HIGH (ref 70–99)
Phosphorus: 3.3 mg/dL (ref 2.5–4.6)
Potassium: 3.6 mmol/L (ref 3.5–5.1)
Sodium: 143 mmol/L (ref 135–145)

## 2020-12-25 LAB — MAGNESIUM: Magnesium: 1.9 mg/dL (ref 1.7–2.4)

## 2020-12-25 MED ORDER — MAGNESIUM SULFATE 2 GM/50ML IV SOLN
2.0000 g | Freq: Once | INTRAVENOUS | Status: AC
  Administered 2020-12-25: 2 g via INTRAVENOUS
  Filled 2020-12-25: qty 50

## 2020-12-25 MED ORDER — DEXMEDETOMIDINE HCL IN NACL 400 MCG/100ML IV SOLN
0.4000 ug/kg/h | INTRAVENOUS | Status: DC
  Administered 2020-12-25 (×2): 0.8 ug/kg/h via INTRAVENOUS
  Filled 2020-12-25 (×2): qty 100

## 2020-12-25 MED ORDER — POTASSIUM CHLORIDE 20 MEQ PO PACK
40.0000 meq | PACK | Freq: Once | ORAL | Status: AC
  Administered 2020-12-25: 40 meq
  Filled 2020-12-25: qty 2

## 2020-12-25 NOTE — TOC Progression Note (Signed)
Transition of Care [facility]) - Progression Note    Patient Details  Name: [name] [name] MRN: [id number] Date of Birth: 1966/07/31  Transition of Care [facility]) CM/SW [place], [place] Phone Number: [phone] 12/25/2020, 12:26 PM  Clinical Narrative:     CSW spoke with patient's Mow,Darrell (Spouse) [phone] about private pay costs for DME and home health.  CSW gave Mr. Olveda estimated costs from [employer] for DME and [place] for PT, OT, and [place] stated I would give the [employer] and [facility] reps Mr. Durall contact information so they could speak with him about getting all of this set up. Mr. Virgo verbalized understanding.   Expected Discharge Plan: Long Term Nursing Home Barriers to Discharge: Inadequate or no insurance,Continued Medical Work up,Financial Resources  Expected Discharge Plan and Services Expected Discharge Plan: Long Term Nursing Home In-house Referral: Clinical Social Work   Post Acute Care Choice: Nursing Home Living arrangements for the past 2 months: Single Family Home                                       Social Determinants of Health (SDOH) Interventions    Readmission Risk Interventions No flowsheet data found.

## 2020-12-25 NOTE — Progress Notes (Signed)
[place] for Electrolyte Monitoring and Replacement   Recent Labs: Potassium (mmol/L)  Date Value  12/25/2020 3.6  02/20/2013 3.4 (L)   Magnesium (mg/dL)  Date Value  12/25/2020 1.9   Calcium (mg/dL)  Date Value  12/25/2020 8.9   Calcium, Total (mg/dL)  Date Value  02/20/2013 8.7   Albumin (g/dL)  Date Value  12/25/2020 2.6 (L)  02/20/2013 3.2 (L)   Phosphorus (mg/dL)  Date Value  12/25/2020 3.3   Sodium (mmol/L)  Date Value  12/25/2020 143  02/20/2013 137   Corrected Ca: 8.3 mg/dL  Assessment: 55 year old female presented to the ED via EMS post witnessed cardiac arrest. ROSC achieved following 3 defibrillations and one round of epinephrine. Patient found to have significant hypokalemia on arrival. Given witnessed arrest, patient Code ICE. Pharmacy to manage electrolytes.   Nutrition: ProSource BID + Vital 40 mL/hr  Goal of Therapy:  Potassium 4.0 - 5.1 mmol/L Magnesium 2.1 - 2.4 mg/dL All Other Electrolytes WNL  Plan:   Na 143, continue free water flushes 200 mL q4h (1.2 L/day)  Potassium 40 mEq per tube + mag 2 g IV  Follow up with morning labs  Tawnya Crook, PharmD 12/25/2020 9:33 AM

## 2020-12-25 NOTE — Progress Notes (Signed)
CRITICAL CARE NOTE [name] with acute cardiac arrest Labs c/w severe acidosis and severe DKA Patient with witnessed V fib arrest and shocked in the field ROSC returned is unknown   1/10 admitted to ICU for cardiac arrest, started hypothermia protocol 1/11 hypothermia protocol +posturing 1/12 signs of brain damage, needs NEURO consultation 1/13 failed SAT/SBT, unable to follow commands 1/14- for SBT today, electrolytes are improved. Encephalopathic working on mental status. 12/07/20-Patient remains crtically ill and is with interval worsening MRI brain, I met with husband to briefly review. Discussed case with Neurologist today and there is plan for meeting with family. 12/14/20- patient for LTAC placement s/p PEG/trache. No events today. 1/23severe brain damage 1/24 severe Brain Damage, severe agitation 1/25 severe brain damage 1/27 bouts ofventilator asynchrony 1/30 focal seizure activity around mouth noted started on [place] neurology re consulted 1/31 severe agitation 2/2 severe agitation, remains on vent, TCT for 1 hr yesterday  CC  follow up respiratory failure  SUBJECTIVE Patient remains  ill Prognosis is guarded Became very agitated with 1 hr of TCT yesterday, placed back on vent  BP 133/74   Pulse (!) 105   Temp 99.3 F (37.4 C) (Oral)   Resp (!) 23   Ht 5' 0.98" (1.549 m)   Wt 83 kg   SpO2 97%   BMI 34.59 kg/m    I/O last 3 completed shifts: In: 1908.7 [I.V.:311.4; NG/GT:1597.3] Out: 2600 [Urine:2600] No intake/output data recorded.  SpO2: 97 % O2 Flow Rate (L/min): 10 L/min FiO2 (%): 28 %  Estimated body mass index is 34.59 kg/m as calculated from the following:   Height as of this encounter: 5' 0.98" (1.549 m).   Weight as of this encounter: 83 kg.  SIGNIFICANT EVENTS   REVIEW OF SYSTEMS  PATIENT IS UNABLE TO PROVIDE COMPLETE REVIEW OF SYSTEMS DUE TO SEVERE brain injury       PHYSICAL EXAMINATION:  GENERAL ill appearing, +resp  distress EYES: Pupils equal, round, reactive to light.  No scleral icterus.  MOUTH: Moist mucosal membrane. NECK: Supple.  PULMONARY: +rhonchi, +wheezing CARDIOVASCULAR: S1 and S2. Regular rate and rhythm. No murmurs, rubs, or gallops.  GASTROINTESTINAL: Soft, nontender, -distended.  Positive bowel sounds.   MUSCULOSKELETAL: No swelling, clubbing, or edema.  NEUROLOGIC: obtunded, GCS<8 SKIN:intact,warm,dry  MEDICATIONS: I have reviewed all medications and confirmed regimen as documented   CULTURE RESULTS   No results found for this or any previous visit (from the past 240 hour(s)).        IMAGING    No results found.   Nutrition Status: Nutrition Problem: Inadequate oral intake Etiology: inability to eat Signs/Symptoms: NPO status Interventions: Tube feeding,Prostat,MVI     Indwelling Urinary Catheter continued, requirement due to   Reason to continue Indwelling Urinary Catheter strict Intake/Output monitoring for hemodynamic instability   Central Line/ continued, requirement due to  Reason to continue [place] of central venous pressure or other hemodynamic parameters and poor IV access   Ventilator continued, requirement due to severe respiratory failure   Ventilator Sedation RASS 0 to -2      ASSESSMENT AND PLAN SYNOPSIS 55 y.o.obese [name] admiktted for acute cardiac arrest due to Severe acidosis with DKAwith hx of HTN p/w myoclonic jerking and unresponsiveness following cardiac arrest with unknown down time, initial rhythm of Vfib, prolonged ACLS before ROSC, now s/p hypothermia protocol. MRI Brain consistent with diffuse hypoxic/anoxic injury-severe brain damage s/p trach and PEG  Severe ACUTE Hypoxic and Hypercapnic Respiratory Failure -continue Full  MV support -continue Bronchodilator Therapy -Wean Fio2 and PEEP as tolerated -will perform SAT/SBT when respiratory parameters are met -VAP/VENT bundle implementation  ACUTE SYSTOLIC CARDIAC  FAILURE- EF -oxygen as needed -Lasix as tolerated   Morbid obesity, possible OSA.   Will certainly impact respiratory mechanics S/p trach     NEUROLOGY Acute toxic metabolic encephalopathy Brain Injury Follow up neuro recs    CARDIAC ICU monitoring    GI GI PROPHYLAXIS as indicated   DIET-->TF's as tolerated Constipation protocol as indicated  ENDO - will use ICU hypoglycemic\Hyperglycemia protocol if indicated     ELECTROLYTES -follow labs as needed -replace as needed -pharmacy consultation and following   DVT/GI PRX ordered and assessed TRANSFUSIONS AS NEEDED MONITOR FSBS I Assessed the need for Labs I Assessed the need for Foley I Assessed the need for Central Venous Line Family Discussion when available I Assessed the need for Mobilization I made an Assessment of medications to be adjusted accordingly Safety Risk assessment completed   CASE DISCUSSED IN MULTIDISCIPLINARY ROUNDS WITH ICU TEAM  Critical Care Time devoted to patient care services described in this note is 45  minutes.   Overall, patient is critically ill, prognosis is guarded.     Corrin Parker, M.D.  [facility]  Medical Director [place] Director [facility] Cardio-Pulmonary Department

## 2020-12-26 LAB — CBC WITH DIFFERENTIAL/PLATELET
Abs Immature Granulocytes: 0.01 10*3/uL (ref 0.00–0.07)
Basophils Absolute: 0 10*3/uL (ref 0.0–0.1)
Basophils Relative: 1 %
Eosinophils Absolute: 0.3 10*3/uL (ref 0.0–0.5)
Eosinophils Relative: 5 %
HCT: 28.5 % — ABNORMAL LOW (ref 36.0–46.0)
Hemoglobin: 8.7 g/dL — ABNORMAL LOW (ref 12.0–15.0)
Immature Granulocytes: 0 %
Lymphocytes Relative: 18 %
Lymphs Abs: 1.3 10*3/uL (ref 0.7–4.0)
MCH: 26.3 pg (ref 26.0–34.0)
MCHC: 30.5 g/dL (ref 30.0–36.0)
MCV: 86.1 fL (ref 80.0–100.0)
Monocytes Absolute: 0.5 10*3/uL (ref 0.1–1.0)
Monocytes Relative: 7 %
Neutro Abs: 5 10*3/uL (ref 1.7–7.7)
Neutrophils Relative %: 69 %
Platelets: 377 10*3/uL (ref 150–400)
RBC: 3.31 MIL/uL — ABNORMAL LOW (ref 3.87–5.11)
RDW: 19.9 % — ABNORMAL HIGH (ref 11.5–15.5)
WBC: 7.3 10*3/uL (ref 4.0–10.5)
nRBC: 0 % (ref 0.0–0.2)

## 2020-12-26 LAB — RENAL FUNCTION PANEL
Albumin: 2.6 g/dL — ABNORMAL LOW (ref 3.5–5.0)
Anion gap: 10 (ref 5–15)
BUN: 24 mg/dL — ABNORMAL HIGH (ref 6–20)
CO2: 27 mmol/L (ref 22–32)
Calcium: 9.2 mg/dL (ref 8.9–10.3)
Chloride: 104 mmol/L (ref 98–111)
Creatinine, Ser: 0.64 mg/dL (ref 0.44–1.00)
GFR, Estimated: >60 mL/min (ref >60)
Glucose, Bld: 149 mg/dL — ABNORMAL HIGH (ref 70–99)
Phosphorus: 4.6 mg/dL (ref 2.5–4.6)
Potassium: 3.8 mmol/L (ref 3.5–5.1)
Sodium: 141 mmol/L (ref 135–145)

## 2020-12-26 LAB — MAGNESIUM: Magnesium: 2.2 mg/dL (ref 1.7–2.4)

## 2020-12-26 LAB — GLUCOSE, CAPILLARY
Glucose-Capillary: 123 mg/dL — ABNORMAL HIGH (ref 70–99)
Glucose-Capillary: 140 mg/dL — ABNORMAL HIGH (ref 70–99)
Glucose-Capillary: 142 mg/dL — ABNORMAL HIGH (ref 70–99)
Glucose-Capillary: 143 mg/dL — ABNORMAL HIGH (ref 70–99)
Glucose-Capillary: 164 mg/dL — ABNORMAL HIGH (ref 70–99)
Glucose-Capillary: 169 mg/dL — ABNORMAL HIGH (ref 70–99)

## 2020-12-26 MED ORDER — PANTOPRAZOLE SODIUM 40 MG PO PACK
40.0000 mg | PACK | Freq: Every day | ORAL | Status: DC
  Administered 2020-12-26 – 2021-01-07 (×12): 40 mg
  Filled 2020-12-26 (×14): qty 20

## 2020-12-26 MED ORDER — KATE FARMS STANDARD 1.4 PO LIQD
165.0000 mL | Freq: Four times a day (QID) | ORAL | Status: AC
  Administered 2020-12-26 (×3): 165 mL
  Filled 2020-12-26: qty 325

## 2020-12-26 MED ORDER — KATE FARMS STANDARD 1.4 PO LIQD
325.0000 mL | Freq: Four times a day (QID) | ORAL | Status: DC
  Administered 2020-12-27 – 2021-01-06 (×30): 325 mL
  Filled 2020-12-26: qty 325

## 2020-12-26 MED ORDER — QUETIAPINE FUMARATE 25 MG PO TABS
25.0000 mg | ORAL_TABLET | Freq: Every day | ORAL | Status: DC
  Administered 2020-12-26 – 2021-01-09 (×15): 25 mg
  Filled 2020-12-26 (×15): qty 1

## 2020-12-26 MED ORDER — POTASSIUM CHLORIDE 20 MEQ PO PACK
40.0000 meq | PACK | Freq: Once | ORAL | Status: AC
  Administered 2020-12-26: 40 meq
  Filled 2020-12-26: qty 2

## 2020-12-26 MED ORDER — FREE WATER
200.0000 mL | Freq: Four times a day (QID) | Status: DC
  Administered 2020-12-26 – 2021-01-02 (×29): 200 mL

## 2020-12-26 NOTE — Progress Notes (Signed)
[place] for Electrolyte Monitoring and Replacement   Recent Labs: Potassium (mmol/L)  Date Value  12/26/2020 3.8  02/20/2013 3.4 (L)   Magnesium (mg/dL)  Date Value  12/26/2020 2.2   Calcium (mg/dL)  Date Value  12/26/2020 9.2   Calcium, Total (mg/dL)  Date Value  02/20/2013 8.7   Albumin (g/dL)  Date Value  12/26/2020 2.6 (L)  02/20/2013 3.2 (L)   Phosphorus (mg/dL)  Date Value  12/26/2020 4.6   Sodium (mmol/L)  Date Value  12/26/2020 141  02/20/2013 137   Corrected Ca: 8.3 mg/dL  Assessment: 55 year old female presented to the ED via EMS post witnessed cardiac arrest. ROSC achieved following 3 defibrillations and one round of epinephrine. Patient found to have significant hypokalemia on arrival. Given witnessed arrest, patient Code ICE. Now s/p tracheostomy and PEG. Pharmacy to manage electrolytes.   Nutrition: ProSource BID + Vital 40 mL/hr  Goal of Therapy:  Potassium 4.0 - 5.1 mmol/L Magnesium 2.1 - 2.4 mg/dL All Other Electrolytes WNL  Plan:   Na 143, continue free water flushes 200 mL q4h (1.2 L/day)  Potassium 40 mEq per tube  Follow up with morning labs  Tawnya Crook, PharmD 12/26/2020 11:13 AM

## 2020-12-26 NOTE — Evaluation (Addendum)
Physical Therapy Evaluation Patient Details Name: [name] [name] MRN: [id number] DOB: Jun 11, 1966 Today's Date: 12/26/2020   History of Present Illness  Pt is a 55 year old female presented to the ED via EMS post witnessed cardiac arrest. ROSC achieved following 3 defibrillations and one round of epinephrine. Patient found to have significant hypokalemia on arrival. Given witnessed arrest, patient Code ICE.  S/p trach and peg tube placement.  Workup showed anoxic brain injury. Pt also showed facial twitching that could be myoclonic versus partial seizure.   Clinical Impression  Patient seen by PT twice; first for an evaluation, and no family at bedside. Pt able to visually track PT around the room, wiggle her toes, and ankles on command, as well as open her mouth and squeeze PT hands. Pt did exhibit some lethargy with first visit; intermittently cued for re-direction due to pt returning to resting with eyes closed. Pt exhibited weakness of UEs and LEs, and seemed to exhibit greater weakness on R sided extremities compared to L sided extremities. PROM of UE performed, and AAROM of LE exercises performed with constant, multimodal cueing. Pt on trach and ventilator throughout, vitals stable.  Second visit pt seen as co-treat with PT/OT for further mobility assessment as well as intervention. Pt more alert, on trach collar at 5L and nodded at OT to answer a question. Family also at bedside to answer PLOF and provide encouragement and support as needed, very motivated. Supine to sith <> maxAx2 to EOB. Varying levels of assistance needed for sitting balance, at least one instance of CGA (with OT stabilizing LEs). With fatigue pt did require more assist but was able to sit EOB for several minutes and perform balance activities with OT. Returned to supine maxAx2 and repositioned for comfort.  Overall the patient demonstrated deficits (see "PT Problem List") that impede the patient's functional abilities, safety,  and mobility and would benefit from skilled PT intervention. At this time, PT recommends transition to acute inpatient rehab upon discharge for high-intensity, post-acute rehab services due to the patient's potential for return of function and to [facility] outcomes. Family agreeable.       Follow Up Recommendations CIR    Equipment Recommendations  Other (comment) (TBD)    Recommendations for Other Services OT consult;Rehab consult     Precautions / Restrictions Precautions Precautions: Fall Restrictions Weight Bearing Restrictions: No      Mobility  Bed Mobility Overal bed mobility: Needs Assistance Bed Mobility: Supine to Sit;Sit to Supine     Supine to sit: Max assist;+2 for physical assistance;HOB elevated Sit to supine: Max assist;+2 for physical assistance        Transfers                 General transfer comment: deferred  Ambulation/Gait                Stairs            Wheelchair Mobility    Modified Rankin (Stroke Patients Only)       Balance Overall balance assessment: Needs assistance   Sitting balance-Leahy Scale: Poor Sitting balance - Comments: varying levels of seated balance throughout; max-one instance of CGA (with OT stabilizing LEs) Postural control: Posterior lean                                   Pertinent Vitals/Pain Pain Assessment: Faces Faces Pain Scale: Hurts a little  bit Pain Location: when coughing, pt exhibited some pain signs/symptoms Pain Descriptors / Indicators: Grimacing Pain Intervention(s): Limited activity within patient's tolerance;Monitored during session;Repositioned    Home Living                        Prior Function                 Hand Dominance        Extremity/Trunk Assessment   Upper Extremity Assessment Upper Extremity Assessment: Defer to OT evaluation    Lower Extremity Assessment Lower Extremity Assessment: Generalized weakness;RLE  deficits/detail;LLE deficits/detail RLE Deficits / Details: able to wiggle toes, ankle pump, AAROM for hip abduction/adduction, one rep AROM heel slides LLE Deficits / Details: able to wiggle toes, ankle pump, AAROM for hip abduction/adduction, able to  AROM heel slides independently    Cervical / Trunk Assessment Cervical / Trunk Assessment: Kyphotic  Communication      Cognition Arousal/Alertness: Awake/alert;Lethargic Behavior During Therapy: Flat affect Overall Cognitive Status: Difficult to assess                                 General Comments: pt non verbal, but did shake her head yes to one question      General Comments      Exercises General Exercises - Lower Extremity Ankle Circles/Pumps: AAROM;Strengthening;Both;5 reps Short Arc Quad: AAROM;Strengthening;Both;5 reps Heel Slides: PROM;AAROM;Strengthening;5 reps;Both Hip ABduction/ADduction: PROM;AAROM;Strengthening;Both;5 reps Other Exercises Other Exercises: PROM bilateral UEs shoulder flexion/abduction, elbow flexion/extension. Pt able to squeeze PT hands, L > . Clonus noted on RLE, not on LLE Other Exercises: seated balance activities wiht 2+ assist. Family very involved to motivate and assist as needed   Assessment/Plan    PT Assessment Patient needs continued PT services  PT Problem List Decreased strength;Decreased mobility;Decreased range of motion;Decreased activity tolerance;Decreased balance;Decreased knowledge of use of DME;Decreased cognition       PT Treatment Interventions DME instruction;Therapeutic exercise;Wheelchair mobility training;Balance training;Gait training;Stair training;Neuromuscular re-education;Functional mobility training;Therapeutic activities;Patient/family education    PT Goals (Current goals can be found in the Care Plan section)  Acute Rehab PT Goals Patient Stated Goal: to improve as much as possible PT Goal Formulation: With family Time For Goal Achievement:  01/09/21 Potential to Achieve Goals: Good    Frequency Min 2X/week   Barriers to discharge        Co-evaluation PT/OT/SLP Co-Evaluation/Treatment: Yes Reason for Co-Treatment: Complexity of the patient's impairments (multi-system involvement);For patient/therapist safety;To address functional/ADL transfers PT goals addressed during session: Mobility/safety with mobility;Balance;Proper use of DME;Strengthening/ROM OT goals addressed during session: Strengthening/ROM       AM-PAC PT "6 Clicks" Mobility  Outcome Measure Help needed turning from your back to your side while in a flat bed without using bedrails?: A Lot Help needed moving from lying on your back to sitting on the side of a flat bed without using bedrails?: A Lot Help needed moving to and from a bed to a chair (including a wheelchair)?: Total Help needed standing up from a chair using your arms (e.g., wheelchair or bedside chair)?: Total Help needed to walk in hospital room?: Total Help needed climbing 3-5 steps with a railing? : Total 6 Click Score: 8    End of Session Equipment Utilized During Treatment: Oxygen (ventilator on trach collar first session; second session trach collar on 5L) Activity Tolerance: Patient tolerated treatment well Patient left: in bed;with call  bell/phone within reach;with family/visitor present (with OT at bedside) Nurse Communication: Mobility status PT Visit Diagnosis: Other abnormalities of gait and mobility (R26.89);Muscle weakness (generalized) (M62.81);Difficulty in walking, not elsewhere classified (R26.2);Other symptoms and signs involving the nervous system (R29.898)   Time: 1111 - 1128 PT Time Calculation (min) (ACUTE ONLY): 17 min Charges:        PT Evaluation          $Eval High Complexity: 1 mod          $ Therapeutic Exercise: 8-22 mins Time: 7425-9563 PT Time Calculation (min) (ACUTE ONLY): 14 min Charges:    PT Treatments $Therapeutic Exercise: 8-22 mins       Lieutenant Diego PT, DPT 3:57 PM,12/26/20

## 2020-12-26 NOTE — Evaluation (Signed)
Occupational Therapy Evaluation Patient Details Name: [name] [name] MRN: [id number] DOB: 08-14-1966 Today's Date: 12/26/2020    History of Present Illness Pt is a 55 year old female presented to the ED via EMS post witnessed cardiac arrest. ROSC achieved following 3 defibrillations and one round of epinephrine. Patient found to have significant hypokalemia on arrival. Given witnessed arrest, patient Code ICE.  S/p trach and peg tube placement.  Workup showed anoxic brain injury. Pt also showed facial twitching that could be myoclonic versus partial seizure.   Clinical Impression   Patient presenting with decreased I in self care, balance, functional mobility/transfer, endurance, safety awareness, cognition, and strength.Pt's husband present during evaluation and helps to motivate pt for session.  Patient was fully independent and lives with spouse PTA. Pt currently on 5L with trach collar during session with all vitals remaining WFLs. Pt does nod to one question but is internally and externally distracted throughout. Hand over hand assistance for self care tasks. +2 assistance to EOB with focus on anterior weight shift and weight shifts back to midline orientation. Pt needing assistance for trunk support and blocked LEs for safety but remained EOB for ~ 20 minutes with task before returning to bed. OT discussed recommendation for CIR based on this session and pt's PLOF. Patient will benefit from acute OT to increase overall independence in the areas of ADLs, functional mobility, and safety awareness in order to safely discharge to next venue of care.    Follow Up Recommendations  CIR    Equipment Recommendations  Other (comment) (defer to next venue of care)       Precautions / Restrictions Precautions Precautions: Fall Precaution Comments: trach collar Restrictions Weight Bearing Restrictions: No      Mobility Bed Mobility Overal bed mobility: Needs Assistance Bed Mobility: Supine to  Sit;Sit to Supine     Supine to sit: Max assist;+2 for physical assistance;HOB elevated Sit to supine: Max assist;+2 for physical assistance        Transfers        General transfer comment: deferred for pt therapist safety    Balance Overall balance assessment: Needs assistance   Sitting balance-Leahy Scale: Poor Sitting balance - Comments: varying levels of seated balance throughout; max-one instance of CGA (with OT stabilizing LEs) Postural control: Posterior lean                                 ADL either performed or assessed with clinical judgement   ADL Overall ADL's : Needs assistance/impaired     Grooming: Wash/dry hands;Wash/dry face;Oral care;Bed level;Total assistance Grooming Details (indicate cue type and reason): hand over hand assistance for tasks                                     Vision Patient Visual Report: No change from baseline              Pertinent Vitals/Pain Pain Assessment: Faces Faces Pain Scale: Hurts a little bit Pain Location: when coughing, pt exhibited some pain signs/symptoms Pain Descriptors / Indicators: Grimacing Pain Intervention(s): Limited activity within patient's tolerance;Monitored during session;Repositioned     Hand Dominance Right   Extremity/Trunk Assessment Upper Extremity Assessment Upper Extremity Assessment: RUE deficits/detail;LUE deficits/detail RUE Deficits / Details: Pt able to sqeeze hand on commands but unable to lift UE against gravity. PROM WFLs.  Hand over hand for tasks LUE Deficits / Details: OT observed pt lift and bend at elbow but unable to perform on commands. No functional at this time. HOH for tasks   Lower Extremity Assessment Lower Extremity Assessment: Defer to PT evaluation RLE Deficits / Details: able to wiggle toes, ankle pump, AAROM for hip abduction/adduction, one rep AROM heel slides LLE Deficits / Details: able to wiggle toes, ankle pump, AAROM for hip  abduction/adduction, able to  AROM heel slides independently   Cervical / Trunk Assessment Cervical / Trunk Assessment: Kyphotic   Communication Communication Communication: Tracheostomy   Cognition Arousal/Alertness: Awake/alert;Lethargic Behavior During Therapy: Flat affect Overall Cognitive Status: Difficult to assess                                 General Comments: pt non verbal, but did shake her head yes to one question. Pt is cooperative.      Exercises General Exercises - Lower Extremity Ankle Circles/Pumps: AAROM;Strengthening;Both;5 reps Short Arc Quad: AAROM;Strengthening;Both;5 reps Heel Slides: PROM;AAROM;Strengthening;5 reps;Both Hip ABduction/ADduction: PROM;AAROM;Strengthening;Both;5 reps Other Exercises Other Exercises: PROM bilateral UEs shoulder flexion/abduction, elbow flexion/extension. Pt able to squeeze PT hands, L > . Clonus noted on RLE, not on LLE Other Exercises: seated balance activities wiht 2+ assist. Family very involved to motivate and assist as needed   Shoulder Instructions      Home Living Family/patient expects to be discharged to:: Inpatient rehab Living Arrangements: Spouse/significant other;Children Available Help at Discharge: Family;Available 24 hours/day Type of Home: House                           Additional Comments: Pt's husband and grown children are very dedicated to her care. Pt was independent PTA.      Prior Functioning/Environment Level of Independence: Independent                 OT Problem List: Decreased strength;Decreased activity tolerance;Decreased range of motion;Impaired balance (sitting and/or standing);Decreased coordination;Decreased knowledge of use of DME or AE;Impaired UE functional use;Cardiopulmonary status limiting activity;Decreased safety awareness;Decreased knowledge of precautions;Pain;Decreased cognition      OT Treatment/Interventions: Self-care/ADL training;Manual  therapy;Therapeutic exercise;Energy conservation;DME and/or AE instruction;Cognitive remediation/compensation;Therapeutic activities;Balance training;Patient/family education    OT Goals(Current goals can be found in the care plan section) Acute Rehab OT Goals Patient Stated Goal: to improve as much as possible OT Goal Formulation: With family Time For Goal Achievement: 01/09/21 Potential to Achieve Goals: Fair ADL Goals Pt Will Perform Grooming: sitting;with min assist Pt Will Perform Upper Body Bathing: sitting;with mod assist Pt Will Transfer to Toilet: squat pivot transfer;with mod assist  OT Frequency: Min 3X/week   Barriers to D/C:    none known at this time       Co-evaluation PT/OT/SLP Co-Evaluation/Treatment: Yes Reason for Co-Treatment: Complexity of the patient's impairments (multi-system involvement);For patient/therapist safety;To address functional/ADL transfers PT goals addressed during session: Mobility/safety with mobility;Balance;Proper use of DME;Strengthening/ROM OT goals addressed during session: Strengthening/ROM;ADL's and self-care      AM-PAC OT "6 Clicks" Daily Activity     Outcome Measure Help from another person eating meals?: Total Help from another person taking care of personal grooming?: Total Help from another person toileting, which includes using toliet, bedpan, or urinal?: Total Help from another person bathing (including washing, rinsing, drying)?: Total Help from another person to put on and taking off regular upper body clothing?: Total  Help from another person to put on and taking off regular lower body clothing?: Total 6 Click Score: 6   End of Session Equipment Utilized During Treatment: Oxygen Nurse Communication: Mobility status  Activity Tolerance: Patient tolerated treatment well Patient left: in bed;with call bell/phone within reach;with bed alarm set;with family/visitor present  OT Visit Diagnosis: Unsteadiness on feet  (R26.81);Muscle weakness (generalized) (M62.81)                Time: 3094-0768 OT Time Calculation (min): 38 min Charges:  OT General Charges $OT Visit: 1 Visit OT Evaluation $OT Eval High Complexity: 1 High OT Treatments $Therapeutic Activity: 8-22 mins   Darleen Crocker, MS, OTR/L , CBIS ascom [phone]  12/26/20, 4:46 PM   12/26/2020, 4:45 PM

## 2020-12-26 NOTE — Progress Notes (Signed)
Daily Progress Note   Patient Name: [name] [name]       Date: 12/26/2020 DOB: 06-13-1966  Age: 55 y.o. MRN#: [id number] Attending Physician: Flora Lipps, MD Primary Care Physician: Marinda Elk, MD Admit Date: 12/02/2020  Reason for Consultation/Follow-up: Establishing goals of care  Subjective: Patient awake, follows commands. Shakes head no when asked if in pain. Spouse at bedside.   Length of Stay: 24  Current Medications: Scheduled Meds:  . sodium chloride   Intravenous Once  . alteplase  2 mg Intracatheter Once  . amLODipine  10 mg Per Tube Daily  . atorvastatin  80 mg Per Tube Daily  . chlorhexidine gluconate (MEDLINE KIT)  15 mL Mouth Rinse BID  . Chlorhexidine Gluconate Cloth  6 each Topical Daily  . clonazePAM  1 mg Per Tube BID  . feeding supplement ([employer] STANDARD 1.4)  165 mL Per Tube QID  . [START ON 12/27/2020] feeding supplement (KATE [employer]ARMS STANDARD 1.4)  325 mL Per Tube QID  . free water  200 mL Per Tube QID  . heparin  5,000 Units Subcutaneous Q8H  . insulin aspart  0-15 Units Subcutaneous Q4H  . levETIRAcetam  500 mg Per Tube BID  . mouth rinse  15 mL Mouth Rinse 10 times per day  . multivitamin with minerals  1 tablet Per Tube Daily  . oxyCODONE  10 mg Per Tube Q6H  . pantoprazole sodium  40 mg Per Tube QHS  . polyethylene glycol  17 g Per Tube Daily  . QUEtiapine  25 mg Per Tube QHS  . senna-docusate  1 tablet Per Tube Daily  . sodium chloride flush  10-40 mL Intracatheter Q12H    Continuous Infusions: . sodium chloride 250 mL (12/19/20 0449)  . dexmedetomidine (PRECEDEX) IV infusion Stopped (12/24/20 1253)  . dexmedetomidine (PRECEDEX) IV infusion 0.4 mcg/kg/hr (12/26/20 0025)    PRN Meds: acetaminophen, cloNIDine, guaiFENesin, hydrALAZINE,  HYDROmorphone (DILAUDID) injection, midazolam, morphine injection, sodium chloride flush  Physical Exam Constitutional:      Comments: Follows commands, shakes head yes or no to questions  Pulmonary:     Effort: Pulmonary effort is normal.  Skin:    General: Skin is warm and dry.  Neurological:     Mental Status: She is alert.             Vital Signs: BP (!) 168/91   Pulse (!) 118   Temp 97.9 F (36.6 C) (Axillary)   Resp 20   Ht 5' 0.98" (1.549 m)   Wt 83 kg   SpO2 97%   BMI 34.59 kg/m  SpO2: SpO2: 97 % O2 Device: O2 Device: Tracheostomy Collar O2 Flow Rate: O2 Flow Rate (L/min): 5 L/min  Intake/output summary:   Intake/Output Summary (Last 24 hours) at 12/26/2020 1407 Last data filed at 12/26/2020 1300 Gross per 24 hour  Intake 2832.67 ml  Output 1350 ml  Net 1482.67 ml   LBM: Last BM Date: 12/25/20 Baseline Weight: Weight: 90 kg Most recent weight: Weight: 83 kg       Palliative Assessment/Data: PPS 50%    Flowsheet Rows   Flowsheet Row Most Recent Value  Intake Tab   Referral Department  Critical care  Unit at Time of Referral ICU  Palliative Care Primary Diagnosis Pulmonary  Date Notified 12/05/20  Palliative Care Type New Palliative care  Reason for referral Clarify Goals of Care  Date of Admission 12/02/20  Date first seen by Palliative Care 12/05/20  # of days Palliative referral response time 0 Day(s)  # of days IP prior to Palliative referral 3  Clinical Assessment   Psychosocial & Spiritual Assessment   Palliative Care Outcomes   Patient/Family meeting held? No  Palliative Care Outcomes Clarified goals of care, Provided advance care planning, Provided psychosocial or spiritual support, Changed CPR status      Patient Active Problem List   Diagnosis Date Noted  . Acute respiratory failure ([place])   . Hypoxic ischemic encephalopathy   . Endotracheal tube present   . Cardiac arrest [facility]) 12/02/2020    Palliative Care Assessment & Plan    HPI: 55 y.o. female  with past medical history of HTN admitted on 12/02/2020 with v fib arrest. Hypothermia protocol initiated.  Developed myoclonic jerking 1/12. Unable to follow commands. Per neuro, MRI consistent with anoxic brain injury. PMT consulted to discuss [place].   Assessment: Follow up today with patient and spouse. Since my last visit 2.5 weeks ago patient has had trach and peg placed. Today is off vent, on trach collar. She is more interactive and able to follow commands.   Spouse shares about being able to interact with patient and how much joy this brings him. We discuss long term plans. Spouse is planning on taking patient home with support of home health. He tells me about his 2 daughters who plan to help care for patient while he is at work.   We discuss outpatient palliative care for support outpatient. Spouse is agreeable to this.   Emotional support provided. Spouse has my contact information for further questions and concerns.  Recommendations/Plan:  Outpatient palliative notified to follow at home  Will shadow chart  Code Status:  Limited code-  NO CPR  Prognosis:   Unable to determine  Discharge Planning:  Home with [place] was discussed with patient's spouse  Thank you for allowing the Palliative Medicine Team to assist in the care of this patient.   Total Time 20 minutes Prolonged Time Billed  no       Greater than 50%  of this time was spent counseling and coordinating care related to the above assessment and plan.  Juel Burrow, DNP, [facility] Palliative Medicine Team Team Phone # [phone]  Pager [phone]

## 2020-12-26 NOTE — Progress Notes (Signed)
Nutrition Follow-up  DOCUMENTATION CODES:   Obesity unspecified  INTERVENTION:  Initiate bolus regimen: 2/3: Provide [employer] Standard 1.4 Cal 1/2 carton (165 mL) 4 times daily per tube 2/4: Advance to goal regimen of [employer] Standard 1.4 Cal 1 carton (325 mL) 4 times daily per tube Provides 1820 kcal, 80 grams of protein, 936 mL H2O daily.  Flush tube with 30 mL before and after each bolus tube feed. Provide an additional flush of 200 mL 4 times daily between feeds. Provides a total of 1976 mL H2O daily including water in tube feeding regimen. Patient will also receive free water flush with medications.  NUTRITION DIAGNOSIS:   Inadequate oral intake related to inability to eat as evidenced by NPO status.  Ongoing.  GOAL:   Patient will meet greater than or equal to 90% of their needs  Met with TF regimen.  MONITOR:   Vent status,Labs,Weight trends,TF tolerance,I & O's  REASON FOR ASSESSMENT:   Ventilator,Consult Enteral/tube feeding initiation and management  ASSESSMENT:   55 year old female with PMHx of HTN admitted after acute cardiac arrest also with DKA and severe acidosis.  1/10 intubated 1/21 s/p tracheostomy tube placement 1/21 s/p G-tube placement  Patient tolerated 12 hours of trach collar yesterday. Overnight she was on FiO2 28%, PEEP 5 cmH2O, pressure support 5 cmH2O. Plan is to put patient back on trach collar today. Plan for patient to discharge home once ready so discussed transitioning patient to bolus TF regimen.  Medications reviewed and include: free water flush 200 mL Q4hrs, Novolog 0-15 units Q4hrs, Keppra, MVI daily, Protonix, Miralax, senna-docusate 1 tablet daily, Precedex gtt.  Labs reviewed: CBG 140-171, BUN 22.  Enteral Access: 20 Fr. Endovive Safety G-tube  I/O: 1300 mL UOP yesterday (0.7 mL/kg/hr)  Weight trend: 83 kg on 2/3; -3.2 kg from 1/10  Diet Order:   Diet Order    None     EDUCATION NEEDS:   No education needs  have been identified at this time  Skin:  Skin Assessment: Reviewed RN Assessment  Last BM:  12/25/2020 - medium type 7  Height:   Ht Readings from Last 1 Encounters:  12/17/20 5' 0.98" (1.549 m)   Weight:   Wt Readings from Last 1 Encounters:  12/26/20 83 kg   Ideal Body Weight:  47.7 kg  BMI:  Body mass index is 34.59 kg/m.  Estimated Nutritional Needs:   Kcal:  1700-1900  Protein:  85-95 grams  Fluid:  >/= 2 L/day  Jacklynn Barnacle, MS, RD, LDN Pager number available on Amion

## 2020-12-26 NOTE — Progress Notes (Signed)
[facility]) Hospital Liaison RN note:  Received new referral for [facility] out patient palliative program to follow post discharge from Kathie Rhodes, NP. Patient information given to referral. Adelene Amas, TOC is aware. Plan is for discharge home with family. [place] Liaison will follow for disposition.  Thank you for this referral.  Zandra Abts, RN [facility] Liaison [phone]

## 2020-12-26 NOTE — Progress Notes (Signed)
Inpatient Rehab Admissions:  Inpatient Rehab Consult received.  Note pt still intermittently requiring mechanical ventilation.  We cannot accept patients still requiring the vent.  Will follow from a distance for now.    Signed: Shann Medal, PT, DPT Admissions Coordinator [phone] 12/26/20  4:32 PM

## 2020-12-26 NOTE — Progress Notes (Signed)
CRITICAL CARE NOTE [name] with acute cardiac arrest Labs c/w severe acidosis and severe DKA Patient with witnessed V fib arrest and shocked in the field ROSC returned is unknown   1/10 admitted to ICU for cardiac arrest, started hypothermia protocol 1/11 hypothermia protocol +posturing 1/12 signs of brain damage, needs NEURO consultation 1/13 failed SAT/SBT, unable to follow commands 1/14- for SBT today, electrolytes are improved. Encephalopathic working on mental status. 12/07/20-Patient remains crtically ill and is with interval worsening MRI brain, I met with husband to briefly review. Discussed case with Neurologist today and there is plan for meeting with family. 12/14/20- patient for LTAC placement s/p PEG/trache. No events today. 1/23severe brain damage 1/24 severe Brain Damage, severe agitation 1/25 severe brain damage 1/27 bouts ofventilator asynchrony 1/30 focal seizure activity around mouth noted started on Keppra neurology re consulted 1/31 severe agitation, TCT 1 hr 2/2 severe agitation, remains on vent, TCT for 12 hrs   CC  follow up respiratory failure  SUBJECTIVE remains  ill Prognosis is guarded Tolerated TCT 12 hrs yesterday  BP (!) 206/93   Pulse (!) 114   Temp 98.8 F (37.1 C) (Axillary)   Resp (!) 28   Ht 5' 0.98" (1.549 m)   Wt 83 kg   SpO2 99%   BMI 34.59 kg/m    I/O last 3 completed shifts: In: 7711 [I.V.:70; NG/GT:1440] Out: 2100 [Urine:2100] No intake/output data recorded.  SpO2: 99 % O2 Flow Rate (L/min): 5 L/min FiO2 (%): 28 %  Estimated body mass index is 34.59 kg/m as calculated from the following:   Height as of this encounter: 5' 0.98" (1.549 m).   Weight as of this encounter: 83 kg.  REVIEW OF SYSTEMS  PATIENT IS UNABLE TO PROVIDE COMPLETE REVIEW OF SYSTEMS DUE TO SEVERE brain injury       PHYSICAL EXAMINATION:  GENERAL ill appearing,  EYES: Pupils equal, round, reactive to light.  No scleral icterus.   NECK:s/p trach PULMONARY: +rhonchi,  CARDIOVASCULAR: S1 and S2. Regular rate and rhythm. No murmurs, rubs, or gallops.  GASTROINTESTINAL: Soft, nontender, -distended.  Positive bowel sounds.   MUSCULOSKELETAL: No swelling, clubbing, or edema.  NEUROLOGIC: more alert and awake SKIN:intact,warm,dry  MEDICATIONS: I have reviewed all medications and confirmed regimen as documented    Nutrition Status: Nutrition Problem: Inadequate oral intake Etiology: inability to eat Signs/Symptoms: NPO status Interventions: Tube feeding,Prostat,MVI     Indwelling Urinary Catheter continued, requirement due to   Reason to continue Indwelling Urinary Catheter strict Intake/Output monitoring for hemodynamic instability   Central Line/ continued, requirement due to  Reason to continue [place] of central venous pressure or other hemodynamic parameters and poor IV access   Ventilator continued, requirement due to severe respiratory failure   Ventilator Sedation RASS 0 to -2      ASSESSMENT AND PLAN SYNOPSIS 55 y.o.obese [name] admiktted for acute cardiac arrest due to Severe acidosis with DKAwith hx of HTN p/w myoclonic jerking and unresponsiveness following cardiac arrest with unknown down time, initial rhythm of Vfib, prolonged ACLS before ROSC, now s/p hypothermia protocol. MRI Brain consistent with diffuse hypoxic/anoxic injury- brain damage s/p trach and PEG  Severe ACUTE Hypoxic and Hypercapnic Respiratory Failure -continue Bronchodilator Therapy -Wean Fio2 and PEEP as tolerated -will perform SAT/SBT when respiratory parameters are met -VAP/VENT bundle implementation TCT as tolerated  ACUTE SYSTOLIC CARDIAC FAILURE- EF -oxygen as needed -Lasix as tolerated   Morbid obesity, possible OSA.   Will certainly impact respiratory mechanics  S/p trach     NEUROLOGY Acute toxic metabolic encephalopathy Brain Injury-very slow improvement Follow up neuro recs Off  precedex    CARDIAC ICU monitoring   DIET-->TF's as tolerated Constipation protocol as indicated  ENDO - will use ICU hypoglycemic\Hyperglycemia protocol if indicated     ELECTROLYTES -follow labs as needed -replace as needed -pharmacy consultation and following   DVT/GI PRX ordered and assessed TRANSFUSIONS AS NEEDED MONITOR FSBS I Assessed the need for Labs I Assessed the need for Foley I Assessed the need for Central Venous Line Family Discussion when available I Assessed the need for Mobilization I made an Assessment of medications to be adjusted accordingly Safety Risk assessment completed   CASE DISCUSSED IN MULTIDISCIPLINARY ROUNDS WITH ICU TEAM   Overall,  prognosis is guarded.     Corrin Parker, M.D.  [facility]  Medical Director [place] Director [facility] Cardio-Pulmonary Department

## 2020-12-27 LAB — RENAL FUNCTION PANEL
Albumin: 3 g/dL — ABNORMAL LOW (ref 3.5–5.0)
Anion gap: 10 (ref 5–15)
BUN: 20 mg/dL (ref 6–20)
CO2: 26 mmol/L (ref 22–32)
Calcium: 9.5 mg/dL (ref 8.9–10.3)
Chloride: 104 mmol/L (ref 98–111)
Creatinine, Ser: 0.49 mg/dL (ref 0.44–1.00)
GFR, Estimated: >60 mL/min (ref >60)
Glucose, Bld: 131 mg/dL — ABNORMAL HIGH (ref 70–99)
Phosphorus: 3.6 mg/dL (ref 2.5–4.6)
Potassium: 3.7 mmol/L (ref 3.5–5.1)
Sodium: 140 mmol/L (ref 135–145)

## 2020-12-27 LAB — GLUCOSE, CAPILLARY
Glucose-Capillary: 110 mg/dL — ABNORMAL HIGH (ref 70–99)
Glucose-Capillary: 120 mg/dL — ABNORMAL HIGH (ref 70–99)
Glucose-Capillary: 123 mg/dL — ABNORMAL HIGH (ref 70–99)
Glucose-Capillary: 134 mg/dL — ABNORMAL HIGH (ref 70–99)
Glucose-Capillary: 139 mg/dL — ABNORMAL HIGH (ref 70–99)
Glucose-Capillary: 165 mg/dL — ABNORMAL HIGH (ref 70–99)

## 2020-12-27 LAB — CBC WITH DIFFERENTIAL/PLATELET
Abs Immature Granulocytes: 0.02 10*3/uL (ref 0.00–0.07)
Basophils Absolute: 0 10*3/uL (ref 0.0–0.1)
Basophils Relative: 1 %
Eosinophils Absolute: 0.3 10*3/uL (ref 0.0–0.5)
Eosinophils Relative: 4 %
HCT: 32.1 % — ABNORMAL LOW (ref 36.0–46.0)
Hemoglobin: 9.5 g/dL — ABNORMAL LOW (ref 12.0–15.0)
Immature Granulocytes: 0 %
Lymphocytes Relative: 22 %
Lymphs Abs: 1.9 10*3/uL (ref 0.7–4.0)
MCH: 25.2 pg — ABNORMAL LOW (ref 26.0–34.0)
MCHC: 29.6 g/dL — ABNORMAL LOW (ref 30.0–36.0)
MCV: 85.1 fL (ref 80.0–100.0)
Monocytes Absolute: 0.7 10*3/uL (ref 0.1–1.0)
Monocytes Relative: 8 %
Neutro Abs: 5.7 10*3/uL (ref 1.7–7.7)
Neutrophils Relative %: 65 %
Platelets: 392 10*3/uL (ref 150–400)
RBC: 3.77 MIL/uL — ABNORMAL LOW (ref 3.87–5.11)
RDW: 19.6 % — ABNORMAL HIGH (ref 11.5–15.5)
WBC: 8.7 10*3/uL (ref 4.0–10.5)
nRBC: 0 % (ref 0.0–0.2)

## 2020-12-27 LAB — MAGNESIUM: Magnesium: 1.9 mg/dL (ref 1.7–2.4)

## 2020-12-27 MED ORDER — POTASSIUM CHLORIDE 20 MEQ PO PACK
40.0000 meq | PACK | Freq: Once | ORAL | Status: AC
  Administered 2020-12-27: 40 meq
  Filled 2020-12-27: qty 2

## 2020-12-27 NOTE — Progress Notes (Signed)
[place] for Electrolyte Monitoring and Replacement   Recent Labs: Potassium (mmol/L)  Date Value  12/27/2020 3.7  02/20/2013 3.4 (L)   Magnesium (mg/dL)  Date Value  12/27/2020 1.9   Calcium (mg/dL)  Date Value  12/27/2020 9.5   Calcium, Total (mg/dL)  Date Value  02/20/2013 8.7   Albumin (g/dL)  Date Value  12/27/2020 3.0 (L)  02/20/2013 3.2 (L)   Phosphorus (mg/dL)  Date Value  12/27/2020 3.6   Sodium (mmol/L)  Date Value  12/27/2020 140  02/20/2013 137   Corrected Ca: 8.3 mg/dL  Assessment: 55 year old female presented to the ED via EMS post witnessed cardiac arrest. ROSC achieved following 3 defibrillations and one round of epinephrine. Patient found to have significant hypokalemia on arrival. Given witnessed arrest, patient Code ICE. Now s/p tracheostomy and PEG. Pharmacy to manage electrolytes.   Nutrition: ProSource BID + Vital 40 mL/hr  Goal of Therapy:  Potassium 4.0 - 5.1 mmol/L Magnesium 2.1 - 2.4 mg/dL All Other Electrolytes WNL  Plan:   Na 140, continue free water flushes 200 mL q4h (1.2 L/day)  Potassium 40 mEq per tube. Patient has been receiving potassium supplementation most every day to maintain K 3.5-4.0.  Follow up with morning labs  Tawnya Crook, PharmD 12/27/2020 8:17 AM

## 2020-12-27 NOTE — Progress Notes (Signed)
CRITICAL CARE PROGRESS NOTE    Name: [name] [name] MRN: [id number] DOB: 12-10-1965     LOS: 5   SUBJECTIVE FINDINGS & SIGNIFICANT EVENTS    Patient description:    [name] with acute cardiac arrest Labs c/w severe acidosis and severe DKA Patient with witnessed V fib arrest and shocked in the field ROSC returned patient admitted to MICU    Subjective findings:   1/10 admitted to ICU for cardiac arrest, started hypothermia protocol 1/11 hypothermia protocol +posturing 1/12 signs of brain damage, needs NEURO consultation 1/13 failed SAT/SBT, unable to follow commands 1/14- for SBT today, electrolytes are improved. Encephalopathic working on mental status. 12/07/20-Patient remains crtically ill and is with interval worsening MRI brain, I met with husband to briefly review. Discussed case with Neurologist today and there is plan for meeting with family. 12/14/20- patient for LTAC placement s/p PEG/trache. No events today. 1/23severe brain damage 1/24 severe Brain Damage, severe agitation 1/25 severe brain damage 1/27 bouts ofventilator asynchrony 1/30 focal seizure activity around mouth noted started on [place] neurology re consulted 1/31 severe agitation, TCT 1 hr 2/2 severe agitation, remains on vent, TCT for 12 hrs 12/27/2020 - patient with accelerated HTN during exam this am, shes moving extermitiesx4 but encephalopathic. 12/28/2020-  Patient unable to communicate and is s/p trache and PEG, no acute events overnight.    Lines/tubes : PICC Double Lumen 12/12/20 PICC Left Brachial 42 cm 1 cm (Active)  Indication for Insertion or Continuance of Line Prolonged intravenous therapies 12/27/20 2035  Exposed Catheter (cm) 1 cm 12/13/20 0400  Site Assessment Clean;Dry;Intact 12/28/20 0650  Lumen #1  Status Blood return noted;Flushed;Cap changed 12/28/20 0650  Lumen #2 Status Cap changed;Flushed;Blood return noted 12/28/20 0650  Dressing Type Transparent 12/28/20 0650  Dressing Status Clean;Dry;Intact 12/28/20 0650  Antimicrobial disc in place? Yes 12/28/20 0650  Safety Lock Not Applicable 62/22/97 9892  Line Care Connections checked and tightened 12/28/20 0650  Line Adjustment (NICU/IV Team Only) No 12/22/20 0000  Dressing Intervention Dressing changed;Antimicrobial disc changed 12/22/20 0500  Dressing Change Due 12/29/20 12/28/20 0650     Gastrostomy/Enterostomy Percutaneous endoscopic gastrostomy (PEG) LUQ (Active)  Surrounding Skin Dry;Intact 12/28/20 0319  Tube Status Patent 12/28/20 0319  Drainage Appearance None 12/25/20 2000  Dressing Status Clean;Dry;Intact 12/28/20 0319  Dressing Intervention Dressing changed 12/19/20 0000  Dressing Type Split gauze 12/28/20 0319  Dressing Change Due 12/25/20 12/24/20 0800  G Port Intake (mL) 165 ml 12/26/20 1600  Output (mL) 0 mL 12/17/20 1700     External Urinary Catheter (Active)  Collection Container Dedicated Suction Canister 12/28/20 0319  Securement Method Securing device (Describe) 12/28/20 0319  Site Assessment Clean;Intact 12/28/20 0319  Intervention Equipment Changed 12/27/20 1600  Output (mL) 0 mL 12/28/20 0319    Microbiology/Sepsis markers: Results for orders placed or performed during the hospital encounter of 12/02/20  Resp Panel by RT-PCR (Flu A&B, Covid) Nasopharyngeal Swab     Status: None   Collection Time: 12/02/20  7:23 AM   Specimen: Nasopharyngeal Swab; Nasopharyngeal(NP) swabs in vial transport medium  Result Value Ref Range Status   SARS Coronavirus 2 by RT PCR NEGATIVE NEGATIVE Final    Comment: (NOTE) SARS-CoV-2 target nucleic acids are NOT DETECTED.  The SARS-CoV-2 RNA is generally detectable in upper respiratory specimens during the acute phase of infection. The lowest concentration of SARS-CoV-2  viral copies this assay can detect is 138 copies/mL. A negative result does not preclude SARS-Cov-2 infection and should not be  used as the sole basis for treatment or other patient management decisions. A negative result may occur with  improper specimen collection/handling, submission of specimen other than nasopharyngeal swab, presence of viral mutation(s) within the areas targeted by this assay, and inadequate number of viral copies(<138 copies/mL). A negative result must be combined with clinical observations, patient history, and epidemiological information. The expected result is Negative.  Fact Sheet for Patients:  EntrepreneurPulse.com.au  Fact Sheet for Healthcare Providers:  IncredibleEmployment.be  This test is no t yet approved or cleared by the [place] FDA and  has been authorized for detection and/or diagnosis of SARS-CoV-2 by FDA under an Emergency Use Authorization (EUA). This EUA will remain  in effect (meaning this test can be used) for the duration of the COVID-19 declaration under Section 564(b)(1) of the Act, 21 U.S.C.section 360bbb-3(b)(1), unless the authorization is terminated  or revoked sooner.       Influenza A by PCR NEGATIVE NEGATIVE Final   Influenza B by PCR NEGATIVE NEGATIVE Final    Comment: (NOTE) The Xpert Xpress SARS-CoV-2/FLU/RSV plus assay is intended as an aid in the diagnosis of influenza from Nasopharyngeal swab specimens and should not be used as a sole basis for treatment. Nasal washings and aspirates are unacceptable for Xpert Xpress SARS-CoV-2/FLU/RSV testing.  Fact Sheet for Patients: EntrepreneurPulse.com.au  Fact Sheet for Healthcare Providers: IncredibleEmployment.be  This test is not yet approved or cleared by the [place] FDA and has been authorized for detection and/or diagnosis of SARS-CoV-2 by FDA under an Emergency Use Authorization  (EUA). This EUA will remain in effect (meaning this test can be used) for the duration of the COVID-19 declaration under Section 564(b)(1) of the Act, 21 U.S.C. section 360bbb-3(b)(1), unless the authorization is terminated or revoked.  Performed at [facility], [address] [place], [place] [address]   Urine culture     Status: None   Collection Time: 12/02/20  7:23 AM   Specimen: In/Out Cath Urine  Result Value Ref Range Status   Specimen Description   Final    IN/OUT CATH URINE Performed at [facility], [address] [place], [place] [address]    Special Requests   Final    NONE Performed at [facility], [address], [place], [place] [address]    Culture   Final    NO GROWTH Performed at [facility], [place] [address], [place], [place] [address]    Report Status 12/03/2020 FINAL  Final  Blood Culture (routine x 2)     Status: None   Collection Time: 12/02/20  8:37 AM   Specimen: BLOOD  Result Value Ref Range Status   Specimen Description BLOOD BLOOD RIGHT HAND  Final   Special Requests   Final    BOTTLES DRAWN AEROBIC AND ANAEROBIC Blood Culture adequate volume   Culture   Final    NO GROWTH 5 DAYS Performed at [facility], [address], [place], [place] [address]    Report Status 12/07/2020 FINAL  Final  Blood Culture (routine x 2)     Status: None   Collection Time: 12/02/20  8:37 AM   Specimen: BLOOD  Result Value Ref Range Status   Specimen Description BLOOD RIGHT ARM  Final   Special Requests   Final    BOTTLES DRAWN AEROBIC AND ANAEROBIC Blood Culture adequate volume   Culture   Final    NO GROWTH 5 DAYS Performed at [facility], [address] [place], [place] [address]    Report Status 12/07/2020 FINAL  Final  MRSA PCR Screening     Status: None   Collection Time: 12/02/20  9:33 AM   Specimen: Nasopharyngeal  Result Value Ref Range Status   MRSA by PCR NEGATIVE NEGATIVE Final     Comment:        The GeneXpert MRSA Assay (FDA approved for NASAL specimens only), is one component of a comprehensive MRSA colonization surveillance program. It is not intended to diagnose MRSA infection nor to guide or monitor treatment for MRSA infections. Performed at [facility], [address] [place], [place] [address]   Culture, blood (routine x 2)     Status: None   Collection Time: 12/10/20 11:28 AM   Specimen: BLOOD  Result Value Ref Range Status   Specimen Description BLOOD LINE  Final   Special Requests   Final    BOTTLES DRAWN AEROBIC AND ANAEROBIC Blood Culture adequate volume   Culture   Final    NO GROWTH 5 DAYS Performed at [facility], [address], [place], [place] [address]    Report Status 12/15/2020 FINAL  Final  Culture, blood (routine x 2)     Status: None   Collection Time: 12/10/20 11:38 AM   Specimen: BLOOD RIGHT HAND  Result Value Ref Range Status   Specimen Description BLOOD RIGHT HAND  Final   Special Requests   Final    BOTTLES DRAWN AEROBIC AND ANAEROBIC Blood Culture adequate volume   Culture   Final    NO GROWTH 5 DAYS Performed at [facility], [address], [place], [place] [address]    Report Status 12/15/2020 FINAL  Final  Culture, respiratory (non-expectorated)     Status: None   Collection Time: 12/10/20  2:12 PM   Specimen: Tracheal Aspirate; Respiratory  Result Value Ref Range Status   Specimen Description   Final    TRACHEAL ASPIRATE Performed at [facility], [address] [place], [place] [address]    Special Requests   Final    NONE Performed at [facility], [place], [place] [address]    Gram Stain   Final    RARE WBC PRESENT, PREDOMINANTLY PMN ABUNDANT GRAM POSITIVE COCCI FEW GRAM POSITIVE RODS RARE GRAM NEGATIVE RODS Performed at [facility], [place] [address], [place], [place] [address]    Culture ABUNDANT STAPHYLOCOCCUS AUREUS  Final    Report Status 12/13/2020 FINAL  Final   Organism ID, Bacteria STAPHYLOCOCCUS AUREUS  Final      Susceptibility   Staphylococcus aureus - MIC*    CIPROFLOXACIN <=0.5 SENSITIVE Sensitive     ERYTHROMYCIN <=0.25 SENSITIVE Sensitive     GENTAMICIN <=0.5 SENSITIVE Sensitive     OXACILLIN <=0.25 SENSITIVE Sensitive     TETRACYCLINE <=1 SENSITIVE Sensitive     VANCOMYCIN 1 SENSITIVE Sensitive     TRIMETH/SULFA <=10 SENSITIVE Sensitive     CLINDAMYCIN <=0.25 SENSITIVE Sensitive     RIFAMPIN <=0.5 SENSITIVE Sensitive     Inducible Clindamycin NEGATIVE Sensitive     * ABUNDANT STAPHYLOCOCCUS AUREUS  Urine Culture     Status: Abnormal   Collection Time: 12/10/20  2:30 PM   Specimen: Urine, Random  Result Value Ref Range Status   Specimen Description   Final    URINE, RANDOM Performed at [facility], [address] [place],  [address]    Special Requests  Final    NONE Performed at [facility], [place], [place] [address]    Culture 30,000 COLONIES/mL ENTEROCOCCUS FAECALIS (A)  Final   Report Status 12/13/2020 FINAL  Final   Organism ID, Bacteria ENTEROCOCCUS FAECALIS (A)  Final      Susceptibility   Enterococcus faecalis - MIC*    AMPICILLIN <=2 SENSITIVE Sensitive     NITROFURANTOIN <=16 SENSITIVE Sensitive     VANCOMYCIN 2 SENSITIVE Sensitive     * 30,000 COLONIES/mL ENTEROCOCCUS FAECALIS  Urine Culture     Status: Abnormal   Collection Time: 12/11/20  4:40 PM   Specimen: Urine, Random  Result Value Ref Range Status   Specimen Description   Final    URINE, RANDOM Performed at [facility], [address], [place], [place] [address]    Special Requests   Final    NONE Performed at [facility], [address], [place],  [address]    Culture >=100,000 COLONIES/mL ENTEROCOCCUS FAECALIS (A)  Final   Report Status 12/14/2020 FINAL  Final   Organism ID, Bacteria ENTEROCOCCUS FAECALIS (A)  Final       Susceptibility   Enterococcus faecalis - MIC*    AMPICILLIN <=2 SENSITIVE Sensitive     NITROFURANTOIN <=16 SENSITIVE Sensitive     VANCOMYCIN 2 SENSITIVE Sensitive     * >=100,000 COLONIES/mL ENTEROCOCCUS FAECALIS    Anti-infectives:  Anti-infectives (From admission, onward)   Start     Dose/Rate Route Frequency Ordered Stop   12/20/20 1000  fluconazole (DIFLUCAN) tablet 100 mg       "Followed by" Linked Group Details   100 mg Per Tube Daily 12/19/20 0717 12/25/20 0922   12/19/20 1000  fluconazole (DIFLUCAN) tablet 200 mg       "Followed by" Linked Group Details   200 mg Per Tube  Once 12/19/20 0717 12/19/20 1509   12/13/20 2200  Ampicillin-Sulbactam (UNASYN) 3 g in sodium chloride 0.9 % 100 mL IVPB        3 g 200 mL/hr over 30 Minutes Intravenous Every 6 hours 12/13/20 1528 12/20/20 1648   12/13/20 1400  ceFAZolin (ANCEF) IVPB 2g/100 mL premix  Status:  Discontinued        2 g 200 mL/hr over 30 Minutes Intravenous Every 8 hours 12/13/20 1050 12/13/20 1115   12/13/20 1215  ceFAZolin (ANCEF) IVPB 2g/100 mL premix        2 g 200 mL/hr over 30 Minutes Intravenous  Once 12/13/20 1115 12/13/20 1337   12/11/20 1000  cefTRIAXone (ROCEPHIN) 1 g in sodium chloride 0.9 % 100 mL IVPB  Status:  Discontinued        1 g 200 mL/hr over 30 Minutes Intravenous Every 24 hours 12/11/20 0852 12/13/20 1528   12/03/20 0800  cefTRIAXone (ROCEPHIN) 1 g in sodium chloride 0.9 % 100 mL IVPB  Status:  Discontinued        1 g 200 mL/hr over 30 Minutes Intravenous Every 24 hours 12/02/20 1007 12/04/20 1547   12/02/20 0815  cefTRIAXone (ROCEPHIN) 1 g in sodium chloride 0.9 % 100 mL IVPB        1 g 200 mL/hr over 30 Minutes Intravenous  Once 12/02/20 0804 12/02/20 0925         PAST MEDICAL HISTORY   Past Medical History:  Diagnosis Date  . Hypertension      SURGICAL HISTORY   Past Surgical History:  Procedure Laterality Date  . PEG PLACEMENT  N/A 12/13/2020   Procedure: PERCUTANEOUS ENDOSCOPIC  GASTROSTOMY (PEG) PLACEMENT;  Surgeon: Lesly Rubenstein, MD;  Location: [facility] ENDOSCOPY;  Service: Endoscopy;  Laterality: N/A;  . TRACHEOSTOMY TUBE PLACEMENT N/A 12/13/2020   Procedure: TRACHEOSTOMY;  Surgeon: Clyde Canterbury, MD;  Location: [facility] ORS;  Service: ENT;  Laterality: N/A;     FAMILY HISTORY   Family History  Problem Relation Age of Onset  . Hypertension Mother   . Leukemia Mother   . Diabetes Mother   . Healthy Father      SOCIAL HISTORY   Social History   Tobacco Use  . Smoking status: Former Smoker    Quit date: 2007    Years since quitting: 15.1  . Smokeless tobacco: Never Used  Vaping Use  . Vaping Use: Never used  Substance Use Topics  . Alcohol use: No  . Drug use: Never     MEDICATIONS   Current Medication:  Current Facility-Administered Medications:  .  0.9 %  sodium chloride infusion (Manually program via Guardrails IV Fluids), , Intravenous, Once, Rust-Chester, Britton L, NP .  0.9 %  sodium chloride infusion, 250 mL, Intravenous, Continuous, Darel Hong D, NP, Last Rate: 10 mL/hr at 12/19/20 0449, 250 mL at 12/19/20 0449 .  acetaminophen (TYLENOL) tablet 650 mg, 650 mg, Per Tube, Q8H PRN, Rust-Chester, Britton L, NP, 650 mg at 12/28/20 0107 .  alteplase (CATHFLO ACTIVASE) injection 2 mg, 2 mg, Intracatheter, Once, Kasa, Kurian, MD .  amLODipine (NORVASC) tablet 10 mg, 10 mg, Per Tube, Daily, Flora Lipps, MD, 10 mg at 12/28/20 0926 .  atorvastatin (LIPITOR) tablet 80 mg, 80 mg, Per Tube, Daily, Flora Lipps, MD, 80 mg at 12/28/20 0926 .  chlorhexidine gluconate (MEDLINE KIT) (PERIDEX) 0.12 % solution 15 mL, 15 mL, Mouth Rinse, BID, Kasa, Kurian, MD, 15 mL at 12/28/20 0845 .  Chlorhexidine Gluconate Cloth 2 % PADS 6 each, 6 each, Topical, Daily, Flora Lipps, MD, 6 each at 12/28/20 0920 .  clonazePAM (KLONOPIN) tablet 1 mg, 1 mg, Per Tube, BID, Nelle Don, MD, 1 mg at 12/28/20 0940 .  cloNIDine (CATAPRES) tablet 0.1 mg, 0.1 mg, Per Tube,  Q6H PRN, Nelle Don, MD, 0.1 mg at 12/25/20 2216 .  dexmedetomidine (PRECEDEX) 400 MCG/100ML (4 mcg/mL) infusion, 0.4-1.2 mcg/kg/hr, Intravenous, Titrated, Blakeney, Dreama Saa, NP, Last Rate: 8.77 mL/hr at 12/26/20 0025, 0.4 mcg/kg/hr at 12/26/20 0025 .  feeding supplement (KATE FARMS STANDARD 1.4) liquid 325 mL, 325 mL, Per Tube, QID, Kasa, Kurian, MD, 325 mL at 12/28/20 0926 .  free water 200 mL, 200 mL, Per Tube, QID, Kasa, Kurian, MD, 200 mL at 12/28/20 0800 .  guaiFENesin (ROBITUSSIN) 100 MG/5ML solution 200 mg, 200 mg, Per Tube, Q4H PRN, Rust-Chester, Britton L, NP, 200 mg at 12/25/20 2220 .  heparin injection 5,000 Units, 5,000 Units, Subcutaneous, Q8H, Kasa, Kurian, MD, 5,000 Units at 12/28/20 0510 .  hydrALAZINE (APRESOLINE) injection 10 mg, 10 mg, Intravenous, Q4H PRN, Nelle Don, MD, 10 mg at 12/25/20 0125 .  HYDROmorphone (DILAUDID) injection 1 mg, 1 mg, Intravenous, Q2H PRN, Nelle Don, MD, 1 mg at 12/26/20 1649 .  insulin aspart (novoLOG) injection 0-15 Units, 0-15 Units, Subcutaneous, Q4H, Flora Lipps, MD, 3 Units at 12/28/20 0025 .  levETIRAcetam (KEPPRA) 100 MG/ML solution 500 mg, 500 mg, Per Tube, BID, Benita Gutter, RPH, 500 mg at 12/28/20 0940 .  MEDLINE mouth rinse, 15 mL, Mouth Rinse, 10 times per day, Flora Lipps, MD, 15 mL at 12/28/20 0927 .  midazolam (VERSED) injection 2 mg, 2 mg, Intravenous, Q1H PRN, Rust-Chester, Britton L, NP, 2 mg at 12/26/20 0806 .  morphine 2 MG/ML injection 2 mg, 2 mg, Intravenous, Q2H PRN, Flora Lipps, MD, 2 mg at 12/21/20 2126 .  multivitamin with minerals tablet 1 tablet, 1 tablet, Per Tube, Daily, Flora Lipps, MD, 1 tablet at 12/28/20 0926 .  oxyCODONE (Oxy IR/ROXICODONE) immediate release tablet 10 mg, 10 mg, Per Tube, Q6H, Flora Lipps, MD, 10 mg at 12/28/20 0845 .  pantoprazole sodium (PROTONIX) 40 mg/20 mL oral suspension 40 mg, 40 mg, Per Tube, QHS, Flora Lipps, MD, 40 mg at 12/27/20 2109 .  polyethylene glycol (MIRALAX /  GLYCOLAX) packet 17 g, 17 g, Per Tube, Daily, Benita Gutter, RPH, 17 g at 12/26/20 6144 .  QUEtiapine (SEROQUEL) tablet 25 mg, 25 mg, Per Tube, QHS, Flora Lipps, MD, 25 mg at 12/27/20 2109 .  senna-docusate (Senokot-S) tablet 1 tablet, 1 tablet, Per Tube, Daily, Benita Gutter, RPH, 1 tablet at 12/26/20 3154 .  sodium chloride flush (NS) 0.9 % injection 10-40 mL, 10-40 mL, Intracatheter, Q12H, Freddi Starr, MD, 10 mL at 12/28/20 0928 .  sodium chloride flush (NS) 0.9 % injection 10-40 mL, 10-40 mL, Intracatheter, PRN, Freddi Starr, MD, 10 mL at 12/26/20 2150    ALLERGIES   Patient has no known allergies.    REVIEW OF SYSTEMS    Unable to obtain due to anoxic brain inury  PHYSICAL EXAMINATION   Vital Signs: Temp:  [98.7 F (37.1 C)-100.8 F (38.2 C)] 100.8 F (38.2 C) (02/05 0025) Pulse Rate:  [69-111] 72 (02/05 0700) Resp:  [16-35] 16 (02/05 0700) BP: (99-179)/(60-111) 102/66 (02/05 0700) SpO2:  [94 %-100 %] 99 % (02/05 0851) FiO2 (%):  [28 %] 28 % (02/05 0851) Weight:  [85 kg] 85 kg (02/05 0500)    GENERAL ill appearing,  EYES: Pupils equal, round, reactive to light.  No scleral icterus.  NECK:s/p trach PULMONARY: ctab with minimal rhonchi today CARDIOVASCULAR: S1 and S2. Regular rate and rhythm. No murmurs, rubs, or gallops.  GASTROINTESTINAL: Soft, nontender, -distended.  Positive bowel sounds.   MUSCULOSKELETAL: No swelling, clubbing, or edema.  NEUROLOGIC: more alert and awake SKIN:intact,warm,dry   PERTINENT DATA     Infusions: . sodium chloride 250 mL (12/19/20 0449)  . dexmedetomidine (PRECEDEX) IV infusion 0.4 mcg/kg/hr (12/26/20 0025)   Scheduled Medications: . sodium chloride   Intravenous Once  . alteplase  2 mg Intracatheter Once  . amLODipine  10 mg Per Tube Daily  . atorvastatin  80 mg Per Tube Daily  . chlorhexidine gluconate (MEDLINE KIT)  15 mL Mouth Rinse BID  . Chlorhexidine Gluconate Cloth  6 each Topical Daily  .  clonazePAM  1 mg Per Tube BID  . feeding supplement (KATE FARMS STANDARD 1.4)  325 mL Per Tube QID  . free water  200 mL Per Tube QID  . heparin  5,000 Units Subcutaneous Q8H  . insulin aspart  0-15 Units Subcutaneous Q4H  . levETIRAcetam  500 mg Per Tube BID  . mouth rinse  15 mL Mouth Rinse 10 times per day  . multivitamin with minerals  1 tablet Per Tube Daily  . oxyCODONE  10 mg Per Tube Q6H  . pantoprazole sodium  40 mg Per Tube QHS  . polyethylene glycol  17 g Per Tube Daily  . QUEtiapine  25 mg Per Tube QHS  . senna-docusate  1 tablet Per Tube Daily  . sodium  chloride flush  10-40 mL Intracatheter Q12H   PRN Medications: acetaminophen, cloNIDine, guaiFENesin, hydrALAZINE, HYDROmorphone (DILAUDID) injection, midazolam, morphine injection, sodium chloride flush Hemodynamic parameters:   Intake/Output: 02/04 0701 - 02/05 0700 In: 10 [I.V.:10] Out: 0   Ventilator  Settings: FiO2 (%):  [28 %] 28 %    LAB RESULTS:  Basic Metabolic Panel: Recent Labs  Lab 12/24/20 0345 12/25/20 0432 12/26/20 0619 12/27/20 0500 12/28/20 0645  NA 143 143 141 140 143  K 4.2 3.6 3.8 3.7 4.3  CL 104 105 104 104 104  CO2 25 26 27 26 28   GLUCOSE 139* 164* 149* 131* 127*  BUN 26* 22* 24* 20 23*  CREATININE 0.76 0.54 0.64 0.49 0.50  CALCIUM 8.8* 8.9 9.2 9.5 9.6  MG 1.9 1.9 2.2 1.9 2.0  PHOS 4.5 3.3 4.6 3.6 4.9*   Liver Function Tests: Recent Labs  Lab 12/24/20 0345 12/25/20 0432 12/26/20 0619 12/27/20 0500 12/28/20 0645  ALBUMIN 2.3* 2.6* 2.6* 3.0* 2.7*   No results for input(s): LIPASE, AMYLASE in the last 168 hours. No results for input(s): AMMONIA in the last 168 hours. CBC: Recent Labs  Lab 12/24/20 0345 12/25/20 0432 12/26/20 0619 12/27/20 0500 12/28/20 0645  WBC 6.9 8.3 7.3 8.7 8.7  NEUTROABS 4.8 6.4 5.0 5.7 5.8  HGB 7.9* 9.5* 8.7* 9.5* 9.6*  HCT 26.6* 30.5* 28.5* 32.1* 32.3*  MCV 84.7 83.8 86.1 85.1 85.2  PLT 352 376 377 392 343   Cardiac Enzymes: No results  for input(s): CKTOTAL, CKMB, CKMBINDEX, TROPONINI in the last 168 hours. BNP: Invalid input(s): POCBNP CBG: Recent Labs  Lab 12/27/20 1551 12/27/20 1936 12/27/20 2338 12/28/20 0423 12/28/20 0734  GLUCAP 120* 123* 165* 109* 114*       IMAGING RESULTS:  Imaging: No results found. @PROBHOSP @ No results found.   ASSESSMENT AND PLAN      55 y.o.obese F admiktted for acute cardiac arrest due to Severe acidosis with DKAwith hx of HTN p/w myoclonic jerking and unresponsiveness following cardiac arrest with unknown down time, initial rhythm of Vfib, prolonged ACLS before ROSC, now s/p hypothermia protocol. MRI Brain consistent with diffuse hypoxic/anoxic injury- brain damage s/p trach and PEG    Acute anoxic brain Injury    - Due to cardiac arrest   -s/p neurology evaluation - appreciate input    - s/p serial brain imaging with most recent as below                      IMPRESSION: Findings consistent with anoxic brain injury. 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 Evolutionary changes likely related to involving ischemia compared with the prior MRI of 12/07/2020. Diffusion-weighted signal is less apparent on the current study compared to the prior study. Correlate with neurologic exam.          -currently we are working with [facility] for placement, there were some challenges with insurance coverage and seems patient needs to be off ventilator to have CIR placement per social worker documentation.     Acute cardiac arrest  -  S/p ACLS with ROSC -s/p TTM  DKA -s/p full scope of treatment     - resolved DKA   Severe ACUTE Hypoxic and Hypercapnic Respiratory Failure    - due to cardiac arrest    - s/p tracheostomy    - for LTACH placement -continue Bronchodilator Therapy -Wean Fio2 and PEEP as tolerated -will perform SAT/SBT when respiratory parameters  are met -VAP/VENT bundle implementation TCT as  tolerated     ID -continue IV abx as prescibed -follow up cultures  GI/Nutrition GI PROPHYLAXIS as indicated DIET-->TF's as tolerated Constipation protocol as indicated  ENDO - ICU hypoglycemic\Hyperglycemia protocol -check FSBS per protocol   ELECTROLYTES -follow labs as needed -replace as needed -pharmacy consultation   DVT/GI PRX ordered -SCDs  TRANSFUSIONS AS NEEDED MONITOR FSBS ASSESS the need for LABS as needed   Critical care provider statement:    Critical care time (minutes):  33   Critical care time was exclusive of:  Separately billable procedures and treating other patients   Critical care was necessary to treat or prevent imminent or life-threatening deterioration of the following conditions:  cardiac arrest, anoxic brain injury, multiple comorbid conditions   Critical care was time spent personally by me on the following activities:  Development of treatment plan with patient or surrogate, discussions with consultants, evaluation of patient's response to treatment, examination of patient, obtaining history from patient or surrogate, ordering and performing treatments and interventions, ordering and review of laboratory studies and re-evaluation of patient's condition.  I assumed direction of critical care for this patient from another provider in my specialty: no    This document was prepared using Dragon voice recognition software and may include unintentional dictation errors.    Ottie Glazier, M.D.  Division of [facility]

## 2020-12-27 NOTE — Progress Notes (Signed)
Physical Therapy Treatment Patient Details Name: [name] [name] MRN: [id number] DOB: 07/02/1966 Today's Date: 12/27/2020    History of Present Illness Pt is a 55 year old female presented to the ED via EMS post witnessed cardiac arrest. ROSC achieved following 3 defibrillations and one round of epinephrine. Patient found to have significant hypokalemia on arrival. Given witnessed arrest, patient Code ICE.  S/p trach and peg tube placement.  Workup showed anoxic brain injury. Pt also showed facial twitching that could be myoclonic versus partial seizure.    PT Comments    Patient in bed, family at bedside. Pt scooted down in bed, legs pulled up into a bent position, able to track PT/OT around the room, and look at therapist when directed. Session focused on mobility with PT/OT treat to maximize safety, mobility, and function. Pt on trach collar 5L FiO2 28% throughout session, pt with intermittent coughing and secretions, addressed as able. Family present throughout and highly involved with pt and encouragement. Pt performed supine <> sit with max A of 2 to EOB with pt needing min A - max A for sitting balance, varying levels of assist due to pt fatigue and activities performed. Sit <> stand with bilateral knees blocked as well as assistance at hips and shoulders maxAx2. Pt unable to come fully upright. Returned to sitting at EOB for balance activites as able. Pt able to prop with assist and weight bear through R elbow for several minutes. Pt returned to supine with max Ax2 and repositioned. OT at bedside for continued treatment at PT exit. The patient would benefit from further skilled PT intervention to continue to progress towards goals. Recommendation remains appropriate.      Follow Up Recommendations  CIR     Equipment Recommendations  Other (comment) (TBD)    Recommendations for Other Services OT consult;Rehab consult     Precautions / Restrictions Precautions Precautions:  Fall Precaution Comments: trach collar 5 L FiO2 28% Restrictions Weight Bearing Restrictions: No    Mobility  Bed Mobility Overal bed mobility: Needs Assistance Bed Mobility: Supine to Sit;Sit to Supine     Supine to sit: Max assist;+2 for physical assistance;HOB elevated Sit to supine: Max assist;+2 for physical assistance      Transfers Overall transfer level: Needs assistance Equipment used: 2 person hand held assist Transfers: Sit to/from Stand Sit to Stand: Max assist;+2 physical assistance;+2 safety/equipment         General transfer comment: three musketeer style standing from bed. Pt unable to bring trunk fully upright. No steps taken secondary to safety.  Ambulation/Gait             General Gait Details: unable   Stairs             Wheelchair Mobility    Modified Rankin (Stroke Patients Only)       Balance Overall balance assessment: Needs assistance   Sitting balance-Leahy Scale: Poor Sitting balance - Comments: varying levels of seated balance throughout ranging from min guard - mod A Postural control: Left lateral lean   Standing balance-Leahy Scale: Zero                              Cognition Arousal/Alertness: Awake/alert;Lethargic Behavior During Therapy: Flat affect Overall Cognitive Status: Difficult to assess  General Comments: pt non verbal, pt is cooperative      Exercises Other Exercises Other Exercises: PROM UEs shoulder flexion/elbow flexion x5 bilaterally. Noted new wrist splints in place. Other Exercises: pt able to perform heel slides bilaterally AROM-AAROM x10 with constant tactile and verbal cueing, x10 hip abduction/adduction x10 PROM, x10 SAQ AAROM-PROM x10 on ea    General Comments        Pertinent Vitals/Pain Pain Assessment: Faces Faces Pain Scale: No hurt    Home Living Family/patient expects to be discharged to:: Inpatient rehab Living  Arrangements: Spouse/significant other;Children Available Help at Discharge: Family;Available 24 hours/day Type of Home: House              Prior Function            PT Goals (current goals can now be found in the care plan section) Acute Rehab PT Goals Patient Stated Goal: to improve as much as possible Progress towards PT goals: Progressing toward goals    Frequency    Min 2X/week      PT Plan Current plan remains appropriate    Co-evaluation PT/OT/SLP Co-Evaluation/Treatment: Yes Reason for Co-Treatment: Complexity of the patient's impairments (multi-system involvement);Necessary to address cognition/behavior during functional activity;For patient/therapist safety;To address functional/ADL transfers PT goals addressed during session: Mobility/safety with mobility;Balance;Strengthening/ROM OT goals addressed during session: ADL's and self-care;Proper use of Adaptive equipment and DME;Strengthening/ROM      AM-PAC PT "6 Clicks" Mobility   Outcome Measure  Help needed turning from your back to your side while in a flat bed without using bedrails?: Total Help needed moving from lying on your back to sitting on the side of a flat bed without using bedrails?: Total Help needed moving to and from a bed to a chair (including a wheelchair)?: Total Help needed standing up from a chair using your arms (e.g., wheelchair or bedside chair)?: Total Help needed to walk in hospital room?: Total Help needed climbing 3-5 steps with a railing? : Total 6 Click Score: 6    End of Session Equipment Utilized During Treatment: Oxygen Activity Tolerance: Patient tolerated treatment well Patient left: in bed;with call bell/phone within reach;Other (comment) (OT at bedside) Nurse Communication: Mobility status PT Visit Diagnosis: Other abnormalities of gait and mobility (R26.89);Muscle weakness (generalized) (M62.81);Difficulty in walking, not elsewhere classified (R26.2);Other symptoms  and signs involving the nervous system (O27.741)     Time: 1345-1419 PT Time Calculation (min) (ACUTE ONLY): 34 min  Charges:  $Therapeutic Exercise: 8-22 mins                     Lieutenant Diego PT, DPT 4:26 PM,12/27/20

## 2020-12-27 NOTE — Progress Notes (Signed)
Pt doing well on 28% ATC. O2 sat 98%. No distress apparent.

## 2020-12-27 NOTE — Progress Notes (Signed)
Occupational Therapy Treatment Patient Details Name: [name] [name] MRN: [id number] DOB: 08-24-1966 Today's Date: 12/27/2020    History of present illness Pt is a 55 year old female presented to the ED via EMS post witnessed cardiac arrest. ROSC achieved following 3 defibrillations and one round of epinephrine. Patient found to have significant hypokalemia on arrival. Given witnessed arrest, patient Code ICE.  S/p trach and peg tube placement.  Workup showed anoxic brain injury. Pt also showed facial twitching that could be myoclonic versus partial seizure.   OT comments  Pt seen for skilled co-tx with PT 1017-5102 and OT session continues from 1419- 5852. Upon entering the room, pt supine in bed with husband present in room. Pt on trach collar 5L FiO2 28%. RN present and removed rectal tube. Pt needing max A of 2 to EOB with pt needing min A - max A for sitting balance depending on complexity of task and pt's level of fatigue. Pt standing with max- total A of 2 three musketeer style with B knees blocked. Pt unable to get trunk into full upright position. Pt returning to seated position with focus on attempting to initiate lateral lean and weight bearing through B UEs. Pt returning to bed with max A and repositioned. OT then educating and demonstrating B UE PROM exercises with caregiver returning demonstrations correctly and safely. OT encouraged caregiver to perform these. Pt with B wrist splints donned this session, they were removed to check skin and recommendation made for pt to wear these at night only. OT discussed this with spouse and RN for wearing schedule. OT educating caregiver on importance on creating calm quiet environment for pt to rest secondary to fatigue with tasks and how important this is to perform. OT also encouraged caregiver to have pt attempt to track and locate him at various places in room during the day. Pt does blow kiss to husband at end of session. Pt continues to benefit  from OT intervention. Pt has not been on vent since 2/3 at 0800 and has been on tach collar only and tolerating well. Pt participating in 2 therapy sessions today with this session lasting 62 minutes. Pt showing great effort and would be good candidate for intensive therapeutic intervention.    Follow Up Recommendations  CIR    Equipment Recommendations  Other (comment) (defer to next venue of care)       Precautions / Restrictions Precautions Precautions: Fall Precaution Comments: trach collar 5 L FiO2 28% Restrictions Weight Bearing Restrictions: No       Mobility Bed Mobility Overal bed mobility: Needs Assistance Bed Mobility: Supine to Sit;Sit to Supine     Supine to sit: Max assist;+2 for physical assistance;HOB elevated Sit to supine: Max assist;+2 for physical assistance      Transfers Overall transfer level: Needs assistance Equipment used: 2 person hand held assist Transfers: Sit to/from Stand Sit to Stand: Max assist;+2 physical assistance;+2 safety/equipment         General transfer comment: three musketeer style standing from bed. Pt unable to bring trunk fully upright. No steps taken secondary to safety.    Balance Overall balance assessment: Needs assistance   Sitting balance-Leahy Scale: Poor Sitting balance - Comments: varying levels of seated balance throughout ranging from min guard - mod A Postural control: Left lateral lean   Standing balance-Leahy Scale: Zero  ADL either performed or assessed with clinical judgement   ADL Overall ADL's : Needs assistance/impaired     Grooming: Bed level;Total assistance;Wash/dry face Grooming Details (indicate cue type and reason): hand over hand assistance for tasks                                     Vision Patient Visual Report: No change from baseline            Cognition Arousal/Alertness: Awake/alert;Lethargic Behavior During Therapy:  Flat affect Overall Cognitive Status: Difficult to assess                                 General Comments: pt non verbal, but did shake her head yes to one question. Pt is cooperative.        Exercises Other Exercises Other Exercises: PROM UEs shoulder flexion/elbow flexion x5 bilaterally. Noted new wrist splints in place. Other Exercises: pt able to perform heel slides bilaterally AROM-AAROM x10 with constant tactile and verbal cueing, x10 hip abduction/adduction x10 PROM, x10 SAQ AAROM-PROM x10 on ea           Pertinent Vitals/ Pain       Pain Assessment: Faces Faces Pain Scale: No hurt  Home Living Family/patient expects to be discharged to:: Inpatient rehab Living Arrangements: Spouse/significant other;Children Available Help at Discharge: Family;Available 24 hours/day Type of Home: House                                      Frequency  Min 3X/week        Progress Toward Goals  OT Goals(current goals can now be found in the care plan section)  Progress towards OT goals: Progressing toward goals  Acute Rehab OT Goals Patient Stated Goal: to improve as much as possible OT Goal Formulation: With family Time For Goal Achievement: 01/09/21 Potential to Achieve Goals: [place] Discharge plan remains appropriate    Co-evaluation    PT/OT/SLP Co-Evaluation/Treatment: Yes Reason for Co-Treatment: Complexity of the patient's impairments (multi-system involvement);For patient/therapist safety;To address functional/ADL transfers PT goals addressed during session: Mobility/safety with mobility;Balance;Proper use of DME;Strengthening/ROM OT goals addressed during session: ADL's and self-care;Proper use of Adaptive equipment and DME;Strengthening/ROM      AM-PAC OT "6 Clicks" Daily Activity     Outcome Measure   Help from another person eating meals?: Total Help from another person taking care of personal grooming?: Total Help from  another person toileting, which includes using toliet, bedpan, or urinal?: Total Help from another person bathing (including washing, rinsing, drying)?: Total Help from another person to put on and taking off regular upper body clothing?: Total Help from another person to put on and taking off regular lower body clothing?: Total 6 Click Score: 6    End of Session Equipment Utilized During Treatment: Oxygen  OT Visit Diagnosis: Unsteadiness on feet (R26.81);Muscle weakness (generalized) (M62.81)   Activity Tolerance Patient tolerated treatment well   Patient Left in bed;with call bell/phone within reach;with bed alarm set;with family/visitor present   Nurse Communication Mobility status        Time: 1345-1446 OT Time Calculation (min): 61 min  Charges: OT General Charges $OT Visit: 1 Visit OT Treatments $Neuromuscular Re-education: 38-52 mins  Darleen Crocker, MS, OTR/L ,  CBIS ascom [phone]  12/27/20, 3:57 PM

## 2020-12-27 NOTE — TOC Progression Note (Signed)
Transition of Care [facility]) - Progression Note    Patient Details  Name: SYBIL SHRADER MRN: [id number] Date of Birth: [birth year]  Transition of Care [facility]) CM/SW [place], [place] Phone Number: [phone] 12/27/2020, 3:37 PM  Clinical Narrative:     PT/OT recommend CIR. Caitlyn RN CIR has confirmed referral receipt.  Patient on PRN vent as of today, CIR cannot accept vent patients, will continue to follow.   Expected Discharge Plan: Long Term Nursing Home Barriers to Discharge: Inadequate or no insurance,Continued Medical Work up,Financial Resources  Expected Discharge Plan and Services Expected Discharge Plan: Long Term Nursing Home In-house Referral: Clinical Social Work   Post Acute Care Choice: Nursing Home Living arrangements for the past 2 months: Single Family Home                                       Social Determinants of Health (SDOH) Interventions    Readmission Risk Interventions No flowsheet data found.

## 2020-12-27 NOTE — Progress Notes (Signed)
Physical Therapy Treatment Patient Details Name: [name] [name] MRN: [id number] DOB: 09-26-1966 Today's Date: 12/27/2020    History of Present Illness Pt is a 55 year old female presented to the ED via EMS post witnessed cardiac arrest. ROSC achieved following 3 defibrillations and one round of epinephrine. Patient found to have significant hypokalemia on arrival. Given witnessed arrest, patient Code ICE.  S/p trach and peg tube placement.  Workup showed anoxic brain injury. Pt also showed facial twitching that could be myoclonic versus partial seizure.    PT Comments    Patient alert, able to follow PTs commands with extended time (able to look at PT on command and participate with LEs exercises). Constant tactile and verbal cueing to maximize pt participation and muscle activation during supine exercises. Improved RLE movement and muscle activation today compared to previous session.  EOB and OOB activities to be attempted this PM with OT as a co-treat to maximize pt/therapist safety and mobility.    Follow Up Recommendations  CIR     Equipment Recommendations  Other (comment) (TBD)    Recommendations for Other Services OT consult;Rehab consult     Precautions / Restrictions Precautions Precautions: Fall Precaution Comments: trach collar Restrictions Weight Bearing Restrictions: No    Mobility  Bed Mobility                  Transfers                    Ambulation/Gait                 Stairs             Wheelchair Mobility    Modified Rankin (Stroke Patients Only)       Balance                                            Cognition Arousal/Alertness: Awake/alert;Lethargic Behavior During Therapy: Flat affect Overall Cognitive Status: Difficult to assess                                        Exercises Other Exercises Other Exercises: PROM UEs shoulder flexion/elbow flexion x5 bilaterally. Noted  new wrist splints in place. Other Exercises: pt able to perform heel slides bilaterally AROM-AAROM x10 with constant tactile and verbal cueing, x10 hip abduction/adduction x10 PROM, x10 SAQ AAROM-PROM x10 on ea    General Comments        Pertinent Vitals/Pain Pain Assessment: Faces Faces Pain Scale: No hurt    Home Living                      Prior Function            PT Goals (current goals can now be found in the care plan section) Progress towards PT goals: Progressing toward goals    Frequency    Min 2X/week      PT Plan Current plan remains appropriate    Co-evaluation              AM-PAC PT "6 Clicks" Mobility   Outcome Measure  Help needed turning from your back to your side while in a flat bed without using bedrails?: A Lot Help needed moving from lying on  your back to sitting on the side of a flat bed without using bedrails?: A Lot Help needed moving to and from a bed to a chair (including a wheelchair)?: Total Help needed standing up from a chair using your arms (e.g., wheelchair or bedside chair)?: Total Help needed to walk in hospital room?: Total Help needed climbing 3-5 steps with a railing? : Total 6 Click Score: 8    End of Session Equipment Utilized During Treatment: Oxygen Activity Tolerance: Patient tolerated treatment well Patient left: in bed;with call bell/phone within reach Nurse Communication: Mobility status PT Visit Diagnosis: Other abnormalities of gait and mobility (R26.89);Muscle weakness (generalized) (M62.81);Difficulty in walking, not elsewhere classified (R26.2);Other symptoms and signs involving the nervous system (W44.628)     Time: 6381-7711 PT Time Calculation (min) (ACUTE ONLY): 17 min  Charges:  $Therapeutic Exercise: 8-22 mins                     Lieutenant Diego PT, DPT 1:25 PM,12/27/20

## 2020-12-28 LAB — RENAL FUNCTION PANEL
Albumin: 2.7 g/dL — ABNORMAL LOW (ref 3.5–5.0)
Anion gap: 11 (ref 5–15)
BUN: 23 mg/dL — ABNORMAL HIGH (ref 6–20)
CO2: 28 mmol/L (ref 22–32)
Calcium: 9.6 mg/dL (ref 8.9–10.3)
Chloride: 104 mmol/L (ref 98–111)
Creatinine, Ser: 0.5 mg/dL (ref 0.44–1.00)
GFR, Estimated: >60 mL/min (ref >60)
Glucose, Bld: 127 mg/dL — ABNORMAL HIGH (ref 70–99)
Phosphorus: 4.9 mg/dL — ABNORMAL HIGH (ref 2.5–4.6)
Potassium: 4.3 mmol/L (ref 3.5–5.1)
Sodium: 143 mmol/L (ref 135–145)

## 2020-12-28 LAB — CBC WITH DIFFERENTIAL/PLATELET
Abs Immature Granulocytes: 0.02 10*3/uL (ref 0.00–0.07)
Basophils Absolute: 0 10*3/uL (ref 0.0–0.1)
Basophils Relative: 0 %
Eosinophils Absolute: 0.4 10*3/uL (ref 0.0–0.5)
Eosinophils Relative: 5 %
HCT: 32.3 % — ABNORMAL LOW (ref 36.0–46.0)
Hemoglobin: 9.6 g/dL — ABNORMAL LOW (ref 12.0–15.0)
Immature Granulocytes: 0 %
Lymphocytes Relative: 21 %
Lymphs Abs: 1.8 10*3/uL (ref 0.7–4.0)
MCH: 25.3 pg — ABNORMAL LOW (ref 26.0–34.0)
MCHC: 29.7 g/dL — ABNORMAL LOW (ref 30.0–36.0)
MCV: 85.2 fL (ref 80.0–100.0)
Monocytes Absolute: 0.6 10*3/uL (ref 0.1–1.0)
Monocytes Relative: 7 %
Neutro Abs: 5.8 10*3/uL (ref 1.7–7.7)
Neutrophils Relative %: 67 %
Platelets: 343 10*3/uL (ref 150–400)
RBC: 3.79 MIL/uL — ABNORMAL LOW (ref 3.87–5.11)
RDW: 19.8 % — ABNORMAL HIGH (ref 11.5–15.5)
WBC: 8.7 10*3/uL (ref 4.0–10.5)
nRBC: 0 % (ref 0.0–0.2)

## 2020-12-28 LAB — GLUCOSE, CAPILLARY
Glucose-Capillary: 109 mg/dL — ABNORMAL HIGH (ref 70–99)
Glucose-Capillary: 114 mg/dL — ABNORMAL HIGH (ref 70–99)
Glucose-Capillary: 183 mg/dL — ABNORMAL HIGH (ref 70–99)
Glucose-Capillary: 140 mg/dL — ABNORMAL HIGH (ref 70–99)
Glucose-Capillary: 121 mg/dL — ABNORMAL HIGH (ref 70–99)
Glucose-Capillary: 178 mg/dL — ABNORMAL HIGH (ref 70–99)

## 2020-12-28 LAB — MAGNESIUM: Magnesium: 2 mg/dL (ref 1.7–2.4)

## 2020-12-28 MED ORDER — ALTEPLASE 2 MG IJ SOLR
2.0000 mg | Freq: Once | INTRAMUSCULAR | Status: AC
  Administered 2020-12-28: 2 mg
  Filled 2020-12-28: qty 2

## 2020-12-28 MED ORDER — LEVETIRACETAM IN NACL 1000 MG/100ML IV SOLN
1000.0000 mg | Freq: Once | INTRAVENOUS | Status: AC
  Administered 2020-12-28: 1000 mg via INTRAVENOUS
  Filled 2020-12-28: qty 100

## 2020-12-28 MED ORDER — STERILE WATER FOR INJECTION IJ SOLN
INTRAMUSCULAR | Status: AC
  Administered 2020-12-28: 20 mL
  Filled 2020-12-28: qty 10

## 2020-12-28 MED ORDER — LEVETIRACETAM 100 MG/ML PO SOLN
1000.0000 mg | Freq: Two times a day (BID) | ORAL | Status: DC
  Administered 2020-12-28 – 2021-01-10 (×26): 1000 mg
  Filled 2020-12-28 (×31): qty 10

## 2020-12-28 NOTE — Progress Notes (Signed)
[place] for Electrolyte Monitoring and Replacement   Recent Labs: Potassium (mmol/L)  Date Value  12/28/2020 4.3  02/20/2013 3.4 (L)   Magnesium (mg/dL)  Date Value  12/28/2020 2.0   Calcium (mg/dL)  Date Value  12/28/2020 9.6   Calcium, Total (mg/dL)  Date Value  02/20/2013 8.7   Albumin (g/dL)  Date Value  12/28/2020 2.7 (L)  02/20/2013 3.2 (L)   Phosphorus (mg/dL)  Date Value  12/28/2020 4.9 (H)   Sodium (mmol/L)  Date Value  12/28/2020 143  02/20/2013 137    Assessment: 55 year old female presented to the ED via EMS post witnessed cardiac arrest. ROSC achieved following 3 defibrillations and one round of epinephrine. Patient found to have significant hypokalemia on arrival. Given witnessed arrest, patient Code ICE. Now s/p tracheostomy and PEG. Pharmacy to manage electrolytes.   Nutrition: ProSource BID + Vital 40 mL/hr  Goal of Therapy:  Potassium 4.0 - 5.1 mmol/L Magnesium 2.1 - 2.4 mg/dL All Other Electrolytes WNL  Plan:   Na 140, continue free water flushes 200 mL q4h (1.2 L/day)  No replacement needed today.   Follow up with morning labs  Oswald Hillock, PharmD 12/28/2020 7:27 AM

## 2020-12-28 NOTE — Progress Notes (Signed)
Subjective: I was called to revisit the patient due to interval worsening.  Speaking with the nurse, she apparently has a waxing/waning mentation  Exam: Vitals:   12/28/20 1300 12/28/20 1400  BP: 117/72 117/72  Pulse: 77 80  Resp: 17 19  Temp:    SpO2: 98% 99%   Gen: In bed, NAD Resp: non-labored breathing, no acute distress Abd: soft, nt  Neuro: MS: awake, but she does not follow commands JJ:[id number] vision bilaterally, pupils equal round reactive, with grimace she has clear weakness of the right face Motor: She has bilateral weakness, but appears weaker on right than left.  Sensory: She endorses sensation bilaterally  Pertinent results: MRI shows expected evolution of her previously seen anoxic injury.  Impression: 55 yo  F s/p cardiac arrest on 1/10 s/p hypothermia. She has significant changes on MRI after cardiac arrest but now following commands. She had an episode of facial twitching and keppra has been started.  With her waxing and waning described by the nurse, I would be concerned for possible intermittent seizures and I would favor increasing her Keppra.  With her exam seeming more asymmetric to me than when I previously examined her, I would like to repeat imaging with head CT as well.   Recommendations: 1) increase Keppra to 1 g twice daily 2) repeat head CT  3) I will continue to follow  Roland Rack, MD [facility] [phone]  If 7pm- 7am, please page neurology on call as listed in [place].

## 2020-12-29 LAB — GLUCOSE, CAPILLARY
Glucose-Capillary: 148 mg/dL — ABNORMAL HIGH (ref 70–99)
Glucose-Capillary: 114 mg/dL — ABNORMAL HIGH (ref 70–99)
Glucose-Capillary: 113 mg/dL — ABNORMAL HIGH (ref 70–99)
Glucose-Capillary: 133 mg/dL — ABNORMAL HIGH (ref 70–99)
Glucose-Capillary: 105 mg/dL — ABNORMAL HIGH (ref 70–99)
Glucose-Capillary: 162 mg/dL — ABNORMAL HIGH (ref 70–99)

## 2020-12-29 LAB — CBC WITH DIFFERENTIAL/PLATELET
Abs Immature Granulocytes: 0.03 10*3/uL (ref 0.00–0.07)
Basophils Absolute: 0 10*3/uL (ref 0.0–0.1)
Basophils Relative: 0 %
Eosinophils Absolute: 0.4 10*3/uL (ref 0.0–0.5)
Eosinophils Relative: 4 %
HCT: 32.1 % — ABNORMAL LOW (ref 36.0–46.0)
Hemoglobin: 9.7 g/dL — ABNORMAL LOW (ref 12.0–15.0)
Immature Granulocytes: 0 %
Lymphocytes Relative: 19 %
Lymphs Abs: 1.7 10*3/uL (ref 0.7–4.0)
MCH: 25.9 pg — ABNORMAL LOW (ref 26.0–34.0)
MCHC: 30.2 g/dL (ref 30.0–36.0)
MCV: 85.6 fL (ref 80.0–100.0)
Monocytes Absolute: 0.7 10*3/uL (ref 0.1–1.0)
Monocytes Relative: 8 %
Neutro Abs: 6 10*3/uL (ref 1.7–7.7)
Neutrophils Relative %: 69 %
Platelets: 333 10*3/uL (ref 150–400)
RBC: 3.75 MIL/uL — ABNORMAL LOW (ref 3.87–5.11)
RDW: 19.5 % — ABNORMAL HIGH (ref 11.5–15.5)
WBC: 8.8 10*3/uL (ref 4.0–10.5)
nRBC: 0 % (ref 0.0–0.2)

## 2020-12-29 LAB — RENAL FUNCTION PANEL
Albumin: 2.8 g/dL — ABNORMAL LOW (ref 3.5–5.0)
Anion gap: 9 (ref 5–15)
BUN: 19 mg/dL (ref 6–20)
CO2: 28 mmol/L (ref 22–32)
Calcium: 9.3 mg/dL (ref 8.9–10.3)
Chloride: 103 mmol/L (ref 98–111)
Creatinine, Ser: 0.49 mg/dL (ref 0.44–1.00)
GFR, Estimated: >60 mL/min (ref >60)
Glucose, Bld: 157 mg/dL — ABNORMAL HIGH (ref 70–99)
Phosphorus: 3.8 mg/dL (ref 2.5–4.6)
Potassium: 4.2 mmol/L (ref 3.5–5.1)
Sodium: 140 mmol/L (ref 135–145)

## 2020-12-29 LAB — MAGNESIUM: Magnesium: 1.8 mg/dL (ref 1.7–2.4)

## 2020-12-29 NOTE — Progress Notes (Signed)
[place] for Electrolyte Monitoring and Replacement   Recent Labs: Potassium (mmol/L)  Date Value  12/29/2020 4.2  02/20/2013 3.4 (L)   Magnesium (mg/dL)  Date Value  12/29/2020 1.8   Calcium (mg/dL)  Date Value  12/29/2020 9.3   Calcium, Total (mg/dL)  Date Value  02/20/2013 8.7   Albumin (g/dL)  Date Value  12/29/2020 2.8 (L)  02/20/2013 3.2 (L)   Phosphorus (mg/dL)  Date Value  12/29/2020 3.8   Sodium (mmol/L)  Date Value  12/29/2020 140  02/20/2013 137    Assessment: 55 year old female presented to the ED via EMS post witnessed cardiac arrest. ROSC achieved following 3 defibrillations and one round of epinephrine. Patient found to have significant hypokalemia on arrival. Given witnessed arrest, patient Code ICE. Now s/p tracheostomy and PEG. Pharmacy to manage electrolytes.   Nutrition: ProSource BID + Vital 40 mL/hr  Goal of Therapy:  Potassium 4.0 - 5.1 mmol/L Magnesium 2.1 - 2.4 mg/dL All Other Electrolytes WNL  Plan:   Na 140, continue free water flushes 200 mL q4h (1.2 L/day)  No replacement needed today.   Follow up with morning labs  Oswald Hillock, PharmD 12/29/2020 7:45 AM

## 2020-12-29 NOTE — Progress Notes (Signed)
Subjective: Better today.   Exam: Vitals:   12/29/20 0500 12/29/20 0600  BP: 133/77 125/72  Pulse: 79 87  Resp: 17 (!) 29  Temp:    SpO2: 99% 98%   Gen: In bed, intubated Resp: non-labored breathing, no acute distress Abd: soft, nt  Neuro: MS: awake, follows commands slowly  NO:[id number] vision bilaterally, pupils equal round reactive. Motor: She has bilateral weakness, less asymmetric than yesterday Sensory: She endorses sensation bilaterally  Pertinent results: CT - pending.   Impression: 55 yo  F s/p cardiac arrest on 1/10 s/p hypothermia. She has significant changes on MRI after cardiac arrest but now following commands. She had an episode of facial twitching and keppra has been started.  With her waxing and waning described by the nurse, I was concerned for seizures, and have increased keppra. She appears to be improved. If she has any further waxing/waning, may need to consider transfer for continuous EEG.    Recommendations: 1) continue Keppra 1 g twice daily 2) repeat head CT pending 3) will continue to follow  Roland Rack, MD [facility] [phone]  If 7pm- 7am, please page neurology on call as listed in [place].

## 2020-12-29 NOTE — Progress Notes (Signed)
CRITICAL CARE PROGRESS NOTE    Name: [name] [name] MRN: [id number] DOB: 05/08/1966     LOS: 38   SUBJECTIVE FINDINGS & SIGNIFICANT EVENTS    Patient description:    [name] with acute cardiac arrest Labs c/w severe acidosis and severe DKA Patient with witnessed V fib arrest and shocked in the field ROSC returned patient admitted to MICU    Subjective findings:   1/10 admitted to ICU for cardiac arrest, started hypothermia protocol 1/11 hypothermia protocol +posturing 1/12 signs of brain damage, needs NEURO consultation 1/13 failed SAT/SBT, unable to follow commands 1/14- for SBT today, electrolytes are improved. Encephalopathic working on mental status. 12/07/20-Patient remains crtically ill and is with interval worsening MRI brain, I met with husband to briefly review. Discussed case with Neurologist today and there is plan for meeting with family. 12/14/20- patient for LTAC placement s/p PEG/trache. No events today. 1/23severe brain damage 1/24 severe Brain Damage, severe agitation 1/25 severe brain damage 1/27 bouts ofventilator asynchrony 1/30 focal seizure activity around mouth noted started on [place] neurology re consulted 1/31 severe agitation, TCT 1 hr 2/2 severe agitation, remains on vent, TCT for 12 hrs 12/27/2020 - patient with accelerated HTN during exam this am, shes moving extermitiesx4 but encephalopathic. 12/28/2020-  Patient unable to communicate and is s/p trache and PEG, no acute events overnight. 12/29/2020- Discussed case with neurologist, s/p re-eval, seems that there may be intermittent periods of possible seizure activity and we discussed possibly needing transfer out to [facility] due to seizure activity.  Met with husband at bedside today.     Lines/tubes : PICC Double Lumen  12/12/20 PICC Left Brachial 42 cm 1 cm (Active)  Indication for Insertion or Continuance of Line Prolonged intravenous therapies 12/27/20 2035  Exposed Catheter (cm) 1 cm 12/13/20 0400  Site Assessment Clean;Dry;Intact 12/28/20 0650  Lumen #1 Status Blood return noted;Flushed;Cap changed 12/28/20 0650  Lumen #2 Status Cap changed;Flushed;Blood return noted 12/28/20 0650  Dressing Type Transparent 12/28/20 0650  Dressing Status Clean;Dry;Intact 12/28/20 0650  Antimicrobial disc in place? Yes 12/28/20 0650  Safety Lock Not Applicable 93/79/02 4097  Line Care Connections checked and tightened 12/28/20 0650  Line Adjustment (NICU/IV Team Only) No 12/22/20 0000  Dressing Intervention Dressing changed;Antimicrobial disc changed 12/22/20 0500  Dressing Change Due 12/29/20 12/28/20 0650     Gastrostomy/Enterostomy Percutaneous endoscopic gastrostomy (PEG) LUQ (Active)  Surrounding Skin Dry;Intact 12/28/20 0319  Tube Status Patent 12/28/20 0319  Drainage Appearance None 12/25/20 2000  Dressing Status Clean;Dry;Intact 12/28/20 0319  Dressing Intervention Dressing changed 12/19/20 0000  Dressing Type Split gauze 12/28/20 0319  Dressing Change Due 12/25/20 12/24/20 0800  G Port Intake (mL) 165 ml 12/26/20 1600  Output (mL) 0 mL 12/17/20 1700     External Urinary Catheter (Active)  Collection Container Dedicated Suction Canister 12/28/20 0319  Securement Method Securing device (Describe) 12/28/20 0319  Site Assessment Clean;Intact 12/28/20 0319  Intervention Equipment Changed 12/27/20 1600  Output (mL) 0 mL 12/28/20 0319    Microbiology/Sepsis markers: Results for orders placed or performed during the hospital encounter of 12/02/20  Resp Panel by RT-PCR (Flu A&B, Covid) Nasopharyngeal Swab     Status: None   Collection Time: 12/02/20  7:23 AM   Specimen: Nasopharyngeal Swab; Nasopharyngeal(NP) swabs in vial transport medium  Result Value Ref Range Status   SARS Coronavirus 2 by RT PCR  NEGATIVE NEGATIVE Final    Comment: (NOTE) SARS-CoV-2 target nucleic acids are NOT DETECTED.  The SARS-CoV-2 RNA  is generally detectable in upper respiratory specimens during the acute phase of infection. The lowest concentration of SARS-CoV-2 viral copies this assay can detect is 138 copies/mL. A negative result does not preclude SARS-Cov-2 infection and should not be used as the sole basis for treatment or other patient management decisions. A negative result may occur with  improper specimen collection/handling, submission of specimen other than nasopharyngeal swab, presence of viral mutation(s) within the areas targeted by this assay, and inadequate number of viral copies(<138 copies/mL). A negative result must be combined with clinical observations, patient history, and epidemiological information. The expected result is Negative.  Fact Sheet for Patients:  EntrepreneurPulse.com.au  Fact Sheet for Healthcare Providers:  IncredibleEmployment.be  This test is no t yet approved or cleared by the [place] FDA and  has been authorized for detection and/or diagnosis of SARS-CoV-2 by FDA under an Emergency Use Authorization (EUA). This EUA will remain  in effect (meaning this test can be used) for the duration of the COVID-19 declaration under Section 564(b)(1) of the Act, 21 U.S.C.section 360bbb-3(b)(1), unless the authorization is terminated  or revoked sooner.       Influenza A by PCR NEGATIVE NEGATIVE Final   Influenza B by PCR NEGATIVE NEGATIVE Final    Comment: (NOTE) The Xpert Xpress SARS-CoV-2/FLU/RSV plus assay is intended as an aid in the diagnosis of influenza from Nasopharyngeal swab specimens and should not be used as a sole basis for treatment. Nasal washings and aspirates are unacceptable for Xpert Xpress SARS-CoV-2/FLU/RSV testing.  Fact Sheet for Patients: EntrepreneurPulse.com.au  Fact Sheet for  Healthcare Providers: IncredibleEmployment.be  This test is not yet approved or cleared by the [place] FDA and has been authorized for detection and/or diagnosis of SARS-CoV-2 by FDA under an Emergency Use Authorization (EUA). This EUA will remain in effect (meaning this test can be used) for the duration of the COVID-19 declaration under Section 564(b)(1) of the Act, 21 U.S.C. section 360bbb-3(b)(1), unless the authorization is terminated or revoked.  Performed at [facility], [address] [place], [place] [address]   Urine culture     Status: None   Collection Time: 12/02/20  7:23 AM   Specimen: In/Out Cath Urine  Result Value Ref Range Status   Specimen Description   Final    IN/OUT CATH URINE Performed at [facility], [address], [place], [place] [address]    Special Requests   Final    NONE Performed at [facility], [address] [place], [place] [address]    Culture   Final    NO GROWTH Performed at [facility], [place] [address], [place], [place] [address]    Report Status 12/03/2020 FINAL  Final  Blood Culture (routine x 2)     Status: None   Collection Time: 12/02/20  8:37 AM   Specimen: BLOOD  Result Value Ref Range Status   Specimen Description BLOOD BLOOD RIGHT HAND  Final   Special Requests   Final    BOTTLES DRAWN AEROBIC AND ANAEROBIC Blood Culture adequate volume   Culture   Final    NO GROWTH 5 DAYS Performed at [facility], [address], [place],  [address]    Report Status 12/07/2020 FINAL  Final  Blood Culture (routine x 2)     Status: None   Collection Time: 12/02/20  8:37 AM   Specimen: BLOOD  Result Value Ref Range Status   Specimen Description BLOOD RIGHT ARM  Final   Special Requests   Final    BOTTLES DRAWN AEROBIC AND ANAEROBIC Blood Culture adequate volume   Culture   Final    NO GROWTH 5 DAYS Performed at [facility], [address] [place], [place] [address]    Report Status 12/07/2020 FINAL  Final  MRSA PCR Screening     Status: None   Collection Time: 12/02/20  9:33 AM   Specimen: Nasopharyngeal  Result Value Ref Range Status   MRSA by PCR NEGATIVE NEGATIVE Final    Comment:        The GeneXpert MRSA Assay (FDA approved for NASAL specimens only), is one component of a comprehensive MRSA colonization surveillance program. It is not intended to diagnose MRSA infection nor to guide or monitor treatment for MRSA infections. Performed at [facility], [address] [place], [place] [address]   Culture, blood (routine x 2)     Status: None   Collection Time: 12/10/20 11:28 AM   Specimen: BLOOD  Result Value Ref Range Status   Specimen Description BLOOD LINE  Final   Special Requests   Final    BOTTLES DRAWN AEROBIC AND ANAEROBIC Blood Culture adequate volume   Culture   Final    NO GROWTH 5 DAYS Performed at [facility], [address] [place], [place] [address]    Report Status 12/15/2020 FINAL  Final  Culture, blood (routine x 2)     Status: None   Collection Time: 12/10/20 11:38 AM   Specimen: BLOOD RIGHT HAND  Result Value Ref Range Status   Specimen Description BLOOD RIGHT HAND  Final   Special Requests   Final    BOTTLES DRAWN AEROBIC AND ANAEROBIC Blood Culture adequate volume   Culture   Final    NO GROWTH 5 DAYS Performed at [facility], [address] [place], [place] [address]    Report Status 12/15/2020 FINAL  Final  Culture, respiratory (non-expectorated)     Status: None   Collection Time: 12/10/20  2:12 PM   Specimen: Tracheal Aspirate; Respiratory  Result Value Ref Range Status   Specimen Description   Final    TRACHEAL ASPIRATE Performed at [facility], [address] [place], [place] [address]    Special Requests   Final    NONE Performed at [facility], [place], [place] [address]    Gram Stain   Final     RARE WBC PRESENT, PREDOMINANTLY PMN ABUNDANT GRAM POSITIVE COCCI FEW GRAM POSITIVE RODS RARE GRAM NEGATIVE RODS Performed at [facility], [place] [address], [place],  [address]    Culture ABUNDANT STAPHYLOCOCCUS AUREUS  Final   Report Status 12/13/2020 FINAL  Final   Organism ID, Bacteria STAPHYLOCOCCUS AUREUS  Final      Susceptibility   Staphylococcus aureus - MIC*    CIPROFLOXACIN <=0.5 SENSITIVE Sensitive     ERYTHROMYCIN <=0.25 SENSITIVE Sensitive     GENTAMICIN <=0.5 SENSITIVE Sensitive     OXACILLIN <=0.25 SENSITIVE Sensitive     TETRACYCLINE <=1 SENSITIVE Sensitive     VANCOMYCIN 1 SENSITIVE Sensitive     TRIMETH/SULFA <=10 SENSITIVE Sensitive     CLINDAMYCIN <=0.25 SENSITIVE Sensitive     RIFAMPIN <=0.5 SENSITIVE Sensitive     Inducible Clindamycin NEGATIVE Sensitive     * ABUNDANT STAPHYLOCOCCUS AUREUS  Urine Culture     Status: Abnormal   Collection Time: 12/10/20  2:30 PM   Specimen:  Urine, Random  Result Value Ref Range Status   Specimen Description   Final    URINE, RANDOM Performed at [facility], [address] [place], [place] [address]    Special Requests   Final    NONE Performed at [facility], [address] [place], [place] [address]    Culture 30,000 COLONIES/mL ENTEROCOCCUS FAECALIS (A)  Final   Report Status 12/13/2020 FINAL  Final   Organism ID, Bacteria ENTEROCOCCUS FAECALIS (A)  Final      Susceptibility   Enterococcus faecalis - MIC*    AMPICILLIN <=2 SENSITIVE Sensitive     NITROFURANTOIN <=16 SENSITIVE Sensitive     VANCOMYCIN 2 SENSITIVE Sensitive     * 30,000 COLONIES/mL ENTEROCOCCUS FAECALIS  Urine Culture     Status: Abnormal   Collection Time: 12/11/20  4:40 PM   Specimen: Urine, Random  Result Value Ref Range Status   Specimen Description   Final    URINE, RANDOM Performed at [facility], [address] [place], [place] [address]    Special Requests   Final    NONE Performed at  [facility], [address], [place], [place] [address]    Culture >=100,000 COLONIES/mL ENTEROCOCCUS FAECALIS (A)  Final   Report Status 12/14/2020 FINAL  Final   Organism ID, Bacteria ENTEROCOCCUS FAECALIS (A)  Final      Susceptibility   Enterococcus faecalis - MIC*    AMPICILLIN <=2 SENSITIVE Sensitive     NITROFURANTOIN <=16 SENSITIVE Sensitive     VANCOMYCIN 2 SENSITIVE Sensitive     * >=100,000 COLONIES/mL ENTEROCOCCUS FAECALIS    Anti-infectives:  Anti-infectives (From admission, onward)   Start     Dose/Rate Route Frequency Ordered Stop   12/20/20 1000  fluconazole (DIFLUCAN) tablet 100 mg       "Followed by" Linked Group Details   100 mg Per Tube Daily 12/19/20 0717 12/25/20 0922   12/19/20 1000  fluconazole (DIFLUCAN) tablet 200 mg       "Followed by" Linked Group Details   200 mg Per Tube  Once 12/19/20 0717 12/19/20 1509   12/13/20 2200  Ampicillin-Sulbactam (UNASYN) 3 g in sodium chloride 0.9 % 100 mL IVPB        3 g 200 mL/hr over 30 Minutes Intravenous Every 6 hours 12/13/20 1528 12/20/20 1648   12/13/20 1400  ceFAZolin (ANCEF) IVPB 2g/100 mL premix  Status:  Discontinued        2 g 200 mL/hr over 30 Minutes Intravenous Every 8 hours 12/13/20 1050 12/13/20 1115   12/13/20 1215  ceFAZolin (ANCEF) IVPB 2g/100 mL premix        2 g 200 mL/hr over 30 Minutes Intravenous  Once 12/13/20 1115 12/13/20 1337   12/11/20 1000  cefTRIAXone (ROCEPHIN) 1 g in sodium chloride 0.9 % 100 mL IVPB  Status:  Discontinued        1 g 200 mL/hr over 30 Minutes Intravenous Every 24 hours 12/11/20 0852 12/13/20 1528   12/03/20 0800  cefTRIAXone (ROCEPHIN) 1 g in sodium chloride 0.9 % 100 mL IVPB  Status:  Discontinued        1 g 200 mL/hr over 30 Minutes Intravenous Every 24 hours 12/02/20 1007 12/04/20 1547   12/02/20 0815  cefTRIAXone (ROCEPHIN) 1 g in sodium chloride 0.9 % 100 mL IVPB        1 g 200 mL/hr over 30 Minutes Intravenous  Once 12/02/20 0804 12/02/20 0925  PAST MEDICAL HISTORY   Past Medical History:  Diagnosis Date  . Hypertension      SURGICAL HISTORY   Past Surgical History:  Procedure Laterality Date  . PEG PLACEMENT N/A 12/13/2020   Procedure: PERCUTANEOUS ENDOSCOPIC GASTROSTOMY (PEG) PLACEMENT;  Surgeon: Lesly Rubenstein, MD;  Location: [facility] ENDOSCOPY;  Service: Endoscopy;  Laterality: N/A;  . TRACHEOSTOMY TUBE PLACEMENT N/A 12/13/2020   Procedure: TRACHEOSTOMY;  Surgeon: Clyde Canterbury, MD;  Location: [facility] ORS;  Service: ENT;  Laterality: N/A;     FAMILY HISTORY   Family History  Problem Relation Age of Onset  . Hypertension Mother   . Leukemia Mother   . Diabetes Mother   . Healthy Father      SOCIAL HISTORY   Social History   Tobacco Use  . Smoking status: Former Smoker    Quit date: 2007    Years since quitting: 15.1  . Smokeless tobacco: Never Used  Vaping Use  . Vaping Use: Never used  Substance Use Topics  . Alcohol use: No  . Drug use: Never     MEDICATIONS   Current Medication:  Current Facility-Administered Medications:  .  0.9 %  sodium chloride infusion (Manually program via Guardrails IV Fluids), , Intravenous, Once, Rust-Chester, Britton L, NP .  0.9 %  sodium chloride infusion, 250 mL, Intravenous, Continuous, Darel Hong D, NP, Last Rate: 10 mL/hr at 12/19/20 0449, 250 mL at 12/19/20 0449 .  acetaminophen (TYLENOL) tablet 650 mg, 650 mg, Per Tube, Q8H PRN, Rust-Chester, Britton L, NP, 650 mg at 12/28/20 0107 .  alteplase (CATHFLO ACTIVASE) injection 2 mg, 2 mg, Intracatheter, Once, Kasa, Kurian, MD .  amLODipine (NORVASC) tablet 10 mg, 10 mg, Per Tube, Daily, Flora Lipps, MD, 10 mg at 12/28/20 0926 .  atorvastatin (LIPITOR) tablet 80 mg, 80 mg, Per Tube, Daily, Flora Lipps, MD, 80 mg at 12/28/20 0926 .  chlorhexidine gluconate (MEDLINE KIT) (PERIDEX) 0.12 % solution 15 mL, 15 mL, Mouth Rinse, BID, Kasa, Kurian, MD, 15 mL at 12/29/20 0840 .  Chlorhexidine Gluconate Cloth  2 % PADS 6 each, 6 each, Topical, Daily, Flora Lipps, MD, 6 each at 12/28/20 0920 .  clonazePAM (KLONOPIN) tablet 1 mg, 1 mg, Per Tube, BID, Nelle Don, MD, 1 mg at 12/28/20 2100 .  cloNIDine (CATAPRES) tablet 0.1 mg, 0.1 mg, Per Tube, Q6H PRN, Nelle Don, MD, 0.1 mg at 12/25/20 2216 .  dexmedetomidine (PRECEDEX) 400 MCG/100ML (4 mcg/mL) infusion, 0.4-1.2 mcg/kg/hr, Intravenous, Titrated, Blakeney, Dreama Saa, NP, Last Rate: 8.77 mL/hr at 12/26/20 0025, 0.4 mcg/kg/hr at 12/26/20 0025 .  feeding supplement (KATE FARMS STANDARD 1.4) liquid 325 mL, 325 mL, Per Tube, QID, Flora Lipps, MD, 325 mL at 12/28/20 2100 .  free water 200 mL, 200 mL, Per Tube, QID, Flora Lipps, MD, 200 mL at 12/29/20 0824 .  guaiFENesin (ROBITUSSIN) 100 MG/5ML solution 200 mg, 200 mg, Per Tube, Q4H PRN, Rust-Chester, Britton L, NP, 200 mg at 12/25/20 2220 .  heparin injection 5,000 Units, 5,000 Units, Subcutaneous, Q8H, Flora Lipps, MD, 5,000 Units at 12/29/20 0540 .  hydrALAZINE (APRESOLINE) injection 10 mg, 10 mg, Intravenous, Q4H PRN, Nelle Don, MD, 10 mg at 12/25/20 0125 .  HYDROmorphone (DILAUDID) injection 1 mg, 1 mg, Intravenous, Q2H PRN, Nelle Don, MD, 1 mg at 12/26/20 1649 .  insulin aspart (novoLOG) injection 0-15 Units, 0-15 Units, Subcutaneous, Q4H, Flora Lipps, MD, 2 Units at 12/29/20 0432 .  levETIRAcetam (KEPPRA) 100 MG/ML solution 1,000 mg, 1,000 mg, Per Tube,  BID, Greta Doom, MD, 1,000 mg at 12/28/20 2100 .  MEDLINE mouth rinse, 15 mL, Mouth Rinse, 10 times per day, Flora Lipps, MD, 15 mL at 12/29/20 0604 .  midazolam (VERSED) injection 2 mg, 2 mg, Intravenous, Q1H PRN, Rust-Chester, Britton L, NP, 2 mg at 12/26/20 0806 .  morphine 2 MG/ML injection 2 mg, 2 mg, Intravenous, Q2H PRN, Flora Lipps, MD, 2 mg at 12/21/20 2126 .  multivitamin with minerals tablet 1 tablet, 1 tablet, Per Tube, Daily, Flora Lipps, MD, 1 tablet at 12/28/20 0926 .  oxyCODONE (Oxy IR/ROXICODONE)  immediate release tablet 10 mg, 10 mg, Per Tube, Q6H, Flora Lipps, MD, 10 mg at 12/29/20 0820 .  pantoprazole sodium (PROTONIX) 40 mg/20 mL oral suspension 40 mg, 40 mg, Per Tube, QHS, Flora Lipps, MD, 40 mg at 12/28/20 2100 .  polyethylene glycol (MIRALAX / GLYCOLAX) packet 17 g, 17 g, Per Tube, Daily, Benita Gutter, RPH, 17 g at 12/26/20 3825 .  QUEtiapine (SEROQUEL) tablet 25 mg, 25 mg, Per Tube, QHS, Flora Lipps, MD, 25 mg at 12/28/20 2100 .  senna-docusate (Senokot-S) tablet 1 tablet, 1 tablet, Per Tube, Daily, Benita Gutter, RPH, 1 tablet at 12/26/20 0539 .  sodium chloride flush (NS) 0.9 % injection 10-40 mL, 10-40 mL, Intracatheter, Q12H, Freddi Starr, MD, 10 mL at 12/29/20 0058 .  sodium chloride flush (NS) 0.9 % injection 10-40 mL, 10-40 mL, Intracatheter, PRN, Freddi Starr, MD, 10 mL at 12/26/20 2150    ALLERGIES   Patient has no known allergies.    REVIEW OF SYSTEMS    Unable to obtain due to anoxic brain inury  PHYSICAL EXAMINATION   Vital Signs: Temp:  [98.3 F (36.8 C)-100.7 F (38.2 C)] 99.2 F (37.3 C) (02/06 0343) Pulse Rate:  [75-117] 87 (02/06 0600) Resp:  [15-30] 29 (02/06 0600) BP: (111-167)/(67-104) 125/72 (02/06 0600) SpO2:  [95 %-100 %] 98 % (02/06 0600) FiO2 (%):  [28 %] 28 % (02/05 1950)    GENERAL ill appearing,  EYES: Pupils equal, round, reactive to light.  No scleral icterus.  NECK:s/p trach PULMONARY: ctab with minimal rhonchi today CARDIOVASCULAR: S1 and S2. Regular rate and rhythm. No murmurs, rubs, or gallops.  GASTROINTESTINAL: Soft, nontender, -distended.  Positive bowel sounds.   MUSCULOSKELETAL: No swelling, clubbing, or edema.  NEUROLOGIC: more alert and awake SKIN:intact,warm,dry   PERTINENT DATA     Infusions: . sodium chloride 250 mL (12/19/20 0449)  . dexmedetomidine (PRECEDEX) IV infusion 0.4 mcg/kg/hr (12/26/20 0025)   Scheduled Medications: . sodium chloride   Intravenous Once  . alteplase  2  mg Intracatheter Once  . amLODipine  10 mg Per Tube Daily  . atorvastatin  80 mg Per Tube Daily  . chlorhexidine gluconate (MEDLINE KIT)  15 mL Mouth Rinse BID  . Chlorhexidine Gluconate Cloth  6 each Topical Daily  . clonazePAM  1 mg Per Tube BID  . feeding supplement (KATE FARMS STANDARD 1.4)  325 mL Per Tube QID  . free water  200 mL Per Tube QID  . heparin  5,000 Units Subcutaneous Q8H  . insulin aspart  0-15 Units Subcutaneous Q4H  . levETIRAcetam  1,000 mg Per Tube BID  . mouth rinse  15 mL Mouth Rinse 10 times per day  . multivitamin with minerals  1 tablet Per Tube Daily  . oxyCODONE  10 mg Per Tube Q6H  . pantoprazole sodium  40 mg Per Tube QHS  . polyethylene glycol  17  g Per Tube Daily  . QUEtiapine  25 mg Per Tube QHS  . senna-docusate  1 tablet Per Tube Daily  . sodium chloride flush  10-40 mL Intracatheter Q12H   PRN Medications: acetaminophen, cloNIDine, guaiFENesin, hydrALAZINE, HYDROmorphone (DILAUDID) injection, midazolam, morphine injection, sodium chloride flush Hemodynamic parameters:   Intake/Output: 02/05 0701 - 02/06 0700 In: 625.3 [NG/GT:200; IV Piggyback:0.3] Out: 900 [Urine:900]  Ventilator  Settings: FiO2 (%):  [28 %] 28 %    LAB RESULTS:  Basic Metabolic Panel: Recent Labs  Lab 12/25/20 0432 12/26/20 0619 12/27/20 0500 12/28/20 0645 12/29/20 0404  NA 143 141 140 143 140  K 3.6 3.8 3.7 4.3 4.2  CL 105 104 104 104 103  CO2 26 27 26 28 28   GLUCOSE 164* 149* 131* 127* 157*  BUN 22* 24* 20 23* 19  CREATININE 0.54 0.64 0.49 0.50 0.49  CALCIUM 8.9 9.2 9.5 9.6 9.3  MG 1.9 2.2 1.9 2.0 1.8  PHOS 3.3 4.6 3.6 4.9* 3.8   Liver Function Tests: Recent Labs  Lab 12/25/20 0432 12/26/20 0619 12/27/20 0500 12/28/20 0645 12/29/20 0404  ALBUMIN 2.6* 2.6* 3.0* 2.7* 2.8*   No results for input(s): LIPASE, AMYLASE in the last 168 hours. No results for input(s): AMMONIA in the last 168 hours. CBC: Recent Labs  Lab 12/25/20 0432 12/26/20 0619  12/27/20 0500 12/28/20 0645 12/29/20 0404  WBC 8.3 7.3 8.7 8.7 8.8  NEUTROABS 6.4 5.0 5.7 5.8 6.0  HGB 9.5* 8.7* 9.5* 9.6* 9.7*  HCT 30.5* 28.5* 32.1* 32.3* 32.1*  MCV 83.8 86.1 85.1 85.2 85.6  PLT 376 377 392 343 333   Cardiac Enzymes: No results for input(s): CKTOTAL, CKMB, CKMBINDEX, TROPONINI in the last 168 hours. BNP: Invalid input(s): POCBNP CBG: Recent Labs  Lab 12/28/20 1554 12/28/20 1923 12/28/20 2327 12/29/20 0338 12/29/20 0718  GLUCAP 140* 121* 178* 148* 114*       IMAGING RESULTS:  Imaging: No results found. @PROBHOSP @ No results found.   ASSESSMENT AND PLAN      55 y.o.obese F admiktted for acute cardiac arrest due to Severe acidosis with DKAwith hx of HTN p/w myoclonic jerking and unresponsiveness following cardiac arrest with unknown down time, initial rhythm of Vfib, prolonged ACLS before ROSC, now s/p hypothermia protocol. MRI Brain consistent with diffuse hypoxic/anoxic injury- brain damage s/p trach and PEG    Acute anoxic brain Injury    - Due to cardiac arrest   -s/p neurology evaluation - appreciate input -s/p eval 12/29/2020 - Dr Leonel Ramsay - recommendations peding.   - s/p serial brain imaging with most recent as below                      IMPRESSION: Findings consistent with anoxic brain injury. 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 Evolutionary changes likely related to involving ischemia compared with the prior MRI of 12/07/2020. Diffusion-weighted signal is less apparent on the current study compared to the prior study. Correlate with neurologic exam.          -currently we are working with [facility] for placement, there were some challenges with insurance coverage and seems patient needs to be off ventilator to have CIR placement per social worker documentation.     Acute cardiac arrest  -  S/p ACLS with ROSC -s/p TTM  DKA -s/p full scope of treatment     - resolved  DKA   Severe ACUTE Hypoxic and Hypercapnic Respiratory Failure    -  due to cardiac arrest    - s/p tracheostomy    - for LTACH placement -continue Bronchodilator Therapy -Wean Fio2 and PEEP as tolerated -will perform SAT/SBT when respiratory parameters are met -VAP/VENT bundle implementation TCT as tolerated     ID -continue IV abx as prescibed -follow up cultures  GI/Nutrition GI PROPHYLAXIS as indicated DIET-->TF's as tolerated Constipation protocol as indicated  ENDO - ICU hypoglycemic\Hyperglycemia protocol -check FSBS per protocol   ELECTROLYTES -follow labs as needed -replace as needed -pharmacy consultation   DVT/GI PRX ordered -SCDs  TRANSFUSIONS AS NEEDED MONITOR FSBS ASSESS the need for LABS as needed   Critical care provider statement:    Critical care time (minutes):  33   Critical care time was exclusive of:  Separately billable procedures and treating other patients   Critical care was necessary to treat or prevent imminent or life-threatening deterioration of the following conditions:  cardiac arrest, anoxic brain injury, multiple comorbid conditions   Critical care was time spent personally by me on the following activities:  Development of treatment plan with patient or surrogate, discussions with consultants, evaluation of patient's response to treatment, examination of patient, obtaining history from patient or surrogate, ordering and performing treatments and interventions, ordering and review of laboratory studies and re-evaluation of patient's condition.  I assumed direction of critical care for this patient from another provider in my specialty: no    This document was prepared using Dragon voice recognition software and may include unintentional dictation errors.    Ottie Glazier, M.D.  Division of [facility]

## 2020-12-30 LAB — CBC WITH DIFFERENTIAL/PLATELET
Abs Immature Granulocytes: 0.02 10*3/uL (ref 0.00–0.07)
Basophils Absolute: 0.1 10*3/uL (ref 0.0–0.1)
Basophils Relative: 1 %
Eosinophils Absolute: 0.3 10*3/uL (ref 0.0–0.5)
Eosinophils Relative: 4 %
HCT: 32 % — ABNORMAL LOW (ref 36.0–46.0)
Hemoglobin: 10 g/dL — ABNORMAL LOW (ref 12.0–15.0)
Immature Granulocytes: 0 %
Lymphocytes Relative: 21 %
Lymphs Abs: 1.4 10*3/uL (ref 0.7–4.0)
MCH: 26.7 pg (ref 26.0–34.0)
MCHC: 31.3 g/dL (ref 30.0–36.0)
MCV: 85.6 fL (ref 80.0–100.0)
Monocytes Absolute: 0.5 10*3/uL (ref 0.1–1.0)
Monocytes Relative: 7 %
Neutro Abs: 4.6 10*3/uL (ref 1.7–7.7)
Neutrophils Relative %: 67 %
Platelets: 310 10*3/uL (ref 150–400)
RBC: 3.74 MIL/uL — ABNORMAL LOW (ref 3.87–5.11)
RDW: 19.6 % — ABNORMAL HIGH (ref 11.5–15.5)
WBC: 6.9 10*3/uL (ref 4.0–10.5)
nRBC: 0 % (ref 0.0–0.2)

## 2020-12-30 LAB — RENAL FUNCTION PANEL
Albumin: 2.9 g/dL — ABNORMAL LOW (ref 3.5–5.0)
Anion gap: 11 (ref 5–15)
BUN: 16 mg/dL (ref 6–20)
CO2: 28 mmol/L (ref 22–32)
Calcium: 9.3 mg/dL (ref 8.9–10.3)
Chloride: 103 mmol/L (ref 98–111)
Creatinine, Ser: 0.59 mg/dL (ref 0.44–1.00)
GFR, Estimated: >60 mL/min (ref >60)
Glucose, Bld: 129 mg/dL — ABNORMAL HIGH (ref 70–99)
Phosphorus: 4 mg/dL (ref 2.5–4.6)
Potassium: 3.7 mmol/L (ref 3.5–5.1)
Sodium: 142 mmol/L (ref 135–145)

## 2020-12-30 LAB — GLUCOSE, CAPILLARY
Glucose-Capillary: 125 mg/dL — ABNORMAL HIGH (ref 70–99)
Glucose-Capillary: 120 mg/dL — ABNORMAL HIGH (ref 70–99)
Glucose-Capillary: 135 mg/dL — ABNORMAL HIGH (ref 70–99)
Glucose-Capillary: 115 mg/dL — ABNORMAL HIGH (ref 70–99)
Glucose-Capillary: 186 mg/dL — ABNORMAL HIGH (ref 70–99)
Glucose-Capillary: 165 mg/dL — ABNORMAL HIGH (ref 70–99)

## 2020-12-30 LAB — MAGNESIUM: Magnesium: 2 mg/dL (ref 1.7–2.4)

## 2020-12-30 MED ORDER — POTASSIUM CHLORIDE 20 MEQ PO PACK
40.0000 meq | PACK | Freq: Once | ORAL | Status: AC
  Administered 2020-12-30: 40 meq
  Filled 2020-12-30: qty 2

## 2020-12-30 MED ORDER — FUROSEMIDE 10 MG/ML IJ SOLN
20.0000 mg | Freq: Once | INTRAMUSCULAR | Status: AC
  Administered 2020-12-30: 20 mg via INTRAVENOUS
  Filled 2020-12-30: qty 2

## 2020-12-30 MED ORDER — POTASSIUM CHLORIDE 20 MEQ PO PACK: 40.0000 meq | PACK | Freq: Every day | ORAL | Status: DC

## 2020-12-30 MED ORDER — OXYCODONE HCL 5 MG PO TABS
5.0000 mg | ORAL_TABLET | Freq: Four times a day (QID) | ORAL | Status: DC
  Administered 2020-12-30 – 2021-01-08 (×31): 5 mg
  Filled 2020-12-30 (×33): qty 1

## 2020-12-30 NOTE — Progress Notes (Signed)
Physical Therapy Treatment Patient Details Name: [name] [name] MRN: [id number] DOB: Feb 24, 1966 Today's Date: 12/30/2020    History of Present Illness Pt is a 55 year old female presented to the ED via EMS post witnessed cardiac arrest. ROSC achieved following 3 defibrillations and one round of epinephrine. Patient found to have significant hypokalemia on arrival. Given witnessed arrest, patient Code ICE.  S/p trach and peg tube placement.  Workup showed anoxic brain injury. Pt also showed facial twitching that could be myoclonic versus partial seizure.    PT Comments    Pt alert, able to track PT around the room, smile, open mouth and initiate fist bump with LUE at end of session. MaxA to EOB initially min-modA to maintain balance but once repositioned in midline pt able to progress to CGA-minA, R lateral lean noted today (towards therapist). Significant improvement in pt upright posture today and head positioning. Pt able to initiate righting reactions to return to midline in sitting with extended time to process. Sit <> Stand attempted with two person assist, maxAx2. Improved upright posture noted but pt still exhibited difficulty with LE muscle activation and knee buckling noted. Bed placed in chair position at end of session, and pt blowing kisses at PT exit. The patient exhibited progression towards goals and remains an excellent candidate for CIR to maximize function and independence.      Follow Up Recommendations  CIR     Equipment Recommendations  Other (comment) (TBD)    Recommendations for Other Services OT consult;Rehab consult     Precautions / Restrictions Precautions Precautions: Fall Precaution Comments: trach collar 5 L Restrictions Weight Bearing Restrictions: No    Mobility  Bed Mobility Overal bed mobility: Needs Assistance Bed Mobility: Supine to Sit;Sit to Supine     Supine to sit: Max assist;HOB elevated Sit to supine: Max assist;+2 for physical  assistance      Transfers Overall transfer level: Needs assistance Equipment used: 2 person hand held assist Transfers: Sit to/from Stand Sit to Stand: Max assist;+2 physical assistance;+2 safety/equipment         General transfer comment: three musketeer style standing from bed. Pt able to come more fully upright, but little activation noted in LEs to weight bear. No steps taken secondary to safety.  Ambulation/Gait                 Stairs             Wheelchair Mobility    Modified Rankin (Stroke Patients Only)       Balance Overall balance assessment: Needs assistance   Sitting balance-Leahy Scale: Fair                                      Cognition Arousal/Alertness: Awake/alert Behavior During Therapy: Flat affect Overall Cognitive Status: Difficult to assess                                 General Comments: pt non verbal, pt is cooperative, able to follow commands with extended time      Exercises Other Exercises Other Exercises: Majority of session pt able to spend in sitting. varying levels of assistance needed Other Exercises: pt encouraged to track PT, open mouth, smile, fist bump with LUE. Pt able to initiate LUE movement on command today    General Comments  Pertinent Vitals/Pain Pain Assessment: Faces Faces Pain Scale: No hurt Pain Intervention(s): Monitored during session;Repositioned    Home Living                      Prior Function            PT Goals (current goals can now be found in the care plan section) Progress towards PT goals: Progressing toward goals    Frequency    Min 2X/week      PT Plan Current plan remains appropriate    Co-evaluation              AM-PAC PT "6 Clicks" Mobility   Outcome Measure  Help needed turning from your back to your side while in a flat bed without using bedrails?: Total Help needed moving from lying on your back to  sitting on the side of a flat bed without using bedrails?: Total Help needed moving to and from a bed to a chair (including a wheelchair)?: Total Help needed standing up from a chair using your arms (e.g., wheelchair or bedside chair)?: Total Help needed to walk in hospital room?: Total Help needed climbing 3-5 steps with a railing? : Total 6 Click Score: 6    End of Session Equipment Utilized During Treatment: Oxygen Activity Tolerance: Patient tolerated treatment well Patient left: in bed;with call bell/phone within reach;Other (comment) (bed in chair position) Nurse Communication: Mobility status PT Visit Diagnosis: Other abnormalities of gait and mobility (R26.89);Muscle weakness (generalized) (M62.81);Difficulty in walking, not elsewhere classified (R26.2);Other symptoms and signs involving the nervous system (Z16.967)     Time: 8938-1017 PT Time Calculation (min) (ACUTE ONLY): 38 min  Charges:  $Therapeutic Activity: 38-52 mins                     Lieutenant Diego PT, DPT 3:55 PM,12/30/20

## 2020-12-30 NOTE — Progress Notes (Signed)
eeg done °

## 2020-12-30 NOTE — TOC Progression Note (Addendum)
Transition of Care [facility]) - Progression Note    Patient Details  Name: [name] [name] MRN: [id number] Date of Birth: Sep 09, 1966  [facility] CM/SW [place], [place] Phone Number: [phone] 12/30/2020, 2:40 PM  Clinical Narrative:     Patient off vent for the last four days. Patient followed by CIR, for potential bed offer.Patient's Group,Darrell (Spouse)  [phone] updated.   Expected Discharge Plan: Long Term Nursing Home Barriers to Discharge: Inadequate or no insurance,Continued Medical Work up,Financial Resources  Expected Discharge Plan and Services Expected Discharge Plan: Long Term Nursing Home In-house Referral: Clinical Social Work   Post Acute Care Choice: Nursing Home Living arrangements for the past 2 months: Single Family Home                                       Social Determinants of Health (SDOH) Interventions    Readmission Risk Interventions No flowsheet data found.

## 2020-12-30 NOTE — Procedures (Signed)
Patient Name: [name] [name]  MRN: [id number]  Epilepsy Attending: Lora Havens  Referring Physician/Provider: Dr. Lesleigh Noe Date: 12/30/2020 Duration: 24.12 mins  Patient history: 55 year old female status post cardiac arrest with an episode of facial twitching.  EEG to evaluate for seizures.  Level of alertness: Awake  AEDs during EEG study: Keppra, Klonopin  Technical aspects: This EEG study was done with scalp electrodes positioned according to the 10-20 International system of electrode placement. Electrical activity was acquired at a sampling rate of 500Hz  and reviewed with a high frequency filter of 70Hz  and a low frequency filter of 1Hz . EEG data were recorded continuously and digitally stored.   Description: No posterior dominant rhythm was seen.  EEG showed continuous generalized 3 to 6 Hz theta-delta slowing admixed with 15-18hz  generalized beta activity. Physiologic photic driving was not seen during photic stimulation. Hyperventilation was not performed.     IMPRESSION: This study issuggestive of moderate diffuse encephalopathy, nonspecific etiology. No seizures and epileptiform discharges were seen throughout the recording.  [place] Svehla Barbra Sarks

## 2020-12-30 NOTE — Progress Notes (Signed)
[facility] for Electrolyte Monitoring and Replacement   Recent Labs: Potassium (mmol/L)  Date Value  12/30/2020 3.7  02/20/2013 3.4 (L)   Magnesium (mg/dL)  Date Value  12/30/2020 2.0   Calcium (mg/dL)  Date Value  12/30/2020 9.3   Calcium, Total (mg/dL)  Date Value  02/20/2013 8.7   Albumin (g/dL)  Date Value  12/30/2020 2.9 (L)  02/20/2013 3.2 (L)   Phosphorus (mg/dL)  Date Value  12/30/2020 4.0   Sodium (mmol/L)  Date Value  12/30/2020 142  02/20/2013 137    Assessment: 55 year old female presented to the ED via EMS post witnessed cardiac arrest. ROSC achieved following 3 defibrillations and one round of epinephrine. Patient found to have significant hypokalemia on arrival. Given witnessed arrest, patient Code ICE. Now s/p tracheostomy and PEG. Pharmacy to manage electrolytes.   Nutrition: ProSource BID + Vital 40 mL/hr  Goal of Therapy:  Potassium 4.0 - 5.1 mmol/L Magnesium 2.1 - 2.4 mg/dL All Other Electrolytes WNL  Plan:   Na 142, continue free water flushes 200 mL four times daily (800 mL/day)  Potassium 40 mEq x 1. Patient to receive furosemide 20 mg IV x 1, will order an additional 40 mEq potassium this evening.  Patient is requiring almost daily potassium replacement. Will order 40 mEq daily and follow.   Follow up with morning labs  Tawnya Crook, PharmD 12/30/2020 11:25 AM

## 2020-12-30 NOTE — Progress Notes (Signed)
Received in bed resting with husband at the bedside, no distress noted. Patient noted able to follows commands intentionally. Patient noted restless with elevated blood pressures (upto 782 systolic) and HR elevated (up to 130's), PRN hydralazine, clonidine, versed given and noted effective. Trach in place, suctioning provoked a strenuous cough and vomiting of water flush that was administered a few minutes prior to the suctioning. Patient had a small BM this shift. Pure wick in place. No seizure activity through this night. PEG patent and tolerating bolus feeding and flushed without difficulties noted. HOB kept elevated about 30 degrees. PICC in placed and dressing changed in accordance with [facility] protocol. Endorsed to oncoming RN

## 2020-12-30 NOTE — Progress Notes (Signed)
Inpatient Rehab Admissions:  Inpatient Rehab Consult received.  I spoke with pt's husband over the phone for rehabilitation assessment and to discuss goals and expectations of an inpatient rehab admission.  I explained average length of stay to be about 2 weeks, but would estimate for Ms. Finder 3-4 weeks.  We discussed goals of supervision to min assist, and Mr. Nachreiner explains that they have a daughter who will be able to provide 24/7 care, and multiple family members in the area that will be present to provide extra support.  He is already looking into ramp installation and working on getting D-POA and a medicaid application started.  He is very hopeful for possible CIR admission this week pending medical readiness and bed availability.  I will follow up with him in the next 1-2 days with an estimated cost and hopefully an idea of bed availability.   Signed: Shann Medal, PT, DPT Admissions Coordinator [phone] 12/30/20  12:27 PM

## 2020-12-30 NOTE — Progress Notes (Signed)
CRITICAL CARE PROGRESS NOTE    Name: [name] [name] MRN: [id number] DOB: 08/19/1966     LOS: 49   SUBJECTIVE FINDINGS & SIGNIFICANT EVENTS    Patient description:    [name] with acute cardiac arrest Labs c/w severe acidosis and severe DKA Patient with witnessed V fib arrest and shocked in the field ROSC returned patient admitted to MICU    Subjective findings:   1/10 admitted to ICU for cardiac arrest, started hypothermia protocol 1/11 hypothermia protocol +posturing 1/12 signs of brain damage, needs NEURO consultation 1/13 failed SAT/SBT, unable to follow commands 1/14- for SBT today, electrolytes are improved. Encephalopathic working on mental status. 12/07/20-Patient remains crtically ill and is with interval worsening MRI brain, I met with husband to briefly review. Discussed case with Neurologist today and there is plan for meeting with family. 12/14/20- patient for LTAC placement s/p PEG/trache. No events today. 1/23severe brain damage 1/24 severe Brain Damage, severe agitation 1/25 severe brain damage 1/27 bouts ofventilator asynchrony 1/30 focal seizure activity around mouth noted started on [place] neurology re consulted 1/31 severe agitation, TCT 1 hr 2/2 severe agitation, remains on vent, TCT for 12 hrs 12/27/2020 - patient with accelerated HTN during exam this am, shes moving extermitiesx4 but encephalopathic. 12/28/2020-  Patient unable to communicate and is s/p trache and PEG, no acute events overnight. 12/29/2020- Discussed case with neurologist, s/p re-eval, seems that there may be intermittent periods of possible seizure activity and we discussed possibly needing transfer out to [facility] due to seizure activity.  Met with husband at bedside today. 12/30/2020-has been off vent for 4 days,  currently stepdown status, transition to [place] with eventual transfer to: Inpatient rehab.    Lines/tubes : PICC Double Lumen 12/12/20 PICC Left Brachial 42 cm 1 cm (Active)  Indication for Insertion or Continuance of Line Prolonged intravenous therapies 12/27/20 2035  Exposed Catheter (cm) 1 cm 12/13/20 0400  Site Assessment Clean;Dry;Intact 12/28/20 0650  Lumen #1 Status Blood return noted;Flushed;Cap changed 12/28/20 0650  Lumen #2 Status Cap changed;Flushed;Blood return noted 12/28/20 0650  Dressing Type Transparent 12/28/20 0650  Dressing Status Clean;Dry;Intact 12/28/20 0650  Antimicrobial disc in place? Yes 12/28/20 0650  Safety Lock Not Applicable 78/67/67 2094  Line Care Connections checked and tightened 12/28/20 0650  Line Adjustment (NICU/IV Team Only) No 12/22/20 0000  Dressing Intervention Dressing changed;Antimicrobial disc changed 12/22/20 0500  Dressing Change Due 12/29/20 12/28/20 0650     Gastrostomy/Enterostomy Percutaneous endoscopic gastrostomy (PEG) LUQ (Active)  Surrounding Skin Dry;Intact 12/28/20 0319  Tube Status Patent 12/28/20 0319  Drainage Appearance None 12/25/20 2000  Dressing Status Clean;Dry;Intact 12/28/20 0319  Dressing Intervention Dressing changed 12/19/20 0000  Dressing Type Split gauze 12/28/20 0319  Dressing Change Due 12/25/20 12/24/20 0800  G Port Intake (mL) 165 ml 12/26/20 1600  Output (mL) 0 mL 12/17/20 1700     External Urinary Catheter (Active)  Collection Container Dedicated Suction Canister 12/28/20 0319  Securement Method Securing device (Describe) 12/28/20 0319  Site Assessment Clean;Intact 12/28/20 0319  Intervention Equipment Changed 12/27/20 1600  Output (mL) 0 mL 12/28/20 0319    Microbiology/Sepsis markers: Results for orders placed or performed during the hospital encounter of 12/02/20  Resp Panel by RT-PCR (Flu A&B, Covid) Nasopharyngeal Swab     Status: None   Collection Time: 12/02/20  7:23 AM   Specimen:  Nasopharyngeal Swab; Nasopharyngeal(NP) swabs in vial transport medium  Result Value Ref Range Status   SARS Coronavirus 2 by RT PCR NEGATIVE  NEGATIVE Final    Comment: (NOTE) SARS-CoV-2 target nucleic acids are NOT DETECTED.  The SARS-CoV-2 RNA is generally detectable in upper respiratory specimens during the acute phase of infection. The lowest concentration of SARS-CoV-2 viral copies this assay can detect is 138 copies/mL. A negative result does not preclude SARS-Cov-2 infection and should not be used as the sole basis for treatment or other patient management decisions. A negative result may occur with  improper specimen collection/handling, submission of specimen other than nasopharyngeal swab, presence of viral mutation(s) within the areas targeted by this assay, and inadequate number of viral copies(<138 copies/mL). A negative result must be combined with clinical observations, patient history, and epidemiological information. The expected result is Negative.  Fact Sheet for Patients:  BloggerCourse.com  Fact Sheet for Healthcare Providers:  SeriousBroker.it  This test is no t yet approved or cleared by the [place] FDA and  has been authorized for detection and/or diagnosis of SARS-CoV-2 by FDA under an Emergency Use Authorization (EUA). This EUA will remain  in effect (meaning this test can be used) for the duration of the COVID-19 declaration under Section 564(b)(1) of the Act, 21 U.S.C.section 360bbb-3(b)(1), unless the authorization is terminated  or revoked sooner.       Influenza A by PCR NEGATIVE NEGATIVE Final   Influenza B by PCR NEGATIVE NEGATIVE Final    Comment: (NOTE) The Xpert Xpress SARS-CoV-2/FLU/RSV plus assay is intended as an aid in the diagnosis of influenza from Nasopharyngeal swab specimens and should not be used as a sole basis for treatment. Nasal washings and aspirates are unacceptable for  Xpert Xpress SARS-CoV-2/FLU/RSV testing.  Fact Sheet for Patients: BloggerCourse.com  Fact Sheet for Healthcare Providers: SeriousBroker.it  This test is not yet approved or cleared by the [place] FDA and has been authorized for detection and/or diagnosis of SARS-CoV-2 by FDA under an Emergency Use Authorization (EUA). This EUA will remain in effect (meaning this test can be used) for the duration of the COVID-19 declaration under Section 564(b)(1) of the Act, 21 U.S.C. section 360bbb-3(b)(1), unless the authorization is terminated or revoked.  Performed at [facility], [address], [place], [place] [address]   Urine culture     Status: None   Collection Time: 12/02/20  7:23 AM   Specimen: In/Out Cath Urine  Result Value Ref Range Status   Specimen Description   Final    IN/OUT CATH URINE Performed at [facility], [address] [place], [place] [address]    Special Requests   Final    NONE Performed at [facility], [address], [place], [place] [address]    Culture   Final    NO GROWTH Performed at [facility], [address], [place], [place] [address]    Report Status 12/03/2020 FINAL  Final  Blood Culture (routine x 2)     Status: None   Collection Time: 12/02/20  8:37 AM   Specimen: BLOOD  Result Value Ref Range Status   Specimen Description BLOOD BLOOD RIGHT HAND  Final   Special Requests   Final    BOTTLES DRAWN AEROBIC AND ANAEROBIC Blood Culture adequate volume   Culture   Final    NO GROWTH 5 DAYS Performed at [facility], [address] [place], [place] [address]    Report Status 12/07/2020 FINAL  Final  Blood Culture (routine x 2)     Status: None   Collection Time: 12/02/20  8:37 AM  Specimen: BLOOD  Result Value Ref Range Status   Specimen Description BLOOD RIGHT ARM  Final   Special Requests   Final    BOTTLES DRAWN AEROBIC AND  ANAEROBIC Blood Culture adequate volume   Culture   Final    NO GROWTH 5 DAYS Performed at [facility], [address] [place], [place] [address]    Report Status 12/07/2020 FINAL  Final  MRSA PCR Screening     Status: None   Collection Time: 12/02/20  9:33 AM   Specimen: Nasopharyngeal  Result Value Ref Range Status   MRSA by PCR NEGATIVE NEGATIVE Final    Comment:        The GeneXpert MRSA Assay (FDA approved for NASAL specimens only), is one component of a comprehensive MRSA colonization surveillance program. It is not intended to diagnose MRSA infection nor to guide or monitor treatment for MRSA infections. Performed at [facility], [address] [place], [place] [address]   Culture, blood (routine x 2)     Status: None   Collection Time: 12/10/20 11:28 AM   Specimen: BLOOD  Result Value Ref Range Status   Specimen Description BLOOD LINE  Final   Special Requests   Final    BOTTLES DRAWN AEROBIC AND ANAEROBIC Blood Culture adequate volume   Culture   Final    NO GROWTH 5 DAYS Performed at [facility], [address] [place], [place] [address]    Report Status 12/15/2020 FINAL  Final  Culture, blood (routine x 2)     Status: None   Collection Time: 12/10/20 11:38 AM   Specimen: BLOOD RIGHT HAND  Result Value Ref Range Status   Specimen Description BLOOD RIGHT HAND  Final   Special Requests   Final    BOTTLES DRAWN AEROBIC AND ANAEROBIC Blood Culture adequate volume   Culture   Final    NO GROWTH 5 DAYS Performed at [facility], [address], [place], [place] [address]    Report Status 12/15/2020 FINAL  Final  Culture, respiratory (non-expectorated)     Status: None   Collection Time: 12/10/20  2:12 PM   Specimen: Tracheal Aspirate; Respiratory  Result Value Ref Range Status   Specimen Description   Final    TRACHEAL ASPIRATE Performed at [facility], [address] [place], [place] [address]     Special Requests   Final    NONE Performed at [facility], [place], [place] [address]    Gram Stain   Final    RARE WBC PRESENT, PREDOMINANTLY PMN ABUNDANT GRAM POSITIVE COCCI FEW GRAM POSITIVE RODS RARE GRAM NEGATIVE RODS Performed at [facility], [place] [address], [place],  [address]    Culture ABUNDANT STAPHYLOCOCCUS AUREUS  Final   Report Status 12/13/2020 FINAL  Final   Organism ID, Bacteria STAPHYLOCOCCUS AUREUS  Final      Susceptibility   Staphylococcus aureus - MIC*    CIPROFLOXACIN <=0.5 SENSITIVE Sensitive     ERYTHROMYCIN <=0.25 SENSITIVE Sensitive     GENTAMICIN <=0.5 SENSITIVE Sensitive     OXACILLIN <=0.25 SENSITIVE Sensitive     TETRACYCLINE <=1 SENSITIVE Sensitive     VANCOMYCIN 1 SENSITIVE Sensitive     TRIMETH/SULFA <=10 SENSITIVE Sensitive     CLINDAMYCIN <=0.25 SENSITIVE Sensitive     RIFAMPIN <=0.5 SENSITIVE Sensitive     Inducible Clindamycin NEGATIVE Sensitive     * ABUNDANT STAPHYLOCOCCUS AUREUS  Urine Culture  Status: Abnormal   Collection Time: 12/10/20  2:30 PM   Specimen: Urine, Random  Result Value Ref Range Status   Specimen Description   Final    URINE, RANDOM Performed at [facility], [address] [place], [place] [address]    Special Requests   Final    NONE Performed at [facility], [place], [place] [address]    Culture 30,000 COLONIES/mL ENTEROCOCCUS FAECALIS (A)  Final   Report Status 12/13/2020 FINAL  Final   Organism ID, Bacteria ENTEROCOCCUS FAECALIS (A)  Final      Susceptibility   Enterococcus faecalis - MIC*    AMPICILLIN <=2 SENSITIVE Sensitive     NITROFURANTOIN <=16 SENSITIVE Sensitive     VANCOMYCIN 2 SENSITIVE Sensitive     * 30,000 COLONIES/mL ENTEROCOCCUS FAECALIS  Urine Culture     Status: Abnormal   Collection Time: 12/11/20  4:40 PM   Specimen: Urine, Random  Result Value Ref Range Status   Specimen Description   Final    URINE,  RANDOM Performed at [facility], [address], [place], [place] [address]    Special Requests   Final    NONE Performed at [facility], [address], [place], [place] [address]    Culture >=100,000 COLONIES/mL ENTEROCOCCUS FAECALIS (A)  Final   Report Status 12/14/2020 FINAL  Final   Organism ID, Bacteria ENTEROCOCCUS FAECALIS (A)  Final      Susceptibility   Enterococcus faecalis - MIC*    AMPICILLIN <=2 SENSITIVE Sensitive     NITROFURANTOIN <=16 SENSITIVE Sensitive     VANCOMYCIN 2 SENSITIVE Sensitive     * >=100,000 COLONIES/mL ENTEROCOCCUS FAECALIS    Anti-infectives:  Anti-infectives (From admission, onward)   Start     Dose/Rate Route Frequency Ordered Stop   12/20/20 1000  fluconazole (DIFLUCAN) tablet 100 mg       "Followed by" Linked Group Details   100 mg Per Tube Daily 12/19/20 0717 12/25/20 0922   12/19/20 1000  fluconazole (DIFLUCAN) tablet 200 mg       "Followed by" Linked Group Details   200 mg Per Tube  Once 12/19/20 0717 12/19/20 1509   12/13/20 2200  Ampicillin-Sulbactam (UNASYN) 3 g in sodium chloride 0.9 % 100 mL IVPB        3 g 200 mL/hr over 30 Minutes Intravenous Every 6 hours 12/13/20 1528 12/20/20 1648   12/13/20 1400  ceFAZolin (ANCEF) IVPB 2g/100 mL premix  Status:  Discontinued        2 g 200 mL/hr over 30 Minutes Intravenous Every 8 hours 12/13/20 1050 12/13/20 1115   12/13/20 1215  ceFAZolin (ANCEF) IVPB 2g/100 mL premix        2 g 200 mL/hr over 30 Minutes Intravenous  Once 12/13/20 1115 12/13/20 1337   12/11/20 1000  cefTRIAXone (ROCEPHIN) 1 g in sodium chloride 0.9 % 100 mL IVPB  Status:  Discontinued        1 g 200 mL/hr over 30 Minutes Intravenous Every 24 hours 12/11/20 0852 12/13/20 1528   12/03/20 0800  cefTRIAXone (ROCEPHIN) 1 g in sodium chloride 0.9 % 100 mL IVPB  Status:  Discontinued        1 g 200 mL/hr over 30 Minutes Intravenous Every 24 hours 12/02/20 1007 12/04/20 1547   12/02/20 0815  cefTRIAXone  (ROCEPHIN) 1 g in sodium chloride 0.9 % 100 mL IVPB        1 g 200  mL/hr over 30 Minutes Intravenous  Once 12/02/20 0804 12/02/20 0925         PAST MEDICAL HISTORY   Past Medical History:  Diagnosis Date  . Hypertension      SURGICAL HISTORY   Past Surgical History:  Procedure Laterality Date  . PEG PLACEMENT N/A 12/13/2020   Procedure: PERCUTANEOUS ENDOSCOPIC GASTROSTOMY (PEG) PLACEMENT;  Surgeon: Lesly Rubenstein, MD;  Location: [facility] ENDOSCOPY;  Service: Endoscopy;  Laterality: N/A;  . TRACHEOSTOMY TUBE PLACEMENT N/A 12/13/2020   Procedure: TRACHEOSTOMY;  Surgeon: Clyde Canterbury, MD;  Location: [facility] ORS;  Service: ENT;  Laterality: N/A;     FAMILY HISTORY   Family History  Problem Relation Age of Onset  . Hypertension Mother   . Leukemia Mother   . Diabetes Mother   . Healthy Father      SOCIAL HISTORY   Social History   Tobacco Use  . Smoking status: Former Smoker    Quit date: 2007    Years since quitting: 15.1  . Smokeless tobacco: Never Used  Vaping Use  . Vaping Use: Never used  Substance Use Topics  . Alcohol use: No  . Drug use: Never     MEDICATIONS   Scheduled Meds: . sodium chloride   Intravenous Once  . alteplase  2 mg Intracatheter Once  . amLODipine  10 mg Per Tube Daily  . atorvastatin  80 mg Per Tube Daily  . chlorhexidine gluconate (MEDLINE KIT)  15 mL Mouth Rinse BID  . Chlorhexidine Gluconate Cloth  6 each Topical Daily  . clonazePAM  1 mg Per Tube BID  . feeding supplement (KATE [place]ARMS STANDARD 1.4)  325 mL Per Tube QID  . free water  200 mL Per Tube QID  . heparin  5,000 Units Subcutaneous Q8H  . insulin aspart  0-15 Units Subcutaneous Q4H  . levETIRAcetam  1,000 mg Per Tube BID  . mouth rinse  15 mL Mouth Rinse 10 times per day  . multivitamin with minerals  1 tablet Per Tube Daily  . oxyCODONE  5 mg Per Tube Q6H  . pantoprazole sodium  40 mg Per Tube QHS  . polyethylene glycol  17 g Per Tube Daily  . [START ON  12/31/2020] potassium chloride  40 mEq Per Tube Daily  . QUEtiapine  25 mg Per Tube QHS  . senna-docusate  1 tablet Per Tube Daily  . sodium chloride flush  10-40 mL Intracatheter Q12H   Continuous Infusions: . sodium chloride 250 mL (12/19/20 0449)   PRN Meds:.acetaminophen, cloNIDine, guaiFENesin, hydrALAZINE, HYDROmorphone (DILAUDID) injection, midazolam, morphine injection, sodium chloride flush   ALLERGIES   Patient has no known allergies.    REVIEW OF SYSTEMS   Unable to obtain due to anoxic brain inury  PHYSICAL EXAMINATION   Vital Signs: Temp:  [97.9 F (36.6 C)-98.9 F (37.2 C)] 97.9 F (36.6 C) (02/07 0400) Pulse Rate:  [94-123] 102 (02/07 0800) Resp:  [11-34] 30 (02/07 0900) BP: (107-180)/(62-106) 144/87 (02/07 0935) SpO2:  [98 %-100 %] 100 % (02/07 0800) FiO2 (%):  [28 %] 28 % (02/07 0800) Weight:  [84.3 kg] 84.3 kg (02/07 0500)    GENERAL: awake, alert, fidgety, impulsive.  Follows simple commands. EYES: Pupils equal, round, reactive to light.  No scleral icterus.  NECK:s/p trach PULMONARY: ctab with minimal rhonchi today CARDIOVASCULAR: S1 and S2. Regular rate and rhythm. No murmurs, rubs, or gallops.  GASTROINTESTINAL: Soft, nontender, -distended.  Positive bowel sounds.   MUSCULOSKELETAL: No swelling, clubbing,  or edema.  NEUROLOGIC: more alert and awake SKIN:intact,warm,dry   PERTINENT DATA   Hemodynamic parameters:   Intake/Output: 02/06 0701 - 02/07 0700 In: 1500 [NG/GT:200] Out: 300 [Urine:300]  Ventilator  Settings: FiO2 (%):  [28 %] 28 %    LAB RESULTS:  Basic Metabolic Panel: Recent Labs  Lab 12/26/20 0619 12/27/20 0500 12/28/20 0645 12/29/20 0404 12/30/20 0416  NA 141 140 143 140 142  K 3.8 3.7 4.3 4.2 3.7  CL 104 104 104 103 103  CO2 _0 GLUCOSE 149* 131* 127* 157* 129*  BUN 24* 20 23* 19 16  CREATININE 0.64 0.49 0.50 0.49 0.59  CALCIUM 9.2 9.5 9.6 9.3 9.3  MG 2.2 1.9 2.0 1.8 2.0  PHOS 4.6 3.6 4.9* 3.8  4.0   Liver Function Tests: Recent Labs  Lab 12/26/20 0619 12/27/20 0500 12/28/20 0645 12/29/20 0404 12/30/20 0416  ALBUMIN 2.6* 3.0* 2.7* 2.8* 2.9*   No results for input(s): LIPASE, AMYLASE in the last 168 hours. No results for input(s): AMMONIA in the last 168 hours. CBC: Recent Labs  Lab 12/26/20 0619 12/27/20 0500 12/28/20 0645 12/29/20 0404 12/30/20 0416  WBC 7.3 8.7 8.7 8.8 6.9  NEUTROABS 5.0 5.7 5.8 6.0 4.6  HGB 8.7* 9.5* 9.6* 9.7* 10.0*  HCT 28.5* 32.1* 32.3* 32.1* 32.0*  MCV 86.1 85.1 85.2 85.6 85.6  PLT 377 392 343 333 310   Cardiac Enzymes: No results for input(s): CKTOTAL, CKMB, CKMBINDEX, TROPONINI in the last 168 hours. BNP: Invalid input(s): POCBNP CBG: Recent Labs  Lab 12/29/20 2032 12/29/20 2324 12/30/20 0422 12/30/20 0725 12/30/20 1119  GLUCAP 105* 162* 125* 120* 135*       IMAGING RESULTS:    ASSESSMENT AND PLAN      55 y.o.obese F admitted for acute cardiac arrest due to Severe acidosis with DKAwith hx of HTN p/w myoclonic jerking and unresponsiveness following cardiac arrest with unknown down time, initial rhythm of Vfib, prolonged ACLS before ROSC, now s/p hypothermia protocol. MRI Brain consistent with diffuse hypoxic/anoxic injury- brain damage s/p trach and PEG  Acute anoxic brain Injury    - Due to cardiac arrest   -s/p neurology evaluation - appreciate input -   - s/p serial brain imaging with most recent as below  -                     IMPRESSION: Findings consistent with anoxic brain injury. 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 Evolutionary changes likely related to involving ischemia compared with the prior MRI of 12/07/2020. Diffusion-weighted signal is less apparent on the current study compared to the prior study. Correlate with neurologic exam.          -would benefit from inpatient rehab   Acute cardiac arrest  -  S/p ACLS with ROSC -s/p  TTM  DKA -s/p full scope of treatment     - resolved DKA   Severe ACUTE Hypoxic and Hypercapnic Respiratory Failure    - due to cardiac arrest    - s/p tracheostomy    - for inpatient rehab placement -continue Bronchodilator Therapy as needed -Off of vent for 4 days now -Trach care  ID -Purulent secretions, cultured -follow up cultures  GI/Nutrition GI PROPHYLAXIS as indicated DIET-->TF's as tolerated Constipation protocol as indicated  ENDO - ICU hypoglycemic\Hyperglycemia protocol -check FSBS per protocol   ELECTROLYTES -follow labs as needed -replace as needed -pharmacy consultation  DVT/GI PRX ordered -SCDs  TRANSFUSIONS AS NEEDED MONITOR FSBS ASSESS the need for LABS as needed   Discussed during multidisciplinary rounds.  Currently stepdown status, consider transition to MedSurg in the morning if remains stable.  Will contact [facility] to assume care in the morning.    Renold Don, MD [facility]   *This note was dictated using voice recognition software/Dragon.  Despite best efforts to proofread, errors can occur which can change the meaning.  Any change was purely unintentional.

## 2020-12-30 NOTE — Progress Notes (Signed)
Shift Note: Pt alert and made efforts to respond to simple questions. Unable to assess orientation. Nodded yes/no to simple questions, followed simple commands as able. Denied pian or discomfort but did have periods of restlessness. No seizure activity noted this shift. intermittent elevated BP's, PRN BP meds given as appropriate X1 with good results. Pt able to move around in bed as desired. Frequently repositioned due to being too low in bed. Favored lying on her right side this shift. Double lumen PICC patent flushed.

## 2020-12-30 NOTE — Progress Notes (Addendum)
Subjective: Minimally interactive but attends to examiner, denies pain  Nursing reports no more waxing / waning and pain has been well controlled  Relevant medications include Clonazepam 1 mg twice daily scheduled Versed 2 mg every 1 hour as needed (received 2 doses 2/6 evening at 2022 and 2224, 1 dose at 0350 this morning) Oxycodone 10 mg every 6 hours scheduled Keppra 1000 mg twice daily (dose increased from 500 mg twice daily 2/5 evening) Seroquel 25 mg nightly Clonidine 0.1 mg every 6 hours as needed blood pressure greater than 140/90 (last dose 2/7 3:50 AM) Please see EMR for full list of medications  Exam: Vitals:   12/30/20 0400 12/30/20 0500  BP: (!) 144/80 (!) 138/96  Pulse: (!) 115 (!) 119  Resp: (!) 25 (!) 24  Temp: 97.9 F (36.6 C)   SpO2: 100% 100%   Gen: In bed, trach in place Resp: non-labored breathing, no acute distress Abd: soft, nt  Neuro: MS: awake, follows commands slowly (stick out tongue, does not squeeze fingers, show thumb or follow other commands) CN: blinks to threat in all quadrants reliably, pupils equal round reactive 3 -> 2 mm, right facial droop. Motor: She has bilateral weakness, on noxious stim symmetric but spontaneously moves LUE more than other parts of the body.  Sensory: Equally responsive to stim in all 4 extremities   Pertinent results: Creatinine 0.59 Anemia with elevated RDW  Lab Results  Component Value Date   [id number] 518 12/19/2020   Lab Results  Component Value Date   TSH 0.935 02/20/2013   EKG QTc 497 on Jan 19th 2022   EEG 1/31: This study issuggestive of moderate diffuse encephalopathy, nonspecific etiology.Sharp transients were seen in bilateral frontocentral region only during sleep and are most likely atypical vertex waves. No seizures were seen throughout the recording.  EEG 1/10  And 1/12 -- Continuous slow, generalized  Impression: 55 yo  F s/p Vfib cardiac arrest on 1/10 s/p hypothermia, s/p trach/PEG  planned for LTAC. She had significant changes on MRI after cardiac arrest (1/12, with expected evolution on 1/30 and 1/31). She had an episode of facial twitching and keppra has been started.  With her waxing and waning described by the nurse (as well as intermittent right face/arm/leg weakness), we were concerned for seizures, and increased keppra. She is now stable and expect to continue slow improvement to a plateau significantly below prior baseline. May optimize medications as below, to be managed by primary team.  Recommendations:  # Concern for seizures - continue Keppra 1 g twice daily - continue clonazepam as this may contribute to seizure control as well - Repeat EEG today, will follow-up this study to rule out NCSE (unlikely but given c/f seizures and sharp transients on prior EEG, will rule this out); if this study is negative, neurology follow-up outpatient  # Agitation management - consider agitation management with clonidine to minimize PRN benzos  - repeat EKG for QTc to confirm safe to continue seroquel, discontinue if QTc prolonged - wean oxycodone from 10 mg q6hr to 5 mg q6hr, further weaning per primary team; this may be suppressing her mental status and can contribute to delirium especially in combination with benzos   Given patient has remained stable, no further neuro workup, neurology available on an as-needed basis going forward  Lesleigh Noe MD-PhD [facility] [phone]  35 minutes spent in care of this patient today   [facility] coverage for [facility] is from 8 AM to 4 AM in-house and  4 PM to 8 PM by telephone/video. 8 PM to 8 AM emergent questions or overnight urgent questions should be addressed to Teleneurology On-call or [facility] neurohospitalist; contact information can be found on AMION

## 2020-12-31 ENCOUNTER — Inpatient Hospital Stay: Payer: Self-pay

## 2020-12-31 LAB — CBC WITH DIFFERENTIAL/PLATELET
Abs Immature Granulocytes: 0.01 10*3/uL (ref 0.00–0.07)
Basophils Absolute: 0 10*3/uL (ref 0.0–0.1)
Basophils Relative: 1 %
Eosinophils Absolute: 0.4 10*3/uL (ref 0.0–0.5)
Eosinophils Relative: 6 %
HCT: 33.5 % — ABNORMAL LOW (ref 36.0–46.0)
Hemoglobin: 10 g/dL — ABNORMAL LOW (ref 12.0–15.0)
Immature Granulocytes: 0 %
Lymphocytes Relative: 30 %
Lymphs Abs: 2 10*3/uL (ref 0.7–4.0)
MCH: 25.6 pg — ABNORMAL LOW (ref 26.0–34.0)
MCHC: 29.9 g/dL — ABNORMAL LOW (ref 30.0–36.0)
MCV: 85.9 fL (ref 80.0–100.0)
Monocytes Absolute: 0.5 10*3/uL (ref 0.1–1.0)
Monocytes Relative: 8 %
Neutro Abs: 3.5 10*3/uL (ref 1.7–7.7)
Neutrophils Relative %: 55 %
Platelets: 273 10*3/uL (ref 150–400)
RBC: 3.9 MIL/uL (ref 3.87–5.11)
RDW: 19.7 % — ABNORMAL HIGH (ref 11.5–15.5)
WBC: 6.4 10*3/uL (ref 4.0–10.5)
nRBC: 0 % (ref 0.0–0.2)

## 2020-12-31 LAB — RENAL FUNCTION PANEL
Albumin: 2.9 g/dL — ABNORMAL LOW (ref 3.5–5.0)
Anion gap: 12 (ref 5–15)
BUN: 21 mg/dL — ABNORMAL HIGH (ref 6–20)
CO2: 28 mmol/L (ref 22–32)
Calcium: 9.8 mg/dL (ref 8.9–10.3)
Chloride: 100 mmol/L (ref 98–111)
Creatinine, Ser: 0.53 mg/dL (ref 0.44–1.00)
GFR, Estimated: >60 mL/min (ref >60)
Glucose, Bld: 138 mg/dL — ABNORMAL HIGH (ref 70–99)
Phosphorus: 4.7 mg/dL — ABNORMAL HIGH (ref 2.5–4.6)
Potassium: 4.7 mmol/L (ref 3.5–5.1)
Sodium: 140 mmol/L (ref 135–145)

## 2020-12-31 LAB — GLUCOSE, CAPILLARY
Glucose-Capillary: 110 mg/dL — ABNORMAL HIGH (ref 70–99)
Glucose-Capillary: 132 mg/dL — ABNORMAL HIGH (ref 70–99)
Glucose-Capillary: 122 mg/dL — ABNORMAL HIGH (ref 70–99)
Glucose-Capillary: 169 mg/dL — ABNORMAL HIGH (ref 70–99)
Glucose-Capillary: 114 mg/dL — ABNORMAL HIGH (ref 70–99)
Glucose-Capillary: 150 mg/dL — ABNORMAL HIGH (ref 70–99)

## 2020-12-31 LAB — MAGNESIUM: Magnesium: 2.1 mg/dL (ref 1.7–2.4)

## 2020-12-31 IMAGING — DX DG ABDOMEN 1V
2 series · 2 of 2 positions shown · non-contrast
Comparison: None.

CLINICAL DATA: Peg tube placement

EXAM:
ABDOMEN - 1 VIEW

[abdomen supine (1 of 2)]
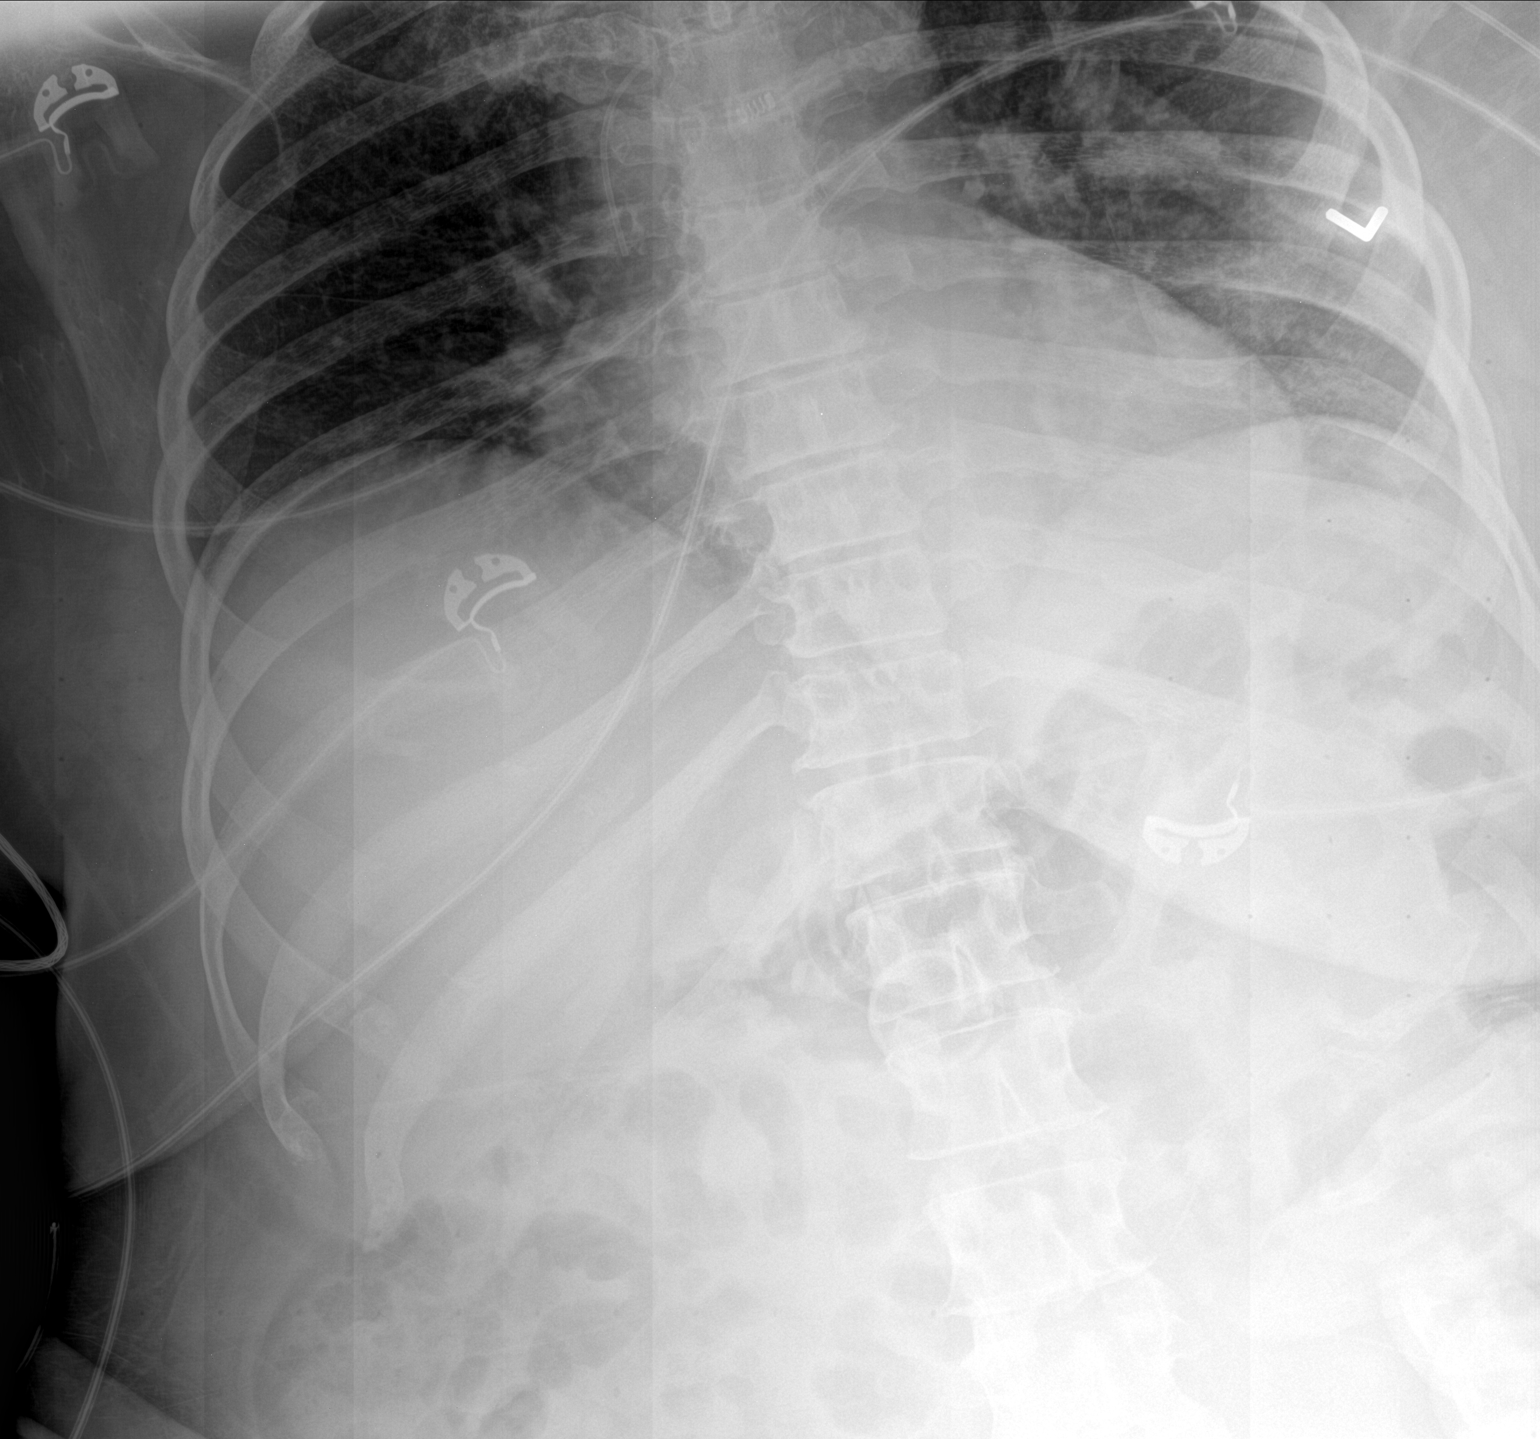

[abdomen supine (2 of 2)]
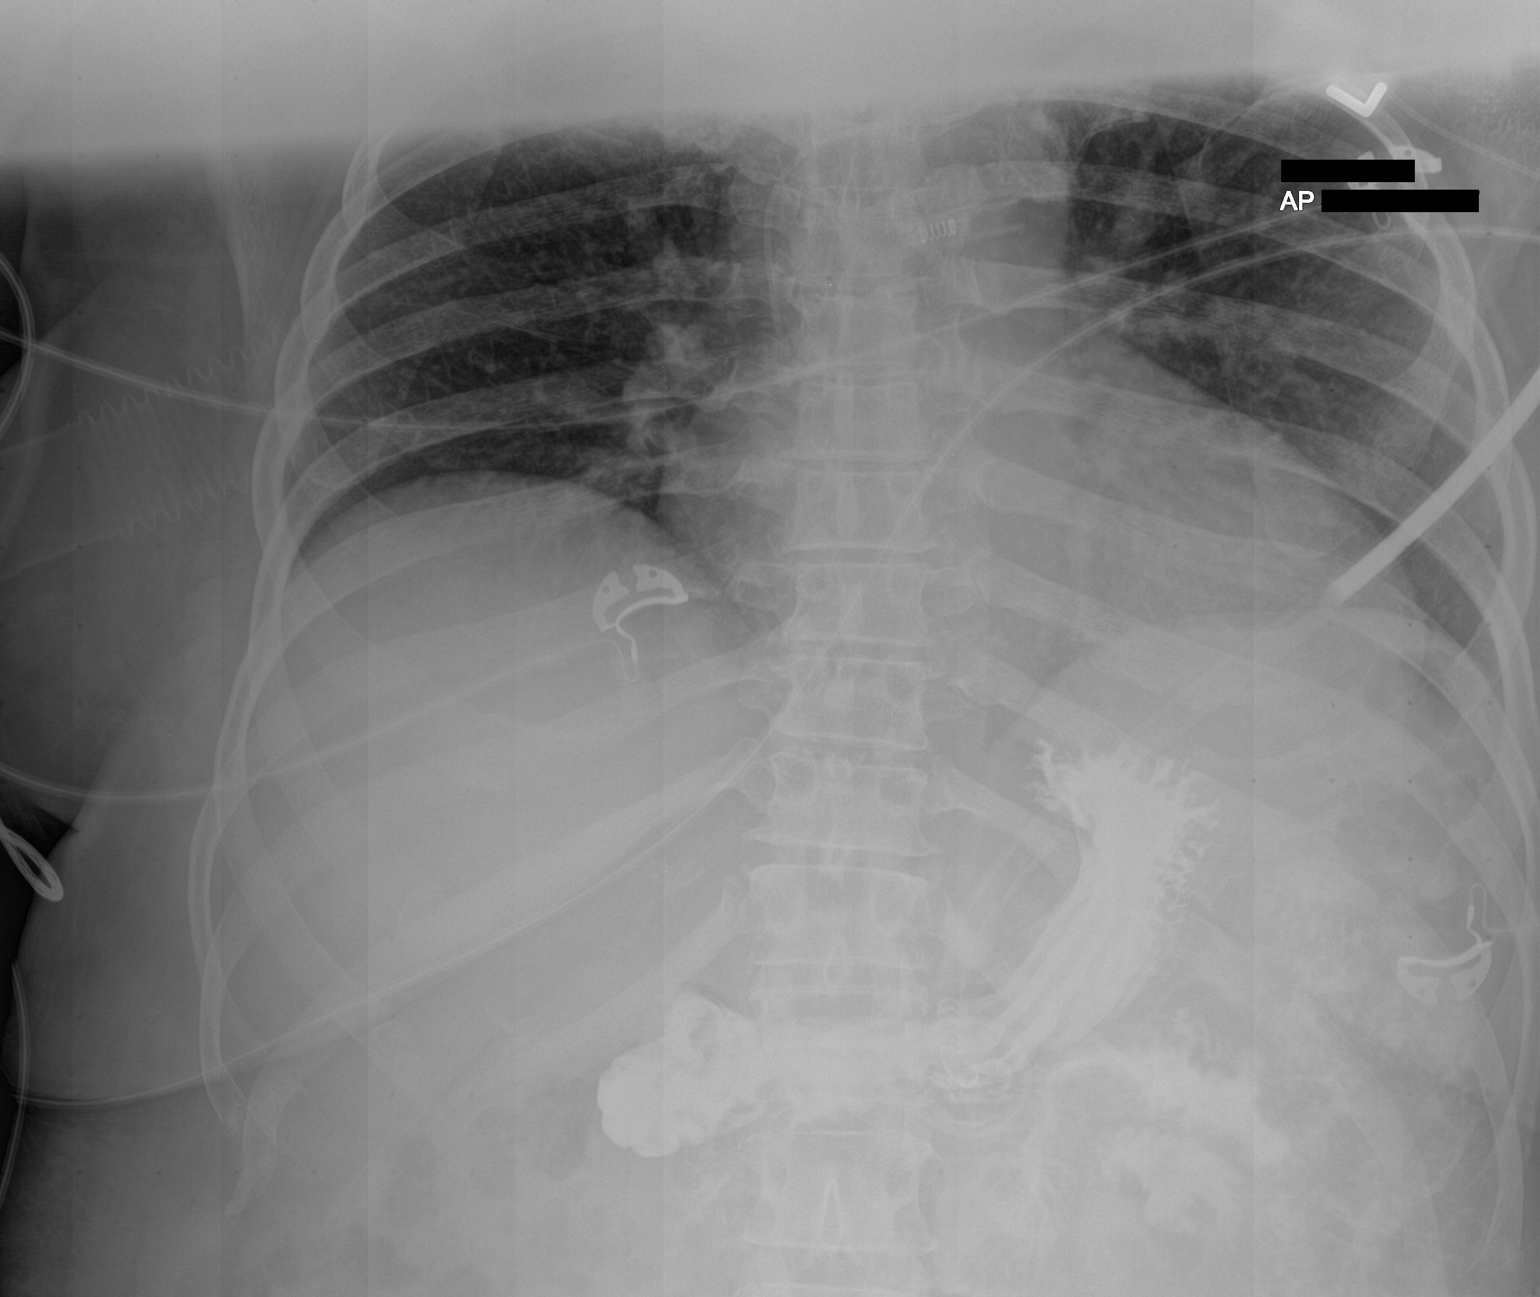

[2 of 2 positions shown; findings below may reference images not displayed]

FINDINGS: Two frontal views of the lower chest and upper abdomen demonstrate
contrast material injected through indwelling PEG tube outlining the
gastric rugal folds. No evidence of contrast extravasation. Bowel
gas pattern is unremarkable.
IMPRESSION: 1. Peg tube within the gastric lumen as above.

## 2020-12-31 MED ORDER — ENOXAPARIN SODIUM 40 MG/0.4ML ~~LOC~~ SOLN
40.0000 mg | SUBCUTANEOUS | Status: DC
  Administered 2020-12-31 – 2021-01-05 (×6): 40 mg via SUBCUTANEOUS
  Filled 2020-12-31 (×6): qty 0.4

## 2020-12-31 MED ORDER — DIATRIZOATE MEGLUMINE & SODIUM 66-10 % PO SOLN
30.0000 mL | Freq: Once | ORAL | Status: AC
  Administered 2020-12-31: 30 mL via JEJUNOSTOMY

## 2020-12-31 MED ORDER — INSULIN GLARGINE 100 UNIT/ML ~~LOC~~ SOLN
5.0000 [IU] | Freq: Every day | SUBCUTANEOUS | Status: DC
  Administered 2020-12-31 – 2021-01-07 (×8): 5 [IU] via SUBCUTANEOUS
  Filled 2020-12-31 (×10): qty 0.05

## 2020-12-31 MED ORDER — BISACODYL 10 MG RE SUPP: 10.0000 mg | Freq: Every day | RECTAL | Status: DC | PRN

## 2020-12-31 MED ORDER — ONDANSETRON HCL 4 MG/2ML IJ SOLN: 4.0000 mg | Freq: Four times a day (QID) | INTRAMUSCULAR | Status: DC | PRN

## 2020-12-31 NOTE — Progress Notes (Signed)
PROGRESS NOTE    [name] [name]  FVC:[id number] DOB: Jun 07, 1966 DOA: 12/02/2020 PCP: Marinda Elk, MD  Outpatient Specialists: none    Brief Narrative:   From PCCM progress note at time of transfer to hospitalist service:  [name] with acute cardiac arrest Labs c/w severe acidosis and severe DKA Patient with witnessed V fib arrest and shocked in the field ROSC returned patient admitted to MICU  1/10 admitted to ICU for cardiac arrest, started hypothermia protocol 1/11 hypothermia protocol +posturing 1/12 signs of brain damage, needs NEURO consultation 1/13 failed SAT/SBT, unable to follow commands 1/14- for SBT today, electrolytes are improved. Encephalopathic working on mental status. 12/07/20-Patient remains crtically ill and is with interval worsening MRI brain, I met with husband to briefly review. Discussed case with Neurologist today and there is plan for meeting with family. 12/14/20- patient for LTAC placement s/p PEG/trache. No events today. 1/23severe brain damage 1/24 severe Brain Damage, severe agitation 1/25 severe brain damage 1/27 bouts ofventilator asynchrony 1/30 focal seizure activity around mouth noted started on Keppra neurologyre consulted 1/31severe agitation, TCT 1 hr 2/2 severe agitation, remains on vent, TCT for 12 hrs 12/27/2020 - patient with accelerated HTN during exam this am, shes moving extermitiesx4 but encephalopathic. 12/28/2020-  Patient unable to communicate and is s/p trache and PEG, no acute events overnight. 12/29/2020- Discussed case with neurologist, s/p re-eval, seems that there may be intermittent periods of possible seizure activity and we discussed possibly needing transfer out to [facility] due to seizure activity.  Met with husband at bedside today. 12/30/2020-has been off vent for 4 days, currently stepdown status, transition to [place] with eventual transfer to: Inpatient rehab.   Assessment & Plan:   Active Problems:    Cardiac arrest ([place])   Acute respiratory failure ([facility])   Hypoxic ischemic encephalopathy   Endotracheal tube present  55 y.o.obese F admitted for acute cardiac arrest due to Severe acidosis with DKAwith hx of HTN p/w myoclonic jerking and unresponsiveness following cardiac arrest with unknown down time, initial rhythm of Vfib, prolonged ACLS before ROSC, now s/p hypothermia protocol. MRI Brain consistent with diffuse hypoxic/anoxic injury- brain damage s/p trach and PEG  # Acute anoxic brain Injury  # Concern for seizure Due to cardiac arrest. Neuro following. EEG 2/7 w/o signs seizure, consistent w/ diffuse encephalopathy. MRI shows signs anoxic brain injury. - continue keppra, clonazepam - wean oxycodone, down to 5 q6 yesterday, will slowly taper - f/u EEG to assess qtc, d/c seroquel if qtc prolonged - PT/OT have evaluated, advise inpatient rehab, Shann Medal of inpatient rehab has seen and is following - clonidine for agitation  # DKA Resolved. Required 10 units of sliding scale yesterday - start lantus 5 - continue SSI    - resolved DKA  # Cardiac arrest - continue statin  # Tube feeding PEG in place - RD consult - continue tube feeds, free water 200 q6  # Severe hypoxic hypercapnic respiratory failure # Tracheostomy Respiratory failure 2/2 cardiac arrest. Off ventilator. - cont trach care  # HTN Here bp mildly elevated - cont amlodipine 10  # Lines Has left arm picc    DVT prophylaxis: lovenox Code Status: partial - intubation but no cpr Family Communication: none @ bedside  Level of care: Stepdown Status is: Inpatient  Remains inpatient appropriate because:Inpatient level of care appropriate due to severity of illness   Dispo: The patient is from: Home  Anticipated d/c is to: inpatient rehab              Anticipated d/c date is: pending acceptance to inpatient rehab              Patient currently is not medically stable to d/c.    Difficult to place patient No        Consultants:  neurology    Subjective: This morning awake, nods head when answered questions, doesn't follow commands.  Objective: Vitals:   12/31/20 0500 12/31/20 0739 12/31/20 0800 12/31/20 0900  BP: 105/61 (!) 141/87 (!) 163/98 (!) 157/100  Pulse: 79 (!) 101 (!) 110 (!) 107  Resp: 20 (!) 30 (!) 22 (!) 23  Temp:  98.9 F (37.2 C)    TempSrc:  Oral    SpO2: 97% 95% 96% 96%  Weight:      Height:        Intake/Output Summary (Last 24 hours) at 12/31/2020 1022 Last data filed at 12/31/2020 0807 Gross per 24 hour  Intake 230 ml  Output 300 ml  Net -70 ml   Filed Weights   12/27/20 0436 12/28/20 0500 12/30/20 0500  Weight: 84.7 kg 85 kg 84.3 kg    Examination:  General exam: Appears calm and comfortable, chronically ill. Trach. Respiratory system: Clear to auscultation. Respiratory effort normal. Cardiovascular system: S1 & S2 heard, RRR. No JVD, murmurs, rubs, gallops or clicks. Trace LE edema Gastrointestinal system: Abdomen is nondistended, soft and nontender. No organomegaly or masses felt. PEG tube Central nervous system: awake, moves all extremities Skin: No visible lesions Psychiatry: unable to assess    Data Reviewed: I have personally reviewed following labs and imaging studies  CBC: Recent Labs  Lab 12/27/20 0500 12/28/20 0645 12/29/20 0404 12/30/20 0416 12/31/20 0602  WBC 8.7 8.7 8.8 6.9 6.4  NEUTROABS 5.7 5.8 6.0 4.6 3.5  HGB 9.5* 9.6* 9.7* 10.0* 10.0*  HCT 32.1* 32.3* 32.1* 32.0* 33.5*  MCV 85.1 85.2 85.6 85.6 85.9  PLT 392 343 333 310 335   Basic Metabolic Panel: Recent Labs  Lab 12/27/20 0500 12/28/20 0645 12/29/20 0404 12/30/20 0416 12/31/20 0602  NA 140 143 140 142 140  K 3.7 4.3 4.2 3.7 4.7  CL 104 104 103 103 100  CO2 _0 GLUCOSE 131* 127* 157* 129* 138*  BUN 20 23* 19 16 21*  CREATININE 0.49 0.50 0.49 0.59 0.53  CALCIUM 9.5 9.6 9.3 9.3 9.8  MG 1.9 2.0 1.8 2.0 2.1  PHOS  3.6 4.9* 3.8 4.0 4.7*   GFR: Estimated Creatinine Clearance: 79.2 mL/min (by C-G formula based on SCr of 0.53 mg/dL). Liver Function Tests: Recent Labs  Lab 12/27/20 0500 12/28/20 0645 12/29/20 0404 12/30/20 0416 12/31/20 0602  ALBUMIN 3.0* 2.7* 2.8* 2.9* 2.9*   No results for input(s): LIPASE, AMYLASE in the last 168 hours. No results for input(s): AMMONIA in the last 168 hours. Coagulation Profile: No results for input(s): INR, PROTIME in the last 168 hours. Cardiac Enzymes: No results for input(s): CKTOTAL, CKMB, CKMBINDEX, TROPONINI in the last 168 hours. BNP (last 3 results) No results for input(s): PROBNP in the last 8760 hours. HbA1C: No results for input(s): HGBA1C in the last 72 hours. CBG: Recent Labs  Lab 12/30/20 1718 12/30/20 1911 12/30/20 2305 12/31/20 0308 12/31/20 0727  GLUCAP 115* 186* 165* 110* 132*   Lipid Profile: No results for input(s): CHOL, HDL, LDLCALC, TRIG, CHOLHDL, LDLDIRECT in the last 72 hours. Thyroid Function Tests:  No results for input(s): TSH, T4TOTAL, FREET4, T3FREE, THYROIDAB in the last 72 hours. Anemia Panel: No results for input(s): VITAMINB12, FOLATE, FERRITIN, TIBC, IRON, RETICCTPCT in the last 72 hours. Urine analysis:    Component Value Date/Time   COLORURINE YELLOW (A) 12/10/2020 1430   APPEARANCEUR HAZY (A) 12/10/2020 1430   LABSPEC 1.026 12/10/2020 1430   PHURINE 5.0 12/10/2020 1430   GLUCOSEU NEGATIVE 12/10/2020 1430   HGBUR NEGATIVE 12/10/2020 1430   BILIRUBINUR NEGATIVE 12/10/2020 1430   KETONESUR NEGATIVE 12/10/2020 1430   PROTEINUR 30 (A) 12/10/2020 1430   NITRITE NEGATIVE 12/10/2020 1430   LEUKOCYTESUR TRACE (A) 12/10/2020 1430   Sepsis Labs: _0 (procalcitonin:4,lacticidven:4)  )No results found for this or any previous visit (from the past 240 hour(s)).       Radiology Studies: EEG  Result Date: 12/30/2020 Lora Havens, MD     12/30/2020  4:28 PM Patient Name: [name] [name] MRN: [id number]  Epilepsy Attending: Lora Havens Referring Physician/Provider: Dr. Lesleigh Noe Date: 12/30/2020 Duration: 24.12 mins Patient history: 55 year old female status post cardiac arrest with an episode of facial twitching.  EEG to evaluate for seizures. Level of alertness: Awake AEDs during EEG study: Keppra, Klonopin Technical aspects: This EEG study was done with scalp electrodes positioned according to the 10-20 International system of electrode placement. Electrical activity was acquired at a sampling rate of _1  and reviewed with a high frequency filter of _2  and a low frequency filter of _3 . EEG data were recorded continuously and digitally stored. Description: No posterior dominant rhythm was seen.  EEG showed continuous generalized 3 to 6 Hz theta-delta slowing admixed with 15-_4  generalized beta activity. Physiologic photic driving was not seen during photic stimulation. Hyperventilation was not performed.   IMPRESSION: This study issuggestive of moderate diffuse encephalopathy, nonspecific etiology. No seizures and epileptiform discharges were seen throughout the recording. Priyanka Barbra Sarks        Scheduled Meds: . sodium chloride   Intravenous Once  . alteplase  2 mg Intracatheter Once  . amLODipine  10 mg Per Tube Daily  . atorvastatin  80 mg Per Tube Daily  . chlorhexidine gluconate (MEDLINE KIT)  15 mL Mouth Rinse BID  . Chlorhexidine Gluconate Cloth  6 each Topical Daily  . clonazePAM  1 mg Per Tube BID  . feeding supplement ([employer] STANDARD 1.4)  325 mL Per Tube QID  . free water  200 mL Per Tube QID  . heparin  5,000 Units Subcutaneous Q8H  . insulin aspart  0-15 Units Subcutaneous Q4H  . levETIRAcetam  1,000 mg Per Tube BID  . mouth rinse  15 mL Mouth Rinse 10 times per day  . multivitamin with minerals  1 tablet Per Tube Daily  . oxyCODONE  5 mg Per Tube Q6H  . pantoprazole sodium  40 mg Per Tube QHS  . polyethylene glycol  17 g Per Tube Daily  . QUEtiapine  25  mg Per Tube QHS  . senna-docusate  1 tablet Per Tube Daily  . sodium chloride flush  10-40 mL Intracatheter Q12H   Continuous Infusions: . sodium chloride 250 mL (12/19/20 0449)     LOS: 29 days    Time spent: 70 min    Desma Maxim, MD [facility]   If 7PM-7AM, please contact night-coverage [email or web address] Password [facility] 12/31/2020, 10:22 AM

## 2020-12-31 NOTE — Progress Notes (Signed)
Inpatient Rehab Admissions Coordinator:   I have no beds available for this patient to admit to CIR today.  Will continue to follow for timing of potential admission pending bed availability, hopefully in the next 2-3 days.   Shann Medal, PT, DPT Admissions Coordinator [phone] 12/31/20  1:57 PM

## 2020-12-31 NOTE — Progress Notes (Signed)
Physical Therapy Treatment Patient Details Name: [name] [name] MRN: [id number] DOB: [birth year] Today's Date: 12/31/2020    History of Present Illness Pt is a 55 year old female presented to the ED via EMS post witnessed cardiac arrest. ROSC achieved following 3 defibrillations and one round of epinephrine. Patient found to have significant hypokalemia on arrival. Given witnessed arrest, patient Code ICE.  S/p trach and peg tube placement.  Workup showed anoxic brain injury. Pt also showed facial twitching that could be myoclonic versus partial seizure.    PT Comments    Patient alert, much more communicative today with nods or head shakes and able to attend to PT and visually track with cueing for majority of session as well. Pt displayed improved sitting balance and upright positioning this session. Supine to sit maxA, pt able to initiate LE movement towards EOB. Once positioned in midline in sitting, pt able to maintain with CGA, occasional minA to return to midline (R lateral lean noted). Several exercises performed in sitting EOB such as UE PNF patterns, righting reactions all directions, L shoulder extension/retraction. Pt also able to indicate if she became fatigued when asked by PT. Pt able to sit EOB for at least 20 minutes this session. Returned to supine and positioned in chair position, pt able to smile at PT and nod when PT left the room. Pt continues to demonstrate excellent motivation and effort with PT services, recommendation remains appropriate.    Follow Up Recommendations  CIR     Equipment Recommendations  Other (comment) (TBD)    Recommendations for Other Services OT consult;Rehab consult     Precautions / Restrictions Precautions Precautions: Fall Precaution Comments: trach collar 5 L Restrictions Weight Bearing Restrictions: No    Mobility  Bed Mobility Overal bed mobility: Needs Assistance Bed Mobility: Supine to Sit;Sit to Supine     Supine to sit: Max  assist;HOB elevated Sit to supine: Max assist;+2 for safety/equipment      Transfers                    Ambulation/Gait                 Stairs             Wheelchair Mobility    Modified Rankin (Stroke Patients Only)       Balance Overall balance assessment: Needs assistance   Sitting balance-Leahy Scale: Fair Sitting balance - Comments: varying levels of seated balance throughout ranging from min guard - min A Postural control: Right lateral lean                                  Cognition Arousal/Alertness: Awake/alert Behavior During Therapy: Flat affect Overall Cognitive Status: Difficult to assess                                 General Comments: pt non verbal, pt is cooperative, able to follow commands with extended time; improved yes/no nods noted as well      Exercises Other Exercises Other Exercises: Majority of session spent in sitting, improved balance noted, able to prop with RLE (total assist for placement). once positioned in midline, pt CGA for majority of session for balance Other Exercises: seated exercises such as PNF pattern 1 to both UEs, pt minimally able to assist with LUE. PROM RUE, righting  reactions, eye tracking of UEs    General Comments        Pertinent Vitals/Pain Pain Assessment: Faces Faces Pain Scale: No hurt    Home Living                      Prior Function            PT Goals (current goals can now be found in the care plan section) Progress towards PT goals: Progressing toward goals    Frequency    Min 2X/week      PT Plan Current plan remains appropriate    Co-evaluation              AM-PAC PT "6 Clicks" Mobility   Outcome Measure  Help needed turning from your back to your side while in a flat bed without using bedrails?: Total Help needed moving from lying on your back to sitting on the side of a flat bed without using bedrails?: Total Help  needed moving to and from a bed to a chair (including a wheelchair)?: Total Help needed standing up from a chair using your arms (e.g., wheelchair or bedside chair)?: Total Help needed to walk in hospital room?: Total Help needed climbing 3-5 steps with a railing? : Total 6 Click Score: 6    End of Session Equipment Utilized During Treatment: Oxygen Activity Tolerance: Patient tolerated treatment well Patient left: in bed;with call bell/phone within reach;Other (comment) (bed in chair position) Nurse Communication: Mobility status PT Visit Diagnosis: Other abnormalities of gait and mobility (R26.89);Muscle weakness (generalized) (M62.81);Difficulty in walking, not elsewhere classified (R26.2);Other symptoms and signs involving the nervous system (A19.379)     Time: 0240-9735 PT Time Calculation (min) (ACUTE ONLY): 32 min  Charges:  $Neuromuscular Re-education: 23-37 mins                     Lieutenant Diego PT, DPT 10:47 AM,12/31/20

## 2020-12-31 NOTE — Progress Notes (Signed)
Occupational Therapy Treatment Patient Details Name: [name] [name] MRN: [id number] DOB: [birth year] Today's Date: 12/31/2020    History of present illness Pt is a 55 year old female presented to the ED via EMS post witnessed cardiac arrest. ROSC achieved following 3 defibrillations and one round of epinephrine. Patient found to have significant hypokalemia on arrival. Given witnessed arrest, patient Code ICE.  S/p trach and peg tube placement.  Workup showed anoxic brain injury. Pt also showed facial twitching that could be myoclonic versus partial seizure.   OT comments  Upon entering the room, pt supine in bed with husband present in the room. Pt attempting to mouth greeting to therapist this session. Pt performs bridge in bed x 3 reps with increased time to initiate to adjust pads. Supine >sit with max A to EOB. Husband sitting next to pt on bed with pt needing min A for sitting balance and focus on table sides with L UE. Pt able to perform shoulder flexion on table x 2 reps with increased time to initiate ( 2-4 minutes with mod multimodal cuing). Pt able to perform internal rotation as well x 3 reps but unable to return to neutral position. Pt giving visible effort throughout. OT propping B UEs onto table and pt then able to actively lean L to touch obstacle, return to midline, and then lean right and return to midline. Again, pt needing increased time to follow commands. Pt needing cuing to keep head upright but making great progress and performing action without assistance this session.   Pt returning back to bed at end of session with +2 assist secondary to fatigue. Pt repositioned with assistance from husband. All needs within reach and NT arrived for further assistance. Pt continues to make progress towards occupational therapy goals with recommendation for intensive CIR at discharge to continue to address functional deficits.    Follow Up Recommendations  CIR    Equipment Recommendations   Other (comment) (defer to next venue of care)       Precautions / Restrictions Precautions Precautions: Fall Precaution Comments: trach collar 5 L       Mobility Bed Mobility Overal bed mobility: Needs Assistance Bed Mobility: Supine to Sit;Sit to Supine     Supine to sit: Max assist;HOB elevated Sit to supine: Max assist;+2 for safety/equipment   General bed mobility comments: increased assist to return to bed secondary to fatigue  Transfers                 General transfer comment: not attempted this session    Balance Overall balance assessment: Needs assistance   Sitting balance-Leahy Scale: Fair Sitting balance - Comments: varying levels of seated balance throughout ranging from min guard - min A                                   ADL either performed or assessed with clinical judgement        Vision Patient Visual Report: No change from baseline     Perception     Praxis      Cognition Arousal/Alertness: Awake/alert Behavior During Therapy: Flat affect Overall Cognitive Status: Difficult to assess                                 General Comments: Pt is non verbal but cooperative. She follows commands with increased  time. She does try to "mouth" words and nods head yes/no                   Pertinent Vitals/ Pain       Pain Assessment: Faces Faces Pain Scale: No hurt         Frequency  Min 3X/week        Progress Toward Goals  OT Goals(current goals can now be found in the care plan section)  Progress towards OT goals: Progressing toward goals  Acute Rehab OT Goals Patient Stated Goal: to improve as much as possible OT Goal Formulation: With family Time For Goal Achievement: 01/09/21 Potential to Achieve Goals: [place] Discharge plan remains appropriate       AM-PAC OT "6 Clicks" Daily Activity     Outcome Measure   Help from another person eating meals?: Total Help from another person  taking care of personal grooming?: Total Help from another person toileting, which includes using toliet, bedpan, or urinal?: Total Help from another person bathing (including washing, rinsing, drying)?: Total Help from another person to put on and taking off regular upper body clothing?: Total Help from another person to put on and taking off regular lower body clothing?: Total 6 Click Score: 6    End of Session Equipment Utilized During Treatment: Oxygen (trach 5L)  OT Visit Diagnosis: Unsteadiness on feet (R26.81);Muscle weakness (generalized) (M62.81)   Activity Tolerance Patient tolerated treatment well   Patient Left in bed;with call bell/phone within reach;with bed alarm set;with family/visitor present   Nurse Communication Mobility status        Time: 2446-2863 OT Time Calculation (min): 41 min  Charges: OT General Charges $OT Visit: 1 Visit OT Treatments $Neuromuscular Re-education: 38-52 mins  Darleen Crocker, MS, OTR/L , CBIS ascom [phone]  12/31/20, 4:44 PM

## 2020-12-31 NOTE — Consult Note (Signed)
Physical Medicine and Rehabilitation Consult Reason for Consult: Anoxic brain injury s/p cardiac arrest Referring Physician: Gwynne Edinger, MD   HPI: [name] [name] is a 55 y.o. female who presented to the ED via EMS post witnessed cardiac arrest. ROSC was achieved following 3 defibrillations and one round of epinephrine. She was fond to have significant hypokalemic on arrival. She is s/p trach and PEG placement. Workup showed anoxic brain injury. Patient also showed facial twitching that were thought to be myoclonic versus partial seizure. 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 Physical Medicine & Rehabilitation was consulted to assess candidacy for CIR.    Review of Systems  Constitutional: Negative.   HENT: Negative.   Eyes: Negative.   Respiratory: Negative.        +secretions  Cardiovascular: Negative.   Gastrointestinal: Negative.   Genitourinary: Negative.   Musculoskeletal: Negative.   Skin: Negative.   Neurological: Positive for weakness.  Endo/Heme/Allergies: Negative.   Psychiatric/Behavioral: Negative.        Altered mental status   Past Medical History:  Diagnosis Date  . Hypertension    Past Surgical History:  Procedure Laterality Date  . PEG PLACEMENT N/A 12/13/2020   Procedure: PERCUTANEOUS ENDOSCOPIC GASTROSTOMY (PEG) PLACEMENT;  Surgeon: Lesly Rubenstein, MD;  Location: [facility] ENDOSCOPY;  Service: Endoscopy;  Laterality: N/A;  . TRACHEOSTOMY TUBE PLACEMENT N/A 12/13/2020   Procedure: TRACHEOSTOMY;  Surgeon: Clyde Canterbury, MD;  Location: [facility] ORS;  Service: ENT;  Laterality: N/A;   Family History  Problem Relation Age of Onset  . Hypertension Mother   . Leukemia Mother   . Diabetes Mother   . Healthy Father    Social History:  reports that she quit smoking about 15 years ago. She has never used smokeless tobacco. She reports that she does not drink alcohol  and does not use drugs. Allergies: No Known Allergies No medications prior to admission.    Home: Home Living Family/patient expects to be discharged to:: Inpatient rehab Living Arrangements: Spouse/significant other,Children Available Help at Discharge: Family,Available 24 hours/day Type of Home: House Additional Comments: Pt's husband and grown children are very dedicated to her care. Pt was independent PTA.  Functional History: Prior Function Level of Independence: Independent Comments: Pt unable to provide PLOF; family to inform OT as able Functional Status:  Mobility: Bed Mobility Overal bed mobility: Needs Assistance Bed Mobility: Supine to Sit,Sit to Supine Supine to sit: Max assist,HOB elevated Sit to supine: Max assist,+2 for safety/equipment Transfers Overall transfer level: Needs assistance Equipment used: 2 person hand held assist Transfers: Sit to/from Stand Sit to Stand: Max assist,+2 physical assistance,+2 safety/equipment General transfer comment: three musketeer style standing from bed. Pt able to come more fully upright, but little activation noted in LEs to weight bear. No steps taken secondary to safety. Ambulation/Gait General Gait Details: unable    ADL: ADL Overall ADL's : Needs assistance/impaired Grooming: Bed level,Total assistance,Wash/dry face Grooming Details (indicate cue type and reason): hand over hand assistance for tasks  Cognition: Cognition Overall Cognitive Status: Difficult to assess Orientation Level: Intubated/Tracheostomy - Unable to assess Cognition Arousal/Alertness: Awake/alert Behavior During Therapy: Flat affect Overall Cognitive Status: Difficult to assess General Comments: pt non verbal, pt is cooperative, able to follow commands with extended time; improved yes/no nods noted as well Difficult to assess due to: Tracheostomy  Blood pressure 127/75, pulse 84, temperature 98.6 F (37 C), temperature  source Oral, resp.  rate (!) 22, height 5' 0.98" (1.549 m), weight 84.3 kg, SpO2 96 %. Physical Exam  Gen: no distress, normal appearing HEENT: +trach, +secretions Cardio: Reg rate Chest: normal effort, normal rate of breathing Abd: soft, non-distended Ext: no edema Psych: pleasant, normal affect Skin: intact Neuro: Makes good eye contact Musculoskeletal: Moving all 4 extremities but unable to follow my commands. Did give husband a kiss when he asked her to.   Results for orders placed or performed during the hospital encounter of 12/02/20 (from the past 24 hour(s))  Glucose, capillary     Status: Abnormal   Collection Time: 12/30/20  5:18 PM  Result Value Ref Range   Glucose-Capillary 115 (H) 70 - 99 mg/dL  Glucose, capillary     Status: Abnormal   Collection Time: 12/30/20  7:11 PM  Result Value Ref Range   Glucose-Capillary 186 (H) 70 - 99 mg/dL  Glucose, capillary     Status: Abnormal   Collection Time: 12/30/20 11:05 PM  Result Value Ref Range   Glucose-Capillary 165 (H) 70 - 99 mg/dL  Glucose, capillary     Status: Abnormal   Collection Time: 12/31/20  3:08 AM  Result Value Ref Range   Glucose-Capillary 110 (H) 70 - 99 mg/dL  CBC with Differential/Platelet     Status: Abnormal   Collection Time: 12/31/20  6:02 AM  Result Value Ref Range   WBC 6.4 4.0 - 10.5 K/uL   RBC 3.90 3.87 - 5.11 MIL/uL   Hemoglobin 10.0 (L) 12.0 - 15.0 g/dL   HCT 33.5 (L) 36.0 - 46.0 %   MCV 85.9 80.0 - 100.0 fL   MCH 25.6 (L) 26.0 - 34.0 pg   MCHC 29.9 (L) 30.0 - 36.0 g/dL   RDW 19.7 (H) 11.5 - 15.5 %   Platelets 273 150 - 400 K/uL   nRBC 0.0 0.0 - 0.2 %   Neutrophils Relative % 55 %   Neutro Abs 3.5 1.7 - 7.7 K/uL   Lymphocytes Relative 30 %   Lymphs Abs 2.0 0.7 - 4.0 K/uL   Monocytes Relative 8 %   Monocytes Absolute 0.5 0.1 - 1.0 K/uL   Eosinophils Relative 6 %   Eosinophils Absolute 0.4 0.0 - 0.5 K/uL   Basophils Relative 1 %   Basophils Absolute 0.0 0.0 - 0.1 K/uL   Immature Granulocytes 0 %   Abs  Immature Granulocytes 0.01 0.00 - 0.07 K/uL  Magnesium     Status: None   Collection Time: 12/31/20  6:02 AM  Result Value Ref Range   Magnesium 2.1 1.7 - 2.4 mg/dL  Renal function panel     Status: Abnormal   Collection Time: 12/31/20  6:02 AM  Result Value Ref Range   Sodium 140 135 - 145 mmol/L   Potassium 4.7 3.5 - 5.1 mmol/L   Chloride 100 98 - 111 mmol/L   CO2 28 22 - 32 mmol/L   Glucose, Bld 138 (H) 70 - 99 mg/dL   BUN 21 (H) 6 - 20 mg/dL   Creatinine, Ser 0.53 0.44 - 1.00 mg/dL   Calcium 9.8 8.9 - 10.3 mg/dL   Phosphorus 4.7 (H) 2.5 - 4.6 mg/dL   Albumin 2.9 (L) 3.5 - 5.0 g/dL   GFR, Estimated >60 >60 mL/min   Anion gap 12 5 - 15  Glucose, capillary     Status: Abnormal   Collection Time: 12/31/20  7:27 AM  Result Value Ref Range   Glucose-Capillary 132 (H)  70 - 99 mg/dL  Glucose, capillary     Status: Abnormal   Collection Time: 12/31/20 10:58 AM  Result Value Ref Range   Glucose-Capillary 122 (H) 70 - 99 mg/dL   EEG  Result Date: 12/30/2020 Lora Havens, MD     12/30/2020  4:28 PM Patient Name: [name] [name] MRN: [id number] Epilepsy Attending: Lora Havens Referring Physician/Provider: Dr. Lesleigh Noe Date: 12/30/2020 Duration: 24.12 mins Patient history: 55 year old female status post cardiac arrest with an episode of facial twitching.  EEG to evaluate for seizures. Level of alertness: Awake AEDs during EEG study: Keppra, Klonopin Technical aspects: This EEG study was done with scalp electrodes positioned according to the 10-20 International system of electrode placement. Electrical activity was acquired at a sampling rate of 500Hz  and reviewed with a high frequency filter of 70Hz  and a low frequency filter of 1Hz . EEG data were recorded continuously and digitally stored. Description: No posterior dominant rhythm was seen.  EEG showed continuous generalized 3 to 6 Hz theta-delta slowing admixed with 15-18hz  generalized beta activity. Physiologic photic driving was not  seen during photic stimulation. Hyperventilation was not performed.   IMPRESSION: This study issuggestive of moderate diffuse encephalopathy, nonspecific etiology. No seizures and epileptiform discharges were seen throughout the recording. Priyanka Barbra Sarks     Assessment/Plan: Diagnosis: Anoxic brain injury following cardiac arrest 1. Does the need for close, 24 hr/day medical supervision in concert with the patient's rehab needs make it unreasonable for this patient to be served in a less intensive setting? Yes 2. Co-Morbidities requiring supervision/potential complications:  1. S/p cardiac arrest 2. Acute respiratory failure- would benefit from TID monitoring of SPO2 and RR.  3. Hypoxic ischemic encephalopathy: would benefit fro SLP- cognitive.  4. S/p endotracheal tube placement 5. Obesity (BMI 25.13) 6. Impaired mobility and ADLs: would benefit from intensive PT and OT 3. Due to bladder management, bowel management, safety, skin/wound care, disease management, medication administration, pain management and patient education, does the patient require 24 hr/day rehab nursing? Yes 4. Does the patient require coordinated care of a physician, rehab nurse, therapy disciplines of PT, OT, SLP to address physical and functional deficits in the context of the above medical diagnosis(es)? Yes Addressing deficits in the following areas: balance, endurance, locomotion, strength, transferring, bowel/bladder control, bathing, dressing, feeding, grooming, toileting, cognition and psychosocial support 5. Can the patient actively participate in an intensive therapy program of at least 3 hrs of therapy per day at least 5 days per week? Yes 6. The potential for patient to make measurable gains while on inpatient rehab is good 7. Anticipated functional outcomes upon discharge from inpatient rehab are min assist  with PT, min assist with OT, min assist with SLP. 8. Estimated rehab length of stay to reach the  above functional goals is: 2-3 weeks 9. Anticipated discharge destination: Home 10. Overall Rehab/Functional Prognosis: good  RECOMMENDATIONS: This patient's condition is appropriate for continued rehabilitative care in the following setting: CIR Patient has agreed to participate in recommended program. Yes Note that insurance prior authorization may be required for reimbursement for recommended care.  Comment: Thank you for this consult. Admission coordinator to follow.   I have personally performed a face to face diagnostic evaluation, including, but not limited to relevant history and physical exam findings, of this patient and developed relevant assessment and plan.  Additionally, I have reviewed and concur with the physician assistant's documentation above.  Izora Ribas, MD 12/31/2020

## 2020-12-31 NOTE — Progress Notes (Signed)
Nutrition Follow-up  DOCUMENTATION CODES:   Obesity unspecified  INTERVENTION:  Continue bolus regimen of [employer] Standard 1.4 Cal 1 carton (325 mL) QID per tube. Provides 1820 kcal, 80 grams of protein, 936 mL H2O daily.  Flush tube with 30 mL before and after each bolus tube feed. Provide an additional flush of 200 mL 4 times daily between feeds. Provides a total of 1976 mL H2O daily including water in tube feeding regimen. Patient will also receive free water flush with medications.   Goal TF regimen meets >100% RDIs for vitamins/minerals so will d/c MVI.  NUTRITION DIAGNOSIS:   Inadequate oral intake related to inability to eat as evidenced by NPO status.  Ongoing.  GOAL:   Patient will meet greater than or equal to 90% of their needs  Met with TF regimen.  MONITOR:   Vent status,Labs,Weight trends,TF tolerance,I & O's  REASON FOR ASSESSMENT:   Ventilator,Consult Enteral/tube feeding initiation and management  ASSESSMENT:   55 year old female with PMHx of HTN admitted after acute cardiac arrest also with DKA and severe acidosis.  1/10 intubated 1/21 s/p tracheostomy tube placement 1/21 s/p G-tube placement  Patient remains on TC. Tolerating bolus tube feeds via G-tube. Plan to possibly discharge to CIR once bed available.  Medications reviewed and include: free water flush 200 mL QID, Novolog 0-15 units Q4hrs, Keppra, MVI daily, Protonix, Miralax, senna-docusate 1 tablet daily.  Labs reviewed: CBG 110-186, BUN 21, Phosphorus 4.7.  I/O: 500 mL UOP Yesterday + 5 occurrences unmeasured UOP  Weight trend: 84.3 kg on 2/7; -1.9 kg from 1/10  Enteral Access: 20 Fr. Endovive Safety G-tube  TF regimen: [employer] [place] Standard 1.4 Cal QID per tube  Discussed with RN and on rounds.  Diet Order:   Diet Order    None     EDUCATION NEEDS:   No education needs have been identified at this time  Skin:  Skin Assessment: Reviewed RN Assessment  Last BM:   12/31/2020 - small type 4  Height:   Ht Readings from Last 1 Encounters:  12/17/20 5' 0.98" (1.549 m)   Weight:   Wt Readings from Last 1 Encounters:  12/30/20 84.3 kg   Ideal Body Weight:  47.7 kg  BMI:  Body mass index is 35.13 kg/m.  Estimated Nutritional Needs:   Kcal:  1700-1900  Protein:  85-95 grams  Fluid:  >/= 2 L/day  Jacklynn Barnacle, MS, RD, LDN Pager number available on Amion

## 2020-12-31 NOTE — Care Plan (Addendum)
After further assessment and discussion of patient's  status and medical condition during multidisciplinary rounds, the plan is outlined as below  Continue PT/OT/speech Plan is to get patient to CIR It appears bed for CIR may be available in the next 2 to 3 days Patient has been off mechanical ventilation for 5 days From our standpoint may transfer to general MedSurg ward  PCCM will sign off please reconsult as needed   C. Derrill Kay, MD [facility]   *This note was dictated using voice recognition software/Dragon.  Despite best efforts to proofread, errors can occur which can change the meaning.  Any change was purely unintentional.

## 2021-01-01 LAB — CBC WITH DIFFERENTIAL/PLATELET
Abs Immature Granulocytes: 0.02 10*3/uL (ref 0.00–0.07)
Basophils Absolute: 0 10*3/uL (ref 0.0–0.1)
Basophils Relative: 1 %
Eosinophils Absolute: 0.3 10*3/uL (ref 0.0–0.5)
Eosinophils Relative: 5 %
HCT: 34 % — ABNORMAL LOW (ref 36.0–46.0)
Hemoglobin: 10.4 g/dL — ABNORMAL LOW (ref 12.0–15.0)
Immature Granulocytes: 0 %
Lymphocytes Relative: 26 %
Lymphs Abs: 1.8 10*3/uL (ref 0.7–4.0)
MCH: 26 pg (ref 26.0–34.0)
MCHC: 30.6 g/dL (ref 30.0–36.0)
MCV: 85 fL (ref 80.0–100.0)
Monocytes Absolute: 0.6 10*3/uL (ref 0.1–1.0)
Monocytes Relative: 9 %
Neutro Abs: 4.1 10*3/uL (ref 1.7–7.7)
Neutrophils Relative %: 59 %
Platelets: 292 10*3/uL (ref 150–400)
RBC: 4 MIL/uL (ref 3.87–5.11)
RDW: 19.4 % — ABNORMAL HIGH (ref 11.5–15.5)
WBC: 6.9 10*3/uL (ref 4.0–10.5)
nRBC: 0 % (ref 0.0–0.2)

## 2021-01-01 LAB — GLUCOSE, CAPILLARY
Glucose-Capillary: 137 mg/dL — ABNORMAL HIGH (ref 70–99)
Glucose-Capillary: 118 mg/dL — ABNORMAL HIGH (ref 70–99)
Glucose-Capillary: 150 mg/dL — ABNORMAL HIGH (ref 70–99)
Glucose-Capillary: 137 mg/dL — ABNORMAL HIGH (ref 70–99)
Glucose-Capillary: 164 mg/dL — ABNORMAL HIGH (ref 70–99)

## 2021-01-01 LAB — COMPREHENSIVE METABOLIC PANEL
ALT: 22 U/L (ref 0–44)
AST: 28 U/L (ref 15–41)
Albumin: 3.1 g/dL — ABNORMAL LOW (ref 3.5–5.0)
Alkaline Phosphatase: 84 U/L (ref 38–126)
Anion gap: 13 (ref 5–15)
BUN: 14 mg/dL (ref 6–20)
CO2: 29 mmol/L (ref 22–32)
Calcium: 9.8 mg/dL (ref 8.9–10.3)
Chloride: 102 mmol/L (ref 98–111)
Creatinine, Ser: 0.59 mg/dL (ref 0.44–1.00)
GFR, Estimated: >60 mL/min (ref >60)
Glucose, Bld: 152 mg/dL — ABNORMAL HIGH (ref 70–99)
Potassium: 4.2 mmol/L (ref 3.5–5.1)
Sodium: 144 mmol/L (ref 135–145)
Total Bilirubin: 0.3 mg/dL (ref 0.3–1.2)
Total Protein: 8.2 g/dL — ABNORMAL HIGH (ref 6.5–8.1)

## 2021-01-01 LAB — PHOSPHORUS: Phosphorus: 4.6 mg/dL (ref 2.5–4.6)

## 2021-01-01 LAB — MAGNESIUM: Magnesium: 2 mg/dL (ref 1.7–2.4)

## 2021-01-01 NOTE — Progress Notes (Signed)
Cross Cover Patient has been stable on T collar for 6 days.  Currently on 28% FiO2 via T collar without respiratory distress.  Had vomiting issue yesterday after bolus feed via PEG. PEG position was evaluated by xray and determined in good position.  Not further issues today with vomiting with change to running bolus feed over an hour via pump rather than bolus by gravity.   At bedside she is slightly tachy at 115 which is not new per day RN report. Her neuro status is unchanged and her blood pressure has been slightly high responsive to prn clonidine and scheduled norvasc.  She is stable to transfer to progressive unit per attending and my assessment

## 2021-01-01 NOTE — Progress Notes (Signed)
Inpatient Rehab Admissions Coordinator:   I have no beds available for this patient to admit to CIR today.  Will continue to follow for timing of potential admission pending bed availability.   Shann Medal, PT, DPT Admissions Coordinator [phone] 01/01/21  10:00 AM

## 2021-01-01 NOTE — Progress Notes (Signed)
PROGRESS NOTE    [name] [name]  AJO:[id number] DOB: 1966-05-25 DOA: 12/02/2020 PCP: Marinda Elk, MD    Brief Narrative:  Patient is a 55 year old female with acute cardiac arrest due to a severe acidosis and DKA.  She had a witnessed V. fib arrest, and it shocked in the field.  She was subsequently transferred to ICU, she was started on hypothermic protocol.  She was a found to have anoxic brain injury.  MRI brain was performed on 1/30, there is infarcts in the basal ganglion and white matter.  Also has evidence of anoxic brain injury.  Patient appear to also have seizures, has been seen by neurology.  That have improved, EEG no longer see any seizure activity.  Patient had tracheostomy and PEG tube placement on 1/22. Patient is transferred to [facility] 2/7.    Assessment & Plan:   Active Problems:   Cardiac arrest ([place])   Acute respiratory failure (HCC)   Hypoxic ischemic encephalopathy   Endotracheal tube present  #1.  Acute anoxic brain injury. Cardiac arrest secondary to V. fib. Possible seizure. Patient is a followed by neurology.  Has not been able to communicate. Continue symptomatic treatment.  Monitor for progression of brain injury. Continue as needed medicine for agitation. Patient has been evaluated by CIR, deemed to be appropriate for acute rehab.  Pending bed for transfer.  2.  Acute respite failure with hypoxemia and hypercapnia. Status post tracheostomy and PEG tube placement. Patient still on oxygen over trach collar.  Receiving tube feeding. Condition relatively stable now.  3.  Uncontrolled type 2 diabetes. Diabetic ketoacidosis. Continue current regimen with Lantus and sliding scale insulin.  4.  Essential hypertension. Continue blood pressure medicine.    DVT prophylaxis: Lovenox Code Status: Partial, ok to intubate Family Communication:  Disposition Plan:  .   Status is: Inpatient  Remains inpatient appropriate because:Inpatient level  of care appropriate due to severity of illness   Dispo: The patient is from: Home              Anticipated d/c is to: CIR              Anticipated d/c date is: 2 days              Patient currently is not medically stable to d/c.   Difficult to place patient No        I/O last 3 completed shifts: In: 9470 [Other:1005; NG/GT:600] Out: -  No intake/output data recorded.     Consultants:   Neurology  Procedures: PEG and Trach  Antimicrobials: None  Subjective: Patient nonverbal, confused. Does not seem to have any short of breath, still on oxygen over trach collar. Does not seem to have any pain, no nausea vomiting.  Objective: Vitals:   01/01/21 0000 01/01/21 0400 01/01/21 0500 01/01/21 0744  BP: (!) 147/85 (!) 112/94  (!) 112/94  Pulse: (!) 106 (!) 117  (!) 119  Resp: (!) 21 (!) 24  (!) 21  Temp: 99 F (37.2 C) 98.8 F (37.1 C)    TempSrc: Oral Oral    SpO2: 98% 98%    Weight:   80.3 kg   Height:        Intake/Output Summary (Last 24 hours) at 01/01/2021 1043 Last data filed at 12/31/2020 1900 Gross per 24 hour  Intake 1375 ml  Output --  Net 1375 ml   Filed Weights   12/28/20 0500 12/30/20 0500 01/01/21 0500  Weight: 85 kg  84.3 kg 80.3 kg    Examination:  General exam: Appears calm and comfortable  Respiratory system: Clear to auscultation. Respiratory effort normal. Cardiovascular system: S1 & S2 heard, RRR. No JVD, murmurs, rubs, gallops or clicks. No pedal edema. Gastrointestinal system: Abdomen is nondistended, soft and nontender. No organomegaly or masses felt. Normal bowel sounds heard. Central nervous system: Alert and confused. No focal neurological deficits. Extremities: Symmetric  Skin: No rashes, lesions or ulcers     Data Reviewed: I have personally reviewed following labs and imaging studies  CBC: Recent Labs  Lab 12/28/20 0645 12/29/20 0404 12/30/20 0416 12/31/20 0602 01/01/21 0458  WBC 8.7 8.8 6.9 6.4 6.9  NEUTROABS 5.8  6.0 4.6 3.5 4.1  HGB 9.6* 9.7* 10.0* 10.0* 10.4*  HCT 32.3* 32.1* 32.0* 33.5* 34.0*  MCV 85.2 85.6 85.6 85.9 85.0  PLT 343 333 310 273 449   Basic Metabolic Panel: Recent Labs  Lab 12/28/20 0645 12/29/20 0404 12/30/20 0416 12/31/20 0602 01/01/21 0458  NA 143 140 142 140 144  K 4.3 4.2 3.7 4.7 4.2  CL 104 103 103 100 102  CO2 28 28 28 28 29   GLUCOSE 127* 157* 129* 138* 152*  BUN 23* 19 16 21* 14  CREATININE 0.50 0.49 0.59 0.53 0.59  CALCIUM 9.6 9.3 9.3 9.8 9.8  MG 2.0 1.8 2.0 2.1 2.0  PHOS 4.9* 3.8 4.0 4.7* 4.6   GFR: Estimated Creatinine Clearance: 77.2 mL/min (by C-G formula based on SCr of 0.59 mg/dL). Liver Function Tests: Recent Labs  Lab 12/28/20 0645 12/29/20 0404 12/30/20 0416 12/31/20 0602 01/01/21 0458  AST  --   --   --   --  28  ALT  --   --   --   --  22  ALKPHOS  --   --   --   --  84  BILITOT  --   --   --   --  0.3  PROT  --   --   --   --  8.2*  ALBUMIN 2.7* 2.8* 2.9* 2.9* 3.1*   No results for input(s): LIPASE, AMYLASE in the last 168 hours. No results for input(s): AMMONIA in the last 168 hours. Coagulation Profile: No results for input(s): INR, PROTIME in the last 168 hours. Cardiac Enzymes: No results for input(s): CKTOTAL, CKMB, CKMBINDEX, TROPONINI in the last 168 hours. BNP (last 3 results) No results for input(s): PROBNP in the last 8760 hours. HbA1C: No results for input(s): HGBA1C in the last 72 hours. CBG: Recent Labs  Lab 12/31/20 1625 12/31/20 1928 12/31/20 2307 01/01/21 0354 01/01/21 0736  GLUCAP 169* 114* 150* 137* 118*   Lipid Profile: No results for input(s): CHOL, HDL, LDLCALC, TRIG, CHOLHDL, LDLDIRECT in the last 72 hours. Thyroid Function Tests: No results for input(s): TSH, T4TOTAL, FREET4, T3FREE, THYROIDAB in the last 72 hours. Anemia Panel: No results for input(s): VITAMINB12, FOLATE, FERRITIN, TIBC, IRON, RETICCTPCT in the last 72 hours. Sepsis Labs: No results for input(s): PROCALCITON, LATICACIDVEN in the  last 168 hours.  No results found for this or any previous visit (from the past 240 hour(s)).       Radiology Studies: EEG  Result Date: 12/30/2020 Lora Havens, MD     12/30/2020  4:28 PM Patient Name: [name] [name] MRN: [id number] Epilepsy Attending: Lora Havens Referring Physician/Provider: Dr. Lesleigh Noe Date: 12/30/2020 Duration: 24.12 mins Patient history: 55 year old female status post cardiac arrest with an episode of facial twitching.  EEG to  evaluate for seizures. Level of alertness: Awake AEDs during EEG study: Keppra, Klonopin Technical aspects: This EEG study was done with scalp electrodes positioned according to the 10-20 International system of electrode placement. Electrical activity was acquired at a sampling rate of 500Hz  and reviewed with a high frequency filter of 70Hz  and a low frequency filter of 1Hz . EEG data were recorded continuously and digitally stored. Description: No posterior dominant rhythm was seen.  EEG showed continuous generalized 3 to 6 Hz theta-delta slowing admixed with 15-18hz  generalized beta activity. Physiologic photic driving was not seen during photic stimulation. Hyperventilation was not performed.   IMPRESSION: This study issuggestive of moderate diffuse encephalopathy, nonspecific etiology. No seizures and epileptiform discharges were seen throughout the recording. Lora Havens   DG Abd 1 View  Result Date: 12/31/2020 CLINICAL DATA:  Peg tube placement EXAM: ABDOMEN - 1 VIEW COMPARISON:  None. FINDINGS: Two frontal views of the lower chest and upper abdomen demonstrate contrast material injected through indwelling PEG tube outlining the gastric rugal folds. No evidence of contrast extravasation. Bowel gas pattern is unremarkable. IMPRESSION: 1. Peg tube within the gastric lumen as above. Electronically Signed   By: Randa Ngo M.D.   On: 12/31/2020 21:53        Scheduled Meds: . sodium chloride   Intravenous Once  . alteplase  2  mg Intracatheter Once  . amLODipine  10 mg Per Tube Daily  . atorvastatin  80 mg Per Tube Daily  . chlorhexidine gluconate (MEDLINE KIT)  15 mL Mouth Rinse BID  . Chlorhexidine Gluconate Cloth  6 each Topical Daily  . clonazePAM  1 mg Per Tube BID  . enoxaparin (LOVENOX) injection  40 mg Subcutaneous Q24H  . feeding supplement (KATE FARMS STANDARD 1.4)  325 mL Per Tube QID  . free water  200 mL Per Tube QID  . insulin aspart  0-15 Units Subcutaneous Q4H  . insulin glargine  5 Units Subcutaneous Daily  . levETIRAcetam  1,000 mg Per Tube BID  . mouth rinse  15 mL Mouth Rinse 10 times per day  . oxyCODONE  5 mg Per Tube Q6H  . pantoprazole sodium  40 mg Per Tube QHS  . polyethylene glycol  17 g Per Tube Daily  . QUEtiapine  25 mg Per Tube QHS  . senna-docusate  1 tablet Per Tube Daily  . sodium chloride flush  10-40 mL Intracatheter Q12H   Continuous Infusions: . sodium chloride 250 mL (12/19/20 0449)     LOS: 30 days    Time spent: 34 minutes    Sharen Hones, MD [facility]   To contact the attending provider between 7A-7P or the covering provider during after hours 7P-7A, please log into the web site [email or web address] and access using universal [place] password for that web site. If you do not have the password, please call the hospital operator.  01/01/2021, 10:43 AM

## 2021-01-01 NOTE — TOC Progression Note (Signed)
Transition of Care [facility]) - Progression Note    Patient Details  Name: [name] [name] MRN: [id number] Date of Birth: [birth year]  Transition of [facility]) CM/SW [place], [place] Phone Number: [phone] 01/01/2021, 9:24 AM  Clinical Narrative:     Patient pending CIR placement, possible bed availability in 2-3 days.  Expected Discharge Plan: Long Term Nursing Home Barriers to Discharge: Inadequate or no insurance,Continued Medical Work up,Financial Resources  Expected Discharge Plan and Services Expected Discharge Plan: Long Term Nursing Home In-house Referral: Clinical Social Work   Post Acute Care Choice: Nursing Home Living arrangements for the past 2 months: Single Family Home                                       Social Determinants of Health (SDOH) Interventions    Readmission Risk Interventions No flowsheet data found.

## 2021-01-02 LAB — COMPREHENSIVE METABOLIC PANEL
ALT: 19 U/L (ref 0–44)
AST: 24 U/L (ref 15–41)
Albumin: 3.3 g/dL — ABNORMAL LOW (ref 3.5–5.0)
Alkaline Phosphatase: 82 U/L (ref 38–126)
Anion gap: 15 (ref 5–15)
BUN: 21 mg/dL — ABNORMAL HIGH (ref 6–20)
CO2: 28 mmol/L (ref 22–32)
Calcium: 9.8 mg/dL (ref 8.9–10.3)
Chloride: 102 mmol/L (ref 98–111)
Creatinine, Ser: 0.7 mg/dL (ref 0.44–1.00)
GFR, Estimated: >60 mL/min (ref >60)
Glucose, Bld: 157 mg/dL — ABNORMAL HIGH (ref 70–99)
Potassium: 3.6 mmol/L (ref 3.5–5.1)
Sodium: 145 mmol/L (ref 135–145)
Total Bilirubin: 0.4 mg/dL (ref 0.3–1.2)
Total Protein: 8.4 g/dL — ABNORMAL HIGH (ref 6.5–8.1)

## 2021-01-02 LAB — GLUCOSE, CAPILLARY
Glucose-Capillary: 125 mg/dL — ABNORMAL HIGH (ref 70–99)
Glucose-Capillary: 148 mg/dL — ABNORMAL HIGH (ref 70–99)
Glucose-Capillary: 149 mg/dL — ABNORMAL HIGH (ref 70–99)
Glucose-Capillary: 157 mg/dL — ABNORMAL HIGH (ref 70–99)
Glucose-Capillary: 171 mg/dL — ABNORMAL HIGH (ref 70–99)

## 2021-01-02 LAB — CBC WITH DIFFERENTIAL/PLATELET
Abs Immature Granulocytes: 0.03 10*3/uL (ref 0.00–0.07)
Basophils Absolute: 0 10*3/uL (ref 0.0–0.1)
Basophils Relative: 1 %
Eosinophils Absolute: 0.3 10*3/uL (ref 0.0–0.5)
Eosinophils Relative: 3 %
HCT: 34.9 % — ABNORMAL LOW (ref 36.0–46.0)
Hemoglobin: 10.7 g/dL — ABNORMAL LOW (ref 12.0–15.0)
Immature Granulocytes: 0 %
Lymphocytes Relative: 23 %
Lymphs Abs: 1.8 10*3/uL (ref 0.7–4.0)
MCH: 26.2 pg (ref 26.0–34.0)
MCHC: 30.7 g/dL (ref 30.0–36.0)
MCV: 85.5 fL (ref 80.0–100.0)
Monocytes Absolute: 0.6 10*3/uL (ref 0.1–1.0)
Monocytes Relative: 8 %
Neutro Abs: 5.1 10*3/uL (ref 1.7–7.7)
Neutrophils Relative %: 65 %
Platelets: 276 10*3/uL (ref 150–400)
RBC: 4.08 MIL/uL (ref 3.87–5.11)
RDW: 19.6 % — ABNORMAL HIGH (ref 11.5–15.5)
WBC: 7.8 10*3/uL (ref 4.0–10.5)
nRBC: 0 % (ref 0.0–0.2)

## 2021-01-02 LAB — PHOSPHORUS: Phosphorus: 4.9 mg/dL — ABNORMAL HIGH (ref 2.5–4.6)

## 2021-01-02 LAB — MAGNESIUM: Magnesium: 2.1 mg/dL (ref 1.7–2.4)

## 2021-01-02 NOTE — Progress Notes (Signed)
Assisted with pt transport to 241. Pt transported with 30% VM. Pt placed back on 28% ATC. Extra trach, trach supplies, AMBU bag, suction set up at bedside.

## 2021-01-02 NOTE — Progress Notes (Signed)
Inpatient Rehab Admissions Coordinator:   I have no beds available for this patient to admit to CIR today.  Will continue to follow for timing of potential admission pending bed availability.    Shann Medal, PT, DPT Admissions Coordinator [phone] 01/02/21  9:36 AM

## 2021-01-02 NOTE — Progress Notes (Signed)
Physical Therapy Treatment Patient Details Name: [name] [name] MRN: [id number] DOB: 1966-07-26 Today's Date: 01/02/2021    History of Present Illness Pt is a 55 year old female presented to the ED via EMS post witnessed cardiac arrest. ROSC achieved following 3 defibrillations and one round of epinephrine. Patient found to have significant hypokalemia on arrival. Given witnessed arrest, patient Code ICE.  S/p trach and peg tube placement.  Workup showed anoxic brain injury. Pt also showed facial twitching that could be myoclonic versus partial seizure.    PT Comments    Patient in bed, alert, able to shake head yes/no, smile at PT. Pt performed supine to sit maxA, and min-modA to reposition to midline to achieve balance. Once seated in midline, pt predominantly CGA and intermittently minA to maintain position. R lateral lean noted with pt fatigue, a few seated supported rest breaks provided. Pt able to sit EOB for 22 minutes and participate in righting reactions as well as bilateral LE AAROM LAQ, some muscle activation noted L >R. Returned to supine with second assist for safety, maxA. Pt able to perform a few supine exercises, with max multimodal cueing. Family at bedside, and pt with all needs in reach. Current plan remains appropriate to maximize pt safety, independence, mobility, pt and family very motivated.      Follow Up Recommendations  CIR     Equipment Recommendations  Other (comment) (TBD)    Recommendations for Other Services OT consult;Rehab consult     Precautions / Restrictions Precautions Precautions: Fall Precaution Comments: trach collar 5 L Restrictions Weight Bearing Restrictions: No    Mobility  Bed Mobility Overal bed mobility: Needs Assistance Bed Mobility: Supine to Sit;Sit to Supine     Supine to sit: Max assist;HOB elevated Sit to supine: Max assist;+2 for safety/equipment   General bed mobility comments: increased assist to return to bed secondary  to fatigue    Transfers                    Ambulation/Gait                 Stairs             Wheelchair Mobility    Modified Rankin (Stroke Patients Only)       Balance Overall balance assessment: Needs assistance   Sitting balance-Leahy Scale: Fair Sitting balance - Comments: varying levels of seated balance throughout ranging from min guard - min A                                    Cognition Arousal/Alertness: Awake/alert Behavior During Therapy: Flat affect Overall Cognitive Status: Difficult to assess                                 General Comments: Pt is non verbal but cooperative. She follows commands with increased time. She does nod head yes/no      Exercises General Exercises - Lower Extremity Short Arc Quad: AAROM;Strengthening;Both;5 reps Long Arc Quad: AAROM;Both;10 reps Heel Slides: AAROM;Strengthening;Both;10 reps Other Exercises Other Exercises: majority of session spent in sitting, once positioned in midline varying levels of assistance CGA-minA. R lateral lean noted today. improved balance noted with BUE propping Other Exercises: seated AAROM LAQx10    General Comments        Pertinent Vitals/Pain Pain Assessment:  Faces Faces Pain Scale: No hurt    Home Living                      Prior Function            PT Goals (current goals can now be found in the care plan section) Progress towards PT goals: Progressing toward goals    Frequency    Min 2X/week      PT Plan Current plan remains appropriate    Co-evaluation              AM-PAC PT "6 Clicks" Mobility   Outcome Measure  Help needed turning from your back to your side while in a flat bed without using bedrails?: Total Help needed moving from lying on your back to sitting on the side of a flat bed without using bedrails?: Total Help needed moving to and from a bed to a chair (including a wheelchair)?:  Total Help needed standing up from a chair using your arms (e.g., wheelchair or bedside chair)?: Total Help needed to walk in hospital room?: Total Help needed climbing 3-5 steps with a railing? : Total 6 Click Score: 6    End of Session Equipment Utilized During Treatment: Oxygen Activity Tolerance: Patient tolerated treatment well Patient left: in bed;with call bell/phone within reach;with family/visitor present Nurse Communication: Mobility status PT Visit Diagnosis: Other abnormalities of gait and mobility (R26.89);Muscle weakness (generalized) (M62.81);Difficulty in walking, not elsewhere classified (R26.2);Other symptoms and signs involving the nervous system (B35.329)     Time: 9242-6834 PT Time Calculation (min) (ACUTE ONLY): 39 min  Charges:  $Therapeutic Exercise: 38-52 mins                     Lieutenant Diego PT, DPT 4:26 PM,01/02/21

## 2021-01-02 NOTE — Progress Notes (Signed)
PROGRESS NOTE    [name] [name]  GEX:[id number] DOB: [birth year] DOA: 12/02/2020 PCP: Marinda Elk, MD    Brief Narrative:  Patient is a 55 year old female with acute cardiac arrest due to a severe acidosis and DKA.  She had a witnessed V. fib arrest, and it shocked in the field.  She was subsequently transferred to ICU, she was started on hypothermic protocol.  She was a found to have anoxic brain injury.  MRI brain was performed on 1/30, there is infarcts in the basal ganglion and white matter.  Also has evidence of anoxic brain injury.  Patient appear to also have seizures, has been seen by neurology.  That have improved, EEG no longer see any seizure activity.  Patient had tracheostomy and PEG tube placement on 1/22. Patient is transferred to [facility] 2/7.   Assessment & Plan:   Active Problems:   Cardiac arrest ([place])   Acute respiratory failure (HCC)   Hypoxic ischemic encephalopathy   Endotracheal tube present  #1.  Acute anoxic brain injury. Cardiac arrest secondary to V. fib. Possible seizure. Patient condition has been stable, she is tolerating tube feeding.  She is on 5 L oxygen over trach collar. She is pending for placement.  2.  Acute respiratory failure with hypoxemia and hypercapnia. S/p tracheostomy and PEG placement. Condition relatively stable.  3.  Uncontrolled type 2 diabetes. Diabetes ketoacidosis. Condition improved, continue current regimen.  4.  Essential hypertension.    DVT prophylaxis: Lovenox Code Status: Partial Family Communication:  Disposition Plan:  .   Status is: Inpatient  Remains inpatient appropriate because:Inpatient level of care appropriate due to severity of illness   Dispo: The patient is from: Home              Anticipated d/c is to: CIR vs SNF              Anticipated d/c date is: > 3 days              Patient currently is not medically stable to d/c.   Difficult to place patient No        I/O last 3  completed shifts: In: 20 [I.V.:20] Out: 0  No intake/output data recorded.     Consultants:   Pulm  Procedures: Trach. PEG  Antimicrobials:None  Subjective: Patient is nonverbal, confused.  Better tolerating tube feeding. On 5 L oxygen over trach collar, no respiratory distress. No diarrhea.  Objective: Vitals:   01/02/21 0821 01/02/21 1020 01/02/21 1106 01/02/21 1108  BP: (!) 141/81 138/80 134/83   Pulse: (!) 124 (!) 118 (!) 119   Resp: (!) 22 (!) 22    Temp: 99.8 F (37.7 C) 99.6 F (37.6 C) 99.4 F (37.4 C)   TempSrc: Oral  Oral   SpO2: 97% 98% 100% 100%  Weight:      Height:        Intake/Output Summary (Last 24 hours) at 01/02/2021 1258 Last data filed at 01/01/2021 2000 Gross per 24 hour  Intake --  Output 0 ml  Net 0 ml   Filed Weights   12/30/20 0500 01/01/21 0500 01/02/21 0525  Weight: 84.3 kg 80.3 kg 79.6 kg    Examination:  General exam: Appears calm and comfortable  Respiratory system: Clear to auscultation. Respiratory effort normal. Cardiovascular system: S1 & S2 heard, RRR. No JVD, murmurs, rubs, gallops or clicks. No pedal edema. Gastrointestinal system: Abdomen is nondistended, soft and nontender. No organomegaly or masses felt. Normal bowel  sounds heard. Central nervous system: Alert and confused. No focal neurological deficits. Extremities: Symmetric 5 x 5 power. Skin: No rashes, lesions or ulcers     Data Reviewed: I have personally reviewed following labs and imaging studies  CBC: Recent Labs  Lab 12/29/20 0404 12/30/20 0416 12/31/20 0602 01/01/21 0458 01/02/21 0935  WBC 8.8 6.9 6.4 6.9 7.8  NEUTROABS 6.0 4.6 3.5 4.1 5.1  HGB 9.7* 10.0* 10.0* 10.4* 10.7*  HCT 32.1* 32.0* 33.5* 34.0* 34.9*  MCV 85.6 85.6 85.9 85.0 85.5  PLT 333 310 273 292 884   Basic Metabolic Panel: Recent Labs  Lab 12/29/20 0404 12/30/20 0416 12/31/20 0602 01/01/21 0458 01/02/21 0935  NA 140 142 140 144 145  K 4.2 3.7 4.7 4.2 3.6  CL 103 103  100 102 102  CO2 28 28 28 29 28   GLUCOSE 157* 129* 138* 152* 157*  BUN 19 16 21* 14 21*  CREATININE 0.49 0.59 0.53 0.59 0.70  CALCIUM 9.3 9.3 9.8 9.8 9.8  MG 1.8 2.0 2.1 2.0 2.1  PHOS 3.8 4.0 4.7* 4.6 4.9*   GFR: Estimated Creatinine Clearance: 76.8 mL/min (by C-G formula based on SCr of 0.7 mg/dL). Liver Function Tests: Recent Labs  Lab 12/29/20 0404 12/30/20 0416 12/31/20 0602 01/01/21 0458 01/02/21 0935  AST  --   --   --  28 24  ALT  --   --   --  22 19  ALKPHOS  --   --   --  84 82  BILITOT  --   --   --  0.3 0.4  PROT  --   --   --  8.2* 8.4*  ALBUMIN 2.8* 2.9* 2.9* 3.1* 3.3*   No results for input(s): LIPASE, AMYLASE in the last 168 hours. No results for input(s): AMMONIA in the last 168 hours. Coagulation Profile: No results for input(s): INR, PROTIME in the last 168 hours. Cardiac Enzymes: No results for input(s): CKTOTAL, CKMB, CKMBINDEX, TROPONINI in the last 168 hours. BNP (last 3 results) No results for input(s): PROBNP in the last 8760 hours. HbA1C: No results for input(s): HGBA1C in the last 72 hours. CBG: Recent Labs  Lab 01/01/21 1940 01/01/21 2359 01/02/21 0438 01/02/21 0822 01/02/21 1229  GLUCAP 164* 148* 125* 149* 171*   Lipid Profile: No results for input(s): CHOL, HDL, LDLCALC, TRIG, CHOLHDL, LDLDIRECT in the last 72 hours. Thyroid Function Tests: No results for input(s): TSH, T4TOTAL, FREET4, T3FREE, THYROIDAB in the last 72 hours. Anemia Panel: No results for input(s): VITAMINB12, FOLATE, FERRITIN, TIBC, IRON, RETICCTPCT in the last 72 hours. Sepsis Labs: No results for input(s): PROCALCITON, LATICACIDVEN in the last 168 hours.  No results found for this or any previous visit (from the past 240 hour(s)).       Radiology Studies: DG Abd 1 View  Result Date: 12/31/2020 CLINICAL DATA:  Peg tube placement EXAM: ABDOMEN - 1 VIEW COMPARISON:  None. FINDINGS: Two frontal views of the lower chest and upper abdomen demonstrate contrast  material injected through indwelling PEG tube outlining the gastric rugal folds. No evidence of contrast extravasation. Bowel gas pattern is unremarkable. IMPRESSION: 1. Peg tube within the gastric lumen as above. Electronically Signed   By: Randa Ngo M.D.   On: 12/31/2020 21:53        Scheduled Meds: . sodium chloride   Intravenous Once  . alteplase  2 mg Intracatheter Once  . amLODipine  10 mg Per Tube Daily  . atorvastatin  80 mg  Per Tube Daily  . chlorhexidine gluconate (MEDLINE KIT)  15 mL Mouth Rinse BID  . Chlorhexidine Gluconate Cloth  6 each Topical Daily  . clonazePAM  1 mg Per Tube BID  . enoxaparin (LOVENOX) injection  40 mg Subcutaneous Q24H  . feeding supplement (KATE FARMS STANDARD 1.4)  325 mL Per Tube QID  . free water  200 mL Per Tube QID  . insulin aspart  0-15 Units Subcutaneous Q4H  . insulin glargine  5 Units Subcutaneous Daily  . levETIRAcetam  1,000 mg Per Tube BID  . mouth rinse  15 mL Mouth Rinse 10 times per day  . oxyCODONE  5 mg Per Tube Q6H  . pantoprazole sodium  40 mg Per Tube QHS  . polyethylene glycol  17 g Per Tube Daily  . QUEtiapine  25 mg Per Tube QHS  . senna-docusate  1 tablet Per Tube Daily  . sodium chloride flush  10-40 mL Intracatheter Q12H   Continuous Infusions: . sodium chloride 250 mL (12/19/20 0449)     LOS: 31 days    Time spent: 28 minutes    Sharen Hones, MD [facility]   To contact the attending provider between 7A-7P or the covering provider during after hours 7P-7A, please log into the web site [email or web address] and access using universal [place] password for that web site. If you do not have the password, please call the hospital operator.  01/02/2021, 12:58 PM

## 2021-01-02 NOTE — PMR Pre-admission (Addendum)
PMR Admission Coordinator Pre-Admission Assessment  Patient: [name] [name] is an 55 y.o., female MRN: [id number] DOB: 13-Oct-1966 Height: 5' 0.98" (154.9 cm) Weight: 77.2 kg              Insurance Information Uninsured.  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  Presented to [facility] 12/02/2020  unwitnessed cardiac arrest.  Per EMS she received 2 shocks by FD prior to EMS arrival.  After EMS arrived she was noted to be in V. fib.  She did receive 1 round of epi.  Total downtime was unknown.  On arrival to the ED her GCS was 3.  She required intubation for airway protection.  Admission chemistries potassium 2.1, CO2 20, glucose 357, creatinine 1.01, AST 138, ALT 120, hemoglobin 9.7, WBC 11,900, hemoglobin A1c 5.9, troponin 71 and peaked at 900, lactic acid 10 urinalysis negative nitrite.  Code sepsis was initiated placed on ceftriaxone.  CT/MRI negative for acute changes.  Chest x-ray with no edema or airspace opacity.  She was completing a course of Unasyn for aspiration pneumonia.  EEG negative for seizure however it was suggestive of severe diffuse encephalopathy and patient remains on Keppra for seizure prophylaxis..  Echocardiogram with ejection fraction of 60 to 65% no wall motion abnormalities and no signs of right heart strain.  Palliative  care was consulted to establish goals of care.  Hospital course tracheostomy 12/13/2020 per Dr. Clyde Canterbury as well as placement of gastrostomy tube for nutritional support 12/13/2020 per Dr. Andrey Farmer and currently remains n.p.o..  Cardiology services follow-up for ischemic evaluation  post V. fib arrest Dr. Ida Rogue and no current plan for invasive ischemic evaluation at this time given suboptimal candidate in the setting of current comorbidity conditions. Neurology follow-up for suspect anoxic brain injury with initial CT MRI unremarkable it was repeated 12/23/2020 findings consistent with anoxic brain injury.  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  Evolutionary changes likely related to evolving ischemia compared with prior MRI of 12/07/2020.  Therapy evaluations completed due to patient's cognitive dysfunction and decreased functional mobility and recommendations are for a comprehensive rehab program.   Glasgow Coma Scale Score: 11  Past Medical History  Past Medical History:  Diagnosis Date   Hypertension     Family History  family history includes Diabetes in her mother; Healthy in her father; Hypertension in her mother; Leukemia in her mother.  Prior Rehab/Hospitalizations:  Has the patient had prior rehab or hospitalizations prior to admission? No  Has the patient had major surgery during 100 days prior to admission? Yes  Current Medications   Current Facility-Administered Medications:    0.9 %  sodium chloride infusion (Manually program via Guardrails IV Fluids), , Intravenous, Once, Rust-Chester, Britton L, NP   0.9 %  sodium chloride infusion, 250 mL, Intravenous, Continuous, Dewaine Conger,  Denyce Robert, NP, Last Rate: 10 mL/hr at 01/10/21 0341, Restarted at 01/10/21 0341   acetaminophen (TYLENOL) tablet 650 mg, 650 mg, Per Tube, Q8H PRN, Rust-Chester, Toribio Harbour L, NP, 650 mg at 01/05/21 0051   alteplase (CATHFLO ACTIVASE) injection 2 mg,  2 mg, Intracatheter, Once, Flora Lipps, MD   amLODipine (NORVASC) tablet 10 mg, 10 mg, Per Tube, Daily, Mortimer Fries, Kurian, MD, 10 mg at 01/09/21 1000   atorvastatin (LIPITOR) tablet 80 mg, 80 mg, Per Tube, Daily, Kasa, Kurian, MD, 80 mg at 01/09/21 1000   bisacodyl (DULCOLAX) suppository 10 mg, 10 mg, Rectal, Daily PRN, Sharion Settler, NP   chlorhexidine gluconate (MEDLINE KIT) (PERIDEX) 0.12 % solution 15 mL, 15 mL, Mouth Rinse, BID, Kasa, Kurian, MD, 15 mL at 01/09/21 2152   Chlorhexidine Gluconate Cloth 2 % PADS 6 each, 6 each, Topical, Daily, Kasa, Kurian, MD, 6 each at 01/09/21 1018   clonazePAM (KLONOPIN) tablet 0.5 mg, 0.5 mg, Per Tube, BID, Danford, Suann Larry, MD, 0.5 mg at 01/09/21 2134   dextrose 5 % solution, , Intravenous, Continuous, Sharen Hones, MD, Paused at 01/10/21 0342   feeding supplement (OSMOLITE 1.5 CAL) liquid 1,000 mL, 1,000 mL, Per Tube, Q24H, Danford, Suann Larry, MD   feeding supplement (PROSource TF) liquid 45 mL, 45 mL, Per Tube, BID, Sharen Hones, MD, 45 mL at 01/09/21 2135   free water 30 mL, 30 mL, Per Tube, Q4H, Sharen Hones, MD, 30 mL at 01/10/21 0400   guaiFENesin (ROBITUSSIN) 100 MG/5ML solution 200 mg, 200 mg, Per Tube, Q4H PRN, Rust-Chester, Toribio Harbour L, NP, 200 mg at 01/07/21 2124   hydrALAZINE (APRESOLINE) injection 10 mg, 10 mg, Intravenous, Q4H PRN, Nelle Don, MD, 10 mg at 12/29/20 2148   levETIRAcetam (KEPPRA) 100 MG/ML solution 1,000 mg, 1,000 mg, Per Tube, BID, Greta Doom, MD, 1,000 mg at 01/09/21 2138   MEDLINE mouth rinse, 15 mL, Mouth Rinse, 10 times per day, Flora Lipps, MD, 15 mL at 01/10/21 7858   metoCLOPramide (REGLAN) 5 MG/5ML solution 5 mg, 5 mg, Per Tube, TID AC, Danford, Suann Larry, MD, 5 mg at 01/09/21 1704   metoprolol tartrate (LOPRESSOR) tablet 25 mg, 25 mg, Per Tube, BID, Minna Merritts, MD, 25 mg at 01/09/21 2134   midazolam (VERSED) injection 2 mg, 2 mg, Intravenous, Q1H PRN, Rust-Chester, Britton L, NP, 2 mg  at 01/01/21 2245   multivitamin liquid 15 mL, 15 mL, Per Tube, Daily, Sharen Hones, MD, 15 mL at 01/09/21 1138   ondansetron (ZOFRAN) injection 4 mg, 4 mg, Intravenous, Q6H PRN, Sharion Settler, NP   oxyCODONE (Oxy IR/ROXICODONE) immediate release tablet 5 mg, 5 mg, Per Tube, Q8H, Danford, Suann Larry, MD, 5 mg at 01/10/21 0629   polyethylene glycol (MIRALAX / GLYCOLAX) packet 17 g, 17 g, Per Tube, Daily, Benita Gutter, RPH, 17 g at 01/09/21 1001   potassium chloride (KLOR-CON) packet 40 mEq, 40 mEq, Oral, Once, Danford, Suann Larry, MD   potassium chloride SA (KLOR-CON) CR tablet 20 mEq, 20 mEq, Oral, QHS, Danford, Suann Larry, MD   QUEtiapine (SEROQUEL) tablet 25 mg, 25 mg, Per Tube, QHS, Kasa, Kurian, MD, 25 mg at 01/09/21 2134   senna-docusate (Senokot-S) tablet 1 tablet, 1 tablet, Per Tube, Daily, Benita Gutter, RPH, 1 tablet at 01/09/21 1001   sodium chloride flush (NS) 0.9 % injection 10-40 mL, 10-40 mL, Intracatheter, Q12H, Dewald, Jonathan B, MD, 10 mL at 01/09/21 1019   sodium chloride flush (NS) 0.9 % injection 10-40 mL, 10-40  mL, Intracatheter, PRN, Freddi Starr, MD, 10 mL at 12/26/20 2150  Patients Current Diet:  Diet Order     None       Precautions / Restrictions Precautions Precautions: Fall Precaution Comments: Trach collar 5 L; PEG tube Restrictions Weight Bearing Restrictions: No   Has the patient had 2 or more falls or a fall with injury in the past year?Yes  Prior Activity Level Community (5-7x/wk): fully independent, not working, driving, no DME used prior to admission  Prior Functional Level Prior Function Level of Independence: Independent Comments: Pt unable to provide PLOF; family to inform OT as able  Self Care: Did the patient need help bathing, dressing, using the toilet or eating?  Independent  Indoor Mobility: Did the patient need assistance with walking from room to room (with or without device)? Independent  Stairs: Did the  patient need assistance with internal or external stairs (with or without device)? Independent  Functional Cognition: Did the patient need help planning regular tasks such as shopping or remembering to take medications? Independent  Home Assistive Devices / Equipment Home Assistive Devices/Equipment: None  Prior Device Use: Indicate devices/aids used by the patient prior to current illness, exacerbation or injury? None of the above  Current Functional Level Cognition  Overall Cognitive Status: Difficult to assess Difficult to assess due to: Tracheostomy Orientation Level: Intubated/Tracheostomy - Unable to assess General Comments: pt non-verbal but tracking with eyes at times; initially not shaking head yes/no beginning of session but then able to perform this towards end of session    Extremity Assessment (includes Sensation/Coordination)  Upper Extremity Assessment: RUE deficits/detail,LUE deficits/detail RUE Deficits / Details: Pt able to sqeeze hand on commands but unable to lift UE against gravity. PROM WFLs. Hand over hand for tasks LUE Deficits / Details: OT observed pt lift and bend at elbow but unable to perform on commands. No functional at this time. HOH for tasks  Lower Extremity Assessment: Defer to PT evaluation RLE Deficits / Details: able to wiggle toes, ankle pump, AAROM for hip abduction/adduction, one rep AROM heel slides LLE Deficits / Details: able to wiggle toes, ankle pump, AAROM for hip abduction/adduction, able to  AROM heel slides independently    ADLs  Overall ADL's : Needs assistance/impaired Grooming: Wash/dry face,Sitting,Minimal assistance Grooming Details (indicate cue type and reason): min A to initiate and then pt performs Upper Body Dressing : Maximal assistance Upper Body Dressing Details (indicate cue type and reason): changing gown- pushed L UE through sleeve    Mobility  Overal bed mobility: Needs Assistance Bed Mobility: Supine to Sit,Sit to  Supine Supine to sit: HOB elevated,Max assist Sit to supine: Total assist,+2 for safety/equipment,+2 for physical assistance General bed mobility comments: assist for trunk and B LE's; vc's for technique    Transfers  Overall transfer level: Needs assistance Equipment used: Sliding board Transfers: Lateral/Scoot Transfers Sit to Stand: Max assist,+2 physical assistance,+2 safety/equipment  Lateral/Scoot Transfers: Max assist,+2 physical assistance General transfer comment: lateral scoot 1x to L along bed (used of bed sheet to assist); assist for balance d/t mild posterior lean    Ambulation / Gait / Stairs / Wheelchair Mobility  Ambulation/Gait General Gait Details: not appropriate at this time    Posture / Balance Dynamic Sitting Balance Sitting balance - Comments: pt initially max assist for sitting balance d/t significant posterior lean but improved to min assist occasionally (d/t R lateral lean at times) but otherwise CGA for safety Balance Overall balance assessment: Needs assistance Sitting-balance  support: Feet supported Sitting balance-Leahy Scale: Poor Sitting balance - Comments: pt initially max assist for sitting balance d/t significant posterior lean but improved to min assist occasionally (d/t R lateral lean at times) but otherwise CGA for safety Postural control: Right lateral lean Standing balance-Leahy Scale: Zero    Special needs/care consideration Oxygen 5L on 28% trach collar, Trach size 69m cuffed, Diabetic management yes and Behavioral consideration yes, BI     Previous Home Environment (from acute therapy documentation) Living Arrangements: Spouse/significant other,Children Available Help at Discharge: Family,Available 24 hours/day [place] Additional Comments: Pt's husband and grown children are very dedicated to her care. Pt was independent PTA.  Discharge Living Setting Plans for Discharge Living Setting: Patient's  home Type of Home at Discharge: House Discharge Home Layout: Able to live on main level with bedroom/bathroom Discharge Home Access: Stairs to enter Entrance Stairs-Rails: None Entrance Stairs-Number of Steps: 2-3 (already looking into ramp) Discharge Bathroom Shower/Tub: Tub/shower unit Discharge Bathroom Toilet: Standard Discharge Bathroom Accessibility: Yes How Accessible: Accessible via walker Does the patient have any problems obtaining your medications?: No  Social/Family/Support Systems Patient Roles: Spouse,Parent (adult children) Anticipated Caregiver: spouse DMarguerite Oleais primary contact, has twin daughters and 1 will be moving in with them for 24/7 supervision/assist Anticipated Caregiver's Contact Information: DMarguerite [phone]/Limitations of Caregiver: n/a Caregiver Availability: 24/7 Discharge Plan Discussed with Primary Caregiver: Yes Is Caregiver In Agreement with Plan?: Yes Does Caregiver/Family have Issues with Lodging/Transportation while Pt is in Rehab?: No   Goals Patient/Family Goal for Rehab: PT/OT/SLP min/mod A  Expected length of stay: 20-24 days Additional Information: new trach, PEG, husband applying for guardianship so he can get DPOA and HCPOA Pt/Family Agrees to Admission and willing to participate: Yes Program Orientation Provided & Reviewed with Pt/Caregiver Including Roles  & Responsibilities: Yes  Barriers to Discharge: Insurance for SNF coverage   Decrease burden of Care through IP rehab admission: n/a  Possible need for SNF placement upon discharge: Not anticipated.  Pt with excellent family support.   Patient Condition: [facility] spoken [facility] CM, and spouse. I discussed via phone for inpatient rehabilitation assessment.  Patient will benefit from ongoing PT, OT and SLP, can actively participate in 3 hours of therapy a day 5 days of the week, and can make measurable gains during the admission.  Patient  will also benefit from the coordinated team approach during an Inpatient Acute Rehabilitation admission.  The patient will receive intensive therapy as well as Rehabilitation physician, nursing, social worker, and care management interventions.  Due to bladder management, bowel management, safety, skin/wound care, disease management, medication administration, pain management and patient education the patient requires 24 hour a day rehabilitation nursing.  The patient is currently mod to max +2 with mobility and basic ADLs.  Discharge setting and therapy post discharge at home with home health is anticipated.  Patient has agreed to participate in the Acute Inpatient Rehabilitation Program and will admit today.  Preadmission Screen Completed By:  CMichel Santee PT, DPT 01/10/2021 10:14 AM ______________________________________________________________________   Discussed status with Dr. PPosey Prontoon 01/10/21 at  10:14 AM  and received approval for admission today.  Admission Coordinator:  CMichel Santee PT, DPT time 10:14 AM /Sudie Grumbling02/18/22

## 2021-01-02 NOTE — Progress Notes (Signed)
Patient arrived to unit from CCU with CCU nurse and RT.  She is very lethargic and unarousable upon admission.  Put her on telemetry and assessed her skin.  Positioned her with pillows underneath all of her extremities.  Arms and legs are flaccid.  She has scheduled pain medicine but d/t low BP and lethargy, will hold at this time.  Will continue to monitor for restlessness or agitation through this shift.

## 2021-01-02 NOTE — Progress Notes (Signed)
OT Cancellation Note  Patient Details Name: [name] [name] MRN: [id number] DOB: [birth year]   Cancelled Treatment:    Reason Eval/Treat Not Completed: Fatigue/lethargy limiting ability to participate. Pt moved to new unit during the night and having just participated with PT appears very fatigued at this time. OT will re-attempt when pt is able to actively participate in OT intervention.    Darleen Crocker, MS, OTR/L , CBIS ascom [phone]  01/02/21, 3:21 PM  01/02/2021, 3:21 PM

## 2021-01-03 LAB — CBC WITH DIFFERENTIAL/PLATELET
Abs Immature Granulocytes: 0.02 10*3/uL (ref 0.00–0.07)
Basophils Absolute: 0.1 10*3/uL (ref 0.0–0.1)
Basophils Relative: 1 %
Eosinophils Absolute: 0.3 10*3/uL (ref 0.0–0.5)
Eosinophils Relative: 3 %
HCT: 34.2 % — ABNORMAL LOW (ref 36.0–46.0)
Hemoglobin: 10.5 g/dL — ABNORMAL LOW (ref 12.0–15.0)
Immature Granulocytes: 0 %
Lymphocytes Relative: 22 %
Lymphs Abs: 1.6 10*3/uL (ref 0.7–4.0)
MCH: 26.4 pg (ref 26.0–34.0)
MCHC: 30.7 g/dL (ref 30.0–36.0)
MCV: 86.1 fL (ref 80.0–100.0)
Monocytes Absolute: 0.6 10*3/uL (ref 0.1–1.0)
Monocytes Relative: 9 %
Neutro Abs: 5 10*3/uL (ref 1.7–7.7)
Neutrophils Relative %: 65 %
Platelets: 258 10*3/uL (ref 150–400)
RBC: 3.97 MIL/uL (ref 3.87–5.11)
RDW: 19.6 % — ABNORMAL HIGH (ref 11.5–15.5)
WBC: 7.6 10*3/uL (ref 4.0–10.5)
nRBC: 0 % (ref 0.0–0.2)

## 2021-01-03 LAB — PHOSPHORUS: Phosphorus: 5.3 mg/dL — ABNORMAL HIGH (ref 2.5–4.6)

## 2021-01-03 LAB — COMPREHENSIVE METABOLIC PANEL
ALT: 19 U/L (ref 0–44)
AST: 23 U/L (ref 15–41)
Albumin: 3.1 g/dL — ABNORMAL LOW (ref 3.5–5.0)
Alkaline Phosphatase: 75 U/L (ref 38–126)
Anion gap: 14 (ref 5–15)
BUN: 19 mg/dL (ref 6–20)
CO2: 29 mmol/L (ref 22–32)
Calcium: 9.6 mg/dL (ref 8.9–10.3)
Chloride: 104 mmol/L (ref 98–111)
Creatinine, Ser: 0.57 mg/dL (ref 0.44–1.00)
GFR, Estimated: >60 mL/min (ref >60)
Glucose, Bld: 144 mg/dL — ABNORMAL HIGH (ref 70–99)
Potassium: 4 mmol/L (ref 3.5–5.1)
Sodium: 147 mmol/L — ABNORMAL HIGH (ref 135–145)
Total Bilirubin: 0.3 mg/dL (ref 0.3–1.2)
Total Protein: 8.1 g/dL (ref 6.5–8.1)

## 2021-01-03 LAB — GLUCOSE, CAPILLARY
Glucose-Capillary: 128 mg/dL — ABNORMAL HIGH (ref 70–99)
Glucose-Capillary: 139 mg/dL — ABNORMAL HIGH (ref 70–99)
Glucose-Capillary: 143 mg/dL — ABNORMAL HIGH (ref 70–99)
Glucose-Capillary: 146 mg/dL — ABNORMAL HIGH (ref 70–99)
Glucose-Capillary: 165 mg/dL — ABNORMAL HIGH (ref 70–99)
Glucose-Capillary: 176 mg/dL — ABNORMAL HIGH (ref 70–99)

## 2021-01-03 LAB — MAGNESIUM: Magnesium: 2.2 mg/dL (ref 1.7–2.4)

## 2021-01-03 MED ORDER — FREE WATER
300.0000 mL | Freq: Four times a day (QID) | Status: DC
  Administered 2021-01-03 – 2021-01-05 (×8): 300 mL

## 2021-01-03 NOTE — Progress Notes (Signed)
PROGRESS NOTE    [name] [name]  YZJ:[id number] DOB: 1966-02-25 DOA: 12/02/2020 PCP: Marinda Elk, MD    Brief Narrative:  Patient is a 55 year old female with acute cardiac arrest due to a severe acidosis and DKA. She had a witnessed V. fib arrest, and it shocked in the field. She was subsequently transferred to ICU,she was started on hypothermic protocol. She was a found to have anoxic brain injury. MRI brain was performed on 1/30, there is infarcts in the basal ganglion and white matter. Also has evidence of anoxic brain injury. Patient appear to also have seizures, has been seen by neurology. That have improved, EEG no longer see any seizure activity. Patient had tracheostomy and PEG tube placement on 1/22. [facility].   Assessment & Plan:   Active Problems:   Cardiac arrest ([place])   Acute respiratory failure ([facility])   Hypoxic ischemic encephalopathy   Endotracheal tube present  #1. Acute anoxic brain injury. Cardiac arrest secondary to V. fib. Possible seizure. Patient condition still stable, oxygenation is 5 L.  Patient was able to sit in a chair for 1 hour.  Continue physical therapy/Occupational Therapy.  Pending CIR transfer.  #2. Acute respiratory failure with hypoxemia and hypercapnia. Status post tracheostomy and PEG tube placement. Condition stable, patient tolerating tube feeding.  3.  Hypernatremia. Increase free water from tube feeding.  4.  Uncontrolled type 2 diabetes with hyperglycemia and diabetic ketoacidosis. Condition improved.  No change in treatment plan today.     DVT prophylaxis: Lovenox Code Status: Partial Family Communication:  Disposition Plan:  .   Status is: Inpatient  Remains inpatient appropriate because:Inpatient level of care appropriate due to severity of illness   Dispo: The patient is from: Home              Anticipated d/c is to: CIR              Anticipated d/c date is: 2 days               Patient currently is medically stable to d/c.   Difficult to place patient No        No intake/output data recorded. No intake/output data recorded.     Consultants:   None  Procedures: Trach/PEG  Antimicrobials: None  Subjective: Patient is nonverbal.  Spoke with the nurse, patient was able to sit in the chair for 1 hour, currently she is tired and went back to sleep. She had a normal looking bowel movement today. She is on 5 L oxygen over trach collar, no significant short of breath.  Objective: Vitals:   01/03/21 0458 01/03/21 0739 01/03/21 0745 01/03/21 1138  BP: (!) 144/97 (!) 144/86 128/86 120/78  Pulse: (!) 108 (!) 110 100 92  Resp: 19 18  18   Temp: 98 F (36.7 C) 98.5 F (36.9 C) 98.4 F (36.9 C) 98.4 F (36.9 C)  TempSrc: Oral Oral Oral   SpO2: 99% 97% 95% 100%  Weight: 78 kg     Height:       No intake or output data in the 24 hours ending 01/03/21 1337 Filed Weights   01/01/21 0500 01/02/21 0525 01/03/21 0458  Weight: 80.3 kg 79.6 kg 78 kg    Examination:  General exam: Appears calm and comfortable  Respiratory system: Clear to auscultation. Respiratory effort normal. Cardiovascular system: S1 & S2 heard, RRR. No JVD, murmurs, rubs, gallops or clicks. No pedal edema. Gastrointestinal system: Abdomen is nondistended,  soft and nontender. No organomegaly or masses felt. Normal bowel sounds heard. Central nervous system: sleepy and confused. No focal neurological deficits. Extremities: Symmetric 5 x 5 power. Skin: No rashes, lesions or ulcers .     Data Reviewed: I have personally reviewed following labs and imaging studies  CBC: Recent Labs  Lab 12/30/20 0416 12/31/20 0602 01/01/21 0458 01/02/21 0935 01/03/21 0428  WBC 6.9 6.4 6.9 7.8 7.6  NEUTROABS 4.6 3.5 4.1 5.1 5.0  HGB 10.0* 10.0* 10.4* 10.7* 10.5*  HCT 32.0* 33.5* 34.0* 34.9* 34.2*  MCV 85.6 85.9 85.0 85.5 86.1  PLT 310 273 292 276 785   Basic Metabolic Panel: Recent  Labs  Lab 12/30/20 0416 12/31/20 0602 01/01/21 0458 01/02/21 0935 01/03/21 0428  NA 142 140 144 145 147*  K 3.7 4.7 4.2 3.6 4.0  CL 103 100 102 102 104  CO2 28 28 29 28 29   GLUCOSE 129* 138* 152* 157* 144*  BUN 16 21* 14 21* 19  CREATININE 0.59 0.53 0.59 0.70 0.57  CALCIUM 9.3 9.8 9.8 9.8 9.6  MG 2.0 2.1 2.0 2.1 2.2  PHOS 4.0 4.7* 4.6 4.9* 5.3*   GFR: Estimated Creatinine Clearance: 76 mL/min (by C-G formula based on SCr of 0.57 mg/dL). Liver Function Tests: Recent Labs  Lab 12/30/20 0416 12/31/20 0602 01/01/21 0458 01/02/21 0935 01/03/21 0428  AST  --   --  28 24 23   ALT  --   --  22 19 19   ALKPHOS  --   --  84 82 75  BILITOT  --   --  0.3 0.4 0.3  PROT  --   --  8.2* 8.4* 8.1  ALBUMIN 2.9* 2.9* 3.1* 3.3* 3.1*   No results for input(s): LIPASE, AMYLASE in the last 168 hours. No results for input(s): AMMONIA in the last 168 hours. Coagulation Profile: No results for input(s): INR, PROTIME in the last 168 hours. Cardiac Enzymes: No results for input(s): CKTOTAL, CKMB, CKMBINDEX, TROPONINI in the last 168 hours. BNP (last 3 results) No results for input(s): PROBNP in the last 8760 hours. HbA1C: No results for input(s): HGBA1C in the last 72 hours. CBG: Recent Labs  Lab 01/02/21 2138 01/03/21 0015 01/03/21 0421 01/03/21 0741 01/03/21 1140  GLUCAP 157* 139* 143* 128* 176*   Lipid Profile: No results for input(s): CHOL, HDL, LDLCALC, TRIG, CHOLHDL, LDLDIRECT in the last 72 hours. Thyroid Function Tests: No results for input(s): TSH, T4TOTAL, FREET4, T3FREE, THYROIDAB in the last 72 hours. Anemia Panel: No results for input(s): VITAMINB12, FOLATE, FERRITIN, TIBC, IRON, RETICCTPCT in the last 72 hours. Sepsis Labs: No results for input(s): PROCALCITON, LATICACIDVEN in the last 168 hours.  No results found for this or any previous visit (from the past 240 hour(s)).       Radiology Studies: No results found.      Scheduled Meds: . sodium chloride    Intravenous Once  . alteplase  2 mg Intracatheter Once  . amLODipine  10 mg Per Tube Daily  . atorvastatin  80 mg Per Tube Daily  . chlorhexidine gluconate (MEDLINE KIT)  15 mL Mouth Rinse BID  . Chlorhexidine Gluconate Cloth  6 each Topical Daily  . clonazePAM  1 mg Per Tube BID  . enoxaparin (LOVENOX) injection  40 mg Subcutaneous Q24H  . feeding supplement ([place] STANDARD 1.4)  325 mL Per Tube QID  . free water  300 mL Per Tube QID  . insulin aspart  0-15 Units Subcutaneous Q4H  .  insulin glargine  5 Units Subcutaneous Daily  . levETIRAcetam  1,000 mg Per Tube BID  . mouth rinse  15 mL Mouth Rinse 10 times per day  . oxyCODONE  5 mg Per Tube Q6H  . pantoprazole sodium  40 mg Per Tube QHS  . polyethylene glycol  17 g Per Tube Daily  . QUEtiapine  25 mg Per Tube QHS  . senna-docusate  1 tablet Per Tube Daily  . sodium chloride flush  10-40 mL Intracatheter Q12H   Continuous Infusions: . sodium chloride 250 mL (12/19/20 0449)     LOS: 32 days    Time spent: 25 minutes    Sharen Hones, MD [facility]   To contact the attending provider between 7A-7P or the covering provider during after hours 7P-7A, please log into the web site [email or web address] and access using universal [place] password for that web site. If you do not have the password, please call the hospital operator.  01/03/2021, 1:37 PM

## 2021-01-03 NOTE — Progress Notes (Signed)
Occupational Therapy Treatment Patient Details Name: [name] [name] MRN: [id number] DOB: 12-22-1965 Today's Date: 01/03/2021    History of present illness Pt is a 55 year old female presented to the ED via EMS post witnessed cardiac arrest. ROSC achieved following 3 defibrillations and one round of epinephrine. Patient found to have significant hypokalemia on arrival. Given witnessed arrest, patient Code ICE.  S/p trach and peg tube placement.  Workup showed anoxic brain injury. Pt also showed facial twitching that could be myoclonic versus partial seizure.   OT comments  Upon entering the room, pt supine in bed with RN present providing medications. Pt squeezed therapist hand and nods head yes to plan of getting into chair this session. Max A to EOB with increased time and mod multimodal cuing. Pt initially needing mod A for static sitting balance on EOB progressing to min guard. Total A for slide board placement. Use of second person to stabilize equipment and assist with hips as needed. Lateral scoot transfer on slide board towards the R with pt initiating small push through L UE during transfer with increased time. Segmental scoots to allow pt to sequence and reposition self on board to regain sitting balance. Pt positioned in recliner chair with hand over hand assistance for self care tasks with use of L UE. Seat belt alarm utilized for safety in recliner chair. RN present at end of session. Plan for pt to sit up in recliner chair for at least 1 hour. Pt continues to benefit from OT intervention with remains appropriate for CIR at discharge to continue to address functional deficits.    Follow Up Recommendations  CIR    Equipment Recommendations  Other (comment)       Precautions / Restrictions Precautions Precautions: Fall Precaution Comments: trach collar 5 L Restrictions Weight Bearing Restrictions: No       Mobility Bed Mobility Overal bed mobility: Needs Assistance Bed  Mobility: Supine to Sit     Supine to sit: Max assist;HOB elevated     General bed mobility comments: Pt moves L LE to EOB. Pt needing assistance with trunk and R LE.  Transfers Overall transfer level: Needs assistance Equipment used: Sliding board Transfers: Lateral/Scoot Transfers          Lateral/Scoot Transfers: Max assist;+2 safety/equipment;With slide board;+2 physical assistance      Balance Overall balance assessment: Needs assistance   Sitting balance-Leahy Scale: Fair Sitting balance - Comments: varying levels of seated balance throughout ranging from min guard - mod A                                   ADL either performed or assessed with clinical judgement        Vision Patient Visual Report: No change from baseline            Cognition Arousal/Alertness: Awake/alert Behavior During Therapy: Flat affect Overall Cognitive Status: Difficult to assess                                 General Comments: Pt is non verbal but cooperative. She follows commands with increased time. She does squeeze hands and nods head yes/no.                   Pertinent Vitals/ Pain       Pain Assessment: Faces Faces Pain Scale:  No hurt         Frequency  Min 3X/week        Progress Toward Goals  OT Goals(current goals can now be found in the care plan section)  Progress towards OT goals: Progressing toward goals  Acute Rehab OT Goals Patient Stated Goal: to improve as much as possible OT Goal Formulation: With family Time For Goal Achievement: 01/09/21 Potential to Achieve Goals: [place] Discharge plan remains appropriate       AM-PAC OT "6 Clicks" Daily Activity     Outcome Measure   Help from another person eating meals?: Total Help from another person taking care of personal grooming?: Total Help from another person toileting, which includes using toliet, bedpan, or urinal?: Total Help from another person bathing  (including washing, rinsing, drying)?: Total Help from another person to put on and taking off regular upper body clothing?: Total Help from another person to put on and taking off regular lower body clothing?: Total 6 Click Score: 6    End of Session Equipment Utilized During Treatment: Oxygen (trach 5 Ls)  OT Visit Diagnosis: Unsteadiness on feet (R26.81);Muscle weakness (generalized) (M62.81)   Activity Tolerance Patient tolerated treatment well   Patient Left with call bell/phone within reach;in chair;with chair alarm set;Other (comment) (use of posey seat belt alarm)   Nurse Communication Precautions;Other (comment) (transfer)        Time: 6834-1962 OT Time Calculation (min): 27 min  Charges: OT General Charges $OT Visit: 1 Visit OT Treatments $Neuromuscular Re-education: 23-37 mins  Darleen Crocker, MS, OTR/L , CBIS ascom [phone]  01/03/21, 10:51 AM

## 2021-01-03 NOTE — Progress Notes (Incomplete)
   01/02/21 0821  Assess: MEWS Score  Temp 99.8 F (37.7 C)  BP (!) 141/81  Pulse Rate (!) 124  Resp (!) 22  SpO2 97 %  O2 Device Tracheostomy Collar  Assess: MEWS Score  MEWS Temp 0  MEWS Systolic 0  MEWS Pulse 2  MEWS RR 1  MEWS LOC 0  MEWS Score 3  MEWS Score Color Yellow  Assess: if the MEWS score is Yellow or Red  Were vital signs taken at a resting state? Yes  Focused Assessment Change from prior assessment (see assessment flowsheet)  Early Detection of Sepsis Score *See Row Information* Low  MEWS guidelines implemented *See Row Information* Yes  Treat  MEWS Interventions Administered scheduled meds/treatments  Pain Scale Faces  Take Vital Signs  Increase Vital Sign Frequency  Yellow: Q 2hr X 2 then Q 4hr X 2, if remains yellow, continue Q 4hrs  Escalate  MEWS: Escalate Yellow: discuss with charge nurse/RN and consider discussing with provider and RRT  Notify: Charge Nurse/RN  Name of Charge Nurse/RN Notified [place], CN  Date Charge Nurse/RN Notified 01/02/21  Time Charge Nurse/RN Notified 0840  Inserted for [employer]

## 2021-01-03 NOTE — Progress Notes (Signed)
Inpatient Rehab Admissions Coordinator:   I have no beds available for this patient to admit to CIR today.  Will continue to follow for timing of potential admission pending bed availability, hopefully Monday.   Shann Medal, PT, DPT Admissions Coordinator [phone] 01/03/21  10:34 AM

## 2021-01-04 DIAGNOSIS — I10 Essential (primary) hypertension: Secondary | ICD-10-CM

## 2021-01-04 LAB — COMPREHENSIVE METABOLIC PANEL
ALT: 16 U/L (ref 0–44)
AST: 23 U/L (ref 15–41)
Albumin: 3 g/dL — ABNORMAL LOW (ref 3.5–5.0)
Alkaline Phosphatase: 75 U/L (ref 38–126)
Anion gap: 13 (ref 5–15)
BUN: 19 mg/dL (ref 6–20)
CO2: 30 mmol/L (ref 22–32)
Calcium: 9.6 mg/dL (ref 8.9–10.3)
Chloride: 106 mmol/L (ref 98–111)
Creatinine, Ser: 0.61 mg/dL (ref 0.44–1.00)
GFR, Estimated: >60 mL/min (ref >60)
Glucose, Bld: 116 mg/dL — ABNORMAL HIGH (ref 70–99)
Potassium: 3.8 mmol/L (ref 3.5–5.1)
Sodium: 149 mmol/L — ABNORMAL HIGH (ref 135–145)
Total Bilirubin: 0.3 mg/dL (ref 0.3–1.2)
Total Protein: 7.8 g/dL (ref 6.5–8.1)

## 2021-01-04 LAB — CBC WITH DIFFERENTIAL/PLATELET
Abs Immature Granulocytes: 0.02 10*3/uL (ref 0.00–0.07)
Basophils Absolute: 0.1 10*3/uL (ref 0.0–0.1)
Basophils Relative: 1 %
Eosinophils Absolute: 0.3 10*3/uL (ref 0.0–0.5)
Eosinophils Relative: 4 %
HCT: 34.3 % — ABNORMAL LOW (ref 36.0–46.0)
Hemoglobin: 10.2 g/dL — ABNORMAL LOW (ref 12.0–15.0)
Immature Granulocytes: 0 %
Lymphocytes Relative: 25 %
Lymphs Abs: 1.9 10*3/uL (ref 0.7–4.0)
MCH: 26 pg (ref 26.0–34.0)
MCHC: 29.7 g/dL — ABNORMAL LOW (ref 30.0–36.0)
MCV: 87.5 fL (ref 80.0–100.0)
Monocytes Absolute: 0.6 10*3/uL (ref 0.1–1.0)
Monocytes Relative: 8 %
Neutro Abs: 4.7 10*3/uL (ref 1.7–7.7)
Neutrophils Relative %: 62 %
Platelets: 245 10*3/uL (ref 150–400)
RBC: 3.92 MIL/uL (ref 3.87–5.11)
RDW: 19.8 % — ABNORMAL HIGH (ref 11.5–15.5)
WBC: 7.6 10*3/uL (ref 4.0–10.5)
nRBC: 0 % (ref 0.0–0.2)

## 2021-01-04 LAB — GLUCOSE, CAPILLARY
Glucose-Capillary: 119 mg/dL — ABNORMAL HIGH (ref 70–99)
Glucose-Capillary: 128 mg/dL — ABNORMAL HIGH (ref 70–99)
Glucose-Capillary: 137 mg/dL — ABNORMAL HIGH (ref 70–99)
Glucose-Capillary: 137 mg/dL — ABNORMAL HIGH (ref 70–99)
Glucose-Capillary: 153 mg/dL — ABNORMAL HIGH (ref 70–99)
Glucose-Capillary: 178 mg/dL — ABNORMAL HIGH (ref 70–99)

## 2021-01-04 LAB — MAGNESIUM: Magnesium: 2.2 mg/dL (ref 1.7–2.4)

## 2021-01-04 LAB — PHOSPHORUS: Phosphorus: 5.2 mg/dL — ABNORMAL HIGH (ref 2.5–4.6)

## 2021-01-04 MED ORDER — DEXTROSE 5 % IV SOLN: INTRAVENOUS | Status: DC

## 2021-01-04 NOTE — Progress Notes (Signed)
Physical Therapy Treatment Patient Details Name: [name] [name] MRN: [id number] DOB: Aug 02, 1966 Today's Date: 01/04/2021    History of Present Illness Pt is a 55 year old female presented to the ED via EMS post witnessed cardiac arrest. ROSC achieved following 3 defibrillations and one round of epinephrine. Patient found to have significant hypokalemia on arrival. Given witnessed arrest, patient Code ICE.  S/p trach and peg tube placement.  Workup showed anoxic brain injury. Pt also showed facial twitching that could be myoclonic versus partial seizure.    PT Comments    Pt was sitting semi perpendicular in bed with BLEs resting on L railing and pt slouching. RN tech assisted with repositioning prior to session. PT was none verbal throughout. Only command she followed was blinking twice when asked. Unable to squeeze or perform active movements to commands. She did nod no one time when asked about pain later in session. Pt required total assist to fully progress to EOB short sit. Constant assist to maintain balance. Four pillows stacked on R side of pt due to severe R lateral lean in sitting. Feet supported on floor throughout sitting EOB. Unsafe to progress OOB. Did message MD for [facility] boots to prevent contracture in ankles. Pt was repositioned in bed post session. Assisted RN tech with hygiene care after incontinence episode. Acute PT recommends DC to CIR. Will continue to follow and progress as able per POC.        Follow Up Recommendations  CIR     Equipment Recommendations  Other (comment) (defer to next level of care)    Recommendations for Other Services       Precautions / Restrictions Precautions Precautions: Fall Precaution Comments: trach collar 5 L    Mobility  Bed Mobility Overal bed mobility: Needs Assistance Bed Mobility: Supine to Sit     Supine to sit: Max assist;Total assist;HOB elevated;+2 for safety/equipment Sit to supine: Total assist;+2 for  safety/equipment;+2 for physical assistance   General bed mobility comments: Pt required more assistance today than previous session.    Transfers    General transfer comment: unsafe to trial. pt with heavy R lateral lean with static sitting EOB. required 4 pillow support under R UE to maintain sitting balance  EOB      Balance Overall balance assessment: Needs assistance Sitting-balance support: Feet supported;Single extremity supported (severe R lateral lean) Sitting balance-Leahy Scale: Poor Sitting balance - Comments: needs constant assistance this date to maintain sitting balance EOB.         Cognition Arousal/Alertness: Awake/alert Behavior During Therapy: Flat affect Overall Cognitive Status: Difficult to assess      General Comments: pt was non verbal throughout session. Only command she responded to was when asked to blink twice.         General Comments General comments (skin integrity, edema, etc.): MD messaged for [facility] boot order      Pertinent Vitals/Pain Pain Assessment:  (unable to rate) Pain Intervention(s): Limited activity within patient's tolerance;Monitored during session;Premedicated before session;Repositioned           PT Goals (current goals can now be found in the care plan section) Acute Rehab PT Goals Patient Stated Goal: none stated Progress towards PT goals: Not progressing toward goals - comment (More assistance to maintain balance in sitting)    Frequency    Min 2X/week      PT Plan Current plan remains appropriate    Co-evaluation     PT goals addressed during session: Mobility/safety  with mobility;Balance;Proper use of DME        AM-PAC PT "6 Clicks" Mobility   Outcome Measure  Help needed turning from your back to your side while in a flat bed without using bedrails?: Total Help needed moving from lying on your back to sitting on the side of a flat bed without using bedrails?: Total Help needed moving to and from a  bed to a chair (including a wheelchair)?: Total Help needed standing up from a chair using your arms (e.g., wheelchair or bedside chair)?: Total Help needed to walk in hospital room?: Total Help needed climbing 3-5 steps with a railing? : Total 6 Click Score: 6    End of Session Equipment Utilized During Treatment: Oxygen (5 L trach collar 28% throughout) Activity Tolerance: Patient limited by fatigue Patient left: in bed;with call bell/phone within reach;with nursing/sitter in room;with bed alarm set (RN tech in room) Nurse Communication: Mobility status PT Visit Diagnosis: Other abnormalities of gait and mobility (R26.89);Muscle weakness (generalized) (M62.81);Difficulty in walking, not elsewhere classified (R26.2);Other symptoms and signs involving the nervous system (R29.898)     Time: 1130-1155 PT Time Calculation (min) (ACUTE ONLY): 25 min  Charges:  $Therapeutic Activity: 8-22 mins $Neuromuscular Re-education: 8-22 mins                     Julaine Fusi PTA 01/04/21, 1:43 PM

## 2021-01-04 NOTE — Progress Notes (Addendum)
PROGRESS NOTE    [name] [name]  UKG:[id number] DOB: 28-Sep-1966 DOA: 12/02/2020 PCP: Marinda Elk, MD    Brief Narrative:  Patient is a 55 year old female with acute cardiac arrest due to a severe acidosis and DKA. She had a witnessed V. fib arrest, and it shocked in the field. She was subsequently transferred to ICU,she was started on hypothermic protocol. She was a found to have anoxic brain injury. MRI brain was performed on 1/30, there is infarcts in the basal ganglion and white matter. Also has evidence of anoxic brain injury. Patient appear to also have seizures, has been seen by neurology. That have improved, EEG no longer see any seizure activity. Patient had tracheostomy and PEG tube placement on 1/22. [facility]is [facility].   Assessment & Plan:   Active Problems:   Cardiac arrest ([place])   Acute respiratory failure ([facility])   Hypoxic ischemic encephalopathy   Endotracheal tube present   Essential hypertension   #1. Acute anoxic brain injury. Cardiac arrest secondary to V. Fib. Patient does not have additional seizure activity.  Stable on 5 L oxygen.  Continue physical therapy and Occupational Therapy.  Will transfer to CIR when bed is available.  #2. Acute respiratory failure with hypoxemia and hypercapnia. Status post tracheostomy and PEG tube placement. Patient still on 5 L oxygen, condition stable.  3.  Hypernatremia. Sodium level worse today, will give 737m of D5 water.  Free water increased from tube feeding.  4.  Uncontrolled type 2 diabetes with hyperglycemia and diabetic ketoacidosis. Condition improved.  1345> Patient had feeds material suctioned up from trach, She could not stay with head elevated, she coughed, then aspirated. I will hold tube feed temporarily. Continue fluids for now.   DVT prophylaxis: Lovenox Code Status: Partial Family Communication:  Disposition Plan:  .   Status is: Inpatient  Remains inpatient  appropriate because:Inpatient level of care appropriate due to severity of illness   Dispo: The patient is from: Home              Anticipated d/c is to: CIR              Anticipated d/c date is: 2 days              Patient currently is medically stable to d/c.   Difficult to place patient No        No intake/output data recorded. No intake/output data recorded.     Consultants:   None  Procedures: None  Antimicrobials:None  Subjective: Patient is nonverbal.  She does not seem to have any respiratory distress.  On 5 L oxygen.  She is tolerating tube feeding. No nausea or vomiting. No fever or chills  Objective: Vitals:   01/03/21 2105 01/04/21 0434 01/04/21 0755 01/04/21 1152  BP:  (!) 147/83 136/78 117/78  Pulse:  (!) 107 100 93  Resp:  _0 Temp:  98.8 F (37.1 C) 98.1 F (36.7 C) 98.4 F (36.9 C)  TempSrc:   Axillary   SpO2: 96% 99% 96% 95%  Weight:  77.9 kg    Height:       No intake or output data in the 24 hours ending 01/04/21 1313 Filed Weights   01/02/21 0525 01/03/21 0458 01/04/21 0434  Weight: 79.6 kg 78 kg 77.9 kg    Examination:  General exam: Appears calm and comfortable  Respiratory system: Clear to auscultation. Respiratory effort normal. Cardiovascular system: S1 & S2 heard, RRR. No  JVD, murmurs, rubs, gallops or clicks. No pedal edema. Gastrointestinal system: Abdomen is nondistended, soft and nontender. No organomegaly or masses felt. Normal bowel sounds heard. Central nervous system: Alert and none verbal. No focal neurological deficits. Extremities: Symmetric 5 x 5 power. Skin: No rashes, lesions or ulcers .     Data Reviewed: I have personally reviewed following labs and imaging studies  CBC: Recent Labs  Lab 12/31/20 0602 01/01/21 0458 01/02/21 0935 01/03/21 0428 01/04/21 0421  WBC 6.4 6.9 7.8 7.6 7.6  NEUTROABS 3.5 4.1 5.1 5.0 4.7  HGB 10.0* 10.4* 10.7* 10.5* 10.2*  HCT 33.5* 34.0* 34.9* 34.2* 34.3*  MCV 85.9  85.0 85.5 86.1 87.5  PLT 273 292 276 258 128   Basic Metabolic Panel: Recent Labs  Lab 12/31/20 0602 01/01/21 0458 01/02/21 0935 01/03/21 0428 01/04/21 0421  NA 140 144 145 147* 149*  K 4.7 4.2 3.6 4.0 3.8  CL 100 102 102 104 106  CO2 _0 GLUCOSE 138* 152* 157* 144* 116*  BUN 21* 14 21* 19 19  CREATININE 0.53 0.59 0.70 0.57 0.61  CALCIUM 9.8 9.8 9.8 9.6 9.6  MG 2.1 2.0 2.1 2.2 2.2  PHOS 4.7* 4.6 4.9* 5.3* 5.2*   GFR: Estimated Creatinine Clearance: 75.9 mL/min (by C-G formula based on SCr of 0.61 mg/dL). Liver Function Tests: Recent Labs  Lab 12/31/20 0602 01/01/21 0458 01/02/21 0935 01/03/21 0428 01/04/21 0421  AST  --  _1 ALT  --  _2 ALKPHOS  --  84 82 75 75  BILITOT  --  0.3 0.4 0.3 0.3  PROT  --  8.2* 8.4* 8.1 7.8  ALBUMIN 2.9* 3.1* 3.3* 3.1* 3.0*   No results for input(s): LIPASE, AMYLASE in the last 168 hours. No results for input(s): AMMONIA in the last 168 hours. Coagulation Profile: No results for input(s): INR, PROTIME in the last 168 hours. Cardiac Enzymes: No results for input(s): CKTOTAL, CKMB, CKMBINDEX, TROPONINI in the last 168 hours. BNP (last 3 results) No results for input(s): PROBNP in the last 8760 hours. HbA1C: No results for input(s): HGBA1C in the last 72 hours. CBG: Recent Labs  Lab 01/03/21 2018 01/03/21 2334 01/04/21 0433 01/04/21 0758 01/04/21 1152  GLUCAP 165* 137* 119* 137* 178*   Lipid Profile: No results for input(s): CHOL, HDL, LDLCALC, TRIG, CHOLHDL, LDLDIRECT in the last 72 hours. Thyroid Function Tests: No results for input(s): TSH, T4TOTAL, FREET4, T3FREE, THYROIDAB in the last 72 hours. Anemia Panel: No results for input(s): VITAMINB12, FOLATE, FERRITIN, TIBC, IRON, RETICCTPCT in the last 72 hours. Sepsis Labs: No results for input(s): PROCALCITON, LATICACIDVEN in the last 168 hours.  No results found for this or any previous visit (from the past 240 hour(s)).        Radiology Studies: No results found.      Scheduled Meds: . sodium chloride   Intravenous Once  . alteplase  2 mg Intracatheter Once  . amLODipine  10 mg Per Tube Daily  . atorvastatin  80 mg Per Tube Daily  . chlorhexidine gluconate (MEDLINE KIT)  15 mL Mouth Rinse BID  . Chlorhexidine Gluconate Cloth  6 each Topical Daily  . clonazePAM  1 mg Per Tube BID  . enoxaparin (LOVENOX) injection  40 mg Subcutaneous Q24H  . feeding supplement ([employer] STANDARD 1.4)  325 mL Per Tube QID  . free water  300 mL Per Tube QID  . insulin aspart  0-15 Units Subcutaneous Q4H  . insulin glargine  5 Units Subcutaneous Daily  . levETIRAcetam  1,000 mg Per Tube BID  . mouth rinse  15 mL Mouth Rinse 10 times per day  . oxyCODONE  5 mg Per Tube Q6H  . pantoprazole sodium  40 mg Per Tube QHS  . polyethylene glycol  17 g Per Tube Daily  . QUEtiapine  25 mg Per Tube QHS  . senna-docusate  1 tablet Per Tube Daily  . sodium chloride flush  10-40 mL Intracatheter Q12H   Continuous Infusions: . sodium chloride 250 mL (12/19/20 0449)  . dextrose 100 mL/hr at 01/04/21 0937     LOS: 33 days    Time spent: 25 minutes    Sharen Hones, MD [facility]   To contact the attending provider between 7A-7P or the covering provider during after hours 7P-7A, please log into the web site [email or web address] and access using universal [place] password for that web site. If you do not have the password, please call the hospital operator.  01/04/2021, 1:13 PM

## 2021-01-05 ENCOUNTER — Inpatient Hospital Stay: Payer: Self-pay

## 2021-01-05 DIAGNOSIS — Z9889 Other specified postprocedural states: Secondary | ICD-10-CM

## 2021-01-05 DIAGNOSIS — E1110 Type 2 diabetes mellitus with ketoacidosis without coma: Secondary | ICD-10-CM

## 2021-01-05 DIAGNOSIS — I4901 Ventricular fibrillation: Principal | ICD-10-CM

## 2021-01-05 DIAGNOSIS — R4182 Altered mental status, unspecified: Secondary | ICD-10-CM

## 2021-01-05 DIAGNOSIS — J690 Pneumonitis due to inhalation of food and vomit: Secondary | ICD-10-CM

## 2021-01-05 LAB — CBC WITH DIFFERENTIAL/PLATELET
Abs Immature Granulocytes: 0.02 10*3/uL (ref 0.00–0.07)
Basophils Absolute: 0 10*3/uL (ref 0.0–0.1)
Basophils Relative: 1 %
Eosinophils Absolute: 0.2 10*3/uL (ref 0.0–0.5)
Eosinophils Relative: 3 %
HCT: 35.4 % — ABNORMAL LOW (ref 36.0–46.0)
Hemoglobin: 10.7 g/dL — ABNORMAL LOW (ref 12.0–15.0)
Immature Granulocytes: 0 %
Lymphocytes Relative: 24 %
Lymphs Abs: 1.7 10*3/uL (ref 0.7–4.0)
MCH: 26.2 pg (ref 26.0–34.0)
MCHC: 30.2 g/dL (ref 30.0–36.0)
MCV: 86.6 fL (ref 80.0–100.0)
Monocytes Absolute: 0.5 10*3/uL (ref 0.1–1.0)
Monocytes Relative: 7 %
Neutro Abs: 4.9 10*3/uL (ref 1.7–7.7)
Neutrophils Relative %: 65 %
Platelets: 210 10*3/uL (ref 150–400)
RBC: 4.09 MIL/uL (ref 3.87–5.11)
RDW: 19.9 % — ABNORMAL HIGH (ref 11.5–15.5)
WBC: 7.4 10*3/uL (ref 4.0–10.5)
nRBC: 0 % (ref 0.0–0.2)

## 2021-01-05 LAB — COMPREHENSIVE METABOLIC PANEL
ALT: 16 U/L (ref 0–44)
AST: 27 U/L (ref 15–41)
Albumin: 3.1 g/dL — ABNORMAL LOW (ref 3.5–5.0)
Alkaline Phosphatase: 70 U/L (ref 38–126)
Anion gap: 10 (ref 5–15)
BUN: 15 mg/dL (ref 6–20)
CO2: 29 mmol/L (ref 22–32)
Calcium: 9.3 mg/dL (ref 8.9–10.3)
Chloride: 104 mmol/L (ref 98–111)
Creatinine, Ser: 0.58 mg/dL (ref 0.44–1.00)
GFR, Estimated: >60 mL/min (ref >60)
Glucose, Bld: 145 mg/dL — ABNORMAL HIGH (ref 70–99)
Potassium: 3.5 mmol/L (ref 3.5–5.1)
Sodium: 143 mmol/L (ref 135–145)
Total Bilirubin: 0.5 mg/dL (ref 0.3–1.2)
Total Protein: 7.7 g/dL (ref 6.5–8.1)

## 2021-01-05 LAB — GLUCOSE, CAPILLARY
Glucose-Capillary: 117 mg/dL — ABNORMAL HIGH (ref 70–99)
Glucose-Capillary: 118 mg/dL — ABNORMAL HIGH (ref 70–99)
Glucose-Capillary: 133 mg/dL — ABNORMAL HIGH (ref 70–99)
Glucose-Capillary: 133 mg/dL — ABNORMAL HIGH (ref 70–99)
Glucose-Capillary: 138 mg/dL — ABNORMAL HIGH (ref 70–99)
Glucose-Capillary: 154 mg/dL — ABNORMAL HIGH (ref 70–99)

## 2021-01-05 LAB — PHOSPHORUS: Phosphorus: 5.4 mg/dL — ABNORMAL HIGH (ref 2.5–4.6)

## 2021-01-05 LAB — MAGNESIUM: Magnesium: 2.2 mg/dL (ref 1.7–2.4)

## 2021-01-05 IMAGING — DX DG CHEST 1V
1 series · 1 of 1 positions shown · non-contrast
Comparison: [DATE]

CLINICAL DATA: Recent cardiac arrest with tracheostomy in place

EXAM:
CHEST  1 VIEW

[chest ap]
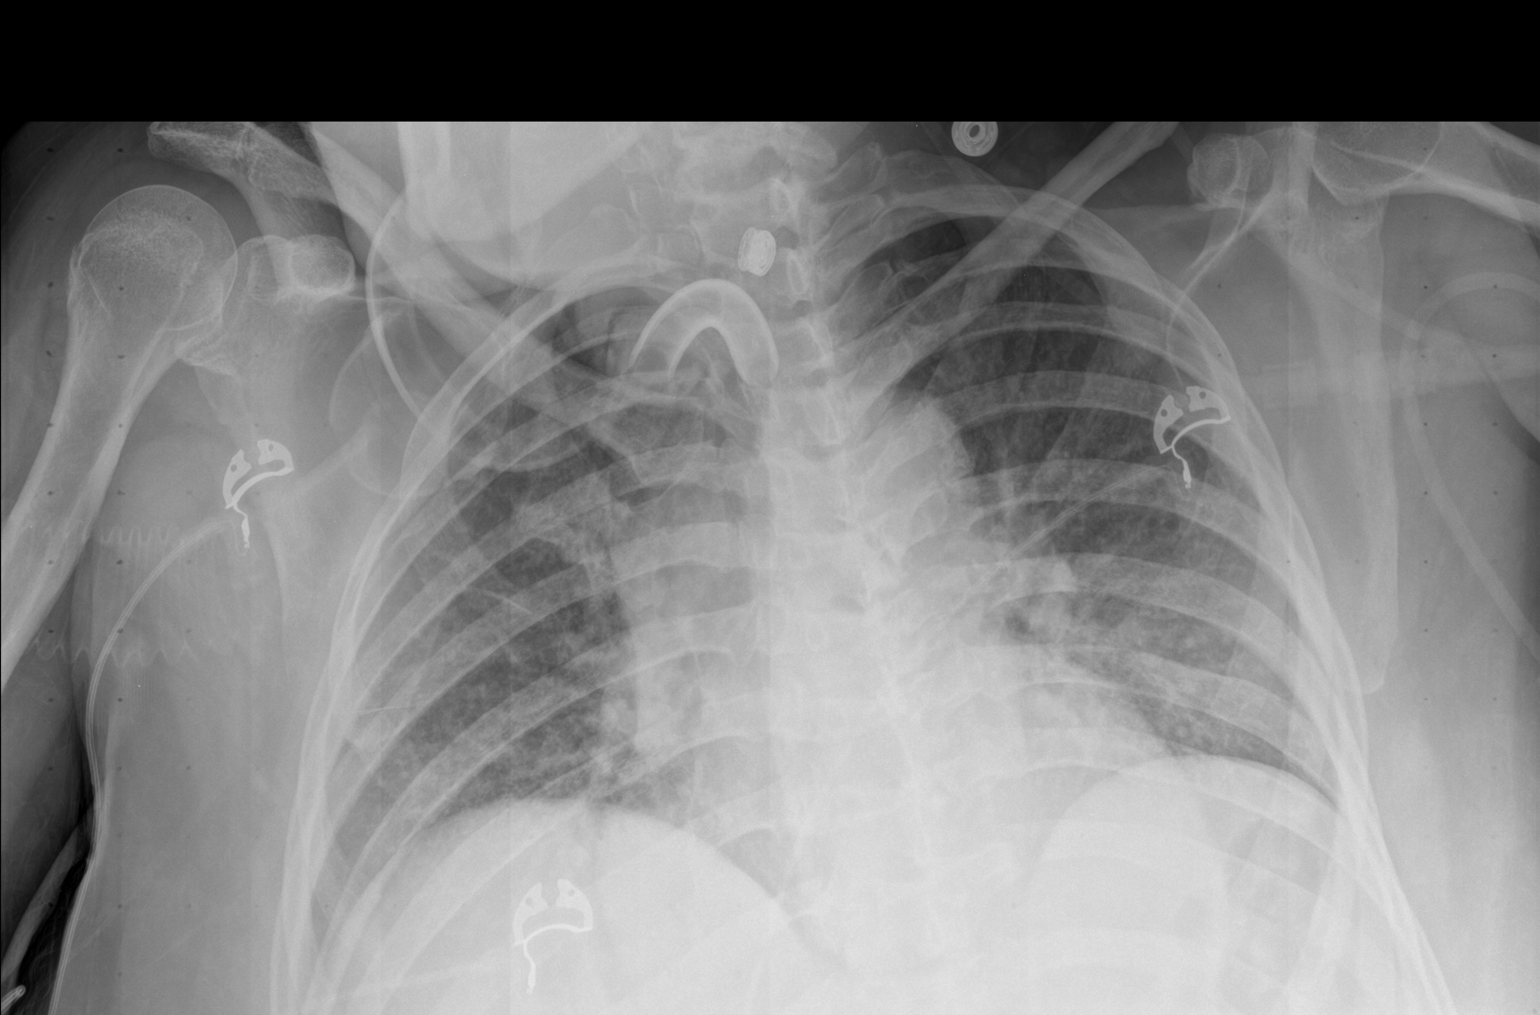

[1 of 1 positions shown; findings below may reference images not displayed]

FINDINGS: Tracheostomy catheter tip is 4.5 cm above the carina. No
pneumothorax. There is airspace opacity in portions of the left mid
and lower lung region. Lungs elsewhere clear. Heart is upper normal
in size with pulmonary vascularity normal. No adenopathy. No bone
lesions.
IMPRESSION: Ill-defined opacity in portions of the left mid and lower lung
regions. Differential considerations include pneumonia and
aspiration for this appearance. Lungs elsewhere clear. Heart upper
normal in size. Tracheostomy present. No pneumothorax.

## 2021-01-05 MED ORDER — SODIUM CHLORIDE 0.9 % IV SOLN
1.5000 g | Freq: Four times a day (QID) | INTRAVENOUS | Status: AC
  Administered 2021-01-05 – 2021-01-10 (×20): 1.5 g via INTRAVENOUS
  Filled 2021-01-05: qty 1.5
  Filled 2021-01-05 (×3): qty 4
  Filled 2021-01-05 (×2): qty 0.38
  Filled 2021-01-05: qty 1.5
  Filled 2021-01-05 (×5): qty 4
  Filled 2021-01-05: qty 1.5
  Filled 2021-01-05: qty 0.38
  Filled 2021-01-05 (×2): qty 4
  Filled 2021-01-05: qty 0.38
  Filled 2021-01-05: qty 4
  Filled 2021-01-05: qty 1.5
  Filled 2021-01-05: qty 4
  Filled 2021-01-05: qty 0.38
  Filled 2021-01-05: qty 4

## 2021-01-05 MED ORDER — KCL IN DEXTROSE-NACL 20-5-0.9 MEQ/L-%-% IV SOLN
INTRAVENOUS | Status: DC
  Filled 2021-01-05 (×6): qty 1000

## 2021-01-05 NOTE — Consult Note (Signed)
Patient ID: [name] [name], female   DOB: Mar 28, 1966, 55 y.o.   MRN: [id number]  HPI [name] [name] is a 55 y.o. female in in consultation at the request of Dr.Zhang case d/w him in detail.  He was admitted on January 10 due to cardiac arrest presumably from severe acidosis DKA and ventricular fibrillation.  He had a prolonged hospitalization and ICU care and did have significant neurological sequela from anoxic brain injury.  3 and half weeks ago he did have a trach as well as a PEG tube by Dr. Haig Prophet and Dr. Lysle Pearl. More recently there has been issues with the PEG and last night specifically the patient seem to have aspirated from the tube feeds.  There was tube feeds within the trach.  Searcy x-ray personally reviewed showing evidence of a left infiltrate consistent with aspiration.  Also reviewed the endoscopic images showing appropriate position of the PEG tube. Currently she is able to follow very simple commands still but written and is able to open her eyes.  She is not able to talk or do anything else.  Albumin is 3.1 and her glucose is 145 the rest of the CMP is completely normal.  BC shows a hemoglobin of 10.6 and the rest if normal. She Did have prior surgical history significant for midline C-section. Per occupational therapy note 2 days agoPt moves L LE to EOB. Pt needing assistance with trunk and R LE Pt is non verbal but cooperative. She follows commands with increased time. She does squeeze hands and nods head yes/no. Please note I have also personally reviewed endoscopic notes as well as notes from physical therapy and Occupational Therapy.  HPI  Past Medical History:  Diagnosis Date  . Hypertension     Past Surgical History:  Procedure Laterality Date  . PEG PLACEMENT N/A 12/13/2020   Procedure: PERCUTANEOUS ENDOSCOPIC GASTROSTOMY (PEG) PLACEMENT;  Surgeon: Lesly Rubenstein, MD;  Location: [facility] ENDOSCOPY;  Service: Endoscopy;  Laterality: N/A;  . TRACHEOSTOMY TUBE PLACEMENT  N/A 12/13/2020   Procedure: TRACHEOSTOMY;  Surgeon: Clyde Canterbury, MD;  Location: [facility] ORS;  Service: ENT;  Laterality: N/A;    Family History  Problem Relation Age of Onset  . Hypertension Mother   . Leukemia Mother   . Diabetes Mother   . Healthy Father     Social History Social History   Tobacco Use  . Smoking status: Former Smoker    Quit date: 2007    Years since quitting: 15.1  . Smokeless tobacco: Never Used  Vaping Use  . Vaping Use: Never used  Substance Use Topics  . Alcohol use: No  . Drug use: Never    No Known Allergies  Current Facility-Administered Medications  Medication Dose Route Frequency Provider Last Rate Last Admin  . 0.9 %  sodium chloride infusion (Manually program via Guardrails IV Fluids)   Intravenous Once Rust-Chester, Britton L, NP      . 0.9 %  sodium chloride infusion  250 mL Intravenous Continuous Darel Hong D, NP 10 mL/hr at 12/19/20 0449 250 mL at 12/19/20 0449  . acetaminophen (TYLENOL) tablet 650 mg  650 mg Per Tube Q8H PRN Rust-Chester, Britton L, NP   650 mg at 01/05/21 0051  . alteplase (CATHFLO ACTIVASE) injection 2 mg  2 mg Intracatheter Once Flora Lipps, MD      . amLODipine (NORVASC) tablet 10 mg  10 mg Per Tube Daily Flora Lipps, MD   10 mg at 01/05/21 0926  . ampicillin-sulbactam (  UNASYN) 1.5 g in sodium chloride 0.9 % 100 mL IVPB  1.5 g Intravenous Q6H Sharen Hones, MD 200 mL/hr at 01/05/21 1359 1.5 g at 01/05/21 1359  . atorvastatin (LIPITOR) tablet 80 mg  80 mg Per Tube Daily Flora Lipps, MD   80 mg at 01/05/21 0926  . bisacodyl (DULCOLAX) suppository 10 mg  10 mg Rectal Daily PRN Sharion Settler, NP      . chlorhexidine gluconate (MEDLINE KIT) (PERIDEX) 0.12 % solution 15 mL  15 mL Mouth Rinse BID Flora Lipps, MD   15 mL at 01/05/21 0926  . Chlorhexidine Gluconate Cloth 2 % PADS 6 each  6 each Topical Daily Flora Lipps, MD   6 each at 01/05/21 [phone]  . clonazePAM (KLONOPIN) tablet 1 mg  1 mg Per Tube BID Nelle Don, MD   1 mg at 01/05/21 0926  . cloNIDine (CATAPRES) tablet 0.1 mg  0.1 mg Per Tube Q6H PRN Nelle Don, MD   0.1 mg at 01/01/21 1140  . dextrose 5 % and 0.9 % NaCl with KCl 20 mEq/L infusion   Intravenous Continuous Sharen Hones, MD 75 mL/hr at 01/05/21 1217 New Bag at 01/05/21 1217  . enoxaparin (LOVENOX) injection 40 mg  40 mg Subcutaneous Q24H Gwynne Edinger, MD   40 mg at 01/05/21 1221  . feeding supplement (KATE FARMS STANDARD 1.4) liquid 325 mL  325 mL Per Tube QID Flora Lipps, MD   325 mL at 01/04/21 0936  . free water 300 mL  300 mL Per Tube QID Sharen Hones, MD   300 mL at 01/04/21 1313  . guaiFENesin (ROBITUSSIN) 100 MG/5ML solution 200 mg  200 mg Per Tube Q4H PRN Rust-Chester, Britton L, NP   200 mg at 01/05/21 1218  . hydrALAZINE (APRESOLINE) injection 10 mg  10 mg Intravenous Q4H PRN Nelle Don, MD   10 mg at 12/29/20 2148  . insulin aspart (novoLOG) injection 0-15 Units  0-15 Units Subcutaneous Q4H Flora Lipps, MD   2 Units at 01/05/21 0927  . insulin glargine (LANTUS) injection 5 Units  5 Units Subcutaneous Daily Gwynne Edinger, MD   5 Units at 01/05/21 [phone]  . levETIRAcetam (KEPPRA) 100 MG/ML solution 1,000 mg  1,000 mg Per Tube BID Greta Doom, MD   1,000 mg at 01/05/21 2992  . MEDLINE mouth rinse  15 mL Mouth Rinse 10 times per day Flora Lipps, MD   15 mL at 01/05/21 1313  . midazolam (VERSED) injection 2 mg  2 mg Intravenous Q1H PRN Rust-Chester, Britton L, NP   2 mg at 01/01/21 2245  . ondansetron (ZOFRAN) injection 4 mg  4 mg Intravenous Q6H PRN Sharion Settler, NP      . oxyCODONE (Oxy IR/ROXICODONE) immediate release tablet 5 mg  5 mg Per Tube Q6H Bhagat, Srishti L, MD   5 mg at 01/05/21 1357  . pantoprazole sodium (PROTONIX) 40 mg/20 mL oral suspension 40 mg  40 mg Per Tube QHS Flora Lipps, MD   40 mg at 01/05/21 0051  . polyethylene glycol (MIRALAX / GLYCOLAX) packet 17 g  17 g Per Tube Daily Benita Gutter, RPH   17 g at 01/05/21  4268  . QUEtiapine (SEROQUEL) tablet 25 mg  25 mg Per Tube QHS Flora Lipps, MD   25 mg at 01/05/21 0051  . senna-docusate (Senokot-S) tablet 1 tablet  1 tablet Per Tube Daily Benita Gutter, RPH   1 tablet at 01/05/21 3419  . sodium  chloride flush (NS) 0.9 % injection 10-40 mL  10-40 mL Intracatheter Q12H Freddi Starr, MD   10 mL at 01/05/21 0929  . sodium chloride flush (NS) 0.9 % injection 10-40 mL  10-40 mL Intracatheter PRN Freddi Starr, MD   10 mL at 12/26/20 2150     Review of Systems Unable to perform review of system due to her mentation  Physical Exam Blood pressure (!) 150/76, pulse 84, temperature 98.4 F (36.9 C), temperature source Axillary, resp. rate 18, height 5' 0.98" (1.549 m), weight 77.3 kg, SpO2 96 %. CONSTITUTIONAL: NAD EYES: Pupils are equal, round,, Sclera are non-icteric. EARS, NOSE, MOUTH AND THROAT. Hearing is intact to voice. Neck: trach in place. No megalies no hematomas LYMPH NODES:  Lymph nodes in the neck are normal. RESPIRATORY:  Lungs are course bilaterally. There is normal respiratory effort, and without pathologic use of accessory muscles. CARDIOVASCULAR: Heart is regular without murmurs, gallops, or rubs. GI: The abdomen is  soft, nontender, and nondistended. There are no palpable masses. There is no hepatosplenomegaly.  She has a tube in place without evidence of infection.  She also has a lower midline laparotomy scar   GU: Rectal deferred.   MUSCULOSKELETAL: Normal muscle strength and tone. No cyanosis or edema.   SKIN: Turgor is good and there are no pathologic skin lesions or ulcers. NEUROLOGIC: SHe is somnolent but is able to open her eyes on command.  She is able to stick her tongue out and open and closes her eyes on commands.  SHe is unable to move the upper or lower extremities at this time but OT has documented that she did move them before ( non focal).    Data Reviewed  I have personally reviewed the patient's imaging,  laboratory findings and medical records.    Assessment/Plan 55 year old female status post cardiac arrest with some anoxic brain injury and significant neurological sequela.  She had a PEG tube performed by Dr. Haig Prophet and Dr. Lysle Pearl 3-1/2 weeks ago.  Now there is evidence of aspiration from tube feeds. I have had a discussion with the operating surgeons as well as the gastroenterology team.  She is only 3-1/2 weeks out from PEG which is not necessarily ideal to conversion for percutaneous jejunostomy tube.  We can always contemplate exchanging PEG to cutaneous jejunostomy tube with fluoroscopy using existing PEG. I do think that is worth pursuing these options as this will be the least invasive.  Alternatively we can always perform a surgical formal jejunostomy but this obviously will be more involved and more invasive. One of the alternative is to do work-up for gastroparesis to evaluate the emptying of the stomach.  May also do empiric Phenergan.  I will defer this work-up and treatment to Dr. Lysle Pearl I have also discussed with Dr. Lysle Pearl and he wishes to take over care of this patient tomorrow.  He is willing to perform a formal surgical jejunostomy in case interventional radiology is unable to exchange the current PEG. Robotic approach will obviously be a good alternative although this is not necessarily technically easy to perform. Also discussed with the husband and updated him about the situation.  There is no need for any surgical intervention at this time.   Caroleen Hamman, MD FACS General Surgeon 01/05/2021, 2:09 PM

## 2021-01-05 NOTE — Progress Notes (Signed)
PROGRESS NOTE    [name] [name]  XQJ:[id number] DOB: Apr 27, 1966 DOA: 12/02/2020 PCP: Marinda Elk, MD   Chief complaint.  Altered mental status. Brief Narrative:  Patient is a 55 year old female with acute cardiac arrest due to a severe acidosis and DKA. She had a witnessed V. fib arrest, and it shocked in the field. She was subsequently transferred to ICU,she was started on hypothermic protocol. She was a found to have anoxic brain injury. MRI brain was performed on 1/30, there is infarcts in the basal ganglion and white matter. Also has evidence of anoxic brain injury. Patient appear to also have seizures, has been seen by neurology. That have improved, EEG no longer see any seizure activity. Patient had tracheostomy and PEG tube placement on 1/22. [facility]is [facility].    Assessment & Plan:   Active Problems:   Cardiac arrest ([place])   Acute respiratory failure ([facility])   Hypoxic ischemic encephalopathy   Endotracheal tube present   Essential hypertension  #1.  Aspiration pneumonia bilateral lower lobes Acute respiratory failure with hypoxemia and hypercapnia. Status post tracheostomy and PEG tube placement. Patient aspirated yesterday after coughing episode.  Patient could not keep her upper body elevated, she coughed yesterday, feeding material was suctioned out from her tracheal tube.  She developed a low-grade fever today.  Although her chest x-ray did not see any frank infiltrates, he did develop aspiration pneumonia.  Unasyn will be started. Due to concern of patient recurrent aspiration, not able to position the body correctly, I will discussed with general surgery to convert G-tube to a J-tube to prevent any possibility of aspiration. For now, patient will be n.p.o., continue fluids to keep rehydrated.  #2.  Hypernatremia. Condition improved after giving D5 water.  #3.  Acute anoxic brain injury. Cardiac arrest secondary to ventricular  fibrillation. Condition is relatively stable.  Patient is pending for CIR transfer.  #4.  Uncontrolled type 2 diabetes with hyperglycemia Diabetic ketoacidosis. Condition improved.    DVT prophylaxis: Lovenox Code Status: Partial Family Communication: Husband updated Disposition Plan:  .   Status is: Inpatient  Remains inpatient appropriate because:Inpatient level of care appropriate due to severity of illness   Dispo: The patient is from: Home              Anticipated d/c is to: CIR              Anticipated d/c date is: 2 days              Patient currently is not medically stable to d/c.   Difficult to place patient No        No intake/output data recorded. No intake/output data recorded.     Consultants:   General surgery  Procedures: PEG and Trach  Antimicrobials: Unasyn  Subjective: Patient could not keep her body elevated, she had a cough yesterday, she aspirated afterwards.  Significant amount feeding material was suctioned out from tracheal tube.  Patient developed low-grade fever overnight, the max temperature was 100.4.  She was suctioned overnight. She does not have any upper abdominal pain or nausea vomiting. She is stable on 5 L oxygen, no worsening shortness of breath.  Objective: Vitals:   01/04/21 2150 01/04/21 2300 01/05/21 0402 01/05/21 0752  BP: (!) 164/99  129/84 (!) 165/91  Pulse: (!) 109  87 98  Resp: 17  16 18   Temp: 99.8 F (37.7 C)  (!) 100.4 F (38 C) 98.1 F (36.7 C)  TempSrc:  Oral  Oral   SpO2: 98% 96% 98% 99%  Weight:   77.3 kg   Height:       No intake or output data in the 24 hours ending 01/05/21 1047 Filed Weights   01/03/21 0458 01/04/21 0434 01/05/21 0402  Weight: 78 kg 77.9 kg 77.3 kg    Examination:  General exam: Appears calm and comfortable  Respiratory system: Rhonchi in the base. Respiratory effort normal. Cardiovascular system: S1 & S2 heard, RRR. No JVD, murmurs, rubs, gallops or clicks. No pedal  edema. Gastrointestinal system: Abdomen is nondistended, soft and nontender. No organomegaly or masses felt. Normal bowel sounds heard. Central nervous system: Alert and confused. No focal neurological deficits. Extremities: Symmetric 5 x 5 power. Skin: No rashes, lesions or ulcers     Data Reviewed: I have personally reviewed following labs and imaging studies  CBC: Recent Labs  Lab 01/01/21 0458 01/02/21 0935 01/03/21 0428 01/04/21 0421 01/05/21 0440  WBC 6.9 7.8 7.6 7.6 7.4  NEUTROABS 4.1 5.1 5.0 4.7 4.9  HGB 10.4* 10.7* 10.5* 10.2* 10.7*  HCT 34.0* 34.9* 34.2* 34.3* 35.4*  MCV 85.0 85.5 86.1 87.5 86.6  PLT 292 276 258 245 952   Basic Metabolic Panel: Recent Labs  Lab 01/01/21 0458 01/02/21 0935 01/03/21 0428 01/04/21 0421 01/05/21 0440  NA 144 145 147* 149* 143  K 4.2 3.6 4.0 3.8 3.5  CL 102 102 104 106 104  CO2 29 28 29 30 29   GLUCOSE 152* 157* 144* 116* 145*  BUN 14 21* 19 19 15   CREATININE 0.59 0.70 0.57 0.61 0.58  CALCIUM 9.8 9.8 9.6 9.6 9.3  MG 2.0 2.1 2.2 2.2 2.2  PHOS 4.6 4.9* 5.3* 5.2* 5.4*   GFR: Estimated Creatinine Clearance: 75.6 mL/min (by C-G formula based on SCr of 0.58 mg/dL). Liver Function Tests: Recent Labs  Lab 01/01/21 0458 01/02/21 0935 01/03/21 0428 01/04/21 0421 01/05/21 0440  AST 28 24 23 23 27   ALT 22 19 19 16 16   ALKPHOS 84 82 75 75 70  BILITOT 0.3 0.4 0.3 0.3 0.5  PROT 8.2* 8.4* 8.1 7.8 7.7  ALBUMIN 3.1* 3.3* 3.1* 3.0* 3.1*   No results for input(s): LIPASE, AMYLASE in the last 168 hours. No results for input(s): AMMONIA in the last 168 hours. Coagulation Profile: No results for input(s): INR, PROTIME in the last 168 hours. Cardiac Enzymes: No results for input(s): CKTOTAL, CKMB, CKMBINDEX, TROPONINI in the last 168 hours. BNP (last 3 results) No results for input(s): PROBNP in the last 8760 hours. HbA1C: No results for input(s): HGBA1C in the last 72 hours. CBG: Recent Labs  Lab 01/04/21 1644 01/04/21 2044  01/05/21 0049 01/05/21 0400 01/05/21 0752  GLUCAP 128* 153* 154* 133* 138*   Lipid Profile: No results for input(s): CHOL, HDL, LDLCALC, TRIG, CHOLHDL, LDLDIRECT in the last 72 hours. Thyroid Function Tests: No results for input(s): TSH, T4TOTAL, FREET4, T3FREE, THYROIDAB in the last 72 hours. Anemia Panel: No results for input(s): VITAMINB12, FOLATE, FERRITIN, TIBC, IRON, RETICCTPCT in the last 72 hours. Sepsis Labs: No results for input(s): PROCALCITON, LATICACIDVEN in the last 168 hours.  No results found for this or any previous visit (from the past 240 hour(s)).       Radiology Studies: No results found.      Scheduled Meds: . sodium chloride   Intravenous Once  . alteplase  2 mg Intracatheter Once  . amLODipine  10 mg Per Tube Daily  . atorvastatin  80 mg Per  Tube Daily  . chlorhexidine gluconate (MEDLINE KIT)  15 mL Mouth Rinse BID  . Chlorhexidine Gluconate Cloth  6 each Topical Daily  . clonazePAM  1 mg Per Tube BID  . enoxaparin (LOVENOX) injection  40 mg Subcutaneous Q24H  . feeding supplement (KATE FARMS STANDARD 1.4)  325 mL Per Tube QID  . free water  300 mL Per Tube QID  . insulin aspart  0-15 Units Subcutaneous Q4H  . insulin glargine  5 Units Subcutaneous Daily  . levETIRAcetam  1,000 mg Per Tube BID  . mouth rinse  15 mL Mouth Rinse 10 times per day  . oxyCODONE  5 mg Per Tube Q6H  . pantoprazole sodium  40 mg Per Tube QHS  . polyethylene glycol  17 g Per Tube Daily  . QUEtiapine  25 mg Per Tube QHS  . senna-docusate  1 tablet Per Tube Daily  . sodium chloride flush  10-40 mL Intracatheter Q12H   Continuous Infusions: . sodium chloride 250 mL (12/19/20 0449)  . ampicillin-sulbactam (UNASYN) IV 1.5 g (01/05/21 0929)  . dextrose 75 mL/hr at 01/04/21 2033     LOS: 34 days    Time spent: 35 minutes    Sharen Hones, MD [facility]   To contact the attending provider between 7A-7P or the covering provider during after hours 7P-7A,  please log into the web site [email or web address] and access using universal [place] password for that web site. If you do not have the password, please call the hospital operator.  01/05/2021, 10:47 AM

## 2021-01-06 LAB — GLUCOSE, CAPILLARY
Glucose-Capillary: 118 mg/dL — ABNORMAL HIGH (ref 70–99)
Glucose-Capillary: 118 mg/dL — ABNORMAL HIGH (ref 70–99)
Glucose-Capillary: 121 mg/dL — ABNORMAL HIGH (ref 70–99)
Glucose-Capillary: 124 mg/dL — ABNORMAL HIGH (ref 70–99)
Glucose-Capillary: 126 mg/dL — ABNORMAL HIGH (ref 70–99)
Glucose-Capillary: 145 mg/dL — ABNORMAL HIGH (ref 70–99)

## 2021-01-06 LAB — BASIC METABOLIC PANEL
Anion gap: 13 (ref 5–15)
BUN: 9 mg/dL (ref 6–20)
CO2: 27 mmol/L (ref 22–32)
Calcium: 9.3 mg/dL (ref 8.9–10.3)
Chloride: 105 mmol/L (ref 98–111)
Creatinine, Ser: 0.45 mg/dL (ref 0.44–1.00)
GFR, Estimated: >60 mL/min (ref >60)
Glucose, Bld: 126 mg/dL — ABNORMAL HIGH (ref 70–99)
Potassium: 3.3 mmol/L — ABNORMAL LOW (ref 3.5–5.1)
Sodium: 145 mmol/L (ref 135–145)

## 2021-01-06 LAB — CBC WITH DIFFERENTIAL/PLATELET
Abs Immature Granulocytes: 0.02 10*3/uL (ref 0.00–0.07)
Basophils Absolute: 0 10*3/uL (ref 0.0–0.1)
Basophils Relative: 0 %
Eosinophils Absolute: 0.3 10*3/uL (ref 0.0–0.5)
Eosinophils Relative: 5 %
HCT: 35.1 % — ABNORMAL LOW (ref 36.0–46.0)
Hemoglobin: 10.6 g/dL — ABNORMAL LOW (ref 12.0–15.0)
Immature Granulocytes: 0 %
Lymphocytes Relative: 25 %
Lymphs Abs: 1.8 10*3/uL (ref 0.7–4.0)
MCH: 26 pg (ref 26.0–34.0)
MCHC: 30.2 g/dL (ref 30.0–36.0)
MCV: 86 fL (ref 80.0–100.0)
Monocytes Absolute: 0.4 10*3/uL (ref 0.1–1.0)
Monocytes Relative: 6 %
Neutro Abs: 4.6 10*3/uL (ref 1.7–7.7)
Neutrophils Relative %: 64 %
Platelets: 208 10*3/uL (ref 150–400)
RBC: 4.08 MIL/uL (ref 3.87–5.11)
RDW: 19.1 % — ABNORMAL HIGH (ref 11.5–15.5)
WBC: 7.1 10*3/uL (ref 4.0–10.5)
nRBC: 0 % (ref 0.0–0.2)

## 2021-01-06 LAB — MAGNESIUM: Magnesium: 2.1 mg/dL (ref 1.7–2.4)

## 2021-01-06 LAB — PHOSPHORUS: Phosphorus: 3.8 mg/dL (ref 2.5–4.6)

## 2021-01-06 MED ORDER — POTASSIUM CHLORIDE 10 MEQ/100ML IV SOLN
10.0000 meq | INTRAVENOUS | Status: AC
  Administered 2021-01-06 (×2): 10 meq via INTRAVENOUS
  Filled 2021-01-06 (×2): qty 100

## 2021-01-06 NOTE — Progress Notes (Signed)
Inpatient Rehab Admissions Coordinator:   Note plans to possibly convert to j-tube, pending f/u with Dr. Lysle Pearl today.  Will follow for readiness.   Shann Medal, PT, DPT Admissions Coordinator [phone] 01/06/21  11:46 AM

## 2021-01-06 NOTE — Progress Notes (Signed)
Palliative:  Mrs. Schnieders is lying in bed with nursing staff at bedside attending to needs.  She appears acutely/chronically ill and somewhat frail.  She will make and somewhat keep eye contact.  She will slightly nod, but does not try to speak when I talk with her.  I do not believe that she can make her basic needs known.  There is no family at bedside at this time, although nursing staff shares that her husband is frequently at bedside, along with other family members.  Detail conference with bedside nursing staff related to patient condition, needs, goals of care.   Conference with attending, bedside nursing staff, transition of care team related to patient condition, needs, goals of care.  Plan: Continue to treat the treatable but no chest compressions.   CIR for rehab. At this point PMT to shadow for declines.  The [place] family's goals are set for continued treatment, CIR for rehab.  15 minutes Quinn Axe, NP Palliative medicine team Team phone [phone] Greater than 50% of this time was spent counseling and coordinating care related to the above assessment and plan.

## 2021-01-06 NOTE — Progress Notes (Signed)
PROGRESS NOTE    [name] [name]  RJJ:[id number] DOB: 23-Dec-1965 DOA: 12/02/2020 PCP: Marinda Elk, MD   Chief complaint shortness of breath. Brief Narrative:  Patient is a 55 year old female with acute cardiac arrest due to a severe acidosis and DKA. She had a witnessed V. fib arrest, and it shocked in the field. She was subsequently transferred to ICU,she was started on hypothermic protocol. She was a found to have anoxic brain injury. MRI brain was performed on 1/30, there is infarcts in the basal ganglion and white matter. Also has evidence of anoxic brain injury. Patient appear to also have seizures, has been seen by neurology. That have improved, EEG no longer see any seizure activity. Patient had tracheostomy and PEG tube placement on 1/22. Patientis transferred to [facility] 2/7.   Assessment & Plan:   Active Problems:   Cardiac arrest ([place])   Acute respiratory failure (HCC)   Hypoxic ischemic encephalopathy   Endotracheal tube present   Essential hypertension   Aspiration pneumonia of both lower lobes due to gastric secretions ([place])  #1.  Aspiration pneumonia bilateral lower lobes Acute respiratory failure with hypoxemia and hypercapnia. Status post tracheostomy and PEG tube placement.  Appreciate general surgery consult, still pending for J-tube conversion.  Will keep patient n.p.o., continue IV fluids. Continue antibiotics with IV Unasyn until patient has J tube placed. Patient oxygenation still stable.  2.  Hyponatremia. Condition improved.  3.  Acute anoxic brain injury. Cardio arrest secondary to ventricular fibrillation. Patient pending transfer to CIR.  4. Uncontrolled type 2 diabetes with hyperglycemia. Diabetic ketoacidosis Condition improved.    DVT prophylaxis: Lovenox Code Status: Partial Family Communication:  Disposition Plan:  .   Status is: Inpatient  Remains inpatient appropriate because:Inpatient level of care appropriate  due to severity of illness   Dispo: The patient is from: Home              Anticipated d/c is to: CIR              Anticipated d/c date is: 2 days              Patient currently is not medically stable to d/c.   Difficult to place patient No        I/O last 3 completed shifts: In: 628.8 [I.V.:428.8; IV Piggyback:200] Out: -  No intake/output data recorded.     Consultants:   General surgery  Procedures: Trach/Peg  Antimicrobials: Unasyn  Subjective: Patient did not have a fever today.  No chills She is stable on her oxygen, no worsening short of breath today. No abdominal pain or nausea vomiting.   Objective: Vitals:   01/06/21 0300 01/06/21 0520 01/06/21 0757 01/06/21 0820  BP:  123/73 135/89   Pulse:  85 95   Resp:   18   Temp:  98.1 F (36.7 C) 99.2 F (37.3 C)   TempSrc:  Oral Oral   SpO2:  99% 100% 97%  Weight: 80.8 kg     Height:        Intake/Output Summary (Last 24 hours) at 01/06/2021 1419 Last data filed at 01/05/2021 1800 Gross per 24 hour  Intake 628.75 ml  Output --  Net 628.75 ml   Filed Weights   01/04/21 0434 01/05/21 0402 01/06/21 0300  Weight: 77.9 kg 77.3 kg 80.8 kg    Examination:  General exam: Appears calm and comfortable  Respiratory system: Clear to auscultation. Respiratory effort normal. Cardiovascular system: S1 & S2 heard, RRR.  No JVD, murmurs, rubs, gallops or clicks. No pedal edema. Gastrointestinal system: Abdomen is nondistended, soft and nontender. No organomegaly or masses felt. Normal bowel sounds heard. Central nervous system: Alert and confused. No focal neurological deficits. Extremities: Symmetric 5 x 5 power. Skin: No rashes, lesions or ulcers      Data Reviewed: I have personally reviewed following labs and imaging studies  CBC: Recent Labs  Lab 01/02/21 0935 01/03/21 0428 01/04/21 0421 01/05/21 0440 01/06/21 0404  WBC 7.8 7.6 7.6 7.4 7.1  NEUTROABS 5.1 5.0 4.7 4.9 4.6  HGB 10.7* 10.5* 10.2*  10.7* 10.6*  HCT 34.9* 34.2* 34.3* 35.4* 35.1*  MCV 85.5 86.1 87.5 86.6 86.0  PLT 276 258 245 210 712   Basic Metabolic Panel: Recent Labs  Lab 01/02/21 0935 01/03/21 0428 01/04/21 0421 01/05/21 0440 01/06/21 0404  NA 145 147* 149* 143 145  K 3.6 4.0 3.8 3.5 3.3*  CL 102 104 106 104 105  CO2 28 29 30 29 27   GLUCOSE 157* 144* 116* 145* 126*  BUN 21* 19 19 15 9   CREATININE 0.70 0.57 0.61 0.58 0.45  CALCIUM 9.8 9.6 9.6 9.3 9.3  MG 2.1 2.2 2.2 2.2 2.1  PHOS 4.9* 5.3* 5.2* 5.4* 3.8   GFR: Estimated Creatinine Clearance: 77.4 mL/min (by C-G formula based on SCr of 0.45 mg/dL). Liver Function Tests: Recent Labs  Lab 01/01/21 0458 01/02/21 0935 01/03/21 0428 01/04/21 0421 01/05/21 0440  AST 28 24 23 23 27   ALT 22 19 19 16 16   ALKPHOS 84 82 75 75 70  BILITOT 0.3 0.4 0.3 0.3 0.5  PROT 8.2* 8.4* 8.1 7.8 7.7  ALBUMIN 3.1* 3.3* 3.1* 3.0* 3.1*   No results for input(s): LIPASE, AMYLASE in the last 168 hours. No results for input(s): AMMONIA in the last 168 hours. Coagulation Profile: No results for input(s): INR, PROTIME in the last 168 hours. Cardiac Enzymes: No results for input(s): CKTOTAL, CKMB, CKMBINDEX, TROPONINI in the last 168 hours. BNP (last 3 results) No results for input(s): PROBNP in the last 8760 hours. HbA1C: No results for input(s): HGBA1C in the last 72 hours. CBG: Recent Labs  Lab 01/05/21 1958 01/06/21 0003 01/06/21 0404 01/06/21 0755 01/06/21 1146  GLUCAP 118* 145* 124* 121* 118*   Lipid Profile: No results for input(s): CHOL, HDL, LDLCALC, TRIG, CHOLHDL, LDLDIRECT in the last 72 hours. Thyroid Function Tests: No results for input(s): TSH, T4TOTAL, FREET4, T3FREE, THYROIDAB in the last 72 hours. Anemia Panel: No results for input(s): VITAMINB12, FOLATE, FERRITIN, TIBC, IRON, RETICCTPCT in the last 72 hours. Sepsis Labs: No results for input(s): PROCALCITON, LATICACIDVEN in the last 168 hours.  No results found for this or any previous visit  (from the past 240 hour(s)).       Radiology Studies: DG Chest 1 View  Result Date: 01/05/2021 CLINICAL DATA:  Recent cardiac arrest with tracheostomy in place EXAM: CHEST  1 VIEW COMPARISON:  December 20, 2020 FINDINGS: Tracheostomy catheter tip is 4.5 cm above the carina. No pneumothorax. There is airspace opacity in portions of the left mid and lower lung region. Lungs elsewhere clear. Heart is upper normal in size with pulmonary vascularity normal. No adenopathy. No bone lesions. IMPRESSION: Ill-defined opacity in portions of the left mid and lower lung regions. Differential considerations include pneumonia and aspiration for this appearance. Lungs elsewhere clear. Heart upper normal in size. Tracheostomy present. No pneumothorax. Electronically Signed   By: Lowella Grip III M.D.   On: 01/05/2021 12:06  Scheduled Meds: . sodium chloride   Intravenous Once  . alteplase  2 mg Intracatheter Once  . amLODipine  10 mg Per Tube Daily  . atorvastatin  80 mg Per Tube Daily  . chlorhexidine gluconate (MEDLINE KIT)  15 mL Mouth Rinse BID  . Chlorhexidine Gluconate Cloth  6 each Topical Daily  . clonazePAM  1 mg Per Tube BID  . insulin aspart  0-15 Units Subcutaneous Q4H  . insulin glargine  5 Units Subcutaneous Daily  . levETIRAcetam  1,000 mg Per Tube BID  . mouth rinse  15 mL Mouth Rinse 10 times per day  . oxyCODONE  5 mg Per Tube Q6H  . pantoprazole sodium  40 mg Per Tube QHS  . polyethylene glycol  17 g Per Tube Daily  . QUEtiapine  25 mg Per Tube QHS  . senna-docusate  1 tablet Per Tube Daily  . sodium chloride flush  10-40 mL Intracatheter Q12H   Continuous Infusions: . sodium chloride 250 mL (12/19/20 0449)  . ampicillin-sulbactam (UNASYN) IV 1.5 g (01/06/21 0937)  . dextrose 5 % and 0.9 % NaCl with KCl 20 mEq/L 75 mL/hr at 01/06/21 0107     LOS: 35 days    Time spent: 27 minutes    Sharen Hones, MD [facility]   To contact the attending provider  between 7A-7P or the covering provider during after hours 7P-7A, please log into the web site [email or web address] and access using universal [place] password for that web site. If you do not have the password, please call the hospital operator.  01/06/2021, 2:19 PM

## 2021-01-07 LAB — CBC WITH DIFFERENTIAL/PLATELET
Abs Immature Granulocytes: 0.02 10*3/uL (ref 0.00–0.07)
Basophils Absolute: 0 10*3/uL (ref 0.0–0.1)
Basophils Relative: 0 %
Eosinophils Absolute: 0.2 10*3/uL (ref 0.0–0.5)
Eosinophils Relative: 4 %
HCT: 34.8 % — ABNORMAL LOW (ref 36.0–46.0)
Hemoglobin: 10.8 g/dL — ABNORMAL LOW (ref 12.0–15.0)
Immature Granulocytes: 0 %
Lymphocytes Relative: 29 %
Lymphs Abs: 1.8 10*3/uL (ref 0.7–4.0)
MCH: 26.6 pg (ref 26.0–34.0)
MCHC: 31 g/dL (ref 30.0–36.0)
MCV: 85.7 fL (ref 80.0–100.0)
Monocytes Absolute: 0.4 10*3/uL (ref 0.1–1.0)
Monocytes Relative: 7 %
Neutro Abs: 3.6 10*3/uL (ref 1.7–7.7)
Neutrophils Relative %: 60 %
Platelets: 208 10*3/uL (ref 150–400)
RBC: 4.06 MIL/uL (ref 3.87–5.11)
RDW: 19.2 % — ABNORMAL HIGH (ref 11.5–15.5)
WBC: 6.1 10*3/uL (ref 4.0–10.5)
nRBC: 0 % (ref 0.0–0.2)

## 2021-01-07 LAB — GLUCOSE, CAPILLARY
Glucose-Capillary: 143 mg/dL — ABNORMAL HIGH (ref 70–99)
Glucose-Capillary: 158 mg/dL — ABNORMAL HIGH (ref 70–99)
Glucose-Capillary: 149 mg/dL — ABNORMAL HIGH (ref 70–99)
Glucose-Capillary: 129 mg/dL — ABNORMAL HIGH (ref 70–99)
Glucose-Capillary: 169 mg/dL — ABNORMAL HIGH (ref 70–99)

## 2021-01-07 LAB — MAGNESIUM: Magnesium: 1.8 mg/dL (ref 1.7–2.4)

## 2021-01-07 LAB — BASIC METABOLIC PANEL
Anion gap: 10 (ref 5–15)
BUN: 7 mg/dL (ref 6–20)
CO2: 29 mmol/L (ref 22–32)
Calcium: 9.3 mg/dL (ref 8.9–10.3)
Chloride: 107 mmol/L (ref 98–111)
Creatinine, Ser: 0.66 mg/dL (ref 0.44–1.00)
GFR, Estimated: >60 mL/min (ref >60)
Glucose, Bld: 126 mg/dL — ABNORMAL HIGH (ref 70–99)
Potassium: 2.8 mmol/L — ABNORMAL LOW (ref 3.5–5.1)
Sodium: 146 mmol/L — ABNORMAL HIGH (ref 135–145)

## 2021-01-07 LAB — PHOSPHORUS: Phosphorus: 4.1 mg/dL (ref 2.5–4.6)

## 2021-01-07 MED ORDER — OSMOLITE 1.5 CAL PO LIQD
1000.0000 mL | ORAL | Status: DC
  Administered 2021-01-07 – 2021-01-09 (×3): 1000 mL

## 2021-01-07 MED ORDER — PROSOURCE TF PO LIQD
45.0000 mL | Freq: Two times a day (BID) | ORAL | Status: DC
  Administered 2021-01-08 – 2021-01-10 (×5): 45 mL
  Filled 2021-01-07 (×6): qty 45

## 2021-01-07 MED ORDER — ADULT MULTIVITAMIN LIQUID CH
15.0000 mL | Freq: Every day | ORAL | Status: DC
  Administered 2021-01-08 – 2021-01-09 (×2): 15 mL
  Filled 2021-01-07 (×3): qty 15

## 2021-01-07 MED ORDER — FREE WATER
30.0000 mL | Status: DC
  Administered 2021-01-07 – 2021-01-10 (×19): 30 mL

## 2021-01-07 MED ORDER — DEXTROSE 5 % IV SOLN: INTRAVENOUS | Status: DC

## 2021-01-07 NOTE — Progress Notes (Signed)
Subjective:  CC: [name] [name] is a 55 y.o. female  Hospital stay day 36,   HPI: No acute issues overnight, no further reports of aspiration.  ROS:  Unable to obtain secondary to patient status  Objective:   Temp:  [98.6 F (37 C)-99.4 F (37.4 C)] 99.4 F (37.4 C) (02/15 0833) Pulse Rate:  [98-105] 105 (02/15 0833) Resp:  [16-22] 18 (02/15 0833) BP: (148-163)/(80-91) 159/91 (02/15 0833) SpO2:  [98 %-100 %] 98 % (02/15 0833) FiO2 (%):  [28 %] 28 % (02/15 0833) Weight:  [74.3 kg] 74.3 kg (02/15 0405)     Height: 5' 0.98" (154.9 cm) Weight: 74.3 kg BMI (Calculated): 30.97   Intake/Output this shift:   Intake/Output Summary (Last 24 hours) at 01/07/2021 1018 Last data filed at 01/06/2021 1600 Gross per 24 hour  Intake 400 ml  Output --  Net 400 ml    Constitutional :  appears stated age and no distress  Respiratory:  clear to auscultation bilaterally. Lurline Idol present  Cardiovascular:  regular rate and rhythm  Gastrointestinal: soft, non-tender; bowel sounds normal; no masses,  no organomegaly. PEG in place,   Skin: Cool and moist.   Psychiatric: Normal affect, non-agitated, not confused       LABS:  CMP Latest Ref Rng & Units 01/07/2021 01/06/2021 01/05/2021  Glucose 70 - 99 mg/dL 126(H) 126(H) 145(H)  BUN 6 - 20 mg/dL 7 9 15   Creatinine 0.44 - 1.00 mg/dL 0.66 0.45 0.58  Sodium 135 - 145 mmol/L 146(H) 145 143  Potassium 3.5 - 5.1 mmol/L 2.8(L) 3.3(L) 3.5  Chloride 98 - 111 mmol/L 107 105 104  CO2 22 - 32 mmol/L 29 27 29   Calcium 8.9 - 10.3 mg/dL 9.3 9.3 9.3  Total Protein 6.5 - 8.1 g/dL - - 7.7  Total Bilirubin 0.3 - 1.2 mg/dL - - 0.5  Alkaline Phos 38 - 126 U/L - - 70  AST 15 - 41 U/L - - 27  ALT 0 - 44 U/L - - 16   CBC Latest Ref Rng & Units 01/07/2021 01/06/2021 01/05/2021  WBC 4.0 - 10.5 K/uL 6.1 7.1 7.4  Hemoglobin 12.0 - 15.0 g/dL 10.8(L) 10.6(L) 10.7(L)  Hematocrit 36.0 - 46.0 % 34.8(L) 35.1(L) 35.4(L)  Platelets 150 - 400 K/uL 208 208 210     RADS: n/a Assessment:   S/p PEG placement.  Recent reports of aspiration and concern for persistent issues.  Stable today and no further reported episodes after stopping tube feeds for a day.  Case discussed with IR and they recommended waiting for PEG tube site to heal more prior to attempting exchange for GJ.  Proceeding with surgery just to start jejunal feeds seems excessive in my opinion.  Pt was tolerating slow continuous feeds prior, so recommend trying this until tube can be exchanged by IR.  Plan discussed with primary and he is in agreement.

## 2021-01-07 NOTE — Progress Notes (Signed)
Inpatient Rehab Admissions Coordinator:   Appears that plans for G>J tube are no longer imminent.  I have no beds available for this patient to admit to CIR today.  Will continue to follow for timing of potential admission pending bed availability.   Shann Medal, PT, DPT Admissions Coordinator [phone] 01/07/21  11:18 AM

## 2021-01-07 NOTE — Progress Notes (Signed)
PROGRESS NOTE    [name] [name]  NAT:[id number] DOB: [birth year] DOA: 12/02/2020 PCP: Marinda Elk, MD   CC: shortness of breath Brief Narrative:  Patient is a 55 year old female with acute cardiac arrest due to a severe acidosis and DKA. She had a witnessed V. fib arrest, and it shocked in the field. She was subsequently transferred to ICU,she was started on hypothermic protocol. She was a found to have anoxic brain injury. MRI brain was performed on 1/30, there is infarcts in the basal ganglion and white matter. Also has evidence of anoxic brain injury. Patient appear to also have seizures, has been seen by neurology. That have improved, EEG no longer see any seizure activity. Patient had tracheostomy and PEG tube placement on 1/22. [facility]is [facility] 2[facility]7. Patient developed aspiration pneumonia on 2/13.  Patient not able to elevate the upper body, she has feeding formula suctioned out from trach every time patient coughs or she has increased intra-abdominal pressure.  Discussed with general surgery for converting G-tube to a J-tube, considered too soon to perform.   Assessment & Plan:   Active Problems:   Cardiac arrest ([place])   Acute respiratory failure (HCC)   Hypoxic ischemic encephalopathy   Endotracheal tube present   Essential hypertension   Aspiration pneumonia of both lower lobes due to gastric secretions ([place])  #1. Acute respiratory failure with hypoxemia and hypercapnia.  Secondary to cardiac arrest. Acute aspiration pneumonia bilateral lower lobes. Status post tracheostomy and PEG tube placement.   Patient could not elevate the upper body to avoid aspiration.  She aspirates when she coughs or when she has a bowel movement.  Discussed with general surgery, it is too soon to convert a G-tube into J-tube.  As a result, decision was made to start tube feeding at a slow rate.  We will give IV fluids for free water. Patient is also placed on Unasyn for  aspiration pneumonia when she developed fever after aspiration.  Continue for additional 3 days.   #2.  Hypernatremia. Continue D5 water as  free water while receiving tube feeding.  3.  Acute anoxic brain injury. Cardiac arrest secondary to ventricular fibrillation. Condition improving, but the patient brain injury has not improved.  4.  Uncontrolled type 2 diabetes with hyperglycemia Diabetic ketoacidosis. Condition had improved.  Continue lower dose Lantus, glucose is better.  Continue sliding scale insulin.     DVT prophylaxis: Lovenox Code Status: Partial Family Communication: husband updated Disposition Plan:  .   Status is: Inpatient  Remains inpatient appropriate because:Inpatient level of care appropriate due to severity of illness   Dispo:  Patient From: Home  Planned Disposition: Inpatient Rehab  Expected discharge date: 01/10/2021  Medically stable for discharge: No          I/O last 3 completed shifts: In: 400 [IV Piggyback:400] Out: -  No intake/output data recorded.     Consultants:   General surgery  Procedures: PEG/Trach  Antimicrobials: Unasyn  Subjective: Patient is still confused, but not have any evidence of shortness of breath. No respiratory distress. She does not seem to be in pain, no nausea vomiting. She is currently n.p.o..  Objective: Vitals:   01/06/21 1631 01/07/21 0405 01/07/21 0833 01/07/21 1134  BP: (!) 148/86 (!) 163/80 (!) 159/91 120/85  Pulse: 98 100 (!) 105 75  Resp:  16 18 19   Temp: 99 F (37.2 C) 98.6 F (37 C) 99.4 F (37.4 C) 98.2 F (36.8 C)  TempSrc:  Oral  Oral Oral  SpO2: 99% 100% 98% 100%  Weight:  74.3 kg    Height:        Intake/Output Summary (Last 24 hours) at 01/07/2021 1329 Last data filed at 01/07/2021 1133 Gross per 24 hour  Intake 400 ml  Output 0 ml  Net 400 ml   Filed Weights   01/05/21 0402 01/06/21 0300 01/07/21 0405  Weight: 77.3 kg 80.8 kg 74.3 kg    Examination:  General  exam: Appears calm and comfortable  Respiratory system: Clear to auscultation. Respiratory effort normal. Cardiovascular system: S1 & S2 heard, RRR. No JVD, murmurs, rubs, gallops or clicks. No pedal edema. Gastrointestinal system: Abdomen is nondistended, soft and nontender. No organomegaly or masses felt. Normal bowel sounds heard. Central nervous system: Alert and nonverbal. No focal neurological deficits. Extremities: Symmetric 5 x 5 power. Skin: No rashes, lesions or ulcers     Data Reviewed: I have personally reviewed following labs and imaging studies  CBC: Recent Labs  Lab 01/03/21 0428 01/04/21 0421 01/05/21 0440 01/06/21 0404 01/07/21 0458  WBC 7.6 7.6 7.4 7.1 6.1  NEUTROABS 5.0 4.7 4.9 4.6 3.6  HGB 10.5* 10.2* 10.7* 10.6* 10.8*  HCT 34.2* 34.3* 35.4* 35.1* 34.8*  MCV 86.1 87.5 86.6 86.0 85.7  PLT 258 245 210 208 326   Basic Metabolic Panel: Recent Labs  Lab 01/03/21 0428 01/04/21 0421 01/05/21 0440 01/06/21 0404 01/07/21 0458  NA 147* 149* 143 145 146*  K 4.0 3.8 3.5 3.3* 2.8*  CL 104 106 104 105 107  CO2 29 30 29 27 29   GLUCOSE 144* 116* 145* 126* 126*  BUN 19 19 15 9 7   CREATININE 0.57 0.61 0.58 0.45 0.66  CALCIUM 9.6 9.6 9.3 9.3 9.3  MG 2.2 2.2 2.2 2.1 1.8  PHOS 5.3* 5.2* 5.4* 3.8 4.1   GFR: Estimated Creatinine Clearance: 74.1 mL/min (by C-G formula based on SCr of 0.66 mg/dL). Liver Function Tests: Recent Labs  Lab 01/01/21 0458 01/02/21 0935 01/03/21 0428 01/04/21 0421 01/05/21 0440  AST 28 24 23 23 27   ALT 22 19 19 16 16   ALKPHOS 84 82 75 75 70  BILITOT 0.3 0.4 0.3 0.3 0.5  PROT 8.2* 8.4* 8.1 7.8 7.7  ALBUMIN 3.1* 3.3* 3.1* 3.0* 3.1*   No results for input(s): LIPASE, AMYLASE in the last 168 hours. No results for input(s): AMMONIA in the last 168 hours. Coagulation Profile: No results for input(s): INR, PROTIME in the last 168 hours. Cardiac Enzymes: No results for input(s): CKTOTAL, CKMB, CKMBINDEX, TROPONINI in the last 168  hours. BNP (last 3 results) No results for input(s): PROBNP in the last 8760 hours. HbA1C: No results for input(s): HGBA1C in the last 72 hours. CBG: Recent Labs  Lab 01/06/21 1146 01/06/21 1748 01/06/21 2340 01/07/21 0401 01/07/21 0831  GLUCAP 118* 126* 118* 143* 158*   Lipid Profile: No results for input(s): CHOL, HDL, LDLCALC, TRIG, CHOLHDL, LDLDIRECT in the last 72 hours. Thyroid Function Tests: No results for input(s): TSH, T4TOTAL, FREET4, T3FREE, THYROIDAB in the last 72 hours. Anemia Panel: No results for input(s): VITAMINB12, FOLATE, FERRITIN, TIBC, IRON, RETICCTPCT in the last 72 hours. Sepsis Labs: No results for input(s): PROCALCITON, LATICACIDVEN in the last 168 hours.  No results found for this or any previous visit (from the past 240 hour(s)).       Radiology Studies: No results found.      Scheduled Meds: . sodium chloride   Intravenous Once  . alteplase  2 mg  Intracatheter Once  . amLODipine  10 mg Per Tube Daily  . atorvastatin  80 mg Per Tube Daily  . chlorhexidine gluconate (MEDLINE KIT)  15 mL Mouth Rinse BID  . Chlorhexidine Gluconate Cloth  6 each Topical Daily  . clonazePAM  1 mg Per Tube BID  . feeding supplement (OSMOLITE 1.5 CAL)  1,000 mL Per Tube Q24H  . [START ON 01/08/2021] feeding supplement (PROSource TF)  45 mL Per Tube BID  . free water  30 mL Per Tube Q4H  . insulin aspart  0-15 Units Subcutaneous Q4H  . insulin glargine  5 Units Subcutaneous Daily  . levETIRAcetam  1,000 mg Per Tube BID  . mouth rinse  15 mL Mouth Rinse 10 times per day  . [START ON 01/08/2021] multivitamin  15 mL Per Tube Daily  . oxyCODONE  5 mg Per Tube Q6H  . pantoprazole sodium  40 mg Per Tube QHS  . polyethylene glycol  17 g Per Tube Daily  . QUEtiapine  25 mg Per Tube QHS  . senna-docusate  1 tablet Per Tube Daily  . sodium chloride flush  10-40 mL Intracatheter Q12H   Continuous Infusions: . sodium chloride 250 mL (12/19/20 0449)  .  ampicillin-sulbactam (UNASYN) IV 1.5 g (01/07/21 0914)  . dextrose 5 % and 0.9 % NaCl with KCl 20 mEq/L 75 mL/hr at 01/06/21 1448     LOS: 36 days    Time spent: 28 minutes    Sharen Hones, MD [facility]   To contact the attending provider between 7A-7P or the covering provider during after hours 7P-7A, please log into the web site [email or web address] and access using universal [place] password for that web site. If you do not have the password, please call the hospital operator.  01/07/2021, 1:29 PM

## 2021-01-07 NOTE — Progress Notes (Signed)
Occupational Therapy Treatment Patient Details Name: [name] [name] MRN: [id number] DOB: Dec 17, 1965 Today's Date: 01/07/2021    History of present illness Pt is a 55 year old female presented to the ED via EMS post witnessed cardiac arrest. ROSC achieved following 3 defibrillations and one round of epinephrine. Patient found to have significant hypokalemia on arrival. Given witnessed arrest, patient Code ICE.  S/p trach and peg tube placement.  Workup showed anoxic brain injury. Pt also showed facial twitching that could be myoclonic versus partial seizure.   OT comments  Pt transitioning easily from OT session with no signs or symptoms of distress. RN present giving medication as well. Pt had been incontinent of bladder before transferring into chair. OT assisted pt with hygiene and pt was able to initiate pushing L UE through hospital gown with increased time and mod multimodal cuing. OT placing wash cloth in pt's L hand and bring to pt's face with pt then initiating washing face with increased time to complete task. Pt nods head yes/no to questions. OT removed R wrist brace to check skin integrity and for PROM x5 reps in all planes of movement with cuing for pt to attend to/look at R UE. OT positioning pt in chair for comfort and safety. Safety belt chair alarm donned prior to exiting the room. Pt continues to benefit from OT intervention and would benefit from intensive inpatient rehab program to continue to address functional deficits.   Follow Up Recommendations  CIR    Equipment Recommendations  Other (comment) (defer to next venue of care)       Precautions / Restrictions Precautions Precautions: Fall Precaution Comments: trach collar 5 L Restrictions Weight Bearing Restrictions: No       Mobility Bed Mobility Overal bed mobility: Needs Assistance Bed Mobility: Supine to Sit     Supine to sit: HOB elevated;Max assist        Transfers Overall transfer level: Needs  assistance Equipment used: Sliding board Transfers: Lateral/Scoot Transfers Sit to Stand: Max assist;+2 physical assistance;+2 safety/equipment        Lateral/Scoot Transfers: Max assist;+2 safety/equipment;With slide board;+2 physical assistance      Balance Overall balance assessment: Needs assistance Sitting-balance support: Feet supported Sitting balance-Leahy Scale: Fair                                     ADL either performed or assessed with clinical judgement   ADL Overall ADL's : Needs assistance/impaired     Grooming: Wash/dry face;Sitting;Minimal assistance Grooming Details (indicate cue type and reason): min A to initiate and then pt performs         Upper Body Dressing : Maximal assistance Upper Body Dressing Details (indicate cue type and reason): changing gown- pushed L UE through sleeve                         Vision Patient Visual Report: No change from baseline            Cognition Arousal/Alertness: Awake/alert Behavior During Therapy: Flat affect Overall Cognitive Status: Difficult to assess                                 General Comments: non verbal, but improved visual tracking noted, able to shake head yes/no, and use of L UE with self care  tasks with assistance to initiate                   Pertinent Vitals/ Pain       Pain Assessment: Faces Faces Pain Scale: No hurt         Frequency  Min 3X/week        Progress Toward Goals  OT Goals(current goals can now be found in the care plan section)  Progress towards OT goals: Progressing toward goals  Acute Rehab OT Goals Patient Stated Goal: none stated Time For Goal Achievement: 01/09/21 Potential to Achieve Goals: [place] Discharge plan remains appropriate       AM-PAC OT "6 Clicks" Daily Activity     Outcome Measure   Help from another person eating meals?: Total Help from another person taking care of personal grooming?:  A Lot Help from another person toileting, which includes using toliet, bedpan, or urinal?: Total Help from another person bathing (including washing, rinsing, drying)?: A Lot Help from another person to put on and taking off regular upper body clothing?: Total Help from another person to put on and taking off regular lower body clothing?: Total 6 Click Score: 8    End of Session Equipment Utilized During Treatment: Oxygen (trach 5 Ls)  OT Visit Diagnosis: Unsteadiness on feet (R26.81);Muscle weakness (generalized) (M62.81)   Activity Tolerance Patient tolerated treatment well   Patient Left with call bell/phone within reach;in chair;with chair alarm set;Other (comment)   Nurse Communication Mobility status;Precautions (transfer)        Time: 3419-3790 OT Time Calculation (min): 25 min  Charges: OT General Charges $OT Visit: 1 Visit OT Treatments $Neuromuscular Re-education: 23-37 mins  Darleen Crocker, MS, OTR/L , CBIS ascom [phone]  01/07/21, 4:20 PM

## 2021-01-07 NOTE — Progress Notes (Signed)
Physical Therapy Treatment Patient Details Name: [name] [name] MRN: [id number] DOB: 1966-06-11 Today's Date: 01/07/2021    History of Present Illness Pt is a 55 year old female presented to the ED via EMS post witnessed cardiac arrest. ROSC achieved following 3 defibrillations and one round of epinephrine. Patient found to have significant hypokalemia on arrival. Given witnessed arrest, patient Code ICE.  S/p trach and peg tube placement.  Workup showed anoxic brain injury. Pt also showed facial twitching that could be myoclonic versus partial seizure.    PT Comments    Patient alert, scooted down in bed. Able to track PT in room, shake head yes/no with extended time and simple questions. Pt demonstrated good improvement towards goals this session. Supine to sit maxA (pt able to move LEs in prep for transfer). Improved sitting balance compared to last session immediately noted. Primarily CGA throughout for sitting, occasional minA due to R lean. Pt able to smile, nod at therapist, and reached LUE towards chin this session without prompting. Able to assist with repositioning LEs at EOB this session as well. OT in room to piggyback session to assist with mobility; slide board transfer to chair maxAx2, pt able to maintain seated balance and shift weight anteriorly with tactile cueing to maximize pt participation in transfer. Pt up in chair with OT at bedside at end of session. The patient would benefit from further skilled PT intervention to continue to progress towards goals. Recommendation remains appropriate to maximize pt outcomes, function, and safety.    Follow Up Recommendations  CIR     Equipment Recommendations  Other (comment) (TBD at next level of care)    Recommendations for Other Services OT consult;Rehab consult     Precautions / Restrictions Precautions Precautions: Fall Precaution Comments: trach collar 5 L Restrictions Weight Bearing Restrictions: No    Mobility  Bed  Mobility Overal bed mobility: Needs Assistance Bed Mobility: Supine to Sit     Supine to sit: HOB elevated;Max assist          Transfers Overall transfer level: Needs assistance Equipment used: Sliding board Transfers: Lateral/Scoot Transfers          Lateral/Scoot Transfers: Max assist;+2 safety/equipment;With slide board;+2 physical assistance    Ambulation/Gait                 Stairs             Wheelchair Mobility    Modified Rankin (Stroke Patients Only)       Balance Overall balance assessment: Needs assistance Sitting-balance support: Feet supported Sitting balance-Leahy Scale: Fair Sitting balance - Comments: pt CGA-minA for sitting balance, R lateral lean noted. Pt able to sit EOB for 15 minutes prior to transferring to recliner with slideboard                                    Cognition Arousal/Alertness: Awake/alert Behavior During Therapy: Flat affect Overall Cognitive Status: Difficult to assess                                 General Comments: non verbal, but improved visual tracking noted, able to shake head yes/no, wiggle toes and feet on command      Exercises Other Exercises Other Exercises: Patient able to wiggle toes, and perform x10 ankle pumps AAROM once up in chair Other Exercises:  Pt able to move LEs on command to assist with repositioning in sitting at EOB. Pt able to correct midilne with verbal cueing. Pt also reached with LUE to wipe her chin while sitting. maintained sitting for at least 10 minutes prior to slide board transfer.    General Comments        Pertinent Vitals/Pain Pain Assessment: Faces Faces Pain Scale: No hurt    Home Living                      Prior Function            PT Goals (current goals can now be found in the care plan section) Progress towards PT goals: Progressing toward goals;PT to reassess next treatment    Frequency    Min  2X/week      PT Plan Current plan remains appropriate    Co-evaluation PT/OT/SLP Co-Evaluation/Treatment: Yes Reason for Co-Treatment: Complexity of the patient's impairments (multi-system involvement);Necessary to address cognition/behavior during functional activity;For patient/therapist safety PT goals addressed during session: Mobility/safety with mobility;Balance;Proper use of DME OT goals addressed during session: ADL's and self-care;Proper use of Adaptive equipment and DME;Strengthening/ROM      AM-PAC PT "6 Clicks" Mobility   Outcome Measure  Help needed turning from your back to your side while in a flat bed without using bedrails?: Total   Help needed moving to and from a bed to a chair (including a wheelchair)?: Total Help needed standing up from a chair using your arms (e.g., wheelchair or bedside chair)?: Total Help needed to walk in hospital room?: Total Help needed climbing 3-5 steps with a railing? : Total 6 Click Score: 5    End of Session Equipment Utilized During Treatment: Oxygen (trach collar 5L) Activity Tolerance: Patient tolerated treatment well Patient left: in chair;Other (comment) (with OT at bedside) Nurse Communication: Mobility status PT Visit Diagnosis: Other abnormalities of gait and mobility (R26.89);Muscle weakness (generalized) (M62.81);Difficulty in walking, not elsewhere classified (R26.2);Other symptoms and signs involving the nervous system (M46.803)     Time: 2122-4825 PT Time Calculation (min) (ACUTE ONLY): 23 min  Charges:  $Therapeutic Activity: 23-37 mins                     Lieutenant Diego PT, DPT 9:50 AM,01/07/21

## 2021-01-07 NOTE — Progress Notes (Signed)
Nutrition Follow-up  DOCUMENTATION CODES:   Obesity unspecified  INTERVENTION:   Osmolite 1.5 @50ml /hr- Initiate at 13ml/hr and increase by 59ml/hr q 12 hours until goal rate is reached.   Pro-Source 64ml BID via tube, provides 40kcal and 11g of protein per serving   Free water flushes 77ml q4 hours to maintain tube patency   Regimen provides 1880kcal/day, 97g/day protein and 1039ml/day free water   Liquid MVI daily via tube until goal tube feeds reached  Pt at moderate refeed risk; recommend monitor potassium, magnesium and phosphorus labs daily until stable  NUTRITION DIAGNOSIS:   Inadequate oral intake related to inability to eat as evidenced by NPO status. -Ongoing.  GOAL:   Patient will meet greater than or equal to 90% of their needs -progressing with initiation of tube feeds   MONITOR:   Labs,Weight trends,TF tolerance,Skin,I & O's  ASSESSMENT:   55 year old female with PMHx of HTN admitted after acute cardiac arrest also with DKA and severe acidosis.  1/21 s/p G-tube placement  Pt with concerns for aspiration. Reportedly, tube feeds were aspirated from trach on 2/12 as pt's head was unable to be kept elevated. Tube feeds were held on 2/12. Pt evaluated by IR and surgery for possible G-J tube placement. PEG tube site needs to heal more prior to attempting to exchange for G-J tube per surgery note. Pt was previously tolerating continuous tube feeds while in the ICU. Plan is to restart continuous tube feeds today. Per chart, pt is down ~34lbs(13%) since admit; this is significant weight loss. Plan is for CIR at discharge.   Medications reviewed and include: insulin, protonix, miralax, senokot, unasyn, NaCl w/ KCl & 5% dextrose @75ml /hr  Labs reviewed: Na 146(H), K 2.8(L), P 4.1 wnl, Mg 1.8 wnl Hgb 10.8(L), Hct 34.8(L) cbgs- 143, 158 x 24 hrs  Diet Order:   Diet Order    None     EDUCATION NEEDS:   No education needs have been identified at this  time  Skin:  Skin Assessment: Reviewed RN Assessment  Last BM:  2/13- type 6  Height:   Ht Readings from Last 1 Encounters:  12/17/20 5' 0.98" (1.549 m)   Weight:   Wt Readings from Last 1 Encounters:  01/07/21 74.3 kg   Ideal Body Weight:  47.7 kg  BMI:  Body mass index is 30.97 kg/m.  Estimated Nutritional Needs:   Kcal:  1700-1900  Protein:  85-95 grams  Fluid:  >/= 2 L/day  Koleen Distance MS, RD, LDN Please refer to [facility] for RD and/or RD on-call/weekend/after hours pager

## 2021-01-08 LAB — CBC WITH DIFFERENTIAL/PLATELET
Abs Immature Granulocytes: 0.01 10*3/uL (ref 0.00–0.07)
Basophils Absolute: 0 10*3/uL (ref 0.0–0.1)
Basophils Relative: 0 %
Eosinophils Absolute: 0.2 10*3/uL (ref 0.0–0.5)
Eosinophils Relative: 4 %
HCT: 35.1 % — ABNORMAL LOW (ref 36.0–46.0)
Hemoglobin: 10.9 g/dL — ABNORMAL LOW (ref 12.0–15.0)
Immature Granulocytes: 0 %
Lymphocytes Relative: 30 %
Lymphs Abs: 1.7 10*3/uL (ref 0.7–4.0)
MCH: 26.6 pg (ref 26.0–34.0)
MCHC: 31.1 g/dL (ref 30.0–36.0)
MCV: 85.6 fL (ref 80.0–100.0)
Monocytes Absolute: 0.4 10*3/uL (ref 0.1–1.0)
Monocytes Relative: 7 %
Neutro Abs: 3.3 10*3/uL (ref 1.7–7.7)
Neutrophils Relative %: 59 %
Platelets: 203 10*3/uL (ref 150–400)
RBC: 4.1 MIL/uL (ref 3.87–5.11)
RDW: 19.2 % — ABNORMAL HIGH (ref 11.5–15.5)
WBC: 5.6 10*3/uL (ref 4.0–10.5)
nRBC: 0 % (ref 0.0–0.2)

## 2021-01-08 LAB — BASIC METABOLIC PANEL
Anion gap: 11 (ref 5–15)
BUN: 11 mg/dL (ref 6–20)
CO2: 27 mmol/L (ref 22–32)
Calcium: 9 mg/dL (ref 8.9–10.3)
Chloride: 106 mmol/L (ref 98–111)
Creatinine, Ser: 0.72 mg/dL (ref 0.44–1.00)
GFR, Estimated: >60 mL/min (ref >60)
Glucose, Bld: 165 mg/dL — ABNORMAL HIGH (ref 70–99)
Potassium: 2.8 mmol/L — ABNORMAL LOW (ref 3.5–5.1)
Sodium: 144 mmol/L (ref 135–145)

## 2021-01-08 LAB — GLUCOSE, CAPILLARY
Glucose-Capillary: 118 mg/dL — ABNORMAL HIGH (ref 70–99)
Glucose-Capillary: 123 mg/dL — ABNORMAL HIGH (ref 70–99)
Glucose-Capillary: 128 mg/dL — ABNORMAL HIGH (ref 70–99)
Glucose-Capillary: 130 mg/dL — ABNORMAL HIGH (ref 70–99)
Glucose-Capillary: 132 mg/dL — ABNORMAL HIGH (ref 70–99)
Glucose-Capillary: 140 mg/dL — ABNORMAL HIGH (ref 70–99)
Glucose-Capillary: 149 mg/dL — ABNORMAL HIGH (ref 70–99)

## 2021-01-08 LAB — MAGNESIUM: Magnesium: 1.8 mg/dL (ref 1.7–2.4)

## 2021-01-08 LAB — PHOSPHORUS: Phosphorus: 4.6 mg/dL (ref 2.5–4.6)

## 2021-01-08 MED ORDER — MAGNESIUM SULFATE 2 GM/50ML IV SOLN
2.0000 g | Freq: Once | INTRAVENOUS | Status: AC
  Administered 2021-01-08: 2 g via INTRAVENOUS
  Filled 2021-01-08: qty 50

## 2021-01-08 MED ORDER — POTASSIUM CHLORIDE 20 MEQ PO PACK
40.0000 meq | PACK | Freq: Three times a day (TID) | ORAL | Status: AC
  Administered 2021-01-08 (×3): 40 meq via ORAL
  Filled 2021-01-08 (×3): qty 2

## 2021-01-08 MED ORDER — OXYCODONE HCL 5 MG PO TABS
5.0000 mg | ORAL_TABLET | Freq: Three times a day (TID) | ORAL | Status: DC
Start: 2021-01-08 — End: 2021-01-10
  Administered 2021-01-08 – 2021-01-10 (×6): 5 mg
  Filled 2021-01-08 (×6): qty 1

## 2021-01-08 MED ORDER — CLONAZEPAM 0.5 MG PO TABS
0.5000 mg | ORAL_TABLET | Freq: Two times a day (BID) | ORAL | Status: DC
  Administered 2021-01-08 – 2021-01-10 (×5): 0.5 mg
  Filled 2021-01-08 (×5): qty 1

## 2021-01-08 NOTE — Progress Notes (Signed)
[facility] PROGRESS NOTE    [name] [name]  DTO:[id number] DOB: January 27, 1966 DOA: 12/02/2020 PCP: Marinda Elk, MD      Brief Narrative:  [name] [name] is a 55 y.o. F with hx HTN, pre-diabetes, and obesity who presented after unwitnessed cardiac arrest.  Last seen by family 1 hr prior to being found down at home.  Vfib on arrival of EMS.  One shock/epi and ROSC in field.     1/10 admitted and intubated --> hypothermia started 1/11 echocardiogram normal EF 1/21 PEG tube placement and trach placement 2/7 transferred [place] 2/13 concern for aspiration pneumonia after low grade fever and aspiration event   Assessment & Plan:  V. fib arrest Patient with unknown downtime, Vfib on arrival of EMS. Regained spontaneous circulation after 1 shock and epi. S/p hypothermia on admission.  Although echo was done, and troponin only peaked at ~900, I do not see that she has had a cardiology evaluation for her V fib arrest    -Consult Cardiology for ischemic evaluation post V. Fib arrest   Acute respiratory failure with hypoxia and hypercapnia due to cardiac arrest Anoxic brain injury Status post tracheostomy and PEG tube placement -Continue Seroquel -Continue Keppra -Begin to wean Klonopin   Aspiration pneumonia bilateral lower lobes -Continue Unasyn, day 4 of 5  Swallowing dysfunction -Continue Osmolite, prosource and free water and MVI  Prediabetes Diabetes and DKA ruled out -Stop lantus -Monitor Accuchecks  Hypertension BP controlled -Continue amlodipine  Other medications -Continue atorvastatin   Anemia Hgb stable  Hypokalemia -Supplement K        Disposition: Status is: Inpatient  Remains inpatient appropriate because:Unsafe d/c plan   Dispo:  Patient From: Home  Planned Disposition: Inpatient Rehab  Expected discharge date: 01/09/2021  Medically stable for discharge: No         Level of care: Progressive  Cardiac       MDM: The below labs and imaging reports were reviewed and summarized above.  Medication management as above.    DVT prophylaxis: SCDs Start: 12/02/20 0934  Code Status: Partial Family Communication: Husband at bedside    Consultants:   Cardiology  Neurology  GI  Pallaitive Care  General Surgery  Procedures:   1/10 EEG -- diffuse encephalopathy, no epileptiform discharges 1/11 echocardiogram -- EF normal 1/12 EEG -- diffuse encephalopathy, no epileptiform discharges 1/21 PEG tube placement and trach placement 1/31 EEG -- diffuse encephalopathy, no epileptiform discharges 2/7 EEG -- diffuse encephalopathy, no epileptiform discharges          Subjective: No fever.  No respiratory distress.  No vomiting.  No apparent pain.  Patient is nonverbal.  Objective: Vitals:   01/08/21 0816 01/08/21 1136 01/08/21 1339 01/08/21 1627  BP:  113/68  124/81  Pulse:  91  85  Resp:    15  Temp:  98.2 F (36.8 C)  98.4 F (36.9 C)  TempSrc:    Oral  SpO2: 94% 95% 96% 98%  Weight:      Height:        Intake/Output Summary (Last 24 hours) at 01/08/2021 1709 Last data filed at 01/08/2021 1623 Gross per 24 hour  Intake 1534.78 ml  Output -  Net 1534.78 ml   Filed Weights   01/06/21 0300 01/07/21 0405 01/08/21 0500  Weight: 80.8 kg 74.3 kg 73 kg    Examination: General appearance:  adult female, awake, intermittently makes eye contact, no acute distress.  Lying in bed, has tracheostomy in  place. HEENT: Anicteric, conjunctiva pink, lids and lashes normal. No nasal deformity, discharge, epistaxis.  Lips moist, tracheostomy in place, no secretions in the trach, oropharynx moist, no oral lesions.   Skin: Warm and dry.  No jaundice.  No suspicious rashes or lesions. Cardiac: RRR, nl S1-S2, no murmurs appreciated.    Respiratory: Normal respiratory rate and rhythm.  CTAB without rales or wheezes. Abdomen: Abdomen soft.  No grimace to palpation or guarding.  No ascites, distension, hepatosplenomegaly.   MSK: No deformities or effusions. Neuro: Awake and squeezes my hand to command, seems to nod head to husband's command around, appears to have weak but symmetric upper extremity strength.  Weakness but symmetric lower extremities.  No spontaneous verbalizations, does not consistently follow commands.   Psych: Unable to assess.    Data Reviewed: I have personally reviewed following labs and imaging studies:  CBC: Recent Labs  Lab 01/04/21 0421 01/05/21 0440 01/06/21 0404 01/07/21 0458 01/08/21 0707  WBC 7.6 7.4 7.1 6.1 5.6  NEUTROABS 4.7 4.9 4.6 3.6 3.3  HGB 10.2* 10.7* 10.6* 10.8* 10.9*  HCT 34.3* 35.4* 35.1* 34.8* 35.1*  MCV 87.5 86.6 86.0 85.7 85.6  PLT 245 210 208 208 203   Basic Metabolic Panel: Recent Labs  Lab 01/04/21 0421 01/05/21 0440 01/06/21 0404 01/07/21 0458 01/08/21 0707  NA 149* 143 145 146* 144  K 3.8 3.5 3.3* 2.8* 2.8*  CL 106 104 105 107 106  CO2 30 29 27 29 27   GLUCOSE 116* 145* 126* 126* 165*  BUN 19 15 9 7 11   CREATININE 0.61 0.58 0.45 0.66 0.72  CALCIUM 9.6 9.3 9.3 9.3 9.0  MG 2.2 2.2 2.1 1.8 1.8  PHOS 5.2* 5.4* 3.8 4.1 4.6   GFR: Estimated Creatinine Clearance: 73.5 mL/min (by C-G formula based on SCr of 0.72 mg/dL). Liver Function Tests: Recent Labs  Lab 01/02/21 0935 01/03/21 0428 01/04/21 0421 01/05/21 0440  AST 24 23 23 27   ALT 19 19 16 16   ALKPHOS 82 75 75 70  BILITOT 0.4 0.3 0.3 0.5  PROT 8.4* 8.1 7.8 7.7  ALBUMIN 3.3* 3.1* 3.0* 3.1*   No results for input(s): LIPASE, AMYLASE in the last 168 hours. No results for input(s): AMMONIA in the last 168 hours. Coagulation Profile: No results for input(s): INR, PROTIME in the last 168 hours. Cardiac Enzymes: No results for input(s): CKTOTAL, CKMB, CKMBINDEX, TROPONINI in the last 168 hours. BNP (last 3 results) No results for input(s): PROBNP in the last 8760 hours. HbA1C: No results for input(s): HGBA1C in the last 72  hours. CBG: Recent Labs  Lab 01/08/21 0042 01/08/21 0523 01/08/21 0717 01/08/21 1136 01/08/21 1629  GLUCAP 128* 132* 140* 123* 149*   Lipid Profile: No results for input(s): CHOL, HDL, LDLCALC, TRIG, CHOLHDL, LDLDIRECT in the last 72 hours. Thyroid Function Tests: No results for input(s): TSH, T4TOTAL, FREET4, T3FREE, THYROIDAB in the last 72 hours. Anemia Panel: No results for input(s): VITAMINB12, FOLATE, FERRITIN, TIBC, IRON, RETICCTPCT in the last 72 hours. Urine analysis:    Component Value Date/Time   COLORURINE YELLOW (A) 12/10/2020 1430   APPEARANCEUR HAZY (A) 12/10/2020 1430   LABSPEC 1.026 12/10/2020 1430   PHURINE 5.0 12/10/2020 1430   GLUCOSEU NEGATIVE 12/10/2020 1430   HGBUR NEGATIVE 12/10/2020 1430   BILIRUBINUR NEGATIVE 12/10/2020 1430   KETONESUR NEGATIVE 12/10/2020 1430   PROTEINUR 30 (A) 12/10/2020 1430   NITRITE NEGATIVE 12/10/2020 1430   LEUKOCYTESUR TRACE (A) 12/10/2020 1430   Sepsis Labs: @LABRCNTIP (procalcitonin:4,lacticacidven:4)  )  No results found for this or any previous visit (from the past 240 hour(s)).       Radiology Studies: No results found.      Scheduled Meds: . sodium chloride   Intravenous Once  . alteplase  2 mg Intracatheter Once  . amLODipine  10 mg Per Tube Daily  . atorvastatin  80 mg Per Tube Daily  . chlorhexidine gluconate (MEDLINE KIT)  15 mL Mouth Rinse BID  . Chlorhexidine Gluconate Cloth  6 each Topical Daily  . clonazePAM  0.5 mg Per Tube BID  . feeding supplement (OSMOLITE 1.5 CAL)  1,000 mL Per Tube Q24H  . feeding supplement (PROSource TF)  45 mL Per Tube BID  . free water  30 mL Per Tube Q4H  . insulin aspart  0-15 Units Subcutaneous Q4H  . levETIRAcetam  1,000 mg Per Tube BID  . mouth rinse  15 mL Mouth Rinse 10 times per day  . multivitamin  15 mL Per Tube Daily  . oxyCODONE  5 mg Per Tube Q8H  . polyethylene glycol  17 g Per Tube Daily  . potassium chloride  40 mEq Oral TID  . QUEtiapine  25 mg  Per Tube QHS  . senna-docusate  1 tablet Per Tube Daily  . sodium chloride flush  10-40 mL Intracatheter Q12H   Continuous Infusions: . sodium chloride 250 mL (12/19/20 0449)  . ampicillin-sulbactam (UNASYN) IV Stopped (01/08/21 1450)  . dextrose 50 mL/hr at 01/08/21 1623     LOS: 37 days    Time spent: 25 minutes    Edwin Dada, MD [facility] 01/08/2021, 5:09 PM     Please page though [employer] or Epic secure chat:  For [employer], Adult nurse

## 2021-01-08 NOTE — Progress Notes (Signed)
Inpatient Rehab Admissions Coordinator:   I have no beds available for this patient to admit to CIR today.  Will continue to follow for timing of potential admission pending bed availability.   Shann Medal, PT, DPT Admissions Coordinator [phone] 01/08/21  10:30 AM

## 2021-01-09 ENCOUNTER — Inpatient Hospital Stay: Payer: Self-pay

## 2021-01-09 DIAGNOSIS — E1311 Other specified diabetes mellitus with ketoacidosis with coma: Secondary | ICD-10-CM

## 2021-01-09 LAB — BASIC METABOLIC PANEL
Anion gap: 11 (ref 5–15)
BUN: 9 mg/dL (ref 6–20)
CO2: 27 mmol/L (ref 22–32)
Calcium: 9.1 mg/dL (ref 8.9–10.3)
Chloride: 106 mmol/L (ref 98–111)
Creatinine, Ser: 0.48 mg/dL (ref 0.44–1.00)
GFR, Estimated: >60 mL/min (ref >60)
Glucose, Bld: 162 mg/dL — ABNORMAL HIGH (ref 70–99)
Potassium: 3.1 mmol/L — ABNORMAL LOW (ref 3.5–5.1)
Sodium: 144 mmol/L (ref 135–145)

## 2021-01-09 LAB — GLUCOSE, CAPILLARY
Glucose-Capillary: 147 mg/dL — ABNORMAL HIGH (ref 70–99)
Glucose-Capillary: 147 mg/dL — ABNORMAL HIGH (ref 70–99)
Glucose-Capillary: 138 mg/dL — ABNORMAL HIGH (ref 70–99)
Glucose-Capillary: 158 mg/dL — ABNORMAL HIGH (ref 70–99)
Glucose-Capillary: 140 mg/dL — ABNORMAL HIGH (ref 70–99)

## 2021-01-09 LAB — CBC
HCT: 35.4 % — ABNORMAL LOW (ref 36.0–46.0)
Hemoglobin: 11 g/dL — ABNORMAL LOW (ref 12.0–15.0)
MCH: 26.6 pg (ref 26.0–34.0)
MCHC: 31.1 g/dL (ref 30.0–36.0)
MCV: 85.7 fL (ref 80.0–100.0)
Platelets: 228 10*3/uL (ref 150–400)
RBC: 4.13 MIL/uL (ref 3.87–5.11)
RDW: 19.6 % — ABNORMAL HIGH (ref 11.5–15.5)
WBC: 7.6 10*3/uL (ref 4.0–10.5)
nRBC: 0 % (ref 0.0–0.2)

## 2021-01-09 LAB — MAGNESIUM: Magnesium: 1.9 mg/dL (ref 1.7–2.4)

## 2021-01-09 LAB — PHOSPHORUS: Phosphorus: 3.6 mg/dL (ref 2.5–4.6)

## 2021-01-09 LAB — TSH: TSH: 1.241 u[IU]/mL (ref 0.350–4.500)

## 2021-01-09 IMAGING — DX DG ABDOMEN 1V
2 series · 3 of 3 positions shown · non-contrast
Comparison: Abdominal x-ray dated [DATE].

CLINICAL DATA: Vomiting.

EXAM:
ABDOMEN - 1 VIEW

[Series 1: abdomen supine · 0.14mm/px · 2 of 2 slices shown (1 of 2)]
[im 1/2]
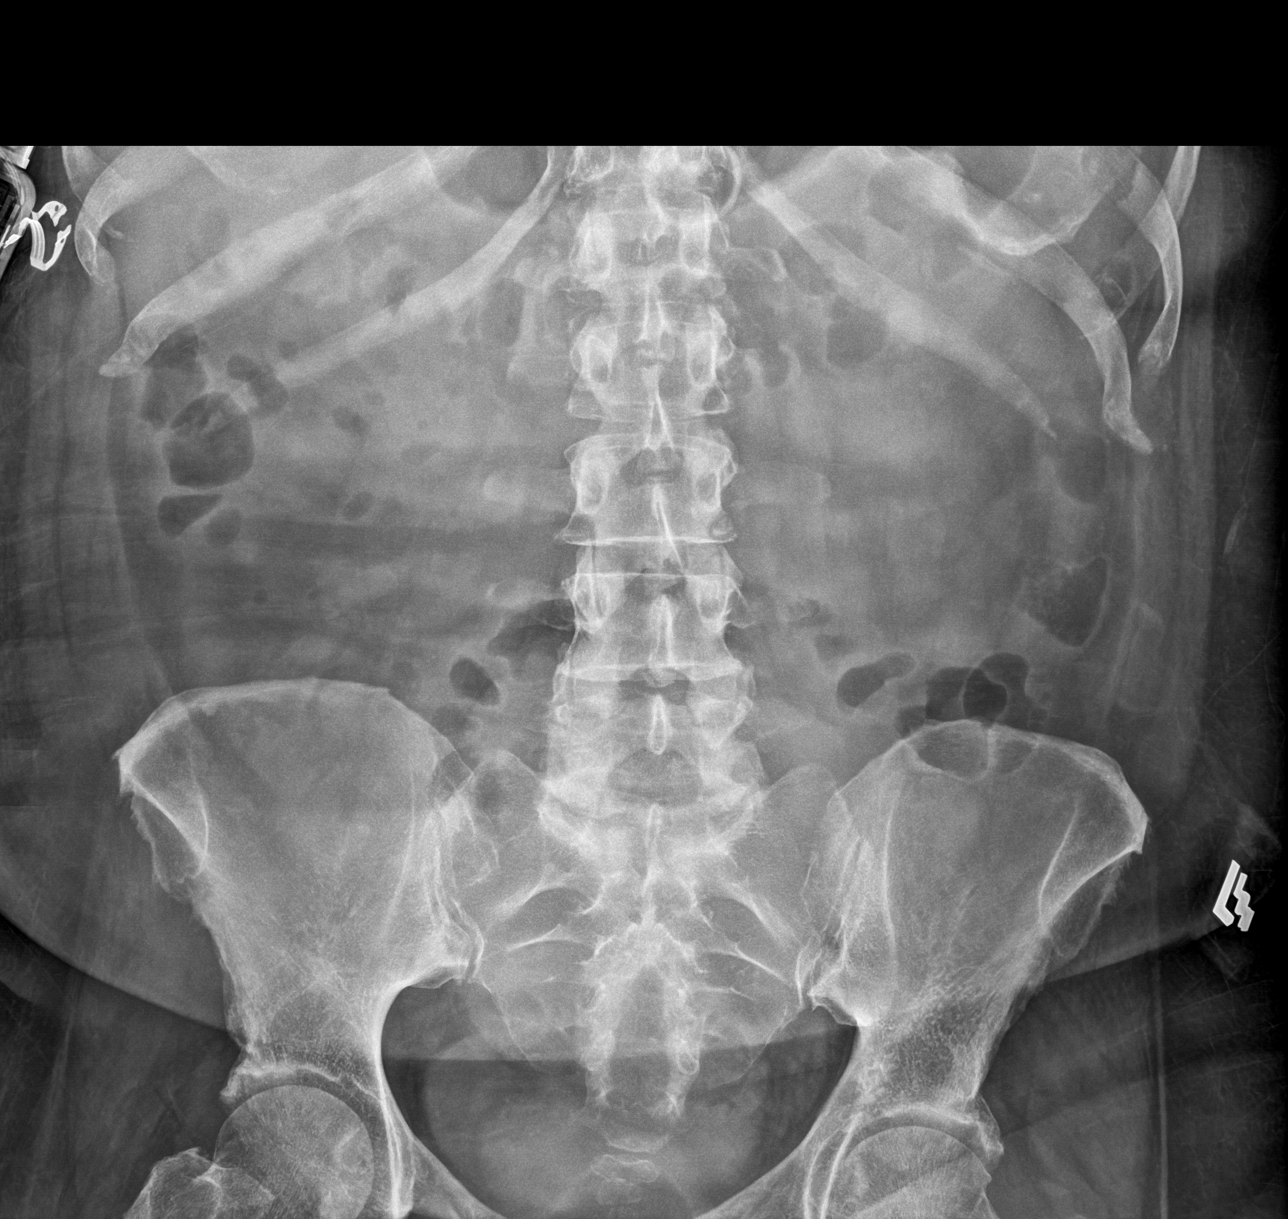
[im 2/2]
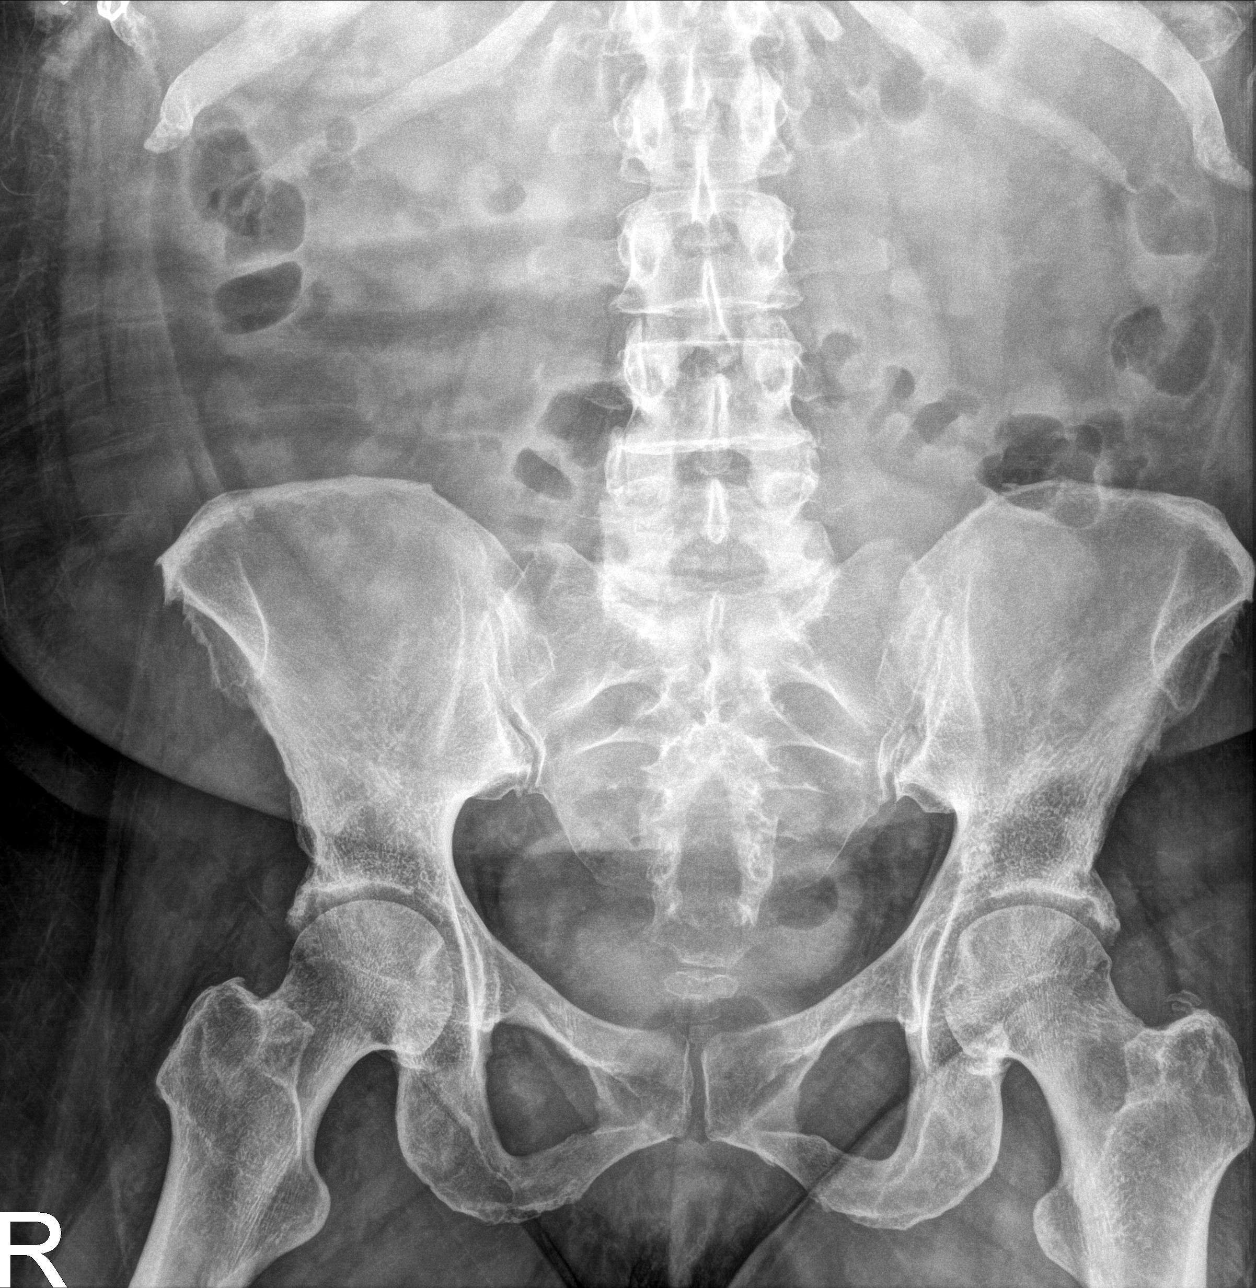

[abdomen supine (2 of 2)]
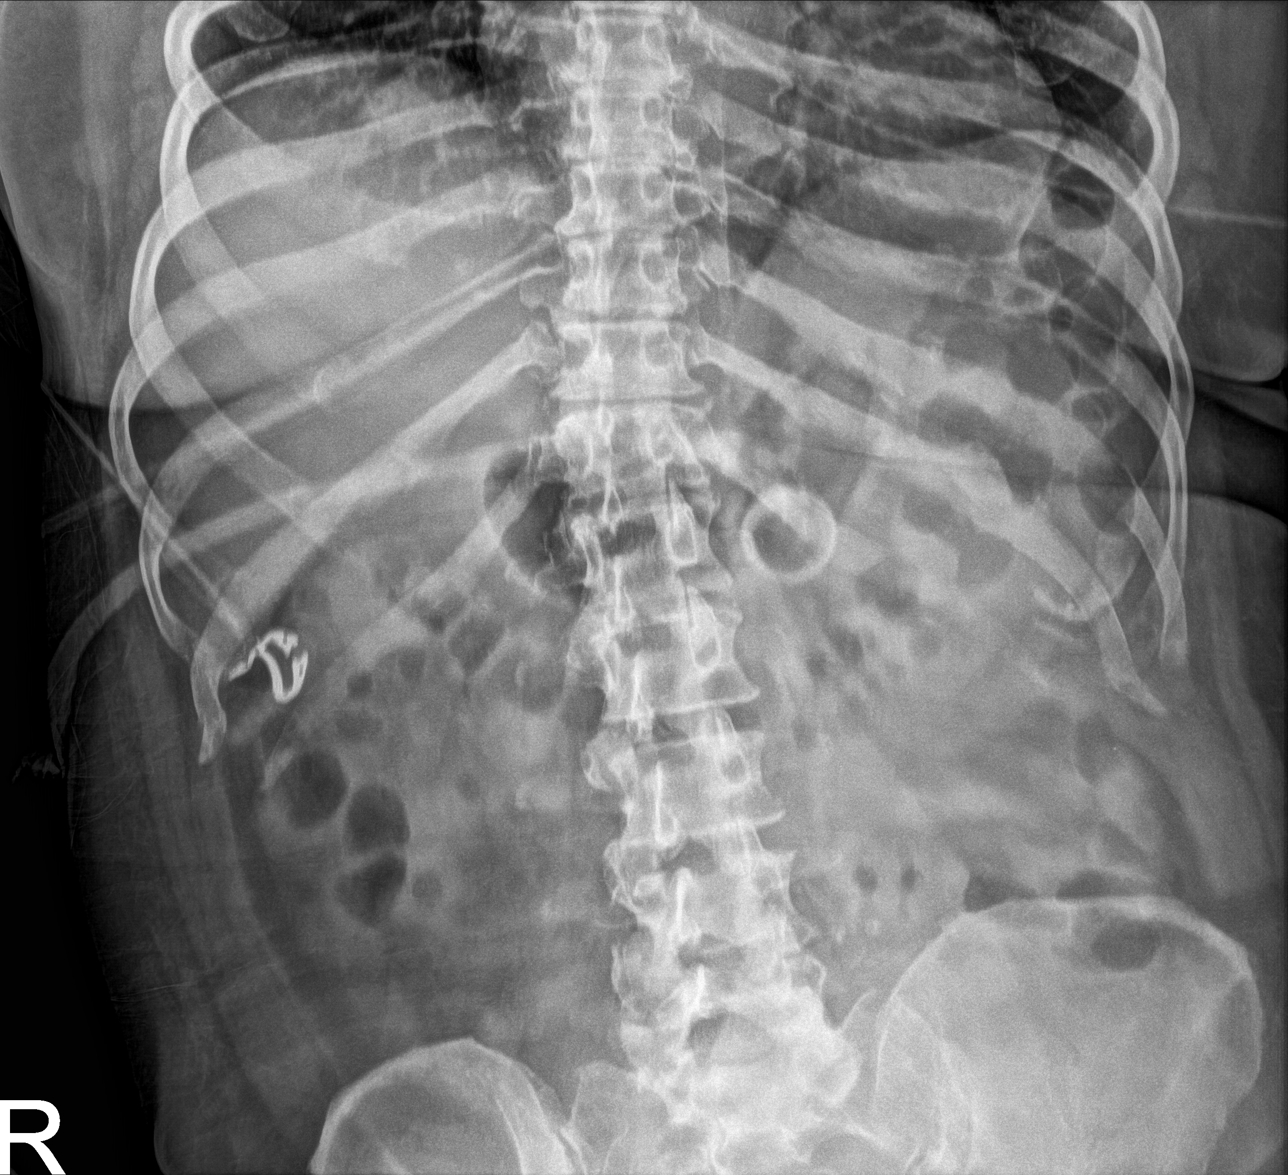

[3 of 3 positions shown; findings below may reference images not displayed]

FINDINGS: Gastrostomy tube again noted. The bowel gas pattern is normal. No
radio-opaque calculi or other significant radiographic abnormality
are seen. No acute osseous abnormality.
IMPRESSION: Negative.

## 2021-01-09 MED ORDER — METOPROLOL TARTRATE 25 MG PO TABS
25.0000 mg | ORAL_TABLET | Freq: Two times a day (BID) | ORAL | Status: DC
  Administered 2021-01-09 – 2021-01-10 (×2): 25 mg
  Filled 2021-01-09 (×2): qty 1

## 2021-01-09 MED ORDER — POTASSIUM CHLORIDE 20 MEQ PO PACK
40.0000 meq | PACK | Freq: Two times a day (BID) | ORAL | Status: AC
  Administered 2021-01-09 (×2): 40 meq via ORAL
  Filled 2021-01-09 (×2): qty 2

## 2021-01-09 MED ORDER — METOCLOPRAMIDE HCL 10 MG/10ML PO SOLN
5.0000 mg | Freq: Three times a day (TID) | ORAL | Status: DC
  Administered 2021-01-09 – 2021-01-10 (×3): 5 mg
  Filled 2021-01-09 (×6): qty 5

## 2021-01-09 NOTE — Progress Notes (Signed)
[facility] PROGRESS NOTE    [name] [name]  VZC:[id number] DOB: [birth year] DOA: 12/02/2020 PCP: Marinda Elk, MD      Brief Narrative:  [name] [name] is a 55 y.o. F with hx HTN, pre-diabetes, and obesity who presented after unwitnessed cardiac arrest.  Last seen by family 1 hr prior to being found down at home.  Vfib on arrival of EMS.  One shock/epi and ROSC in field.     1/10 admitted and intubated --> hypothermia started 1/11 echocardiogram normal EF 1/21 PEG tube placement and trach placement 2/7 transferred [place] 2/13 concern for aspiration pneumonia after low grade fever and aspiration event        Assessment & Plan:  V. fib arrest     Acute respiratory failure with hypoxia and hypercapnia due to cardiac arrest Anoxic brain injury status post tracheostomy and PEG tube placement -Continue Seroquel and Keppra -Continue Klonopin, slow weaning   Aspiration pneumonia bilateral lower lobes -Continue Unasyn, day 5 of 5  Vomiting Patient tolerated her tube feeds well for several weeks, but not last week is vomited numerous times and aspirated on them once, requiring antibiotics last 5 days.  She vomited again last night. -Maintain cuffed trach -Check KUB  Addendum: KUB unremarkable, no ileus or bowel obstruction -Start Reglan  Swallowing dysfunction -Continue Osmolite, Prosource, free water, and MVI  Prediabetes Diabetes and DKA ruled out Glucose is normal in the last 24 hours off Lantus -Monitor Accuchecks and discontinue sliding scale corrections  Hypertension BP controlled -Continue amlodipine  Other medications -Continue atorvastatin   Anemia Hgb unchanged, no clinical bleeding  Hypokalemia Still low -Supplement potassium -Repeat potassium tomorrow        Disposition: Status is: Inpatient  Remains inpatient appropriate because:Unsafe d/c plan   Dispo:  Patient From: Home  Planned Disposition: Inpatient  Rehab  Expected discharge date: 01/11/2021  Medically stable for discharge: No         Level of care: Progressive Cardiac       MDM: The below labs and imaging reports were reviewed and summarized above.  Medication management as above.    DVT prophylaxis: SCDs Start: 12/02/20 0934  Code Status: Partial Family Communication: Husband at bedside    Consultants:   Cardiology  Neurology  GI  Pallaitive Care  General Surgery  Procedures:   1/10 EEG -- diffuse encephalopathy, no epileptiform discharges 1/11 echocardiogram -- EF normal 1/12 EEG -- diffuse encephalopathy, no epileptiform discharges 1/21 PEG tube placement and trach placement 1/31 EEG -- diffuse encephalopathy, no epileptiform discharges 2/7 EEG -- diffuse encephalopathy, no epileptiform discharges          Subjective: More vomiting overnight, no fever.  No respiratory distress.  No apparent pain.  Patient remains nonverbal.  Objective: Vitals:   01/09/21 0722 01/09/21 0859 01/09/21 1112 01/09/21 1600  BP: (!) 142/86  (!) 134/94 134/89  Pulse: 96  (!) 104 91  Resp: 18  20 18   Temp: 98 F (36.7 C)  98.2 F (36.8 C) 98 F (36.7 C)  TempSrc: Oral  Oral   SpO2: 98% 92% 96% 99%  Weight:      Height:        Intake/Output Summary (Last 24 hours) at 01/09/2021 1645 Last data filed at 01/09/2021 1500 Gross per 24 hour  Intake 2416.94 ml  Output --  Net 2416.94 ml   Filed Weights   01/07/21 0405 01/08/21 0500 01/09/21 0500  Weight: 74.3 kg 73 kg 75.5 kg  Examination: General appearance: Adult female, lying in bed, makes eye contact, inconsistently, tracheostomy in place, curled up in a ball     HEENT:    Skin:  Cardiac: RRR, no murmurs, no lower extremity edema Respiratory: Normal respiratory rate and rhythm, lungs clear without rales or wheezes Abdomen: Abdomen soft without grimace to palpation or guarding.  No ascites or distention. MSK:  Neuro: Awake and follows simple  commands, opens her mouth to command, offers her hand, but appears to have symmetric severe weakness in all 4 extremities, some contractures.  No spontaneous verbalizations, does not consistently follow commands Psych: Unable to assess         Data Reviewed: I have personally reviewed following labs and imaging studies:  CBC: Recent Labs  Lab 01/04/21 0421 01/05/21 0440 01/06/21 0404 01/07/21 0458 01/08/21 0707 01/09/21 0551  WBC 7.6 7.4 7.1 6.1 5.6 7.6  NEUTROABS 4.7 4.9 4.6 3.6 3.3  --   HGB 10.2* 10.7* 10.6* 10.8* 10.9* 11.0*  HCT 34.3* 35.4* 35.1* 34.8* 35.1* 35.4*  MCV 87.5 86.6 86.0 85.7 85.6 85.7  PLT 245 210 208 208 203 250   Basic Metabolic Panel: Recent Labs  Lab 01/05/21 0440 01/06/21 0404 01/07/21 0458 01/08/21 0707 01/09/21 0551  NA 143 145 146* 144 144  K 3.5 3.3* 2.8* 2.8* 3.1*  CL 104 105 107 106 106  CO2 29 27 29 27 27   GLUCOSE 145* 126* 126* 165* 162*  BUN 15 9 7 11 9   CREATININE 0.58 0.45 0.66 0.72 0.48  CALCIUM 9.3 9.3 9.3 9.0 9.1  MG 2.2 2.1 1.8 1.8 1.9  PHOS 5.4* 3.8 4.1 4.6 3.6   GFR: Estimated Creatinine Clearance: 74.7 mL/min (by C-G formula based on SCr of 0.48 mg/dL). Liver Function Tests: Recent Labs  Lab 01/03/21 0428 01/04/21 0421 01/05/21 0440  AST 23 23 27   ALT 19 16 16   ALKPHOS 75 75 70  BILITOT 0.3 0.3 0.5  PROT 8.1 7.8 7.7  ALBUMIN 3.1* 3.0* 3.1*   No results for input(s): LIPASE, AMYLASE in the last 168 hours. No results for input(s): AMMONIA in the last 168 hours. Coagulation Profile: No results for input(s): INR, PROTIME in the last 168 hours. Cardiac Enzymes: No results for input(s): CKTOTAL, CKMB, CKMBINDEX, TROPONINI in the last 168 hours. BNP (last 3 results) No results for input(s): PROBNP in the last 8760 hours. HbA1C: No results for input(s): HGBA1C in the last 72 hours. CBG: Recent Labs  Lab 01/08/21 2031 01/08/21 2337 01/09/21 0424 01/09/21 0724 01/09/21 1136  GLUCAP 130* 118* 147* 147* 138*    Lipid Profile: No results for input(s): CHOL, HDL, LDLCALC, TRIG, CHOLHDL, LDLDIRECT in the last 72 hours. Thyroid Function Tests: Recent Labs    01/09/21 0551  TSH 1.241   Anemia Panel: No results for input(s): VITAMINB12, FOLATE, FERRITIN, TIBC, IRON, RETICCTPCT in the last 72 hours. Urine analysis:    Component Value Date/Time   COLORURINE YELLOW (A) 12/10/2020 1430   APPEARANCEUR HAZY (A) 12/10/2020 1430   LABSPEC 1.026 12/10/2020 1430   PHURINE 5.0 12/10/2020 1430   GLUCOSEU NEGATIVE 12/10/2020 1430   HGBUR NEGATIVE 12/10/2020 1430   BILIRUBINUR NEGATIVE 12/10/2020 1430   KETONESUR NEGATIVE 12/10/2020 1430   PROTEINUR 30 (A) 12/10/2020 1430   NITRITE NEGATIVE 12/10/2020 1430   LEUKOCYTESUR TRACE (A) 12/10/2020 1430   Sepsis Labs: @LABRCNTIP (procalcitonin:4,lacticacidven:4)  )No results found for this or any previous visit (from the past 240 hour(s)).       Radiology Studies: DG  Abd 1 View  Result Date: 01/09/2021 CLINICAL DATA:  Vomiting. EXAM: ABDOMEN - 1 VIEW COMPARISON:  Abdominal x-ray dated December 31, 2020. FINDINGS: Gastrostomy tube again noted. The bowel gas pattern is normal. No radio-opaque calculi or other significant radiographic abnormality are seen. No acute osseous abnormality. IMPRESSION: Negative. Electronically Signed   By: Titus Dubin M.D.   On: 01/09/2021 15:40        Scheduled Meds: . sodium chloride   Intravenous Once  . alteplase  2 mg Intracatheter Once  . amLODipine  10 mg Per Tube Daily  . atorvastatin  80 mg Per Tube Daily  . chlorhexidine gluconate (MEDLINE KIT)  15 mL Mouth Rinse BID  . Chlorhexidine Gluconate Cloth  6 each Topical Daily  . clonazePAM  0.5 mg Per Tube BID  . feeding supplement (OSMOLITE 1.5 CAL)  1,000 mL Per Tube Q24H  . feeding supplement (PROSource TF)  45 mL Per Tube BID  . free water  30 mL Per Tube Q4H  . insulin aspart  0-15 Units Subcutaneous Q4H  . levETIRAcetam  1,000 mg Per Tube BID  . mouth  rinse  15 mL Mouth Rinse 10 times per day  . metoCLOPramide  5 mg Per Tube TID AC  . multivitamin  15 mL Per Tube Daily  . oxyCODONE  5 mg Per Tube Q8H  . polyethylene glycol  17 g Per Tube Daily  . potassium chloride  40 mEq Oral BID  . QUEtiapine  25 mg Per Tube QHS  . senna-docusate  1 tablet Per Tube Daily  . sodium chloride flush  10-40 mL Intracatheter Q12H   Continuous Infusions: . sodium chloride 250 mL (01/08/21 2117)  . ampicillin-sulbactam (UNASYN) IV 1.5 g (01/09/21 1009)  . dextrose 50 mL/hr at 01/09/21 1129     LOS: 38 days    Time spent: 25 minutes    Edwin Dada, MD [facility] 01/09/2021, 4:45 PM     Please page though [facility] or Epic secure chat:  For [employer], Adult nurse

## 2021-01-09 NOTE — Progress Notes (Signed)
Nutrition Follow-up  DOCUMENTATION CODES:   Obesity unspecified  INTERVENTION:   Osmolite 1.5 @50ml /hr- Initiate at 76ml/hr and increase by 69ml/hr q 12 hours until goal rate is reached.   Pro-Source 28ml BID via tube, provides 40kcal and 11g of protein per serving   Free water flushes 44ml q4 hours to maintain tube patency   Regimen provides 1880kcal/day, 97g/day protein and 1074ml/day free water   Liquid MVI daily via tube until goal tube feeds reached  Pt at moderate refeed risk; recommend monitor potassium, magnesium and phosphorus labs daily until stable  NUTRITION DIAGNOSIS:   Inadequate oral intake related to inability to eat as evidenced by NPO status. -Ongoing.  GOAL:   Patient will meet greater than or equal to 90% of their needs -progressing with initiation of tube feeds   MONITOR:   Labs,Weight trends,TF tolerance,Skin,I & O's  ASSESSMENT:   55 year old female with PMHx of HTN admitted after acute cardiac arrest also with DKA and severe acidosis.  1/21 s/p G-tube placement  Pt is receiving tube feeds at 48ml/hr. Spoke with RN this morning, pt vomited once overnight but has continued to receive tube feeds. Spoke with MD, will plan to start reglan today and obtain KUB as pt tolerated tube feeds for a month while in the ICU with no issues and vomiting is new. Will plan to advance tube feeds as tolerated. Pt does remain at refeed risk.    Medications reviewed and include: insulin, reglan, MVI, oxycodone, miralax, KCL, senokot, unasyn, 5% dextrose @50ml /hr  Labs reviewed: K 3.1(L), P 3.6 wnl, Mg 1.9 wnl cbgs- 147, 147 x 24 hrs  Diet Order:   Diet Order    None     EDUCATION NEEDS:   No education needs have been identified at this time  Skin:  Skin Assessment: Reviewed RN Assessment  Last BM:  2/16- type 6  Height:   Ht Readings from Last 1 Encounters:  12/17/20 5' 0.98" (1.549 m)   Weight:   Wt Readings from Last 1 Encounters:  01/09/21 75.5  kg   Ideal Body Weight:  47.7 kg  BMI:  Body mass index is 31.47 kg/m.  Estimated Nutritional Needs:   Kcal:  1700-1900  Protein:  85-95 grams  Fluid:  >/= 2 L/day  Koleen Distance MS, RD, LDN Please refer to [facility] for RD and/or RD on-call/weekend/after hours pager

## 2021-01-09 NOTE — Consult Note (Addendum)
Cardiology Consultation:   Patient ID: [name] [name] MRN: [id number]; DOB: May 18, 1966  Admit date: 12/02/2020 Date of Consult: 01/09/2021  PCP:  Marinda Elk, MD   [place]  Cardiologist: [facility], Dr. Rockey Situ rounding Advanced Practice Provider:  No care team member to display Electrophysiologist:  None 6}    Patient Profile:   [name] [name] is a 55 y.o. female with a hx of HTN, prediabetes, obesity and who is being seen today for the evaluation of unwitnessed cardiac arrest at the request of Dr. Loleta Books.  History of Present Illness:   [name] [name] is a 55 year old female with PMH as above.  She has no previously known or documented history of heart failure or cardiac disease.  The below HPI was obtained through chart biopsy and also by speaking with the patient's husband, who was with her at the time of consultation.  He denies any history of smoking or alcohol use.  No history of any illegal drugs.  He denies a family history of heart disease or any early cardiac death before the age of 35.  He reports the family history is mainly significant for diabetes. 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  They also have children and are very active with them as well.  Prior to her admission as below, her husband reports the patient was very active around the house.  He denied her reporting any chest pain, fatigue, diaphoresis, shortness of breath, or swelling.  No reported fevers or chills prior to the most recent admission.  She reportedly takes 1 medication (currently unknown) for elevated BP.  On the morning of her presentation to [facility], he reports that he went into check on her after her alarm went off at 5:30 AM and she did not start her.  He reports that she usually will turn her alarm off; however, she was not responding to it.  When he got to her bedside, he noticed that her nightgown was completely  wet around the collar.  He also noticed that she had sounds coming from her that sounded as if she was choking or struggling to breathe.  Her eyelids were also swollen.  He called 911 immediately and followed instructions to get her on the ground and start CPR.  He continued the CPR until arrival of EMS.   Per review of chart, she received 2 shocks prior to EMS arrival.  Upon EMS arrival, she is noted to have had ventricular fibrillation.  ACLS protolocol initiated and with 1 round of epinephrine.  Total downtime unknown.    On arrival to [facility] 12/02/20, GCS 3.  She required intubation and underwent hypothermia protocol.  It was thought that severe acidosis from DKA was the likely etiology of her cardiac arrest.  1/10 Labs at presentation showed leukocytosis, anemia, hypokalemia, hyperglycemia, AKI, hypoalbuminemia, transaminitis, lactic acidosis. Specifically, WBC 11.9, hemoglobin 9.7, hematocrit 33.3, sodium 141, potassium 2.1, glucose 357, hemoglobin A1c 5.9, creatinine 1.01 (baseline creatinine 0.5-0.7), calcium 8.5, albumin 3.2, AST 138, ALT 120, high-sensitivity troponin 71  947  115, lactic acid 10.0.    12/03/2020 echo showed EF 60 to 65%, NR WMA, mild LVH, small pericardial effusion posterior and lateral to the LV.  Urine drug screen without significant findings.1/21, she underwent PEG placement for swallowing dysfunction.   1/21-- extubation. 1/21-- PEG tube placement.  2/07 -- transferred out of ICU.  2/13 -- concern for aspiration pna and  started on antibiotics given low grade fever and aspiration event.    On review of EMR, she is still receiving tube feeding.   Emesis x1 overnight 2/16-2/17.  On consultation today 2/17, she is not speaking but nods her head no to any chest pain or shortness of breath.  We reviewed her echo results and the reason for cardiology consultation.  She has not yet started to eat solid foods.  She has a tracheostomy collar on exam with reported 5 L/min  oxygen.  Past Medical History:  Diagnosis Date  . Hypertension     Past Surgical History:  Procedure Laterality Date  . PEG PLACEMENT N/A 12/13/2020   Procedure: PERCUTANEOUS ENDOSCOPIC GASTROSTOMY (PEG) PLACEMENT;  Surgeon: Lesly Rubenstein, MD;  Location: [facility] ENDOSCOPY;  Service: Endoscopy;  Laterality: N/A;  . TRACHEOSTOMY TUBE PLACEMENT N/A 12/13/2020   Procedure: TRACHEOSTOMY;  Surgeon: Clyde Canterbury, MD;  Location: [facility] ORS;  Service: ENT;  Laterality: N/A;     Home Medications:  Prior to Admission medications   Not on File    Inpatient Medications: Scheduled Meds: . sodium chloride   Intravenous Once  . alteplase  2 mg Intracatheter Once  . amLODipine  10 mg Per Tube Daily  . atorvastatin  80 mg Per Tube Daily  . chlorhexidine gluconate (MEDLINE KIT)  15 mL Mouth Rinse BID  . Chlorhexidine Gluconate Cloth  6 each Topical Daily  . clonazePAM  0.5 mg Per Tube BID  . feeding supplement (OSMOLITE 1.5 CAL)  1,000 mL Per Tube Q24H  . feeding supplement (PROSource TF)  45 mL Per Tube BID  . free water  30 mL Per Tube Q4H  . insulin aspart  0-15 Units Subcutaneous Q4H  . levETIRAcetam  1,000 mg Per Tube BID  . mouth rinse  15 mL Mouth Rinse 10 times per day  . metoCLOPramide  5 mg Per Tube TID AC  . multivitamin  15 mL Per Tube Daily  . oxyCODONE  5 mg Per Tube Q8H  . polyethylene glycol  17 g Per Tube Daily  . potassium chloride  40 mEq Oral BID  . QUEtiapine  25 mg Per Tube QHS  . senna-docusate  1 tablet Per Tube Daily  . sodium chloride flush  10-40 mL Intracatheter Q12H   Continuous Infusions: . sodium chloride 250 mL (01/08/21 2117)  . ampicillin-sulbactam (UNASYN) IV 1.5 g (01/09/21 1009)  . dextrose 50 mL/hr at 01/09/21 1129   PRN Meds: acetaminophen, bisacodyl, cloNIDine, guaiFENesin, hydrALAZINE, midazolam, ondansetron (ZOFRAN) IV, sodium chloride flush  Allergies:   No Known Allergies  Social History:   Social History   Socioeconomic History  .  Marital status: Married    Spouse name: Not on file  . Number of children: Not on file  . Years of education: Not on file  . Highest education level: Not on file  Occupational History  . Not on file  Tobacco Use  . Smoking status: Former Smoker    Quit date: 2007    Years since quitting: 15.1  . Smokeless tobacco: Never Used  Vaping Use  . Vaping Use: Never used  Substance and Sexual Activity  . Alcohol use: No  . Drug use: Never  . Sexual activity: Not on file  Other Topics Concern  . Not on file  Social History Narrative  . Not on file   Social Determinants of Health   Financial Resource Strain: Not on file  Food Insecurity: Not on file  Transportation Needs:  Not on file  Physical Activity: Not on file  Stress: Not on file  Social Connections: Not on file  Intimate Partner Violence: Not on file    Family History:   No reported family history of early cardiac death before the age of 32 or heart failure. Family History  Problem Relation Age of Onset  . Hypertension Mother   . Leukemia Mother   . Diabetes Mother   . Healthy Father      ROS:  Please see the history of present illness.  Review of Systems  Constitutional: Negative for malaise/fatigue.  Respiratory: Positive for cough. Negative for shortness of breath.   Cardiovascular: Negative for chest pain, palpitations, leg swelling and PND.  Gastrointestinal: Positive for vomiting.  Neurological:       Not speaking on exam though would open her eyes and shake her head yes and no to questions    All other ROS reviewed and negative.     Physical Exam/Data:   Vitals:   01/09/21 0500 01/09/21 0722 01/09/21 0859 01/09/21 1112  BP:  (!) 142/86  (!) 134/94  Pulse:  96  (!) 104  Resp:  18  20  Temp:  98 F (36.7 C)  98.2 F (36.8 C)  TempSrc:  Oral  Oral  SpO2:  98% 92% 96%  Weight: 75.5 kg     Height:        Intake/Output Summary (Last 24 hours) at 01/09/2021 1410 Last data filed at 01/09/2021  0400 Gross per 24 hour  Intake 2974.42 ml  Output -  Net 2974.42 ml   Last 3 Weights 01/09/2021 01/08/2021 01/07/2021  Weight (lbs) 166 lb 7.2 oz 160 lb 15 oz 163 lb 12.8 oz  Weight (kg) 75.5 kg 73 kg 74.299 kg     Body mass index is 31.47 kg/m.  General:  Well nourished, well developed, in no acute distress.  Joined by her husband. HEENT: normal.  Tracheostomy collar in place.  She is not currently wearing her oxygen. Neck: JVD difficult to assess due to tracheostomy collar Vascular: No carotid bruits; FA pulses 2+ bilaterally without bruits  Cardiac:  normal S1, S2; RRR; no murmur  Lungs: Anterior auscultation only and clear bilaterally with consideration of poor inspiratory effort.  Abd: soft, nontender, no hepatomegaly  Ext: no edema Musculoskeletal:  No deformities Skin: warm and dry  Neuro: Able to shake her head yes and no to questions.  No speaking. Psych:  Normal affect   EKG:  The EKG was personally reviewed and demonstrates:  EKG NSR, 97 bpm, baseline wander, QTC 502,  Telemetry:  Telemetry was personally reviewed and demonstrates: NSR-ST  Relevant CV Studies: Echo 12/03/20 1. Left ventricular ejection fraction, by estimation, is 60 to 65%. The  left ventricle has normal function. The left ventricle has no regional  wall motion abnormalities. There is mild left ventricular hypertrophy.  Left ventricular diastolic parameters  were normal.  2. Right ventricular systolic function is normal. The right ventricular  size is normal. Tricuspid regurgitation signal is inadequate for assessing  PA pressure.  3. A small pericardial effusion is present. The pericardial effusion is  posterior and lateral to the left ventricle.  4. The mitral valve is grossly normal. No evidence of mitral valve  regurgitation. No evidence of mitral stenosis.  5. The aortic valve is tricuspid. Aortic valve regurgitation is not  visualized. No aortic stenosis is present.  6. The inferior  vena cava is normal in size with greater than  50%  respiratory variability, suggesting right atrial pressure of 3 mmHg.   Laboratory Data:  High Sensitivity Troponin:   Recent Labs  Lab 12/11/20 1332 12/11/20 1528  TROPONINIHS 60* 115*     Chemistry Recent Labs  Lab 01/07/21 0458 01/08/21 0707 01/09/21 0551  NA 146* 144 144  K 2.8* 2.8* 3.1*  CL 107 106 106  CO2 29 27 27   GLUCOSE 126* 165* 162*  BUN 7 11 9   CREATININE 0.66 0.72 0.48  CALCIUM 9.3 9.0 9.1  GFRNONAA >60 >60 >60  ANIONGAP 10 11 11     Recent Labs  Lab 01/03/21 0428 01/04/21 0421 01/05/21 0440  PROT 8.1 7.8 7.7  ALBUMIN 3.1* 3.0* 3.1*  AST 23 23 27   ALT 19 16 16   ALKPHOS 75 75 70  BILITOT 0.3 0.3 0.5   Hematology Recent Labs  Lab 01/07/21 0458 01/08/21 0707 01/09/21 0551  WBC 6.1 5.6 7.6  RBC 4.06 4.10 4.13  HGB 10.8* 10.9* 11.0*  HCT 34.8* 35.1* 35.4*  MCV 85.7 85.6 85.7  MCH 26.6 26.6 26.6  MCHC 31.0 31.1 31.1  RDW 19.2* 19.2* 19.6*  PLT 208 203 228   BNPNo results for input(s): BNP, PROBNP in the last 168 hours.  DDimer No results for input(s): DDIMER in the last 168 hours.   Radiology/Studies:  No results found.   Assessment and Plan:   Ventricular fibrillation with cardiac arrest Elevated HS Tn, suspected supply demand ischemia  --No current CP. She is a nonsmoker and per husband no significant family or personal history of cardiac dz. Presented 12/02/2020 with unwitnessed cardiac arrest. EMS observed V. fib.  She received shock and epi in the field with ROSC.  Unknown downtime.  At presentation, labs significant for hypokalemia and severe lactic acidosis.  Further presentation lab details as above. She is now s/p extubation with current PEG tube and nonverbal on exam.  --Tn elevation with peak at 947, now down-trending. EKG without acute ST/T changes. Less consistent with ACS. Echo with nl EF. Suspected is elevated HS Tn 2/2 supply demand ischemia in the setting of current comorbid  current conditions as above and including severe lactic acidosis. Consider Vfib and cardiac arrest 2/2 severe lactic acidosis and hypokalemia. Also considered was PE, though lower suspicion for given echo without evidence of right heart strain.  --No current plan for invasive ischemic evaluation at this time given suboptimal candidate in the setting of current comorbid conditions, including difficulty swallowing medications with recent emesis and current aspiration pna. She will need to be able to take medications (DAPT) if any intervention. Also considered is mental status of pt. Once further recovered from pna and if mental status and comorbids allow, reassess with further recommendations at that time. Could consider cardiac CTA (OP, once recovered) for further workup if needed versus MPI. Will defer recommendation for CTA versus V/Q scan to IM if needed, though as above, lower suspicion for PE given lack of R heart strain, as well as presentation / labs obtained at arrival.  --Daily BMET. Replete electrolytes.  --Will order TSH.   --Continue current medications, including statin for risk factor modification. Repeat LDL/ALT in 6-8 weeks.  --Aggressive risk factor modification.  --Further recommendations if indicated pending MD to see pt.  HTN --Continue current metoprolol. Titrate antihypertensives as needed for optimized BP control.   Hypokalemia --2/17 K+ at 3.1. Replete with goal 4.0 to reduce the risk of recurrent arrhythmia. Check Mg with goal 2.0.  Pericardial effusion --As seen on  echo above. Denies CP. Recommend repeat echo to follow and ensure resolved in the OP setting or at follow-up.   Aspiration PNA --Continue abx, per IM.   Anemia --Continue to monitor with daily CBC. Transfuse below 8.0. Per IM.  Acute respiratory failure with hypoxia and hypercapnia Anoxic brain injury --Per IM.  Swallowing dysfunction --PEG tube. Per IM.   Prediabetes --Glycemic control recommended  for risk factor modification.      TIMI Risk Score for Unstable Angina or Non-ST Elevation MI:   The patient's TIMI risk score is 1, which indicates a 5% risk of all cause mortality, new or recurrent myocardial infarction or need for urgent revascularization in the next 14 days.  For questions or updates, please contact [place] Please consult [email or web address] for contact info under    Signed, Arvil Chaco, PA-C  01/09/2021 2:10 PM

## 2021-01-09 NOTE — Progress Notes (Signed)
Physical Therapy Treatment Patient Details Name: [name] [name] MRN: [id number] DOB: [birth year] Today's Date: 01/09/2021    History of Present Illness Pt is a 55 year old female presented to the ED via EMS post witnessed cardiac arrest. ROSC achieved following 3 defibrillations and one round of epinephrine. Patient found to have significant hypokalemia on arrival. Given witnessed arrest, patient Code ICE.  S/p trach and peg tube placement.  Workup showed anoxic brain injury. Pt also showed facial twitching that could be myoclonic versus partial seizure.    PT Comments    Pt scooted down towards bottom of bed upon PT arrival; pt appearing restless moving around in bed; HR 128 bpm.  Pt assisted into sitting edge of bed with max assist x1; initial sitting balance max assist d/t significant posterior lean but with time and vc's/tactile cues sitting balance improved to CGA to min assist x1.  Lateral scoot to L 1x along bed with max assist x2 (use of bed sheet under pt) and 2 assist to lay down and reposition in bed.  Pt appearing more comfortable in bed end of session and HR noted to be 102 bpm at rest.  Pt initially not nodding her head beginning of session to answer therapists questions (yes/no nods) but pt was able to nod yes/no appropriately towards end of session.  Plan of care reviewed and appears appropriate.  Will continue to progress pt with strengthening, balance, and progressive functional mobility during hospitalization.   Follow Up Recommendations  CIR     Equipment Recommendations  Other (comment) (TBD at next level of care)    Recommendations for Other Services OT consult;Rehab consult     Precautions / Restrictions Precautions Precautions: Fall Precaution Comments: Trach collar 5 L; PEG tube Restrictions Weight Bearing Restrictions: No    Mobility  Bed Mobility Overal bed mobility: Needs Assistance Bed Mobility: Supine to Sit;Sit to Supine     Supine to sit: HOB  elevated;Max assist Sit to supine: Total assist;+2 for safety/equipment;+2 for physical assistance   General bed mobility comments: assist for trunk and B LE's; vc's for technique    Transfers Overall transfer level: Needs assistance   Transfers: Lateral/Scoot Transfers          Lateral/Scoot Transfers: Max assist;+2 physical assistance General transfer comment: lateral scoot 1x to L along bed (used of bed sheet to assist); assist for balance d/t mild posterior lean  Ambulation/Gait             General Gait Details: not appropriate at this time   Stairs             Wheelchair Mobility    Modified Rankin (Stroke Patients Only)       Balance Overall balance assessment: Needs assistance Sitting-balance support: Feet supported Sitting balance-Leahy Scale: Poor Sitting balance - Comments: pt initially max assist for sitting balance d/t significant posterior lean but improved to min assist occasionally (d/t R lateral lean at times) but otherwise CGA for safety Postural control: Right lateral lean                                  Cognition Arousal/Alertness: Awake/alert Behavior During Therapy: Flat affect Overall Cognitive Status: Difficult to assess                                 General Comments: pt non-verbal  but tracking with eyes at times; initially not shaking head yes/no beginning of session but then able to perform this towards end of session      Exercises      General Comments   Nursing cleared pt for participation in physical therapy (nursing aware of pt's HR).  Pt agreeable to PT session.      Pertinent Vitals/Pain Pain Assessment: Faces Faces Pain Scale: No hurt Pain Intervention(s): Limited activity within patient's tolerance;Monitored during session;Repositioned  O2 sats 95% or greater on 5 L via trach collar during sessions activities.    Home Living                      Prior Function             PT Goals (current goals can now be found in the care plan section) Acute Rehab PT Goals Patient Stated Goal: none stated PT Goal Formulation: With family Time For Goal Achievement: 01/23/21 Potential to Achieve Goals: Good Progress towards PT goals: Progressing toward goals    Frequency    Min 2X/week      PT Plan Current plan remains appropriate    Co-evaluation              AM-PAC PT "6 Clicks" Mobility   Outcome Measure  Help needed turning from your back to your side while in a flat bed without using bedrails?: Total Help needed moving from lying on your back to sitting on the side of a flat bed without using bedrails?: Total Help needed moving to and from a bed to a chair (including a wheelchair)?: Total Help needed standing up from a chair using your arms (e.g., wheelchair or bedside chair)?: Total Help needed to walk in hospital room?: Total Help needed climbing 3-5 steps with a railing? : Total 6 Click Score: 6    End of Session Equipment Utilized During Treatment: Oxygen (5 L via trach collar) Activity Tolerance: Patient tolerated treatment well Patient left: in bed;with call bell/phone within reach;with bed alarm set Nurse Communication: Mobility status;Precautions;Other (comment) (pt's HR) PT Visit Diagnosis: Other abnormalities of gait and mobility (R26.89);Muscle weakness (generalized) (M62.81);Difficulty in walking, not elsewhere classified (R26.2);Other symptoms and signs involving the nervous system (R29.898)     Time: 7544-9201 PT Time Calculation (min) (ACUTE ONLY): 28 min  Charges:  $Therapeutic Activity: 23-37 mins                    Seanpaul Preece, PT 01/09/21, 11:20 AM

## 2021-01-09 NOTE — H&P (Signed)
Physical Medicine and Rehabilitation Admission H&P    Chief Complaint  Patient presents with  . Cardiac Arrest  : HPI: [name] [name] is a 55 year old right-handed female with history of obesity with BMI 31.47, hypertension on no medications prior to admission as well as remote history of tobacco use.  History taken from chart review due to cognition.  Patient lives with spouse and her children.  Reportedly independent prior to admission.  Her children can help as needed.  Presented to [facility] on 12/02/2020 with witnessed cardiac arrest.  Per EMS she received 2 shocks by FD prior to EMS arrival.  After EMS arrived she was noted to be in ventricular fibrillation.  She did receive 1 round of epi.  Total downtime was unknown.  On arrival to the ED her GCS was 3.  She required intubation for airway protection.  Admission chemistries potassium 2.1, CO2 20, glucose 357, creatinine 1.01, AST 138, ALT 120, hemoglobin 9.7, WBC 11,900, hemoglobin A1c 5.9, troponin 71 and peaked at 900, lactic acid 10 urinalysis negative nitrite.  Code sepsis was initiated placed on ceftriaxone.  CT/MRI unremarkable for acute intracranial process..  Chest x-ray with no edema or airspace opacity.  She was completing a course of Unasyn for aspiration pneumonia.  EEG negative for seizures, however it was suggestive of severe diffuse encephalopathy and patient remains on Keppra for seizure prophylaxis.. Echocardiogram with ejection fraction of 60-65%, no wall motion abnormalities and no signs of right heart strain.  Palliative care was consulted to establish goals of care.  Hospital course tracheostomy 12/13/2020 per Dr. Clyde Canterbury as well as placement of gastrostomy tube for nutritional support on 12/13/2020 per Dr. Andrey Farmer and currently remains n.p.o.  Cardiology services follow-up for ischemic evaluation  post V. fib arrest Dr. Ida Rogue and no current plan for invasive ischemic evaluation at this time given suboptimal  candidate in the setting of current comorbidity conditions... Neurology follow-up for suspect anoxic brain injury with initial CT MRI unremarkable it was repeated 12/23/2020 findings consistent with anoxic brain injury.  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  Evolutionary changes likely related to evolving ischemia compared with prior MRI of 12/07/2020.  Therapy evaluations completed due to patient's cognitive deficits and decreased functional mobility was admitted for a comprehensive rehab program.  Please see preadmission assessment from earlier today as well.  Review of Systems  Unable to perform ROS: Mental acuity   Past Medical History:  Diagnosis Date  . Hypertension    Past Surgical History:  Procedure Laterality Date  . PEG PLACEMENT N/A 12/13/2020   Procedure: PERCUTANEOUS ENDOSCOPIC GASTROSTOMY (PEG) PLACEMENT;  Surgeon: Lesly Rubenstein, MD;  Location: [facility] ENDOSCOPY;  Service: Endoscopy;  Laterality: N/A;  . TRACHEOSTOMY TUBE PLACEMENT N/A 12/13/2020   Procedure: TRACHEOSTOMY;  Surgeon: Clyde Canterbury, MD;  Location: [facility] ORS;  Service: ENT;  Laterality: N/A;   Family History  Problem Relation Age of Onset  . Hypertension Mother   . Leukemia Mother   . Diabetes Mother   . Healthy Father    Social History:  reports that she quit smoking about 15 years ago. She has never used smokeless tobacco. She reports that she does not drink alcohol and does not use drugs. Allergies: No Known Allergies Medications Prior to Admission  Medication Sig Dispense Refill  . acetaminophen (TYLENOL) 325 MG tablet Place 2 tablets (650 mg total) into feeding tube every 8 (eight) hours as needed for  mild pain or fever.    Derrill Memo ON 01/11/2021] amLODipine (NORVASC) 10 MG tablet Place 1 tablet (10 mg total) into feeding tube daily.    Derrill Memo ON 01/11/2021] atorvastatin (LIPITOR) 80 MG tablet Place 1 tablet (80 mg total) into feeding tube daily.    .  bisacodyl (DULCOLAX) 10 MG suppository Place 1 suppository (10 mg total) rectally daily as needed for moderate constipation. 12 suppository 0  . clonazePAM (KLONOPIN) 0.5 MG tablet Place 1 tablet (0.5 mg total) into feeding tube 2 (two) times daily. 30 tablet 0  . guaiFENesin (ROBITUSSIN) 100 MG/5ML SOLN Place 10 mLs (200 mg total) into feeding tube every 4 (four) hours as needed for cough or to loosen phlegm. 236 mL 0  . levETIRAcetam (KEPPRA) 100 MG/ML solution Place 10 mLs (1,000 mg total) into feeding tube 2 (two) times daily. 473 mL 12  . metoCLOPramide (REGLAN) 10 MG/10ML SOLN Place 5 mLs (5 mg total) into feeding tube 3 (three) times daily before meals. 1200 mL 0  . metoprolol tartrate (LOPRESSOR) 25 MG tablet Place 1 tablet (25 mg total) into feeding tube 2 (two) times daily.    . Multiple Vitamin (MULTIVITAMIN) LIQD Place 15 mLs into feeding tube daily.    . Nutritional Supplements (FEEDING SUPPLEMENT, OSMOLITE 1.5 CAL,) LIQD Place 1,000 mLs into feeding tube daily.  0  . Nutritional Supplements (FEEDING SUPPLEMENT, PROSOURCE TF,) liquid Place 45 mLs into feeding tube 2 (two) times daily.    Marland Kitchen oxyCODONE (OXY IR/ROXICODONE) 5 MG immediate release tablet Place 1 tablet (5 mg total) into feeding tube every 8 (eight) hours. 30 tablet 0  . [START ON 01/11/2021] polyethylene glycol (MIRALAX / GLYCOLAX) 17 g packet Place 17 g into feeding tube daily. 14 each 0  . potassium chloride SA (KLOR-CON) 20 MEQ tablet Take 1 tablet (20 mEq total) by mouth at bedtime.    Marland Kitchen QUEtiapine (SEROQUEL) 25 MG tablet Place 1 tablet (25 mg total) into feeding tube at bedtime.    Derrill Memo ON 01/11/2021] senna-docusate (SENOKOT-S) 8.6-50 MG tablet Place 1 tablet into feeding tube daily.    . Water For Irrigation, Sterile (FREE WATER) SOLN Place 30 mLs into feeding tube every 4 (four) hours.      Drug Regimen Review Drug regimen was reviewed and remains appropriate with no significant issues identified.  Home: Home  Living Family/patient expects to be discharged to:: Inpatient rehab Living Arrangements: Spouse/significant other,Children Available Help at Discharge: Family,Available 24 hours/day Type of Home: House Additional Comments: Pt's husband and grown children are very dedicated to her care. Pt was independent PTA.   Functional History: Prior Function Level of Independence: Independent Comments: Pt unable to provide PLOF; family to inform OT as able  Functional Status:  Mobility: Bed Mobility Overal bed mobility: Needs Assistance Bed Mobility: Supine to Sit,Sit to Supine Supine to sit: HOB elevated,Max assist Sit to supine: Total assist General bed mobility comments: Pt moving B LEs towards EOB for supine >sit Transfers Overall transfer level: Needs assistance Equipment used: Sliding board Transfers: Lateral/Scoot Transfers Sit to Stand: Max assist,+2 physical assistance,+2 safety/equipment  Lateral/Scoot Transfers: Max assist,+2 physical assistance General transfer comment: lateral scoot 1x to L along bed (used of bed sheet to assist); assist for balance d/t mild posterior lean Ambulation/Gait General Gait Details: not appropriate at this time    ADL: ADL Overall ADL's : Needs assistance/impaired Grooming: Wash/dry face,Sitting,Minimal assistance Grooming Details (indicate cue type and reason): min A to initiate and then pt  performs Upper Body Dressing : Maximal assistance Upper Body Dressing Details (indicate cue type and reason): changing gown- pushed L UE through sleeve  Cognition: Cognition Overall Cognitive Status: Difficult to assess Orientation Level: (P) Intubated/Tracheostomy - Unable to assess Cognition Arousal/Alertness: Awake/alert Behavior During Therapy: Flat affect Overall Cognitive Status: Difficult to assess General Comments: Pt remains non-verbal but continues to nod head yes/no and blows kiss to therapist at end of session Difficult to assess due to:  Tracheostomy  Physical Exam: Blood pressure (!) 131/96, pulse 87, temperature 98.4 F (36.9 C), resp. rate 18, height 5' 0.98" (1.549 m), weight 77.2 kg, SpO2 98 %. Physical Exam Vitals reviewed.  Constitutional:      General: She is not in acute distress.    Appearance: She is obese.  HENT:     Head: Normocephalic and atraumatic.     Right Ear: External ear normal.     Nose: Nose normal.  Eyes:     General:        Right eye: No discharge.        Left eye: No discharge.     Extraocular Movements: Extraocular movements intact.  Neck:     Comments: + Trach with trach collar Cardiovascular:     Rate and Rhythm: Normal rate and regular rhythm.  Pulmonary:     Effort: Pulmonary effort is normal. No respiratory distress.     Breath sounds: No stridor.  Abdominal:     General: Abdomen is flat. There is no distension.     Comments: + Gastrostomy tube in place  Musculoskeletal:     Comments: No edema or tenderness in extremities  Skin:    General: Skin is warm and dry.  Neurological:     Mental Status: She is alert.     Comments: Alert Makes eye contact with examiner.   Motor: Limited due to participation, however spontaneously moving extremities, lower extremities > upper extremities, with?  Increased right upper extremity weakness  Psychiatric:     Comments: Unable to assess due to cognition     Results for orders placed or performed during the hospital encounter of 12/02/20 (from the past 48 hour(s))  Glucose, capillary     Status: Abnormal   Collection Time: 01/08/21  4:29 PM  Result Value Ref Range   Glucose-Capillary 149 (H) 70 - 99 mg/dL    Comment: Glucose reference range applies only to samples taken after fasting for at least 8 hours.  Glucose, capillary     Status: Abnormal   Collection Time: 01/08/21  8:31 PM  Result Value Ref Range   Glucose-Capillary 130 (H) 70 - 99 mg/dL    Comment: Glucose reference range applies only to samples taken after fasting for at  least 8 hours.  Glucose, capillary     Status: Abnormal   Collection Time: 01/08/21 11:37 PM  Result Value Ref Range   Glucose-Capillary 118 (H) 70 - 99 mg/dL    Comment: Glucose reference range applies only to samples taken after fasting for at least 8 hours.  Glucose, capillary     Status: Abnormal   Collection Time: 01/09/21  4:24 AM  Result Value Ref Range   Glucose-Capillary 147 (H) 70 - 99 mg/dL    Comment: Glucose reference range applies only to samples taken after fasting for at least 8 hours.  Magnesium     Status: None   Collection Time: 01/09/21  5:51 AM  Result Value Ref Range   Magnesium 1.9 1.7 - 2.4  mg/dL    Comment: Performed at [facility], [address] [place], [place] [address]  Phosphorus     Status: None   Collection Time: 01/09/21  5:51 AM  Result Value Ref Range   Phosphorus 3.6 2.5 - 4.6 mg/dL    Comment: Performed at [facility], [address] [place], [place] [address]  CBC     Status: Abnormal   Collection Time: 01/09/21  5:51 AM  Result Value Ref Range   WBC 7.6 4.0 - 10.5 K/uL   RBC 4.13 3.87 - 5.11 MIL/uL   Hemoglobin 11.0 (L) 12.0 - 15.0 g/dL   HCT 35.4 (L) 36.0 - 46.0 %   MCV 85.7 80.0 - 100.0 fL   MCH 26.6 26.0 - 34.0 pg   MCHC 31.1 30.0 - 36.0 g/dL   RDW 19.6 (H) 11.5 - 15.5 %   Platelets 228 150 - 400 K/uL   nRBC 0.0 0.0 - 0.2 %    Comment: Performed at [facility], [address] [place], [place] [address]  Basic metabolic panel     Status: Abnormal   Collection Time: 01/09/21  5:51 AM  Result Value Ref Range   Sodium 144 135 - 145 mmol/L   Potassium 3.1 (L) 3.5 - 5.1 mmol/L   Chloride 106 98 - 111 mmol/L   CO2 27 22 - 32 mmol/L   Glucose, Bld 162 (H) 70 - 99 mg/dL    Comment: Glucose reference range applies only to samples taken after fasting for at least 8 hours.   BUN 9 6 - 20 mg/dL   Creatinine, Ser 0.48 0.44 - 1.00 mg/dL   Calcium 9.1 8.9 - 10.3 mg/dL   GFR, Estimated >60 >60 mL/min     Comment: (NOTE) Calculated using the CKD-EPI Creatinine Equation (2021)    Anion gap 11 5 - 15    Comment: Performed at [facility], [address] [place], [place] [address]  TSH     Status: None   Collection Time: 01/09/21  5:51 AM  Result Value Ref Range   TSH 1.241 0.350 - 4.500 uIU/mL    Comment: Performed by a 3rd Generation assay with a functional sensitivity of <=0.01 uIU/mL. Performed at [facility], [address] [place],  [address]   Glucose, capillary     Status: Abnormal   Collection Time: 01/09/21  7:24 AM  Result Value Ref Range   Glucose-Capillary 147 (H) 70 - 99 mg/dL    Comment: Glucose reference range applies only to samples taken after fasting for at least 8 hours.  Glucose, capillary     Status: Abnormal   Collection Time: 01/09/21 11:36 AM  Result Value Ref Range   Glucose-Capillary 138 (H) 70 - 99 mg/dL    Comment: Glucose reference range applies only to samples taken after fasting for at least 8 hours.  Glucose, capillary     Status: Abnormal   Collection Time: 01/09/21  4:00 PM  Result Value Ref Range   Glucose-Capillary 158 (H) 70 - 99 mg/dL    Comment: Glucose reference range applies only to samples taken after fasting for at least 8 hours.  Glucose, capillary     Status: Abnormal   Collection Time: 01/09/21  7:45 PM  Result Value Ref Range   Glucose-Capillary 140 (H) 70 - 99 mg/dL    Comment: Glucose reference range applies only to samples taken after fasting for at least 8 hours.  Glucose, capillary  Status: Abnormal   Collection Time: 01/10/21 12:06 AM  Result Value Ref Range   Glucose-Capillary 163 (H) 70 - 99 mg/dL    Comment: Glucose reference range applies only to samples taken after fasting for at least 8 hours.  Glucose, capillary     Status: Abnormal   Collection Time: 01/10/21  3:52 AM  Result Value Ref Range   Glucose-Capillary 152 (H) 70 - 99 mg/dL    Comment: Glucose reference range applies only to  samples taken after fasting for at least 8 hours.  Basic metabolic panel     Status: Abnormal   Collection Time: 01/10/21  5:53 AM  Result Value Ref Range   Sodium 142 135 - 145 mmol/L   Potassium 3.1 (L) 3.5 - 5.1 mmol/L   Chloride 104 98 - 111 mmol/L   CO2 27 22 - 32 mmol/L   Glucose, Bld 148 (H) 70 - 99 mg/dL    Comment: Glucose reference range applies only to samples taken after fasting for at least 8 hours.   BUN 10 6 - 20 mg/dL   Creatinine, Ser 0.42 (L) 0.44 - 1.00 mg/dL   Calcium 9.3 8.9 - 10.3 mg/dL   GFR, Estimated >60 >60 mL/min    Comment: (NOTE) Calculated using the CKD-EPI Creatinine Equation (2021)    Anion gap 11 5 - 15    Comment: Performed at [facility], [address] [place], [place] [address]  Glucose, capillary     Status: Abnormal   Collection Time: 01/10/21  8:03 AM  Result Value Ref Range   Glucose-Capillary 151 (H) 70 - 99 mg/dL    Comment: Glucose reference range applies only to samples taken after fasting for at least 8 hours.  Glucose, capillary     Status: Abnormal   Collection Time: 01/10/21 12:33 PM  Result Value Ref Range   Glucose-Capillary 159 (H) 70 - 99 mg/dL    Comment: Glucose reference range applies only to samples taken after fasting for at least 8 hours.   DG Abd 1 View  Result Date: 01/09/2021 CLINICAL DATA:  Vomiting. EXAM: ABDOMEN - 1 VIEW COMPARISON:  Abdominal x-ray dated December 31, 2020. FINDINGS: Gastrostomy tube again noted. The bowel gas pattern is normal. No radio-opaque calculi or other significant radiographic abnormality are seen. No acute osseous abnormality. IMPRESSION: Negative. Electronically Signed   By: Titus Dubin M.D.   On: 01/09/2021 15:40       Medical Problem List and Plan: 1.  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  Antithrombotics: -DVT/anticoagulation:  SCDs  -antiplatelet therapy: N/A 3. Pain Management: Oxycodone as needed 4. Mood: Klonopin 0.5 mg twice daily  -antipsychotic agents: Seroquel 25 mg nightly   This medication is necessary. 5. Neuropsych: This patient is not capable of making decisions on her own behalf. 6. Skin/Wound Care: Routine skin checks 7. Fluids/Electrolytes/Nutrition: Routine in and outs.  CMP ordered 8.  Seizure prophylaxis.  Keppra 1000 mg twice daily.  EEG negative 9.  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  We will plan to decannulate if tolerated. 10.  Dysphagia with decreased nutritional storage.  Gastrostomy tube 12/13/2020 per Dr. Andrey Farmer.  Dietary follow-up.  Advance diet as tolerated 11.  Aspiration pneumonia.  5-day course of Unasyn completed. 12.  Hypertension.  Norvasc 10 mg daily, Lopressor 25 mg twice daily.  Patient would follow-up cardiology services.  Monitor with increased mobility 13.  Hyperlipidemia: Lipitor 14.  Morbid obesity.  BMI 31.47.  Dietary follow-up 15.  Prediabetes.  Hemoglobin A1c 5.9.  CBGs monitored while on tube feeds  Moderate increased  Cathlyn Parsons, PA-C 01/10/2021  I have personally performed a face to face diagnostic evaluation, including, but not limited to relevant history and physical exam findings, of this patient and developed relevant assessment and plan.  Additionally, I have reviewed and concur with the physician assistant's documentation above.  Delice Lesch, MD, ABPMR

## 2021-01-09 NOTE — Progress Notes (Signed)
Inpatient Rehab Admissions Coordinator:   Note possibility of cardiology consult.  Will await results before potentially proceeding with CIR admission. Would not be able to admit today.   Shann Medal, PT, DPT Admissions Coordinator [phone] 01/09/21  11:38 AM

## 2021-01-10 ENCOUNTER — Inpatient Hospital Stay (HOSPITAL_COMMUNITY)
Admission: RE | Admit: 2021-01-10 | Discharge: 2021-02-08 | DRG: 945 | Disposition: A | Discharge status: Home / Self Care | Payer: Self-pay | Source: Other Acute Inpatient Hospital | Attending: Physical Medicine & Rehabilitation | Admitting: Physical Medicine & Rehabilitation

## 2021-01-10 ENCOUNTER — Encounter (HOSPITAL_COMMUNITY): Payer: Self-pay | Admitting: Physical Medicine & Rehabilitation

## 2021-01-10 ENCOUNTER — Other Ambulatory Visit: Payer: Self-pay

## 2021-01-10 DIAGNOSIS — Z79899 Other long term (current) drug therapy: Secondary | ICD-10-CM

## 2021-01-10 DIAGNOSIS — Z87891 Personal history of nicotine dependence: Secondary | ICD-10-CM

## 2021-01-10 DIAGNOSIS — R7303 Prediabetes: Secondary | ICD-10-CM

## 2021-01-10 DIAGNOSIS — E785 Hyperlipidemia, unspecified: Secondary | ICD-10-CM

## 2021-01-10 DIAGNOSIS — R7309 Other abnormal glucose: Secondary | ICD-10-CM

## 2021-01-10 DIAGNOSIS — Z930 Tracheostomy status: Secondary | ICD-10-CM

## 2021-01-10 DIAGNOSIS — Z298 Encounter for other specified prophylactic measures: Secondary | ICD-10-CM

## 2021-01-10 DIAGNOSIS — I10 Essential (primary) hypertension: Secondary | ICD-10-CM | POA: Diagnosis present

## 2021-01-10 DIAGNOSIS — R000 Tachycardia, unspecified: Secondary | ICD-10-CM | POA: Diagnosis present

## 2021-01-10 DIAGNOSIS — Z6831 Body mass index (BMI) 31.0-31.9, adult: Secondary | ICD-10-CM

## 2021-01-10 DIAGNOSIS — Z931 Gastrostomy status: Secondary | ICD-10-CM

## 2021-01-10 DIAGNOSIS — E440 Moderate protein-calorie malnutrition: Secondary | ICD-10-CM | POA: Diagnosis present

## 2021-01-10 DIAGNOSIS — R5381 Other malaise: Principal | ICD-10-CM | POA: Diagnosis present

## 2021-01-10 DIAGNOSIS — G931 Anoxic brain damage, not elsewhere classified: Secondary | ICD-10-CM | POA: Diagnosis present

## 2021-01-10 DIAGNOSIS — R1310 Dysphagia, unspecified: Secondary | ICD-10-CM | POA: Diagnosis present

## 2021-01-10 DIAGNOSIS — E162 Hypoglycemia, unspecified: Secondary | ICD-10-CM | POA: Diagnosis not present

## 2021-01-10 DIAGNOSIS — R32 Unspecified urinary incontinence: Secondary | ICD-10-CM | POA: Diagnosis present

## 2021-01-10 DIAGNOSIS — D649 Anemia, unspecified: Secondary | ICD-10-CM | POA: Diagnosis present

## 2021-01-10 LAB — GLUCOSE, CAPILLARY
Glucose-Capillary: 122 mg/dL — ABNORMAL HIGH (ref 70–99)
Glucose-Capillary: 141 mg/dL — ABNORMAL HIGH (ref 70–99)
Glucose-Capillary: 151 mg/dL — ABNORMAL HIGH (ref 70–99)
Glucose-Capillary: 152 mg/dL — ABNORMAL HIGH (ref 70–99)
Glucose-Capillary: 159 mg/dL — ABNORMAL HIGH (ref 70–99)
Glucose-Capillary: 163 mg/dL — ABNORMAL HIGH (ref 70–99)
Glucose-Capillary: 180 mg/dL — ABNORMAL HIGH (ref 70–99)

## 2021-01-10 LAB — BASIC METABOLIC PANEL
Anion gap: 11 (ref 5–15)
BUN: 10 mg/dL (ref 6–20)
CO2: 27 mmol/L (ref 22–32)
Calcium: 9.3 mg/dL (ref 8.9–10.3)
Chloride: 104 mmol/L (ref 98–111)
Creatinine, Ser: 0.42 mg/dL — ABNORMAL LOW (ref 0.44–1.00)
GFR, Estimated: >60 mL/min (ref >60)
Glucose, Bld: 148 mg/dL — ABNORMAL HIGH (ref 70–99)
Potassium: 3.1 mmol/L — ABNORMAL LOW (ref 3.5–5.1)
Sodium: 142 mmol/L (ref 135–145)

## 2021-01-10 MED ORDER — METOCLOPRAMIDE HCL 10 MG/10ML PO SOLN
5.0000 mg | Freq: Three times a day (TID) | ORAL | Status: DC
  Filled 2021-01-10: qty 5

## 2021-01-10 MED ORDER — OSMOLITE 1.5 CAL PO LIQD
1000.0000 mL | ORAL | Status: DC
  Administered 2021-01-10 – 2021-01-15 (×4): 1000 mL

## 2021-01-10 MED ORDER — POTASSIUM CHLORIDE 20 MEQ PO PACK
40.0000 meq | PACK | Freq: Once | ORAL | Status: AC
  Administered 2021-01-10: 40 meq via ORAL
  Filled 2021-01-10: qty 2

## 2021-01-10 MED ORDER — GUAIFENESIN 100 MG/5ML PO SOLN: 200.0000 mg | ORAL | 0 refills | Status: DC | PRN

## 2021-01-10 MED ORDER — OSMOLITE 1.5 CAL PO LIQD: 1000.0000 mL | ORAL | Status: DC

## 2021-01-10 MED ORDER — PROSOURCE TF PO LIQD: 45.0000 mL | Freq: Two times a day (BID) | ORAL | Status: DC

## 2021-01-10 MED ORDER — PROSOURCE TF PO LIQD
45.0000 mL | Freq: Two times a day (BID) | ORAL | Status: DC
  Administered 2021-01-10 – 2021-01-24 (×28): 45 mL
  Filled 2021-01-10 (×28): qty 45

## 2021-01-10 MED ORDER — OSMOLITE 1.5 CAL PO LIQD: 1000.0000 mL | ORAL | 0 refills | Status: DC

## 2021-01-10 MED ORDER — ADULT MULTIVITAMIN LIQUID CH: 15.0000 mL | Freq: Every day | ORAL | Status: DC

## 2021-01-10 MED ORDER — AMLODIPINE BESYLATE 10 MG PO TABS: 10.0000 mg | ORAL_TABLET | Freq: Every day | ORAL | Status: DC

## 2021-01-10 MED ORDER — LEVETIRACETAM 100 MG/ML PO SOLN: 1000.0000 mg | Freq: Two times a day (BID) | ORAL | 12 refills | Status: DC

## 2021-01-10 MED ORDER — METOPROLOL TARTRATE 25 MG PO TABS
25.0000 mg | ORAL_TABLET | Freq: Two times a day (BID) | ORAL | Status: DC
  Administered 2021-01-10 – 2021-02-04 (×50): 25 mg
  Filled 2021-01-10 (×50): qty 1

## 2021-01-10 MED ORDER — OSMOLITE 1.5 CAL PO LIQD
1000.0000 mL | ORAL | Status: DC
  Administered 2021-01-10: 1000 mL

## 2021-01-10 MED ORDER — METOCLOPRAMIDE HCL 10 MG/10ML PO SOLN: 5.0000 mg | Freq: Three times a day (TID) | ORAL | 0 refills | Status: DC

## 2021-01-10 MED ORDER — CLONAZEPAM 0.5 MG PO TABS
0.5000 mg | ORAL_TABLET | Freq: Two times a day (BID) | ORAL | Status: DC
  Administered 2021-01-10 – 2021-01-16 (×12): 0.5 mg
  Filled 2021-01-10 (×12): qty 1

## 2021-01-10 MED ORDER — LEVETIRACETAM 100 MG/ML PO SOLN
1000.0000 mg | Freq: Two times a day (BID) | ORAL | Status: DC
  Administered 2021-01-10 – 2021-02-01 (×44): 1000 mg
  Filled 2021-01-10 (×45): qty 10

## 2021-01-10 MED ORDER — BISACODYL 10 MG RE SUPP: 10.0000 mg | Freq: Every day | RECTAL | 0 refills | Status: DC | PRN

## 2021-01-10 MED ORDER — ACETAMINOPHEN 325 MG PO TABS
650.0000 mg | ORAL_TABLET | Freq: Three times a day (TID) | ORAL | Status: DC | PRN
  Administered 2021-01-12 – 2021-01-19 (×2): 650 mg
  Filled 2021-01-10 (×2): qty 2

## 2021-01-10 MED ORDER — FREE WATER
30.0000 mL | Status: DC
  Administered 2021-01-10 – 2021-01-16 (×32): 30 mL

## 2021-01-10 MED ORDER — ADULT MULTIVITAMIN W/MINERALS CH
1.0000 | ORAL_TABLET | Freq: Every day | ORAL | Status: DC
  Administered 2021-01-11 – 2021-02-08 (×28): 1
  Filled 2021-01-10 (×28): qty 1

## 2021-01-10 MED ORDER — QUETIAPINE FUMARATE 25 MG PO TABS
25.0000 mg | ORAL_TABLET | Freq: Every day | ORAL | Status: DC
  Administered 2021-01-10 – 2021-02-02 (×24): 25 mg
  Filled 2021-01-10 (×25): qty 1

## 2021-01-10 MED ORDER — METOPROLOL TARTRATE 25 MG PO TABS: 25.0000 mg | ORAL_TABLET | Freq: Two times a day (BID) | ORAL | Status: DC

## 2021-01-10 MED ORDER — POLYETHYLENE GLYCOL 3350 17 G PO PACK
17.0000 g | PACK | Freq: Every day | ORAL | Status: DC
  Administered 2021-01-11 – 2021-01-16 (×6): 17 g
  Filled 2021-01-10 (×6): qty 1

## 2021-01-10 MED ORDER — POTASSIUM CHLORIDE CRYS ER 20 MEQ PO TBCR: 20.0000 meq | EXTENDED_RELEASE_TABLET | Freq: Every day | ORAL | Status: DC

## 2021-01-10 MED ORDER — CLONAZEPAM 0.5 MG PO TABS: 0.5000 mg | ORAL_TABLET | Freq: Two times a day (BID) | ORAL | 0 refills | Status: DC

## 2021-01-10 MED ORDER — ATORVASTATIN CALCIUM 80 MG PO TABS
80.0000 mg | ORAL_TABLET | Freq: Every day | ORAL | Status: DC
  Administered 2021-01-11 – 2021-02-04 (×25): 80 mg
  Filled 2021-01-10 (×25): qty 1

## 2021-01-10 MED ORDER — AMLODIPINE BESYLATE 10 MG PO TABS
10.0000 mg | ORAL_TABLET | Freq: Every day | ORAL | Status: DC
  Administered 2021-01-11 – 2021-02-04 (×25): 10 mg
  Filled 2021-01-10 (×25): qty 1

## 2021-01-10 MED ORDER — ATORVASTATIN CALCIUM 80 MG PO TABS: 80.0000 mg | ORAL_TABLET | Freq: Every day | ORAL | Status: DC

## 2021-01-10 MED ORDER — SENNOSIDES-DOCUSATE SODIUM 8.6-50 MG PO TABS
1.0000 | ORAL_TABLET | Freq: Every day | ORAL | Status: DC
  Administered 2021-01-11 – 2021-01-16 (×6): 1
  Filled 2021-01-10 (×6): qty 1

## 2021-01-10 MED ORDER — QUETIAPINE FUMARATE 25 MG PO TABS: 25.0000 mg | ORAL_TABLET | Freq: Every day | ORAL | Status: DC

## 2021-01-10 MED ORDER — SENNOSIDES-DOCUSATE SODIUM 8.6-50 MG PO TABS: 1.0000 | ORAL_TABLET | Freq: Every day | ORAL | Status: DC

## 2021-01-10 MED ORDER — OXYCODONE HCL 5 MG PO TABS
5.0000 mg | ORAL_TABLET | Freq: Three times a day (TID) | ORAL | Status: DC
  Administered 2021-01-10 – 2021-01-11 (×3): 5 mg
  Filled 2021-01-10 (×3): qty 1

## 2021-01-10 MED ORDER — ACETAMINOPHEN 325 MG PO TABS: 650.0000 mg | ORAL_TABLET | Freq: Three times a day (TID) | ORAL | Status: DC | PRN

## 2021-01-10 MED ORDER — POLYETHYLENE GLYCOL 3350 17 G PO PACK: 17.0000 g | PACK | Freq: Every day | ORAL | 0 refills | Status: DC

## 2021-01-10 MED ORDER — GUAIFENESIN 100 MG/5ML PO SOLN
200.0000 mg | ORAL | Status: DC | PRN
  Administered 2021-01-10: 200 mg
  Filled 2021-01-10: qty 5

## 2021-01-10 MED ORDER — FREE WATER: 30.0000 mL | Status: DC

## 2021-01-10 MED ORDER — BISACODYL 10 MG RE SUPP: 10.0000 mg | Freq: Every day | RECTAL | Status: DC | PRN

## 2021-01-10 MED ORDER — METOCLOPRAMIDE HCL 5 MG/5ML PO SOLN
5.0000 mg | Freq: Three times a day (TID) | ORAL | Status: DC
  Administered 2021-01-10 – 2021-01-14 (×10): 5 mg
  Filled 2021-01-10 (×12): qty 10

## 2021-01-10 MED ORDER — OXYCODONE HCL 5 MG PO TABS: 5.0000 mg | ORAL_TABLET | Freq: Three times a day (TID) | ORAL | 0 refills | Status: DC

## 2021-01-10 MED ORDER — POTASSIUM CHLORIDE CRYS ER 20 MEQ PO TBCR
20.0000 meq | EXTENDED_RELEASE_TABLET | Freq: Every day | ORAL | Status: DC
  Administered 2021-01-10 – 2021-01-11 (×2): 20 meq via ORAL
  Filled 2021-01-10 (×2): qty 1

## 2021-01-10 NOTE — Progress Notes (Signed)
Inpatient Rehab Admissions Coordinator:    I have a bed available for pt to admit to CIR today. Dr. Loleta Books in agreement.  Will let pt/family and TOC team know.  Will arrange transport with [facility] once CIR room is ready.   Shann Medal, PT, DPT Admissions Coordinator [phone] 01/10/21  10:10 AM

## 2021-01-10 NOTE — Progress Notes (Signed)
Occupational Therapy Treatment Patient Details Name: [name] [name] MRN: [id number] DOB: 04/28/1966 Today's Date: 01/10/2021    History of present illness Pt is a 55 year old female presented to the ED via EMS post witnessed cardiac arrest. ROSC achieved following 3 defibrillations and one round of epinephrine. Patient found to have significant hypokalemia on arrival. Given witnessed arrest, patient Code ICE.  S/p trach and peg tube placement.  Workup showed anoxic brain injury. Pt also showed facial twitching that could be myoclonic versus partial seizure.   OT comments  Upon entering the room, pt supine in bed with RN present in room. No signs of pain during this session. Pt appeared restless in bed and moving LEs up and down very frequently. OT noted pt's brief to be wet. Pt able to bridge through B LEs for therapist to removed soil brief and assist with hygiene. Pt following commands throughout session with increased time and mod multimodal cuing. Pt needing hand over hand assist to initiate washing face but then able to complete tasks with use of L UE. Pt rolling L <> R with max A for RN to perform skin checks. Pt bringing B LEs off EOB with max A for supine >sit. Pt initially needing mod A for static sitting balance progressing to min guard and weight bearing through B LEs with positioning. RN giving medication through PEG while seated. Pt maintained seated position for 15 minutes before returning to bed with total A for repositioning. Pt blowing therapist a kiss at the end of session. All needs within reach and RN remains present. Bed alarm activated. Pt continues to be appropriate for OT intervention and would benefit from intensive rehab to address functional deficits.   Follow Up Recommendations  CIR    Equipment Recommendations  Other (comment)       Precautions / Restrictions Precautions Precautions: Fall Precaution Comments: Trach collar 5 L; PEG tube       Mobility Bed  Mobility Overal bed mobility: Needs Assistance Bed Mobility: Supine to Sit;Sit to Supine     Supine to sit: HOB elevated;Max assist Sit to supine: Total assist   General bed mobility comments: Pt moving B LEs towards EOB for supine >sit           ADL either performed or assessed with clinical judgement   ADL Overall ADL's : Needs assistance/impaired     Grooming: Wash/dry face;Sitting;Minimal assistance Grooming Details (indicate cue type and reason): min A to initiate and then pt performs                                     Vision Patient Visual Report: No change from baseline            Cognition Arousal/Alertness: Awake/alert Behavior During Therapy: Flat affect Overall Cognitive Status: Difficult to assess                                 General Comments: Pt remains non-verbal but continues to nod head yes/no and blows kiss to therapist at end of session                   Pertinent Vitals/ Pain       Pain Assessment: Faces Faces Pain Scale: No hurt         Frequency  Min 3X/week  Progress Toward Goals  OT Goals(current goals can now be found in the care plan section)  Progress towards OT goals: Progressing toward goals  Acute Rehab OT Goals Patient Stated Goal: none stated OT Goal Formulation: Patient unable to participate in goal setting Time For Goal Achievement: 01/09/21 Potential to Achieve Goals: [place] Discharge plan remains appropriate       AM-PAC OT "6 Clicks" Daily Activity     Outcome Measure   Help from another person eating meals?: Total Help from another person taking care of personal grooming?: A Lot Help from another person toileting, which includes using toliet, bedpan, or urinal?: Total Help from another person bathing (including washing, rinsing, drying)?: A Lot Help from another person to put on and taking off regular upper body clothing?: Total Help from another person to  put on and taking off regular lower body clothing?: Total 6 Click Score: 8    End of Session Equipment Utilized During Treatment:  (trach 5 Ls)  OT Visit Diagnosis: Unsteadiness on feet (R26.81);Muscle weakness (generalized) (M62.81)   Activity Tolerance Patient tolerated treatment well   Patient Left in bed;with call bell/phone within reach;with bed alarm set;with nursing/sitter in room   Nurse Communication Mobility status;Precautions        Time: 1020-1059 OT Time Calculation (min): 39 min  Charges: OT General Charges $OT Visit: 1 Visit OT Treatments $Self Care/Home Management : 8-22 mins $Neuromuscular Re-education: 23-37 mins  Darleen Crocker, MS, OTR/L , CBIS ascom [phone]  01/10/21, 1:33 PM

## 2021-01-10 NOTE — Progress Notes (Signed)
[facility] ([facility]) Community Based Palliative Care       This patient has been referred to our palliative care services in the community.  [facility] will continue to follow for any discharge planning needs and to coordinate admission onto palliative care.    Thank you for the opportunity to participate in this patient's care.     Chrislyn King, BSN, RN [facility] Hospital Liaison   [phone] (24h on call) 

## 2021-01-10 NOTE — Progress Notes (Signed)
Called report to IP rehab.  Spoke to [place].   Gave report

## 2021-01-10 NOTE — Discharge Summary (Signed)
Physician Discharge Summary  [name] [name]:[id number] DOB: [birth year] DOA: 12/02/2020  PCP: Marinda Elk, MD  Admit date: 12/02/2020 Discharge date: 01/10/2021  Admitted From: Home  Disposition:  [facility]   Recommendations for Outpatient Follow-up:  1. Set patient up with post-discharge tracheostomy clinic with Dr. Clyde Canterbury 2. Repeat K in 1 week      Home Health: N/A  Equipment/Devices: TBD at CIR  Discharge Condition: Improving but guarded  CODE STATUS: Partial (may intubate, defibrillate, do not do CPR) Diet recommendation: Per tube  Brief/Interim Summary: [name] [name] is a 55 y.o. F with hx HTN, pre-diabetes, and obesity who presented after unwitnessed cardiac arrest.  Last seen by family 1 hr prior to being found down at home.  Vfib on arrival of EMS.  One shock/epi and ROSC in field.     1/10 admitted and intubated --> hypothermia started 1/11 rewarmed; echocardiogram normal EF 1/21 PEG tube placement and trach placement 2/7 transferred [place] 2/13 concern for aspiration pneumonia after low grade fever and aspiration event        PRINCIPAL HOSPITAL DIAGNOSIS: Ventricular fibrillation    Discharge Diagnoses:   V. fib arrest Patient with unknown downtime, Vfib on arrival of EMS. Regained spontaneous circulation after 1 shock and epi. S/p hypothermia on admission.  Echocardiogram normal, evaluated by Cardiology who felt that inpatient angiographic/ischemic evaluation should be deferred to the outpatient setting given her current cognitive and functional status.    She was started on atorvastatin and metoprolol.     Acute respiratory failure with hypoxia and hypercapnia due to cardiac arrest Anoxic brain injury Status post tracheostomy and PEG tube placement Continue Seroquel and Keppra   Continue Klonopin, would recommend this be weaned as able    Aspiration pneumonia bilateral lower lobes Patient had an aspiration  event 1 week ago.  She developed a low grade fever after and was started on Unasyn.  Completed Unasyn 5 days.  No further fever, weaned to room air.  She was started on Reglan to prevent vomiting.  Until patient is consistently tolerating full tube feeds (50cc/hr Osmo 1.5), would maintain cuffed trach.     Swallowing dysfunction Continue Osmolite 1.5, goal rate 50cc/hr Prosource 45 BID And Free water 30 cc q4hrs  Multivitamin     Prediabetes Diabetes and DKA ruled out  Hypertension Continue amlodipine and metoprolol  Anemia   Hypokalemia Supplement K, repeat K level in 1 week          Discharge Instructions   Allergies as of 01/10/2021   No Known Allergies     Medication List    TAKE these medications   acetaminophen 325 MG tablet Commonly known as: TYLENOL Place 2 tablets (650 mg total) into feeding tube every 8 (eight) hours as needed for mild pain or fever.   amLODipine 10 MG tablet Commonly known as: NORVASC Place 1 tablet (10 mg total) into feeding tube daily. Start taking on: January 11, 2021   atorvastatin 80 MG tablet Commonly known as: LIPITOR Place 1 tablet (80 mg total) into feeding tube daily. Start taking on: January 11, 2021   bisacodyl 10 MG suppository Commonly known as: DULCOLAX Place 1 suppository (10 mg total) rectally daily as needed for moderate constipation.   clonazePAM 0.5 MG tablet Commonly known as: KLONOPIN Place 1 tablet (0.5 mg total) into feeding tube 2 (two) times daily.   feeding supplement (OSMOLITE 1.5 CAL) Liqd Place 1,000 mLs into feeding tube daily.   feeding  supplement (PROSource TF) liquid Place 45 mLs into feeding tube 2 (two) times daily.   free water Soln Place 30 mLs into feeding tube every 4 (four) hours.   guaiFENesin 100 MG/5ML Soln Commonly known as: ROBITUSSIN Place 10 mLs (200 mg total) into feeding tube every 4 (four) hours as needed for cough or to loosen phlegm.   levETIRAcetam  100 MG/ML solution Commonly known as: KEPPRA Place 10 mLs (1,000 mg total) into feeding tube 2 (two) times daily.   metoCLOPramide 10 MG/10ML Soln Commonly known as: REGLAN Place 5 mLs (5 mg total) into feeding tube 3 (three) times daily before meals.   metoprolol tartrate 25 MG tablet Commonly known as: LOPRESSOR Place 1 tablet (25 mg total) into feeding tube 2 (two) times daily.   multivitamin Liqd Place 15 mLs into feeding tube daily.   oxyCODONE 5 MG immediate release tablet Commonly known as: Oxy IR/ROXICODONE Place 1 tablet (5 mg total) into feeding tube every 8 (eight) hours.   polyethylene glycol 17 g packet Commonly known as: MIRALAX / GLYCOLAX Place 17 g into feeding tube daily. Start taking on: January 11, 2021   potassium chloride SA 20 MEQ tablet Commonly known as: KLOR-CON Take 1 tablet (20 mEq total) by mouth at bedtime.   QUEtiapine 25 MG tablet Commonly known as: SEROQUEL Place 1 tablet (25 mg total) into feeding tube at bedtime.   senna-docusate 8.6-50 MG tablet Commonly known as: Senokot-S Place 1 tablet into feeding tube daily. Start taking on: January 11, 2021       Follow-up Information    Clyde Canterbury, MD Follow up.   Specialty: Otolaryngology Contact information: [address] [place] [phone]              No Known Allergies  Consultations:  Cardiology  Neurology  GI  Pallaitive Care  General Surgery    Procedures/Studies: EEG  Result Date: 12/30/2020 Lora Havens, MD     12/30/2020  4:28 PM Patient Name: [name] [name] MRN: [id number] Epilepsy Attending: Lora Havens Referring Physician/Provider: Dr. Lesleigh Noe Date: 12/30/2020 Duration: 24.12 mins Patient history: 55 year old female status post cardiac arrest with an episode of facial twitching.  EEG to evaluate for seizures. Level of alertness: Awake AEDs during EEG study: Keppra, Klonopin Technical aspects: This EEG  study was done with scalp electrodes positioned according to the 10-20 International system of electrode placement. Electrical activity was acquired at a sampling rate of 500Hz  and reviewed with a high frequency filter of 70Hz  and a low frequency filter of 1Hz . EEG data were recorded continuously and digitally stored. Description: No posterior dominant rhythm was seen.  EEG showed continuous generalized 3 to 6 Hz theta-delta slowing admixed with 15-18hz  generalized beta activity. Physiologic photic driving was not seen during photic stimulation. Hyperventilation was not performed.   IMPRESSION: This study issuggestive of moderate diffuse encephalopathy, nonspecific etiology. No seizures and epileptiform discharges were seen throughout the recording. Lora Havens   EEG  Result Date: 12/23/2020 Lora Havens, MD     12/23/2020  2:42 PM Patient Name: LUWANNA BROSSMAN MRN: [id number] Epilepsy Attending: Lora Havens Referring Physician/Provider: Dr Roland Rack Date: 12/23/2020 Duration: 26.33 mins  Patient history: 55 year old female status post cardiac arrest.  EEG to evaluate for seizures  Level of alertness: awake, asleep  AEDs during EEG study: Clonazepam, LEV  Technical aspects: This EEG study was done with scalp electrodes positioned according to the 10-20 International  system of electrode placement. Electrical activity was acquired at a sampling rate of 500Hz  and reviewed with a high frequency filter of 70Hz  and a low frequency filter of 1Hz . EEG data were recorded continuously and digitally stored.  Description: No posterior dominant rhythm was seen. Sleep was characterized by vertex waves, sleep spindles (12-14hz ), maximal frontocentral region. EEG showed continuous generalized 3 to 6 Hz theta-delta slowing admixed with 15-18hz  generalized beta activity. Sharp transients were seen in bilateral frontocentral region only during sleep and are most likely atypical vertex waves.   Hyperventilation and photic stimulation were not performed.   IMPRESSION: This study is suggestive of moderate diffuse encephalopathy, nonspecific etiology. Sharp transients were seen in bilateral frontocentral region only during sleep and are most likely atypical vertex waves. No seizures were seen throughout the recording. If concern for ictal- interictal activity persists, please consider repeat study.  Lora Havens   DG Chest 1 View  Result Date: 01/05/2021 CLINICAL DATA:  Recent cardiac arrest with tracheostomy in place EXAM: CHEST  1 VIEW COMPARISON:  December 20, 2020 FINDINGS: Tracheostomy catheter tip is 4.5 cm above the carina. No pneumothorax. There is airspace opacity in portions of the left mid and lower lung region. Lungs elsewhere clear. Heart is upper normal in size with pulmonary vascularity normal. No adenopathy. No bone lesions. IMPRESSION: Ill-defined opacity in portions of the left mid and lower lung regions. Differential considerations include pneumonia and aspiration for this appearance. Lungs elsewhere clear. Heart upper normal in size. Tracheostomy present. No pneumothorax. Electronically Signed   By: Lowella Grip III M.D.   On: 01/05/2021 12:06   DG Abd 1 View  Result Date: 01/09/2021 CLINICAL DATA:  Vomiting. EXAM: ABDOMEN - 1 VIEW COMPARISON:  Abdominal x-ray dated December 31, 2020. FINDINGS: Gastrostomy tube again noted. The bowel gas pattern is normal. No radio-opaque calculi or other significant radiographic abnormality are seen. No acute osseous abnormality. IMPRESSION: Negative. Electronically Signed   By: Titus Dubin M.D.   On: 01/09/2021 15:40   DG Abd 1 View  Result Date: 12/31/2020 CLINICAL DATA:  Peg tube placement EXAM: ABDOMEN - 1 VIEW COMPARISON:  None. FINDINGS: Two frontal views of the lower chest and upper abdomen demonstrate contrast material injected through indwelling PEG tube outlining the gastric rugal folds. No evidence of contrast  extravasation. Bowel gas pattern is unremarkable. IMPRESSION: 1. Peg tube within the gastric lumen as above. Electronically Signed   By: Randa Ngo M.D.   On: 12/31/2020 21:53   MR BRAIN WO CONTRAST  Result Date: 12/22/2020 CLINICAL DATA:  Anoxic brain injury following cardiac arrest. EXAM: MRI HEAD WITHOUT AND WITH CONTRAST TECHNIQUE: Multiplanar, multiecho pulse sequences of the brain and surrounding structures were obtained without and with intravenous contrast. CONTRAST:  53mL GADAVIST GADOBUTROL 1 MMOL/ML IV SOLN COMPARISON:  MRI head 12/07/2020 FINDINGS: Brain: Progressive restricted diffusion in the globus pallidus bilaterally. Diffusion signal in the caudate and putamen improved. There is progressive FLAIR edema in the caudate and putamen compared to the prior study. Previously noted restricted diffusion throughout the cerebral cortex shows interval improvement however there remains mild cortical edema in the frontal parietal and temporal lobes bilaterally. Ventricle size normal. Negative for hemorrhage or mass. Normal enhancement following contrast administration. Vascular: Normal arterial flow voids. Skull and upper cervical spine: 3 cm sclerotic lesion in the right anterior parietal bone as noted on CT. No other skull lesion identified. Sinuses/Orbits: Mild mucosal edema paranasal sinuses. Bilateral mastoid effusion. Negative orbit Other: None IMPRESSION:  Findings consistent with anoxic brain injury. 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 Evolutionary changes likely related to involving ischemia compared with the prior MRI of 12/07/2020. Diffusion-weighted signal is less apparent on the current study compared to the prior study. Correlate with neurologic exam. Sclerotic lesion right parietal bone, uncertain etiology. Electronically Signed   By: Franchot Gallo M.D.   On: 12/22/2020 12:46   MR BRAIN W WO CONTRAST  Result Date:  12/23/2020 CLINICAL DATA:  Anoxic brain injury following cardiac arrest. EXAM: MRI HEAD WITHOUT AND WITH CONTRAST TECHNIQUE: Multiplanar, multiecho pulse sequences of the brain and surrounding structures were obtained without and with intravenous contrast. CONTRAST:  60mL GADAVIST GADOBUTROL 1 MMOL/ML IV SOLN COMPARISON:  MRI head 12/07/2020 FINDINGS: Brain: Progressive restricted diffusion in the globus pallidus bilaterally. Diffusion signal in the caudate and putamen improved. There is progressive FLAIR edema in the caudate and putamen compared to the prior study. Previously noted restricted diffusion throughout the cerebral cortex shows interval improvement however there remains mild cortical edema in the frontal parietal and temporal lobes bilaterally. Ventricle size normal. Negative for hemorrhage or mass. Normal enhancement following contrast administration. Vascular: Normal arterial flow voids. Skull and upper cervical spine: 3 cm sclerotic lesion in the right anterior parietal bone as noted on CT. No other skull lesion identified. Sinuses/Orbits: Mild mucosal edema paranasal sinuses. Bilateral mastoid effusion. Negative orbit Other: None IMPRESSION: Findings consistent with anoxic brain injury. 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 Evolutionary changes likely related to involving ischemia compared with the prior MRI of 12/07/2020. Diffusion-weighted signal is less apparent on the current study compared to the prior study. Correlate with neurologic exam. Sclerotic lesion right parietal bone, uncertain etiology. Electronically Signed   By: Franchot Gallo M.D.   On: 12/22/2020 12:46   DG Chest Port 1 View  Result Date: 12/20/2020 CLINICAL DATA:  Tachypnea EXAM: PORTABLE CHEST 1 VIEW COMPARISON:  12/11/2020 FINDINGS: Tracheostomy has been placed in good position. Left arm PICC tip in the mid SVC. Improved aeration lungs. Mild bilateral airspace disease  remains. No effusion. IMPRESSION: Mild bibasilar airspace disease possible pneumonia or atelectasis Satisfactory tracheostomy Satisfactory PICC placement Electronically Signed   By: Franchot Gallo M.D.   On: 12/20/2020 18:04   [place] EKG SITE RITE  Result Date: 12/12/2020 If Site Rite image not attached, placement could not be confirmed due to current cardiac rhythm.     Subjective: No change.  No fever, no respiratory distress.  Discharge Exam: Vitals:   01/10/21 1231 01/10/21 1314  BP: 138/74 (!) 131/96  Pulse: 80 87  Resp: 19 18  Temp: 98.1 F (36.7 C) 98.4 F (36.9 C)  SpO2:  98%   Vitals:   01/10/21 0308 01/10/21 0803 01/10/21 1231 01/10/21 1314  BP: (!) 157/87 (!) 141/84 138/74 (!) 131/96  Pulse: (!) 105 72 80 87  Resp: 18 18 19 18   Temp: 98.1 F (36.7 C) 98.4 F (36.9 C) 98.1 F (36.7 C) 98.4 F (36.9 C)  TempSrc: Axillary  Axillary   SpO2: 100% 100%  98%  Weight: 77.2 kg     Height:        General: Pt is awake, not in acute distress, not restless, lying in bed, tracheostomy on blow-by humidified air Cardiovascular: RRR, nl S1-S2, no murmurs appreciated.   No LE edema.   Respiratory: Normal respiratory rate and rhythm.  CTAB without rales or wheezes. Abdominal:  Abdomen soft and non-tender.  No distension or HSM.   Neuro/Psych: Arms and legs contracturing some.  Mentation poor but follows simple commands, makes eye contact.      The results of significant diagnostics from this hospitalization (including imaging, microbiology, ancillary and laboratory) are listed below for reference.     Microbiology: No results found for this or any previous visit (from the past 240 hour(s)).   Labs: BNP (last 3 results) No results for input(s): BNP in the last 8760 hours. Basic Metabolic Panel: Recent Labs  Lab 01/05/21 0440 01/06/21 0404 01/07/21 0458 01/08/21 0707 01/09/21 0551 01/10/21 0553  NA 143 145 146* 144 144 142  K 3.5 3.3* 2.8* 2.8* 3.1* 3.1*  CL 104 105  107 106 106 104  CO2 29 27 29 27 27 27   GLUCOSE 145* 126* 126* 165* 162* 148*  BUN 15 9 7 11 9 10   CREATININE 0.58 0.45 0.66 0.72 0.48 0.42*  CALCIUM 9.3 9.3 9.3 9.0 9.1 9.3  MG 2.2 2.1 1.8 1.8 1.9  --   PHOS 5.4* 3.8 4.1 4.6 3.6  --    Liver Function Tests: Recent Labs  Lab 01/04/21 0421 01/05/21 0440  AST 23 27  ALT 16 16  ALKPHOS 75 70  BILITOT 0.3 0.5  PROT 7.8 7.7  ALBUMIN 3.0* 3.1*   No results for input(s): LIPASE, AMYLASE in the last 168 hours. No results for input(s): AMMONIA in the last 168 hours. CBC: Recent Labs  Lab 01/04/21 0421 01/05/21 0440 01/06/21 0404 01/07/21 0458 01/08/21 0707 01/09/21 0551  WBC 7.6 7.4 7.1 6.1 5.6 7.6  NEUTROABS 4.7 4.9 4.6 3.6 3.3  --   HGB 10.2* 10.7* 10.6* 10.8* 10.9* 11.0*  HCT 34.3* 35.4* 35.1* 34.8* 35.1* 35.4*  MCV 87.5 86.6 86.0 85.7 85.6 85.7  PLT 245 210 208 208 203 228   Cardiac Enzymes: No results for input(s): CKTOTAL, CKMB, CKMBINDEX, TROPONINI in the last 168 hours. BNP: Invalid input(s): POCBNP CBG: Recent Labs  Lab 01/09/21 1945 01/10/21 0006 01/10/21 0352 01/10/21 0803 01/10/21 1233  GLUCAP 140* 163* 152* 151* 159*   D-Dimer No results for input(s): DDIMER in the last 72 hours. Hgb A1c No results for input(s): HGBA1C in the last 72 hours. Lipid Profile No results for input(s): CHOL, HDL, LDLCALC, TRIG, CHOLHDL, LDLDIRECT in the last 72 hours. Thyroid function studies Recent Labs    01/09/21 0551  TSH 1.241   Anemia work up No results for input(s): VITAMINB12, FOLATE, FERRITIN, TIBC, IRON, RETICCTPCT in the last 72 hours. Urinalysis    Component Value Date/Time   COLORURINE YELLOW (A) 12/10/2020 1430   APPEARANCEUR HAZY (A) 12/10/2020 1430   LABSPEC 1.026 12/10/2020 1430   PHURINE 5.0 12/10/2020 1430   GLUCOSEU NEGATIVE 12/10/2020 1430   HGBUR NEGATIVE 12/10/2020 1430   BILIRUBINUR NEGATIVE 12/10/2020 1430   KETONESUR NEGATIVE 12/10/2020 1430   PROTEINUR 30 (A) 12/10/2020 1430    NITRITE NEGATIVE 12/10/2020 1430   LEUKOCYTESUR TRACE (A) 12/10/2020 1430   Sepsis Labs Invalid input(s): PROCALCITONIN,  WBC,  LACTICIDVEN Microbiology No results found for this or any previous visit (from the past 240 hour(s)).   Time coordinating discharge: 35 minutes The [place] controlled substances registry was reviewed for this patient      SIGNED:   Edwin Dada, MD  [facility] 01/10/2021, 1:34 PM

## 2021-01-10 NOTE — Progress Notes (Signed)
Inpatient Rehabilitation Medication Review by a Pharmacist  A complete drug regimen review was completed for this patient to identify any potential clinically significant medication issues.  Clinically significant medication issues were identified:  No  Check AMION for pharmacist assigned to patient if future medication questions/issues arise during this admission.  Time spent performing this drug regimen review (minutes):  [place], PharmD, BCPS, [facility] Clinical Pharmacist 01/10/2021 3:04 PM

## 2021-01-10 NOTE — Progress Notes (Signed)
Patient transferred from [facility] by [place]. Patient alert and nodes appropriately. Patient remains trach and denies pain or distress. Patient oriented to floor and made aware of plan of care. Will continue to monitor

## 2021-01-10 NOTE — Progress Notes (Signed)
Inpatient Rehabilitation  Patient information reviewed and entered into eRehab system by Desaray Marschner M. Sharnee Douglass, M.A., CCC/SLP, PPS Coordinator.  Information including medical coding, functional ability and quality indicators will be reviewed and updated through discharge.    

## 2021-01-10 NOTE — H&P (Signed)
Physical Medicine and Rehabilitation Admission H&P    Chief Complaint  Patient presents with  . Cardiac Arrest  : HPI: [name] [name] is a 55 year old right-handed female with history of obesity with BMI 31.47, hypertension on no medications prior to admission as well as remote history of tobacco use.  History taken from chart review due to cognition.  Patient lives with spouse and her children.  Reportedly independent prior to admission.  Her children can help as needed.  Presented to [facility] on 12/02/2020 with witnessed cardiac arrest.  Per EMS she received 2 shocks by FD prior to EMS arrival.  After EMS arrived she was noted to be in ventricular fibrillation.  She did receive 1 round of epi.  Total downtime was unknown.  On arrival to the ED her GCS was 3.  She required intubation for airway protection.  Admission chemistries potassium 2.1, CO2 20, glucose 357, creatinine 1.01, AST 138, ALT 120, hemoglobin 9.7, WBC 11,900, hemoglobin A1c 5.9, troponin 71 and peaked at 900, lactic acid 10 urinalysis negative nitrite.  Code sepsis was initiated placed on ceftriaxone.  CT/MRI unremarkable for acute intracranial process..  Chest x-ray with no edema or airspace opacity.  She was completing a course of Unasyn for aspiration pneumonia.  EEG negative for seizures, however it was suggestive of severe diffuse encephalopathy and patient remains on Keppra for seizure prophylaxis.. Echocardiogram with ejection fraction of 60-65%, no wall motion abnormalities and no signs of right heart strain.  Palliative care was consulted to establish goals of care.  Hospital course tracheostomy 12/13/2020 per Dr. Clyde Canterbury as well as placement of gastrostomy tube for nutritional support on 12/13/2020 per Dr. Andrey Farmer and currently remains n.p.o.  Cardiology services follow-up for ischemic evaluation  post V. fib arrest Dr. Ida Rogue and no current plan for invasive ischemic evaluation at this time given suboptimal  candidate in the setting of current comorbidity conditions... Neurology follow-up for suspect anoxic brain injury with initial CT MRI unremarkable it was repeated 12/23/2020 findings consistent with anoxic brain injury.  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  Evolutionary changes likely related to evolving ischemia compared with prior MRI of 12/07/2020.  Therapy evaluations completed due to patient's cognitive deficits and decreased functional mobility was admitted for a comprehensive rehab program.  Please see preadmission assessment from earlier today as well.  Review of Systems  Unable to perform ROS: Mental acuity   Past Medical History:  Diagnosis Date  . Hypertension    Past Surgical History:  Procedure Laterality Date  . PEG PLACEMENT N/A 12/13/2020   Procedure: PERCUTANEOUS ENDOSCOPIC GASTROSTOMY (PEG) PLACEMENT;  Surgeon: Lesly Rubenstein, MD;  Location: [facility] ENDOSCOPY;  Service: Endoscopy;  Laterality: N/A;  . TRACHEOSTOMY TUBE PLACEMENT N/A 12/13/2020   Procedure: TRACHEOSTOMY;  Surgeon: Clyde Canterbury, MD;  Location: [facility] ORS;  Service: ENT;  Laterality: N/A;   Family History  Problem Relation Age of Onset  . Hypertension Mother   . Leukemia Mother   . Diabetes Mother   . Healthy Father    Social History:  reports that she quit smoking about 15 years ago. She has never used smokeless tobacco. She reports that she does not drink alcohol and does not use drugs. Allergies: No Known Allergies Medications Prior to Admission  Medication Sig Dispense Refill  . acetaminophen (TYLENOL) 325 MG tablet Place 2 tablets (650 mg total) into feeding tube every 8 (eight) hours as needed for  mild pain or fever.    Derrill Memo ON 01/11/2021] amLODipine (NORVASC) 10 MG tablet Place 1 tablet (10 mg total) into feeding tube daily.    Derrill Memo ON 01/11/2021] atorvastatin (LIPITOR) 80 MG tablet Place 1 tablet (80 mg total) into feeding tube daily.    .  bisacodyl (DULCOLAX) 10 MG suppository Place 1 suppository (10 mg total) rectally daily as needed for moderate constipation. 12 suppository 0  . clonazePAM (KLONOPIN) 0.5 MG tablet Place 1 tablet (0.5 mg total) into feeding tube 2 (two) times daily. 30 tablet 0  . guaiFENesin (ROBITUSSIN) 100 MG/5ML SOLN Place 10 mLs (200 mg total) into feeding tube every 4 (four) hours as needed for cough or to loosen phlegm. 236 mL 0  . levETIRAcetam (KEPPRA) 100 MG/ML solution Place 10 mLs (1,000 mg total) into feeding tube 2 (two) times daily. 473 mL 12  . metoCLOPramide (REGLAN) 10 MG/10ML SOLN Place 5 mLs (5 mg total) into feeding tube 3 (three) times daily before meals. 1200 mL 0  . metoprolol tartrate (LOPRESSOR) 25 MG tablet Place 1 tablet (25 mg total) into feeding tube 2 (two) times daily.    . Multiple Vitamin (MULTIVITAMIN) LIQD Place 15 mLs into feeding tube daily.    . Nutritional Supplements (FEEDING SUPPLEMENT, OSMOLITE 1.5 CAL,) LIQD Place 1,000 mLs into feeding tube daily.  0  . Nutritional Supplements (FEEDING SUPPLEMENT, PROSOURCE TF,) liquid Place 45 mLs into feeding tube 2 (two) times daily.    Marland Kitchen oxyCODONE (OXY IR/ROXICODONE) 5 MG immediate release tablet Place 1 tablet (5 mg total) into feeding tube every 8 (eight) hours. 30 tablet 0  . [START ON 01/11/2021] polyethylene glycol (MIRALAX / GLYCOLAX) 17 g packet Place 17 g into feeding tube daily. 14 each 0  . potassium chloride SA (KLOR-CON) 20 MEQ tablet Take 1 tablet (20 mEq total) by mouth at bedtime.    Marland Kitchen QUEtiapine (SEROQUEL) 25 MG tablet Place 1 tablet (25 mg total) into feeding tube at bedtime.    Derrill Memo ON 01/11/2021] senna-docusate (SENOKOT-S) 8.6-50 MG tablet Place 1 tablet into feeding tube daily.    . Water For Irrigation, Sterile (FREE WATER) SOLN Place 30 mLs into feeding tube every 4 (four) hours.      Drug Regimen Review Drug regimen was reviewed and remains appropriate with no significant issues identified.  Home: Home  Living Family/patient expects to be discharged to:: Inpatient rehab Living Arrangements: Spouse/significant other,Children Available Help at Discharge: Family,Available 24 hours/day Type of Home: House Additional Comments: Pt's husband and grown children are very dedicated to her care. Pt was independent PTA.   Functional History: Prior Function Level of Independence: Independent Comments: Pt unable to provide PLOF; family to inform OT as able  Functional Status:  Mobility: Bed Mobility Overal bed mobility: Needs Assistance Bed Mobility: Supine to Sit,Sit to Supine Supine to sit: HOB elevated,Max assist Sit to supine: Total assist General bed mobility comments: Pt moving B LEs towards EOB for supine >sit Transfers Overall transfer level: Needs assistance Equipment used: Sliding board Transfers: Lateral/Scoot Transfers Sit to Stand: Max assist,+2 physical assistance,+2 safety/equipment  Lateral/Scoot Transfers: Max assist,+2 physical assistance General transfer comment: lateral scoot 1x to L along bed (used of bed sheet to assist); assist for balance d/t mild posterior lean Ambulation/Gait General Gait Details: not appropriate at this time    ADL: ADL Overall ADL's : Needs assistance/impaired Grooming: Wash/dry face,Sitting,Minimal assistance Grooming Details (indicate cue type and reason): min A to initiate and then pt  performs Upper Body Dressing : Maximal assistance Upper Body Dressing Details (indicate cue type and reason): changing gown- pushed L UE through sleeve  Cognition: Cognition Overall Cognitive Status: Difficult to assess Orientation Level: (P) Intubated/Tracheostomy - Unable to assess Cognition Arousal/Alertness: Awake/alert Behavior During Therapy: Flat affect Overall Cognitive Status: Difficult to assess General Comments: Pt remains non-verbal but continues to nod head yes/no and blows kiss to therapist at end of session Difficult to assess due to:  Tracheostomy  Physical Exam: Blood pressure (!) 131/96, pulse 87, temperature 98.4 F (36.9 C), resp. rate 18, height 5' 0.98" (1.549 m), weight 77.2 kg, SpO2 98 %. Physical Exam Vitals reviewed.  Constitutional:      General: She is not in acute distress.    Appearance: She is obese.  HENT:     Head: Normocephalic and atraumatic.     Right Ear: External ear normal.     Nose: Nose normal.  Eyes:     General:        Right eye: No discharge.        Left eye: No discharge.     Extraocular Movements: Extraocular movements intact.  Neck:     Comments: + Trach with trach collar Cardiovascular:     Rate and Rhythm: Normal rate and regular rhythm.  Pulmonary:     Effort: Pulmonary effort is normal. No respiratory distress.     Breath sounds: No stridor.  Abdominal:     General: Abdomen is flat. There is no distension.     Comments: + Gastrostomy tube in place  Musculoskeletal:     Comments: No edema or tenderness in extremities  Skin:    General: Skin is warm and dry.  Neurological:     Mental Status: She is alert.     Comments: Alert Makes eye contact with examiner.   Motor: Limited due to participation, however spontaneously moving extremities, lower extremities > upper extremities, with?  Increased right upper extremity weakness  Psychiatric:     Comments: Unable to assess due to cognition     Results for orders placed or performed during the hospital encounter of 12/02/20 (from the past 48 hour(s))  Glucose, capillary     Status: Abnormal   Collection Time: 01/08/21  4:29 PM  Result Value Ref Range   Glucose-Capillary 149 (H) 70 - 99 mg/dL    Comment: Glucose reference range applies only to samples taken after fasting for at least 8 hours.  Glucose, capillary     Status: Abnormal   Collection Time: 01/08/21  8:31 PM  Result Value Ref Range   Glucose-Capillary 130 (H) 70 - 99 mg/dL    Comment: Glucose reference range applies only to samples taken after fasting for at  least 8 hours.  Glucose, capillary     Status: Abnormal   Collection Time: 01/08/21 11:37 PM  Result Value Ref Range   Glucose-Capillary 118 (H) 70 - 99 mg/dL    Comment: Glucose reference range applies only to samples taken after fasting for at least 8 hours.  Glucose, capillary     Status: Abnormal   Collection Time: 01/09/21  4:24 AM  Result Value Ref Range   Glucose-Capillary 147 (H) 70 - 99 mg/dL    Comment: Glucose reference range applies only to samples taken after fasting for at least 8 hours.  Magnesium     Status: None   Collection Time: 01/09/21  5:51 AM  Result Value Ref Range   Magnesium 1.9 1.7 - 2.4  mg/dL    Comment: Performed at [facility], [address] [place], [place] [address]  Phosphorus     Status: None   Collection Time: 01/09/21  5:51 AM  Result Value Ref Range   Phosphorus 3.6 2.5 - 4.6 mg/dL    Comment: Performed at [facility], [address] [place], [place] [address]  CBC     Status: Abnormal   Collection Time: 01/09/21  5:51 AM  Result Value Ref Range   WBC 7.6 4.0 - 10.5 K/uL   RBC 4.13 3.87 - 5.11 MIL/uL   Hemoglobin 11.0 (L) 12.0 - 15.0 g/dL   HCT 35.4 (L) 36.0 - 46.0 %   MCV 85.7 80.0 - 100.0 fL   MCH 26.6 26.0 - 34.0 pg   MCHC 31.1 30.0 - 36.0 g/dL   RDW 19.6 (H) 11.5 - 15.5 %   Platelets 228 150 - 400 K/uL   nRBC 0.0 0.0 - 0.2 %    Comment: Performed at [facility], [address], [place], [place] [address]  Basic metabolic panel     Status: Abnormal   Collection Time: 01/09/21  5:51 AM  Result Value Ref Range   Sodium 144 135 - 145 mmol/L   Potassium 3.1 (L) 3.5 - 5.1 mmol/L   Chloride 106 98 - 111 mmol/L   CO2 27 22 - 32 mmol/L   Glucose, Bld 162 (H) 70 - 99 mg/dL    Comment: Glucose reference range applies only to samples taken after fasting for at least 8 hours.   BUN 9 6 - 20 mg/dL   Creatinine, Ser 0.48 0.44 - 1.00 mg/dL   Calcium 9.1 8.9 - 10.3 mg/dL   GFR, Estimated >60 >60 mL/min     Comment: (NOTE) Calculated using the CKD-EPI Creatinine Equation (2021)    Anion gap 11 5 - 15    Comment: Performed at [facility], [address] [place],  [address]  TSH     Status: None   Collection Time: 01/09/21  5:51 AM  Result Value Ref Range   TSH 1.241 0.350 - 4.500 uIU/mL    Comment: Performed by a 3rd Generation assay with a functional sensitivity of <=0.01 uIU/mL. Performed at [facility], [address] [place],  [address]   Glucose, capillary     Status: Abnormal   Collection Time: 01/09/21  7:24 AM  Result Value Ref Range   Glucose-Capillary 147 (H) 70 - 99 mg/dL    Comment: Glucose reference range applies only to samples taken after fasting for at least 8 hours.  Glucose, capillary     Status: Abnormal   Collection Time: 01/09/21 11:36 AM  Result Value Ref Range   Glucose-Capillary 138 (H) 70 - 99 mg/dL    Comment: Glucose reference range applies only to samples taken after fasting for at least 8 hours.  Glucose, capillary     Status: Abnormal   Collection Time: 01/09/21  4:00 PM  Result Value Ref Range   Glucose-Capillary 158 (H) 70 - 99 mg/dL    Comment: Glucose reference range applies only to samples taken after fasting for at least 8 hours.  Glucose, capillary     Status: Abnormal   Collection Time: 01/09/21  7:45 PM  Result Value Ref Range   Glucose-Capillary 140 (H) 70 - 99 mg/dL    Comment: Glucose reference range applies only to samples taken after fasting for at least 8 hours.  Glucose, capillary  Status: Abnormal   Collection Time: 01/10/21 12:06 AM  Result Value Ref Range   Glucose-Capillary 163 (H) 70 - 99 mg/dL    Comment: Glucose reference range applies only to samples taken after fasting for at least 8 hours.  Glucose, capillary     Status: Abnormal   Collection Time: 01/10/21  3:52 AM  Result Value Ref Range   Glucose-Capillary 152 (H) 70 - 99 mg/dL    Comment: Glucose reference range applies only to  samples taken after fasting for at least 8 hours.  Basic metabolic panel     Status: Abnormal   Collection Time: 01/10/21  5:53 AM  Result Value Ref Range   Sodium 142 135 - 145 mmol/L   Potassium 3.1 (L) 3.5 - 5.1 mmol/L   Chloride 104 98 - 111 mmol/L   CO2 27 22 - 32 mmol/L   Glucose, Bld 148 (H) 70 - 99 mg/dL    Comment: Glucose reference range applies only to samples taken after fasting for at least 8 hours.   BUN 10 6 - 20 mg/dL   Creatinine, Ser 0.42 (L) 0.44 - 1.00 mg/dL   Calcium 9.3 8.9 - 10.3 mg/dL   GFR, Estimated >60 >60 mL/min    Comment: (NOTE) Calculated using the CKD-EPI Creatinine Equation (2021)    Anion gap 11 5 - 15    Comment: Performed at [facility], [address] [place], [place] [address]  Glucose, capillary     Status: Abnormal   Collection Time: 01/10/21  8:03 AM  Result Value Ref Range   Glucose-Capillary 151 (H) 70 - 99 mg/dL    Comment: Glucose reference range applies only to samples taken after fasting for at least 8 hours.  Glucose, capillary     Status: Abnormal   Collection Time: 01/10/21 12:33 PM  Result Value Ref Range   Glucose-Capillary 159 (H) 70 - 99 mg/dL    Comment: Glucose reference range applies only to samples taken after fasting for at least 8 hours.   DG Abd 1 View  Result Date: 01/09/2021 CLINICAL DATA:  Vomiting. EXAM: ABDOMEN - 1 VIEW COMPARISON:  Abdominal x-ray dated December 31, 2020. FINDINGS: Gastrostomy tube again noted. The bowel gas pattern is normal. No radio-opaque calculi or other significant radiographic abnormality are seen. No acute osseous abnormality. IMPRESSION: Negative. Electronically Signed   By: Titus Dubin M.D.   On: 01/09/2021 15:40       Medical Problem List and Plan: 1.  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  Antithrombotics: -DVT/anticoagulation:  SCDs  -antiplatelet therapy: N/A 3. Pain Management: Oxycodone as needed 4. Mood: Klonopin 0.5 mg twice daily  -antipsychotic agents: Seroquel 25 mg nightly   This medication is necessary. 5. Neuropsych: This patient is not capable of making decisions on her own behalf. 6. Skin/Wound Care: Routine skin checks 7. Fluids/Electrolytes/Nutrition: Routine in and outs.  CMP ordered 8.  Seizure prophylaxis.  Keppra 1000 mg twice daily.  EEG negative 9.  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  We will plan to decannulate if tolerated. 10.  Dysphagia with decreased nutritional storage.  Gastrostomy tube 12/13/2020 per Dr. Andrey Farmer.  Dietary follow-up.  Advance diet as tolerated 11.  Aspiration pneumonia.  5-day course of Unasyn completed. 12.  Hypertension.  Norvasc 10 mg daily, Lopressor 25 mg twice daily.  Patient would follow-up cardiology services.  Monitor with increased mobility 13.  Hyperlipidemia: Lipitor 14.  Morbid obesity.  BMI 31.47.  Dietary follow-up 15.  Prediabetes.  Hemoglobin A1c 5.9.  CBGs monitored while on tube feeds  Moderate increased  Cathlyn Parsons, PA-C 01/10/2021  I have personally performed a face to face diagnostic evaluation, including, but not limited to relevant history and physical exam findings, of this patient and developed relevant assessment and plan.  Additionally, I have reviewed and concur with the physician assistant's documentation above.  Delice Lesch, MD, ABPMR  The patient's status has not changed. Any changes from the pre-admission screening or documentation from the acute chart are noted above.   Delice Lesch, MD, ABPMR

## 2021-01-10 NOTE — Progress Notes (Signed)
Notified Dan, PA that patient was coughing and copious secretions out of trach and mouth. Secretions performed both oral and per trach. Prn medications given as ordered and order for continuous pulse per order. Patient coughing per secretions

## 2021-01-10 NOTE — Progress Notes (Signed)
Orthopedic Tech Progress Note Patient Details:  [name] [name] 1966/01/29 [id number] Called in order to [facility] for BLE PRAFO BOOTS Patient ID: [name] [name], female   DOB: 08-05-1966, 55 y.o.   MRN: [id number]   Janit Pagan 01/10/2021, 2:53 PM

## 2021-01-10 NOTE — Progress Notes (Signed)
RT at the bedside, suctioned small, thick, plug, which ignited a copious amt of clear, thin, secretions, pt. spontaneously coughing, secretions coming out of her mouth and trach.  RN called a rapid response, SP02 98-100%, on 28% throughout the response, HOB 45 degree, RT applied 1 cc of air via syringe, to help prevent aspiration.  Pt was suctioned to help clear the airway and mouth, coughing subsided, Robitussin given by RN.  Pt is stable and response all clear.  Will continue to monitor.

## 2021-01-10 NOTE — Progress Notes (Signed)
Initial Nutrition Assessment  DOCUMENTATION CODES:   Not applicable  INTERVENTION:   Continuous tube feeding via G-tube: - Start Osmolite 1.5 @ 35 ml/hr and advance by 10 ml q 12 hours to goal rate of 55 ml/hr (tube feeding can be held for up to 4 hours for therapies) - ProSource 45 ml BID - Free water flushes of 30 ml q 4 hours  Tube feeding regimen at goal provides 1730 kcal, 91 grams of protein, and 838 ml of H2O.  Total free water with flushes: 1018 ml   - MVI with minerals daily per tube  NUTRITION DIAGNOSIS:   Inadequate oral intake related to dysphagia as evidenced by NPO status.  GOAL:   Patient will meet greater than or equal to 90% of their needs  MONITOR:   Diet advancement,Labs,Weight trends,TF tolerance,I & O's  REASON FOR ASSESSMENT:   Consult Enteral/tube feeding initiation and management,Assessment of nutrition requirement/status  ASSESSMENT:   55 year old female with PMH of HTN. Presented to [facility] on 12/02/20 with witnessed cardiac arrest. Pt required intubation for airway protection. Pt underwent tracheostomy on 12/13/20 as well as placement of G-tube. Pt remains NPO. Imaging studies consistent with anoxic brain injury. Admitted to CIR on 01/10/21.   Unable to obtain diet and weight history from pt at this time. Multiple RNs at bedside reporting pt just had a "coughing fit." Pt on trach collar.  Discussed pt with RD from acute admission. Reviewed RD notes from acute admission. Pt was changed from bolus tube feeding back to continuous tube feeding due to N/V. A KUB was obtained yesterday which was negative. Pt was started on Reglan. Will continue with continuous tube feeds at this time.  Reviewed weight history. Pt weighed 86.2 kg on 12/02/20. Current weight is 77.2 kg. This is a 9 kg (10.4%) weight loss in less than 2 months which is severe and significant for timeframe. Pt at risk for malnutrition but unable to confirm without NFPE. Will complete NFPE at  follow-up.  Medications reviewed and include: reglan 5 mg TID, liquid MVI, miralax, klor-con 20 mEq daily, senna  Labs reviewed: potassium 3.1 CBG's: 140-163 x 24 hours  NUTRITION - FOCUSED PHYSICAL EXAM:  Unable to complete at this time. Will completed at follow-up.  Diet Order:   Diet Order    None      EDUCATION NEEDS:   No education needs have been identified at this time  Skin:  Skin Assessment: Skin Integrity Issues: Incisions: neck  Last BM:  01/10/21  Height:   Ht Readings from Last 1 Encounters:  12/17/20 5' 0.98" (1.549 m)    Weight:   Wt Readings from Last 1 Encounters:  01/10/21 77.2 kg    Estimated Nutritional Needs:   Kcal:  1700-1900  Protein:  85-100 grams  Fluid:  1.7-1.9 L    Gustavus Bryant, MS, RD, LDN Inpatient Clinical Dietitian Please see AMiON for contact information.

## 2021-01-11 DIAGNOSIS — G931 Anoxic brain damage, not elsewhere classified: Secondary | ICD-10-CM

## 2021-01-11 LAB — GLUCOSE, CAPILLARY
Glucose-Capillary: 142 mg/dL — ABNORMAL HIGH (ref 70–99)
Glucose-Capillary: 152 mg/dL — ABNORMAL HIGH (ref 70–99)
Glucose-Capillary: 154 mg/dL — ABNORMAL HIGH (ref 70–99)
Glucose-Capillary: 157 mg/dL — ABNORMAL HIGH (ref 70–99)
Glucose-Capillary: 161 mg/dL — ABNORMAL HIGH (ref 70–99)
Glucose-Capillary: 174 mg/dL — ABNORMAL HIGH (ref 70–99)

## 2021-01-11 LAB — BASIC METABOLIC PANEL
Anion gap: 17 — ABNORMAL HIGH (ref 5–15)
BUN: 14 mg/dL (ref 6–20)
CO2: 21 mmol/L — ABNORMAL LOW (ref 22–32)
Calcium: 9.6 mg/dL (ref 8.9–10.3)
Chloride: 104 mmol/L (ref 98–111)
Creatinine, Ser: 0.53 mg/dL (ref 0.44–1.00)
GFR, Estimated: >60 mL/min (ref >60)
Glucose, Bld: 189 mg/dL — ABNORMAL HIGH (ref 70–99)
Potassium: 3.6 mmol/L (ref 3.5–5.1)
Sodium: 142 mmol/L (ref 135–145)

## 2021-01-11 MED ORDER — POTASSIUM CHLORIDE 20 MEQ PO PACK
20.0000 meq | PACK | Freq: Every day | ORAL | Status: DC
  Administered 2021-01-12 – 2021-02-03 (×23): 20 meq
  Filled 2021-01-11 (×23): qty 1

## 2021-01-11 MED ORDER — OXYCODONE HCL 5 MG PO TABS
5.0000 mg | ORAL_TABLET | Freq: Two times a day (BID) | ORAL | Status: DC
  Administered 2021-01-11 – 2021-01-12 (×2): 5 mg
  Filled 2021-01-11 (×2): qty 1

## 2021-01-11 MED ORDER — POTASSIUM CHLORIDE 20 MEQ PO PACK: 20.0000 meq | PACK | Freq: Every day | ORAL | Status: DC

## 2021-01-11 NOTE — Plan of Care (Signed)
  Problem: Consults Goal: RH BRAIN INJURY PATIENT EDUCATION Description: Description: See Patient Education module for eduction specifics Outcome: Not Progressing   Problem: RH BOWEL ELIMINATION Goal: RH STG MANAGE BOWEL WITH ASSISTANCE Description: STG Manage Bowel with Assistance. Mod Outcome: Not Progressing   Problem: RH BLADDER ELIMINATION Goal: RH STG MANAGE BLADDER WITH ASSISTANCE Description: STG Manage Bladder With mod Assistance Outcome: Not Progressing   Problem: RH SAFETY Goal: RH STG ADHERE TO SAFETY PRECAUTIONS W/ASSISTANCE/DEVICE Description: STG Adhere to Safety Precautions With Assistance/Device. Mod assist Outcome: Not Progressing   Problem: RH COGNITION-NURSING Goal: RH STG USES MEMORY AIDS/STRATEGIES W/ASSIST TO PROBLEM SOLVE Description: STG Uses Memory Aids/Strategies With Assistance to Problem Solve. Mod assist Outcome: Not Progressing

## 2021-01-11 NOTE — Evaluation (Signed)
Physical Therapy Assessment and Plan  Patient Details  Name: [name] [name] MRN: [id number] Date of Birth: 1966-02-02  PT Diagnosis: Abnormal posture, Abnormality of gait, Cognitive deficits, Difficulty walking, Edema, Hemiplegia dominant, Hypertonia, Impaired cognition and Muscle weakness Rehab Potential: Good ELOS: 3.5-4 weeks   Today's Date: 01/11/2021 PT Individual Time: 1310-1410 PT Individual Time Calculation (min): 60 min    Hospital Problem: Principal Problem:   Anoxic brain injury [facility])   Past Medical History:  Past Medical History:  Diagnosis Date  . Hypertension    Past Surgical History:  Past Surgical History:  Procedure Laterality Date  . PEG PLACEMENT N/A 12/13/2020   Procedure: PERCUTANEOUS ENDOSCOPIC GASTROSTOMY (PEG) PLACEMENT;  Surgeon: Lesly Rubenstein, MD;  Location: [facility] ENDOSCOPY;  Service: Endoscopy;  Laterality: N/A;  . TRACHEOSTOMY TUBE PLACEMENT N/A 12/13/2020   Procedure: TRACHEOSTOMY;  Surgeon: Clyde Canterbury, MD;  Location: [facility] ORS;  Service: ENT;  Laterality: N/A;    Assessment & Plan Clinical Impression: Patient is a 55 y.o. right-handed female with history of obesity with BMI 31.47, hypertension on no medications prior to admission as well as remote history of tobacco use.  History taken from chart review due to cognition.  Patient lives with spouse and her children.  Reportedly independent prior to admission.  Her children can help as needed.  Presented to [facility] on 12/02/2020 with witnessed cardiac arrest.  Per EMS she received 2 shocks by FD prior to EMS arrival.  After EMS arrived she was noted to be in ventricular fibrillation.  She did receive 1 round of epi.  Total downtime was unknown.  On arrival to the ED her GCS was 3.  She required intubation for airway protection.  Admission chemistries potassium 2.1, CO2 20, glucose 357, creatinine 1.01, AST 138, ALT 120, hemoglobin 9.7, WBC 11,900, hemoglobin A1c 5.9, troponin 71 and peaked at 900, lactic  acid 10 urinalysis negative nitrite.  Code sepsis was initiated placed on ceftriaxone.  CT/MRI unremarkable for acute intracranial process..  Chest x-ray with no edema or airspace opacity.  She was completing a course of Unasyn for aspiration pneumonia.  EEG negative for seizures, however it was suggestive of severe diffuse encephalopathy and patient remains on Keppra for seizure prophylaxis.. Echocardiogram with ejection fraction of 60-65%, no wall motion abnormalities and no signs of right heart strain.  Palliative care was consulted to establish goals of care.  Hospital course tracheostomy 12/13/2020 per Dr. Clyde Canterbury as well as placement of gastrostomy tube for nutritional support on 12/13/2020 per Dr. Andrey Farmer and currently remains n.p.o.  Cardiology services follow-up for ischemic evaluation  post V. fib arrest Dr. Ida Rogue and no current plan for invasive ischemic evaluation at this time given suboptimal candidate in the setting of current comorbidity conditions... Neurology follow-up for suspect anoxic brain injury with initial CT MRI unremarkable it was repeated 12/23/2020 findings consistent with anoxic brain injury.  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  Evolutionary changes likely related to evolving ischemia compared with prior MRI of 12/07/2020.  Therapy evaluations completed due to patient's cognitive deficits and decreased functional mobility was admitted for a comprehensive rehab program.  Patient transferred to CIR on 01/10/2021 .   Patient currently requires +2 total assist with mobility secondary to muscle weakness, muscle joint tightness and muscle paralysis, decreased cardiorespiratoy endurance and decreased oxygen support, impaired timing and sequencing, abnormal tone, unbalanced muscle activation, motor apraxia, decreased coordination and decreased motor planning, decreased visual  perceptual skills and decreased visual motor  skills, decreased midline orientation, decreased initiation, decreased attention, decreased awareness, decreased problem solving, decreased safety awareness, decreased memory and delayed processing and decreased sitting balance, decreased standing balance, decreased postural control, hemiplegia and decreased balance strategies.  Prior to hospitalization, patient was independent  with mobility and lived with Significant other,Spouse,Other (Comment) (children) in a House.  Home access is 2-3Stairs to enter,Other (comment) (per chart family already planning on getting a ramp).  Patient will benefit from skilled PT intervention to maximize safe functional mobility, minimize fall risk and decrease caregiver burden for planned discharge home with 24 hour assist.  Anticipate patient will benefit from follow up [facility] at discharge.  PT - End of Session Activity Tolerance: Tolerates 30+ min activity with multiple rests Endurance Deficit: Yes Endurance Deficit Description: pt required frequent rest breaks, decreased focused and sustained attention to task PT Assessment Rehab Potential (ACUTE/IP ONLY): Good PT Barriers to Discharge: Home environment access/layout;Trach;Neurogenic Bowel & Bladder;Incontinence;Nutrition means;New oxygen PT Patient demonstrates impairments in the following area(s): Balance;Perception;Behavior;Safety;Edema;Sensory;Endurance;Skin Integrity;Motor;Nutrition;Pain PT Transfers Functional Problem(s): Bed Mobility;Bed to Chair;Car;Furniture PT Locomotion Functional Problem(s): Ambulation;Wheelchair Mobility;Stairs PT Plan PT Intensity: Minimum of 1-2 x/day ,45 to 90 minutes PT Frequency: 5 out of 7 days PT Duration Estimated Length of Stay: 3.5-4 weeks PT Treatment/Interventions: Ambulation/gait training;Community reintegration;DME/adaptive equipment instruction;Neuromuscular re-education;Psychosocial support;Stair training;UE/LE Strength taining/ROM;Wheelchair  propulsion/positioning;Balance/vestibular training;Discharge planning;Functional electrical stimulation;Pain management;Skin care/wound management;Therapeutic Activities;UE/LE Coordination activities;Cognitive remediation/compensation;Patient/family education;Disease management/prevention;Functional mobility training;Splinting/orthotics;Therapeutic Exercise;Visual/perceptual remediation/compensation PT Transfers Anticipated Outcome(s): min assist PT Locomotion Anticipated Outcome(s): min assist PT Recommendation Recommendations for Other Services: Other (comment) (not appropriate at this time due to nonverbal with impaired cognition) Follow Up Recommendations: Home health PT;24 hour supervision/assistance Patient destination: Home Equipment Recommended: To be determined   PT Evaluation Precautions/Restrictions Precautions Precautions: Fall;Other (comment) Precaution Comments: O2 via trach collar; PEG tube, delayed processing/initiation Restrictions Weight Bearing Restrictions: No Pain Pain Assessment Pain Scale: Faces Faces Pain Scale: No hurt Home Living/Prior Functioning Home Living Available Help at Discharge: Family;Available 24 hours/day Type of Home: House Home Access: Stairs to enter;Other (comment) (per chart family already planning on getting a ramp) Entrance Stairs-Number of Steps: 2-3 Entrance Stairs-Rails: None Home Layout: Able to live on main level with bedroom/bathroom Additional Comments: Per acute hospital stay - Pt's husband and grown children are very dedicated to her care. Pt was independent PTA.  No family avaiable for initial eval on CIR and pt nonverbal on trach and PMSV only approved for during SLP therapy  Lives With: Significant other;Spouse;Other (Comment) (children) Prior Function Level of Independence: Independent with gait;Independent with transfers;Independent with homemaking with ambulation  Able to Take Stairs?: Yes Driving: Yes Vocation: Other  (Comment) (not working) Comments: Pt unable to provide PLOF and no family present for eval - above information obtained from chart review Perception  Perception  Perception: Within Functional Limits (will continue to assess) Praxis Praxis: Impaired Praxis Impairment Details: Initiation;Motor planning  Additional Comments: limited assessment due to decreased sustained attention and decreased initiation.   Cognition Overall Cognitive Status: Impaired/Different from baseline Arousal/Alertness: Awake/alert Orientation Level: Intubated/Tracheostomy - Unable to assess (upon asking her name apears to start mouthing Coy but difficult to determine) Attention: Focused Focused Attention: Impaired Focused Attention Impairment: Verbal basic;Functional basic Problem Solving: Impaired Problem Solving Impairment: Functional basic Executive Function:  (all impaired due to lower level deficits) Behaviors: Restless Safety/Judgment: Impaired Comments: difficult to assess cognition and orientation as pt nonverbal during PT session (only wearing PMSV during SLP).  Pt responds  via yes/no head nods during session. Sensation Sensation Light Touch: Impaired by gross assessment (unable to participate in formal assessment due to cognition and nonverbal) Hot/Cold: Not tested Proprioception: Impaired Detail (unable to formally assess but observed with mobility) Proprioception Impaired Details: Impaired LLE;Impaired RLE Stereognosis: Not tested Additional Comments: difficult to assess light touch as pt with delayed initiation to communicate sensation Coordination Gross Motor Movements are Fluid and Coordinated: No Fine Motor Movements are Fluid and Coordinated: No Coordination and Movement Description: impaired due to generalized weakness, R hemiplegia, delayed processing, and impaired motor planning Finger Nose Finger Test: unable to complete due to motor apraxia and impaired initiation and following  commands Heel Shin Test: unable to follow commands to complete Motor  Motor Motor: Hemiplegia;Motor apraxia;Abnormal tone;Abnormal postural alignment and control Motor - Skilled Clinical Observations: decreased initiation, motor planning, and functional use of RUE > LUE   Trunk/Postural Assessment  Cervical Assessment Cervical Assessment: Exceptions to [facility] (forward head) Thoracic Assessment Thoracic Assessment: Exceptions to [facility] (increased thoracic rounding) Lumbar Assessment Lumbar Assessment: Exceptions to [facility] (posterior pelvic tilt and obliquity in sitting) Postural Control Postural Control: Deficits on evaluation Trunk Control: demonstrates R lateral trunk lean with L pelvic elevation Protective Responses: present though delayed and inadequate Postural Limitations: decreased  Balance Balance Balance Assessed: Yes Static Sitting Balance Static Sitting - Balance Support: Left upper extremity supported Static Sitting - Level of Assistance: 2: Max assist;3: Mod assist;4: Min assist (consistent R posterolatearl lean - assisted to midline but gradually worsening lean requiring increasing assist) Dynamic Sitting Balance Dynamic Sitting - Balance Support: During functional activity Dynamic Sitting - Level of Assistance: 3: Mod assist;2: Max assist (repeated R posterolateral LOB) Sitting balance - Comments: pt initially mod assist for sitting balance due to significant posterior lean and R lateral lean, but improved to CGA occasionally, overall min assist for sitting balance for safety Static Standing Balance Static Standing - Balance Support: Bilateral upper extremity supported Static Standing - Level of Assistance: 1: +2 Total assist (in stedy) Extremity Assessment      RLE Assessment RLE Assessment: Exceptions to [facility] Passive Range of Motion (PROM) Comments: [facility] Strength Comments: unable to formally assess due to inability to follow commands - demonstrates at least 2/5 with  movement across all joints LLE Assessment LLE Assessment: Exceptions to [facility] Strength Comments: unable to formally assess due to inability to follow commands - demonstrates at least 2/5 with movement across all joints  Care Tool Care Tool Bed Mobility Roll left and right activity   Roll left and right assist level: Maximal Assistance - Patient 25 - 49%    Sit to lying activity   Sit to lying assist level: Maximal Assistance - Patient 25 - 49%    Lying to sitting edge of bed activity   Lying to sitting edge of bed assist level: Maximal Assistance - Patient 25 - 49%     Care Tool Transfers Sit to stand transfer   Sit to stand assist level: 2 Helpers (+2 total assist in stedy)    Chair/bed transfer Chair/bed transfer activity did not occur: Safety/medical concerns       Toilet transfer Toilet transfer activity did not occur: Safety/medical concerns      Scientist, product/process development transfer activity did not occur: Safety/medical concerns        Care Tool Locomotion Ambulation Ambulation activity did not occur: Safety/medical concerns        Walk 10 feet activity Walk 10 feet activity did not  occur: Safety/medical concerns       Walk 50 feet with 2 turns activity Walk 50 feet with 2 turns activity did not occur: Safety/medical concerns      Walk 150 feet activity Walk 150 feet activity did not occur: Safety/medical concerns      Walk 10 feet on uneven surfaces activity Walk 10 feet on uneven surfaces activity did not occur: Safety/medical concerns      Stairs Stair activity did not occur: Safety/medical concerns        Walk up/down 1 step activity Walk up/down 1 step or curb (drop down) activity did not occur: Safety/medical concerns     Walk up/down 4 steps activity did not occuR: Safety/medical concerns  Walk up/down 4 steps activity      Walk up/down 12 steps activity Walk up/down 12 steps activity did not occur: Safety/medical concerns      Pick up small  objects from floor Pick up small object from the floor (from standing position) activity did not occur: Safety/medical concerns      Wheelchair Will patient use wheelchair at discharge?:  (TBD)          Wheel 50 feet with 2 turns activity      Wheel 150 feet activity        Refer to Care Plan for Long Term Goals  SHORT TERM GOAL WEEK 1 PT Short Term Goal 1 (Week 1): Pt will perform supine<>sit with mod assist of 1 consistently PT Short Term Goal 2 (Week 1): Pt will perform sit<>stands using LRAD with +2 max assist PT Short Term Goal 3 (Week 1): Pt will perform bed<>chair transfers using LRAD with +2 max assist PT Short Term Goal 4 (Week 1): Pt will tolerate standing at least 30 seconds with +2 max assist using LRAD  Recommendations for other services: None   Skilled Therapeutic Intervention Pt received supine in bed having scooted down towards foot of the bed and turned horizontally in the bed in a flexed up position. Therapist placed tube feedings on hold due to risk of aspiration and RN present to disconnect for therapy. Pt nonverbal throughout session though able to respond via yes/no head nods ~25% of the time, tends to respond to questions of more significance (for example: do you want to stand up again?) Evaluation completed (see details above) with patient education regarding purpose of PT evaluation, PT POC and goals, therapy schedule, weekly team meetings, and other CIR information including safety plan and fall risk safety. Throughout session pt appears fatigued with flat affect. Received and maintained on 5L of O2 28% FiO2 via trach collar on continuous monitoring with HR 101bpm and SpO2 95% upon arrival and maintaining WNL during activity.  Supine>sit, R EOB, with heavy max assist for B LE management and bringing trunk upright with pt having strong R lateral and posterior lean. Sitting EOB pt demonstrates significant R lateral trunk lean with L hip elevating off of bed - cuing  and manual facilitation for improvement but pt unable to achieve and maintain midline without assist - requires varying CGA to mod assist for static sitting balance. Attempted R lateral scoot along EOB but unable despite total assist of 1. Pt has R UE hypertonia into flexed posturing limiting ability to WB through that UE while sitting. Therapist retrieved a wheelchair with cushion and R half lap tray for improved upright, OOB activity tolerance. Retrieved a stedy and +2 assistance. Sit>stand EOB>stedy with +2 total assist for lifting to stand - pt  demonstrates initiation of movement and assist with pushing up into stand but unable to achieve full hip extension to place regular stedy seat pads - tolerated partial standing ~10seconds. Pt has excited expression on her face after this but nodes head "no" when asked to attempt a 2nd time. Sit>supine in bed with max assist for B LE management. Pt left supine in bed with HOB elevated, needs in reach, bed alarm on, and R UE supported on pillows - pt quickly falling asleep.   Mobility Bed Mobility Bed Mobility: Rolling Right;Rolling Left;Sit to Supine;Supine to Sit Rolling Right: Maximal Assistance - Patient 25-49% Rolling Left: Maximal Assistance - Patient 25-49% Right Sidelying to Sit: Moderate Assistance - Patient 50-74% Supine to Sit: Maximal Assistance - Patient - Patient 25-49% Sit to Supine: Maximal Assistance - Patient 25-49%;Moderate Assistance - Patient 50-74% Transfers Transfers: Sit to Stand;Stand to Sit Sit to Stand: 2 Helpers;Dependent - mechanical lift (+2 total assist in stedy) Stand to Sit: 2 Helpers;Dependent - mechanical lift (+2 total assist in stedy) Transfer (Assistive device): Other (Comment) (stedy) Locomotion  Gait Ambulation: No Gait Gait: No Stairs / Additional Locomotion Stairs: No Wheelchair Mobility Wheelchair Mobility: No   Discharge Criteria: Patient will be discharged from PT if patient refuses treatment 3  consecutive times without medical reason, if treatment goals not met, if there is a change in medical status, if patient makes no progress towards goals or if patient is discharged from hospital.  The above assessment, treatment plan, treatment alternatives and goals were discussed and mutually agreed upon: No family available/patient unable  Tawana Scale , PT, DPT, CSRS  01/11/2021, 12:32 PM

## 2021-01-11 NOTE — Evaluation (Signed)
Speech Language Pathology Assessment and Plan for Cognition and PMSV  Patient Details  Name: [name] [name] MRN: [id number] Date of Birth: [birth year]  SLP Diagnosis: Voice disorder;Cognitive Impairments  Rehab Potential: Fair ELOS: 4 weeks    Today's Date: 01/11/2021 SLP Individual Time: 4656-8127 SLP Individual Time Calculation (min): 55 min   Hospital Problem: Principal Problem:   Anoxic brain injury [facility])  Past Medical History:  Past Medical History:  Diagnosis Date  . Hypertension    Past Surgical History:  Past Surgical History:  Procedure Laterality Date  . PEG PLACEMENT N/A 12/13/2020   Procedure: PERCUTANEOUS ENDOSCOPIC GASTROSTOMY (PEG) PLACEMENT;  Surgeon: Lesly Rubenstein, MD;  Location: [facility] ENDOSCOPY;  Service: Endoscopy;  Laterality: N/A;  . TRACHEOSTOMY TUBE PLACEMENT N/A 12/13/2020   Procedure: TRACHEOSTOMY;  Surgeon: Clyde Canterbury, MD;  Location: [facility] ORS;  Service: ENT;  Laterality: N/A;    Assessment / Plan / Recommendation Clinical Impression Patient is a 55 year old right-handed female with history of obesity with BMI 31.47, hypertension on no medications prior to admission, as well as remote history of tobacco use.  Presented to [facility] 12/02/2020  unwitnessed cardiac arrest. Per EMS she received 2 shocks by FD prior to EMS arrival. After EMS arrived she was noted to be in V. fib. She did receive 1 round of epi. Total downtime was unknown. On arrival to the ED her GCS was 3. She required intubation for airway protection.  CT/MRI negative for acute changes. Chest x-ray with no edema or airspace opacity.She was completing a course of Unasyn for aspiration pneumonia.EEG negative for seizure however it was suggestive of severe diffuse encephalopathyand patient remains on Keppra for seizure prophylaxis.. Echocardiogram with ejection fraction of 60 to 65% no wall motion abnormalitiesand no signs of right heart strain. Palliative care was consulted to  establish goals of care. Hospital course tracheostomy 12/13/2020 per Dr. Clyde Canterbury as well as placement of gastrostomy tube for nutritional support 12/13/2020 per Dr. Lucy Antigua currently remains n.p.o..Neurology follow-up for suspect anoxic brain injury with initial CT MRI unremarkable it was repeated 12/23/2020 findings consistent with anoxic brain injury. 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 Evolutionary changes likely related to evolving ischemia compared with prior MRI of 12/07/2020.Therapy evaluations completed due to patient's cognitive dysfunction and decreased functional mobility and recommendations are for a comprehensive rehab program. Patient admitted 05/10/21.  Patient currently has a #6 cuffed trach in which the cuff is currently inflated. Upon arrival, patient appeared awake but restless in bed. All vitals appeared [facility], therefore, the cuff was slowly deflated. Patient with one instance of coughing in which secretions were expelled from trach hub. Patient's upper airway appeared patent with finger occlusion, therefore, the PMSV was donned. Patient with intermittent coughing in which she expectorated secretions orally. Patient's vitals remained WFL without evidence of back pressure. However, as session progress and inflathe PMSV remained in place for longer intervals of time, increasing coughing and work of breathing noted with evidence of back pressure. The PMSV was removed and the cuff was re-inflated. Recommend patient wear PMSV with speech only. While the PMSV was donned, patient with minimal attempts to verbally communicate. When she did attempt, her speech appeared hoarse with a low vocal intensity with minimal intelligibility. Patient appeared restless in bed and demonstrated severe deficits in initiation and attention. Patient is currently not appropriate for a BSE and will remain NPO at this time. Patient would benefit from skilled  SLP intervention to maximize her  communication and cognitive functioning prior to discharge.    Skilled Therapeutic Interventions          Administered a PMSV eval and cognitive-linguistic evaluation, please see above for details.   SLP Assessment  Patient will need skilled Speech Lanaguage Pathology Services during CIR admission    Recommendations  Patient may use Passy-Muir Speech Valve: with SLP only PMSV Supervision: Full MD: Please consider changing trach tube to : Smaller size;Cuffless Oral Care Recommendations: Oral care QID Patient destination: Home Follow up Recommendations: 24 hour supervision/assistance;Home Health SLP Equipment Recommended: To be determined    SLP Frequency 3 to 5 out of 7 days   SLP Duration  SLP Intensity  SLP Treatment/Interventions 4 weeks  Minumum of 1-2 x/day, 30 to 90 minutes  Cognitive remediation/compensation;Internal/external aids;Speech/Language facilitation;Therapeutic Activities;Environmental controls;Cueing hierarchy;Functional tasks;Patient/family education    Pain No/Denies Pain   SLP Evaluation Cognition Overall Cognitive Status: Impaired/Different from baseline Arousal/Alertness: Awake/alert Attention: Focused Focused Attention: Impaired Focused Attention Impairment: Verbal basic Problem Solving: Impaired Problem Solving Impairment: Functional basic Executive Function:  (all impaired due to lower level deficits) Behaviors: Impulsive;Restless Safety/Judgment: Impaired  Comprehension Auditory Comprehension Overall Auditory Comprehension: Impaired Yes/No Questions: Impaired Basic Biographical Questions: 0-25% accurate Commands: Impaired One Step Basic Commands: 0-24% accurate Conversation: Simple Interfering Components: Attention;Processing speed;Working Curator: Not tested Reading Comprehension Reading Status: Not tested Expression Verbal Expression Overall Verbal  Expression: Impaired Initiation: Impaired Level of Generative/Spontaneous Verbalization: Word Pragmatics: Impairment Impairments: Abnormal affect;Eye contact Written Expression Dominant Hand: Right Written Expression: Not tested Oral Motor Oral Motor/Sensory Function Overall Oral Motor/Sensory Function: Generalized oral weakness (difficult to assess) Motor Speech Overall Motor Speech: Impaired Respiration: Impaired Level of Impairment: Word Phonation: Hoarse Intelligibility: Intelligibility reduced Word: 0-24% accurate Effective Techniques: Increased vocal intensity;Slow rate;Over-articulate  Care Tool Care Tool Cognition Expression of Ideas and Wants Expression of Ideas and Wants: Rarely/Never expressess or very difficult - rarely/never expresses self or speech is very difficult to understand   Understanding Verbal and Non-Verbal Content Understanding Verbal and Non-Verbal Content: Sometimes understands - understands only basic conversations or simple, direct phrases. Frequently requires cues to understand   Memory/Recall Ability *first 3 days only Memory/Recall Ability *first 3 days only: None of the above were recalled     PMSV Assessment  PMSV Trial PMSV was placed for: Intermittently for 40 minutes, at most in place for 10 minutes Able to redirect subglottic air through upper airway: Yes Able to Attain Phonation: No (very minimal attempts) Voice Quality: Hoarse Able to Expectorate Secretions: Yes Level of Secretion Expectoration with PMSV: Oral Breath Support for Phonation: Severely decreased Intelligibility: Intelligibility reduced Word: 0-24% accurate SpO2 During Trial: 98 % Pulse During Trial: 101 Behavior: Alert;No attempt to communicate;Confused  Short Term Goals: Week 1: SLP Short Term Goal 1 (Week 1): Patient will tolerate PMSV for 30 minutes with all vitals remaining WFL and without evidence of back pressure with supervision. SLP Short Term Goal 2 (Week 1):  Patient will vocalize at the word level in 50% of opportunities with Max A multimodal cues. SLP Short Term Goal 3 (Week 1): Patient will follow 1-step commands with Max A multimodal cues in 50% of opportunities. SLP Short Term Goal 4 (Week 1): Patient will focus attention to basic tasks for 2 minutes with Mod A multimodal cues.  Refer to Care Plan for Long Term Goals  Recommendations for other services: Neuropsych  Discharge Criteria: Patient will be discharged from SLP if patient refuses treatment 3 consecutive times without  medical reason, if treatment goals not met, if there is a change in medical status, if patient makes no progress towards goals or if patient is discharged from hospital.  The above assessment, treatment plan, treatment alternatives and goals were discussed and mutually agreed upon: No family available/patient unable  [employer], [place] 01/11/2021, 2:40 PM

## 2021-01-11 NOTE — Evaluation (Signed)
Occupational Therapy Assessment and Plan  Patient Details  Name: [name] [name] MRN: [id number] Date of Birth: 12-Jan-1966  OT Diagnosis: abnormal posture, apraxia, cognitive deficits, hemiplegia affecting dominant side and muscle weakness (generalized) Rehab Potential: Rehab Potential (ACUTE ONLY): Good ELOS: 4 weeks   Today's Date: 01/11/2021 OT Individual Time: 7341-9379 OT Individual Time Calculation (min): 60 min     Hospital Problem: Principal Problem:   Anoxic brain injury [facility])   Past Medical History:  Past Medical History:  Diagnosis Date  . Hypertension    Past Surgical History:  Past Surgical History:  Procedure Laterality Date  . PEG PLACEMENT N/A 12/13/2020   Procedure: PERCUTANEOUS ENDOSCOPIC GASTROSTOMY (PEG) PLACEMENT;  Surgeon: Lesly Rubenstein, MD;  Location: [facility] ENDOSCOPY;  Service: Endoscopy;  Laterality: N/A;  . TRACHEOSTOMY TUBE PLACEMENT N/A 12/13/2020   Procedure: TRACHEOSTOMY;  Surgeon: Clyde Canterbury, MD;  Location: [facility] ORS;  Service: ENT;  Laterality: N/A;    Assessment & Plan Clinical Impression: Patient is a 55 y.o. right-handed female with history of obesity with BMI 31.47, hypertension on no medications prior to admission as well as remote history of tobacco use.  History taken from chart review due to cognition.  Patient lives with spouse and her children.  Reportedly independent prior to admission.  Her children can help as needed.  Presented to [facility] on 12/02/2020 with witnessed cardiac arrest.  Per EMS she received 2 shocks by FD prior to EMS arrival.  After EMS arrived she was noted to be in ventricular fibrillation.  She did receive 1 round of epi.  Total downtime was unknown.  On arrival to the ED her GCS was 3.  She required intubation for airway protection.  Admission chemistries potassium 2.1, CO2 20, glucose 357, creatinine 1.01, AST 138, ALT 120, hemoglobin 9.7, WBC 11,900, hemoglobin A1c 5.9, troponin 71 and peaked at 900, lactic acid 10  urinalysis negative nitrite.  Code sepsis was initiated placed on ceftriaxone.  CT/MRI unremarkable for acute intracranial process..  Chest x-ray with no edema or airspace opacity.  She was completing a course of Unasyn for aspiration pneumonia.  EEG negative for seizures, however it was suggestive of severe diffuse encephalopathy and patient remains on Keppra for seizure prophylaxis.. Echocardiogram with ejection fraction of 60-65%, no wall motion abnormalities and no signs of right heart strain.  Palliative care was consulted to establish goals of care.  Hospital course tracheostomy 12/13/2020 per Dr. Clyde Canterbury as well as placement of gastrostomy tube for nutritional support on 12/13/2020 per Dr. Andrey Farmer and currently remains n.p.o.  Cardiology services follow-up for ischemic evaluation  post V. fib arrest Dr. Ida Rogue and no current plan for invasive ischemic evaluation at this time given suboptimal candidate in the setting of current comorbidity conditions... Neurology follow-up for suspect anoxic brain injury with initial CT MRI unremarkable it was repeated 12/23/2020 findings consistent with anoxic brain injury.  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  Evolutionary changes likely related to evolving ischemia compared with prior MRI of 12/07/2020.  Therapy evaluations completed due to patient's cognitive deficits and decreased functional mobility was admitted for a comprehensive rehab program.   Patient transferred to CIR on 01/10/2021 .    Patient currently requires max to total +2 with basic self-care skills secondary to muscle weakness, decreased cardiorespiratoy endurance and decreased oxygen support, impaired timing and sequencing, unbalanced muscle activation, motor apraxia, decreased coordination and decreased motor planning, decreased initiation, decreased attention, decreased  problem solving, decreased memory and delayed processing, and  decreased sitting balance, decreased standing balance, hemiplegia and decreased balance strategies.  Prior to hospitalization, patient could complete ADLs with independent .  Patient will benefit from skilled intervention to decrease level of assist with basic self-care skills prior to discharge home with care partner.  Anticipate patient will require 24 hour supervision and minimal physical assistance and follow up home health.  OT - End of Session Activity Tolerance: Tolerates 30+ min activity with multiple rests Endurance Deficit: Yes Endurance Deficit Description: pt required frequent rest breaks, decreased focused and sustained attention to task OT Assessment Rehab Potential (ACUTE ONLY): Good OT Barriers to Discharge: Other (comments) (unsure home setup or family support) OT Patient demonstrates impairments in the following area(s): Balance;Cognition;Endurance;Motor;Nutrition;Pain;Perception;Safety;Sensory;Skin Integrity;Vision OT Basic ADL's Functional Problem(s): Grooming;Bathing;Dressing;Toileting OT Transfers Functional Problem(s): Toilet OT Additional Impairment(s): Fuctional Use of Upper Extremity OT Plan OT Intensity: Minimum of 1-2 x/day, 45 to 90 minutes OT Frequency: 5 out of 7 days OT Duration/Estimated Length of Stay: 4 weeks OT Treatment/Interventions: Balance/vestibular training;Cognitive remediation/compensation;Discharge planning;Disease mangement/prevention;DME/adaptive equipment instruction;Functional mobility training;Neuromuscular re-education;Pain management;Patient/family education;Psychosocial support;Self Care/advanced ADL retraining;Skin care/wound managment;Splinting/orthotics;Therapeutic Activities;Therapeutic Exercise;UE/LE Strength taining/ROM;UE/LE Coordination activities;Visual/perceptual remediation/compensation;Wheelchair propulsion/positioning OT Basic Self-Care Anticipated Outcome(s): Min assist OT Toileting Anticipated Outcome(s): Mod assist OT Bathroom  Transfers Anticipated Outcome(s): Min assist OT Recommendation Patient destination: Home Follow Up Recommendations: Home health OT;24 hour supervision/assistance Equipment Recommended: 3 in 1 bedside comode;To be determined   OT Evaluation Precautions/Restrictions  Precautions Precautions: Fall Precaution Comments: Trach collar 5 L; PEG tube General   Vital Signs Therapy Vitals Temp: 98.5 F (36.9 C) Temp Source: Oral Pulse Rate: (!) 105 Resp: 20 BP: 130/80 Patient Position (if appropriate): Lying Pain Pain Assessment Pain Scale: 0-10 Faces Pain Scale: No hurt Home Living/Prior Functioning Home Living Living Arrangements: Spouse/significant other Available Help at Discharge: Family,Available 24 hours/day Type of Home: House Additional Comments: Per acute hospital stay - Pt's husband and grown children are very dedicated to her care. Pt was independent PTA.  No family avaiable for initial eval on CIR and pt nonverbal on trach and not yet assessed for PMSV therefore info obtained from acute chart Prior Function Level of Independence: Independent with basic ADLs,Independent with homemaking with ambulation,Independent with gait Driving: Yes Vocation: Other (Comment) (not working) Comments: Pt unable to provide PLOF and no family present for eval Vision Additional Comments: limited assessment due to decreased sustained attention and decreased initiation.  pt able to follow therapist with eyes and completed small amounts of tracking but unable to sustain attention to task Praxis Praxis: Impaired Praxis Impairment Details: Ideation;Ideomotor;Initiation Cognition Overall Cognitive Status: Impaired/Different from baseline Arousal/Alertness: Awake/alert Orientation Level: Nonverbal/unable to assess Attention: Focused Focused Attention: Impaired Focused Attention Impairment: Verbal basic;Functional basic Problem Solving: Impaired Problem Solving Impairment: Functional  basic Executive Function:  (all impaired due to lower level deficits) Behaviors: Impulsive;Restless Safety/Judgment: Impaired Comments: difficult to assess cognition and orientation as pt nonverbal during OT session due to not yet assessed for PMSV.  Pt with inconsistent nods yes/no during session. Sensation Sensation Proprioception: Impaired Detail Proprioception Impaired Details: Absent RUE Additional Comments: difficult to assess light touch as pt with delayed initiation to communicate sensation Coordination Gross Motor Movements are Fluid and Coordinated: No Fine Motor Movements are Fluid and Coordinated: No Finger Nose Finger Test: unable to complete due to motor apraxia and impaired initiation and following commands Motor  Motor Motor: Hemiplegia;Motor apraxia Motor - Skilled Clinical Observations: decreased initiation, motor planning, and functional  use of RUE > LUE   Balance Balance Balance Assessed: Yes Dynamic Sitting Balance Dynamic Sitting - Balance Support: During functional activity Dynamic Sitting - Level of Assistance: 4: Min assist;3: Mod assist Sitting balance - Comments: pt initially mod assist for sitting balance due to significant posterior lean and R lateral lean, but improved to CGA occasionally, overall min assist for sitting balance for safety Extremity/Trunk Assessment RUE Assessment RUE Assessment: Exceptions to [facility] Passive Range of Motion (PROM) Comments: WNL Active Range of Motion (AROM) Comments: pt was able to lift hand from abdomen ~20* elbow flexion but unable to replicate General Strength Comments: 1/5 LUE Assessment LUE Assessment: Exceptions to [facility] Passive Range of Motion (PROM) Comments: WNL Active Range of Motion (AROM) Comments: pt with limited initiation able to reach to bed rail but unable to lift UE on command. Able to maintain hand at cheek during washing after hand over hand.   Will need to further assess during functional  activity General Strength Comments: grossly 3-4/5  Care Tool Care Tool Self Care Eating  NPO      Oral Care    Oral Care Assist Level: Dependent - Patient 0%)    Bathing     Body parts bathed by helper: Right arm;Left arm;Chest;Abdomen;Front perineal area;Buttocks;Right upper leg;Left upper leg;Right lower leg;Left lower leg;Face   Assist Level: Dependent - Patient 0%    Upper Body Dressing(including orthotics)   What is the patient wearing?: Hospital gown only   Assist Level: Total Assistance - Patient < 25%    Lower Body Dressing (excluding footwear)   What is the patient wearing?: Incontinence brief Assist for lower body dressing: 2 Helpers    Putting on/Taking off footwear   What is the patient wearing?: Non-skid slipper socks Assist for footwear: Dependent - Patient 0%       Care Tool Toileting Toileting activity         Care Tool Bed Mobility Roll left and right activity   Roll left and right assist level: Moderate Assistance - Patient 50 - 74%    Sit to lying activity   Sit to lying assist level: Maximal Assistance - Patient 25 - 49%    Lying to sitting edge of bed activity   Lying to sitting edge of bed assist level: Maximal Assistance - Patient 25 - 49%     Care Tool Transfers Sit to stand transfer   Sit to stand assist level: 2 Helpers    Chair/bed transfer Chair/bed transfer activity did not occur: Safety/medical concerns       Toilet transfer Toilet transfer activity did not occur: Safety/medical concerns       Care Tool Cognition Expression of Ideas and Wants Expression of Ideas and Wants: Rarely/Never expressess or very difficult - rarely/never expresses self or speech is very difficult to understand   Understanding Verbal and Non-Verbal Content Understanding Verbal and Non-Verbal Content: Sometimes understands - understands only basic conversations or simple, direct phrases. Frequently requires cues to understand   Memory/Recall Ability  *first 3 days only Memory/Recall Ability *first 3 days only: None of the above were recalled    Refer to Care Plan for Long Term Goals  SHORT TERM GOAL WEEK 1 OT Short Term Goal 1 (Week 1): Pt will maintain unsupported sitting balance during self-care tasks for 5 mins with supevision OT Short Term Goal 2 (Week 1): Pt will complete UB bathing with mod assist OT Short Term Goal 3 (Week 1): Pt will complete UB dressing with  max assist OT Short Term Goal 4 (Week 1): Pt will complete toilet transfer with max assist of 1 caregiver  Recommendations for other services: None    Skilled Therapeutic Intervention OT eval completed with discussion of rehab process, OT purpose, POC, ELOS, and goals.  Therapist provided pt with wash cloth to wash face to initiate session.  Pt required hand over hand assist to engage in washing face, however unable to maintain focused attention to task to complete without assistance from therapist.  Pt moving BLE in bed and beginning to adjust in bed as therapist setup for self-care tasks, therefore therapist assisted pt with bed mobility to come to sitting EOB for ADL assessment.  Engaged in ADL assessment at EOB with focus on attention to task and participating in self-care tasks of bathing and dressing.  Pt with inconsistent use of LUE, however unable to engage in bathing with use of LUE even to raise arm or lift breast to allow therapist to wash.  Pt demonstrating posterior and R lean in unsupported sitting requiring up to mod assist, but occasionally able to maintain with CGA for a few seconds.  Engaged in sit > stand from EOB with pt demonstrating initiation with weight shift but still requiring mod assist +2 for sit > stand.  Attempted lateral scoot transfer bed > w/c with pt demonstrating initiation of weight shifting but unable to sequence or initiate in sequence with therapist to complete transfer to w/c despite +2 assist.  Pt returned to supine to complete LB bathing with  mod to +2 assist.  Towards end of session, pt would shake head "yes/no" with increased time however unsure of accuracy of answers.  Pt did demonstrate movement of RUE by lifting from stomach at end of session, however not purposeful and unable to replicate.  Pt remained semi-reclined in bed (HOB >30*) with all needs in reach.   ADL ADL Eating: NPO Grooming: Dependent Where Assessed-Grooming: Edge of bed Upper Body Bathing: Dependent Where Assessed-Upper Body Bathing: Edge of bed Lower Body Bathing: Dependent Where Assessed-Lower Body Bathing: Bed level Upper Body Dressing: Dependent Where Assessed-Upper Body Dressing: Edge of bed Lower Body Dressing: Dependent Where Assessed-Lower Body Dressing: Bed level Mobility  Bed Mobility Bed Mobility: Rolling Right;Rolling Left;Right Sidelying to Sit;Sit to Supine Rolling Right: Moderate Assistance - Patient 50-74% Rolling Left: Moderate Assistance - Patient 50-74% Right Sidelying to Sit: Moderate Assistance - Patient 50-74% Sit to Supine: Moderate Assistance - Patient 50-74% Transfers Sit to Stand: 2 Helpers Stand to Sit: 2 Helpers   Discharge Criteria: Patient will be discharged from OT if patient refuses treatment 3 consecutive times without medical reason, if treatment goals not met, if there is a change in medical status, if patient makes no progress towards goals or if patient is discharged from hospital.  The above assessment, treatment plan, treatment alternatives and goals were discussed and mutually agreed upon: No family available/patient unable  [place], [facility] 01/11/2021, 4:00 PM

## 2021-01-11 NOTE — Progress Notes (Signed)
PROGRESS NOTE   Subjective/Complaints: No complaints this morning Order placed for [facility] trials and she did well!  ROS: Unable to obtain due to cognitive deficits   Objective:   DG Abd 1 View  Result Date: 01/09/2021 CLINICAL DATA:  Vomiting. EXAM: ABDOMEN - 1 VIEW COMPARISON:  Abdominal x-ray dated December 31, 2020. FINDINGS: Gastrostomy tube again noted. The bowel gas pattern is normal. No radio-opaque calculi or other significant radiographic abnormality are seen. No acute osseous abnormality. IMPRESSION: Negative. Electronically Signed   By: Titus Dubin M.D.   On: 01/09/2021 15:40   Recent Labs    01/09/21 0551  WBC 7.6  HGB 11.0*  HCT 35.4*  PLT 228   Recent Labs    01/09/21 0551 01/10/21 0553  NA 144 142  K 3.1* 3.1*  CL 106 104  CO2 27 27  GLUCOSE 162* 148*  BUN 9 10  CREATININE 0.48 0.42*  CALCIUM 9.1 9.3    Intake/Output Summary (Last 24 hours) at 01/11/2021 1113 Last data filed at 01/11/2021 0318 Gross per 24 hour  Intake 571.08 ml  Output --  Net 571.08 ml        Physical Exam: Vital Signs Blood pressure 128/77, pulse (!) 108, temperature 98.9 F (37.2 C), resp. rate 18, height 5\' 4"  (1.626 m), weight 79 kg, SpO2 98 %. Gen: no distress, normal appearing HEENT: oral mucosa pink and moist,  + Trach with trach collar Cardio: Reg rate Chest: normal effort, normal rate of breathing Abdominal:     General: Abdomen is flat. There is no distension.     Comments: + Gastrostomy tube in place  Musculoskeletal:     Comments: No edema or tenderness in extremities  Skin:    General: Skin is warm and dry.  Neurological:     Mental Status: She is alert.     Comments: Alert Makes eye contact with examiner.   Motor: Limited due to participation, however spontaneously moving extremities, lower extremities > upper extremities, with?  Increased right upper extremity weakness  Psychiatric:      Comments: Unable to assess due to cognition    Assessment/Plan: 1. Functional deficits which require 3+ hours per day of interdisciplinary therapy in a comprehensive inpatient rehab setting.  Physiatrist is providing close team supervision and 24 hour management of active medical problems listed below.  Physiatrist and rehab team continue to assess barriers to discharge/monitor patient progress toward functional and medical goals  Care Tool:  Bathing              Bathing assist       Upper Body Dressing/Undressing Upper body dressing        Upper body assist      Lower Body Dressing/Undressing Lower body dressing            Lower body assist       Toileting Toileting    Toileting assist Assist for toileting: 2 Helpers     Transfers Chair/bed transfer  Transfers assist           Locomotion Ambulation   Ambulation assist              Walk  10 feet activity   Assist           Walk 50 feet activity   Assist           Walk 150 feet activity   Assist           Walk 10 feet on uneven surface  activity   Assist           Wheelchair     Assist               Wheelchair 50 feet with 2 turns activity    Assist            Wheelchair 150 feet activity     Assist          Blood pressure 128/77, pulse (!) 108, temperature 98.9 F (37.2 C), resp. rate 18, height 5\' 4"  (1.626 m), weight 79 kg, SpO2 98 %.  Medical Problem List and Plan: 1.  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  2.  Antithrombotics: -DVT/anticoagulation: SCDs             -antiplatelet therapy: N/A 3. Pain Management: Does not indicate or appear to be in pain and has scheduled oxycodone q8H- will decrease to q12H.  4. Mood: Klonopin 0.5 mg twice daily              -antipsychotic agents: Seroquel 25 mg nightly                         This medication is necessary. 5. Neuropsych: This patient is not capable of making decisions on her own behalf. 6. Skin/Wound Care: Routine skin checks 7. Fluids/Electrolytes/Nutrition: Routine in and outs.  CMP ordered 8.  Seizure prophylaxis.  Keppra 1000 mg twice daily.  EEG negative 9.  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  We will plan to decannulate if tolerated. 10.  Dysphagia with decreased nutritional storage.  Gastrostomy tube 12/13/2020 per Dr. Andrey Farmer.  Dietary follow-up.             Advance diet as tolerated 11.  Aspiration pneumonia.  5-day course of Unasyn completed. 12.  Hypertension.  Well controlled, continue Norvasc 10 mg daily, Lopressor 25 mg twice daily.  Patient would follow-up cardiology services.             Monitor with increased mobility 13.  Hyperlipidemia: Lipitor 14.  Morbid obesity.  BMI 31.47.  Dietary follow-up 15.  Prediabetes.  Hemoglobin A1c 5.9.  CBGs monitored while on tube feeds             Moderate increased 16. Tachycardic to 108: continue to monitor TID    LOS: 1 days A FACE TO FACE EVALUATION WAS PERFORMED  Ghada Abbett P Rashidah [place] 01/11/2021, 11:13 AM

## 2021-01-12 LAB — GLUCOSE, CAPILLARY
Glucose-Capillary: 147 mg/dL — ABNORMAL HIGH (ref 70–99)
Glucose-Capillary: 152 mg/dL — ABNORMAL HIGH (ref 70–99)
Glucose-Capillary: 171 mg/dL — ABNORMAL HIGH (ref 70–99)
Glucose-Capillary: 174 mg/dL — ABNORMAL HIGH (ref 70–99)
Glucose-Capillary: 181 mg/dL — ABNORMAL HIGH (ref 70–99)

## 2021-01-12 MED ORDER — OXYCODONE HCL 5 MG PO TABS
5.0000 mg | ORAL_TABLET | Freq: Every day | ORAL | Status: DC
  Administered 2021-01-13 – 2021-01-15 (×3): 5 mg
  Filled 2021-01-12 (×3): qty 1

## 2021-01-12 NOTE — Progress Notes (Signed)
PROGRESS NOTE   Subjective/Complaints: No complaints this morning K+ normalized yesterday  ROS: Unable to obtain due to cognitive deficits   Objective:   No results found. No results for input(s): WBC, HGB, HCT, PLT in the last 72 hours. Recent Labs    01/10/21 0553 01/11/21 1212  NA 142 142  K 3.1* 3.6  CL 104 104  CO2 27 21*  GLUCOSE 148* 189*  BUN 10 14  CREATININE 0.42* 0.53  CALCIUM 9.3 9.6    Intake/Output Summary (Last 24 hours) at 01/12/2021 1050 Last data filed at 01/12/2021 0850 Gross per 24 hour  Intake 1431.09 ml  Output 124 ml  Net 1307.09 ml        Physical Exam: Vital Signs Blood pressure (!) 163/96, pulse 99, temperature 98.6 F (37 C), temperature source Oral, resp. rate 18, height 5\' 4"  (1.626 m), weight 77.8 kg, SpO2 99 %.  Gen: no distress, normal appearing HEENT: oral mucosa pink and moist, + Trach with trach collar Cardio: Reg rate Chest: normal effort, normal rate of breathing Abdominal:     General: Abdomen is flat. There is no distension.     Comments: + Gastrostomy tube in place  Musculoskeletal:     Comments: No edema or tenderness in extremities  Skin:    General: Skin is warm and dry.  Neurological:     Mental Status: She is alert.     Comments: Alert Makes eye contact with examiner.   Motor: Limited due to participation, however spontaneously moving extremities, lower extremities > upper extremities, with?  Increased right upper extremity weakness  Psychiatric:     Comments: Unable to assess due to cognition     Assessment/Plan: 1. Functional deficits which require 3+ hours per day of interdisciplinary therapy in a comprehensive inpatient rehab setting.  Physiatrist is providing close team supervision and 24 hour management of active medical problems listed below.  Physiatrist and rehab team continue to assess barriers to discharge/monitor patient progress toward  functional and medical goals  Care Tool:  Bathing        Body parts bathed by helper: Right arm,Left arm,Chest,Abdomen,Front perineal area,Buttocks,Right upper leg,Left upper leg,Right lower leg,Left lower leg,Face     Bathing assist Assist Level: Dependent - Patient 0%     Upper Body Dressing/Undressing Upper body dressing   What is the patient wearing?: Hospital gown only    Upper body assist Assist Level: Total Assistance - Patient < 25%    Lower Body Dressing/Undressing Lower body dressing      What is the patient wearing?: Incontinence brief     Lower body assist Assist for lower body dressing: 2 Helpers     Toileting Toileting    Toileting assist Assist for toileting: 2 Helpers     Transfers Chair/bed transfer  Transfers assist  Chair/bed transfer activity did not occur: Safety/medical concerns        Locomotion Ambulation   Ambulation assist   Ambulation activity did not occur: Safety/medical concerns          Walk 10 feet activity   Assist  Walk 10 feet activity did not occur: Safety/medical concerns  Walk 50 feet activity   Assist Walk 50 feet with 2 turns activity did not occur: Safety/medical concerns         Walk 150 feet activity   Assist Walk 150 feet activity did not occur: Safety/medical concerns         Walk 10 feet on uneven surface  activity   Assist Walk 10 feet on uneven surfaces activity did not occur: Safety/medical concerns         Wheelchair     Assist Will patient use wheelchair at discharge?:  (TBD)             Wheelchair 50 feet with 2 turns activity    Assist            Wheelchair 150 feet activity     Assist          Blood pressure (!) 163/96, pulse 99, temperature 98.6 F (37 C), temperature source Oral, resp. rate 18, height 5\' 4"  (1.626 m), weight 77.8 kg, SpO2 99 %.  Medical Problem List and Plan: 1.  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  fib arrest/acute respiratory failure             -patient may not shower             -ELOS/Goals: 27-30 days/min/mod a             Continue CIR  2.  Antithrombotics: -DVT/anticoagulation: SCDs             -antiplatelet therapy: N/A 3. Pain Management:   2/19: Does not indicate or appear to be in pain and has scheduled oxycodone q8H- will decrease to q12H.  2/20:  Continues to not demonstrate pain. Therapy notes reviewed- no pain indicated during sessions. Decrease scheduled oxycodone to daily.  4. Mood: Continue Klonopin 0.5 mg twice daily             -antipsychotic agents: Seroquel 25 mg nightly                         This medication is necessary. 5. Neuropsych: This patient is not capable of making decisions on her own behalf. 6. Skin/Wound Care: Routine skin checks 7. Fluids/Electrolytes/Nutrition: Routine in and outs.  CMP ordered 8.  Seizure prophylaxis.  Keppra 1000 mg twice daily.  EEG negative 9.  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  We will plan to decannulate if tolerated. 10.  Dysphagia with decreased nutritional storage.  Gastrostomy tube 12/13/2020 per Dr. Andrey Farmer.  Dietary follow-up.             Advance diet as tolerated 11.  Aspiration pneumonia.  5-day course of Unasyn completed. 12.  Hypertension.  Well controlled, continue Norvasc 10 mg daily, Lopressor 25 mg twice daily.  Patient would follow-up cardiology services.             Monitor with increased mobility 13.  Hyperlipidemia: Lipitor 14.  Morbid obesity.  BMI 31.47.  Dietary follow-up 15.  Prediabetes.  Hemoglobin A1c 5.9.  CBGs monitored while on tube feeds             Moderate increased 16. Tachycardic:  2/20: up to 121 this morning, continue to monitor    LOS: 2 days A FACE TO FACE EVALUATION WAS PERFORMED  Martha Clan P Rodney Yera 01/12/2021, 10:50 AM

## 2021-01-12 NOTE — Plan of Care (Signed)
  Problem: Consults Goal: RH BRAIN INJURY PATIENT EDUCATION Description: Description: See Patient Education module for eduction specifics Outcome: Progressing   Problem: RH BOWEL ELIMINATION Goal: RH STG MANAGE BOWEL WITH ASSISTANCE Description: STG Manage Bowel with Assistance. Mod Outcome: Progressing   Problem: RH BLADDER ELIMINATION Goal: RH STG MANAGE BLADDER WITH ASSISTANCE Description: STG Manage Bladder With mod Assistance Outcome: Progressing   Problem: RH SAFETY Goal: RH STG ADHERE TO SAFETY PRECAUTIONS W/ASSISTANCE/DEVICE Description: STG Adhere to Safety Precautions With Assistance/Device. Mod assist Outcome: Progressing

## 2021-01-13 DIAGNOSIS — I10 Essential (primary) hypertension: Secondary | ICD-10-CM

## 2021-01-13 DIAGNOSIS — R1312 Dysphagia, oropharyngeal phase: Secondary | ICD-10-CM

## 2021-01-13 DIAGNOSIS — Z930 Tracheostomy status: Secondary | ICD-10-CM

## 2021-01-13 LAB — CBC WITH DIFFERENTIAL/PLATELET
Abs Immature Granulocytes: 0.02 10*3/uL (ref 0.00–0.07)
Basophils Absolute: 0 10*3/uL (ref 0.0–0.1)
Basophils Relative: 1 %
Eosinophils Absolute: 0.2 10*3/uL (ref 0.0–0.5)
Eosinophils Relative: 3 %
HCT: 38.2 % (ref 36.0–46.0)
Hemoglobin: 11.6 g/dL — ABNORMAL LOW (ref 12.0–15.0)
Immature Granulocytes: 0 %
Lymphocytes Relative: 27 %
Lymphs Abs: 2 10*3/uL (ref 0.7–4.0)
MCH: 26.4 pg (ref 26.0–34.0)
MCHC: 30.4 g/dL (ref 30.0–36.0)
MCV: 87 fL (ref 80.0–100.0)
Monocytes Absolute: 0.6 10*3/uL (ref 0.1–1.0)
Monocytes Relative: 9 %
Neutro Abs: 4.4 10*3/uL (ref 1.7–7.7)
Neutrophils Relative %: 60 %
Platelets: 218 10*3/uL (ref 150–400)
RBC: 4.39 MIL/uL (ref 3.87–5.11)
RDW: 20.3 % — ABNORMAL HIGH (ref 11.5–15.5)
WBC: 7.3 10*3/uL (ref 4.0–10.5)
nRBC: 0 % (ref 0.0–0.2)

## 2021-01-13 LAB — COMPREHENSIVE METABOLIC PANEL
ALT: 18 U/L (ref 0–44)
AST: 22 U/L (ref 15–41)
Albumin: 3 g/dL — ABNORMAL LOW (ref 3.5–5.0)
Alkaline Phosphatase: 77 U/L (ref 38–126)
Anion gap: 11 (ref 5–15)
BUN: 12 mg/dL (ref 6–20)
CO2: 27 mmol/L (ref 22–32)
Calcium: 9.6 mg/dL (ref 8.9–10.3)
Chloride: 106 mmol/L (ref 98–111)
Creatinine, Ser: 0.55 mg/dL (ref 0.44–1.00)
GFR, Estimated: >60 mL/min (ref >60)
Glucose, Bld: 187 mg/dL — ABNORMAL HIGH (ref 70–99)
Potassium: 3.6 mmol/L (ref 3.5–5.1)
Sodium: 144 mmol/L (ref 135–145)
Total Bilirubin: 0.3 mg/dL (ref 0.3–1.2)
Total Protein: 7.8 g/dL (ref 6.5–8.1)

## 2021-01-13 LAB — GLUCOSE, CAPILLARY
Glucose-Capillary: 141 mg/dL — ABNORMAL HIGH (ref 70–99)
Glucose-Capillary: 141 mg/dL — ABNORMAL HIGH (ref 70–99)
Glucose-Capillary: 142 mg/dL — ABNORMAL HIGH (ref 70–99)
Glucose-Capillary: 167 mg/dL — ABNORMAL HIGH (ref 70–99)
Glucose-Capillary: 171 mg/dL — ABNORMAL HIGH (ref 70–99)
Glucose-Capillary: 176 mg/dL — ABNORMAL HIGH (ref 70–99)

## 2021-01-13 NOTE — Progress Notes (Signed)
Inpatient Rehabilitation Care Coordinator Assessment and Plan Patient Details  Name: [name] [name] MRN: [id number] Date of Birth: [birth year]  Today's Date: 01/13/2021  Hospital Problems: Principal Problem:   Anoxic brain injury [facility])  Past Medical History:  Past Medical History:  Diagnosis Date  . Hypertension    Past Surgical History:  Past Surgical History:  Procedure Laterality Date  . PEG PLACEMENT N/A 12/13/2020   Procedure: PERCUTANEOUS ENDOSCOPIC GASTROSTOMY (PEG) PLACEMENT;  Surgeon: Lesly Rubenstein, MD;  Location: [facility] ENDOSCOPY;  Service: Endoscopy;  Laterality: N/A;  . TRACHEOSTOMY TUBE PLACEMENT N/A 12/13/2020   Procedure: TRACHEOSTOMY;  Surgeon: Clyde Canterbury, MD;  Location: [facility] ORS;  Service: ENT;  Laterality: N/A;   Social History:  reports that she quit smoking about 15 years ago. She has never used smokeless tobacco. She reports that she does not drink alcohol and does not use drugs.  Family / Support Systems Marital Status: Married How Long?: 35 years Patient Roles: Spouse,Parent Spouse/Significant Other: Marguerite Olea (husband): [phone] Children: 5 children; 23- 51 y.o, dtr and 1- 92 y.o. son in the home; other 3 children live in [place], [place] Other Supports: none reported Anticipated Caregiver: husband and children Ability/Limitations of Caregiver: Husband workds full time but able to work from home Caregiver Availability: 24/7 Family Dynamics: pt lives with husband and 2 children; pt works full time as well  Social History Preferred language: English Religion: None Cultural Background: Pt has been working full time at [employer]. 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 Recent Psychosocial Issues: Husband denies Psychiatric History: Husband denies Substance Abuse History: Husband denies  Patient / Family Perceptions, Expectations & Goals Pt/Family understanding of illness & functional limitations: Pt husband has a general understanding of care needs Premorbid pt/family roles/activities: Independent Anticipated changes in roles/activities/participation: Assistance with ADLs/IADLs Pt/family expectations/goals: For pt to be able to do as much as possible  [employer]: None Premorbid Home Care/DME Agencies: None Transportation available at discharge: Husband Resource referrals recommended: Neuropsychology  Discharge Planning Living Arrangements: Spouse/significant other,Children Support Systems: Spouse/significant other,Children Type of Residence: Private residence [employer] Resources: Teacher, adult education Resources: [place] Referred: Yes Living Expenses: Mortgage Money Management: Patient,Spouse Does the patient have any problems obtaining your medications?: No Home Management: All family members managed meals and housecleaning Patient/Family Preliminary Plans: No changes Care Coordinator Barriers to Discharge: Other (comments) Care Coordinator Barriers to Discharge Comments: Pt uninsured Care Coordinator Anticipated Follow Up Needs: HH/OP Expected length of stay: 3.5-4 weeks  Clinical Impression SW completed assessment with pt husband. SW introduced self, explained role, and discuss discharge process. No HCPOA. Husband Marguerite Olea has interim guardianship hearing on Thursday (2/24) and needs a letter to discuss pt current medical state and care needs. No DME. SW discussed with pt husband charity care and challenges  with having a trach and TF needs. SW informed will continue to observe if these are needed as time progresses. SW was present when financial reviewers came by room to assess pt. Husband required to provide documents to determine if pt is eligible for Medicaid. SW informed will follow-up after team conference tomorrow.   [name] [name] Born 01/13/2021, 2:25  PM

## 2021-01-13 NOTE — Progress Notes (Signed)
Physical Therapy Session Note  Patient Details  Name: [name] [name] MRN: [id number] Date of Birth: 02/23/1966  Today's Date: 01/13/2021 PT Individual Time: 1348-1500 PT Individual Time Calculation (min): 72 min   Short Term Goals: Week 1:  PT Short Term Goal 1 (Week 1): Pt will perform supine<>sit with mod assist of 1 consistently PT Short Term Goal 2 (Week 1): Pt will perform sit<>stands using LRAD with +2 max assist PT Short Term Goal 3 (Week 1): Pt will perform bed<>chair transfers using LRAD with +2 max assist PT Short Term Goal 4 (Week 1): Pt will tolerate standing at least 30 seconds with +2 max assist using LRAD  Skilled Therapeutic Interventions/Progress Updates:     Pt received supine in bed with husband present. Pt has very delayed responses to all questions and only responds via head nods. Pt nods head "yes" to therapy and does not indicate pain. Supine to sit with maxA. Pt is able to maintain sitting at EOB with minA +1. MaxA +2 for squat pivot transfer to [facility] toward L side. WC transport to gym. Pt attempts sit to stand transfer with +2 HHA but is unable to extend buttocks or knees fully, remaining forward flexed. Pt then attempts sit to stand with Stedy and is able to complete full upright posture with maxA +2. From elevated seated of Stedy, pt performs multiple reps of sit to stand during session, with mirror positioned for visual feedback. Assistance required to complete transfer decreases with each subsequent rep, eventually standing with modA +1. In standing, pt initially requires maxA +2 due to retropulsion but with verbal and tactile cues, and increased repetitions, pt improves to standing for short periods of time with CGA/minA  +1. Pt takes multiple extended seated rest breaks during session. Vitals monitored with pt on 5L O2 at 28% FiO2. Pt oxygen saturations remain between 99-100% and HR max of ~125 BPM. PT provides tour of facility following mobility training due to pt's  fatigue.   Squat pivot back to bed with maxA+2 and maxA +1 for sit to supine. Left supine in bed with alarm intact and all needs within reach.  Therapy Documentation Precautions:  Precautions Precautions: Fall,Other (comment) Precaution Comments: O2 via trach collar; PEG tube, delayed processing/initiation Restrictions Weight Bearing Restrictions: No RUE Weight Bearing: Non weight bearing   Therapy/Group: Individual Therapy  Breck Coons, PT, DPT 01/13/2021, 3:41 PM

## 2021-01-13 NOTE — Progress Notes (Signed)
[name], MD  Physician  Physical Medicine and Rehabilitation  PMR Pre-admission    Addendum  Date of Service:  01/10/2021 10:10 AM      Related encounter: ED to Hosp-Admission (Discharged) from 12/02/2020 in [facility]          Show:Clear all [x] Manual[x] Template[x] Copied  Added by: [x] Posey Pronto, Domenick Bookbinder, MD[x] Michel Santee, PT   [] Hover for details  PMR Admission Coordinator Pre-Admission Assessment  Patient: [name] [name] is an 55 y.o., female MRN: [id number] DOB: [birth year] Height: 5' 0.98" (154.9 cm) Weight: 77.2 kg                                                                                                                                                  Insurance Information Uninsured.  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  Presented to [facility] 12/02/2020  unwitnessed cardiac arrest. Per EMS she received 2 shocks by FD prior to EMS arrival. After EMS arrived she was noted to be in V. fib. She did receive 1 round of epi. Total downtime was unknown. On arrival to the ED her GCS was 3. She required intubation for airway protection. Admission chemistries potassium 2.1, CO2 20, glucose 357, creatinine 1.01, AST 138, ALT 120, hemoglobin 9.7, WBC  11,900,hemoglobin A1c 5.9,troponin 71and peaked at 900, lactic acid 10 urinalysis negative nitrite.Code sepsis was initiated placed on ceftriaxone. CT/MRI negative for acute changes. Chest x-ray with no edema or airspace opacity.She was completing a course of Unasyn for aspiration pneumonia.EEG negative for seizure however it was suggestive of severe diffuse encephalopathyand patient remains on Keppra for seizure prophylaxis.. Echocardiogram with ejection fraction of 60 to 65% no wall motion abnormalitiesand no signs of right heart strain. Palliative care was consulted to establish goals of care. Hospital course tracheostomy 12/13/2020 per Dr. Clyde Canterbury as well as placement of gastrostomy tube for nutritional support 12/13/2020 per Dr. Lucy Antigua currently remains n.p.o..Cardiology services follow-up for ischemic evaluation post V. fib arrestDr. Ida Rogue and no current plan for invasive ischemic evaluation at this time given suboptimal candidate in the setting of current comorbidity conditions.Neurology follow-up for suspect anoxic brain injury with initial CT MRI unremarkable it  was repeated 12/23/2020 findings consistent with anoxic brain injury. 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 Evolutionary changes likely related to evolving ischemia compared with prior MRI of 12/07/2020.Therapy evaluations completed due to patient's cognitive dysfunction and decreased functional mobility and recommendations are for a comprehensive rehab program. Glasgow Coma Scale Score: 11  Past Medical History      Past Medical History:  Diagnosis Date  . Hypertension     Family History  family history includes Diabetes in her mother; Healthy in her father; Hypertension in her mother; Leukemia in her mother.  Prior Rehab/Hospitalizations:  Has the patient had prior rehab or hospitalizations prior to admission? No  Has the patient had  major surgery during 100 days prior to admission? Yes  Current Medications   Current Facility-Administered Medications:  .  0.9 %  sodium chloride infusion (Manually program via Guardrails IV Fluids), , Intravenous, Once, Rust-Chester, Britton L, NP .  0.9 %  sodium chloride infusion, 250 mL, Intravenous, Continuous, Darel Hong D, NP, Last Rate: 10 mL/hr at 01/10/21 0341, Restarted at 01/10/21 0341 .  acetaminophen (TYLENOL) tablet 650 mg, 650 mg, Per Tube, Q8H PRN, Rust-Chester, Britton L, NP, 650 mg at 01/05/21 0051 .  alteplase (CATHFLO ACTIVASE) injection 2 mg, 2 mg, Intracatheter, Once, Kasa, Kurian, MD .  amLODipine (NORVASC) tablet 10 mg, 10 mg, Per Tube, Daily, Kasa, Kurian, MD, 10 mg at 01/09/21 1000 .  atorvastatin (LIPITOR) tablet 80 mg, 80 mg, Per Tube, Daily, Kasa, Kurian, MD, 80 mg at 01/09/21 1000 .  bisacodyl (DULCOLAX) suppository 10 mg, 10 mg, Rectal, Daily PRN, Sharion Settler, NP .  chlorhexidine gluconate (MEDLINE KIT) (PERIDEX) 0.12 % solution 15 mL, 15 mL, Mouth Rinse, BID, Kasa, Kurian, MD, 15 mL at 01/09/21 2152 .  Chlorhexidine Gluconate Cloth 2 % PADS 6 each, 6 each, Topical, Daily, Flora Lipps, MD, 6 each at 01/09/21 1018 .  clonazePAM (KLONOPIN) tablet 0.5 mg, 0.5 mg, Per Tube, BID, Danford, Suann Larry, MD, 0.5 mg at 01/09/21 2134 .  dextrose 5 % solution, , Intravenous, Continuous, Sharen Hones, MD, Paused at 01/10/21 [phone]  feeding supplement (OSMOLITE 1.5 CAL) liquid 1,000 mL, 1,000 mL, Per Tube, Q24H, Danford, Christopher P, MD .  feeding supplement (PROSource TF) liquid 45 mL, 45 mL, Per Tube, BID, Sharen Hones, MD, 45 mL at 01/09/21 2135 .  free water 30 mL, 30 mL, Per Tube, Q4H, Sharen Hones, MD, 30 mL at 01/10/21 0400 .  guaiFENesin (ROBITUSSIN) 100 MG/5ML solution 200 mg, 200 mg, Per Tube, Q4H PRN, Rust-Chester, Britton L, NP, 200 mg at 01/07/21 2124 .  hydrALAZINE (APRESOLINE) injection 10 mg, 10 mg, Intravenous, Q4H PRN, Nelle Don, MD, 10  mg at 12/29/20 2148 .  levETIRAcetam (KEPPRA) 100 MG/ML solution 1,000 mg, 1,000 mg, Per Tube, BID, Greta Doom, MD, 1,000 mg at 01/09/21 2138 .  MEDLINE mouth rinse, 15 mL, Mouth Rinse, 10 times per day, Flora Lipps, MD, 15 mL at 01/10/21 0635 .  metoCLOPramide (REGLAN) 5 MG/5ML solution 5 mg, 5 mg, Per Tube, TID AC, Danford, Suann Larry, MD, 5 mg at 01/09/21 1704 .  metoprolol tartrate (LOPRESSOR) tablet 25 mg, 25 mg, Per Tube, BID, Rockey Situ, Kathlene November, MD, 25 mg at 01/09/21 2134 .  midazolam (VERSED) injection 2 mg, 2 mg, Intravenous, Q1H PRN, Rust-Chester, Britton L, NP, 2 mg at 01/01/21 2245 .  multivitamin liquid 15 mL, 15 mL, Per Tube, Daily, Sharen Hones, MD, 15 mL at  01/09/21 1138 .  ondansetron (ZOFRAN) injection 4 mg, 4 mg, Intravenous, Q6H PRN, Sharion Settler, NP .  oxyCODONE (Oxy IR/ROXICODONE) immediate release tablet 5 mg, 5 mg, Per Tube, Q8H, Danford, Suann Larry, MD, 5 mg at 01/10/21 0629 .  polyethylene glycol (MIRALAX / GLYCOLAX) packet 17 g, 17 g, Per Tube, Daily, Benita Gutter, RPH, 17 g at 01/09/21 1001 .  potassium chloride (KLOR-CON) packet 40 mEq, 40 mEq, Oral, Once, Danford, Christopher P, MD .  potassium chloride SA (KLOR-CON) CR tablet 20 mEq, 20 mEq, Oral, QHS, Danford, Christopher P, MD .  QUEtiapine (SEROQUEL) tablet 25 mg, 25 mg, Per Tube, QHS, Flora Lipps, MD, 25 mg at 01/09/21 2134 .  senna-docusate (Senokot-S) tablet 1 tablet, 1 tablet, Per Tube, Daily, Benita Gutter, RPH, 1 tablet at 01/09/21 1001 .  sodium chloride flush (NS) 0.9 % injection 10-40 mL, 10-40 mL, Intracatheter, Q12H, Freda Jackson B, MD, 10 mL at 01/09/21 1019 .  sodium chloride flush (NS) 0.9 % injection 10-40 mL, 10-40 mL, Intracatheter, PRN, Freddi Starr, MD, 10 mL at 12/26/20 2150  Patients Current Diet:  Diet Order     None       Precautions / Restrictions Precautions Precautions: Fall Precaution Comments: Trach collar 5 L; PEG  tube Restrictions Weight Bearing Restrictions: No   Has the patient had 2 or more falls or a fall with injury in the past year?Yes  Prior Activity Level Community (5-7x/wk): fully independent, not working, driving, no DME used prior to admission  Prior Functional Level Prior Function Level of Independence: Independent Comments: Pt unable to provide PLOF; family to inform OT as able  Self Care: Did the patient need help bathing, dressing, using the toilet or eating?  Independent  Indoor Mobility: Did the patient need assistance with walking from room to room (with or without device)? Independent  Stairs: Did the patient need assistance with internal or external stairs (with or without device)? Independent  Functional Cognition: Did the patient need help planning regular tasks such as shopping or remembering to take medications? Independent  Home Assistive Devices / Equipment Home Assistive Devices/Equipment: None  Prior Device Use: Indicate devices/aids used by the patient prior to current illness, exacerbation or injury? None of the above  Current Functional Level Cognition  Overall Cognitive Status: Difficult to assess Difficult to assess due to: Tracheostomy Orientation Level: Intubated/Tracheostomy - Unable to assess General Comments: pt non-verbal but tracking with eyes at times; initially not shaking head yes/no beginning of session but then able to perform this towards end of session    Extremity Assessment (includes Sensation/Coordination)  Upper Extremity Assessment: RUE deficits/detail,LUE deficits/detail RUE Deficits / Details: Pt able to sqeeze hand on commands but unable to lift UE against gravity. PROM WFLs. Hand over hand for tasks LUE Deficits / Details: OT observed pt lift and bend at elbow but unable to perform on commands. No functional at this time. HOH for tasks  Lower Extremity Assessment: Defer to PT evaluation RLE Deficits / Details: able to  wiggle toes, ankle pump, AAROM for hip abduction/adduction, one rep AROM heel slides LLE Deficits / Details: able to wiggle toes, ankle pump, AAROM for hip abduction/adduction, able to  AROM heel slides independently    ADLs  Overall ADL's : Needs assistance/impaired Grooming: Wash/dry face,Sitting,Minimal assistance Grooming Details (indicate cue type and reason): min A to initiate and then pt performs Upper Body Dressing : Maximal assistance Upper Body Dressing Details (indicate cue type and reason): changing  gown- pushed L UE through sleeve    Mobility  Overal bed mobility: Needs Assistance Bed Mobility: Supine to Sit,Sit to Supine Supine to sit: HOB elevated,Max assist Sit to supine: Total assist,+2 for safety/equipment,+2 for physical assistance General bed mobility comments: assist for trunk and B LE's; vc's for technique    Transfers  Overall transfer level: Needs assistance Equipment used: Sliding board Transfers: Lateral/Scoot Transfers Sit to Stand: Max assist,+2 physical assistance,+2 safety/equipment  Lateral/Scoot Transfers: Max assist,+2 physical assistance General transfer comment: lateral scoot 1x to L along bed (used of bed sheet to assist); assist for balance d/t mild posterior lean    Ambulation / Gait / Stairs / Wheelchair Mobility  Ambulation/Gait General Gait Details: not appropriate at this time    Posture / Balance Dynamic Sitting Balance Sitting balance - Comments: pt initially max assist for sitting balance d/t significant posterior lean but improved to min assist occasionally (d/t R lateral lean at times) but otherwise CGA for safety Balance Overall balance assessment: Needs assistance Sitting-balance support: Feet supported Sitting balance-Leahy Scale: Poor Sitting balance - Comments: pt initially max assist for sitting balance d/t significant posterior lean but improved to min assist occasionally (d/t R lateral lean at times) but otherwise CGA  for safety Postural control: Right lateral lean Standing balance-Leahy Scale: Zero    Special needs/care consideration Oxygen 5L on 28% trach collar, Trach size 6m cuffed, Diabetic management yes and Behavioral consideration yes, BI     Previous Home Environment (from acute therapy documentation) Living Arrangements: Spouse/significant other,Children Available Help at Discharge: Family,Available 24 hours/day [place] Additional Comments: Pt's husband and grown children are very dedicated to her care. Pt was independent PTA.  Discharge Living Setting Plans for Discharge Living Setting: Patient's home Type of Home at Discharge: House Discharge Home Layout: Able to live on main level with bedroom/bathroom Discharge Home Access: Stairs to enter Entrance Stairs-Rails: None Entrance Stairs-Number of Steps: 2-3 (already looking into ramp) Discharge Bathroom Shower/Tub: Tub/shower unit Discharge Bathroom Toilet: Standard Discharge Bathroom Accessibility: Yes How Accessible: Accessible via walker Does the patient have any problems obtaining your medications?: No  Social/Family/Support Systems Patient Roles: Spouse,Parent (adult children) Anticipated Caregiver: spouse DMarguerite Oleais primary contact, has twin daughters and 1 will be moving in with them for 24/7 supervision/assist Anticipated Caregiver's Contact Information: DMarguerite [phone]/Limitations of Caregiver: n/a Caregiver Availability: 24/7 Discharge Plan Discussed with Primary Caregiver: Yes Is Caregiver In Agreement with Plan?: Yes Does Caregiver/Family have Issues with Lodging/Transportation while Pt is in Rehab?: No   Goals Patient/Family Goal for Rehab: PT/OT/SLP min/mod A  Expected length of stay: 20-24 days Additional Information: new trach, PEG, husband applying for guardianship so he can get DPOA and HCPOA Pt/Family Agrees to Admission and willing to participate:  Yes Program Orientation Provided & Reviewed with Pt/Caregiver Including Roles  & Responsibilities: Yes  Barriers to Discharge: Insurance for SNF coverage   Decrease burden of Care through IP rehab admission: n/a  Possible need for SNF placement upon discharge: Not anticipated.  Pt with excellent family support.   Patient Condition: [facility] CM, and spouse. I discussed via phone for inpatient rehabilitation assessment.  Patient will benefit from ongoing PT, OT and SLP, can actively participate in 3 hours of therapy a day 5 days of the week, and can make measurable gains during the admission.  Patient will also benefit from the coordinated team approach during an Inpatient Acute  Rehabilitation admission.  The patient will receive intensive therapy as well as Rehabilitation physician, nursing, social worker, and care management interventions.  Due to bladder management, bowel management, safety, skin/wound care, disease management, medication administration, pain management and patient education the patient requires 24 hour a day rehabilitation nursing.  The patient is currently mod to max +2 with mobility and basic ADLs.  Discharge setting and therapy post discharge at home with home health is anticipated.  Patient has agreed to participate in the Acute Inpatient Rehabilitation Program and will admit today.  Preadmission Screen Completed By:  Michel Santee, PT, DPT 01/10/2021 10:14 AM ______________________________________________________________________   Discussed status with Dr. Posey Pronto on 01/10/21 at  10:14 AM  and received approval for admission today.  Admission Coordinator:  Michel Santee, PT, DPT time 10:14 AM Sudie Grumbling 01/10/21          Revision History                                  Note Details  Author [name], MD File Time 01/10/2021 10:25 AM  Author Type Physician Status Addendum  Last Editor [name], MD Service Physical Medicine and [facility] # [id number] Admit Date 01/10/2021

## 2021-01-13 NOTE — Progress Notes (Signed)
PROGRESS NOTE   Subjective/Complaints: Lying in bed. A little restless. No distress.   ROS: Limited due to cognitive/behavioral    Objective:   No results found. Recent Labs    01/13/21 0537  WBC 7.3  HGB 11.6*  HCT 38.2  PLT 218   Recent Labs    01/11/21 1212 01/13/21 0537  NA 142 144  K 3.6 3.6  CL 104 106  CO2 21* 27  GLUCOSE 189* 187*  BUN 14 12  CREATININE 0.53 0.55  CALCIUM 9.6 9.6    Intake/Output Summary (Last 24 hours) at 01/13/2021 1156 Last data filed at 01/12/2021 1820 Gross per 24 hour  Intake 499.58 ml  Output --  Net 499.58 ml        Physical Exam: Vital Signs Blood pressure 140/87, pulse (!) 101, temperature 98.7 F (37.1 C), resp. rate 20, height 5\' 4"  (1.626 m), weight 79.5 kg, SpO2 93 %.  Constitutional: No distress . Vital signs reviewed. HEENT: EOMI, oral membranes moist Neck: #6 trach with a few dried secretions.  Cardiovascular: RRR without murmur. No JVD    Respiratory/Chest: CTA Bilaterally without wheezes or rales. Normal effort    GI/Abdomen: BS +, non-tender, non-distended, PEG in place, clean Ext: no clubbing, cyanosis, or edema Psych: flat, distracted Musculoskeletal:     Comments: No edema or tenderness in extremities  Skin:    General: Skin is warm and dry.  Neurological:     alert. Non-verbal. Inconsistently follows commands. Makes eye contact. May occasionally nod y/n appropriately. Does move all 4 ext       Assessment/Plan: 1. Functional deficits which require 3+ hours per day of interdisciplinary therapy in a comprehensive inpatient rehab setting.  Physiatrist is providing close team supervision and 24 hour management of active medical problems listed below.  Physiatrist and rehab team continue to assess barriers to discharge/monitor patient progress toward functional and medical goals  Care Tool:  Bathing        Body parts bathed by helper: Right  arm,Left arm,Chest,Abdomen,Front perineal area,Buttocks,Right upper leg,Left upper leg,Right lower leg,Left lower leg,Face     Bathing assist Assist Level: Dependent - Patient 0%     Upper Body Dressing/Undressing Upper body dressing   What is the patient wearing?: Pull over shirt    Upper body assist Assist Level: Dependent - Patient 0%    Lower Body Dressing/Undressing Lower body dressing      What is the patient wearing?: Incontinence brief,Pants     Lower body assist Assist for lower body dressing: 2 Helpers     Toileting Toileting    Toileting assist Assist for toileting: 2 Helpers (2 helpers to turn pt in bed)     Transfers Chair/bed transfer  Transfers assist  Chair/bed transfer activity did not occur: Safety/medical concerns        Locomotion Ambulation   Ambulation assist   Ambulation activity did not occur: Safety/medical concerns          Walk 10 feet activity   Assist  Walk 10 feet activity did not occur: Safety/medical concerns        Walk 50 feet activity   Assist Walk 50 feet with  2 turns activity did not occur: Safety/medical concerns         Walk 150 feet activity   Assist Walk 150 feet activity did not occur: Safety/medical concerns         Walk 10 feet on uneven surface  activity   Assist Walk 10 feet on uneven surfaces activity did not occur: Safety/medical concerns         Wheelchair     Assist Will patient use wheelchair at discharge?: No (Per PT long-term goals)             Wheelchair 50 feet with 2 turns activity    Assist            Wheelchair 150 feet activity     Assist          Blood pressure 140/87, pulse (!) 101, temperature 98.7 F (37.1 C), resp. rate 20, height 5\' 4"  (1.626 m), weight 79.5 kg, SpO2 93 %.  Medical Problem List and Plan: 1.  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  Antithrombotics: -DVT/anticoagulation: SCDs             -antiplatelet therapy: N/A 3. Pain Management:   2/19: Does not indicate or appear to be in pain and has scheduled oxycodone q8H- will decrease to q12H.  2/21:  Oxycodone decreased to daily 2/20--pain seems controlled.  4. Mood: Continue Klonopin 0.5 mg twice daily             -antipsychotic agents: Seroquel 25 mg nightly                         This medication is necessary. 5. Neuropsych: This patient is not capable of making decisions on her own behalf. 6. Skin/Wound Care: Routine skin checks 7. Fluids/Electrolytes/Nutrition: Routine in and outs.  CMP ordered 8.  Seizure prophylaxis.  Keppra 1000 mg twice daily.  EEG negative 9.  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    -  downsize to #4 trach cuffless 10.  Dysphagia with decreased nutritional storage.  Gastrostomy tube 12/13/2020 per Dr. Andrey Farmer.  Dietary follow-up.             Advance diet as tolerated 11.  Aspiration pneumonia.  5-day course of Unasyn completed. 12.  Hypertension.  Well controlled, continue Norvasc 10 mg daily, Lopressor 25 mg twice daily.  Patient would follow-up cardiology services.             controlled 2/21 14.  Morbid obesity.  BMI 31.47.  Dietary follow-up 15.  Prediabetes.  Hemoglobin A1c 5.9.  CBGs monitored while on tube feeds             Moderate increased 16. Tachycardic:  2/21 around 100 currently--monitor   LOS: 3 days A FACE TO FACE EVALUATION WAS PERFORMED  Meredith Staggers 01/13/2021, 11:56 AM

## 2021-01-13 NOTE — IPOC Note (Signed)
Overall Plan of Care [facility]) Patient Details Name: [name] [name] MRN: [id number] DOB: 20-May-1966  Admitting Diagnosis: Anoxic brain injury [facility])  Hospital Problems: Principal Problem:   Anoxic brain injury ([place])     Functional Problem List: Nursing Bladder,Bowel,Endurance,Medication Management,Nutrition,Pain,Safety,Skin Integrity  PT Balance,Perception,Behavior,Safety,Edema,Sensory,Endurance,Skin Integrity,Motor,Nutrition,Pain  OT Balance,Cognition,Endurance,Motor,Nutrition,Pain,Perception,Safety,Sensory,Skin Integrity,Vision  SLP Cognition,Linguistic  TR         Basic ADL's: OT Grooming,Bathing,Dressing,Toileting     Advanced  ADL's: OT       Transfers: PT Bed Mobility,Bed to [employer]  OT Toilet     Locomotion: PT Ambulation,Wheelchair Mobility,Stairs     Additional Impairments: OT Fuctional Use of Upper Extremity  SLP Communication,Social Cognition comprehension,expression Social Interaction,Problem Solving,Memory,Attention,Awareness  TR      Anticipated Outcomes Item Anticipated Outcome  Self Feeding    Swallowing      Basic self-care  Min assist  Toileting  Mod assist   Bathroom Transfers Min assist  Bowel/Bladder  decrease incontinent episodes  Transfers  min assist  Locomotion  min assist  Communication  Min A  Cognition  Min A  Pain  less than 4  Safety/Judgment  No skin breakdown, infection, or falls   Therapy Plan: PT Intensity: Minimum of 1-2 x/day ,45 to 90 minutes PT Frequency: 5 out of 7 days PT Duration Estimated Length of Stay: 3.5-4 weeks OT Intensity: Minimum of 1-2 x/day, 45 to 90 minutes OT Frequency: 5 out of 7 days OT Duration/Estimated Length of Stay: 4 weeks SLP Intensity: Minumum of 1-2 x/day, 30 to 90 minutes SLP Frequency: 3 to 5 out of 7 days SLP Duration/Estimated Length of Stay: 4 weeks   Due to the current state of emergency, patients may not be receiving their 3-hours of Medicare-mandated  therapy.   Team Interventions: Nursing Interventions Patient/Family Education,Bladder Management,Bowel Management,Disease Management/Prevention,Pain Management,Cognitive Remediation/Compensation,Dysphagia/Aspiration Precaution Training,Discharge Planning,Psychosocial Support  PT interventions Ambulation/gait training,Community Corporate treasurer re-education,Psychosocial support,Stair training,UE/LE Strength taining/ROM,Wheelchair propulsion/positioning,Balance/vestibular training,Discharge planning,Functional electrical stimulation,Pain management,Skin care/wound management,Therapeutic Activities,UE/LE Coordination activities,Cognitive remediation/compensation,Patient/family education,Disease management/prevention,Functional mobility training,Splinting/orthotics,Therapeutic Exercise,Visual/perceptual remediation/compensation  OT Interventions Balance/vestibular training,Cognitive remediation/compensation,Discharge planning,Disease Pharmacologist instruction,Functional mobility training,Neuromuscular re-education,Pain management,Patient/family education,Psychosocial support,Self Care/advanced ADL retraining,Skin care/wound managment,Splinting/orthotics,Therapeutic Activities,Therapeutic Exercise,UE/LE Strength taining/ROM,UE/LE Coordination activities,Visual/perceptual remediation/compensation,Wheelchair propulsion/positioning  SLP Interventions Cognitive remediation/compensation,Internal/external aids,Speech/Language facilitation,Therapeutic Activities,Environmental controls,Cueing hierarchy,Functional tasks,Patient/family education  TR Interventions    SW/CM Interventions Discharge Planning,Psychosocial Support,Patient/Family Education   Barriers to Discharge MD  Medical stability  Nursing Decreased caregiver support    PT Home environment access/layout,Trach,Neurogenic Bowel & Bladder,Incontinence,Nutrition means,New oxygen     OT Other (comments) (unsure home setup or family support)    SLP Decreased caregiver support,Trach    SW Other (comments) Pt uninsured   Team Discharge Planning: Destination: PT-Home ,OT- Home , SLP-Home Projected Follow-up: PT-Home health PT,24 hour supervision/assistance, OT-  Home health OT,24 hour supervision/assistance, SLP-24 hour supervision/assistance,Home Health SLP Projected Equipment Needs: PT-To be determined, OT- 3 in 1 bedside comode,To be determined, SLP-To be determined Equipment Details: PT- , OT-  Patient/family involved in discharge planning: PT- Patient unable/family or caregiver not available,  OT-Patient unable/family or caregiver not available, SLP-Patient unable/family or caregive not available  MD ELOS: 3-4 weeks Medical Rehab Prognosis:  Excellent Assessment: The patient has been admitted for CIR therapies with the diagnosis of anoxic BI. The team will be addressing functional mobility, strength, stamina, balance, safety, adaptive techniques and equipment, self-care, bowel and bladder mgt, patient and caregiver education, NMR, communication, swallowing, trach/stoma mgt, behavior. Goals have been set at min to mod assist with self-care, mobility,  and speech, swallowing, cognition.   Due to the current state of emergency, patients may not be receiving their 3 hours per day of Medicare-mandated therapy.    Meredith Staggers, MD, FAAPMR      See Team Conference Notes for weekly updates to the plan of care

## 2021-01-13 NOTE — Care Management (Signed)
[facility] Individual Statement of Services  Patient Name:  [name] [name]  Date:  01/13/2021  Welcome to the [facility].  Our goal is to provide you with an individualized program based on your diagnosis and situation, designed to meet your specific needs.  With this comprehensive rehabilitation program, you will be expected to participate in at least 3 hours of rehabilitation therapies Monday-Friday, with modified therapy programming on the weekends.  Your rehabilitation program will include the following services:  Physical Therapy (PT), Occupational Therapy (OT), Speech Therapy (ST), 24 hour per day rehabilitation nursing, Therapeutic Recreaction (TR), Psychology, Neuropsychology, Care Coordinator, Rehabilitation Medicine, Nutrition Services, Pharmacy Services and Other  Weekly team conferences will be held on Tuesdays to discuss your progress.  Your Inpatient Rehabilitation Care Coordinator will talk with you frequently to get your input and to update you on team discussions.  Team conferences with you and your family in attendance may also be held.  Expected length of stay: 3.5-4 weeks   Overall anticipated outcome: Minimal Assistance   Depending on your progress and recovery, your program may change. Your Inpatient Rehabilitation Care Coordinator will coordinate services and will keep you informed of any changes. Your Inpatient Rehabilitation Care Coordinator's name and contact numbers are listed  below.  The following services may also be recommended but are not provided by the [facility] will be made to provide these services after discharge if needed.  Arrangements include referral to agencies that provide these services.  Your insurance has been verified to be:  Uninsured  Your  primary doctor is:  [name] [name]  Pertinent information will be shared with your doctor and your insurance company.  Inpatient Rehabilitation Care Coordinator:  Cathleen Corti [phone] or ([phone]  Information discussed with and copy given to patient by: Rana Snare, 01/13/2021, 1:20 PM

## 2021-01-13 NOTE — Progress Notes (Signed)
Speech Language Pathology Daily Session Note  Patient Details  Name: [name] [name] MRN: [id number] Date of Birth: [birth year]  Today's Date: 01/13/2021 SLP Individual Time: 3254-9826 SLP Individual Time Calculation (min): 41 min  Short Term Goals: Week 1: SLP Short Term Goal 1 (Week 1): Patient will tolerate PMSV for 30 minutes with all vitals remaining WFL and without evidence of back pressure with supervision. SLP Short Term Goal 2 (Week 1): Patient will vocalize at the word level in 50% of opportunities with Max A multimodal cues. SLP Short Term Goal 3 (Week 1): Patient will follow 1-step commands with Max A multimodal cues in 50% of opportunities. SLP Short Term Goal 4 (Week 1): Patient will focus attention to basic tasks for 2 minutes with Mod A multimodal cues.  Skilled Therapeutic Interventions: Pt was seen for skilled ST targeting cognitive-linguistic goals. Pt was lethargic throughout session, requiring Min-Mod fluctuating levels of multimodal cueing to achieve and maintain appropriate arousal for participation. Cuff of trach was slightly inflated upon arrival, therefore SLP slowly deflated it in preparation to donn PMSV. Pt exhibited 2 instances of coughing after cuff deflation and mobilized small amount of secretions into oral cavity, although SLP had to provide Total A for management via oral suction. She wore PMSV for ~25 minutes of session with SpO2 ranging from 95-98. No evidence of air trapping noted when PMSV briefly removed throughout session. Pt achieved 1 instance of low intensity but true phonation with Max A cueing from SLP for deep breath in and exhale out to produce "ah". Pt's decreased attention and comprehension is also limiting voicing/verbalizing at this time. Therefore, focus shifted to functional tasks targeting sustained attention and following 1-step directions (although PMSV remained on). Pt unable to follow directions to assist with bed mobility, however she did  follow basic oral motor commands for SLP to assist with oral care via suction brush (opening mouth, closing lips around oral suction). Although pt attempted to follow command for lingual protrusion, tongue remained inside of her mouth. Mod-Max A verbal, visual, and intermittently tactile cues provided for focused attention throughout session. During attempts to elicit basic yes/no responses (verbal or head shake/nod), but was not responsive, only able to achieve eye contact with SLP. Prior to leaving room, PMSV was doffed, trach cuff was re-inflated to baseline state. Given that she tolerated cuff deflation for entirety of session, MD may wish to consider changing trach to cuffless and/or downsizing. Pt left laying in bed with alarm set and needs within reach. Continue per current plan of care.       Pain Pain Assessment Pain Scale: Faces Faces Pain Scale: No hurt  Therapy/Group: Individual Therapy  Arbutus Leas 01/13/2021, 7:25 AM

## 2021-01-13 NOTE — Progress Notes (Signed)
Occupational Therapy Session Note  Patient Details  Name: [name] [name] MRN: [id number] Date of Birth: 09-13-1966  Today's Date: 01/13/2021 OT Individual Time: 8546-2703 OT Individual Time Calculation (min): 60 min    Short Term Goals: Week 1:  OT Short Term Goal 1 (Week 1): Pt will maintain unsupported sitting balance during self-care tasks for 5 mins with supevision OT Short Term Goal 2 (Week 1): Pt will complete UB bathing with mod assist OT Short Term Goal 3 (Week 1): Pt will complete UB dressing with max assist OT Short Term Goal 4 (Week 1): Pt will complete toilet transfer with max assist of 1 caregiver  Skilled Therapeutic Interventions/Progress Updates:    Pt greeted semi-reclined in bed at 30 degrees. Pt with very flat affect and responded only to occasional head nods inconsistently to yes/no questions throughout session. OT handed pt wash cloth and cued her to wash her face. Pt needed hand over hand A to initiate, then was able to continue washing face. Hand over hand A to initiate washing LLE with L hand, then she also continued that task. OT placed wash cloth on R hand and she was able to assist slightly with washing peri-area, although R UE is weaker. Pt needed total A overall for LB bathing/dressing at bed level. Pt did follow one step commands to lift R LE, then LLE into pants and demonstrated some initaition to try to bridge to pull up pants. Bridging however was not functional to pull pants up, so pt completed rolling with Max A while OT managed pants total A. Supine to sit EOB with total A. Initially pt with retropulsion in sitting and had difficulty keeping feet on the floor 2/2 very poor body awareness. Eventually, OT able to get pt to anterior weight shift and maintain sitting balance enough for OT to assist with UB bathing and dressing. Overall, pt still needed total A as she completed maybe 10% of bathing with multimodal cues. Pt completed 5 sit<>stands at EOB with Total A  initially, progressing to max A. Pt with retropulsion in standing and facilitation at hips and trunk to try to weight shift. Pt returned to supine with total A and left semi-reclined with HOB at 30 degrees.   Therapy Documentation Precautions:  Precautions Precautions: Fall,Other (comment) Precaution Comments: O2 via trach collar; PEG tube, delayed processing/initiation Restrictions Weight Bearing Restrictions: No RUE Weight Bearing: Non weight bearing Pain:  no  Pain reported Therapy/Group: Individual Therapy  Valma Cava 01/13/2021, 9:24 AM

## 2021-01-14 LAB — GLUCOSE, CAPILLARY
Glucose-Capillary: 142 mg/dL — ABNORMAL HIGH (ref 70–99)
Glucose-Capillary: 157 mg/dL — ABNORMAL HIGH (ref 70–99)
Glucose-Capillary: 157 mg/dL — ABNORMAL HIGH (ref 70–99)
Glucose-Capillary: 178 mg/dL — ABNORMAL HIGH (ref 70–99)
Glucose-Capillary: 181 mg/dL — ABNORMAL HIGH (ref 70–99)
Glucose-Capillary: 184 mg/dL — ABNORMAL HIGH (ref 70–99)

## 2021-01-14 MED ORDER — METHYLPHENIDATE HCL 5 MG PO TABS
5.0000 mg | ORAL_TABLET | Freq: Two times a day (BID) | ORAL | Status: DC
  Filled 2021-01-14: qty 1

## 2021-01-14 MED ORDER — METHYLPHENIDATE HCL 5 MG PO TABS: 5.0000 mg | ORAL_TABLET | Freq: Two times a day (BID) | ORAL | Status: DC

## 2021-01-14 MED ORDER — METHYLPHENIDATE HCL 5 MG PO TABS
5.0000 mg | ORAL_TABLET | Freq: Two times a day (BID) | ORAL | Status: DC
  Administered 2021-01-14 – 2021-01-20 (×11): 5 mg
  Filled 2021-01-14 (×11): qty 1

## 2021-01-14 MED ORDER — SACCHAROMYCES BOULARDII 250 MG PO CAPS
250.0000 mg | ORAL_CAPSULE | Freq: Two times a day (BID) | ORAL | Status: DC
  Administered 2021-01-14 – 2021-02-04 (×43): 250 mg
  Filled 2021-01-14 (×43): qty 1

## 2021-01-14 MED ORDER — CHOLESTYRAMINE LIGHT 4 G PO PACK: 4.0000 g | PACK | Freq: Two times a day (BID) | ORAL | Status: DC

## 2021-01-14 NOTE — Progress Notes (Signed)
Physical Therapy Session Note  Patient Details  Name: [name] [name] MRN: [id number] Date of Birth: June 29, 1966  Today's Date: 01/14/2021 PT Individual Time: 1104-1200 PT Individual Time Calculation (min): 56 min   Short Term Goals: Week 1:  PT Short Term Goal 1 (Week 1): Pt will perform supine<>sit with mod assist of 1 consistently PT Short Term Goal 2 (Week 1): Pt will perform sit<>stands using LRAD with +2 max assist PT Short Term Goal 3 (Week 1): Pt will perform bed<>chair transfers using LRAD with +2 max assist PT Short Term Goal 4 (Week 1): Pt will tolerate standing at least 30 seconds with +2 max assist using LRAD  Skilled Therapeutic Interventions/Progress Updates:     Pt received supine in bed and nods agreement to therapy. No indication of pain. Supine to sit with maxA +1.Pt performs squat pivot transfer to WC with maxA +2. WC transport to gym for time management. Pt performs NMR for standing balance and upright activity tolerance, with use of standing frame. Pt able to achieve full upright posture with standing frame, with mirror positioned for visual feedback and multimodal cues for posture and bringing trunk to midline due to pt's truncal extensor tone. Pt is able to gradually improve overall posture and PT gradually decreases support provided by sling and pt cued to increase hip extensor engagement to remain upright. Pt demonstrates consistent effort to maintain hip extension. Several seated rest breaks taken during activity. PT also cues for head turns with pt able to perform ~50% of time. Pt performs ambulation with 3 musketeers technique and +3 for WC follow and oxygen tank management. Pt ambulates 10' but has difficulty achieving hip extension, requiring heavy assist at hips to keep upright. Pt is able to perform reciprocal gait pattern, however, with short bilateral stride lengths.  Squat pivot back to bed with maxA +2. Sit to supine with maxA +2 and left with alarm intact and  all needs within reach.  Therapy Documentation Precautions:  Precautions Precautions: Fall,Other (comment) Precaution Comments: O2 via trach collar; PEG tube, delayed processing/initiation Restrictions Weight Bearing Restrictions: No RUE Weight Bearing: Non weight bearing   Therapy/Group: Individual Therapy  Breck Coons, PT, DPT 01/14/2021, 4:37 PM

## 2021-01-14 NOTE — Progress Notes (Signed)
Patients trach changed to #4 shiley cuffless with two RT's. Good color change on CO2 and new trach ties secure. Patient tolerated well.

## 2021-01-14 NOTE — Progress Notes (Signed)
Speech Language Pathology Daily Session Note  Patient Details  Name: [name] [name] MRN: [id number] Date of Birth: 1966/01/31  Today's Date: 01/14/2021 SLP Individual Time: 0900-0955 SLP Individual Time Calculation (min): 55 min  Short Term Goals: Week 1: SLP Short Term Goal 1 (Week 1): Patient will tolerate PMSV for 30 minutes with all vitals remaining WFL and without evidence of back pressure with supervision. SLP Short Term Goal 2 (Week 1): Patient will vocalize at the word level in 50% of opportunities with Max A multimodal cues. SLP Short Term Goal 3 (Week 1): Patient will follow 1-step commands with Max A multimodal cues in 50% of opportunities. SLP Short Term Goal 4 (Week 1): Patient will focus attention to basic tasks for 2 minutes with Mod A multimodal cues.  Skilled Therapeutic Interventions: Skilled treatment session focused on communication and cognitive goals. Upon arrival, patient was incontinent of urine and required total A for self-care and bed mobility. SLP facilitated session by deflating the cuff and applying the PMSV. Patient tolerated the PMSV for ~40 minutes with all vials remaining WFL and without evidence of breath stacking. Recommend a smaller, cuffless trach. Despite Max A multimodal cues, patient unable to produce any vocalizations but did attempt to mouth word X 3. Patient attempted to reach for items from a field of 2 in 25% of opportunities but did follow commands during oral care. Patient left sitting upright in bed with PMSV removed, cuff deflated and bed alarm on. Continue with current plan of care.       Pain Pain Assessment Pain Scale: Faces Pain Score: 0-No pain Faces Pain Scale: No hurt  Therapy/Group: Individual Therapy  Trenice Mesa 01/14/2021, 10:14 AM

## 2021-01-14 NOTE — Progress Notes (Signed)
Physical Therapy Session Note  Patient Details  Name: [name] [name] MRN: [id number] Date of Birth: 1965-11-30  Today's Date: 01/14/2021 PT Individual Time: 0174-9449 PT Individual Time Calculation (min): 28 min   Short Term Goals: Week 1:  PT Short Term Goal 1 (Week 1): Pt will perform supine<>sit with mod assist of 1 consistently PT Short Term Goal 2 (Week 1): Pt will perform sit<>stands using LRAD with +2 max assist PT Short Term Goal 3 (Week 1): Pt will perform bed<>chair transfers using LRAD with +2 max assist PT Short Term Goal 4 (Week 1): Pt will tolerate standing at least 30 seconds with +2 max assist using LRAD  Skilled Therapeutic Interventions/Progress Updates:    Pt received in bed semi-sidelying on R with pt appearing that she had fidgeted/wiggled down in the bed. Pt nods "yes" in agreement to therapy session and initiates movement to R EOB. Received and maintained on 5L of O2 28% FiO2 via trach collar - HR 113bpm and SpO2 95% upon therapist arrival. Supine>sitting R EOB, HOB partially elevated, with max assist for trunk upright and pivoting hips towards EOB. Upon cuing pt able to initiate movement to scoot towards EOB but then unable to continue executing this task therefore requiring total assist. Sitting EOB, pt demonstrates improved upright/midline posture compared to at initial eval and is able to maintain this with close supervision. Pt demonstrates a "shaking" "tremor" type movement throughout her whole body that is a high velocity, low amplitude movement that is constant while in sitting though varies slightly in intensity, is visible by watching the sleeves of her shirt move - RN notified at end of session . Pt tolerated sitting EOB ~66minutes while working on 1-step command following to grasp an item on the table to her left and then reach across body to hand it to therapist on R side - demonstrates significant difficulty visually scanning and orienting to R side with  perseveration on placing object back on the table - requires hand-over-hand assist to initiate correct movement/task - continues to require significantly increased time to initiate a task. Sit>supine mod assist for BLE management onto bed. Total assist scoot towards HOB. At end of session, pt able to clearly mouth the word "water" - therapist educated pt on current NPO and swallowing restrictions. Pt left in R semi-sidelying with needs in reach, HOB elevated >30degrees, lines intact, and bed alarm on. HR 104bpm and SpO2 96%  Therapy Documentation Precautions:  Precautions Precautions: Fall,Other (comment) Precaution Comments: O2 via trach collar; PEG tube, delayed processing/initiation Restrictions Weight Bearing Restrictions: No RUE Weight Bearing: Non weight bearing  Pain:   Difficult to determine due to nonverbal and flat affect but no grimacing or indication of pain/discomfort noted. Does continue to have increased tone in R UE.   Therapy/Group: Individual Therapy  Tawana Scale , PT, DPT, CSRS  01/14/2021, 12:58 PM

## 2021-01-14 NOTE — Progress Notes (Signed)
Patient ID: [name] [name], female   DOB: 1966/05/01, 55 y.o.   MRN: [id number]  SW met with pt husband in room to provide updates from team conference, and ELOS 4 weeks. SW provided pt husband with letter for interim guardianship. He also states he has not been able to get requested documents to financial counselor as he had to work today. He intends to follow-up.  Loralee Pacas, MSW, [place] Office: [phone] Cell: [phone] Fax: [phone]

## 2021-01-14 NOTE — Progress Notes (Signed)
°   01/13/21 1936  Assess: MEWS Score  Pulse Rate (!) 114  Resp 18  SpO2 94 %  O2 Device Tracheostomy Collar  O2 Flow Rate (L/min) 5 L/min  FiO2 (%) 28 %  Assess: MEWS Score  MEWS Temp 0  MEWS Systolic 0  MEWS Pulse 2  MEWS RR 0  MEWS LOC 0  MEWS Score 2  MEWS Score Color Yellow  Assess: if the MEWS score is Yellow or Red  Were vital signs taken at a resting state? No  Focused Assessment No change from prior assessment  Early Detection of Sepsis Score *See Row Information* Low  MEWS guidelines implemented *See Row Information* No, vital signs rechecked  Document  Progress note created (see row info) Yes  Yellow MEWS documented by RT. RT just provided trach care, causing agitation to the pt. VS rechecked and Green MEWS recorded

## 2021-01-14 NOTE — Plan of Care (Signed)
  Problem: Consults Goal: RH BRAIN INJURY PATIENT EDUCATION Description: Description: See Patient Education module for eduction specifics Outcome: Progressing   Problem: RH BOWEL ELIMINATION Goal: RH STG MANAGE BOWEL WITH ASSISTANCE Description: STG Manage Bowel with Assistance. Mod Outcome: Progressing   Problem: RH BLADDER ELIMINATION Goal: RH STG MANAGE BLADDER WITH ASSISTANCE Description: STG Manage Bladder With mod Assistance Outcome: Progressing   Problem: RH SAFETY Goal: RH STG ADHERE TO SAFETY PRECAUTIONS W/ASSISTANCE/DEVICE Description: STG Adhere to Safety Precautions With Assistance/Device. Mod assist Outcome: Progressing   Problem: RH COGNITION-NURSING Goal: RH STG USES MEMORY AIDS/STRATEGIES W/ASSIST TO PROBLEM SOLVE Description: STG Uses Memory Aids/Strategies With Assistance to Problem Solve. Mod assist Outcome: Progressing

## 2021-01-14 NOTE — Progress Notes (Addendum)
PROGRESS NOTE   Subjective/Complaints: Pt in bed. No new issues reported. Still having loose/mushy stools    ROS: Limited due to cognitive/behavioral     Objective:   No results found. Recent Labs    01/13/21 0537  WBC 7.3  HGB 11.6*  HCT 38.2  PLT 218   Recent Labs    01/11/21 1212 01/13/21 0537  NA 142 144  K 3.6 3.6  CL 104 106  CO2 21* 27  GLUCOSE 189* 187*  BUN 14 12  CREATININE 0.53 0.55  CALCIUM 9.6 9.6    Intake/Output Summary (Last 24 hours) at 01/14/2021 1027 Last data filed at 01/13/2021 1828 Gross per 24 hour  Intake -  Output 1 ml  Net -1 ml        Physical Exam: Vital Signs Blood pressure (!) 129/93, pulse (!) 105, temperature 99.6 F (37.6 C), temperature source Oral, resp. rate 18, height 5\' 4"  (1.626 m), weight 74.3 kg, SpO2 95 %.  Constitutional: No distress . Vital signs reviewed. HEENT: EOMI, oral membranes moist Neck: #6 trach with a few dried secretions.  Cardiovascular: RRR without murmur. No JVD    Respiratory/Chest: CTA Bilaterally without wheezes or rales. Normal effort    GI/Abdomen: BS +, non-tender, non-distended, PEG in place, clean Ext: no clubbing, cyanosis, or edema Psych: flat, distracted Musculoskeletal:     Comments: No edema or tenderness in extremities  Skin:    General: Skin is warm and dry.  Neurological:     alert. Non-verbal. Inconsistently follows commands. Makes eye contact. May occasionally nod y/n appropriately. Does move all 4 ext       Assessment/Plan: 1. Functional deficits which require 3+ hours per day of interdisciplinary therapy in a comprehensive inpatient rehab setting.  Physiatrist is providing close team supervision and 24 hour management of active medical problems listed below.  Physiatrist and rehab team continue to assess barriers to discharge/monitor patient progress toward functional and medical goals  Care Tool:  Bathing         Body parts bathed by helper: Right arm,Left arm,Chest,Abdomen,Front perineal area,Buttocks,Right upper leg,Left upper leg,Right lower leg,Left lower leg,Face     Bathing assist Assist Level: Dependent - Patient 0%     Upper Body Dressing/Undressing Upper body dressing   What is the patient wearing?: Pull over shirt    Upper body assist Assist Level: Dependent - Patient 0%    Lower Body Dressing/Undressing Lower body dressing      What is the patient wearing?: Incontinence brief,Pants     Lower body assist Assist for lower body dressing: 2 Helpers     Toileting Toileting    Toileting assist Assist for toileting: 2 Helpers (2 helpers to turn pt in bed)     Transfers Chair/bed transfer  Transfers assist  Chair/bed transfer activity did not occur: Safety/medical concerns  Chair/bed transfer assist level: 2 Helpers     Locomotion Ambulation   Ambulation assist   Ambulation activity did not occur: Safety/medical concerns          Walk 10 feet activity   Assist  Walk 10 feet activity did not occur: Safety/medical concerns  Walk 50 feet activity   Assist Walk 50 feet with 2 turns activity did not occur: Safety/medical concerns         Walk 150 feet activity   Assist Walk 150 feet activity did not occur: Safety/medical concerns         Walk 10 feet on uneven surface  activity   Assist Walk 10 feet on uneven surfaces activity did not occur: Safety/medical concerns         Wheelchair     Assist Will patient use wheelchair at discharge?: No (Per PT long-term goals)             Wheelchair 50 feet with 2 turns activity    Assist            Wheelchair 150 feet activity     Assist          Blood pressure (!) 129/93, pulse (!) 105, temperature 99.6 F (37.6 C), temperature source Oral, resp. rate 18, height 5\' 4"  (1.626 m), weight 74.3 kg, SpO2 95 %.  Medical Problem List and Plan: 1.  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  Antithrombotics: -DVT/anticoagulation: SCDs             -antiplatelet therapy: N/A 3. Pain Management:   2/19: Does not indicate or appear to be in pain and has scheduled oxycodone q8H- will decrease to q12H.  2/21:  Oxycodone decreased to daily 2/20--pain seems controlled.  4. Mood: Continue Klonopin 0.5 mg twice daily             -antipsychotic agents: Seroquel 25 mg nightly                         This medication is necessary. 5. Neuropsych: This patient is not capable of making decisions on her own behalf.  -2/22 begin ritalin trial to help attention and initation 6. Skin/Wound Care: Routine skin checks 7. Fluids/Electrolytes/Nutrition: TF at 55cc/hr  -f/u lytes Thursday given loose stool 8.  Seizure prophylaxis.  Keppra 1000 mg twice daily.  EEG negative 9.  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    - requested downsize to #4 trach cuffless 2/21  10.  Dysphagia with decreased nutritional storage.  Gastrostomy tube 12/13/2020 per Dr. Andrey Farmer.  Dietary follow-up.             Advance diet as tolerated 11.  Aspiration pneumonia.  5-day course of Unasyn completed. 12.  Hypertension.  Well controlled, continue Norvasc 10 mg daily, Lopressor 25 mg twice daily.  Patient would follow-up cardiology services.             controlled 2/22 14.  Morbid obesity.  BMI 31.47.  Dietary follow-up 15.  Prediabetes.  Hemoglobin A1c 5.9.  CBGs monitored while on tube feeds             Moderate increased 16. Tachycardia:  2/22 HR consistently at least mid 90's  -nurse commented in conference that it increases when she's incontinent  -no need for increasing BB at this point. Continue to observe 17. Loose stool:  -likely d/t reglan and TF  -TF are continuous to avoid excessive  loose stooling  with bolus  -add probiotic. Won't use cholestyramine d/t concern of adhesion to PEG  LOS: 4 days A FACE TO FACE EVALUATION WAS PERFORMED  Meredith Staggers 01/14/2021, 10:27 AM

## 2021-01-14 NOTE — Patient Care Conference (Signed)
Inpatient RehabilitationTeam Conference and Plan of Care Update Date: 01/14/2021   Time: 10:31 AM    Patient Name: [name] [name]      Medical Record Number: [id number]  Date of Birth: [birth year] Sex: Female         Room/Bed: 4W13C/4W13C-01 Payor Info: Payor: /    Admit Date/Time:  01/10/2021  2:23 PM  Primary Diagnosis:  Anoxic brain injury [facility])  Hospital Problems: Principal Problem:   Anoxic brain injury [facility])    Expected Discharge Date: Expected Discharge Date:  (4 weeks)  Team Members Present: Physician leading conference: Dr. Alger Simons Care Coodinator Present: Loralee Pacas, LCSWA;Deitrich Steve Creig Hines, RN, BSN, Espino Nurse Present: Other (comment) Rodena Goldmann, RN) PT Present: Tereasa Coop, PT OT Present: Cherylynn Ridges, OT SLP Present: Weston Anna, SLP PPS Coordinator present : Gunnar Fusi, SLP     Current Status/Progress Goal Weekly Team Focus  Bowel/Bladder   Pt is incont x2, LBM 01/13/21  Pt will regain control of B/B  Q2h toileting   Swallow/Nutrition/ Hydration   NPO with PEG  TBD  anticipating will be ready for BSE soon   ADL's   Total A overall, +2 transfers  Supervision/min A  general strengthening, self-care retrainnig, transfers, OOB tolerance   Mobility   maxA bed mobility, maxA +2 to maxA +1 sit to stand with Stedy, maxA +2 squat pivot to [facility]  minA  communication, global strengthening, bed mobility, transfers, ambulation   Communication   Max-Total A  Min A  vocalization, answering yes/no questions, expressing wants/needs   Safety/Cognition/ Behavioral Observations  Max-Total A  Min A  attention, initiation, problem solving   Pain   Pt unable to indicate if she is in pain.  Pt will be free of pain  Assess pain qshift/prn   Skin   No signs of skin breakdown. Trach & ped sites both clean/dry/intact.  Pt will remain free of skin breakdown/infection  Assess skin qshift.     Discharge Planning:      Team Discussion: Anoxic BI from cardiac  arrest, order to downsize trach placed yesterday didn't happen, MD questioning why d/t no notes in chart. Will place another order today. Pain issues stable, BP under control. More responsive today, up to goal of 55 mL/hr for Osmolite. CBG's stable, HR tachy when moving. Incontinent B/B. Discharge home with husband, 2 children live in the home and able to assist.  Patient on target to meet rehab goals: Hand over hand assist for ADL's but has no awareness of where her body is located, sat EOB for 20 minutes. Squat pivot OOB +2 max assist, stand pivot with Stedy. Tolerates speaking valve well, cuff deflated, doesn't initiate, no awareness of where body is.  *See Care Plan and progress notes for long and short-term goals.   Revisions to Treatment Plan:  Discontinue trach.  Teaching Needs: Family education, medication management, skin/wound care, safety awareness, transfer training, gait training, endurance training, diabetes education.  Current Barriers to Discharge: Inaccessible home environment, Decreased caregiver support, Home enviroment access/layout, Incontinence, Lack of/limited family support, Medication compliance, Behavior and Nutritional means  Possible Resolutions to Barriers: Continue current medications, offer nutritional support when appropriate, offer emotional support to patient and family.     Medical Summary Current Status: anoxic brain injury after cardiac arrest. trach/PEG, apiration pneumonia, TF causing loose stool  Barriers to Discharge: Medical stability   Possible Resolutions to [employer] Focus: daily data assessment, rx diarrhea, trach mgt/wean, pain control   Continued Need for Acute Rehabilitation  Level of Care: The patient requires daily medical management by a physician with specialized training in physical medicine and rehabilitation for the following reasons: Direction of a multidisciplinary physical rehabilitation program to maximize functional  independence : Yes Medical management of patient stability for increased activity during participation in an intensive rehabilitation regime.: Yes Analysis of laboratory values and/or radiology reports with any subsequent need for medication adjustment and/or medical intervention. : Yes   I attest that I was present, lead the team conference, and concur with the assessment and plan of the team.   Cristi Loron 01/14/2021, 3:11 PM

## 2021-01-14 NOTE — Progress Notes (Signed)
Occupational Therapy Session Note  Patient Details  Name: [name] [name] MRN: [id number] Date of Birth: 27-Apr-1966  Today's Date: 01/14/2021 OT Individual Time: 1308-1406 OT Individual Time Calculation (min): 58 min   Short Term Goals: Week 1:  OT Short Term Goal 1 (Week 1): Pt will maintain unsupported sitting balance during self-care tasks for 5 mins with supevision OT Short Term Goal 2 (Week 1): Pt will complete UB bathing with mod assist OT Short Term Goal 3 (Week 1): Pt will complete UB dressing with max assist OT Short Term Goal 4 (Week 1): Pt will complete toilet transfer with max assist of 1 caregiver  Skilled Therapeutic Interventions/Progress Updates:    Pt greeted semi-reclined in bed awake with tube feed running. Requested tube feeds be unhooked for therapy. Pt not following commands as well today, and seemed more lethargic throughout session. Pt needed max A for bed mobility and mod A progressing to close supervision for static sitting balance. Worked on UB bathing/dressing at EOB with pt needing min up to mod A for dynamic sitting balance with increased task demand. Pt needed hand over hand to initiate bathing tasks, however she did try to integrate R UE with min cues. Pt needed total A for UB bathing/dressing at EOB. Worked on pt following commands to lift unilateral LE into pants. Max +2 sit<>stand, then total A to pull up pants. Max +2 squat-pivot over to standard wc. Pt brought to therapy gym and worked on visual scanning and reaching with BITS using large dots on black background. Pt able to track dots with her eyes with min cues, but more mod cues to locate in R quadrants. Pt then initiated lifting R or L UE, but was unable to reach to the board, requiring guided A from OT. OT changed parameters to have large numbers on the screen and had her try to name them. Pt tried to mouth numbers, but unable to turn on voice. Pt returned to room and transferred back to bed in similar  fashion. Pt left semi-reclined in bed with bed alarm on and needs met.   Therapy Documentation Precautions:  Precautions Precautions: Fall,Other (comment) Precaution Comments: O2 via trach collar; PEG tube, delayed processing/initiation Restrictions Weight Bearing Restrictions: No RUE Weight Bearing: Non weight bearing Pain:  no pain perceived during session  Therapy/Group: Individual Therapy  Valma Cava 01/14/2021, 2:11 PM

## 2021-01-15 LAB — GLUCOSE, CAPILLARY
Glucose-Capillary: 138 mg/dL — ABNORMAL HIGH (ref 70–99)
Glucose-Capillary: 149 mg/dL — ABNORMAL HIGH (ref 70–99)
Glucose-Capillary: 156 mg/dL — ABNORMAL HIGH (ref 70–99)
Glucose-Capillary: 162 mg/dL — ABNORMAL HIGH (ref 70–99)
Glucose-Capillary: 165 mg/dL — ABNORMAL HIGH (ref 70–99)
Glucose-Capillary: 169 mg/dL — ABNORMAL HIGH (ref 70–99)
Glucose-Capillary: 176 mg/dL — ABNORMAL HIGH (ref 70–99)

## 2021-01-15 MED ORDER — ALBUTEROL SULFATE (2.5 MG/3ML) 0.083% IN NEBU: 2.5000 mg | INHALATION_SOLUTION | Freq: Four times a day (QID) | RESPIRATORY_TRACT | Status: DC | PRN

## 2021-01-15 NOTE — Progress Notes (Signed)
Speech Language Pathology Daily Session Note  Patient Details  Name: JENYFER TRAWICK MRN: [id number] Date of Birth: 1966/09/17  Today's Date: 01/15/2021 SLP Individual Time: 1330-1410 SLP Individual Time Calculation (min): 40 min  Short Term Goals: Week 1: SLP Short Term Goal 1 (Week 1): Patient will tolerate PMSV for 30 minutes with all vitals remaining WFL and without evidence of back pressure with supervision. SLP Short Term Goal 2 (Week 1): Patient will vocalize at the word level in 50% of opportunities with Max A multimodal cues. SLP Short Term Goal 3 (Week 1): Patient will follow 1-step commands with Max A multimodal cues in 50% of opportunities. SLP Short Term Goal 4 (Week 1): Patient will focus attention to basic tasks for 2 minutes with Mod A multimodal cues.  Skilled Therapeutic Interventions: Skilled treatment session focused on communication goals. Upon arrival, patient was awake in bed. Patient appeared extremely restless in bed and needed to be repositioned in bed multiple times throughout session. SLP donned PMSV for ~35 minutes with vitals remaining WFL and without evidence of breath stacking. Patient with minimal attempts to communicate despite Max A multimodal cues but did verbalize (whisper) she wanted "chips" and "water." Patient unable to produce voicing during any automatic sequence.  Patient also followed commands in 50% of opportunities. Patient left upright in bed with alarm on and all needs within reach. Continue with current plan of care.      Pain No/Denies Pain   Therapy/Group: Individual Therapy  PAYNE, COURTNEY 01/15/2021, 3:10 PM

## 2021-01-15 NOTE — Progress Notes (Signed)
Occupational Therapy Session Note  Patient Details  Name: [name] [name] MRN: [id number] Date of Birth: 03-20-1966  Today's Date: 01/15/2021 OT Individual Time: 1440-1540 OT Individual Time Calculation (min): 60 min    Short Term Goals: Week 1:  OT Short Term Goal 1 (Week 1): Pt will maintain unsupported sitting balance during self-care tasks for 5 mins with supevision OT Short Term Goal 2 (Week 1): Pt will complete UB bathing with mod assist OT Short Term Goal 3 (Week 1): Pt will complete UB dressing with max assist OT Short Term Goal 4 (Week 1): Pt will complete toilet transfer with max assist of 1 caregiver  Skilled Therapeutic Interventions/Progress Updates:    1:1. Pt received in bed agreeable to OT with head nod. Pt completes rolling with increased time to initiate rolling in bed. Pt with no incontinence in brief. Pt completes supine to sit with MAX A. Pt completes squat pivot transfer with MAX A of 2 and manual placement of hands for facilitation of anterior weight shift. In tx gym, pt completes sit to stand in stedy with MAX A Of 2 to power up but good initiation with counting and rocking. In perched position pt able to maintain sitting balance with S-MIN A on stedy flaps with mirror in front of pt. Mirror became a distractor thoughout session with pt staring at herself. Pt reaches with HOH A and increased time to intiate grabbing horse shoe and hanging it on clothesline in front of mirror 2x. Intermittment A for release of horse shoe with LUE. RUE maintained WB positioning on stedy bar. Exited session with pt seated in bed, exit alarm on and call light in reach   Therapy Documentation Precautions:  Precautions Precautions: Fall,Other (comment) Precaution Comments: O2 via trach collar; PEG tube, delayed processing/initiation Restrictions Weight Bearing Restrictions: No RUE Weight Bearing: Non weight bearing General:   Vital Signs: Therapy Vitals Temp: 98.9 F (37.2 C) Temp  Source: Oral Pulse Rate: 96 Resp: 19 BP: 124/78 Patient Position (if appropriate): Lying Oxygen Therapy SpO2: 93 % O2 Device: Room Air O2 Flow Rate (L/min): 5 L/min FiO2 (%): 21 % Pain:   ADL: ADL Eating: NPO Grooming: Dependent Where Assessed-Grooming: Edge of bed Upper Body Bathing: Dependent Where Assessed-Upper Body Bathing: Edge of bed Lower Body Bathing: Dependent Where Assessed-Lower Body Bathing: Bed level Upper Body Dressing: Dependent Where Assessed-Upper Body Dressing: Edge of bed Lower Body Dressing: Dependent Where Assessed-Lower Body Dressing: Bed level Vision   Perception    Praxis   Exercises:   Other Treatments:     Therapy/Group: Individual Therapy  Tonny Branch 01/15/2021, 2:44 PM

## 2021-01-15 NOTE — Progress Notes (Signed)
Physical Therapy Session Note  Patient Details  Name: [name] [name] MRN: [id number] Date of Birth: 12-30-1965  Today's Date: 01/15/2021 PT Individual Time: 0600-4599 PT Individual Time Calculation (min): 55 min   Short Term Goals: Week 1:  PT Short Term Goal 1 (Week 1): Pt will perform supine<>sit with mod assist of 1 consistently PT Short Term Goal 2 (Week 1): Pt will perform sit<>stands using LRAD with +2 max assist PT Short Term Goal 3 (Week 1): Pt will perform bed<>chair transfers using LRAD with +2 max assist PT Short Term Goal 4 (Week 1): Pt will tolerate standing at least 30 seconds with +2 max assist using LRAD  Skilled Therapeutic Interventions/Progress Updates:     Pt received supine in bed and agrees to therapy. No indication of pain. Pt had slid down in bed with legs flexed and abducted in "frog leg" pose. PT assists pt in donning pants and socks with pt performing bilateral rolling in bed with maxA. Pt then performs supine to sit with modA/maxA and improved initiation of movement. Pt performs squat pivot transfer to WC with totalA +1. WC transport to gym for time management. In gym PT notes that pt's oxygen tank is empty. Vitals taken and O2 sats ~99% with HR ~100 on room air. PT retrieves new O2 tank and places pt on 6L at 28% FiO2. Pt performs multiple reps of sit to stand with Stedy, working on functional strength, motor planning, balance training and upright activity tolerance. Pt initially requires maxA +2 for transfer but improves with each subsequent rep and eventually able to achieve nearly fully erect stand with minA +1. Pt does requires modA to fully extend knee to remaining in slight knee and hip flexion. Once full upright posture achieved, pt practices maintaining posture with mirror positioned for visual feedback. Pt able to maintain standing for short periods of time with minA at hips, but fatigues after several seconds and begins flexing at trunk and hips. Pt performs >5  reps of transfer during session. Pt also demos initiation of transfer during session, frequently attempting to stand with requiring PT cues to do so. Vitals monitored and remain with O2 ~99% and HR up to 110. Pt performs squat pivot back to bed with maxA +2. Sit to supine with maxA +2. Left supine in bed with alarm intact and all needs within reach.  Therapy Documentation Precautions:  Precautions Precautions: Fall,Other (comment) Precaution Comments: O2 via trach collar; PEG tube, delayed processing/initiation Restrictions Weight Bearing Restrictions: No RUE Weight Bearing: Non weight bearing   Therapy/Group: Individual Therapy  Breck Coons, PT, DPT 01/15/2021, 3:37 PM

## 2021-01-15 NOTE — Progress Notes (Signed)
PROGRESS NOTE   Subjective/Complaints: Makes eye contact No verbal output   ROS: Limited due to cognitive/behavioral     Objective:   No results found. Recent Labs    01/13/21 0537  WBC 7.3  HGB 11.6*  HCT 38.2  PLT 218   Recent Labs    01/13/21 0537  NA 144  K 3.6  CL 106  CO2 27  GLUCOSE 187*  BUN 12  CREATININE 0.55  CALCIUM 9.6    Intake/Output Summary (Last 24 hours) at 01/15/2021 1519 Last data filed at 01/15/2021 0844 Gross per 24 hour  Intake 0 ml  Output --  Net 0 ml        Physical Exam: Vital Signs Blood pressure 124/78, pulse 96, temperature 98.9 F (37.2 C), temperature source Oral, resp. rate 19, height 5\' 4"  (1.626 m), weight 74.2 kg, SpO2 93 %.  Gen: no distress, normal appearing HEENT: oral mucosa pink and moist, #6 trach with a few dried secretions.  Cardio: Reg rate Chest: normal effort, normal rate of breathing Abd: soft, non-distended Ext: no edema Psych: flat, distracted Musculoskeletal:     Comments: No edema or tenderness in extremities  Skin:    General: Skin is warm and dry.  Neurological:     alert. Non-verbal. Inconsistently follows commands. Makes eye contact. May occasionally nod y/n appropriately. Does move all 4 ext    Assessment/Plan: 1. Functional deficits which require 3+ hours per day of interdisciplinary therapy in a comprehensive inpatient rehab setting.  Physiatrist is providing close team supervision and 24 hour management of active medical problems listed below.  Physiatrist and rehab team continue to assess barriers to discharge/monitor patient progress toward functional and medical goals  Care Tool:  Bathing        Body parts bathed by helper: Right arm,Left arm,Chest,Abdomen,Front perineal area,Buttocks,Right upper leg,Left upper leg,Right lower leg,Left lower leg,Face     Bathing assist Assist Level: Dependent - Patient 0%     Upper  Body Dressing/Undressing Upper body dressing   What is the patient wearing?: Pull over shirt    Upper body assist Assist Level: Dependent - Patient 0%    Lower Body Dressing/Undressing Lower body dressing      What is the patient wearing?: Incontinence brief,Pants     Lower body assist Assist for lower body dressing: 2 Helpers     Toileting Toileting    Toileting assist Assist for toileting: 2 Helpers (2 helpers to turn pt in bed)     Transfers Chair/bed transfer  Transfers assist  Chair/bed transfer activity did not occur: Safety/medical concerns  Chair/bed transfer assist level: 2 Helpers     Locomotion Ambulation   Ambulation assist   Ambulation activity did not occur: Safety/medical concerns  Assist level: 2 helpers (+3 with WC follow) Assistive device: Hand held assist Max distance: 10'   Walk 10 feet activity   Assist  Walk 10 feet activity did not occur: Safety/medical concerns  Assist level: 2 helpers (+3 with WC follow) Assistive device: Hand held assist   Walk 50 feet activity   Assist Walk 50 feet with 2 turns activity did not occur: Safety/medical concerns  Walk 150 feet activity   Assist Walk 150 feet activity did not occur: Safety/medical concerns         Walk 10 feet on uneven surface  activity   Assist Walk 10 feet on uneven surfaces activity did not occur: Safety/medical concerns         Wheelchair     Assist Will patient use wheelchair at discharge?: No (Per PT long-term goals)             Wheelchair 50 feet with 2 turns activity    Assist            Wheelchair 150 feet activity     Assist          Blood pressure 124/78, pulse 96, temperature 98.9 F (37.2 C), temperature source Oral, resp. rate 19, height 5\' 4"  (1.626 m), weight 74.2 kg, SpO2 93 %.  Medical Problem List and Plan: 1.  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  Antithrombotics: -DVT/anticoagulation: SCDs             -antiplatelet therapy: N/A 3. Pain Management:   2/19: Does not indicate or appear to be in pain and has scheduled oxycodone q8H- will decrease to q12H.  2/21:  Oxycodone decreased to daily 2/20--pain seems controlled.   2/23: d/c oxycodone 4. Mood: Continue Klonopin 0.5 mg twice daily             -antipsychotic agents: Seroquel 25 mg nightly                         This medication is necessary. 5. Neuropsych: This patient is not capable of making decisions on her own behalf.  -Continue ritalin trial to help attention and initation 6. Skin/Wound Care: Routine skin checks 7. Fluids/Electrolytes/Nutrition: TF at 55cc/hr  -f/u lytes Thursday given loose stool 8.  Seizure prophylaxis.  Keppra 1000 mg twice daily.  EEG negative 9.  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    - requested downsize to #4 trach cuffless 2/21  10.  Dysphagia with decreased nutritional storage.  Gastrostomy tube 12/13/2020 per Dr. Andrey Farmer.  Dietary follow-up.             Advance diet as tolerated 11.  Aspiration pneumonia.  5-day course of Unasyn completed. 12.  Hypertension.  Well controlled, continue Norvasc 10 mg daily, Lopressor 25 mg twice daily.  Patient would follow-up cardiology services.             controlled 2/22 14.  Morbid obesity.  BMI 31.47.  Dietary follow-up 15.  Prediabetes.  Hemoglobin A1c 5.9.  CBGs monitored while on tube feeds             Moderate increased 16. Tachycardia:  2/22 HR consistently at least mid 90's  -nurse commented in conference that it increases when she's incontinent  -no need for increasing BB at this point. Continue to observe 17. Loose stool:  -likely d/t reglan and TF  -TF are continuous to avoid excessive loose stooling with bolus  -add probiotic. Won't use cholestyramine d/t concern  of adhesion to PEG  LOS: 5 days A FACE TO FACE EVALUATION  WAS PERFORMED  Izora Ribas 01/15/2021, 3:19 PM

## 2021-01-15 NOTE — Progress Notes (Signed)
Patient ID: [name] [name], female   DOB: [birth year], 55 y.o.   MRN: [id number]  Pt Guardian Ad Litem Julio Alm [phone]. [phone]) came to visit pt today. Confirms interim guardianship hearing is tomorrow.   Loralee Pacas, MSW, [place] Office: [phone] Cell: [phone] Fax: [phone]

## 2021-01-16 LAB — GLUCOSE, CAPILLARY
Glucose-Capillary: 161 mg/dL — ABNORMAL HIGH (ref 70–99)
Glucose-Capillary: 161 mg/dL — ABNORMAL HIGH (ref 70–99)
Glucose-Capillary: 177 mg/dL — ABNORMAL HIGH (ref 70–99)
Glucose-Capillary: 164 mg/dL — ABNORMAL HIGH (ref 70–99)
Glucose-Capillary: 189 mg/dL — ABNORMAL HIGH (ref 70–99)

## 2021-01-16 MED ORDER — POLYETHYLENE GLYCOL 3350 17 G PO PACK: 17.0000 g | PACK | Freq: Every day | ORAL | Status: DC | PRN

## 2021-01-16 MED ORDER — CLONAZEPAM 0.5 MG PO TABS: 0.5000 mg | ORAL_TABLET | Freq: Every day | ORAL | Status: DC

## 2021-01-16 MED ORDER — SENNOSIDES-DOCUSATE SODIUM 8.6-50 MG PO TABS: 1.0000 | ORAL_TABLET | Freq: Every day | ORAL | Status: DC | PRN

## 2021-01-16 MED ORDER — FREE WATER
50.0000 mL | Status: DC
  Administered 2021-01-16 – 2021-01-24 (×47): 50 mL

## 2021-01-16 MED ORDER — OSMOLITE 1.5 CAL PO LIQD
1000.0000 mL | ORAL | Status: DC
  Administered 2021-01-16 – 2021-01-22 (×6): 1000 mL
  Filled 2021-01-16 (×2): qty 1000

## 2021-01-16 NOTE — Progress Notes (Signed)
Occupational Therapy Session Note  Patient Details  Name: [name] [name] MRN: [id number] Date of Birth: May 31, 1966  Today's Date: 01/16/2021 OT Individual Time: 7654-6503 OT Individual Time Calculation (min): 40 min    Short Term Goals: Week 1:  OT Short Term Goal 1 (Week 1): Pt will maintain unsupported sitting balance during self-care tasks for 5 mins with supevision OT Short Term Goal 2 (Week 1): Pt will complete UB bathing with mod assist OT Short Term Goal 3 (Week 1): Pt will complete UB dressing with max assist OT Short Term Goal 4 (Week 1): Pt will complete toilet transfer with max assist of 1 caregiver  Skilled Therapeutic Interventions/Progress Updates:    Pt greeted semi-reclined in bed awake. OT provided gentle stretching and ROM to R UE in bed, noted some increased tone in R UE. OT handed pt unweighted dowel rod to work on ROM and initiation with B UEs. Pt needed guided A from OT to achieve chest press, but at 1/2 the ROM. 4 sets of 10 1/2 chest press. Pt with difficulty achieving R forearm supination 2/2 tone requiring stretching and OT assist to maintain grasp on dowel rod for bicep curls. Pt with difficulty motor planning B UE task. Worked on sitting balance at EOB with max A to come to sitting and max A fading to min A for sitting balance. OT engaged pt in face washing task in sitting with verbal cues and min set-up A. Sit<>stand at EOB with mod/max A, then max A to take 1 sidestep along EOB toward HOB. Completed this sit<>stand and 1 sidestep 4 times to get high enough in the bed. Pt returned to supine with max A and left semi-reclined in bed with bed alarm on, call bell in reach, and needs met.   Therapy Documentation Precautions:  Precautions Precautions: Fall,Other (comment) Precaution Comments: O2 via trach collar; PEG tube, delayed processing/initiation Restrictions Weight Bearing Restrictions: No RUE Weight Bearing: Non weight bearing Pain: Pain Assessment Pain  Scale: Faces Faces Pain Scale: No hurt   Therapy/Group: Individual Therapy  Valma Cava 01/16/2021, 2:27 PM

## 2021-01-16 NOTE — Progress Notes (Signed)
Nutrition Follow-up  RD working remotely.  DOCUMENTATION CODES:   Not applicable  INTERVENTION:   - Recommend adjusting bowel regimen as pt is having multiple large type 7 bowel movements per day but is receiving scheduled miralax and senna  Continue tube feeding via G-tube: - Advance Osmolite 1.5 to new goal rate of 65 ml/hr (tube feeding can be held for up to 4 hours for therapies) - ProSource TF 45 ml BID - Free water flushes of 50 ml q 4 hours  Tube feeding regimen at new goal rate provides 2030 kcal, 104 grams of protein, and 991 ml of H2O.   Total free water with flushes: 1291 ml  - MVI with minerals daily per tube  NUTRITION DIAGNOSIS:   Inadequate oral intake related to dysphagia as evidenced by NPO status.  Ongoing  GOAL:   Patient will meet greater than or equal to 90% of their needs  Met via TF  MONITOR:   Diet advancement,Labs,Weight trends,TF tolerance,I & O's  REASON FOR ASSESSMENT:   Consult Enteral/tube feeding initiation and management,Assessment of nutrition requirement/status  ASSESSMENT:   55 year old female with PMH of HTN. Presented to [facility] on 12/02/20 with witnessed cardiac arrest. Pt required intubation for airway protection. Pt underwent tracheostomy on 12/13/20 as well as placement of G-tube. Pt remains NPO. Imaging studies consistent with anoxic brain injury. Admitted to CIR on 01/10/21.  Pt remains NPO with continuous tube feedings infusing via G-tube. Pt tolerating tube feeds well.  Weight trending down. CIR admit weight recorded as 78.4 kg. Current weight is 74 kg. This is a 4.4 kg weight loss (5.6%) in less than 1 week which is severe and significant for timeframe. RD will increase tube feeding rate to provide additional kcal and protein and prevent additional weight loss. Discussed plan with RN. Will continue to monitor weights and adjust TF regimen further if needed.  Noted pt having multiple type 7 bowel movements. Miralax and senna  are scheduled. RD to reach out to MD regarding adjusting bowel medications.  Medications reviewed and include: ritalin, MVI with minerals, miralax, klor-con, florastor, senna  Labs reviewed. CBG's: 138-169 x 24 hours  NUTRITION - FOCUSED PHYSICAL EXAM:  Unable to complete at this time. RD working remotely.  Diet Order:   Diet Order            Diet NPO time specified  Diet effective now                 EDUCATION NEEDS:   No education needs have been identified at this time  Skin:  Skin Assessment: Skin Integrity Issues: Incisions: neck  Last BM:  01/16/21 large type 7  Height:   Ht Readings from Last 1 Encounters:  01/10/21 5' 4" (1.626 m)    Weight:   Wt Readings from Last 1 Encounters:  01/16/21 74 kg    BMI:  Body mass index is 28 kg/m.  Estimated Nutritional Needs:   Kcal:  1700-1900  Protein:  85-100 grams  Fluid:  1.7-1.9 L    Gustavus Bryant, MS, RD, LDN Inpatient Clinical Dietitian Please see [facility] for contact information.

## 2021-01-16 NOTE — Progress Notes (Signed)
Physical Therapy Session Note  Patient Details  Name: [name] MRN: [id number] Date of Birth: Nov 24, 1965  Today's Date: 01/16/2021 PT Individual Time: 7858-8502 PT Individual Time Calculation (min): 72 min   Short Term Goals: Week 1:  PT Short Term Goal 1 (Week 1): Pt will perform supine<>sit with mod assist of 1 consistently PT Short Term Goal 2 (Week 1): Pt will perform sit<>stands using LRAD with +2 max assist PT Short Term Goal 3 (Week 1): Pt will perform bed<>chair transfers using LRAD with +2 max assist PT Short Term Goal 4 (Week 1): Pt will tolerate standing at least 30 seconds with +2 max assist using LRAD  Skilled Therapeutic Interventions/Progress Updates:     Pt received supine in bed and agrees to therapy, seeming to be more alert and attentive this session. Supine to sit with modA/maxA and improved rolling but difficulty with sequencing beyond rolling to R side. Pt stands with PT and maxA from EOB and tech assists to pull up pants. Stand step transfer to [facility] with maxA +2. WC transport to gym for time management. Squat pivot to mat table with mod/maxA +2. Pt able to maintain static sitting on mat with close supervision and cues on body mechanics and positioning. Pt performs multiple reps of sit to stand from mat, with PT providing assistance via sheet around pt's hips and tech supporting pt's trunk. PT gradually reduces assistance provided at hips and pt demonstrate ability to increase hip extension to maintain upright posture. Pt does fatigue quickly and requires frequent rest breaks. Pt also performs NMR for sitting balance, coming down onto R and L elbows, alternately, in partial sidelying, then returning to midline, with CGA. Pt also transitions to semi-reclined position on elbows and able to push up to seated with CGA/minA. Pt performs stand pivot transfer to tilt-in-space WC with maxA +2. Left in WC with alarm intact and all needs within reach.  Therapy  Documentation Precautions:  Precautions Precautions: Fall,Other (comment) Precaution Comments: O2 via trach collar; PEG tube, delayed processing/initiation Restrictions Weight Bearing Restrictions: No RUE Weight Bearing: Non weight bearing   Therapy/Group: Individual Therapy  Breck Coons, PT, DPT 01/16/2021, 12:59 PM

## 2021-01-16 NOTE — Progress Notes (Signed)
Occupational Therapy Session Note  Patient Details  Name: [name] [name] MRN: [id number] Date of Birth: [birth year]  Today's Date: 01/16/2021 OT Individual Time: 1033-1100 OT Individual Time Calculation (min): 27 min    Short Term Goals: Week 1:  OT Short Term Goal 1 (Week 1): Pt will maintain unsupported sitting balance during self-care tasks for 5 mins with supevision OT Short Term Goal 2 (Week 1): Pt will complete UB bathing with mod assist OT Short Term Goal 3 (Week 1): Pt will complete UB dressing with max assist OT Short Term Goal 4 (Week 1): Pt will complete toilet transfer with max assist of 1 caregiver  Skilled Therapeutic Interventions/Progress Updates:    Pt received tilted back in TIS. SatO2 at 94 to 95% on 5L 28% fiO2 throughout session. Seated in TIS, pt washed face with LUE, able to initiate to wash chin and R cheek after visual demonstration and min VCs, but req max HOH to wash remainder of face. Applied lotion to BUE with overall max A, able to grasp bottle with L hand and initiate rubbing lotion on RUE, but req total A to open/close bottle and to squeeze lotion and to incorporate RUE. To target sustained attention and BUE NMR in prep for improved ADL performance, reached for and grasped bean bags from various quadrants, pt able to reach and return several bean bags on L side, req min to mod A to initiate grasp. Unaware when dropped bean bag. Note L present gaze, able to track bean bag for several seconds when instructed with eyes, but unable to follow commands to select indicated target.  Pt left tilted in TIS with safety belt alarm engaged, call bell in reach, and all immediate needs met.    Therapy Documentation Precautions:  Precautions Precautions: Fall,Other (comment) Precaution Comments: O2 via trach collar; PEG tube, delayed processing/initiation Restrictions Weight Bearing Restrictions: No RUE Weight Bearing: Non weight bearing Pain: Pain Assessment Pain Scale:  Faces Faces Pain Scale: No hurt ADL: See Care Tool for more details.  Therapy/Group: Individual Therapy  Volanda Napoleon MS, OTR/L  01/16/2021, 12:26 PM

## 2021-01-16 NOTE — Progress Notes (Signed)
PROGRESS NOTE   Subjective/Complaints: No complaints this morning Makes good eye contact No verbal output Elevated BP  ROS: Limited due to cognitive/behavioral     Objective:   No results found. No results for input(s): WBC, HGB, HCT, PLT in the last 72 hours. No results for input(s): NA, K, CL, CO2, GLUCOSE, BUN, CREATININE, CALCIUM in the last 72 hours.  Intake/Output Summary (Last 24 hours) at 01/16/2021 1404 Last data filed at 01/16/2021 0817 Gross per 24 hour  Intake 0 ml  Output -  Net 0 ml        Physical Exam: Vital Signs Blood pressure (!) 154/94, pulse 98, temperature 98.2 F (36.8 C), resp. rate 18, height 5\' 4"  (1.626 m), weight 74 kg, SpO2 98 %.  Gen: no distress, normal appearing HEENT: oral mucosa pink and moist, NCAT Cardio: Reg rate Chest: normal effort, normal rate of breathing Abd: soft, non-distended Ext: no edema Psych: flat, distracted Musculoskeletal:     Comments: No edema or tenderness in extremities  Skin:    General: Skin is warm and dry.  Neurological:     alert. Non-verbal. Inconsistently follows commands. Makes eye contact. May occasionally nod y/n appropriately. Does move all 4 ext    Assessment/Plan: 1. Functional deficits which require 3+ hours per day of interdisciplinary therapy in a comprehensive inpatient rehab setting.  Physiatrist is providing close team supervision and 24 hour management of active medical problems listed below.  Physiatrist and rehab team continue to assess barriers to discharge/monitor patient progress toward functional and medical goals  Care Tool:  Bathing        Body parts bathed by helper: Right arm,Left arm,Chest,Abdomen,Front perineal area,Buttocks,Right upper leg,Left upper leg,Right lower leg,Left lower leg,Face     Bathing assist Assist Level: Dependent - Patient 0%     Upper Body Dressing/Undressing Upper body dressing   What is  the patient wearing?: Pull over shirt    Upper body assist Assist Level: Dependent - Patient 0%    Lower Body Dressing/Undressing Lower body dressing      What is the patient wearing?: Incontinence brief,Pants     Lower body assist Assist for lower body dressing: 2 Helpers     Toileting Toileting    Toileting assist Assist for toileting: 2 Helpers (2 helpers to turn pt in bed)     Transfers Chair/bed transfer  Transfers assist  Chair/bed transfer activity did not occur: Safety/medical concerns  Chair/bed transfer assist level: 2 Helpers     Locomotion Ambulation   Ambulation assist   Ambulation activity did not occur: Safety/medical concerns  Assist level: 2 helpers (+3 with WC follow) Assistive device: Hand held assist Max distance: 10'   Walk 10 feet activity   Assist  Walk 10 feet activity did not occur: Safety/medical concerns  Assist level: 2 helpers (+3 with WC follow) Assistive device: Hand held assist   Walk 50 feet activity   Assist Walk 50 feet with 2 turns activity did not occur: Safety/medical concerns         Walk 150 feet activity   Assist Walk 150 feet activity did not occur: Safety/medical concerns  Walk 10 feet on uneven surface  activity   Assist Walk 10 feet on uneven surfaces activity did not occur: Safety/medical concerns         Wheelchair     Assist Will patient use wheelchair at discharge?: No (Per PT long-term goals)             Wheelchair 50 feet with 2 turns activity    Assist            Wheelchair 150 feet activity     Assist          Blood pressure (!) 154/94, pulse 98, temperature 98.2 F (36.8 C), resp. rate 18, height 5\' 4"  (1.626 m), weight 74 kg, SpO2 98 %.  Medical Problem List and Plan: 1.  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  Antithrombotics: -DVT/anticoagulation: SCDs             -antiplatelet therapy: N/A 3. Pain Management:   2/19: Does not indicate or appear to be in pain and has scheduled oxycodone q8H- will decrease to q12H.  2/21:  Oxycodone decreased to daily 2/20--pain seems controlled.   2/23: d/c oxycodone 4. Mood: Continue Klonopin 0.5 mg twice daily- decrease to nightly.              -antipsychotic agents: Seroquel 25 mg nightly                         This medication is necessary. 5. Neuropsych: This patient is not capable of making decisions on her own behalf.  -Continue ritalin trial to help attention and initation 6. Skin/Wound Care: Routine skin checks 7. Fluids/Electrolytes/Nutrition: TF at 55cc/hr 8.  Seizure prophylaxis.  Keppra 1000 mg twice daily.  EEG negative 9.  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    - requested downsize to #4 trach cuffless 2/21  10.  Dysphagia with decreased nutritional storage.  Gastrostomy tube 12/13/2020 per Dr. Andrey Farmer.  Dietary follow-up.             Advance diet as tolerated 11.  Aspiration pneumonia.  5-day course of Unasyn completed. 12.  Hypertension.  Well controlled, continue Norvasc 10 mg daily, Lopressor 25 mg twice daily.  Patient would follow-up cardiology services.             controlled 2/22 14.  Morbid obesity.  BMI 31.47.  Dietary follow-up 15.  Prediabetes.  Hemoglobin A1c 5.9.  CBGs monitored while on tube feeds             Moderate increased 16. Tachycardia:  2/24: HR up to 104- continue to monitor TID  -nurse commented in conference that it increases when she's incontinent  -no need for increasing BB at this point. Continue to observe 17. Loose stool:  -likely d/t reglan and TF  -TF are continuous to avoid excessive loose stooling with bolus  -add probiotic. Won't use cholestyramine d/t concern of adhesion to PEG  -d/c stool softeners  LOS: 6 days A FACE TO FACE EVALUATION WAS  PERFORMED  [name] [name] 01/16/2021, 2:04 PM

## 2021-01-16 NOTE — Evaluation (Signed)
Speech Language Pathology Bedside Swallow Evaluation   Patient Details  Name: [name] [name] MRN: [id number] Date of Birth: [birth year]  SLP Diagnosis: Dysphagia  Rehab Potential: Fair ELOS: 4 weeks    Today's Date: 01/16/2021 SLP Individual Time: 5809-9833 SLP Individual Time Calculation (min): 40 min   Hospital Problem: Principal Problem:   Anoxic brain injury [facility])  Past Medical History:  Past Medical History:  Diagnosis Date  . Hypertension    Past Surgical History:  Past Surgical History:  Procedure Laterality Date  . PEG PLACEMENT N/A 12/13/2020   Procedure: PERCUTANEOUS ENDOSCOPIC GASTROSTOMY (PEG) PLACEMENT;  Surgeon: Lesly Rubenstein, MD;  Location: [facility] ENDOSCOPY;  Service: Endoscopy;  Laterality: N/A;  . TRACHEOSTOMY TUBE PLACEMENT N/A 12/13/2020   Procedure: TRACHEOSTOMY;  Surgeon: Clyde Canterbury, MD;  Location: [facility] ORS;  Service: ENT;  Laterality: N/A;    Assessment / Plan / Recommendation Clinical Impression Prior to trials, PMSV was donned and oral care performed. Patient consumed trials of small ice chips with delayed and impaired AP transit with poor awareness of bolus intermittently throughout trials. Because of this, patient required moderate verbal cues for swallow trigger. As trials progress, patient required increased time and cues for coordination of swallow. Recommend patient remain NPO with ongoing trials with SLP only to assess readiness for objective swallow assessment.    Skilled Therapeutic Interventions          Administered a BSE, please see above for details. Prior to administering trials, PMSV was donned. All vitals remained [facility] and patient demonstrated increased attempts to communicate, however, despite Max A multimodal cues, patient unable to vocalize on command. Patient's overall vocal quality appeared adequate during reflexive tasks such as throat clearing and coughing. Patient followed commands with extra time and Mod verbal and visual cues for  basic self-care tasks and smiled and laughed appropriately X 1. Recommend patient wear PMSV during all therapy and with full staff supervision. Patient left upright in bed with alarm on, PMSV removed and all needs within reach.    SLP Assessment  Patient will need skilled [facility] during CIR admission    Recommendations  SLP Diet Recommendations: NPO;Alternative means - long-term Medication Administration: Via alternative means Oral Care Recommendations: Oral care QID Patient destination: Home Follow up Recommendations: 24 hour supervision/assistance;Home Health SLP Equipment Recommended: To be determined    SLP Frequency 3 to 5 out of 7 days   SLP Duration  SLP Intensity  SLP Treatment/Interventions 4 weeks  Minumum of 1-2 x/day, 30 to 90 minutes  Dysphagia/aspiration precaution training;Therapeutic Activities;Environmental controls;Cueing hierarchy;Functional tasks;Patient/family education    Pain Pain Assessment Pain Scale: Faces Faces Pain Scale: No hurt  Care Tool Care Tool Cognition Expression of Ideas and Wants Expression of Ideas and Wants: Rarely/Never expressess or very difficult - rarely/never expresses self or speech is very difficult to understand   Understanding Verbal and Non-Verbal Content Understanding Verbal and Non-Verbal Content: Sometimes understands - understands only basic conversations or simple, direct phrases. Frequently requires cues to understand   Memory/Recall Ability *first 3 days only Memory/Recall Ability *first 3 days only: None of the above were recalled     Bedside Swallowing Assessment General Date of Onset: 12/02/20 Previous Swallow Assessment: N/A Diet Prior to this Study: NPO;PEG tube Temperature Spikes Noted: No Respiratory Status: Trach Trach Size and Type: #4;With PMSV in place;Uncuffed History of Recent Intubation: Yes Behavior/Cognition: Alert;Requires cueing;Distractible Oral Cavity - Dentition:  Adequate natural dentition Self-Feeding Abilities: Needs assist Patient Positioning: Upright in  bed Baseline Vocal Quality: Aphonic Volitional Cough: Cognitively unable to elicit Volitional Swallow: Unable to elicit  Ice Chips Ice chips: Impaired Presentation: Spoon Oral Phase Impairments: Reduced lingual movement/coordination;Poor awareness of bolus Oral Phase Functional Implications: Prolonged oral transit Thin Liquid Thin Liquid: Not tested Nectar Thick Nectar Thick Liquid: Not tested Honey Thick Honey Thick Liquid: Not tested Puree Puree: Not tested Solid Solid: Not tested BSE Assessment Risk for Aspiration Impact on safety and function: Severe aspiration risk Other Related Risk Factors: Tracheostomy;Decreased respiratory status;Cognitive impairment;Deconditioning  Short Term Goals: Week 1: SLP Short Term Goal 1 (Week 1): Patient will tolerate PMSV for 30 minutes with all vitals remaining WFL and without evidence of back pressure with supervision. SLP Short Term Goal 2 (Week 1): Patient will vocalize at the word level in 50% of opportunities with Max A multimodal cues. SLP Short Term Goal 3 (Week 1): Patient will follow 1-step commands with Max A multimodal cues in 50% of opportunities. SLP Short Term Goal 4 (Week 1): Patient will focus attention to basic tasks for 2 minutes with Mod A multimodal cues. SLP Short Term Goal 5 (Week 1): Patient will consume trials of ice chips with minimal overt s/s of aspiration and Min verbal cues for AP transit and swallow initation over 2 sessions to assess readiness for MBS.  Refer to Care Plan for Long Term Goals  Recommendations for other services: Neuropsych  Discharge Criteria: Patient will be discharged from SLP if patient refuses treatment 3 consecutive times without medical reason, if treatment goals not met, if there is a change in medical status, if patient makes no progress towards goals or if patient is discharged from  hospital.  The above assessment, treatment plan, treatment alternatives and goals were discussed and mutually agreed upon: No family available/patient unable  [place], [place] 01/16/2021, 1:30 PM

## 2021-01-17 LAB — GLUCOSE, CAPILLARY
Glucose-Capillary: 141 mg/dL — ABNORMAL HIGH (ref 70–99)
Glucose-Capillary: 141 mg/dL — ABNORMAL HIGH (ref 70–99)
Glucose-Capillary: 146 mg/dL — ABNORMAL HIGH (ref 70–99)
Glucose-Capillary: 161 mg/dL — ABNORMAL HIGH (ref 70–99)
Glucose-Capillary: 162 mg/dL — ABNORMAL HIGH (ref 70–99)
Glucose-Capillary: 163 mg/dL — ABNORMAL HIGH (ref 70–99)
Glucose-Capillary: 180 mg/dL — ABNORMAL HIGH (ref 70–99)

## 2021-01-17 MED ORDER — PROPRANOLOL HCL 10 MG PO TABS
10.0000 mg | ORAL_TABLET | Freq: Every day | ORAL | Status: DC
  Administered 2021-01-17 – 2021-01-27 (×8): 10 mg
  Filled 2021-01-17 (×11): qty 1

## 2021-01-17 MED ORDER — PROPRANOLOL HCL 10 MG PO TABS: 10.0000 mg | ORAL_TABLET | Freq: Every day | ORAL | Status: DC

## 2021-01-17 NOTE — Progress Notes (Signed)
Occupational Therapy Session Note  Patient Details  Name: [name] [name] MRN: [id number] Date of Birth: 11/10/1966  Today's Date: 01/17/2021 OT Individual Time: 1300-1400 OT Individual Time Calculation (min): 60 min   Short Term Goals: Week 1:  OT Short Term Goal 1 (Week 1): Pt will maintain unsupported sitting balance during self-care tasks for 5 mins with supevision OT Short Term Goal 2 (Week 1): Pt will complete UB bathing with mod assist OT Short Term Goal 3 (Week 1): Pt will complete UB dressing with max assist OT Short Term Goal 4 (Week 1): Pt will complete toilet transfer with max assist of 1 caregiver  Skilled Therapeutic Interventions/Progress Updates:    Pt greeted seated in TIS wc. Pt brought to the sink for bathing/dressing tasks. Worked on initiated UB bathing using B UE. Pt was able to push and pull wash cloth across body with R hand but had difficulty maintaining grasp. Pt needed hand over hand A to move on to next body part 2/2 perseveration. Max A for UB dressing with max cues to try to thread R UE first. Worked on sit<>stands at the sink with pt needing max A to get to standing, then +2 to maintain standing balance while OT assisted with washing buttocks and peri-area. Total A for LB ADLs. Pt brought to therapy gym and worked on [place]. Pt with some increased flexor tone, but able to decrease with guided towel slides for gentle stretching. Tried to have pt push towel with B UEs to locate colored block, but pt unable to sustain attention and follow commands to complete this task. Pt returned to room and left seated in wc with alarm belt on, family present, and needs met.   Therapy Documentation Precautions:  Precautions Precautions: Fall,Other (comment) Precaution Comments: O2 via trach collar; PEG tube, delayed processing/initiation Restrictions Weight Bearing Restrictions: Yes RUE Weight Bearing: Non weight bearing Pain:  denies pain  Therapy/Group: Individual  Therapy  Valma Cava 01/17/2021, 2:00 PM

## 2021-01-17 NOTE — Progress Notes (Signed)
PROGRESS NOTE   Subjective/Complaints: No complaints this morning Makes eye contact.  Bp elevated, pulse elevated, appears comfortable.  Has not demonstrated pain in therapy sessions  ROS: Limited due to cognitive/behavioral     Objective:   No results found. No results for input(s): WBC, HGB, HCT, PLT in the last 72 hours. No results for input(s): NA, K, CL, CO2, GLUCOSE, BUN, CREATININE, CALCIUM in the last 72 hours.  Intake/Output Summary (Last 24 hours) at 01/17/2021 1002 Last data filed at 01/17/2021 0900 Gross per 24 hour  Intake 1255.58 ml  Output --  Net 1255.58 ml        Physical Exam: Vital Signs Blood pressure (!) 143/102, pulse (!) 102, temperature 98.2 F (36.8 C), resp. rate 17, height 5\' 4"  (1.626 m), weight 75.5 kg, SpO2 99 %.  Gen: no distress, normal appearing HEENT: oral mucosa pink and moist, NCAT Cardio: Tachycardic Chest: normal effort, normal rate of breathing Abd: soft, non-distended Ext: no edema Psych: pleasant, normal affect Skin: intact Psych: flat, distracted Musculoskeletal:     Comments: No edema or tenderness in extremities  Neurological:     alert. Non-verbal. Inconsistently follows commands. Makes eye contact. May occasionally nod y/n appropriately. Does move all 4 ext    Assessment/Plan: 1. Functional deficits which require 3+ hours per day of interdisciplinary therapy in a comprehensive inpatient rehab setting.  Physiatrist is providing close team supervision and 24 hour management of active medical problems listed below.  Physiatrist and rehab team continue to assess barriers to discharge/monitor patient progress toward functional and medical goals  Care Tool:  Bathing        Body parts bathed by helper: Right arm,Left arm,Chest,Abdomen,Front perineal area,Buttocks,Right upper leg,Left upper leg,Right lower leg,Left lower leg,Face     Bathing assist Assist Level:  Dependent - Patient 0%     Upper Body Dressing/Undressing Upper body dressing   What is the patient wearing?: Pull over shirt    Upper body assist Assist Level: Dependent - Patient 0%    Lower Body Dressing/Undressing Lower body dressing      What is the patient wearing?: Incontinence brief,Pants     Lower body assist Assist for lower body dressing: 2 Helpers     Toileting Toileting    Toileting assist Assist for toileting: 2 Helpers (2 helpers to turn pt in bed)     Transfers Chair/bed transfer  Transfers assist  Chair/bed transfer activity did not occur: Safety/medical concerns  Chair/bed transfer assist level: 2 Helpers     Locomotion Ambulation   Ambulation assist   Ambulation activity did not occur: Safety/medical concerns  Assist level: 2 helpers (+3 with WC follow) Assistive device: Hand held assist Max distance: 10'   Walk 10 feet activity   Assist  Walk 10 feet activity did not occur: Safety/medical concerns  Assist level: 2 helpers (+3 with WC follow) Assistive device: Hand held assist   Walk 50 feet activity   Assist Walk 50 feet with 2 turns activity did not occur: Safety/medical concerns         Walk 150 feet activity   Assist Walk 150 feet activity did not occur: Safety/medical concerns  Walk 10 feet on uneven surface  activity   Assist Walk 10 feet on uneven surfaces activity did not occur: Safety/medical concerns         Wheelchair     Assist Will patient use wheelchair at discharge?: No (Per PT long-term goals)             Wheelchair 50 feet with 2 turns activity    Assist            Wheelchair 150 feet activity     Assist          Blood pressure (!) 143/102, pulse (!) 102, temperature 98.2 F (36.8 C), resp. rate 17, height 5\' 4"  (1.626 m), weight 75.5 kg, SpO2 99 %.  Medical Problem List and Plan: 1.  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  Antithrombotics: -DVT/anticoagulation: SCDs             -antiplatelet therapy: N/A 3. Pain Management:   2/19: Does not indicate or appear to be in pain and has scheduled oxycodone q8H- will decrease to q12H.  2/21:  Oxycodone decreased to daily 2/20--pain seems controlled.   2/23: d/c oxycodone 4. Mood: Demonstrates no anxiety or agitation, has been alert during the day, d/c Klonopin.               -antipsychotic agents: Seroquel 25 mg nightly                         This medication is necessary. 5. Neuropsych: This patient is not capable of making decisions on her own behalf.  -Continue ritalin trial to help attention and initation 6. Skin/Wound Care: Routine skin checks 7. Fluids/Electrolytes/Nutrition: TF at 55cc/hr 8.  Seizure prophylaxis.  Keppra 1000 mg twice daily.  EEG negative 9.  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    - requested downsize to #4 trach cuffless 2/21  10.  Dysphagia with decreased nutritional storage.  Gastrostomy tube 12/13/2020 per Dr. Andrey Farmer.  Dietary follow-up.             Advance diet as tolerated 11.  Aspiration pneumonia.  5-day course of Unasyn completed. 12.  Hypertension.  Well controlled, continue Norvasc 10 mg daily, Lopressor 25 mg twice daily.  Patient would follow-up cardiology services.             controlled 2/22 14.  Morbid obesity.  BMI 31.47.  Dietary follow-up 15.  Prediabetes.  Hemoglobin A1c 5.9.  CBGs monitored while on tube feeds             Moderate increased 16. Tachycardia:  2/25: HR up to 120- BP also high. Add propanolol 10mg  daily.  17. Loose stool:  -likely d/t reglan and TF  -TF are continuous to avoid excessive loose stooling with bolus  -add probiotic. Won't use cholestyramine d/t concern of adhesion to PEG  -d/c stool softeners  LOS: 7 days A FACE TO FACE  EVALUATION WAS PERFORMED  Nuh Lipton P Iyanna Drummer 01/17/2021, 10:02 AM

## 2021-01-17 NOTE — Progress Notes (Signed)
Physical Therapy Session Note  Patient Details  Name: [name] [name] MRN: [id number] Date of Birth: 04-03-1966  Today's Date: 01/17/2021 PT Individual Time: 6546-5035 and 1446-1530 PT Individual Time Calculation (min): 43 min and 44 min  Short Term Goals: Week 1:  PT Short Term Goal 1 (Week 1): Pt will perform supine<>sit with mod assist of 1 consistently PT Short Term Goal 2 (Week 1): Pt will perform sit<>stands using LRAD with +2 max assist PT Short Term Goal 3 (Week 1): Pt will perform bed<>chair transfers using LRAD with +2 max assist PT Short Term Goal 4 (Week 1): Pt will tolerate standing at least 30 seconds with +2 max assist using LRAD  Skilled Therapeutic Interventions/Progress Updates:      1st Session: Pt received supine in bed and agrees to therapy. PT dons PM valve but pt does not vocalize during session. No indication of pain. Supine to sit with modA. Pt performs stand step transfer to [facility] with heavy modA +1. WC transport to gym for time management. Pt performs multiple reps of sit to stand in parallel bars with modA +1. Level of manual assistance decreases with each subsequent rep of sit to stand. Pt progresses to marching in place during stands, with cues for upright gaze to improve posture and balance. Pt requires totalA each bout to scoot back into tilt in space WC. Pt is able to stand up to ~1 minute each bout. Following session, pt left seated in [facility] with all needs within reach and alarm intact.  2nd Session:  Pt received seated in [facility] and agrees to therapy. WC transport to gym for time management. Pt performs sit to stand with modA +1 in parallel bars. Pt progresses to ambulating in parallel bars 3x5' with extended seated rest breaks in between. PT provides modA for ambulation and manually assist to progress bilateral lower extremities due to pt's very short stride length. Multimodal cues for upright posture and hip extension to improve balance. Pt requires totalA+2 to scoot  back in Tilt in space WC following each bout of ambulation. WC transport back to room. Pt performs stand step transfer back to bed, 5' with ModA +1. Sit to supine with maxA and left with alarm intact and all needs within reach.  Therapy Documentation Precautions:  Precautions Precautions: Fall,Other (comment) Precaution Comments: O2 via trach collar; PEG tube, delayed processing/initiation Restrictions Weight Bearing Restrictions: Yes RUE Weight Bearing: Non weight bearing    Therapy/Group: Individual Therapy  Breck Coons, PT, DPT 01/17/2021, 4:08 PM

## 2021-01-17 NOTE — Progress Notes (Signed)
Speech Language Pathology Weekly Progress and Session Note  Patient Details  Name: [name] [name] MRN: [id number] Date of Birth: [birth year]  Beginning of progress report period: January 10, 2021 End of progress report period: January 17, 2021  Today's Date: 01/17/2021 SLP Individual Time: 7829-5621 SLP Individual Time Calculation (min): 55 min  Short Term Goals: Week 1: SLP Short Term Goal 1 (Week 1): Patient will tolerate PMSV for 30 minutes with all vitals remaining WFL and without evidence of back pressure with supervision. SLP Short Term Goal 1 - Progress (Week 1): Met SLP Short Term Goal 2 (Week 1): Patient will vocalize at the word level in 50% of opportunities with Max A multimodal cues. SLP Short Term Goal 2 - Progress (Week 1): Not met SLP Short Term Goal 3 (Week 1): Patient will follow 1-step commands with Max A multimodal cues in 50% of opportunities. SLP Short Term Goal 3 - Progress (Week 1): Met SLP Short Term Goal 4 (Week 1): Patient will focus attention to basic tasks for 2 minutes with Mod A multimodal cues. SLP Short Term Goal 4 - Progress (Week 1): Not met SLP Short Term Goal 5 (Week 1): Patient will consume trials of ice chips with minimal overt s/s of aspiration and Min verbal cues for AP transit and swallow initation over 2 sessions to assess readiness for MBS. SLP Short Term Goal 5 - Progress (Week 1): Not met    New Short Term Goals: Week 2: SLP Short Term Goal 1 (Week 2): Patient will tolerate PMSV for 60 minutes with all vitals remaining WFL and without evidence of back pressure with supervision. SLP Short Term Goal 2 (Week 2): Patient will vocalize at the word level in 50% of opportunities with Max A multimodal cues. SLP Short Term Goal 3 (Week 2): Patient will follow 1-step commands with Max A multimodal cues in75% of opportunities. SLP Short Term Goal 4 (Week 2): Patient will focus attention to basic tasks for 2 minutes with Mod A multimodal cues. SLP Short  Term Goal 5 (Week 2): Patient will consume trials of ice chips with minimal overt s/s of aspiration and Min verbal cues for AP transit and swallow initation over 2 sessions to assess readiness for MBS.  Weekly Progress Updates: Patient has made functional gains and has met 2 of 5 STGs this reporting period. Currently, patient has a #4 cuffless trach and is tolerating her PMSV for ~45 minutes with all vitals remaining WFL and without evidence of breath stacking. Patient with minimal verbalizations but has demonstrated increased attempts to whisper words during the last 2 sessions. Patient continues to require Max A multimodal cues for vocalizations but has been minimally successful, suspect due to cognitive deficits. Patient has been participating in trials of ice chips with intermittent delayed coughing with Max A multimodal cues needed for AP transit and swallow trigger. Recommend ongoing trials with SLP with plans for MBS soon. Patient also requires overall Max A multimodal cues to complete functional and familiar tasks safely in regards to orientation, initiation, following commands and attention. Patient and family education ongoing. Patient would benefit from continued skilled SLP intervention to maximize her cognitive and swallowing function as well as her functional communication prior to discharge.      Intensity: Minumum of 1-2 x/day, 30 to 90 minutes Frequency: 3 to 5 out of 7 days Duration/Length of Stay: 3 weeks Treatment/Interventions: Dysphagia/aspiration precaution training;Therapeutic Activities;Environmental controls;Cueing hierarchy;Functional tasks;Patient/family education;Speech/Language facilitation;Cognitive remediation/compensation;Internal/external aids   Daily Session  Skilled Therapeutic  Interventions:  Skilled treatment session focused on dysphagia and communication goals. SLP donned PMSV which remained in place throughout the session with all vitals remaining WFL. SLP  provided oral care via the suction toothbrush and administered trials of ice chips. Patient demonstrated improved and more timely oral manipulation and AP transit with a consistent swallow trigger. No overt s/s of aspiration noted. Recommend ongoing trials with SLP only. SLP also provided written words in which she was able to read aloud in 90% of opportunities. Patient also with minimal vocalizations during automatic tasks. SLP also called the patient's husband on speaker phone to encourage vocalizing with minimal success. Patient's voice remains grossly aphonic despite Max A multimodal cues during vocalization tasks. Patient left upright in wheelchair with alarm on, all needs within reach and PMSV removed. Continue with current plan of care.     Pain No/Denies Pain   Therapy/Group: Individual Therapy  [name] [name] 01/17/2021, 6:34 AM

## 2021-01-18 LAB — GLUCOSE, CAPILLARY
Glucose-Capillary: 154 mg/dL — ABNORMAL HIGH (ref 70–99)
Glucose-Capillary: 178 mg/dL — ABNORMAL HIGH (ref 70–99)
Glucose-Capillary: 188 mg/dL — ABNORMAL HIGH (ref 70–99)
Glucose-Capillary: 179 mg/dL — ABNORMAL HIGH (ref 70–99)
Glucose-Capillary: 174 mg/dL — ABNORMAL HIGH (ref 70–99)

## 2021-01-18 MED ORDER — STERILE WATER FOR INJECTION IJ SOLN
INTRAMUSCULAR | Status: AC
  Filled 2021-01-18: qty 10

## 2021-01-18 NOTE — Progress Notes (Signed)
PROGRESS NOTE   Subjective/Complaints:   Pt is nonverbal raised eyebrows and stared at me, but no signs of distress.   Satting well on FiO2 28% on TC.   ROS: limited due to cognition/aphasia    Objective:   No results found. No results for input(s): WBC, HGB, HCT, PLT in the last 72 hours. No results for input(s): NA, K, CL, CO2, GLUCOSE, BUN, CREATININE, CALCIUM in the last 72 hours.  Intake/Output Summary (Last 24 hours) at 01/18/2021 1816 Last data filed at 01/18/2021 0305 Gross per 24 hour  Intake 0 ml  Output 0 ml  Net 0 ml        Physical Exam: Vital Signs Blood pressure 140/88, pulse 97, temperature 98.5 F (36.9 C), resp. rate 20, height 5\' 4"  (1.626 m), weight 77.3 kg, SpO2 97 %.    General: awake, alert, staring at examiner, NAD HENT: conjugate gaze; - Trach in place- no PMV; O2 via TC CV: regular rate; no JVD Pulmonary: CTA B/L; no W/R/R- good air movement- some upper airway sounds, but sounds good overall GI: soft, NT, ND, (+)BS Psychiatric: flat, nonverba Neurological: nonverbal- stared at me with monotone type facies.   Musculoskeletal:     Comments: No edema or tenderness in extremities  Neurological:     alert. Non-verbal. Inconsistently follows commands. Makes eye contact. May occasionally nod y/n appropriately. Does move all 4 ext    Assessment/Plan: 1. Functional deficits which require 3+ hours per day of interdisciplinary therapy in a comprehensive inpatient rehab setting.  Physiatrist is providing close team supervision and 24 hour management of active medical problems listed below.  Physiatrist and rehab team continue to assess barriers to discharge/monitor patient progress toward functional and medical goals  Care Tool:  Bathing        Body parts bathed by helper: Right arm,Left arm,Chest,Abdomen,Front perineal area,Buttocks,Right upper leg,Left upper leg,Right lower leg,Left  lower leg,Face     Bathing assist Assist Level: Dependent - Patient 0%     Upper Body Dressing/Undressing Upper body dressing   What is the patient wearing?: Pull over shirt    Upper body assist Assist Level: Dependent - Patient 0%    Lower Body Dressing/Undressing Lower body dressing      What is the patient wearing?: Incontinence brief,Pants     Lower body assist Assist for lower body dressing: 2 Helpers     Toileting Toileting    Toileting assist Assist for toileting: 2 Helpers (2 helpers to turn pt in bed)     Transfers Chair/bed transfer  Transfers assist  Chair/bed transfer activity did not occur: Safety/medical concerns  Chair/bed transfer assist level: Moderate Assistance - Patient 50 - 74%     Locomotion Ambulation   Ambulation assist   Ambulation activity did not occur: Safety/medical concerns  Assist level: Moderate Assistance - Patient 50 - 74% Assistive device: Hand held assist Max distance: 5'   Walk 10 feet activity   Assist  Walk 10 feet activity did not occur: Safety/medical concerns  Assist level: 2 helpers (+3 with WC follow) Assistive device: Hand held assist   Walk 50 feet activity   Assist Walk 50 feet with 2  turns activity did not occur: Safety/medical concerns         Walk 150 feet activity   Assist Walk 150 feet activity did not occur: Safety/medical concerns         Walk 10 feet on uneven surface  activity   Assist Walk 10 feet on uneven surfaces activity did not occur: Safety/medical concerns         Wheelchair     Assist Will patient use wheelchair at discharge?: No (Per PT long-term goals)             Wheelchair 50 feet with 2 turns activity    Assist            Wheelchair 150 feet activity     Assist          Blood pressure 140/88, pulse 97, temperature 98.5 F (36.9 C), resp. rate 20, height 5\' 4"  (1.626 m), weight 77.3 kg, SpO2 97 %.  Medical Problem List and  Plan: 1.  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  Antithrombotics: -DVT/anticoagulation: SCDs             -antiplatelet therapy: N/A 3. Pain Management:   2/19: Does not indicate or appear to be in pain and has scheduled oxycodone q8H- will decrease to q12H.  2/21:  Oxycodone decreased to daily 2/20--pain seems controlled.   2/23: d/c oxycodone  2/26- per therapy and nursing, no signs of pain- con't tylenol 4. Mood: Demonstrates no anxiety or agitation, has been alert during the day, d/c Klonopin.               -antipsychotic agents: Seroquel 25 mg nightly                         This medication is necessary. 5. Neuropsych: This patient is not capable of making decisions on her own behalf.  -Continue ritalin trial to help attention and initation 6. Skin/Wound Care: Routine skin checks 7. Fluids/Electrolytes/Nutrition: TF at 55cc/hr 8.  Seizure prophylaxis.  Keppra 1000 mg twice daily.  EEG negative 9.  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    - requested downsize to #4 trach cuffless 2/21   2/26- satting 97% on FiO2 28% via TC 10.  Dysphagia with decreased nutritional storage.  Gastrostomy tube 12/13/2020 per Dr. Andrey Farmer.  Dietary follow-up.             Advance diet as tolerated 11.  Aspiration pneumonia.  5-day course of Unasyn completed. 12.  Hypertension.  Well controlled, continue Norvasc 10 mg daily, Lopressor 25 mg twice daily.  Patient would follow-up cardiology services.             controlled 2/22 14.  Morbid obesity.  BMI 31.47.  Dietary follow-up 15.  Prediabetes.  Hemoglobin A1c 5.9.  CBGs monitored while on tube feeds             Moderate increased 16. Tachycardia:  2/25: HR up to 120- BP also high. Add propanolol 10mg  daily.  2/26- HR much better- mid to upper 90s- con't Regimen  17. Loose  stool:  -likely d/t reglan and TF  -TF are continuous  to avoid excessive loose stooling with bolus  -add probiotic. Won't use cholestyramine d/t concern of adhesion to PEG  -d/c stool softeners  LOS: 8 days A FACE TO FACE EVALUATION WAS PERFORMED  Kwanza Cancelliere 01/18/2021, 6:16 PM

## 2021-01-19 LAB — GLUCOSE, CAPILLARY
Glucose-Capillary: 127 mg/dL — ABNORMAL HIGH (ref 70–99)
Glucose-Capillary: 152 mg/dL — ABNORMAL HIGH (ref 70–99)
Glucose-Capillary: 153 mg/dL — ABNORMAL HIGH (ref 70–99)
Glucose-Capillary: 157 mg/dL — ABNORMAL HIGH (ref 70–99)
Glucose-Capillary: 168 mg/dL — ABNORMAL HIGH (ref 70–99)

## 2021-01-19 NOTE — Progress Notes (Signed)
Speech Language Pathology Daily Session Note  Patient Details  Name: [name] [name] MRN: [id number] Date of Birth: 15-Aug-1966  Today's Date: 01/19/2021 SLP Individual Time: 1005-1100 SLP Individual Time Calculation (min): 55 min  Short Term Goals: Week 2: SLP Short Term Goal 1 (Week 2): Patient will tolerate PMSV for 60 minutes with all vitals remaining WFL and without evidence of back pressure with supervision. SLP Short Term Goal 2 (Week 2): Patient will vocalize at the word level in 50% of opportunities with Max A multimodal cues. SLP Short Term Goal 3 (Week 2): Patient will follow 1-step commands with Max A multimodal cues in75% of opportunities. SLP Short Term Goal 4 (Week 2): Patient will focus attention to basic tasks for 2 minutes with Mod A multimodal cues. SLP Short Term Goal 5 (Week 2): Patient will consume trials of ice chips with minimal overt s/s of aspiration and Min verbal cues for AP transit and swallow initation over 2 sessions to assess readiness for MBS.  Skilled Therapeutic Interventions:  Pt was seen for skilled ST targeting goals for dysphagia and communication.  Pt was received in bed having just returned to room from PT session.  SLP donned PMSV which pt wore for the duration of today's session with s/s of good toleration (no obvious s/s of distress, no appreciable changes in vital signs, no evidence of breath stacking upon valve removal).  Voicing was noted briefly during today's session with structured practice of vowel productions both in isolation (/ah/) and when alternating between two vowel sounds (/oo/ /ee/ /oo/).  Voicing was characterized by very low intensity with poor breath support and concomitant tremulousness. When singing a basic, familiar song in unison with therapist, pt was able to sing 4 out of 18 words but intelligibility was significantly decreased due to the abovementioned deficits.  Prior to PO trials oral care was completed to minimize bacterial load  in light of significant aspiration risk.  When asked if pt could do her own oral care or if she preferred SLP to do it she mouthed "You do it."   Pt consumed therapeutic trials of ice chips and thin liquids via teaspoon with no overt s/s of aspiration.  Oral manipulation of boluses was prolonged with suspected delay in swallow response as a result.   Recommend that pt remain NPO with alternative means of nutrition.  Pt was left in bed with bed alarm set and call bell within reach. Valve was removed prior to therapist's departure.  Continue per current plan of care.    Pain Pain Assessment Pain Scale: 0-10 Pain Score: 0-No pain   Therapy/Group: Individual Therapy  Page, Selinda Orion 01/19/2021, 12:39 PM

## 2021-01-19 NOTE — Progress Notes (Signed)
PROGRESS NOTE   Subjective/Complaints:   Pt is twisted up in a little ball with feet near her chest, shook head no that has no issues- NT came in- pt was laughing quietly when NT discussed how pt likes to remove trach inner cannula last week.    ROS: limited due to cognition/no PMV    Objective:   No results found. No results for input(s): WBC, HGB, HCT, PLT in the last 72 hours. No results for input(s): NA, K, CL, CO2, GLUCOSE, BUN, CREATININE, CALCIUM in the last 72 hours. No intake or output data in the 24 hours ending 01/19/21 1354      Physical Exam: Vital Signs Blood pressure 125/84, pulse 82, temperature 98.2 F (36.8 C), resp. rate 19, height 5\' 4"  (1.626 m), weight 78.5 kg, SpO2 98 %.     General: awake, alert, appropriate,twisted up with feet near chest, very agile, NAD HENT: conjugate gaze; trach in place- no PMV- O2 via TC 28%- sats 95% CV: regular rate; no JVD Pulmonary: a little coarse, but good air movement B/L; no W/R/R GI: soft, NT, ND, (+)BS Psychiatric: smiling more Neurological: nonverbal due to lack of PMV and anoxia Musculoskeletal:     Comments: No edema or tenderness in extremities  Neurological:     alert. Non-verbal. Inconsistently follows commands. Makes eye contact. May occasionally nod y/n appropriately. Does move all 4 ext    Assessment/Plan: 1. Functional deficits which require 3+ hours per day of interdisciplinary therapy in a comprehensive inpatient rehab setting.  Physiatrist is providing close team supervision and 24 hour management of active medical problems listed below.  Physiatrist and rehab team continue to assess barriers to discharge/monitor patient progress toward functional and medical goals  Care Tool:  Bathing        Body parts bathed by helper: Right arm,Left arm,Chest,Abdomen,Front perineal area,Buttocks,Right upper leg,Left upper leg,Right lower leg,Left  lower leg,Face     Bathing assist Assist Level: Dependent - Patient 0%     Upper Body Dressing/Undressing Upper body dressing   What is the patient wearing?: Pull over shirt    Upper body assist Assist Level: Dependent - Patient 0%    Lower Body Dressing/Undressing Lower body dressing      What is the patient wearing?: Incontinence brief,Pants     Lower body assist Assist for lower body dressing: 2 Helpers     Toileting Toileting    Toileting assist Assist for toileting: 2 Helpers (2 helpers to turn pt in bed)     Transfers Chair/bed transfer  Transfers assist  Chair/bed transfer activity did not occur: Safety/medical concerns  Chair/bed transfer assist level: Moderate Assistance - Patient 50 - 74%     Locomotion Ambulation   Ambulation assist   Ambulation activity did not occur: Safety/medical concerns  Assist level: Moderate Assistance - Patient 50 - 74% (with close w/c follow) Assistive device: Hand held assist Max distance: 85 ft   Walk 10 feet activity   Assist  Walk 10 feet activity did not occur: Safety/medical concerns  Assist level: Moderate Assistance - Patient - 50 - 74% Assistive device: Hand held assist   Walk 50 feet activity  Assist Walk 50 feet with 2 turns activity did not occur: Safety/medical concerns         Walk 150 feet activity   Assist Walk 150 feet activity did not occur: Safety/medical concerns         Walk 10 feet on uneven surface  activity   Assist Walk 10 feet on uneven surfaces activity did not occur: Safety/medical concerns         Wheelchair     Assist Will patient use wheelchair at discharge?: No (Per PT long-term goals)             Wheelchair 50 feet with 2 turns activity    Assist            Wheelchair 150 feet activity     Assist          Blood pressure 125/84, pulse 82, temperature 98.2 F (36.8 C), resp. rate 19, height 5\' 4"  (1.626 m), weight 78.5 kg,  SpO2 98 %.  Medical Problem List and Plan: 1.  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  Antithrombotics: -DVT/anticoagulation: SCDs             -antiplatelet therapy: N/A 3. Pain Management:   2/19: Does not indicate or appear to be in pain and has scheduled oxycodone q8H- will decrease to q12H.  2/21:  Oxycodone decreased to daily 2/20--pain seems controlled.   2/23: d/c oxycodone  2/27- tylenol working per staff, con't regimen prn 4. Mood: Demonstrates no anxiety or agitation, has been alert during the day, d/c Klonopin.               -antipsychotic agents: Seroquel 25 mg nightly                         This medication is necessary. 5. Neuropsych: This patient is not capable of making decisions on her own behalf.  -Continue ritalin trial to help attention and initation  2/27- pt laughing with NT today- which is a good sign 6. Skin/Wound Care: Routine skin checks 7. Fluids/Electrolytes/Nutrition: TF at 55cc/hr 8.  Seizure prophylaxis.  Keppra 1000 mg twice daily.  EEG negative 9.  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    - requested downsize to #4 trach cuffless 2/21   2/26- satting 97% on FiO2 28% via TC  2/27- satting 95% on FiO2 of 28% via TC- no resp distress 10.  Dysphagia with decreased nutritional storage.  Gastrostomy tube 12/13/2020 per Dr. Andrey Farmer.  Dietary follow-up.             Advance diet as tolerated 11.  Aspiration pneumonia.  5-day course of Unasyn completed. 12.  Hypertension.  Well controlled, continue Norvasc 10 mg daily, Lopressor 25 mg twice daily.  Patient would follow-up cardiology services.             controlled 2/22  2/28- BP 120s/70s- well controlled- con't regimen 14.  Morbid obesity.  BMI 31.47.  Dietary follow-up 15.  Prediabetes.  Hemoglobin A1c 5.9.  CBGs monitored while on  tube feeds             Moderate increased  2/27- BGs 127-179-  monitor closely since elevated 16. Tachycardia:  2/25: HR up to 120- BP also high. Add propanolol 10mg  daily.  2/27- HR 80s today- doing great with Propranolol- con't regimen 17. Loose stool:  -likely d/t reglan and TF  -TF are continuous to avoid excessive loose stooling with bolus  -add probiotic. Won't use cholestyramine d/t concern of adhesion to PEG  -d/c stool softeners  LOS: 9 days A FACE TO FACE EVALUATION WAS PERFORMED  [name] [name] 01/19/2021, 1:54 PM

## 2021-01-19 NOTE — Progress Notes (Addendum)
Physical Therapy Session Note  Patient Details  Name: [name] [name] MRN: [id number] Date of Birth: 06/13/1966  Today's Date: 01/19/2021 PT Individual Time: 0850-1005 PT Individual Time Calculation (min): 75 min   Short Term Goals: Week 1:  PT Short Term Goal 1 (Week 1): Pt will perform supine<>sit with mod assist of 1 consistently PT Short Term Goal 2 (Week 1): Pt will perform sit<>stands using LRAD with +2 max assist PT Short Term Goal 3 (Week 1): Pt will perform bed<>chair transfers using LRAD with +2 max assist PT Short Term Goal 4 (Week 1): Pt will tolerate standing at least 30 seconds with +2 max assist using LRAD  Skilled Therapeutic Interventions/Progress Updates:     Patient in bed upon PT arrival. Patient alert and agreeable to PT session. Patient denied pain during session. Patient tolerated session on RA with PMSV in place, SPO2 >95%, HR 70-90 bpm. Patient verbalized one word answers when prompted x4 and 1 short phrase at end of session asking for water.   Therapeutic Activity: Bed Mobility: Patient performed rolling R/L with min-mod A to change mildly soiled brief and for peri-care with total A. She performed supine to/from sit with mod A for trunk and lower extremity managment. Provided verbal cues for rolling R, using bottom elbow for improved trunk control, and bringing legs on/off the bed. Patient sat EOB with CGA-supervision to doff gown, don t-shirt, and done pants with max-total A for time/energy management. Pulled pants up in standing with total A from a second person.  Transfers: Patient performed stand pivot bed<>w/c with min-mod A with facilitation for initiating pivot due to motor planning deficits. Patient performed sit to/from stand x3 with L rail min A progressing to CGA and x2 without AD with min A. Provided verbal cues for foot placement, midline orientation prior to and in standing to correct R lean, scooting forward and forward weight shift. Patient required  max-total A for reciprocal scooting in the w/c.   Gait Training:  Patient ambulated 52 feet, 100 feet, and 85 feet using L rail x5 feet with poor motor planning for adjusting L hand on rail then with 3 musketeer technique x2 trials, mod a +2 and 1 person HHA x1 trial with mod A. Close w/c follow from 2nd or 3rd person for safety each trial. Ambulated with decreased gait speed, step height, and step length L>R, with step-to gait pattern leading with R progressing to step-through gait pattern with cues/facilitation for R weight shift, increased hip flexion, narrow BOS, and downward head gaze. Provided verbal cues for erect posture with tactile cues through glutes for increased hip extension, increased L step length for reciprocal gait pattern, and increased step height and BOS for safety.  Wheelchair Mobility:  Patient was transported in the w/c with total A throughout session for energy conservation and time management. Changed TIS w/c out for manual w/c due to poor fit to patient and patient with improved trunk control in sitting.   Neuromuscular Re-ed: Patient performed the following activities: -standing x 2 min x2 with L upper extremity support focused on midline orientation using visual target for L shoulder to correct R lean and increased gluteal activation R>L for erect posture and increased stability in standing -R upper extremity AAROM for increased motor control with grip with finger flexion/extension, elbow flexion/extension, and shoulder flexion 2x5 during sitting rest breaks between gait trials  Patient in bed, per patient request when provided the option of bed or chair at end of session with  breaks locked, bed alarm set, and all needs within reach.    Therapy Documentation Precautions:  Precautions Precautions: Fall,Other (comment) Precaution Comments: O2 via trach collar; PEG tube, delayed processing/initiation Restrictions Weight Bearing Restrictions: Yes RUE Weight Bearing:  Non weight bearing   Therapy/Group: Individual Therapy  Shoua Ressler L Gearlene Godsil PT, DPT  01/19/2021, 12:46 PM

## 2021-01-20 LAB — GLUCOSE, CAPILLARY
Glucose-Capillary: 157 mg/dL — ABNORMAL HIGH (ref 70–99)
Glucose-Capillary: 159 mg/dL — ABNORMAL HIGH (ref 70–99)
Glucose-Capillary: 173 mg/dL — ABNORMAL HIGH (ref 70–99)
Glucose-Capillary: 187 mg/dL — ABNORMAL HIGH (ref 70–99)
Glucose-Capillary: 153 mg/dL — ABNORMAL HIGH (ref 70–99)
Glucose-Capillary: 186 mg/dL — ABNORMAL HIGH (ref 70–99)

## 2021-01-20 MED ORDER — METHYLPHENIDATE HCL 5 MG PO TABS
10.0000 mg | ORAL_TABLET | Freq: Two times a day (BID) | ORAL | Status: DC
  Administered 2021-01-20 – 2021-02-04 (×29): 10 mg
  Filled 2021-01-20 (×30): qty 2

## 2021-01-20 NOTE — Progress Notes (Signed)
PROGRESS NOTE   Subjective/Complaints:  Pt lying in bed. Dressed up in work out clothes! Says she feels well.   ROS: Limited due to cognitive/behavioral   Objective:   No results found. No results for input(s): WBC, HGB, HCT, PLT in the last 72 hours. No results for input(s): NA, K, CL, CO2, GLUCOSE, BUN, CREATININE, CALCIUM in the last 72 hours.  Intake/Output Summary (Last 24 hours) at 01/20/2021 1058 Last data filed at 01/20/2021 0730 Gross per 24 hour  Intake 1 ml  Output -  Net 1 ml        Physical Exam: Vital Signs Blood pressure 130/76, pulse 100, temperature 98.7 F (37.1 C), resp. rate 18, height 5\' 4"  (1.626 m), weight 78 kg, SpO2 97 %.     Constitutional: No distress . Vital signs reviewed. HEENT: EOMI, oral membranes moist Neck: #4 trach with PMV Cardiovascular: RRR without murmur. No JVD    Respiratory/Chest: CTA Bilaterally without wheezes or rales. Normal effort.     GI/Abdomen: BS +, non-tender, non-distended Ext: no clubbing, cyanosis, or edema Psych: flat but cooperative Musculoskeletal:     Comments: No edema or tenderness in extremities  Neurological:     alert. Follows simple commands with delay. Speech dysphonic. Moves all 4's.  Assessment/Plan: 1. Functional deficits which require 3+ hours per day of interdisciplinary therapy in a comprehensive inpatient rehab setting.  Physiatrist is providing close team supervision and 24 hour management of active medical problems listed below.  Physiatrist and rehab team continue to assess barriers to discharge/monitor patient progress toward functional and medical goals  Care Tool:  Bathing        Body parts bathed by helper: Right arm,Left arm,Chest,Abdomen,Front perineal area,Buttocks,Right upper leg,Left upper leg,Right lower leg,Left lower leg,Face     Bathing assist Assist Level: Dependent - Patient 0%     Upper Body  Dressing/Undressing Upper body dressing   What is the patient wearing?: Pull over shirt    Upper body assist Assist Level: Dependent - Patient 0%    Lower Body Dressing/Undressing Lower body dressing      What is the patient wearing?: Incontinence brief,Pants     Lower body assist Assist for lower body dressing: 2 Helpers     Toileting Toileting    Toileting assist Assist for toileting: 2 Helpers (2 helpers to turn pt in bed)     Transfers Chair/bed transfer  Transfers assist  Chair/bed transfer activity did not occur: Safety/medical concerns  Chair/bed transfer assist level: Moderate Assistance - Patient 50 - 74%     Locomotion Ambulation   Ambulation assist   Ambulation activity did not occur: Safety/medical concerns  Assist level: Moderate Assistance - Patient 50 - 74% (with close w/c follow) Assistive device: Hand held assist Max distance: 85 ft   Walk 10 feet activity   Assist  Walk 10 feet activity did not occur: Safety/medical concerns  Assist level: Moderate Assistance - Patient - 50 - 74% Assistive device: Hand held assist   Walk 50 feet activity   Assist Walk 50 feet with 2 turns activity did not occur: Safety/medical concerns         Walk 150 feet  activity   Assist Walk 150 feet activity did not occur: Safety/medical concerns         Walk 10 feet on uneven surface  activity   Assist Walk 10 feet on uneven surfaces activity did not occur: Safety/medical concerns         Wheelchair     Assist Will patient use wheelchair at discharge?: No (Per PT long-term goals)             Wheelchair 50 feet with 2 turns activity    Assist            Wheelchair 150 feet activity     Assist          Blood pressure 130/76, pulse 100, temperature 98.7 F (37.1 C), resp. rate 18, height 5\' 4"  (1.626 m), weight 78 kg, SpO2 97 %.  Medical Problem List and Plan: 1.  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  fib arrest/acute respiratory failure             -patient may not shower             -ELOS/Goals: 27-30 days/min/mod a---establish date in TC tomorrow            Continue CIR PT, OT, SLP 2.  Antithrombotics: -DVT/anticoagulation: SCDs             -antiplatelet therapy: N/A 3. Pain Management:   -off oxycodone  2/28 on tylenol alone for pain and doing well 4. Mood: Demonstrates no anxiety or agitation, has been alert during the day, d/c Klonopin.               -antipsychotic agents: Seroquel 25 mg nightly                         This medication is necessary. 5. Neuropsych: This patient is not capable of making decisions on her own behalf.  -Continue ritalin trial to help attention and initation  2/28--increase ritalin to 10mg  6. Skin/Wound Care: Routine skin checks 7. Fluids/Electrolytes/Nutrition: TF at 55cc/hr 8.  Seizure prophylaxis.  Keppra 1000 mg twice daily.  EEG negative 9.  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    - requested downsize to #4 trach cuffless 2/21   2/28-- cork trach today 10.  Dysphagia with decreased nutritional storage.  Gastrostomy tube 12/13/2020 per Dr. Andrey Farmer.  .              -remains NPO, continue current TF 11.  Aspiration pneumonia.  5-day course of Unasyn completed. 12.  Hypertension.  Well controlled, continue Norvasc 10 mg daily, Lopressor 25 mg twice daily.  Patient would follow-up cardiology services.             controlled 2/22  2/28- BP well controlled 14.  Morbid obesity.  BMI 31.47.  Dietary follow-up 15.  Prediabetes.  Hemoglobin A1c 5.9.  CBGs monitored while on tube feeds             Moderate increased  2/27- BGs 127-179- monitor closely since elevated 16. Tachycardia:  2/28 better controlled with propranolol. Consider further increase if needed and bp tolerating 17. Loose stool:  -improved off reglan---stool formed early this morning  LOS: 10 days A FACE TO FACE EVALUATION WAS PERFORMED  Meredith Staggers 01/20/2021,  10:58 AM

## 2021-01-20 NOTE — Progress Notes (Signed)
RT NOTES: Patient's trach capped per MD orders. Pt tolerating well. Sats 96%. No distress noted. Will continue to monitor.

## 2021-01-20 NOTE — Progress Notes (Signed)
Occupational Therapy Weekly Progress Note  Patient Details  Name: [name] [name] MRN: [id number] Date of Birth: 03-02-1966  Beginning of progress report period: January 11, 2021 End of progress report period: January 20, 2021  Today's Date: 01/20/2021 OT Individual Time: 6546-5035 OT Individual Time Calculation (min): 58 min    Patient has met 3 of 4 short term goals. [name] is making steady progress towards her OT goals. Pt has demonstrated improved overall functional mobility this week and can complete sit<>stands and stand-pivot transfers with mod A of 1 person. She continues to have deficits in initiation, attention, and motor planning that are affecting her ability to perform BADL tasks. Overall, she still needs max A of 1 for BADLs. Continue current POC  Patient continues to demonstrate the following deficits: muscle weakness, decreased cardiorespiratoy endurance, impaired timing and sequencing, abnormal tone, unbalanced muscle activation, motor apraxia, ataxia, decreased coordination and decreased motor planning, decreased midline orientation, decreased attention to right, decreased motor planning and ideational apraxia, decreased initiation, decreased attention, decreased awareness, decreased problem solving, decreased safety awareness, decreased memory and delayed processing and decreased sitting balance, decreased standing balance, decreased postural control, hemiplegia and decreased balance strategies and therefore will continue to benefit from skilled OT intervention to enhance overall performance with BADL and Reduce care partner burden.  Patient progressing toward long term goals..  Continue plan of care.  OT Short Term Goals Week 1:  OT Short Term Goal 1 (Week 1): Pt will maintain unsupported sitting balance during self-care tasks for 5 mins with supevision OT Short Term Goal 1 - Progress (Week 1): Met OT Short Term Goal 2 (Week 1): Pt will complete UB bathing with mod  assist OT Short Term Goal 2 - Progress (Week 1): Progressing toward goal OT Short Term Goal 3 (Week 1): Pt will complete UB dressing with max assist OT Short Term Goal 3 - Progress (Week 1): Met OT Short Term Goal 4 (Week 1): Pt will complete toilet transfer with max assist of 1 caregiver OT Short Term Goal 4 - Progress (Week 1): Met OT Short Term Goal 5 (Week 1): Pt will complete UB bathing with mod assist Week 2:  OT Short Term Goal 1 (Week 2): Pt will complete 1 step of LB dressing task OT Short Term Goal 2 (Week 2): Pt will maintain standing balance at the sink with Min A in preparation for BADL tas  Skilled Therapeutic Interventions/Progress Updates:    Pt greeted in bed, and had wiggled her way to the bottom 1/2 of the bed. Pt alert and ready to get OOB. Pt able to move B LE towards EOB but needed a good max A to elevate trunk into sitting. Once sitting, pt able to maintain sitting balance with CGA/supervision. Stand-pivot bed to wc with mod A. Pt brought to the sink in wc for bathing/dressing tasks. Pt initiated bring B hands to the sink to grab and wring out wash clothes, but needed OT assist to bring wash cloth to body parts to bathe. Sit<>stand with Mod A, the min A for balance. Pt unable to motor plan or follow commands to wash buttocks and  Peri-area requiring OT assist. Worked on R visual scanning to try to locate items on R side of the sink, hand over hand A to reach and locate these items. Pt initialy trying to mouth something to OT, then whisper, then was able to turn on her voice to say " I want ice." OT educated on SLP role in  PO intake. Pt brought to therapy gym and tried to engage pt in graded peg board task. Pt unable to understand commands to complete task effectively. Pt needed max multimodal cues and hand over hand A to pick up a peg with very poor attention to any pegs on R side of board. Pt returned to room and completed stand-pivot back to bed with mod A. Pt left semi-reclined  in bed with bed alarm on and needs met.   Therapy Documentation Precautions:  Precautions Precautions: Fall,Other (comment) Precaution Comments: O2 via trach collar; PEG tube, delayed processing/initiation Restrictions Weight Bearing Restrictions: Yes RUE Weight Bearing: Non weight bearing  Pain:   denies pain  Therapy/Group: Individual Therapy  Valma Cava 01/20/2021, 8:14 AM

## 2021-01-20 NOTE — Progress Notes (Signed)
Speech Language Pathology Daily Session Note  Patient Details  Name: [name] [name] MRN: [id number] Date of Birth: May 12, 1966  Today's Date: 01/20/2021 SLP Individual Time: 1015-1055 SLP Individual Time Calculation (min): 40 min  Short Term Goals: Week 2: SLP Short Term Goal 1 (Week 2): Patient will tolerate PMSV for 60 minutes with all vitals remaining WFL and without evidence of back pressure with supervision. SLP Short Term Goal 2 (Week 2): Patient will vocalize at the word level in 50% of opportunities with Max A multimodal cues. SLP Short Term Goal 3 (Week 2): Patient will follow 1-step commands with Max A multimodal cues in75% of opportunities. SLP Short Term Goal 4 (Week 2): Patient will focus attention to basic tasks for 2 minutes with Mod A multimodal cues. SLP Short Term Goal 5 (Week 2): Patient will consume trials of ice chips with minimal overt s/s of aspiration and Min verbal cues for AP transit and swallow initation over 2 sessions to assess readiness for MBS.  Skilled Therapeutic Interventions: Pt was seen for skilled ST targeting dysphagia and communication goals. Pt better able to participate in oral care via suction toothbrush today, although still requiring most effective brushing to be done by SLP. She accepted trials of 10 ice chips and 10 tsp of thin H2O without any overt s/sx aspiration. She did manipulate boluses for an mildly prolonged amount of time, an manipulation was somewhat limited. SLP suspects that pt's oral phase deficits are more rooted in cognitive deficits as opposed to weakness of facial and/or oral musculature. She continues to demonstrate good progress toward readiness of MBSS, but would recommend continue NPO with PEG as means of nutrition for now. Pt tolerated wearing PMSV throughout entire session without any appreciable change in vitals or indication of disrress. No s/sx air trapping noted either when PMSV was intermittently doffed during session. She  required Max A multimodal cueing mostly for increased vocal intensity and specific cues to increase breath support and abdominal muscles when voicing. She made good attempts to communicate at word, phrase, and sentence level, however intelligibility was limited by diminished vocal intensity (~30-40% intelligible). Pt able to imitate vowel sounds "ah" "ee" and "oo" and sustain phonation for 3-5 seconds with Max A demonstrational cueing from SLP.  She was oriented to self, "hospital", and general situation. She was also able to use a calendar and Mod A verbal cueing from clinician to orient to date. She also demonstrated which button to press on call bell for help with Min A verbal cues from clinician. Pt communicated that she need "air" because was hot and requesting "juice" to drink at end of session. Education provided to pt regarding current NPO status, trials with SLP, and MBSS to be planned soon to determine if and what is safest for patient to eat and drink. Pt left laying in bed with left hand mitt donned again, bed alarm set, call bell in lap. Continue per current plan of care.        Pain Pain Assessment Pain Scale: Faces Faces Pain Scale: No hurt  Therapy/Group: Individual Therapy  Arbutus Leas 01/20/2021, 7:24 AM

## 2021-01-20 NOTE — Progress Notes (Signed)
Nurse walked into patient's room because bed alarm was going off and patient was at edge of bed attempting to stand up. Patient not compliant with safety so telesitter was ordered.

## 2021-01-20 NOTE — Progress Notes (Signed)
[facility] ([facility]) Community Based Palliative Care       This patient has been referred to our palliative care services in the community.  [facility] will continue to follow for any discharge planning needs and to coordinate admission onto palliative care.    Thank you for the opportunity to participate in this patient's care.     Chrislyn King, BSN, RN [facility] Hospital Liaison   [phone] (24h on call) 

## 2021-01-20 NOTE — Progress Notes (Signed)
Physical Therapy Weekly Progress Note  Patient Details  Name: [name] [name] MRN: [id number] Date of Birth: 10-16-1966  Beginning of progress report period: January 11, 2021 End of progress report period: January 20, 2021  Today's Date: 01/20/2021 PT Individual Time: 1110-1205 PT Individual Time Calculation (min): 55 min   Patient has met 4 of 4 short term goals.  Pt is progressing very well toward mobility goals, demonstrating improvements in all aspects of mobility as well as level of alertness ability to attend to task. Pt performs bed mobility with modA consistently and occasionally with minA. Sit to stand and stand step transfers to [facility] consistently with modA. Pt ambulating over 100' with +2 assist. Pt demonstrates increased weakness in R hemibody versus L with R upper extremity most affected.  Patient continues to demonstrate the following deficits muscle weakness, decreased cardiorespiratoy endurance, motor apraxia and decreased coordination, decreased attention, decreased awareness, decreased problem solving, decreased safety awareness and decreased memory and decreased sitting balance, decreased standing balance, decreased postural control, hemiplegia and decreased balance strategies and therefore will continue to benefit from skilled PT intervention to increase functional independence with mobility.  Patient progressing toward long term goals..  Continue plan of care.  PT Short Term Goals Week 1:  PT Short Term Goal 1 (Week 1): Pt will perform supine<>sit with mod assist of 1 consistently PT Short Term Goal 1 - Progress (Week 1): Met PT Short Term Goal 2 (Week 1): Pt will perform sit<>stands using LRAD with +2 max assist PT Short Term Goal 2 - Progress (Week 1): Met PT Short Term Goal 3 (Week 1): Pt will perform bed<>chair transfers using LRAD with +2 max assist PT Short Term Goal 3 - Progress (Week 1): Met PT Short Term Goal 4 (Week 1): Pt will tolerate standing at least 30  seconds with +2 max assist using LRAD PT Short Term Goal 4 - Progress (Week 1): Met Week 2:  PT Short Term Goal 1 (Week 2): Pt will complete bed mobility with minA consistently. PT Short Term Goal 2 (Week 2): Pt will complete bed to chair transfer with minA consistently. PT Short Term Goal 3 (Week 2): Pt will ambulates 100' with minA and LRAD. PT Short Term Goal 4 (Week 2): Pt will perform Berg Balance assessment.  Skilled Therapeutic Interventions/Progress Updates:     Pt received supine in bed and agrees to therapy. No indication of pain. Pt had recently had RT in room with trach capped and on room air. Supine to sit with minA and cues on positioning. Pt performs stand step transfer to [facility] with modA. WC transport to gym for time management. Pt ambulates x80' with HHA on R and +2 WC follow for safety. Pt has hips angled to the L during ambulation and requires manual assistance to maintain midline neutral posture. Following seated rest break, pt transfers to Nustep with modA. Nustep performed for strength and endurance training and reciprocal coordination.Pt has difficulty maintaining R hand grip on handle so PT ace wraps hand for support. PT also provides verbal and tactile cues for hand and foot placement for optimal body mechanics. Pt completes at workload of 3 for total of 10:00 with multiple seated rest break. Following, pt ambulates back to room, x80' with modA HHA +1 and improving gait pattern. Sit to supine with modA. Left supine in bed with alarm intact and all needs within reach.  Therapy Documentation Precautions:  Precautions Precautions: Fall,Other (comment) Precaution Comments: O2 via trach collar; PEG tube,  delayed processing/initiation Restrictions Weight Bearing Restrictions: Yes RUE Weight Bearing: Non weight bearing   Therapy/Group: Individual Therapy  Breck Coons, PT, DPT 01/20/2021, 3:48 PM

## 2021-01-20 NOTE — Progress Notes (Signed)
Speech Language Pathology Daily Session Note  Patient Details  Name: [name] [name] MRN: [id number] Date of Birth: 07-26-1966  Today's Date: 01/20/2021 SLP Individual Time: 1300-1400 SLP Individual Time Calculation (min): 60 min  Short Term Goals: Week 2: SLP Short Term Goal 1 (Week 2): Patient will tolerate PMSV for 60 minutes with all vitals remaining WFL and without evidence of back pressure with supervision. SLP Short Term Goal 2 (Week 2): Patient will vocalize at the word level in 50% of opportunities with Max A multimodal cues. SLP Short Term Goal 3 (Week 2): Patient will follow 1-step commands with Max A multimodal cues in75% of opportunities. SLP Short Term Goal 4 (Week 2): Patient will focus attention to basic tasks for 2 minutes with Mod A multimodal cues. SLP Short Term Goal 5 (Week 2): Patient will consume trials of ice chips with minimal overt s/s of aspiration and Min verbal cues for AP transit and swallow initation over 2 sessions to assess readiness for MBS.  Skilled Therapeutic Interventions: Skilled treatment session focused on dysphagia, cognitive and speech goals. Upon arrival, patient's trach was capped. SLP facilitated session by providing Max A multimodal cues for patient to utilize an increased vocal intensity at the word level to maximize intelligibility to ~50%. Patient independently oriented to place and month but required Max A verbal cues for orientation to date, year and situation. Patient participated in a basic sorting task with Max verbal cues for problem solving with decreased function with task due to decreased use of bilateral upper extremities. SLP performed oral care and patient consumed trials of ice chips with prolonged AP transit with intermittent, delayed coughing. Recommend ongoing trials with SLP. Patient requested to use the bathroom and was continent of urine. Patient left upright in bed with alarm on and all needs within reach. Continue with current plan  of care.       Pain Pain Assessment Pain Scale: Faces Faces Pain Scale: No hurt  Therapy/Group: Individual Therapy  PAYNE, COURTNEY 01/20/2021, 2:05 PM

## 2021-01-21 LAB — GLUCOSE, CAPILLARY
Glucose-Capillary: 176 mg/dL — ABNORMAL HIGH (ref 70–99)
Glucose-Capillary: 152 mg/dL — ABNORMAL HIGH (ref 70–99)
Glucose-Capillary: 139 mg/dL — ABNORMAL HIGH (ref 70–99)
Glucose-Capillary: 137 mg/dL — ABNORMAL HIGH (ref 70–99)
Glucose-Capillary: 144 mg/dL — ABNORMAL HIGH (ref 70–99)
Glucose-Capillary: 192 mg/dL — ABNORMAL HIGH (ref 70–99)

## 2021-01-21 NOTE — Progress Notes (Signed)
PROGRESS NOTE   Subjective/Complaints:  Pt had a reasonable night. Nurse reports she is moving more in bed, sometimes starting to get up OOB. Tolerated trach capping. May have popped off overnight   ROS: Limited due to cognitive/behavioral    Objective:   No results found. No results for input(s): WBC, HGB, HCT, PLT in the last 72 hours. No results for input(s): NA, K, CL, CO2, GLUCOSE, BUN, CREATININE, CALCIUM in the last 72 hours.  Intake/Output Summary (Last 24 hours) at 01/21/2021 1105 Last data filed at 01/21/2021 0813 Gross per 24 hour  Intake 0 ml  Output --  Net 0 ml        Physical Exam: Vital Signs Blood pressure 120/70, pulse 80, temperature 98 F (36.7 C), temperature source Oral, resp. rate 17, height 5\' 4"  (1.626 m), weight 77.4 kg, SpO2 100 %.     Constitutional: No distress . Vital signs reviewed. HEENT: EOMI, oral membranes moist Neck: trach with cap. Dysphonic voice Cardiovascular: RRR without murmur. No JVD    Respiratory/Chest: CTA Bilaterally without wheezes or rales. Normal effort    GI/Abdomen: BS +, non-tender, non-distended Ext: no clubbing, cyanosis, or edema Psych: pleasant and cooperative Musculoskeletal:     Comments: No edema or tenderness in extremities  Neurological:     alert. Follows basic commands.  Moves all 4's.  Assessment/Plan: 1. Functional deficits which require 3+ hours per day of interdisciplinary therapy in a comprehensive inpatient rehab setting.  Physiatrist is providing close team supervision and 24 hour management of active medical problems listed below.  Physiatrist and rehab team continue to assess barriers to discharge/monitor patient progress toward functional and medical goals  Care Tool:  Bathing        Body parts bathed by helper: Right arm,Left arm,Chest,Abdomen,Front perineal area,Buttocks,Right upper leg,Left upper leg,Right lower leg,Left lower  leg,Face     Bathing assist Assist Level: Dependent - Patient 0%     Upper Body Dressing/Undressing Upper body dressing   What is the patient wearing?: Pull over shirt    Upper body assist Assist Level: Dependent - Patient 0%    Lower Body Dressing/Undressing Lower body dressing      What is the patient wearing?: Incontinence brief,Pants     Lower body assist Assist for lower body dressing: 2 Helpers     Toileting Toileting    Toileting assist Assist for toileting: 2 Helpers (2 helpers to turn pt in bed)     Transfers Chair/bed transfer  Transfers assist  Chair/bed transfer activity did not occur: Safety/medical concerns  Chair/bed transfer assist level: Moderate Assistance - Patient 50 - 74%     Locomotion Ambulation   Ambulation assist   Ambulation activity did not occur: Safety/medical concerns  Assist level: Moderate Assistance - Patient 50 - 74% Assistive device: Hand held assist Max distance: 80'   Walk 10 feet activity   Assist  Walk 10 feet activity did not occur: Safety/medical concerns  Assist level: Moderate Assistance - Patient - 50 - 74% Assistive device: Hand held assist   Walk 50 feet activity   Assist Walk 50 feet with 2 turns activity did not occur: Safety/medical concerns  Assist  level: Moderate Assistance - Patient - 50 - 74%      Walk 150 feet activity   Assist Walk 150 feet activity did not occur: Safety/medical concerns         Walk 10 feet on uneven surface  activity   Assist Walk 10 feet on uneven surfaces activity did not occur: Safety/medical concerns         Wheelchair     Assist Will patient use wheelchair at discharge?: No (Per PT long-term goals)             Wheelchair 50 feet with 2 turns activity    Assist            Wheelchair 150 feet activity     Assist          Blood pressure 120/70, pulse 80, temperature 98 F (36.7 C), temperature source Oral, resp. rate 17,  height 5\' 4"  (1.626 m), weight 77.4 kg, SpO2 100 %.  Medical Problem List and Plan: 1.  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  Antithrombotics: -DVT/anticoagulation: SCDs             -antiplatelet therapy: N/A 3. Pain Management:   -off oxycodone  2/28 on tylenol alone for pain and doing well 4. Mood: Demonstrates no anxiety or agitation, has been alert during the day, d/c Klonopin.               -antipsychotic agents: Seroquel 25 mg nightly                         This medication is necessary. 5. Neuropsych: This patient is not capable of making decisions on her own behalf.  -Continue ritalin trial to help attention and initation  2/28--increased ritalin to 10mg --attention better 6. Skin/Wound Care: Routine skin checks 7. Fluids/Electrolytes/Nutrition: TF at 55cc/hr 8.  Seizure prophylaxis.  Keppra 1000 mg twice daily.  EEG negative 9.  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    - downsized to #4 trach cuffless 2/21   -3/1 tolerating capping for 24 hours--if ok overnight, then decannulate 3/2 10.  Dysphagia with decreased nutritional storage.  Gastrostomy tube 12/13/2020 per Dr. Andrey Farmer.  .              -remains NPO, continue current TF  -MBS 3/3 11.  Aspiration pneumonia.  5-day course of Unasyn completed. 12.  Hypertension.  Well controlled, continue Norvasc 10 mg daily, Lopressor 25 mg twice daily.  Patient would follow-up cardiology services.             controlled 2/22  2/28- BP well controlled 14.  Morbid obesity.  BMI 31.47.  Dietary follow-up 15.  Prediabetes.  Hemoglobin A1c 5.9.  CBGs monitored while on tube feeds             Moderate increased  2/27- BGs 127-179- monitor closely since elevated 16. Tachycardia:  2/28 better controlled with propranolol. Consider further increase if needed and  bp tolerating 17. Loose stool:  -improved off reglan---stool formed early this morning  LOS: 11 days A FACE TO FACE EVALUATION WAS PERFORMED  Meredith Staggers 01/21/2021, 11:05 AM

## 2021-01-21 NOTE — Progress Notes (Signed)
Patient ID: [name] [name], female   DOB: 19-Nov-1966, 55 y.o.   MRN: [id number]  SW met with pt husband Marguerite Olea in room to provide updates from team conference on gains made in rehab with updates from team conference, and d/c date 3/19. Pt husband reports he was able to get interim guardianship. States he will follow-up with [facility] [employer] to get her requested documents.  Loralee Pacas, MSW, [place] Office: [phone] Cell: [phone] Fax: [phone]

## 2021-01-21 NOTE — Patient Care Conference (Signed)
Inpatient RehabilitationTeam Conference and Plan of Care Update Date: 01/21/2021   Time: 10:37 AM    Patient Name: [name] [name]      Medical Record Number: [id number]  Date of Birth: 1965-12-11 Sex: Female         Room/Bed: 2P53I/1W43X-54 Payor Info: Payor: /    Admit Date/Time:  01/10/2021  2:23 PM  Primary Diagnosis:  Anoxic brain injury [facility])  Hospital Problems: Principal Problem:   Anoxic brain injury [facility])    Expected Discharge Date: Expected Discharge Date: 02/08/21  Team Members Present: Physician leading conference: Dr. Alger Simons Care Coodinator Present: Loralee Pacas, LCSWA;Sallie Staron Creig Hines, RN, BSN, [place] Nurse Present: Rayne Du, LPN PT Present: Tereasa Coop, PT OT Present: Cherylynn Ridges, OT SLP Present: Weston Anna, SLP PPS Coordinator present : Gunnar Fusi, SLP     Current Status/Progress Goal Weekly Team Focus  Bowel/Bladder   pt incont x 2 with some continence efforts being successful continenet bladder on 2/28 day shift Lbm 2/28  Pt regain continence of b/b  Q2H toileting   Swallow/Nutrition/ Hydration   NPO with PEG  min A  trials of ice and thin liquids, MBS this week   ADL's   Max A BADLs, Mod A transfers  Supervision/min A  cognitive retraining, attention, initiation, self-care retraining, transfers   Mobility   min to modA bed mobility, modA sit to stand transfer, gait up to 44' with modA HHA  minA  balance, transfers, ambulation, cognition   Communication   #4 cuffless trach, Capped- Max A for speech intelligibility and vocal intensity  Min A  increased vocal intensity, breath support, possible RMT   Safety/Cognition/ Behavioral Observations  Max A  Min A  attention, problem solving, awareness   Pain   pt unable to rate pain  Pt will be free of pain  Assess pain q4h and PRN   Skin   no  s/s of breakdown  Pt will remain free of skin breakdown/infection  Assess skin qshift and PRN     Discharge Planning:  Pt is uninsured. Pt  to d/c to home iwth 24/7 care from husband. They have two children that live in the home (17, 20) who will help as able. Husband also reports another child will be coming in to help when pt initially d/c to home.   Team Discussion: Capped trach, may decannulate tomorrow. Impulsive, no complaints of pain, skin CDI, continent B/B. Patient is uninsured, husband will provide 24/7 care. Patient on target to meet rehab goals: Min/mod assist stand pivot, has a hard time with functional task. Right inattention, ambulates 150 ft with hand held assist. Bed mobility and transfers are improving. Voice is hoarse, tolerating capping. MD okay with RMT training. Modified planned for Thursday. Patient has severe cognitive deficits.  *See Care Plan and progress notes for long and short-term goals.   Revisions to Treatment Plan:  None at this time.  Teaching Needs: Family education, medication management, skin/wound care, transfer training, gait training, endurance training, safety awareness.  Current Barriers to Discharge: Inaccessible home environment, Decreased caregiver support, Home enviroment access/layout, Trach, Wound care, Lack of/limited family support, Medication compliance, Behavior and Nutritional means  Possible Resolutions to Barriers: Continue current medications, offer nutritional support when appropriate, provide emotional support.     Medical Summary Current Status: improved arousal and awareness. sometimes impulsive. tolerating trach cork. bp/hr improving. still NPO  Barriers to Discharge: Medical stability   Possible Resolutions to Barriers/Weekly Focus: decannulate, MBS this week. daily assessment of  nutritional status, VS   Continued Need for Acute Rehabilitation Level of Care: The patient requires daily medical management by a physician with specialized training in physical medicine and rehabilitation for the following reasons: Direction of a multidisciplinary physical rehabilitation  program to maximize functional independence : Yes Medical management of patient stability for increased activity during participation in an intensive rehabilitation regime.: Yes Analysis of laboratory values and/or radiology reports with any subsequent need for medication adjustment and/or medical intervention. : Yes   I attest that I was present, lead the team conference, and concur with the assessment and plan of the team.   Cristi Loron 01/21/2021, 4:03 PM

## 2021-01-21 NOTE — Progress Notes (Signed)
Speech Language Pathology Daily Session Note  Patient Details  Name: [name] [name] MRN: [id number] Date of Birth: 11-Oct-1966  Today's Date: 01/21/2021 SLP Individual Time: 1007-1219 SLP Individual Time Calculation (min): 55 min  Short Term Goals: Week 2: SLP Short Term Goal 1 (Week 2): Patient will tolerate PMSV for 60 minutes with all vitals remaining WFL and without evidence of back pressure with supervision. SLP Short Term Goal 2 (Week 2): Patient will vocalize at the word level in 50% of opportunities with Max A multimodal cues. SLP Short Term Goal 3 (Week 2): Patient will follow 1-step commands with Max A multimodal cues in75% of opportunities. SLP Short Term Goal 4 (Week 2): Patient will focus attention to basic tasks for 2 minutes with Mod A multimodal cues. SLP Short Term Goal 5 (Week 2): Patient will consume trials of ice chips with minimal overt s/s of aspiration and Min verbal cues for AP transit and swallow initation over 2 sessions to assess readiness for MBS.  Skilled Therapeutic Interventions: Skilled treatment session focused on communication and dysphagia goals. Upon arrival, patient's trach cap had become loose and was found in her bed. RT called and replaced cap. Patient tolerated capping without signs of distress and with all vitals remaining WFL. Patient continues to require Mod A verbal cues for verbal initiation and Max A verbal cues for use of an increased vocal intensity at the word. Patient's overall speech quality is hoarse with decreased breath support which impacts her overall intelligibility. SLP performed oral care via the suction toothbrush and administered trials of ice chips. Patient initially with slow and prolonged AP transit that appeared to become more automatic with increased trials. Patient with intermittent delayed coughing. Will schedule MBS this week. Patient left upright in bed with alarm on and all needs within reach. Continue with current plan of care.       Pain Pain Assessment Pain Scale: Faces Faces Pain Scale: No hurt  Therapy/Group: Individual Therapy  Trinidee Schrag 01/21/2021, 10:10 AM

## 2021-01-21 NOTE — Progress Notes (Signed)
Physical Therapy Session Note  Patient Details  Name: [name] [name] MRN: [id number] Date of Birth: [birth year]  Today's Date: 01/21/2021 PT Individual Time: 1440-1533 PT Individual Time Calculation (min): 53 min   Short Term Goals: Week 2:  PT Short Term Goal 1 (Week 2): Pt will complete bed mobility with minA consistently. PT Short Term Goal 2 (Week 2): Pt will complete bed to chair transfer with minA consistently. PT Short Term Goal 3 (Week 2): Pt will ambulates 100' with minA and LRAD. PT Short Term Goal 4 (Week 2): Pt will perform Berg Balance assessment.  Skilled Therapeutic Interventions/Progress Updates:     Pt received seated in [facility] with RN present assisting from bathroom. Pt agreeable to PT and no indication of pain. WC transport to dayroom for time management. Pt participates in gait training over treadmill with litegait for bodyweight supported ambulation. Sit to stand to litegait with CGA. Pt requires modA for step up onto treadmill. Pt completes following bouts on treadmill  2:17 at 0.55mph increasing to 1.0 mph for 142'  1:35 at 1.0 mph decreasing to 0.8 mph for 132' 2:09 at 1.0 mph for 185'  Pt also trials backward ambulation on treadmill, 0.55 0.69mph for 24'. Pt has difficulty taking long enough strides backward to position feet under COG, keeping buttocks positioned posteriorly and in forward flexed posture. Activity performed for NMR and balance training.  Pt attempting to communicate something to PT during session and this PT has difficulty understanding pt. Eventually pt is able to slide shoes off independently, and appears much happier and relieved with shoes off. Unclear if this is what pt was trying to communicate but may need to adjust shoes for comfort in subsequent sessions.  WC transport back to room. Pt left seated in WC with alarm intact and all needs within reach.   Therapy Documentation Precautions:  Precautions Precautions: Fall,Other  (comment) Precaution Comments: O2 via trach collar; PEG tube, delayed processing/initiation Restrictions Weight Bearing Restrictions: No RUE Weight Bearing: Non weight bearing    Therapy/Group: Individual Therapy  Breck Coons, PT, DPT 01/21/2021, 3:45 PM

## 2021-01-21 NOTE — Progress Notes (Signed)
Occupational Therapy Session Note  Patient Details  Name: [name] [name] MRN: [id number] Date of Birth: [birth year]  Today's Date: 01/21/2021 OT Individual Time: 1308-6578 OT Individual Time Calculation (min): 55 min    Short Term Goals: Week 1:  OT Short Term Goal 1 (Week 1): Pt will maintain unsupported sitting balance during self-care tasks for 5 mins with supevision OT Short Term Goal 1 - Progress (Week 1): Met OT Short Term Goal 2 (Week 1): Pt will complete UB bathing with mod assist OT Short Term Goal 2 - Progress (Week 1): Progressing toward goal OT Short Term Goal 3 (Week 1): Pt will complete UB dressing with max assist OT Short Term Goal 3 - Progress (Week 1): Met OT Short Term Goal 4 (Week 1): Pt will complete toilet transfer with max assist of 1 caregiver OT Short Term Goal 4 - Progress (Week 1): Met OT Short Term Goal 5 (Week 1): Pt will complete UB bathing with mod assist  Skilled Therapeutic Interventions/Progress Updates:    Pt greeted semi-reclined in bed alert and nodded head "yes" to participating in OT. Pt came to sitting EOB with min A. Sit<>stand at EOB with min A, the min HHA to pivot over to the wc. Pt initiating use of R UE much more today and able to use B UEs together to wring out wash cloths. Pt also initiated using R hand to wash abdomen and chest without cues from OT! Pt did perseverate some on washing mostly chest and abdomen, but pt was able to follow commands more consistently to move on to next body part. Sit<>stand at the sink with min A, then pt followed commands to doff underwear in standing. Pt then was able to wash peri-area with min cues and OT assist to wash buttocks. CGA for balance in standing. Blocked practice for UB dressing. Pt with difficulty orienting shirt but was trying to don herself. Pt still needed max A overall for this task. Pt had the same difficulty orienting pants and maintaining sitting balance while reaching forward, requiring OT assist  to thread pant legs. Pt then achieved stand with min A and was able to help pull pants over hips without cues to initiate. Worked on R visual scanning during bathing tasks to locate items on L side of sink. Pt ambulated 5 feet back to bed with min HHA. OT handed pt unweighted dowel rod and she was able to follow commands to grasp with B UEs and performed 3 sets of 10 chest press. Pt left semi-reclined in bed with bed alarm on, call bell in reach, and needs met.   Therapy Documentation Precautions:  Precautions Precautions: Fall,Other (comment) Precaution Comments: O2 via trach collar; PEG tube, delayed processing/initiation Restrictions Weight Bearing Restrictions: No RUE Weight Bearing: Non weight bearing Pain:  denies pain  Therapy/Group: Individual Therapy  Valma Cava 01/21/2021, 12:41 PM

## 2021-01-22 LAB — GLUCOSE, CAPILLARY
Glucose-Capillary: 153 mg/dL — ABNORMAL HIGH (ref 70–99)
Glucose-Capillary: 153 mg/dL — ABNORMAL HIGH (ref 70–99)
Glucose-Capillary: 154 mg/dL — ABNORMAL HIGH (ref 70–99)
Glucose-Capillary: 155 mg/dL — ABNORMAL HIGH (ref 70–99)
Glucose-Capillary: 158 mg/dL — ABNORMAL HIGH (ref 70–99)
Glucose-Capillary: 169 mg/dL — ABNORMAL HIGH (ref 70–99)

## 2021-01-22 NOTE — Progress Notes (Addendum)
Nutrition Follow-up  DOCUMENTATION CODES:   Non-severe (moderate) malnutrition in context of acute illness/injury  INTERVENTION:   - RD will follow for results of MBS and adjust TF regimen as appropriate  Continue tube feeding via G-tube: - Osmolite 1.5 @ 65 ml/hr (tube feeding can be held for up to 4 hours for therapies) - ProSource TF 45 ml BID - Free water flushes of 50 ml q 4 hours  Tube feeding regimen at new goal rate provides 2030 kcal, 104 grams of protein, and 991 ml of H2O.   Total free water with flushes: 1291 ml  - MVI with minerals daily per tube  NUTRITION DIAGNOSIS:   Moderate Malnutrition related to acute illness (cardiac arrest with resulting anoxic brain injury and dysphagia) as evidenced by mild fat depletion,mild muscle depletion,percent weight loss (9.5% weight loss in less than 2 months).  New diagnosis after completion of NFPE  GOAL:   Patient will meet greater than or equal to 90% of their needs  Met via TF  MONITOR:   Diet advancement,Labs,Weight trends,TF tolerance,I & O's  REASON FOR ASSESSMENT:   Consult Enteral/tube feeding initiation and management,Assessment of nutrition requirement/status  ASSESSMENT:   55 year old female with PMH of HTN. [facility]20 with witnessed cardiac arrest. Pt required intubation for airway protection. Pt underwent tracheostomy on 12/13/20 as well as placement of G-tube. Pt remains NPO. Imaging studies consistent with anoxic brain injury. Admitted to CIR on 01/10/21.  Noted d/c date of 3/19. Also noted plan for MBS tomorrow.  Admit weight: 78.4 kg Current weight: 78 kg  Met with pt at bedside. Pt reports no issues with TF tolerance and denies any bloating, nausea, vomiting, or abdominal pain. Will continue with current TF regimen at this time and adjust if diet advanced after MBS.  Pt weighed 86.2 kg on 12/02/20. Current weight is 78 kg. This is an 8.2 kg (9.5%) overall weight loss in less  than 2 months which is severe and significant for timeframe. Pt meets criteria for moderate malnutrition after completion of NFPE.  Current TF: Osmolite 1.5 @ 65 ml/hr, ProSource TF 45 ml BID, free water flushes 50 ml q 4 hours  Medications reviewed and include: ritalin, MVI with minerals, klor-con, florastor  Labs reviewed. CBG's: 137-192 x 24 hours  NUTRITION - FOCUSED PHYSICAL EXAM:  Flowsheet Row Most Recent Value  Orbital Region Mild depletion  Upper Arm Region No depletion  Thoracic and Lumbar Region No depletion  Buccal Region Mild depletion  Temple Region Mild depletion  Clavicle Bone Region Mild depletion  Clavicle and Acromion Bone Region Mild depletion  Scapular Bone Region Unable to assess  Dorsal Hand Mild depletion  Patellar Region Mild depletion  Anterior Thigh Region Mild depletion  Posterior Calf Region Moderate depletion  Edema (RD Assessment) None  Hair Reviewed  Eyes Reviewed  Mouth Reviewed  Skin Reviewed  Nails Reviewed       Diet Order:   Diet Order            Diet NPO time specified  Diet effective now                 EDUCATION NEEDS:   No education needs have been identified at this time  Skin:  Skin Assessment: Skin Integrity Issues: Incisions: neck  Last BM:  01/21/21 large type 6  Height:   Ht Readings from Last 1 Encounters:  01/10/21 _0  (1.626 m)    Weight:   Wt Readings from  Last 1 Encounters:  01/22/21 78 kg    BMI:  Body mass index is 29.52 kg/m.  Estimated Nutritional Needs:   Kcal:  1700-1900  Protein:  85-100 grams  Fluid:  1.7-1.9 L    Gustavus Bryant, MS, RD, LDN Inpatient Clinical Dietitian Please see AMiON for contact information.

## 2021-01-22 NOTE — Progress Notes (Signed)
Physical Therapy Session Note  Patient Details  Name: [name] [name] MRN: [id number] Date of Birth: 10/14/1966  Today's Date: 01/22/2021 PT Individual Time: 4098-1191 PT Individual Time Calculation (min): 48 min   Short Term Goals: Week 2:  PT Short Term Goal 1 (Week 2): Pt will complete bed mobility with minA consistently. PT Short Term Goal 2 (Week 2): Pt will complete bed to chair transfer with minA consistently. PT Short Term Goal 3 (Week 2): Pt will ambulates 100' with minA and LRAD. PT Short Term Goal 4 (Week 2): Pt will perform Berg Balance assessment.  Skilled Therapeutic Interventions/Progress Updates:     Pt received supine in bed and agrees to therapy. No indication of pain. Supine to sit with minA. PT threads pants over pt's legs. Pt performs sit to stand with CGA and PT provides modA to assist pulling up pants. Stand step transfer to [facility] with minA. WC transport to gym for time management. Pt performs visual scanning task with BITS system, working on standing balance, attention to task, and cognition. Pt requires CGA/minA for standing balance, and is able to reach toward targets but consistently touches several inches below target, despite consistent cueing. Pt then performs BERG balance assessment, as detailed below. When reaching for object on ground, pt also reaches several inches proximal to target and is unable to make correction, despite cueing. WC transport back to room. Stand step transfer back to bed with minA. ModA for sit to supine due to difficulty sequencing. Left with alarm intact and all needs within reach.  Therapy Documentation Precautions:  Precautions Precautions: Fall,Other (comment) Precaution Comments: O2 via trach collar; PEG tube, delayed processing/initiation Restrictions Weight Bearing Restrictions: No RUE Weight Bearing: Non weight bearing Balance: Balance Balance Assessed: Yes Standardized Balance Assessment Standardized Balance Assessment: Berg  Balance Test Berg Balance Test Sit to Stand: Able to stand  independently using hands Standing Unsupported: Able to stand 2 minutes with supervision Sitting with Back Unsupported but Feet Supported on Floor or Stool: Able to sit 2 minutes under supervision Stand to Sit: Controls descent by using hands Transfers: Able to transfer with verbal cueing and /or supervision Standing Unsupported with Eyes Closed: Able to stand 10 seconds with supervision Standing Ubsupported with Feet Together: Needs help to attain position and unable to hold for 15 seconds From Standing, Reach Forward with Outstretched Arm: Reaches forward but needs supervision From Standing Position, Pick up Object from Floor: Unable to pick up and needs supervision From Standing Position, Turn to Look Behind Over each Shoulder: Needs supervision when turning Turn 360 Degrees: Needs assistance while turning Standing Unsupported, Alternately Place Feet on Step/Stool: Needs assistance to keep from falling or unable to try Standing Unsupported, One Foot in Front: Loses balance while stepping or standing Standing on One Leg: Unable to try or needs assist to prevent fall Total Score: 20   Therapy/Group: Individual Therapy  Breck Coons, PT, DPT 01/22/2021, 4:24 PM

## 2021-01-22 NOTE — Progress Notes (Signed)
   01/22/21 1420  RT Progression Team  Trach Progression Decannulation  O2 Device Room Air  FiO2 (%) 21 %  SpO2 98 %  Education Completed (Instructions given to pt to tuck chin when swallowing with meals until the stoma closes.)  Who was educated? Patient;Caregiver  [REMOVED] Tracheostomy Shiley Flexible 4 mm Uncuffed  Removal Date/Time: 01/22/21 1420  Placement Date/Time: 01/14/21 1214   Placed By: Self  Brand: Shiley Flexible  Size (mm): 4 mm  Style: Uncuffed  Site Assessment Dry;Air leak  Site Care Dressing applied (Stoma care completed.)  Ties Assessment  (Removed.)

## 2021-01-22 NOTE — Progress Notes (Signed)
Speech Language Pathology Daily Session Note  Patient Details  Name: [name] [name] MRN: [id number] Date of Birth: 1966-01-02  Today's Date: 01/22/2021 SLP Individual Time: 6967-8938 SLP Individual Time Calculation (min): 55 min  Short Term Goals: Week 2: SLP Short Term Goal 1 (Week 2): Patient will tolerate PMSV for 60 minutes with all vitals remaining WFL and without evidence of back pressure with supervision. SLP Short Term Goal 2 (Week 2): Patient will vocalize at the word level in 50% of opportunities with Max A multimodal cues. SLP Short Term Goal 3 (Week 2): Patient will follow 1-step commands with Max A multimodal cues in75% of opportunities. SLP Short Term Goal 4 (Week 2): Patient will focus attention to basic tasks for 2 minutes with Mod A multimodal cues. SLP Short Term Goal 5 (Week 2): Patient will consume trials of ice chips with minimal overt s/s of aspiration and Min verbal cues for AP transit and swallow initation over 2 sessions to assess readiness for MBS.  Skilled Therapeutic Interventions: Skilled treatment session focused on dysphagia and communication goals. Patient's trach was capped at beginning of session, however, RT present and decannulated patient. Patient with minimal air leakage from stoma and required overall Mod A verbal cues for use of short phrases and an increased vocal intensity to achieve ~75% intelligibility. Patient's overall vocal quality remains hoarse. Patient consumed trials of ice chips with appropriate AP transit and oral manipulation without overt s/s of aspiration. Recommend MBS tomorrow to assess swallow function. SLP also focused on self feeding which appeared difficulty due to suspected visual deficits and apraxia. Patient attempted to perform all self-feeding tasks with both hands but was able to bring the spoon to her mouth with her left upper extremity (non-dominant hand) while holding the cup in her right hand. However, patient unable to scoop ice  from cup. During session, patient spontaneously started crying twice, patient reports due to missing her family. SLP also suspects due to increased awareness of current situation.  Patient left upright in bed with alarm on and all needs within reach. Continue with current plan of care.      Pain No/Denies Pain   Therapy/Group: Individual Therapy  PAYNE, COURTNEY 01/22/2021, 3:26 PM

## 2021-01-22 NOTE — Progress Notes (Signed)
PROGRESS NOTE   Subjective/Complaints:  Pt did well last night. Tolerated trach capping. No issues this morning  ROS: Limited due to cognitive/behavioral    Objective:   No results found. No results for input(s): WBC, HGB, HCT, PLT in the last 72 hours. No results for input(s): NA, K, CL, CO2, GLUCOSE, BUN, CREATININE, CALCIUM in the last 72 hours.  Intake/Output Summary (Last 24 hours) at 01/22/2021 0845 Last data filed at 01/21/2021 1825 Gross per 24 hour  Intake 0 ml  Output --  Net 0 ml        Physical Exam: Vital Signs Blood pressure 130/86, pulse 86, temperature 97.9 F (36.6 C), resp. rate 18, height 5\' 4"  (1.626 m), weight 78 kg, SpO2 95 %.     Constitutional: No distress . Vital signs reviewed. HEENT: EOMI, oral membranes moist Neck: trach capped. dysphonic Cardiovascular: RRR without murmur. No JVD    Respiratory/Chest: CTA Bilaterally without wheezes or rales. Normal effort    GI/Abdomen: BS +, non-tender, non-distended Ext: no clubbing, cyanosis, or edema Psych: pleasant and cooperative Musculoskeletal:     Comments: No edema or tenderness in extremities  Neurological:     alert. Follows basic commands. Moves all 4's.  Assessment/Plan: 1. Functional deficits which require 3+ hours per day of interdisciplinary therapy in a comprehensive inpatient rehab setting.  Physiatrist is providing close team supervision and 24 hour management of active medical problems listed below.  Physiatrist and rehab team continue to assess barriers to discharge/monitor patient progress toward functional and medical goals  Care Tool:  Bathing        Body parts bathed by helper: Right arm,Left arm,Chest,Abdomen,Front perineal area,Buttocks,Right upper leg,Left upper leg,Right lower leg,Left lower leg,Face     Bathing assist Assist Level: Dependent - Patient 0%     Upper Body Dressing/Undressing Upper body  dressing   What is the patient wearing?: Pull over shirt    Upper body assist Assist Level: Dependent - Patient 0%    Lower Body Dressing/Undressing Lower body dressing      What is the patient wearing?: Incontinence brief,Pants     Lower body assist Assist for lower body dressing: 2 Helpers     Toileting Toileting    Toileting assist Assist for toileting: 2 Helpers (2 helpers to turn pt in bed)     Transfers Chair/bed transfer  Transfers assist  Chair/bed transfer activity did not occur: Safety/medical concerns  Chair/bed transfer assist level: Moderate Assistance - Patient 50 - 74%     Locomotion Ambulation   Ambulation assist   Ambulation activity did not occur: Safety/medical concerns  Assist level: Total Assistance - Patient < 25% Assistive device: Lite Gait Max distance: 185'   Walk 10 feet activity   Assist  Walk 10 feet activity did not occur: Safety/medical concerns  Assist level: Total Assistance - Patient < 25% Assistive device: Lite Gait   Walk 50 feet activity   Assist Walk 50 feet with 2 turns activity did not occur: Safety/medical concerns  Assist level: Moderate Assistance - Patient - 50 - 74%      Walk 150 feet activity   Assist Walk 150 feet activity did not  occur: Safety/medical concerns  Assist level: Total Assistance - Patient < 25% Assistive device: Lite Gait    Walk 10 feet on uneven surface  activity   Assist Walk 10 feet on uneven surfaces activity did not occur: Safety/medical concerns         Wheelchair     Assist Will patient use wheelchair at discharge?: No (Per PT long-term goals)             Wheelchair 50 feet with 2 turns activity    Assist            Wheelchair 150 feet activity     Assist          Blood pressure 130/86, pulse 86, temperature 97.9 F (36.6 C), resp. rate 18, height 5\' 4"  (1.626 m), weight 78 kg, SpO2 95 %.  Medical Problem List and Plan: 1.  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  Antithrombotics: -DVT/anticoagulation: SCDs             -antiplatelet therapy: N/A 3. Pain Management:   -off oxycodone  2/28 on tylenol alone for pain and doing well 4. Mood: Demonstrates no anxiety or agitation, has been alert during the day, d/c Klonopin.               -antipsychotic agents: Seroquel 25 mg nightly                         This medication is necessary. 5. Neuropsych: This patient is not capable of making decisions on her own behalf.  -Continue ritalin trial to help attention and initation  2/28--increased ritalin to 10mg --attention better--continue 6. Skin/Wound Care: Routine skin checks 7. Fluids/Electrolytes/Nutrition: TF at 55cc/hr 8.  Seizure prophylaxis.  Keppra 1000 mg twice daily.  EEG negative 9.  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    - downsized to #4 trach cuffless 2/21   -3/2 tolerating capping x 48 hours. decannulate today 10.  Dysphagia with decreased nutritional storage.  Gastrostomy tube 12/13/2020 per Dr. Andrey Farmer.  .              -remains NPO, continue current TF  -MBS 3/3 11.  Aspiration pneumonia.  5-day course of Unasyn completed. 12.  Hypertension.  Well controlled, continue Norvasc 10 mg daily, Lopressor 25 mg twice daily.  Patient would follow-up cardiology services.             controlled 2/22  2/28- BP well controlled 14.  Morbid obesity.  BMI 31.47.  Dietary follow-up 15.  Prediabetes.  Hemoglobin A1c 5.9.  CBGs monitored while on tube feeds             Moderate increased  2/27- BGs 127-179- monitor closely since elevated 16. Tachycardia:  3/1 better controlled with propranolol--continue current dose 17. Loose stool:  -improved off reglan---can be a little mushy at times  LOS: 12 days A FACE TO FACE EVALUATION WAS  PERFORMED  Meredith Staggers 01/22/2021, 8:45 AM

## 2021-01-22 NOTE — Progress Notes (Signed)
Occupational Therapy Session Note  Patient Details  Name: [name] [name] MRN: [id number] Date of Birth: 06/18/1966  Today's Date: 01/22/2021 OT Individual Time: 1300-1345 OT Individual Time Calculation (min): 45 min    Short Term Goals: Week 1:  OT Short Term Goal 1 (Week 1): Pt will maintain unsupported sitting balance during self-care tasks for 5 mins with supevision OT Short Term Goal 1 - Progress (Week 1): Met OT Short Term Goal 2 (Week 1): Pt will complete UB bathing with mod assist OT Short Term Goal 2 - Progress (Week 1): Progressing toward goal OT Short Term Goal 3 (Week 1): Pt will complete UB dressing with max assist OT Short Term Goal 3 - Progress (Week 1): Met OT Short Term Goal 4 (Week 1): Pt will complete toilet transfer with max assist of 1 caregiver OT Short Term Goal 4 - Progress (Week 1): Met OT Short Term Goal 5 (Week 1): Pt will complete UB bathing with mod assist  Skilled Therapeutic Interventions/Progress Updates:    1;1. Pt received in bed reporting need to toilet. Despite MIN A and increased time for transfer and VC for scooting back onto BSC pt unable to void, however new brief donned and pt able to wash peri area for cleanliness and pull up pants with MOD A. Pt completes stand pivot to/from w/c with MIN and increased time to iniate pivot steps. Pt very distracted by sink during toileting and when brought to sink pt requires step by step cues to wash hands d/t perseveration on turning water on/off. Pt folds towels with max fading to MOD A for BUE coordination, dexterity and visual perception. Pt requires max tactile cues to RUE for attention and reaching R to obtain towels with RUE with extra time. Exited session with pt seated in bed, exit alarm on and call light in reach   Therapy Documentation Precautions:  Precautions Precautions: Fall,Other (comment) Precaution Comments: O2 via trach collar; PEG tube, delayed processing/initiation Restrictions Weight  Bearing Restrictions: No RUE Weight Bearing: Non weight bearing General:   Vital Signs: Therapy Vitals Temp: 97.9 F (36.6 C) Pulse Rate: 89 Resp: 20 BP: 130/86 Patient Position (if appropriate): Lying Oxygen Therapy SpO2: 94 % O2 Device: Room Air FiO2 (%): 21 % Pain:   ADL: ADL Eating: NPO Grooming: Dependent Where Assessed-Grooming: Edge of bed Upper Body Bathing: Dependent Where Assessed-Upper Body Bathing: Edge of bed Lower Body Bathing: Dependent Where Assessed-Lower Body Bathing: Bed level Upper Body Dressing: Dependent Where Assessed-Upper Body Dressing: Edge of bed Lower Body Dressing: Dependent Where Assessed-Lower Body Dressing: Bed level Vision   Perception    Praxis   Exercises:   Other Treatments:     Therapy/Group: Individual Therapy  Stephanie M Schlosser 01/22/2021, 6:46 AM  

## 2021-01-23 ENCOUNTER — Inpatient Hospital Stay (HOSPITAL_COMMUNITY): Payer: Self-pay

## 2021-01-23 DIAGNOSIS — E440 Moderate protein-calorie malnutrition: Secondary | ICD-10-CM | POA: Insufficient documentation

## 2021-01-23 LAB — GLUCOSE, CAPILLARY
Glucose-Capillary: 119 mg/dL — ABNORMAL HIGH (ref 70–99)
Glucose-Capillary: 120 mg/dL — ABNORMAL HIGH (ref 70–99)
Glucose-Capillary: 139 mg/dL — ABNORMAL HIGH (ref 70–99)
Glucose-Capillary: 161 mg/dL — ABNORMAL HIGH (ref 70–99)
Glucose-Capillary: 178 mg/dL — ABNORMAL HIGH (ref 70–99)

## 2021-01-23 NOTE — Progress Notes (Signed)
Occupational Therapy Session Note  Patient Details  Name: [name] [name] MRN: [id number] Date of Birth: Jan 01, 1966  Today's Date: 01/23/2021 OT Individual Time: 1005-1045 OT Individual Time Calculation (min): 40 min    Short Term Goals: Week 2:  OT Short Term Goal 1 (Week 2): Pt will complete 1 step of LB dressing task OT Short Term Goal 2 (Week 2): Pt will maintain standing balance at the sink with Min A in preparation for BADL tas  Skilled Therapeutic Interventions/Progress Updates:    Pt greeted semi-reclined in bed with nursing staff trying to figure out new posey bed belt. OT had pt come to sitting EOB with increased time and mod A 2/2 distraction from multiple people in the room. Min A stand-pivot to wc. Room cleared out and pt brought to the sink for bathing/dressing tasks. Pt initiating use of R UE much better, wringing out wash cloths and reaching for objects with R hand. Pt often over or under reaches for objects, will try to check vision at next session now that pt is able to communicate a little better. Pt needed perseverative on wshing chest requiring cues to move on to next body part. Sit<>stand at the sink with min A, then pt able to wash peri-area, OT assist to wash buttocks. Pt very apraxic with dressing tasks requiring max A for LB dressing, but she did initiate pulling pants  Up without cues. Pt needed assistance to thread B UEs, but did initiate pulling overhead, but still needed assistance 2.2 apraxia. Pt completed stand-pivot back to bed with min A. Pt left semi-reclined in bed with bed alarm on, and bed alarm belt fastened.   Therapy Documentation Precautions:  Pain: Pain Assessment Pain Scale: Faces Faces Pain Scale: No hurt   Therapy/Group: Individual Therapy  Valma Cava 01/23/2021, 10:51 AM

## 2021-01-23 NOTE — Progress Notes (Signed)
Physical Therapy Session Note  Patient Details  Name: SHOSHANAH DAPPER MRN: [id number] Date of Birth: 1966/02/26  Today's Date: 01/23/2021 PT Individual Time: 1779-3903 PT Individual Time Calculation (min): 56 min   Short Term Goals: Week 2:  PT Short Term Goal 1 (Week 2): Pt will complete bed mobility with minA consistently. PT Short Term Goal 2 (Week 2): Pt will complete bed to chair transfer with minA consistently. PT Short Term Goal 3 (Week 2): Pt will ambulates 100' with minA and LRAD. PT Short Term Goal 4 (Week 2): Pt will perform Berg Balance assessment.  Skilled Therapeutic Interventions/Progress Updates:     Pt received supine in bed and agrees to therapy. No indication of pain. Supine to sit with supervision and cues for positioning. Stand step transfer to [facility] with minA. Stand to sit throughout session requires anywhere from minA to [place] due to pt's motor planning deficits. Pt attempts to turn and ambulate around chairs when cued to sit in them. WC transport outside for gait training in open environment with varying and unlevel surfaces. Pt ambulates x100, x150', x100', and x150' with CGA/minA HHA. Seated rest breaks between each bout. After coming inside, pt stands and cued to reach for targets with numbers written on them. Pt is able to correctly identify and reach for numbers >75% of time. Pt progresses to reaching for consecutive numbers to "add to a total", and completes correctly 1 out of 2 trials. Upon return to room, pt performs stand step transfer to bed with minA. MinA/modA for sit to supine. Pt left supine in bed with posey belt and alarm intact, all needs within reach.  Therapy Documentation Precautions:  Precautions Precautions: Fall,Other (comment) Precaution Comments: O2 via trach collar; PEG tube, delayed processing/initiation Restrictions Weight Bearing Restrictions: No RUE Weight Bearing: Non weight bearing  Therapy/Group: Individual Therapy  Breck Coons, PT,  DPT 01/23/2021, 4:19 PM

## 2021-01-23 NOTE — Progress Notes (Signed)
Speech Language Pathology Daily Session Note  Patient Details  Name: TALASIA SAULTER MRN: [id number] Date of Birth: 04-Mar-1966  Today's Date: 01/23/2021 SLP Individual Time: 1130-1200 SLP Individual Time Calculation (min): 30 min  Short Term Goals: Week 2: SLP Short Term Goal 1 (Week 2): Patient will tolerate PMSV for 60 minutes with all vitals remaining WFL and without evidence of back pressure with supervision. SLP Short Term Goal 2 (Week 2): Patient will vocalize at the word level in 50% of opportunities with Max A multimodal cues. SLP Short Term Goal 3 (Week 2): Patient will follow 1-step commands with Max A multimodal cues in75% of opportunities. SLP Short Term Goal 4 (Week 2): Patient will focus attention to basic tasks for 2 minutes with Mod A multimodal cues. SLP Short Term Goal 5 (Week 2): Patient will consume trials of ice chips with minimal overt s/s of aspiration and Min verbal cues for AP transit and swallow initation over 2 sessions to assess readiness for MBS.  Skilled Therapeutic Interventions: Skilled treatment session focused on dysphagia and speech goals. SLP facilitated session by providing skilled observation with lunch meal of dys. 1 textures with thin liquids. Patient able to self-feed liquids with overall Mod A verbal and tactile cues. Patient with cough on initial sip but no further overt s/s of aspiration observed. Patient with minimal PO intake which she reported was due to not feeling hungry. Patient attempted to self-feed with minimal success due to utilization of bilateral upper extremities from motor planning issues. Therefore, patient required total A for self-feeding. Recommend patient continue current diet. Patient with improved vocal intensity this session requiring overall Mod A verbal cues. Patient became emotional at end of session and left at RN station. Continue with current plan of care.      Pain No/Denies Pain   Therapy/Group: Individual  Therapy  Destyne Goodreau 01/23/2021, 2:58 PM

## 2021-01-23 NOTE — Progress Notes (Signed)
Modified Barium Swallow Progress Note  Patient Details  Name: [name] [name] MRN: [id number] Date of Birth: 09/10/1966  Today's Date: 01/23/2021  Modified Barium Swallow completed.  Full report located under Chart Review in the Imaging Section.  Brief recommendations include the following:  Clinical Impression  Patient demonstrates a moderate oral and mild pharyngeal dysphagia. Patient's oral phase is characterized by decreased bolus cohesion, piecemeal swallowing, and prolonged AP transit. Patient consistently triggered a swallow at the pyriform sinuses with liquids and valleculae with solid textures.  Despite delay, patient was able to fully protect her airway. Attempted to challenge the patient with sequential sips but unable to perform due to cognitive and motor planning deficits. Motor planning also intermittently impacted patient's efficiency with the straw.  Recommend patient initiate a diet of Dys. 1 textures with thin liquids via cup. Patient will need full supervision, consume all meals OOB and with limited distractions. Patient educated on results but will need reinforcement.    Swallow Evaluation Recommendations       SLP Diet Recommendations: Dysphagia 1 (Puree) solids;Thin liquid   Liquid Administration via: Cup   Medication Administration: Crushed with puree   Supervision: Staff to assist with self feeding;Full supervision/cueing for compensatory strategies   Compensations: Minimize environmental distractions;Slow rate;Small sips/bites   Postural Changes:  (OOB for meals)   Oral Care Recommendations: Oral care BID        Maven Rosander 01/23/2021,2:55 PM

## 2021-01-23 NOTE — Progress Notes (Addendum)
PROGRESS NOTE   Subjective/Complaints:  Up in bed. Alert. Denies pain or breathing issues. Tolerated trach removal without any problems  ROS: Limited due to cognitive/behavioral    Objective:   No results found. No results for input(s): WBC, HGB, HCT, PLT in the last 72 hours. No results for input(s): NA, K, CL, CO2, GLUCOSE, BUN, CREATININE, CALCIUM in the last 72 hours. No intake or output data in the 24 hours ending 01/23/21 0948      Physical Exam: Vital Signs Blood pressure 128/76, pulse 72, temperature 99.1 F (37.3 C), temperature source Oral, resp. rate 18, height 5\' 4"  (1.626 m), weight 79.8 kg, SpO2 99 %.     Constitutional: No distress . Vital signs reviewed. HEENT: EOMI, oral membranes moist Neck: trach stoma almost closed already. Aphonic/Dysphonic but phonates at times when cued to speak louder Cardiovascular: RRR without murmur. No JVD    Respiratory/Chest: CTA Bilaterally without wheezes or rales. Normal effort    GI/Abdomen: BS +, non-tender, non-distended Ext: no clubbing, cyanosis, or edema Psych: pleasant and a little distracted Musculoskeletal:     Comments: No edema or tenderness in extremities  Neurological:     alert. Follows basic commands. Moves all 4's.  Assessment/Plan: 1. Functional deficits which require 3+ hours per day of interdisciplinary therapy in a comprehensive inpatient rehab setting.  Physiatrist is providing close team supervision and 24 hour management of active medical problems listed below.  Physiatrist and rehab team continue to assess barriers to discharge/monitor patient progress toward functional and medical goals  Care Tool:  Bathing        Body parts bathed by helper: Right arm,Left arm,Chest,Abdomen,Front perineal area,Buttocks,Right upper leg,Left upper leg,Right lower leg,Left lower leg,Face     Bathing assist Assist Level: Dependent - Patient 0%      Upper Body Dressing/Undressing Upper body dressing   What is the patient wearing?: Pull over shirt    Upper body assist Assist Level: Dependent - Patient 0%    Lower Body Dressing/Undressing Lower body dressing      What is the patient wearing?: Incontinence brief,Pants     Lower body assist Assist for lower body dressing: 2 Helpers     Toileting Toileting    Toileting assist Assist for toileting: 2 Helpers (2 helpers to turn pt in bed)     Transfers Chair/bed transfer  Transfers assist  Chair/bed transfer activity did not occur: Safety/medical concerns  Chair/bed transfer assist level: Moderate Assistance - Patient 50 - 74%     Locomotion Ambulation   Ambulation assist   Ambulation activity did not occur: Safety/medical concerns  Assist level: Total Assistance - Patient < 25% Assistive device: Lite Gait Max distance: 185'   Walk 10 feet activity   Assist  Walk 10 feet activity did not occur: Safety/medical concerns  Assist level: Total Assistance - Patient < 25% Assistive device: Lite Gait   Walk 50 feet activity   Assist Walk 50 feet with 2 turns activity did not occur: Safety/medical concerns  Assist level: Moderate Assistance - Patient - 50 - 74%      Walk 150 feet activity   Assist Walk 150 feet activity did not  occur: Safety/medical concerns  Assist level: Total Assistance - Patient < 25% Assistive device: Lite Gait    Walk 10 feet on uneven surface  activity   Assist Walk 10 feet on uneven surfaces activity did not occur: Safety/medical concerns         Wheelchair     Assist Will patient use wheelchair at discharge?: No (Per PT long-term goals)             Wheelchair 50 feet with 2 turns activity    Assist            Wheelchair 150 feet activity     Assist          Blood pressure 128/76, pulse 72, temperature 99.1 F (37.3 C), temperature source Oral, resp. rate 18, height 5\' 4"  (1.626 m),  weight 79.8 kg, SpO2 99 %.  Medical Problem List and Plan: 1.  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  Antithrombotics: -DVT/anticoagulation: SCDs             -antiplatelet therapy: N/A 3. Pain Management:   -off oxycodone  2/28 on tylenol alone for pain and doing well 4. Mood: Demonstrates no anxiety or agitation, has been alert during the day, d/c Klonopin.               -antipsychotic agents: Seroquel 25 mg nightly                         This medication is necessary. 5. Neuropsych: This patient is not capable of making decisions on her own behalf.  -Continue ritalin trial to help attention and initation  continue ritalin  10mg  at 0700 and 1200--attention better-  6. Skin/Wound Care: Routine skin checks 7. Fluids/Electrolytes/Nutrition: TF at 55cc/hr 8.  Seizure prophylaxis.  Keppra 1000 mg twice daily.  EEG negative 9.  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    - decannulated 3/2 without any problems. Stoma almost closed 10.  Dysphagia with decreased nutritional storage.  Gastrostomy tube 12/13/2020 per Dr. Andrey Farmer.  .              -remains NPO, continue current TF  -MBS 3/3 today 11.  Aspiration pneumonia.  5-day course of Unasyn completed. 12.  Hypertension.  Well controlled, continue Norvasc 10 mg daily, Lopressor 25 mg twice daily.  Patient would follow-up cardiology services.             controlled 2/22  3/3- BP well controlled 14.  Morbid obesity.  BMI 31.47.  Dietary follow-up 15.  Prediabetes.  Hemoglobin A1c 5.9.  CBGs monitored while on tube feeds             Moderate increased  2/27- BGs 127-179- monitor closely since elevated 16. Tachycardia:  3/1 better controlled with propranolol--continue current dose 17. Loose stool:  -improved off reglan---can be a little mushy at times 18. Low  grade temp today: observe for now  LOS: 13 days A FACE TO FACE EVALUATION WAS PERFORMED  Meredith Staggers 01/23/2021, 9:48 AM

## 2021-01-24 LAB — GLUCOSE, CAPILLARY
Glucose-Capillary: 112 mg/dL — ABNORMAL HIGH (ref 70–99)
Glucose-Capillary: 118 mg/dL — ABNORMAL HIGH (ref 70–99)
Glucose-Capillary: 147 mg/dL — ABNORMAL HIGH (ref 70–99)
Glucose-Capillary: 152 mg/dL — ABNORMAL HIGH (ref 70–99)
Glucose-Capillary: 158 mg/dL — ABNORMAL HIGH (ref 70–99)
Glucose-Capillary: 158 mg/dL — ABNORMAL HIGH (ref 70–99)
Glucose-Capillary: 189 mg/dL — ABNORMAL HIGH (ref 70–99)

## 2021-01-24 MED ORDER — PROSOURCE PLUS PO LIQD
30.0000 mL | Freq: Two times a day (BID) | ORAL | Status: DC
  Administered 2021-01-24 – 2021-02-08 (×30): 30 mL via ORAL
  Filled 2021-01-24 (×28): qty 30

## 2021-01-24 MED ORDER — ENSURE ENLIVE PO LIQD
237.0000 mL | Freq: Two times a day (BID) | ORAL | Status: DC
  Administered 2021-01-24 – 2021-02-07 (×29): 237 mL via ORAL
  Filled 2021-01-24: qty 237

## 2021-01-24 MED ORDER — FREE WATER
50.0000 mL | Freq: Four times a day (QID) | Status: DC
  Administered 2021-01-24 – 2021-02-04 (×42): 50 mL

## 2021-01-24 MED ORDER — OSMOLITE 1.5 CAL PO LIQD
900.0000 mL | ORAL | Status: DC
  Administered 2021-01-24 – 2021-01-27 (×4): 900 mL
  Filled 2021-01-24 (×2): qty 948

## 2021-01-24 NOTE — Progress Notes (Signed)
Occupational Therapy Session Note  Patient Details  Name: [name] [name] MRN: [id number] Date of Birth: 12/10/1965  Today's Date: 01/24/2021 OT Individual Time: 1000-1100 OT Individual Time Calculation (min): 60 min    Short Term Goals: Week 2:  OT Short Term Goal 1 (Week 2): Pt will complete 1 step of LB dressing task OT Short Term Goal 2 (Week 2): Pt will maintain standing balance at the sink with Min A in preparation for BADL tas  Skilled Therapeutic Interventions/Progress Updates:    Pt greeted semi-reclined in bed, nursing unhooked pt from tube feed. Pt completed bed mobility with min A. Min A stand-pivot to wc. OT asked pt if she needed to go to the bathroom. Pt shook her head "no" and voiced "wash." Bathing completed sit<>stand from wc at the sink. Continued focus on locating items at midline and to the R with MAX multimodal cues. Pt often perseverating on turning the water on on the L side, under shooting and overshooting when asked to try to locate items at midline. Pt needed hand over hand to move on to next body part when washing. Utilized backwards chaining to help problem solve dressing tasks, but pt still needed max A for LB dressing 2/2 apraxia and perseverating on reaching for something on the floor on the L side of wc. OT tried threading pt's head first for her, she was then able to put L UE Through sleeve, but would not even attempt to put R arm through even with max cues. Pt with poor awareness of R side body and space. Pt brought to therapy gym and worked on visual scanning and functional use of R UE with BITS system. Pt needed min cues to visually scan to the R with large size 10 blue dots on black contrast. Pt also initiated reach with R UE, and was able to actively assist with her L hand to get to dots that were higher than her R UE reach. Pt returned to room to handoff to SLP for next therapy session.   Therapy Documentation Precautions:  Precautions Precautions: Fall,Other  (comment) Precaution Comments: O2 via trach collar; PEG tube, delayed processing/initiation Restrictions Weight Bearing Restrictions: No RUE Weight Bearing: Non weight bearing Pain: Pain Assessment Pain Scale: 0-10 Pain Score: 0-No pain   Therapy/Group: Individual Therapy  Valma Cava 01/24/2021, 10:43 AM

## 2021-01-24 NOTE — Progress Notes (Signed)
Physical Therapy Session Note  Patient Details  Name: [name] [name] MRN: [id number] Date of Birth: 1966-09-11  Today's Date: 01/24/2021 PT Individual Time: 1500-1525 PT Individual Time Calculation (min): 25 min   Short Term Goals: Week 2:  PT Short Term Goal 1 (Week 2): Pt will complete bed mobility with minA consistently. PT Short Term Goal 2 (Week 2): Pt will complete bed to chair transfer with minA consistently. PT Short Term Goal 3 (Week 2): Pt will ambulates 100' with minA and LRAD. PT Short Term Goal 4 (Week 2): Pt will perform Berg Balance assessment.  Skilled Therapeutic Interventions/Progress Updates:    Patient received reclined in bed, agreeable to PT. She denies pain. PT handing patient hospital socks and she was able to initiate donning them, but was not accurate in lining up the sock. Ultimately ModA provided by PT to don socks while seated in bed. She was able to come sit edge of bed with supervision and HOB elevated. PT setting up wc for blocked practice of stand pivot transfers to/from wc. PT cuing patient to "come sit in the wc" and she was able to initiate standing with light CGA and HHA, but she would pivot and not sit in the wheelchair. Patient pivoting back to bed and sat on the bed. PT cuing patient to transfer to wc again and she was able to stand with CGA, but turned to face wc instead. PT inquiring if the chair was uncomfortable and patient did not respond either positively or negatively. PT removed seat cushion in an attempt. Patient still would turn to face wc and not sit in it. PT replacing cushion on wc and placing target over it attempting to have patient sit to target. Patient continued to demonstrate significant motor planning deficits limiting her ability to safely transfer to wc. Patient able to successfully motor plan returning to supine in bed. Posey belt re-applied, 4 bed rails up per seizure protocol, call light within reach.   Therapy  Documentation Precautions:  Precautions Precautions: Fall,Other (comment) Precaution Comments: O2 via trach collar; PEG tube, delayed processing/initiation Restrictions Weight Bearing Restrictions: No RUE Weight Bearing: Non weight bearing       Therapy/Group: Individual Therapy  Karoline Caldwell, PT, DPT, CBIS  01/24/2021, 7:45 AM

## 2021-01-24 NOTE — Progress Notes (Signed)
PROGRESS NOTE   Subjective/Complaints:  Up in bathroom with NT. No new issues. Seems to be in good spirits  ROS: Limited due to cognitive/behavioral     Objective:   DG Swallowing Func-Speech Pathology  Result Date: 01/23/2021 Objective Swallowing Evaluation: Type of Study: MBS-Modified Barium Swallow Study  Patient Details Name: [name] [name] MRN: [id number] Date of Birth: June 28, 1966 Today's Date: 01/23/2021 Past Medical History: Past Medical History: Diagnosis Date . Hypertension  Past Surgical History: Past Surgical History: Procedure Laterality Date . PEG PLACEMENT N/A 12/13/2020  Procedure: PERCUTANEOUS ENDOSCOPIC GASTROSTOMY (PEG) PLACEMENT;  Surgeon: Lesly Rubenstein, MD;  Location: [facility] ENDOSCOPY;  Service: Endoscopy;  Laterality: N/A; . TRACHEOSTOMY TUBE PLACEMENT N/A 12/13/2020  Procedure: TRACHEOSTOMY;  Surgeon: Clyde Canterbury, MD;  Location: [facility] ORS;  Service: ENT;  Laterality: N/A; HPI: See H&P  No data recorded Assessment / Plan / Recommendation CHL IP CLINICAL IMPRESSIONS 01/23/2021 Clinical Impression Patient demonstrates a moderate oral and mild pharyngeal dysphagia. Patient's oral phase is characterized by decreased bolus cohesion, piecemeal swallowing, and prolonged AP transit. Patient consistently triggered a swallow at the pyriform sinuses with liquids and valleculae with solid textures.  Despite delay, patient was able to fully protect her airway. Attempted to challenge the patient with sequential sips but unable to perform due to cognitive and motor planning deficits. Motor planning also intermittently impacted patient's efficiency with the straw.  Recommend patient initiate a diet of Dys. 1 textures with thin liquids via cup. Patient will need full supervision, consume all meals OOB and with limited distractions. Patient educated on results but will need reinforcement.  SLP Visit Diagnosis Dysphagia, oropharyngeal phase  (R13.12) Attention and concentration deficit following -- Frontal lobe and executive function deficit following -- Impact on safety and function Mild aspiration risk;Moderate aspiration risk   No flowsheet data found.  Prognosis 01/23/2021 Prognosis for Safe Diet Advancement Good Barriers to Reach Goals Cognitive deficits Barriers/Prognosis Comment -- CHL IP DIET RECOMMENDATION 01/23/2021 SLP Diet Recommendations Dysphagia 1 (Puree) solids;Thin liquid Liquid Administration via Cup Medication Administration Crushed with puree Compensations Minimize environmental distractions;Slow rate;Small sips/bites Postural Changes (No Data)   CHL IP OTHER RECOMMENDATIONS 01/23/2021 Recommended Consults -- Oral Care Recommendations Oral care BID Other Recommendations --   CHL IP FOLLOW UP RECOMMENDATIONS 01/23/2021 Follow up Recommendations Outpatient SLP   CHL IP FREQUENCY AND DURATION 01/23/2021 Speech Therapy Frequency (ACUTE ONLY) min 3x week Treatment Duration 3 weeks      CHL IP ORAL PHASE 01/23/2021 Oral Phase Impaired Oral - Pudding Teaspoon -- Oral - Pudding Cup -- Oral - Honey Teaspoon -- Oral - Honey Cup -- Oral - Nectar Teaspoon Decreased bolus cohesion Oral - Nectar Cup Decreased bolus cohesion Oral - Nectar Straw Decreased bolus cohesion Oral - Thin Teaspoon Decreased bolus cohesion Oral - Thin Cup Decreased bolus cohesion;Piecemeal swallowing Oral - Thin Straw Decreased bolus cohesion;Piecemeal swallowing Oral - Puree Decreased bolus cohesion;Piecemeal swallowing;Weak lingual manipulation Oral - Mech Soft Impaired mastication;Weak lingual manipulation;Delayed oral transit;Decreased bolus cohesion Oral - Regular -- Oral - Multi-Consistency -- Oral - Pill -- Oral Phase - Comment --  CHL IP PHARYNGEAL PHASE 01/23/2021 Pharyngeal Phase Impaired Pharyngeal- Pudding Teaspoon -- Pharyngeal --  Pharyngeal- Pudding Cup -- Pharyngeal -- Pharyngeal- Honey Teaspoon -- Pharyngeal -- Pharyngeal- Honey Cup -- Pharyngeal -- Pharyngeal- Nectar  Teaspoon Delayed swallow initiation-pyriform sinuses Pharyngeal Material does not enter airway Pharyngeal- Nectar Cup Delayed swallow initiation-pyriform sinuses Pharyngeal Material does not enter airway Pharyngeal- Nectar Straw Delayed swallow initiation-pyriform sinuses Pharyngeal Material does not enter airway Pharyngeal- Thin Teaspoon Delayed swallow initiation-pyriform sinuses Pharyngeal Material does not enter airway Pharyngeal- Thin Cup Delayed swallow initiation-pyriform sinuses Pharyngeal Material does not enter airway Pharyngeal- Thin Straw Delayed swallow initiation-pyriform sinuses Pharyngeal Material does not enter airway Pharyngeal- Puree Delayed swallow initiation-vallecula Pharyngeal -- Pharyngeal- Mechanical Soft Delayed swallow initiation-vallecula Pharyngeal -- Pharyngeal- Regular -- Pharyngeal -- Pharyngeal- Multi-consistency -- Pharyngeal -- Pharyngeal- Pill -- Pharyngeal -- Pharyngeal Comment --  CHL IP CERVICAL ESOPHAGEAL PHASE 01/23/2021 Cervical Esophageal Phase WFL Pudding Teaspoon -- Pudding Cup -- Honey Teaspoon -- Honey Cup -- Nectar Teaspoon -- Nectar Cup -- Nectar Straw -- Thin Teaspoon -- Thin Cup -- Thin Straw -- Puree -- Mechanical Soft -- Regular -- Multi-consistency -- Pill -- Cervical Esophageal Comment -- PAYNE, COURTNEY 01/23/2021, 2:57 PM    Weston Anna, MA, CCC-SLP [phone]           No results for input(s): WBC, HGB, HCT, PLT in the last 72 hours. No results for input(s): NA, K, CL, CO2, GLUCOSE, BUN, CREATININE, CALCIUM in the last 72 hours.  Intake/Output Summary (Last 24 hours) at 01/24/2021 1048 Last data filed at 01/23/2021 1850 Gross per 24 hour  Intake 180 ml  Output -  Net 180 ml        Physical Exam: Vital Signs Blood pressure 136/86, pulse 91, temperature 98.2 F (36.8 C), resp. rate 19, height 5\' 4"  (1.626 m), weight 81.4 kg, SpO2 97 %.     Constitutional: No distress . Vital signs reviewed. HEENT: EOMI, oral membranes moist Neck: supple, trach  stoma closed Cardiovascular: RRR without murmur. No JVD    Respiratory/Chest: CTA Bilaterally without wheezes or rales. Normal effort    GI/Abdomen: BS +, non-tender, non-distended Ext: no clubbing, cyanosis, or edema Psych: flat, cooperative, engaging more though Musculoskeletal:     Comments: No edema or tenderness in extremities  Neurological:     alert. Follows basic commands. Moves all 4's. Still dysphonic. Fairly reasonable standing balance. Still impulsive. Improved attention  Assessment/Plan: 1. Functional deficits which require 3+ hours per day of interdisciplinary therapy in a comprehensive inpatient rehab setting.  Physiatrist is providing close team supervision and 24 hour management of active medical problems listed below.  Physiatrist and rehab team continue to assess barriers to discharge/monitor patient progress toward functional and medical goals  Care Tool:  Bathing        Body parts bathed by helper: Right arm,Left arm,Chest,Abdomen,Front perineal area,Buttocks,Right upper leg,Left upper leg,Right lower leg,Left lower leg,Face     Bathing assist Assist Level: Dependent - Patient 0%     Upper Body Dressing/Undressing Upper body dressing   What is the patient wearing?: Pull over shirt    Upper body assist Assist Level: Dependent - Patient 0%    Lower Body Dressing/Undressing Lower body dressing      What is the patient wearing?: Incontinence brief,Pants     Lower body assist Assist for lower body dressing: 2 Helpers     Toileting Toileting    Toileting assist Assist for toileting: 2 Helpers (2 helpers to turn pt in bed)     Transfers Chair/bed transfer  Transfers assist  Chair/bed transfer activity  did not occur: Safety/medical concerns  Chair/bed transfer assist level: Minimal Assistance - Patient > 75%     Locomotion Ambulation   Ambulation assist   Ambulation activity did not occur: Safety/medical concerns  Assist level: Minimal  Assistance - Patient > 75% Assistive device: Hand held assist Max distance: 150'   Walk 10 feet activity   Assist  Walk 10 feet activity did not occur: Safety/medical concerns  Assist level: Minimal Assistance - Patient > 75% Assistive device: Hand held assist   Walk 50 feet activity   Assist Walk 50 feet with 2 turns activity did not occur: Safety/medical concerns  Assist level: Minimal Assistance - Patient > 75% Assistive device: Hand held assist    Walk 150 feet activity   Assist Walk 150 feet activity did not occur: Safety/medical concerns  Assist level: Minimal Assistance - Patient > 75% Assistive device: Hand held assist    Walk 10 feet on uneven surface  activity   Assist Walk 10 feet on uneven surfaces activity did not occur: Safety/medical concerns         Wheelchair     Assist Will patient use wheelchair at discharge?: No (Per PT long-term goals)             Wheelchair 50 feet with 2 turns activity    Assist            Wheelchair 150 feet activity     Assist          Blood pressure 136/86, pulse 91, temperature 98.2 F (36.8 C), resp. rate 19, height 5\' 4"  (1.626 m), weight 81.4 kg, SpO2 97 %.  Medical Problem List and Plan: 1.  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  Antithrombotics: -DVT/anticoagulation: SCDs             -antiplatelet therapy: N/A 3. Pain Management:   -off oxycodone   3/4 tylenol alone for pain and doing well 4. Mood: Demonstrates no anxiety or agitation, has been alert during the day, d/c Klonopin.               -antipsychotic agents: Seroquel 25 mg nightly                         This medication is necessary. 5. Neuropsych: This patient is not capable of making decisions on her own behalf.  -Continue ritalin trial to help  attention and initation  continue ritalin  10mg  at 0700 and 1200--attention improving  6. Skin/Wound Care: Routine skin checks 7. Fluids/Electrolytes/Nutrition: TF changed to HS  -recheck labs Monday  8.  Seizure prophylaxis.  Keppra 1000 mg twice daily.  EEG negative 9.  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    - decannulated 3/2 without any problems. Stoma  closed 10.  Dysphagia with decreased nutritional storage.  Gastrostomy tube 12/13/2020 per Dr. Andrey Farmer.  .              -MBS 3/3--> D1/Thin liquids   3/4-change TF to nocturnal 11.  Aspiration pneumonia.  5-day course of Unasyn completed. 12.  Hypertension.  Well controlled, continue Norvasc 10 mg daily, Lopressor 25 mg twice daily.  Patient would follow-up cardiology services.             controlled 2/22  3/4- BP reasonably controlled 14.  Morbid obesity.  BMI 31.47.  Dietary follow-up 15.  Prediabetes.  Hemoglobin A1c 5.9.  CBGs monitored while on tube feeds             Moderate increased  2/27- BGs 127-179- monitor closely since elevated 16. Tachycardia:  3/4 better controlled with propranolol--continue current dose 17. Loose stool:  -improved off reglan---can be a little mushy at times 18. Low grade temp 3/3 x1. No further elevated readings  LOS: 14 days A FACE TO FACE EVALUATION WAS PERFORMED  Meredith Staggers 01/24/2021, 10:48 AM

## 2021-01-24 NOTE — Progress Notes (Addendum)
Nutrition Follow-up  DOCUMENTATION CODES:   Non-severe (moderate) malnutrition in context of acute illness/injury  INTERVENTION:   Transition to nocturnal tube feeds via G-tube: - Osmolite 1.5 @ 75 ml/hr x 12 hours from 1800 to 0600 (total of 900 ml) - Continue free water flushes of 50 ml q 6 hours   [place] tube feeding regimen provides 1350 kcal, 56 grams of protein, and 686 ml of H2O (meets 79% of kcal needs and 66% of protein needs).  Total free water with flushes: 886 ml  - Continue MVI with minerals daily  - ProSource Plus 30 ml po BID, each supplement provides 100 kcal and 15 grams of protein  - Ensure Enlive po BID, each supplement provides 350 kcal and 20 grams of protein  - Magic Cup BID with lunch and dinner meals, each supplement provides 290 kcal and 9 grams of protein  NUTRITION DIAGNOSIS:   Moderate Malnutrition related to acute illness (cardiac arrest with resulting anoxic brain injury and dysphagia) as evidenced by mild fat depletion,mild muscle depletion,percent weight loss (9.5% weight loss in less than 2 months).  Ongoing, being addressed via TF and oral nutrition supplements  GOAL:   Patient will meet greater than or equal to 90% of their needs  Progressing  MONITOR:   Diet advancement,Labs,Weight trends,TF tolerance,I & O's  REASON FOR ASSESSMENT:   Consult Enteral/tube feeding initiation and management,Assessment of nutrition requirement/status  ASSESSMENT:   55 year old female with PMH of HTN. Presented to [facility] on 12/02/20 with witnessed cardiac arrest. Pt required intubation for airway protection. Pt underwent tracheostomy on 12/13/20 as well as placement of G-tube. Pt remains NPO. Imaging studies consistent with anoxic brain injury. Admitted to CIR on 01/10/21.  3/02 - decannulated 3/03 - s/p MBS, diet advanced to dysphagia 1 with thin liquids  Discussed pt with MD. Will transition to nocturnal tube feeds to stimulate appetite during the  day. Discussed with RN. RD will order oral nutrition supplements to optimize kcal and protein intake via PO route.  Admit weight: 78.4 kg Current weight: 81.4 kg  Meal Completion: 15-25%  Medications reviewed and include: ProSource Plus 30 ml po BID, ritalin, MVI with minerals, klor-con 20 mEq daily, florastor  Labs reviewed. CBG's: 119-161 x 24 hours  Diet Order:   Diet Order            DIET - DYS 1 Room service appropriate? Yes; Fluid consistency: Thin  Diet effective now                 EDUCATION NEEDS:   No education needs have been identified at this time  Skin:  Skin Assessment: Skin Integrity Issues: Incisions: neck  Last BM:  01/23/21 large type 6  Height:   Ht Readings from Last 1 Encounters:  01/10/21 5\' 4"  (1.626 m)    Weight:   Wt Readings from Last 1 Encounters:  01/24/21 81.4 kg    BMI:  Body mass index is 30.8 kg/m.  Estimated Nutritional Needs:   Kcal:  1700-1900  Protein:  85-100 grams  Fluid:  1.7-1.9 L    Gustavus Bryant, MS, RD, LDN Inpatient Clinical Dietitian Please see AMiON for contact information.

## 2021-01-24 NOTE — Progress Notes (Signed)
Speech Language Pathology Daily Session Note  Patient Details  Name: LEAHANN LEMPKE MRN: [id number] Date of Birth: Dec 04, 1965  Today's Date: 01/24/2021 SLP Individual Time: 1100-1130 SLP Individual Time Calculation (min): 30 min  Short Term Goals: Week 3: SLP Short Term Goal 1 (Week 3): Patient will consume current diet with minimal overt s/s of aspiration and Min A verbal cues for use of swallowing compensatory strategies. SLP Short Term Goal 2 (Week 3): Patient will utilize an increased vocal intensity to achieve ~75% intelligibility at the phrase level with Mod A verbal and visual cues. SLP Short Term Goal 3 (Week 3): Patient will demonstrate sustained attention to functional tasks for 5 minutes with Mod verbal cues for redirection. SLP Short Term Goal 4 (Week 3): Patient will utilize external memory aids for orientaiton place and time with Mod A verbal and visual cues. SLP Short Term Goal 5 (Week 3): Patient will demonstrate functional problem solving for basic and familiar tasks with Mod A verbal and visual cues.  Skilled Therapeutic Interventions: Skilled treatment session focused on cognitive goals. SLP facilitated session by providing Max A tactile and visual cues for visual scanning to locate specific colored blocks and match it to the appropriate square. Patient identified the colors with 75% accuracy but required extra time to reach and pick up the block due to motor planning issues. Patient endorses visual deficits but had difficulty explaining them. Patient left upright in wheelchair at RN station with alarm on and all needs within reach. Continue with current plan of care.       Pain No/Denies Pain   Therapy/Group: Individual Therapy  PAYNE, COURTNEY 01/24/2021, 3:15 PM

## 2021-01-24 NOTE — Progress Notes (Signed)
Clarified with PA Dan about pt blood sugars being elevated and pt needing coverage. Per PA, no coverage needed at this time.  [name] Stack, LPN

## 2021-01-24 NOTE — Progress Notes (Signed)
Speech Language Pathology Weekly Progress and Session Note  Patient Details  Name: [name] [name] MRN: [id number] Date of Birth: December 04, 1965  Beginning of progress report period: January 17, 2021 End of progress report period: January 24, 2021  Today's Date: 01/24/2021 SLP Individual Time: 0730-0825 SLP Individual Time Calculation (min): 55 min  Short Term Goals: Week 2: SLP Short Term Goal 1 (Week 2): Patient will tolerate PMSV for 60 minutes with all vitals remaining WFL and without evidence of back pressure with supervision. SLP Short Term Goal 1 - Progress (Week 2): Met SLP Short Term Goal 2 (Week 2): Patient will vocalize at the word level in 50% of opportunities with Max A multimodal cues. SLP Short Term Goal 2 - Progress (Week 2): Met SLP Short Term Goal 3 (Week 2): Patient will follow 1-step commands with Max A multimodal cues in75% of opportunities. SLP Short Term Goal 3 - Progress (Week 2): Met SLP Short Term Goal 4 (Week 2): Patient will focus attention to basic tasks for 2 minutes with Mod A multimodal cues. SLP Short Term Goal 4 - Progress (Week 2): Met SLP Short Term Goal 5 (Week 2): Patient will consume trials of ice chips with minimal overt s/s of aspiration and Min verbal cues for AP transit and swallow initation over 2 sessions to assess readiness for MBS. SLP Short Term Goal 5 - Progress (Week 2): Met    New Short Term Goals: Week 3: SLP Short Term Goal 1 (Week 3): Patient will consume current diet with minimal overt s/s of aspiration and Min A verbal cues for use of swallowing compensatory strategies. SLP Short Term Goal 2 (Week 3): Patient will utilize an increased vocal intensity to achieve ~75% intelligibility at the phrase level with Mod A verbal and visual cues. SLP Short Term Goal 3 (Week 3): Patient will demonstrate sustained attention to functional tasks for 5 minutes with Mod verbal cues for redirection. SLP Short Term Goal 4 (Week 3): Patient will utilize  external memory aids for orientaiton place and time with Mod A verbal and visual cues. SLP Short Term Goal 5 (Week 3): Patient will demonstrate functional problem solving for basic and familiar tasks with Mod A verbal and visual cues.  Weekly Progress Updates: Patient continues to make excellent gains and has met 5 of 5 STGs this reporting period. Patient had an MBS this week and initiated a diet of Dys. 1 textures with thin liquids. Patient with minimal PO intake but also has had minimal overt s/s of aspiration with both solids and liquids. Recommend to continue current diet. Patient is now decannulated but continues to require Max verbal cues for use of an increased vocal intensity at the word and phrase level while expressing wants/needs. Due to this, patient is overall ~50% intelligible. Patient continues to demonstrate moderate-severe cognitive deficits and requires Max verbal cues for attention, problem solving and overall safety with functional and familiar tasks. Patient and family education ongoing. Patient would benefit from continued skilled SLP intervention to maximize her cognitive, speech and swallowing function prior to discharge.      Intensity: Minumum of 1-2 x/day, 30 to 90 minutes Frequency: 3 to 5 out of 7 days Duration/Length of Stay: 2 weeks Treatment/Interventions: Dysphagia/aspiration precaution training;Therapeutic Activities;Environmental controls;Cueing hierarchy;Functional tasks;Patient/family education;Speech/Language facilitation;Cognitive remediation/compensation;Internal/external aids   Daily Session  Skilled Therapeutic Interventions: Skilled treatment session focused on dysphagia and communication goals. Upon arrival, patient was asleep in bed but awakened easily. Patient sat EOB with extra time and supervision  level verbal cues but required Mod tactile and verbal cues to sit in wheelchair due to motor planning deficits. Patient consumed breakfast meal of Dys. 1  textures with thin liquids without overt s/s of aspiration but required overall Max A multimodal cues for self-feeding due to apraxia. Because of this, patient attempted to utilize bilateral upper extremities with all tasks and mostly utilized her spoon as cup and was trying to "drink" the pureed textures from the spoon.  Recommend patient continue current diet with full supervision. Patient with minimal verbal expression throughout session and required Mod verbal cues to maximize intelligibility to ~50% at the word and phrase level. Patient left upright in bed with alarm on and all needs within reach. Continue with current plan of care.       Pain No/Denies Pain   Therapy/Group: Individual Therapy  Julian Medina 01/24/2021, 6:25 AM

## 2021-01-25 LAB — GLUCOSE, CAPILLARY
Glucose-Capillary: 158 mg/dL — ABNORMAL HIGH (ref 70–99)
Glucose-Capillary: 124 mg/dL — ABNORMAL HIGH (ref 70–99)
Glucose-Capillary: 130 mg/dL — ABNORMAL HIGH (ref 70–99)
Glucose-Capillary: 120 mg/dL — ABNORMAL HIGH (ref 70–99)
Glucose-Capillary: 156 mg/dL — ABNORMAL HIGH (ref 70–99)

## 2021-01-26 LAB — GLUCOSE, CAPILLARY
Glucose-Capillary: 116 mg/dL — ABNORMAL HIGH (ref 70–99)
Glucose-Capillary: 129 mg/dL — ABNORMAL HIGH (ref 70–99)
Glucose-Capillary: 135 mg/dL — ABNORMAL HIGH (ref 70–99)
Glucose-Capillary: 143 mg/dL — ABNORMAL HIGH (ref 70–99)
Glucose-Capillary: 149 mg/dL — ABNORMAL HIGH (ref 70–99)
Glucose-Capillary: 170 mg/dL — ABNORMAL HIGH (ref 70–99)

## 2021-01-26 NOTE — Progress Notes (Signed)
Occupational Therapy Session Note  Patient Details  Name: [name] [name] MRN: [id number] Date of Birth: Oct 11, 1966  Today's Date: 01/26/2021 OT Individual Time: 3662-9476 OT Individual Time Calculation (min): 41 min    Skilled Therapeutic Interventions/Progress Updates:    Pt greeted in bed, just finished lunch with NT present. Started session by focusing on initiation and praxis in the context of self care. Pt had doffed her gripper socks in bed. OT demonstrated how to use her personal lotion to dispense lotion, pt then requiring Min A to dispense lotion before she initiated lotioning B UEs/LEs. Significantly increased time required for problem solving when donning new gripper socks after, pt repeating the same ineffective problem solving strategy multiple times. When OT opened the hole of the gripper sock and handed it back to her, pt initiated threading her toes through the hole. She needed this setup assist for both feet and overall Mod A due to repeatedly stopping task before sock was fully donned. She then ambulated to the sink with HHA. When cued to brush her teeth, pt very externally distracted by items in the environment, utilized ineffective problem solving strategies when trying to turn on the faucet and then would give up and reach for a different item. Ideational apraxia when using a wash cloth to brush her teeth. Pt ultimately needing OT to set up her toothbrush with toothpaste before she initiated completion of oral care. Hand over hand assist for properly managing faucet levers as she tended to squeeze the faucet ?whether she anticipated water to be controlled this way. Due to toothpaste residue on shirt after, pt changed shirts while sitting EOB. Pt once again needing assistance for self organization and problem solving during task, unable to independently orient her shirt or locate appropriate holes for head/arms. Decreased attention to the Rt UE noted. At end of session pt returned to  bed and was left with all needs within reach, bed alarm set, and waist belt secured.   Therapy Documentation Precautions:  Precautions Precautions: Fall,Other (comment) Precaution Comments: O2 via trach collar; PEG tube, delayed processing/initiation Restrictions Weight Bearing Restrictions: No RUE Weight Bearing: Non weight bearing Pain: no s/s pain during tx   ADL: ADL Eating: NPO Grooming: Dependent Where Assessed-Grooming: Edge of bed Upper Body Bathing: Dependent Where Assessed-Upper Body Bathing: Edge of bed Lower Body Bathing: Dependent Where Assessed-Lower Body Bathing: Bed level Upper Body Dressing: Dependent Where Assessed-Upper Body Dressing: Edge of bed Lower Body Dressing: Dependent Where Assessed-Lower Body Dressing: Bed level      Therapy/Group: Individual Therapy  Adaleigh Warf A Alicja Everitt 01/26/2021, 3:56 PM

## 2021-01-26 NOTE — Progress Notes (Signed)
PROGRESS NOTE   Subjective/Complaints:  Patient without complaints.  Ate approximately 50% of lunch with full supervision.  No coughing during meals according to CNA  ROS: Limited due to cognitive/behavioral     Objective:   No results found. No results for input(s): WBC, HGB, HCT, PLT in the last 72 hours. No results for input(s): NA, K, CL, CO2, GLUCOSE, BUN, CREATININE, CALCIUM in the last 72 hours.  Intake/Output Summary (Last 24 hours) at 01/26/2021 1246 Last data filed at 01/26/2021 0801 Gross per 24 hour  Intake 360 ml  Output --  Net 360 ml        Physical Exam: Vital Signs Blood pressure 120/84, pulse 79, temperature 97.6 F (36.4 C), temperature source Oral, resp. rate 18, height 5\' 4"  (1.626 m), weight 78.6 kg, SpO2 98 %.   General: No acute distress Mood and affect are appropriate Heart: Regular rate and rhythm no rubs murmurs or extra sounds Lungs: Clear to auscultation, breathing unlabored, no rales or wheezes Abdomen: Positive bowel sounds, soft nontender to palpation, nondistended Extremities: No clubbing, cyanosis, or edema Skin: No evidence of breakdown, no evidence of rash  Musculoskeletal:     Comments: No edema or tenderness in extremities  Neurological:     alert. Follows basic commands. Moves all 4's. Still dysphonic. Fairly reasonable standing balance. Still impulsive. Improved attention  Assessment/Plan: 1. Functional deficits which require 3+ hours per day of interdisciplinary therapy in a comprehensive inpatient rehab setting.  Physiatrist is providing close team supervision and 24 hour management of active medical problems listed below.  Physiatrist and rehab team continue to assess barriers to discharge/monitor patient progress toward functional and medical goals  Care Tool:  Bathing        Body parts bathed by helper: Right arm,Left arm,Chest,Abdomen,Front perineal  area,Buttocks,Right upper leg,Left upper leg,Right lower leg,Left lower leg,Face     Bathing assist Assist Level: Dependent - Patient 0%     Upper Body Dressing/Undressing Upper body dressing   What is the patient wearing?: Pull over shirt    Upper body assist Assist Level: Dependent - Patient 0%    Lower Body Dressing/Undressing Lower body dressing      What is the patient wearing?: Pants     Lower body assist Assist for lower body dressing: Maximal Assistance - Patient 25 - 49%     Toileting Toileting    Toileting assist Assist for toileting: 2 Helpers (2 helpers to turn pt in bed)     Transfers Chair/bed transfer  Transfers assist  Chair/bed transfer activity did not occur: Safety/medical concerns  Chair/bed transfer assist level: Minimal Assistance - Patient > 75%     Locomotion Ambulation   Ambulation assist   Ambulation activity did not occur: Safety/medical concerns  Assist level: Minimal Assistance - Patient > 75% Assistive device: Hand held assist Max distance: 150'   Walk 10 feet activity   Assist  Walk 10 feet activity did not occur: Safety/medical concerns  Assist level: Minimal Assistance - Patient > 75% Assistive device: Hand held assist   Walk 50 feet activity   Assist Walk 50 feet with 2 turns activity did not occur: Safety/medical concerns  Assist level: Minimal Assistance - Patient > 75% Assistive device: Hand held assist    Walk 150 feet activity   Assist Walk 150 feet activity did not occur: Safety/medical concerns  Assist level: Minimal Assistance - Patient > 75% Assistive device: Hand held assist    Walk 10 feet on uneven surface  activity   Assist Walk 10 feet on uneven surfaces activity did not occur: Safety/medical concerns         Wheelchair     Assist Will patient use wheelchair at discharge?: No (Per PT long-term goals)             Wheelchair 50 feet with 2 turns activity    Assist             Wheelchair 150 feet activity     Assist          Blood pressure 120/84, pulse 79, temperature 97.6 F (36.4 C), temperature source Oral, resp. rate 18, height 5\' 4"  (1.626 m), weight 78.6 kg, SpO2 98 %.  Medical Problem List and Plan: 1.  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  Antithrombotics: -DVT/anticoagulation: SCDs             -antiplatelet therapy: N/A 3. Pain Management:   -off oxycodone   3/4 tylenol alone for pain and doing well 4. Mood: Demonstrates no anxiety or agitation, has been alert during the day, d/c Klonopin.               -antipsychotic agents: Seroquel 25 mg nightly                         This medication is necessary. 5. Neuropsych: This patient is not capable of making decisions on her own behalf.  -Continue ritalin trial to help attention and initation  continue ritalin  10mg  at 0700 and 1200--attention improving  6. Skin/Wound Care: Routine skin checks 7. Fluids/Electrolytes/Nutrition: TF changed to HS  -recheck labs Monday  8.  Seizure prophylaxis.  Keppra 1000 mg twice daily.  EEG negative 9.  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    - decannulated 3/2 without any problems. Stoma  closed 10.  Dysphagia with decreased nutritional storage.  Gastrostomy tube 12/13/2020 per Dr. Andrey Farmer.  .              -MBS 3/3--> D1/Thin liquids   3/4-change TF to nocturnal 11.  Aspiration pneumonia.  5-day course of Unasyn completed. 12.  Hypertension.  Well controlled, continue Norvasc 10 mg daily, Lopressor 25 mg twice daily.  Patient would follow-up cardiology services.             controlled 2/22  3/4- BP reasonably controlled 14.  Morbid obesity.  BMI 31.47.  Dietary follow-up 15.  Prediabetes.  Hemoglobin A1c 5.9.  CBGs monitored while on tube feeds              Moderate increased  2/27- BGs 127-179- monitor closely since elevated 16. Tachycardia:  3/4 better controlled with propranolol--continue current dose 17. Loose stool:  -improved off reglan---can be a little mushy at times 18. Low grade temp 3/3 x1. No further elevated readings  LOS: 16 days A FACE TO [place] E Geroge Gilliam 01/26/2021, 12:46 PM

## 2021-01-26 NOTE — Progress Notes (Signed)
Physical Therapy Session Note  Patient Details  Name: [name] [name] MRN: [id number] Date of Birth: 1966-01-01  Today's Date: 01/26/2021 PT Individual Time: 1324-4010 PT Individual Time Calculation (min): 55 min   Short Term Goals: Week 2:  PT Short Term Goal 1 (Week 2): Pt will complete bed mobility with minA consistently. PT Short Term Goal 2 (Week 2): Pt will complete bed to chair transfer with minA consistently. PT Short Term Goal 3 (Week 2): Pt will ambulates 100' with minA and LRAD. PT Short Term Goal 4 (Week 2): Pt will perform Berg Balance assessment.  Skilled Therapeutic Interventions/Progress Updates:     Pt received supine in bed and agrees to therapy. No complaint of pain. Supine to sit with bed features and verbal cues for positioning at EOB. Pt performs sit to stand and stand step transfer to [facility] with minA to guide hips going from standing to sitting. Pt consistently has trouble motor planning "stand to sit", so much of session focused on block repetition of transfer. PT provides minA as pt frequently attempts to sit on chair or mat table at an angle with hips not square to edge and buttocks too close to edge of surface. Pt ambulates x100' with CGA and no AD but requires minA when going from standing to sitting. Pt completes NMR activity for standing balance and activity tolerance with cognitive overlay. Pt tasked with placing pushpins in pattern, working on attention to task and fine motor skills. Pt is able to grasp pins with PT cues on sequencing and grasp pattern, but is unable to accurately place pins on targets. Pt then attempts to place colored checkers pieces on corresponding colors on checker board. Pt able to correctly identify colors when asked, but cannot accurately pick up pieces or place them on correct squares.   Pt performs sit to supine with minA to guide trunk and hips. Pt then performs 4x10 supine bridges with PT providing multimodal cues for correct performance. On  last rep of each set pt holds bridge for count of 10. Pt follows commands consistently with this exercise. Performed for core strengthening as well as attention to task.  PT cues pt to sit up from mat table with legs on ground. Pt is able to go from supine to long sitting with supervision but has difficulty motor planning process of swinging legs off mat and placing on floor. PT gives pt her socks and cues to don, but pt becomes internally distracted by feet and toes and cannot complete task. PT assists to don socks. Stand step transfer to [facility] with minA. Pt left at RN station in [facility] with alarm intact.  Therapy Documentation Precautions:  Precautions Precautions: Fall,Other (comment) Precaution Comments: O2 via trach collar; PEG tube, delayed processing/initiation Restrictions Weight Bearing Restrictions: No RUE Weight Bearing: Non weight bearing    Therapy/Group: Individual Therapy  Breck Coons, PT, DPT 01/26/2021, 8:47 AM

## 2021-01-26 NOTE — Plan of Care (Signed)
  Problem: Consults Goal: RH BRAIN INJURY PATIENT EDUCATION Description: Description: See Patient Education module for eduction specifics Outcome: Progressing   Problem: RH BOWEL ELIMINATION Goal: RH STG MANAGE BOWEL WITH ASSISTANCE Description: STG Manage Bowel with Assistance. Mod Outcome: Progressing   Problem: RH BLADDER ELIMINATION Goal: RH STG MANAGE BLADDER WITH ASSISTANCE Description: STG Manage Bladder With mod Assistance Outcome: Progressing   Problem: RH SAFETY Goal: RH STG ADHERE TO SAFETY PRECAUTIONS W/ASSISTANCE/DEVICE Description: STG Adhere to Safety Precautions With Assistance/Device. Mod assist Outcome: Progressing   Problem: RH COGNITION-NURSING Goal: RH STG USES MEMORY AIDS/STRATEGIES W/ASSIST TO PROBLEM SOLVE Description: STG Uses Memory Aids/Strategies With Assistance to Problem Solve. Mod assist Outcome: Progressing

## 2021-01-26 NOTE — Progress Notes (Signed)
Occupational Therapy Session Note  Patient Details  Name: [name] [name] MRN: [id number] Date of Birth: 1966/08/20  Today's Date: 01/26/2021 OT Individual Time: 0932-3557 OT Individual Time Calculation (min): 26 min   Short Term Goals: Week 2:  OT Short Term Goal 1 (Week 2): Pt will complete 1 step of LB dressing task OT Short Term Goal 2 (Week 2): Pt will maintain standing balance at the sink with Min A in preparation for BADL tas  Skilled Therapeutic Interventions/Progress Updates:    Pt greeted in bed with no c/o pain, initiating supine<sit once OT unfastened her waist belt. Pt asking for "water." OT provided full supervision while she drank from her cup of water, no s/s aspiration. Tried to have her transfer to the w/c however pt perseverating on her cushion, tapping/touching it. Could not direct her to sit down. Also noted that pt exhibited very poor use of the RW for stand pivot. Therefore transitioned to ambulatory transfer to toilet given HHA. Pt required Mod A to sit squarely onto the toilet due to ?decreased body awareness in space, pt trying to sit mostly onto the Rt side of toilet. She was able to lower clothing with CGA and cuing before sitting down. OT ran the faucet water to promote continent void, however after a few minutes pt ultimately unable to go. After pull up was elevated, she initiated removal of her pants in standing and then ambulated to the sink with HHA. Visual and demonstrational cues to wash hands as well as assistance to turn on faucet lever to initiate hand washing. Decreased attention to the Rt hand to properly rinse off soap. CGA for standing at sink today. She then returned to EOB and engaged in donning new pants. Pt required Max A as she kept trying to don pants in standing vs sitting. She remained in bed at close of session, all needs within reach, waist belt fastened, and bed alarm set. Tx focus placed on functional cognition, ADL retraining, and dynamic standing  balance.    Therapy Documentation Precautions:  Precautions Precautions: Fall,Other (comment) Precaution Comments: O2 via trach collar; PEG tube, delayed processing/initiation Restrictions Weight Bearing Restrictions: No RUE Weight Bearing: Non weight bearing ADL: ADL Eating: NPO Grooming: Dependent Where Assessed-Grooming: Edge of bed Upper Body Bathing: Dependent Where Assessed-Upper Body Bathing: Edge of bed Lower Body Bathing: Dependent Where Assessed-Lower Body Bathing: Bed level Upper Body Dressing: Dependent Where Assessed-Upper Body Dressing: Edge of bed Lower Body Dressing: Dependent Where Assessed-Lower Body Dressing: Bed level     Therapy/Group: Individual Therapy  Banyan Goodchild A Deloyce Walthers 01/26/2021, 12:25 PM

## 2021-01-26 NOTE — Plan of Care (Signed)
  Problem: Consults Goal: RH BRAIN INJURY PATIENT EDUCATION Description: Description: See Patient Education module for eduction specifics 01/26/2021 1437 by Renda Rolls L, LPN Outcome: Progressing 01/26/2021 1435 by Renda Rolls L, LPN Outcome: Progressing   Problem: RH BOWEL ELIMINATION Goal: RH STG MANAGE BOWEL WITH ASSISTANCE Description: STG Manage Bowel with Assistance. Mod 01/26/2021 1437 by Renda Rolls L, LPN Outcome: Progressing 01/26/2021 1435 by Renda Rolls L, LPN Outcome: Progressing   Problem: RH BLADDER ELIMINATION Goal: RH STG MANAGE BLADDER WITH ASSISTANCE Description: STG Manage Bladder With mod Assistance 01/26/2021 1437 by Renda Rolls L, LPN Outcome: Progressing 01/26/2021 1435 by Renda Rolls L, LPN Outcome: Progressing   Problem: RH SAFETY Goal: RH STG ADHERE TO SAFETY PRECAUTIONS W/ASSISTANCE/DEVICE Description: STG Adhere to Safety Precautions With Assistance/Device. Mod assist 01/26/2021 1437 by Renda Rolls L, LPN Outcome: Progressing 01/26/2021 1435 by Renda Rolls L, LPN Outcome: Progressing   Problem: RH COGNITION-NURSING Goal: RH STG USES MEMORY AIDS/STRATEGIES W/ASSIST TO PROBLEM SOLVE Description: STG Uses Memory Aids/Strategies With Assistance to Problem Solve. Mod assist 01/26/2021 1437 by Renda Rolls L, LPN Outcome: Progressing 01/26/2021 1435 by Sanda Linger, LPN Outcome: Progressing

## 2021-01-27 LAB — GLUCOSE, CAPILLARY
Glucose-Capillary: 109 mg/dL — ABNORMAL HIGH (ref 70–99)
Glucose-Capillary: 152 mg/dL — ABNORMAL HIGH (ref 70–99)
Glucose-Capillary: 160 mg/dL — ABNORMAL HIGH (ref 70–99)
Glucose-Capillary: 162 mg/dL — ABNORMAL HIGH (ref 70–99)
Glucose-Capillary: 164 mg/dL — ABNORMAL HIGH (ref 70–99)
Glucose-Capillary: 99 mg/dL (ref 70–99)

## 2021-01-27 LAB — CBC
HCT: 32.6 % — ABNORMAL LOW (ref 36.0–46.0)
Hemoglobin: 10.4 g/dL — ABNORMAL LOW (ref 12.0–15.0)
MCH: 28.3 pg (ref 26.0–34.0)
MCHC: 31.9 g/dL (ref 30.0–36.0)
MCV: 88.6 fL (ref 80.0–100.0)
Platelets: 189 10*3/uL (ref 150–400)
RBC: 3.68 MIL/uL — ABNORMAL LOW (ref 3.87–5.11)
RDW: 21.2 % — ABNORMAL HIGH (ref 11.5–15.5)
WBC: 5.9 10*3/uL (ref 4.0–10.5)
nRBC: 0 % (ref 0.0–0.2)

## 2021-01-27 LAB — BASIC METABOLIC PANEL
Anion gap: 11 (ref 5–15)
BUN: 10 mg/dL (ref 6–20)
CO2: 24 mmol/L (ref 22–32)
Calcium: 8.7 mg/dL — ABNORMAL LOW (ref 8.9–10.3)
Chloride: 104 mmol/L (ref 98–111)
Creatinine, Ser: 0.48 mg/dL (ref 0.44–1.00)
GFR, Estimated: >60 mL/min (ref >60)
Glucose, Bld: 157 mg/dL — ABNORMAL HIGH (ref 70–99)
Potassium: 3.6 mmol/L (ref 3.5–5.1)
Sodium: 139 mmol/L (ref 135–145)

## 2021-01-27 MED ORDER — PROPRANOLOL HCL 10 MG PO TABS
10.0000 mg | ORAL_TABLET | Freq: Two times a day (BID) | ORAL | Status: DC
  Administered 2021-01-27 – 2021-02-04 (×16): 10 mg
  Filled 2021-01-27 (×16): qty 1

## 2021-01-27 NOTE — Plan of Care (Signed)
  Problem: Consults Goal: RH BRAIN INJURY PATIENT EDUCATION Description: Description: See Patient Education module for eduction specifics Outcome: Progressing   Problem: RH BOWEL ELIMINATION Goal: RH STG MANAGE BOWEL WITH ASSISTANCE Description: STG Manage Bowel with Assistance. Mod Outcome: Progressing   Problem: RH BLADDER ELIMINATION Goal: RH STG MANAGE BLADDER WITH ASSISTANCE Description: STG Manage Bladder With mod Assistance Outcome: Progressing   Problem: RH SAFETY Goal: RH STG ADHERE TO SAFETY PRECAUTIONS W/ASSISTANCE/DEVICE Description: STG Adhere to Safety Precautions With Assistance/Device. Mod assist Outcome: Progressing   Problem: RH COGNITION-NURSING Goal: RH STG USES MEMORY AIDS/STRATEGIES W/ASSIST TO PROBLEM SOLVE Description: STG Uses Memory Aids/Strategies With Assistance to Problem Solve. Mod assist Outcome: Progressing

## 2021-01-27 NOTE — Progress Notes (Signed)
Speech Language Pathology Daily Session Notes  Patient Details  Name: [name] [name] MRN: [id number] Date of Birth: November 04, 1966  Today's Date: 01/27/2021  Session 1: SLP Individual Time: 0715-0800 SLP Individual Time Calculation (min): 45 min   Session 2: SLP Individual Time: 7673-4193 SLP Individual Time Calculation (min): 45 min  Short Term Goals: Week 3: SLP Short Term Goal 1 (Week 3): Patient will consume current diet with minimal overt s/s of aspiration and Min A verbal cues for use of swallowing compensatory strategies. SLP Short Term Goal 2 (Week 3): Patient will utilize an increased vocal intensity to achieve ~75% intelligibility at the phrase level with Mod A verbal and visual cues. SLP Short Term Goal 3 (Week 3): Patient will demonstrate sustained attention to functional tasks for 5 minutes with Mod verbal cues for redirection. SLP Short Term Goal 4 (Week 3): Patient will utilize external memory aids for orientaiton place and time with Mod A verbal and visual cues. SLP Short Term Goal 5 (Week 3): Patient will demonstrate functional problem solving for basic and familiar tasks with Mod A verbal and visual cues.  Skilled Therapeutic Interventions:  Session 1: Skilled treatment session focused on cognitive and dysphagia goals. SLP facilitated session by providing extra time and overall Mod A verbal and tactile cues for problem solving during transfer to the commode due to motor planning deficits. Patient was continent of bladder and required extra time and Min verbal cues for peri care. SLP provided tray set-up with breakfast meal of Dys. 1 textures with thin liquids. Patient demonstrated increased ability to self-feed efficiently with her RUE and overall Mod verbal and tactile cues. Patient also demonstrated improved PO intake. Patient consumed meal without overt s/s of aspiration but required Mod verbal and tactile cues for use of small bites/sips via straw. Recommend patient continue  current diet. Patient declined going back to bed, therefore, patient left upright in wheelchair with alarm on at RN station. Continue with current plan of care.   Session 2: Skilled treatment session focused on speech goals. SLP facilitated session by introducing RMT exercises to work on vocal intensity. SLP provided Max verbal, visual and tactile cues for patient to perform 25 repetitions of EMST exercises at 5 cm H2O and 25 repetitions of IMST exercises at 9 cm H2O. Increased cueing was needed due t motor planning deficits. Patient's aunt present and patient participated in a functional conversation about food preferences (patient was a vegan prior to hospitalization) with Mod verbal and visual cues needed for use of an increased vocal intensity to achieve ~80% intelligibility at the phrase level. Patient left upright in bed with alarm on and all needs within reach. Continue with current plan of care.   Pain No/Denies Pain   Therapy/Group: Individual Therapy  Jilda Kress 01/27/2021, 2:41 PM

## 2021-01-27 NOTE — Progress Notes (Signed)
[facility] Hospital Liaison note:  This patient has been referred to our outpatient based palliative care services. [facility] will continue to follow for disposition.  Please call for any outpatient palliative care related questions or concerns.  Thank you,  Lorelee Market, LPN [facility] Liaison [phone]

## 2021-01-27 NOTE — Progress Notes (Signed)
PROGRESS NOTE   Subjective/Complaints: No complaints this morning Her speech has improved immensely! Hgb 10.4, down from 11.6 BMP stable  ROS: Limited due to cognitive/behavioral     Objective:   No results found. Recent Labs    01/27/21 0450  WBC 5.9  HGB 10.4*  HCT 32.6*  PLT 189   Recent Labs    01/27/21 0450  NA 139  K 3.6  CL 104  CO2 24  GLUCOSE 157*  BUN 10  CREATININE 0.48  CALCIUM 8.7*   No intake or output data in the 24 hours ending 01/27/21 1422      Physical Exam: Vital Signs Blood pressure 131/65, pulse (!) 102, temperature 97.6 F (36.4 C), temperature source Oral, resp. rate 20, height 5\' 4"  (1.626 m), weight 80.7 kg, SpO2 99 %.   Gen: no distress, normal appearing HEENT: oral mucosa pink and moist, NCAT Cardio: tachycardic Chest: normal effort, normal rate of breathing Abd: soft, non-distended Ext: no edema Psych: pleasant, normal affect Skin: intact  Musculoskeletal:     Comments: No edema or tenderness in extremities  Neurological:     alert. Follows basic commands. Moves all 4's. Still dysphonic. Fairly reasonable standing balance. Still impulsive. Improved attention  Assessment/Plan: 1. Functional deficits which require 3+ hours per day of interdisciplinary therapy in a comprehensive inpatient rehab setting.  Physiatrist is providing close team supervision and 24 hour management of active medical problems listed below.  Physiatrist and rehab team continue to assess barriers to discharge/monitor patient progress toward functional and medical goals  Care Tool:  Bathing        Body parts bathed by helper: Right arm,Left arm,Chest,Abdomen,Front perineal area,Buttocks,Right upper leg,Left upper leg,Right lower leg,Left lower leg,Face     Bathing assist Assist Level: Dependent - Patient 0%     Upper Body Dressing/Undressing Upper body dressing   What is the patient  wearing?: Pull over shirt    Upper body assist Assist Level: Maximal Assistance - Patient 25 - 49%    Lower Body Dressing/Undressing Lower body dressing      What is the patient wearing?: Pants     Lower body assist Assist for lower body dressing: Maximal Assistance - Patient 25 - 49%     Toileting Toileting    Toileting assist Assist for toileting: 2 Helpers (2 helpers to turn pt in bed)     Transfers Chair/bed transfer  Transfers assist  Chair/bed transfer activity did not occur: Safety/medical concerns  Chair/bed transfer assist level: Minimal Assistance - Patient > 75%     Locomotion Ambulation   Ambulation assist   Ambulation activity did not occur: Safety/medical concerns  Assist level: Contact Guard/Touching assist Assistive device: No Device Max distance: 100'   Walk 10 feet activity   Assist  Walk 10 feet activity did not occur: Safety/medical concerns  Assist level: Contact Guard/Touching assist Assistive device: No Device   Walk 50 feet activity   Assist Walk 50 feet with 2 turns activity did not occur: Safety/medical concerns  Assist level: Contact Guard/Touching assist Assistive device: No Device    Walk 150 feet activity   Assist Walk 150 feet activity did not occur:  Safety/medical concerns  Assist level: Minimal Assistance - Patient > 75% Assistive device: Hand held assist    Walk 10 feet on uneven surface  activity   Assist Walk 10 feet on uneven surfaces activity did not occur: Safety/medical concerns         Wheelchair     Assist Will patient use wheelchair at discharge?: No (Per PT long-term goals)             Wheelchair 50 feet with 2 turns activity    Assist            Wheelchair 150 feet activity     Assist          Blood pressure 131/65, pulse (!) 102, temperature 97.6 F (36.4 C), temperature source Oral, resp. rate 20, height 5\' 4"  (1.626 m), weight 80.7 kg, SpO2 99  %.  Medical Problem List and Plan: 1.  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  Antithrombotics: -DVT/anticoagulation: SCDs             -antiplatelet therapy: N/A 3. Pain Management:   -off oxycodone   3/7 tylenol alone for pain and doing well- last received tylenol 2/27 4. Mood: Demonstrates no anxiety or agitation, has been alert during the day, d/c Klonopin.               -antipsychotic agents: Seroquel 25 mg nightly                         This medication is necessary. 5. Neuropsych: This patient is not capable of making decisions on her own behalf.  -Continue ritalin trial to help attention and initation  Continue ritalin  10mg  at 0700 and 1200--attention improving  6. Skin/Wound Care: Routine skin checks 7. Fluids/Electrolytes/Nutrition: TF changed to HS  -recheck labs Monday  8.  Seizure prophylaxis.  Keppra 1000 mg twice daily.  EEG negative 9.  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    - decannulated 3/2 without any problems. Stoma  closed 10.  Dysphagia with decreased nutritional storage.  Gastrostomy tube 12/13/2020 per Dr. Andrey Farmer.  .              -MBS 3/3--> D1/Thin liquids   3/4-change TF to nocturnal 11.  Aspiration pneumonia.  5-day course of Unasyn completed. 12.  Hypertension.  Well controlled, continue Norvasc 10 mg daily, Lopressor 25 mg twice daily.  Patient would follow-up cardiology services.             controlled 2/22  3/4- BP reasonably controlled 14.  Morbid obesity.  BMI 31.47.  Dietary follow-up 15.  Prediabetes.  Hemoglobin A1c 5.9.  CBGs monitored while on tube feeds             Moderate increased  2/27- BGs 127-179- monitor closely since elevated 16. Tachycardia:  3/7 better controlled with propranolol, but still elevated, increase propanolol to BID 17. Loose  stool:  -improved off reglan---can be a little mushy at times 18. Low grade temp 3/3 x1. No further elevated readings 19. Anemia: Hgb reviewed, down to 10.4 from 11.6 on 3/7, monitor weekly.   LOS: 17 days A FACE TO FACE  EVALUATION WAS PERFORMED  Clide Deutscher Raulkar 01/27/2021, 2:22 PM

## 2021-01-27 NOTE — Progress Notes (Signed)
Occupational Therapy Session Note  Patient Details  Name: [name] [name] MRN: [id number] Date of Birth: 03-02-1966  Today's Date: 01/27/2021 OT Individual Time: 7793-9688 OT Individual Time Calculation (min): 30 min   Short Term Goals: Week 2:  OT Short Term Goal 1 (Week 2): Pt will complete 1 step of LB dressing task OT Short Term Goal 2 (Week 2): Pt will maintain standing balance at the sink with Min A in preparation for BADL tas  Skilled Therapeutic Interventions/Progress Updates:    Pt greeted semi-reclined in bed with her Aunt present and agreeable to OT treatment session. Pt eager to get OOB and required cues to stop and wait for OT to set-up wc. Pt completed stand-pivot to wc with CGA. Worked on self-feeding goals using R hand. Pt was better able to maintain grasp on spoon today without built up handle. Focus on bite size and locating food items at midline and to the R. Pt needed cues to use L hand as a stabilizer, but not let it interfere with bringing food bolus to mouth. Pt reported need to go to the bathroom an ambulated with CGA to bathroom. Pt with successful BM and min A for peri-care. Pt ambulated back to bed at end of session in similar fashion and left semi-reclined in bed with bed alarm on, call bell in reach, and needs met.   Therapy Documentation Precautions:  Precautions Precautions: Fall,Other (comment) Precaution Comments: O2 via trach collar; PEG tube, delayed processing/initiation Restrictions Weight Bearing Restrictions: No RUE Weight Bearing: Non weight bearing Pain:  denies pain  Therapy/Group: Individual Therapy  Valma Cava 01/27/2021, 12:53 PM

## 2021-01-27 NOTE — Progress Notes (Signed)
Physical Therapy Weekly Progress Note  Patient Details  Name: [name] [name] MRN: [id number] Date of Birth: 10-30-1966  Beginning of progress report period: January 20, 2021 End of progress report period: January 27, 2021  Today's Date: 01/27/2021 PT Individual Time: 4888-9169 PT Individual Time Calculation (min): 56 min   Patient has met 3 of 4 short term goals.  Pt is progressing very well toward mobility goals, improving independence in all aspects of functional mobility. Most notable improvement is with ambulation, with pt able to consistently ambulate >100' with minA to [place]. Pt also performing supine to sit consistently with supervision, but still requires mina to [place] for sit to supine due to motor planning deficits. Motor planning deficits also impairing pt's ability to perform stand to sit safely and consistently, and cognition and balance continue to be impaired.  Patient continues to demonstrate the following deficits muscle weakness, decreased cardiorespiratoy endurance, decreased visual perceptual skills and decreased visual motor skills, decreased motor planning, decreased attention, decreased awareness, decreased problem solving and decreased safety awareness and decreased standing balance and decreased balance strategies and therefore will continue to benefit from skilled PT intervention to increase functional independence with mobility.  Patient progressing toward long term goals..  Continue plan of care.  PT Short Term Goals Week 2:  PT Short Term Goal 1 (Week 2): Pt will complete bed mobility with minA consistently. PT Short Term Goal 1 - Progress (Week 2): Progressing toward goal PT Short Term Goal 2 (Week 2): Pt will complete bed to chair transfer with minA consistently. PT Short Term Goal 2 - Progress (Week 2): Met PT Short Term Goal 3 (Week 2): Pt will ambulates 100' with minA and LRAD. PT Short Term Goal 3 - Progress (Week 2): Met PT Short Term Goal 4 (Week 2): Pt will  perform Berg Balance assessment. PT Short Term Goal 4 - Progress (Week 2): Met Week 3:  PT Short Term Goal 1 (Week 3): Pt will perform bed mobility consistently with minA. PT Short Term Goal 2 (Week 3): Pt will perform bed to chair transfer consistently with CGA. PT Short Term Goal 3 (Week 3): Pt will ambulate 300' with CGA and LRAD. PT Short Term Goal 4 (Week 3): Pt will improve Berg score by MCID.  Skilled Therapeutic Interventions/Progress Updates:  Ambulation/gait training;Community reintegration;DME/adaptive equipment instruction;Neuromuscular re-education;Psychosocial support;Stair training;UE/LE Strength taining/ROM;Wheelchair propulsion/positioning;Balance/vestibular training;Discharge planning;Functional electrical stimulation;Pain management;Skin care/wound management;Therapeutic Activities;UE/LE Coordination activities;Cognitive remediation/compensation;Patient/family education;Disease management/prevention;Functional mobility training;Splinting/orthotics;Therapeutic Exercise;Visual/perceptual remediation/compensation   Pt received supine in bed and agrees to therapy. No report of pain. Supine to sit with verbal cues for sequencing and positioning for safety. Pt performs sit to stand and steps to chair with CGA, but requires minA for sit to stand due to difficulty motor planing. WC transport to gym for time management. Pt performs block training of stand step transfer during session, working on motor planning for increased safety. Pt able to transfer from [facility] to mat with slow supervision and verbal cues, but requires consistent minA to transfer from mat to [facility], consistently misjudging distance and angle and sitting prior to being safely in front of chair. PT provides pt with external cues to facilitate sequencing, including targets for pt to step each foot on in order to position self for safe transfer. Pt ambulates 3x100' and then cues to sit with feet touching targets. Pt consistently requires  minA and max cueing to complete stand to sit transfer. Pt then performs NMR for balance and core strength, positioned in  quadruped on high low mat. Pt reaches for cones with alternating upper extremities, then performs low kneeling->high kneeling transitions x20 reps with PT demo of each activity. Pt then performs repeated "fast" reps of sit to stand with arms across chest to challenge balance and increasing strengthening aspect of transfer. Pt completes x30 prior to requiring rest break. Pt performs stand step transfer back to bed with CGA. Sit to supine with minA. Left with alarm intact and all needs within reach.  Therapy Documentation Precautions:  Precautions Precautions: Fall,Other (comment) Precaution Comments: O2 via trach collar; PEG tube, delayed processing/initiation Restrictions Weight Bearing Restrictions: No RUE Weight Bearing: Non weight bearing   Therapy/Group: Individual Therapy  Breck Coons, PT, DPT 01/27/2021, 3:36 PM

## 2021-01-28 LAB — GLUCOSE, CAPILLARY
Glucose-Capillary: 107 mg/dL — ABNORMAL HIGH (ref 70–99)
Glucose-Capillary: 112 mg/dL — ABNORMAL HIGH (ref 70–99)
Glucose-Capillary: 121 mg/dL — ABNORMAL HIGH (ref 70–99)
Glucose-Capillary: 159 mg/dL — ABNORMAL HIGH (ref 70–99)
Glucose-Capillary: 166 mg/dL — ABNORMAL HIGH (ref 70–99)
Glucose-Capillary: 166 mg/dL — ABNORMAL HIGH (ref 70–99)
Glucose-Capillary: 202 mg/dL — ABNORMAL HIGH (ref 70–99)

## 2021-01-28 MED ORDER — OSMOLITE 1.5 CAL PO LIQD
840.0000 mL | ORAL | Status: DC
  Administered 2021-01-28: 840 mL

## 2021-01-28 NOTE — Progress Notes (Signed)
Occupational Therapy Weekly Progress Note  Patient Details  Name: [name] [name] MRN: [id number] Date of Birth: 1966-01-29  Beginning of progress report period: January 11, 2021 End of progress report period: January 28, 2021  Today's Date: 01/28/2021 OT Individual Time: 1350-1447 OT Individual Time Calculation (min): 57 min    Patient has met 3 of 3 short term goals.  Patient is making excellent progress towards OT goals. Pt can functionally ambulate within self-care tasks with min/CG overall. 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 Pt is communicating much more efficiently and is working hard to regain her PLOF.  Patient continues to demonstrate the following deficits: muscle weakness, decreased cardiorespiratoy endurance, impaired timing and sequencing, motor apraxia, ataxia, decreased coordination and decreased motor planning, decreased visual perceptual skills, decreased midline orientation, decreased attention to right, right side neglect, decreased motor planning and ideational apraxia, decreased initiation, decreased attention, decreased awareness, decreased problem solving, decreased safety awareness, decreased memory and delayed processing and decreased sitting balance, decreased standing balance, decreased postural control, hemiplegia and decreased balance strategies and therefore will continue to benefit from skilled OT intervention to enhance overall performance with BADL and Reduce care partner burden.  Patient progressing toward long term goals..  Continue plan of care.  OT Short Term Goals Week 2:  OT Short Term Goal 1 (Week 2): Pt will complete 1 step of LB dressing task OT Short Term Goal 1 - Progress (Week 2): Met OT Short Term Goal 2 (Week 2): Pt will maintain standing balance at the sink with Min A in preparation for BADL tas OT Short Term Goal 2 - Progress (Week 2): Met Week 3:  OT Short  Term Goal 1 (Week 3): Patient will complete 1 step of toileting task OT Short Term Goal 2 (Week 3): Pt will locate 3 items on R side of sink with min verbal cues OT Short Term Goal 3 (Week 3): Pt will tolerate standing for 5 minutes in preparation for BADL task  Skilled Therapeutic Interventions/Progress Updates:    Pt greeted semi-reclined in bed and agreeable to OT treatment session focused on self-care retraining. Pt completed bed mobility with supervision. PT stated she needed to go to the bathroom and ambulated into BR with CGA. Pt needed verbal and tactile cues to then motor plan sitting on commode. Pt unable to void, but initiated doffing pants while seated on commode. Bathing completed from bench in shower with verbal cues for thoroughness of all body parts and CGA. Dressing continues to be a difficult task for patient 2/2 apraxia. OT set-up and oriented clothing, and also tried backwards chaining techniques, but pt continued to have difficulty. When donning pants pt unaware that she had R side in and began attempting to stand to pull up pants before both legs in. Worked on standing endurance for standing toothbrushing task. Pt able to locate toothbrush at midline, but needed max verbal cues for sequencing and set-up A. Pt returned to bed at end of session and left semi-reclined with alarm bed belt on, bed alarm on, call bell in reach, and needs met.   Therapy Documentation Precautions:  Precautions Precautions: Fall,Other (comment) Precaution Comments: O2 via trach collar; PEG tube, delayed processing/initiation Restrictions Weight Bearing Restrictions: No RUE Weight Bearing: Non weight bearing Pain:   denies pain  Therapy/Group: Individual Therapy  Valma Cava 01/28/2021, 2:33 PM

## 2021-01-28 NOTE — Progress Notes (Signed)
Patient ID: [name] [name], female   DOB: 16-May-1966, 55 y.o.   MRN: [id number]  SW met with pt husband in room to provide brief updates from team conference, with no changes in d/c date. Husband was preparing to assist pt and nurse with patient care.   SW will continue to follow pt and assess for d/c needs.    Loralee Pacas, MSW, [place] Office: [phone] Cell: [phone] Fax: [phone]

## 2021-01-28 NOTE — Progress Notes (Signed)
Speech Language Pathology Daily Session Note  Patient Details  Name: [name] [name] MRN: [id number] Date of Birth: [birth year]  Today's Date: 01/28/2021 SLP Individual Time: 0700-0800 SLP Individual Time Calculation (min): 60 min  Short Term Goals: Week 3: SLP Short Term Goal 1 (Week 3): Patient will consume current diet with minimal overt s/s of aspiration and Min A verbal cues for use of swallowing compensatory strategies. SLP Short Term Goal 2 (Week 3): Patient will utilize an increased vocal intensity to achieve ~75% intelligibility at the phrase level with Mod A verbal and visual cues. SLP Short Term Goal 3 (Week 3): Patient will demonstrate sustained attention to functional tasks for 5 minutes with Mod verbal cues for redirection. SLP Short Term Goal 4 (Week 3): Patient will utilize external memory aids for orientaiton place and time with Mod A verbal and visual cues. SLP Short Term Goal 5 (Week 3): Patient will demonstrate functional problem solving for basic and familiar tasks with Mod A verbal and visual cues.  Skilled Therapeutic Interventions: Skilled treatment session focused on dysphagia and speech goals. SLP facilitated session by providing Min A verbal cues for patient to transfer to the wheelchair to maximize safety with PO intake. Patient consumed his breakfast meal of Dys. 1 textures with thin liquids with minimal overt s/s of aspiration and required Min-Mod A verbal and tactile cues for use of small bites/sips with meal. Patient also demonstrated improved ability to self-feed. Recommend patient continue current diet. Patient continues to require Max A verbal and visual cues for orientation to place and date. However, patient demonstrates improved speech intelligibility and requires Mod verbal cues for use of an increased vocal intensity at the phrase level to achieve ~80% intelligibility. Patient performed basic self-care tasks with Min verbal cues for problem solving. Patient handed  off to RN. Continue with current plan of care.      Pain Pain Assessment Pain Scale: Faces Faces Pain Scale: No hurt  Therapy/Group: Individual Therapy  [name] [name] 01/28/2021, 10:08 AM

## 2021-01-28 NOTE — Progress Notes (Signed)
Paitent is alert and cooperative, in good spirits, spouse and daughter at bedside, patient denies pain, spouse states his wife is improving well, Osmolite 1.5.mg feeding hung @ 70 cc/he via Peg tube with flush as ordered, Patient ambulating to BR with assistance, tolerated well.

## 2021-01-28 NOTE — Progress Notes (Signed)
Physical Therapy Session Note  Patient Details  Name: [name] [name] MRN: [id number] Date of Birth: [birth year]  Today's Date: 01/28/2021 PT Individual Time: 4944-9675 PT Individual Time Calculation (min): 56 min   Short Term Goals: Week 3:  PT Short Term Goal 1 (Week 3): Pt will perform bed mobility consistently with minA. PT Short Term Goal 2 (Week 3): Pt will perform bed to chair transfer consistently with CGA. PT Short Term Goal 3 (Week 3): Pt will ambulate 300' with CGA and LRAD. PT Short Term Goal 4 (Week 3): Pt will improve Berg score by MCID.  Skilled Therapeutic Interventions/Progress Updates:     Pt received seated in [facility] and agrees to therapy. No complaint of pain. WC transport to gym for time management. Session with focus on NMR for standing balance, activity tolerance, and motor planning. Pt in parallel bars. Performs multiple reps of sit to stand with supervision for safety, but continues to require CGA and extra cueing for safe stand to sit, due to motor planning deficits (and visual impairments?). PT utilizes music to provide pt with auditory cues for rhythmic movement, including marching in place, side steps, weight shifting in all directions, raising arms overhead with 2lb bar, and performing minisquats. PT cues pt to release arms from parallel bars to increase strengthening and balance challenge. Pt performs all mobility with supervision to CGA and does not have any LOBs, but consistently attempts to sit in [place] at an angle before her legs are flush with edge of chair. PT provides education and external cues to improve transitional movement. WC transport to RN station. Pt seated in WC with alarm intact.  Therapy Documentation Precautions:  Precautions Precautions: Fall,Other (comment) Precaution Comments: O2 via trach collar; PEG tube, delayed processing/initiation Restrictions Weight Bearing Restrictions: No RUE Weight Bearing: Non weight bearing   Therapy/Group:  Individual Therapy  Breck Coons, PT, DPT 01/28/2021, 9:02 AM

## 2021-01-28 NOTE — Progress Notes (Signed)
PROGRESS NOTE   Subjective/Complaints: No complaints this morning Denies pain Making great progress! Team conference was held today  ROS: Limited due to cognitive/behavioral     Objective:   No results found. Recent Labs    01/27/21 0450  WBC 5.9  HGB 10.4*  HCT 32.6*  PLT 189   Recent Labs    01/27/21 0450  NA 139  K 3.6  CL 104  CO2 24  GLUCOSE 157*  BUN 10  CREATININE 0.48  CALCIUM 8.7*    Intake/Output Summary (Last 24 hours) at 01/28/2021 0959 Last data filed at 01/28/2021 0758 Gross per 24 hour  Intake 120 ml  Output -  Net 120 ml        Physical Exam: Vital Signs Blood pressure 114/65, pulse 87, temperature 98.7 F (37.1 C), resp. rate 20, height 5\' 4"  (1.626 m), weight 81.2 kg, SpO2 98 %.  Gen: no distress, normal appearing HEENT: oral mucosa pink and moist, NCAT Cardio: Reg rate Chest: normal effort, normal rate of breathing Abd: soft, non-distended Ext: no edema Psych: pleasant, normal affect Skin: intact Musculoskeletal:     Comments: No edema or tenderness in extremities  Neurological:     alert. Follows basic commands. Moves all 4's. Still dysphonic. Fairly reasonable standing balance. Still impulsive. Improved attention  Assessment/Plan: 1. Functional deficits which require 3+ hours per day of interdisciplinary therapy in a comprehensive inpatient rehab setting.  Physiatrist is providing close team supervision and 24 hour management of active medical problems listed below.  Physiatrist and rehab team continue to assess barriers to discharge/monitor patient progress toward functional and medical goals  Care Tool:  Bathing        Body parts bathed by helper: Right arm,Left arm,Chest,Abdomen,Front perineal area,Buttocks,Right upper leg,Left upper leg,Right lower leg,Left lower leg,Face     Bathing assist Assist Level: Dependent - Patient 0%     Upper Body  Dressing/Undressing Upper body dressing   What is the patient wearing?: Pull over shirt    Upper body assist Assist Level: Maximal Assistance - Patient 25 - 49%    Lower Body Dressing/Undressing Lower body dressing      What is the patient wearing?: Pants     Lower body assist Assist for lower body dressing: Maximal Assistance - Patient 25 - 49%     Toileting Toileting    Toileting assist Assist for toileting: 2 Helpers (2 helpers to turn pt in bed)     Transfers Chair/bed transfer  Transfers assist  Chair/bed transfer activity did not occur: Safety/medical concerns  Chair/bed transfer assist level: Minimal Assistance - Patient > 75%     Locomotion Ambulation   Ambulation assist   Ambulation activity did not occur: Safety/medical concerns  Assist level: Contact Guard/Touching assist Assistive device: No Device Max distance: 100'   Walk 10 feet activity   Assist  Walk 10 feet activity did not occur: Safety/medical concerns  Assist level: Contact Guard/Touching assist Assistive device: No Device   Walk 50 feet activity   Assist Walk 50 feet with 2 turns activity did not occur: Safety/medical concerns  Assist level: Contact Guard/Touching assist Assistive device: No Device    Walk  150 feet activity   Assist Walk 150 feet activity did not occur: Safety/medical concerns  Assist level: Minimal Assistance - Patient > 75% Assistive device: Hand held assist    Walk 10 feet on uneven surface  activity   Assist Walk 10 feet on uneven surfaces activity did not occur: Safety/medical concerns         Wheelchair     Assist Will patient use wheelchair at discharge?: No (Per PT long-term goals)             Wheelchair 50 feet with 2 turns activity    Assist            Wheelchair 150 feet activity     Assist          Blood pressure 114/65, pulse 87, temperature 98.7 F (37.1 C), resp. rate 20, height 5\' 4"  (1.626 m),  weight 81.2 kg, SpO2 98 %.  Medical Problem List and Plan: 1.  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  Antithrombotics: -DVT/anticoagulation: SCDs             -antiplatelet therapy: N/A 3. Pain Management:   -off oxycodone   3/7 tylenol alone for pain and doing well- last received tylenol 2/27 4. Mood: Demonstrates no anxiety or agitation, has been alert during the day, d/c Klonopin.               -antipsychotic agents: Seroquel 25 mg nightly                         This medication is necessary. 5. Neuropsych: This patient is not capable of making decisions on her own behalf.  -Continue ritalin trial to help attention and initation  Continue ritalin  10mg  at 0700 and 1200--attention improving  6. Skin/Wound Care: Routine skin checks 7. Fluids/Electrolytes/Nutrition: TF changed to HS  -recheck labs Monday  8.  Seizure prophylaxis.  Keppra 1000 mg twice daily.  EEG negative 9.  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    - decannulated 3/2 without any problems. Stoma  closed 10.  Dysphagia with decreased nutritional storage.  Gastrostomy tube 12/13/2020 per Dr. Andrey Farmer.  .              -MBS 3/3--> D1/Thin liquids   3/4-change TF to nocturnal 11.  Aspiration pneumonia.  5-day course of Unasyn completed. 12.  Hypertension.  Well controlled, continue Norvasc 10 mg daily, Lopressor 25 mg twice daily.  Patient would follow-up cardiology services.             controlled 2/22  3/4- BP reasonably controlled 14.  Morbid obesity.  BMI 31.47.  Dietary follow-up 15.  Prediabetes.  Hemoglobin A1c 5.9.  CBGs into 200s: decrease TF rate to 70             Moderate increased 16. Tachycardia:  3/7 better controlled with propranolol, but still elevated, increase propanolol to BID  3/9: much better controlled! Continue current  regimen 17. Loose stool:  -improved off reglan---can be a little mushy at times 18. Low grade temp 3/3 x1. No further elevated readings 19. Anemia: Hgb reviewed, down to 10.4 from 11.6 on 3/7,  monitor weekly.   LOS: 18 days A FACE TO FACE EVALUATION WAS PERFORMED  Martha Clan P Raulkar 01/28/2021, 9:59 AM

## 2021-01-28 NOTE — Patient Care Conference (Signed)
Inpatient RehabilitationTeam Conference and Plan of Care Update Date: 01/28/2021   Time: 10:33 AM    Patient Name: [name] [name]      Medical Record Number: [id number]  Date of Birth: June 30, 1966 Sex: Female         Room/Bed: 4W13C/4W13C-01 Payor Info: Payor: MEDICAID POTENTIAL / Plan: MEDICAID POTENTIAL / Product Type: *No Product type* /    Admit Date/Time:  01/10/2021  2:23 PM  Primary Diagnosis:  Anoxic brain injury [facility])  Hospital Problems: Principal Problem:   Anoxic brain injury [facility]) Active Problems:   Malnutrition of moderate degree    Expected Discharge Date: Expected Discharge Date: 02/08/21  Team Members Present: Physician leading conference: Dr. Leeroy Cha Care Coodinator Present: Loralee Pacas, LCSWA;Airel Magadan Creig Hines, RN, BSN, [place] Nurse Present: Mohammed Kindle, RN PT Present: Tereasa Coop, PT OT Present: Cherylynn Ridges, OT SLP Present: Weston Anna, SLP PPS Coordinator present : Gunnar Fusi, SLP     Current Status/Progress Goal Weekly Team Focus  Bowel/Bladder   continent of b/b; LBM: 03/06  maintain regular bowel pattern  assist with toileting needs prn   Swallow/Nutrition/ Hydration   Dys. 1 textures with thin liquids, Min-Mod A  min A  use of swallow strategies, trials of Dys.2   ADL's   Min A overall, mod A cues 2/2 apraxia  Supervision/min A  cognitive retraining, activity tolerance, self-care retrainig, motor planning   Mobility   minA bed mobility, CGA sit to stand, minA stand to sit, CGA gait up to 200' with no AD  minA  balance, motor planning, bed mobility   Communication   Decannulated, Mod-Max A verbal cues for use of an increased vocal intensity  Min A  RMT, breath support and increased vocal intensity to improve intelligibility   Safety/Cognition/ Behavioral Observations  Mod-Max A  Min A  attention, problem solving, recall and awareness. Function impacted by motor planning deficits   Pain   no c/o pain  remain pain free  assess  pain QS and prn   Skin   peg mid abdomen  remain free of skin breakdown/infection  assess skin QS and prn     Discharge Planning:  Pt is uninsured. Pt to d/c to home iwth 24/7 care from husband. They have two children that live in the home (17, 20) who will help as able. Husband also reports another child will be coming in to help when pt initially d/c to home.   Team Discussion: Speech is improving, no pain reported, still running tachy. Continent B/B, CBG's elevated, question intervention? Patient is uninsured. Patient on target to meet rehab goals: Min assist with ADL's, using right arm better. Supervision/min assist goals. Motor planning is a barrier and may have some visual deficits. Currently on Dys 1, thin. RMT training difficult but getting the hang of it, voice is getting louder.  *See Care Plan and progress notes for long and short-term goals.   Revisions to Treatment Plan:  MD adding Propanolol and to increase as tolerated.  Teaching Needs: Family education, medication management, skin/wound care, pain management, BP management, transfer training, gait training, stair training, balance training, endurance training, dysphagia management.  Current Barriers to Discharge: Inaccessible home environment, Decreased caregiver support, Home enviroment access/layout, Wound care, Lack of/limited family support, Medication compliance, Behavior, Nutritional means and lack of insurance.  Possible Resolutions to Barriers: Continue current medications, provide emotional support.     Medical Summary Current Status: Visual deficits, impaired motor planning, tachycardia, dysphagia  Barriers to Discharge: Medical stability  Barriers to Discharge Comments: Visual deficits, impaired motor planning, tachycardia, dysphagia Possible Resolutions to [employer] Focus: continue TF feeds at night, likely contributing to elevated CBGs, add propanolol and increase as tolerated   Continued Need for  Acute Rehabilitation Level of Care: The patient requires daily medical management by a physician with specialized training in physical medicine and rehabilitation for the following reasons: Direction of a multidisciplinary physical rehabilitation program to maximize functional independence : Yes Medical management of patient stability for increased activity during participation in an intensive rehabilitation regime.: Yes Analysis of laboratory values and/or radiology reports with any subsequent need for medication adjustment and/or medical intervention. : Yes   I attest that I was present, lead the team conference, and concur with the assessment and plan of the team.   Cristi Loron 01/28/2021, 3:08 PM

## 2021-01-28 NOTE — Progress Notes (Signed)
Patient refused PRAFO boots x2

## 2021-01-29 LAB — GLUCOSE, CAPILLARY
Glucose-Capillary: 173 mg/dL — ABNORMAL HIGH (ref 70–99)
Glucose-Capillary: 154 mg/dL — ABNORMAL HIGH (ref 70–99)
Glucose-Capillary: 121 mg/dL — ABNORMAL HIGH (ref 70–99)
Glucose-Capillary: 107 mg/dL — ABNORMAL HIGH (ref 70–99)
Glucose-Capillary: 118 mg/dL — ABNORMAL HIGH (ref 70–99)

## 2021-01-29 MED ORDER — OSMOLITE 1.5 CAL PO LIQD
700.0000 mL | ORAL | Status: DC
  Administered 2021-01-29 – 2021-02-02 (×5): 700 mL
  Filled 2021-01-29 (×2): qty 711
  Filled 2021-01-29: qty 1000
  Filled 2021-01-29: qty 711

## 2021-01-29 NOTE — Plan of Care (Signed)
  Problem: Consults Goal: RH BRAIN INJURY PATIENT EDUCATION Description: Description: See Patient Education module for eduction specifics Outcome: Progressing   Problem: RH BOWEL ELIMINATION Goal: RH STG MANAGE BOWEL WITH ASSISTANCE Description: STG Manage Bowel with Assistance. Mod Outcome: Progressing   Problem: RH BLADDER ELIMINATION Goal: RH STG MANAGE BLADDER WITH ASSISTANCE Description: STG Manage Bladder With mod Assistance Outcome: Progressing   Problem: RH SAFETY Goal: RH STG ADHERE TO SAFETY PRECAUTIONS W/ASSISTANCE/DEVICE Description: STG Adhere to Safety Precautions With Assistance/Device. Mod assist Outcome: Progressing   Problem: RH COGNITION-NURSING Goal: RH STG USES MEMORY AIDS/STRATEGIES W/ASSIST TO PROBLEM SOLVE Description: STG Uses Memory Aids/Strategies With Assistance to Problem Solve. Mod assist Outcome: Progressing

## 2021-01-29 NOTE — Progress Notes (Signed)
Initial Nutrition Assessment  DOCUMENTATION CODES:   Non-severe (moderate) malnutrition in context of acute illness/injury  INTERVENTION:   Continue nocturnal tube feeds via G-tube: - Osmolite 1.5 @ 70 ml/hr x 10 hours from 2000 to 0600 (total of 700 ml) - Continue free water flushes of 50 ml q 6 hours   Nocturnal tube feeding regimen provides 1050 kcal, 44 grams of protein, and 533 ml of H2O (meets 62% of kcal needs and 52% of protein needs).  Total free water with flushes: 733 ml  - Continue MVI with minerals daily  - Continue ProSource Plus 30 ml po BID, each supplement provides 100 kcal and 15 grams of protein  - Continue Ensure Enlive po BID, each supplement provides 350 kcal and 20 grams of protein  - Continue Magic Cup BID with lunch and dinner meals, each supplement provides 290 kcal and 9 grams of protein  NUTRITION DIAGNOSIS:   Moderate Malnutrition related to acute illness (cardiac arrest with resulting anoxic brain injury and dysphagia) as evidenced by mild fat depletion,mild muscle depletion,percent weight loss (9.5% weight loss in less than 2 months).  Ongoing, being addressed via TF and oral nutrition supplements  GOAL:   Patient will meet greater than or equal to 90% of their needs  Progressing  MONITOR:   Diet advancement,Labs,Weight trends,TF tolerance,I & O's  REASON FOR ASSESSMENT:   Consult Enteral/tube feeding initiation and management,Assessment of nutrition requirement/status  ASSESSMENT:   55 year old female with PMH of HTN. Presented to [facility] on 12/02/20 with witnessed cardiac arrest. Pt required intubation for airway protection. Pt underwent tracheostomy on 12/13/20 as well as placement of G-tube. Pt remains NPO. Imaging studies consistent with anoxic brain injury. Admitted to CIR on 01/10/21.  3/02 - decannulated 3/03 - s/p MBS, diet advanced to dysphagia 1 with thin liquids  Discussed pt with RN who reports that pt typically consumes  2 full Ensure supplements daily (700 kcal, 40 grams of protein). RN also reports pt takes ProSource Plus supplements without issue (200 kcal, 30 grams of protein). With adjusted tube feeding regimen and Ensure and ProSource Plus supplements, pt meeting 100% of estimated needs. RN reports that pt does best at dinner meal when her husband is present.  Spoke with pt at bedside who reports that she drinks about 1 Ensure daily and takes 1 ProSource daily. Pt reports that meals aren't going very well due to decreased appetite. Pt states that she eats a little bit but then gets full. Discussed plan to reduce tube feeding regimen with pt who expressed understanding. Pt states that she is going to try to eat more so that tube feeding can hopefully be discontinued prior to discharge. RD encouraged pt to consume at least 2 Ensure supplements daily.  Admit weight: 78.4 kg Current weight: 80.3 kg  Meal Completion: 25-50%  Medications reviewed and include: ProSource Plus BID, Ensure Enlive BID, ritalin, MVI with minerals, klor-con, florastor  Labs reviewed. CBG's: 107-173 x 24 hours  Diet Order:   Diet Order            DIET DYS 2 Room service appropriate? Yes; Fluid consistency: Thin  Diet effective 0500           DIET - DYS 1 Room service appropriate? Yes; Fluid consistency: Thin  Diet effective now                 EDUCATION NEEDS:   No education needs have been identified at this time  Skin:  Skin Assessment:  Skin Integrity Issues: Incisions: neck  Last BM:  01/28/21  Height:   Ht Readings from Last 1 Encounters:  01/10/21 5\' 4"  (1.626 m)    Weight:   Wt Readings from Last 1 Encounters:  01/29/21 80.3 kg    BMI:  Body mass index is 30.39 kg/m.  Estimated Nutritional Needs:   Kcal:  1700-1900  Protein:  85-100 grams  Fluid:  1.7-1.9 L    Gustavus Bryant, MS, RD, LDN Inpatient Clinical Dietitian Please see AMiON for contact information.

## 2021-01-29 NOTE — Progress Notes (Signed)
Physical Therapy Session Note  Patient Details  Name: [name] [name] MRN: [id number] Date of Birth: [birth year]  Today's Date: 01/29/2021 PT Individual Time: 1164-3539 PT Individual Time Calculation (min): 27 min   Short Term Goals: Week 2:  PT Short Term Goal 1 (Week 2): Pt will complete bed mobility with minA consistently. PT Short Term Goal 1 - Progress (Week 2): Progressing toward goal PT Short Term Goal 2 (Week 2): Pt will complete bed to chair transfer with minA consistently. PT Short Term Goal 2 - Progress (Week 2): Met PT Short Term Goal 3 (Week 2): Pt will ambulates 100' with minA and LRAD. PT Short Term Goal 3 - Progress (Week 2): Met PT Short Term Goal 4 (Week 2): Pt will perform Berg Balance assessment. PT Short Term Goal 4 - Progress (Week 2): Met Week 3:  PT Short Term Goal 1 (Week 3): Pt will perform bed mobility consistently with minA. PT Short Term Goal 2 (Week 3): Pt will perform bed to chair transfer consistently with CGA. PT Short Term Goal 3 (Week 3): Pt will ambulate 300' with CGA and LRAD. PT Short Term Goal 4 (Week 3): Pt will improve Berg score by MCID.  Skilled Therapeutic Interventions/Progress Updates:   Received pt sitting EOB with lunch tray untouched, pt agreeable to therapy, and denied any pain during session and reported not feeling hungry to eat. Session with emphasis on functional mobility/transfers, generalized strengthening, and improved activity tolerance. Pt transferred bed<>WC stand<>pivot without AD and min A with min/mod cues for turning technique and motor planning. Upon sitting pt's R hip not completely in WC requring min A and mod cues to scoot hips back. Dependent transfer to dayroom and stand<>pivot WC<>Nustep in same manner. Pt performed BUE/LE strengthening on Nustep at workload 2 increasing to 3 for 8 minutes for a total of 282 steps with emphasis on cardiovascular endurance and reciprocal movement. Pt required hand over hand guidance to place  and maintain UEs on handgrips and LEs on footplates. Returned to room and pt requested to return to bed. Stand<>pivot without AD and min A. Concluded session with pt sitting EOB with waist restraint on, needs within reach, and bed alarm on.   Therapy Documentation Precautions:  Precautions Precautions: Fall,Other (comment) Precaution Comments: O2 via trach collar; PEG tube, delayed processing/initiation Restrictions Weight Bearing Restrictions: No RUE Weight Bearing: Non weight bearing   Therapy/Group: Individual Therapy Alfonse Alpers PT, DPT   01/29/2021, 7:44 AM

## 2021-01-29 NOTE — Progress Notes (Signed)
Physical Therapy Session Note  Patient Details  Name: [name] [name] MRN: [id number] Date of Birth: 12-14-1965  Today's Date: 01/29/2021 PT Individual Time: 1455-1540 PT Individual Time Calculation (min): 45 min   Short Term Goals: Week 3:  PT Short Term Goal 1 (Week 3): Pt will perform bed mobility consistently with minA. PT Short Term Goal 2 (Week 3): Pt will perform bed to chair transfer consistently with CGA. PT Short Term Goal 3 (Week 3): Pt will ambulate 300' with CGA and LRAD. PT Short Term Goal 4 (Week 3): Pt will improve Berg score by MCID.  Skilled Therapeutic Interventions/Progress Updates:     Patient in bed with Posey waist belt donned, but unalarmed upon PT arrival, RN made aware. Patient alert and agreeable to PT session. Patient denied pain during session.   Patient mildly impulsive and emotionally labile throughout session. Provided emotional support and therapeutic listening as patient discussed challenges and concerns about intrapersonal relationship changes with her family since admission. Provided BI education on recovery, changes in life roles, intrapersonal communication, and deficits and strategies to manage deficits throughout session. Patient also taken to "Unmasking Your Brain Injury" display and retrieved 6 handouts for her and her family to read about BI education. Patient expressed interest in making a mask for the project as well, will coordinate with lead therapy team.   Therapeutic Activity: Bed Mobility: Patient performed supine to/from sit with mod I using hospital bed features. Donned B tennis shoes with total A for time/energy management. Transfers: Patient performed sit to/from stand x3 with CGA-close supervison. Performed blocked practice stand pivot transfers R/L x4 with CGA-close supervision and facilitation x2 for completing full turn before sitting with mild improvement after. Provided verbal cues for sequencing and safety awareness.  Gait Training:   Patient ambulated >150 feet and >250 feet without AD with CGA and intermittent min A. Ambulated with decreased gait speed, step height and step length, decreased arm swing and trunk rotation, and reduced balance on turns. Provided verbal cues for visual scanning, attention to foot placement on turns, and increased arm swing and gait speed for improved balance and step length. Patient able to navigate from her room to the main gym without cues when instructed on first trial. On second trial patient performed dynamic gait activities, including horizontal and vertical head turns, sudden stops, 180 deg turns, and ambulated in tandem on green line x20 ft with only 1 L LOB.  Patient ambulated forwards, backwards, and side stepping R/L 20 feet x2 with CGA-min A focused on increased step length in both directions following multimodal cues. Patient ascended/descended 12-6" steps using B rails with min A progressing to CGA. Performed step-to progressing to reciprocal gait pattern. Provided cues for technique, safety awareness, and sequencing.   Patient in bed at end of session with breaks locked, bed and Posey waist belt alarm set, and all needs within reach. Patient requested to return to the room at end of session due to feeling "emotionally tired" following discussion about her family, see above. Patient missed 15 min of skilled PT due to fatigue, RN made aware. Will attempt to make-up missed time as able.     Therapy Documentation Precautions:  Precautions Precautions: Fall,Other (comment) Precaution Comments: O2 via trach collar; PEG tube, delayed processing/initiation Restrictions Weight Bearing Restrictions: No RUE Weight Bearing: Non weight bearing General: PT Amount of Missed Time (min): 15 Minutes PT Missed Treatment Reason: Patient fatigue;Other (Comment) (emotional fatigue)   Therapy/Group: Individual Therapy  Cherie L Grunenberg  PT, DPT  01/29/2021, 17:47 PM

## 2021-01-29 NOTE — Progress Notes (Signed)
Speech Language Pathology Daily Session Note  Patient Details  Name: [name] [name] MRN: [id number] Date of Birth: Sep 05, 1966  Today's Date: 01/29/2021 SLP Individual Time: 0930-1015 SLP Individual Time Calculation (min): 45 min  Short Term Goals: Week 3: SLP Short Term Goal 1 (Week 3): Patient will consume current diet with minimal overt s/s of aspiration and Min A verbal cues for use of swallowing compensatory strategies. SLP Short Term Goal 2 (Week 3): Patient will utilize an increased vocal intensity to achieve ~75% intelligibility at the phrase level with Mod A verbal and visual cues. SLP Short Term Goal 3 (Week 3): Patient will demonstrate sustained attention to functional tasks for 5 minutes with Mod verbal cues for redirection. SLP Short Term Goal 4 (Week 3): Patient will utilize external memory aids for orientaiton place and time with Mod A verbal and visual cues. SLP Short Term Goal 5 (Week 3): Patient will demonstrate functional problem solving for basic and familiar tasks with Mod A verbal and visual cues.  Skilled Therapeutic Interventions:Skilled treatment session focused on dysphagia and cognitive goals. SLP facilitated session by providing Min A verbal and visual cues for patient to perform 25 repetitions of EMST and IMST exercises. Patient required cues for motor planning and patient did not report any adverse side effects. SLP also provided trials of Dys. 2 textures in which patient demonstrated efficient mastication with complete oral clearance without overt s/s of aspiration. Recommend trial tray prior to upgrade. Patient reported that she was upset because she was unable to recall basic biographical information. Therefore, SLP created a memory notebook to write down the names of her children, phone number and address per her request. Patient left upright in bed with alarm on and all needs within reach. Continue with current plan of care.      Pain No/Denies Pain    Therapy/Group: Individual Therapy  Tiyah Zelenak 01/29/2021, 12:51 PM

## 2021-01-29 NOTE — Progress Notes (Signed)
PROGRESS NOTE   Subjective/Complaints: No complaints this morning Tearful, emotional when I commended her progress Denies pain  ROS: denies pain    Objective:   No results found. Recent Labs    01/27/21 0450  WBC 5.9  HGB 10.4*  HCT 32.6*  PLT 189   Recent Labs    01/27/21 0450  NA 139  K 3.6  CL 104  CO2 24  GLUCOSE 157*  BUN 10  CREATININE 0.48  CALCIUM 8.7*    Intake/Output Summary (Last 24 hours) at 01/29/2021 1116 Last data filed at 01/29/2021 0634 Gross per 24 hour  Intake 1120 ml  Output 300 ml  Net 820 ml        Physical Exam: Vital Signs Blood pressure 127/78, pulse 95, temperature 98.5 F (36.9 C), resp. rate 18, height 5\' 4"  (1.626 m), weight 80.3 kg, SpO2 98 %.  Gen: no distress, normal appearing HEENT: oral mucosa pink and moist, NCAT Cardio: Reg rate Chest: normal effort, normal rate of breathing Abd: soft, non-distended Ext: no edema Psych: pleasant, normal affect Skin: intact Musculoskeletal:     Comments: No edema or tenderness in extremities  Neurological:     alert. Follows basic commands. Moves all 4's. Still dysphonic. Fairly reasonable standing balance. Still impulsive. Improved attention  Assessment/Plan: 1. Functional deficits which require 3+ hours per day of interdisciplinary therapy in a comprehensive inpatient rehab setting.  Physiatrist is providing close team supervision and 24 hour management of active medical problems listed below.  Physiatrist and rehab team continue to assess barriers to discharge/monitor patient progress toward functional and medical goals  Care Tool:  Bathing        Body parts bathed by helper: Right arm,Left arm,Chest,Abdomen,Front perineal area,Buttocks,Right upper leg,Left upper leg,Right lower leg,Left lower leg,Face     Bathing assist Assist Level: Dependent - Patient 0%     Upper Body Dressing/Undressing Upper body dressing    What is the patient wearing?: Pull over shirt    Upper body assist Assist Level: Maximal Assistance - Patient 25 - 49%    Lower Body Dressing/Undressing Lower body dressing      What is the patient wearing?: Pants     Lower body assist Assist for lower body dressing: Maximal Assistance - Patient 25 - 49%     Toileting Toileting    Toileting assist Assist for toileting: 2 Helpers (2 helpers to turn pt in bed)     Transfers Chair/bed transfer  Transfers assist  Chair/bed transfer activity did not occur: Safety/medical concerns  Chair/bed transfer assist level: Minimal Assistance - Patient > 75%     Locomotion Ambulation   Ambulation assist   Ambulation activity did not occur: Safety/medical concerns  Assist level: Contact Guard/Touching assist Assistive device: No Device Max distance: 100'   Walk 10 feet activity   Assist  Walk 10 feet activity did not occur: Safety/medical concerns  Assist level: Contact Guard/Touching assist Assistive device: No Device   Walk 50 feet activity   Assist Walk 50 feet with 2 turns activity did not occur: Safety/medical concerns  Assist level: Contact Guard/Touching assist Assistive device: No Device    Walk 150 feet activity  Assist Walk 150 feet activity did not occur: Safety/medical concerns  Assist level: Minimal Assistance - Patient > 75% Assistive device: Hand held assist    Walk 10 feet on uneven surface  activity   Assist Walk 10 feet on uneven surfaces activity did not occur: Safety/medical concerns         Wheelchair     Assist Will patient use wheelchair at discharge?: No (Per PT long-term goals)             Wheelchair 50 feet with 2 turns activity    Assist            Wheelchair 150 feet activity     Assist          Blood pressure 127/78, pulse 95, temperature 98.5 F (36.9 C), resp. rate 18, height 5\' 4"  (1.626 m), weight 80.3 kg, SpO2 98 %.  Medical  Problem List and Plan: 1.  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  Antithrombotics: -DVT/anticoagulation: Continue SCDs             -antiplatelet therapy: N/A 3. Pain Management:   -off oxycodone   3/9 tylenol alone for pain and doing well- last received tylenol 2/27 4. Mood: Demonstrates no anxiety or agitation, has been alert during the day, d/c Klonopin.               -antipsychotic agents: Continue Seroquel 25 mg nightly                         This medication is necessary. 5. Neuropsych: This patient is not capable of making decisions on her own behalf.  -Continue ritalin trial to help attention and initation  Continue ritalin  10mg  at 0700 and 1200--attention improving  6. Skin/Wound Care: Routine skin checks 7. Fluids/Electrolytes/Nutrition: TF changed to HS  -recheck labs Monday  8.  Seizure prophylaxis.  Keppra 1000 mg twice daily.  EEG negative 9.  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    - decannulated 3/2 without any problems. Stoma  closed 10.  Dysphagia with decreased nutritional storage.  Gastrostomy tube 12/13/2020 per Dr. Andrey Farmer.  .              -MBS 3/3--> D1/Thin liquids   3/4-change TF to nocturnal 11.  Aspiration pneumonia.  5-day course of Unasyn completed. 12.  Hypertension.  Well controlled, continue Norvasc 10 mg daily, Lopressor 25 mg twice daily.  Patient would follow-up cardiology services.             controlled 2/22  3/4- BP reasonably controlled 14.  Morbid obesity.  BMI 31.47.  Dietary follow-up 15.  Prediabetes.  Hemoglobin A1c 5.9.  CBGs into 200s: decrease TF rate to 70  3/9: CBGs better controlled- continue to monitor             Moderate increased 16. Tachycardia:  3/7 better controlled with propranolol, but still elevated, increase propanolol to BID  3/9: much better  controlled! Continue current regimen 17. Loose stool:  -improved off reglan---can be a little mushy at times 18. Low grade temp 3/3 x1. No further elevated readings 19. Anemia: Hgb reviewed, down to  10.4 from 11.6 on 3/7, monitor weekly.   LOS: 19 days A FACE TO FACE EVALUATION WAS PERFORMED  Martha Clan P Dani Danis 01/29/2021, 11:16 AM

## 2021-01-29 NOTE — Progress Notes (Signed)
Occupational Therapy Session Note  Patient Details  Name: [name] [name] MRN: [id number] Date of Birth: September 30, 1966  Today's Date: 01/29/2021 OT Individual Time: 0700-0757 OT Individual Time Calculation (min): 57 min    Short Term Goals: Week 1:  OT Short Term Goal 1 (Week 1): Pt will maintain unsupported sitting balance during self-care tasks for 5 mins with supevision OT Short Term Goal 1 - Progress (Week 1): Met OT Short Term Goal 2 (Week 1): Pt will complete UB bathing with mod assist OT Short Term Goal 2 - Progress (Week 1): Progressing toward goal OT Short Term Goal 3 (Week 1): Pt will complete UB dressing with max assist OT Short Term Goal 3 - Progress (Week 1): Met OT Short Term Goal 4 (Week 1): Pt will complete toilet transfer with max assist of 1 caregiver OT Short Term Goal 4 - Progress (Week 1): Met OT Short Term Goal 5 (Week 1): Pt will complete UB bathing with mod assist OT Short Term Goal 5 - Progress (Week 1): Met  Skilled Therapeutic Interventions/Progress Updates:    1:1. Pt received in bed agreeable to OT reporting wanting to change clothing and eat breakfast. Pt completes all transfers at S-CGA overall with steadying only at hips. Pt overall requires MIN A for dressing tasks at sink d/t apraxia. Pt does best if pt thread head into shirt first then arms. Pt completes standing grooming at sink with significantly extra time to locate items on sink as pt very distracted by combs/brushes in L upper sink. Pt requires HOH A throughotu for application of toothapste onto toothbrush and then for management of packages and containers while seated in recliner for self feeding. Pt requires no cuing for SLP feeding strategies and no overt s/s of aspiration. Pt returned to bed exit alarm on and call light tin reach at end of session.   Therapy Documentation Precautions:  Precautions Precautions: Fall,Other (comment) Precaution Comments: O2 via trach collar; PEG tube, delayed  processing/initiation Restrictions Weight Bearing Restrictions: No RUE Weight Bearing: Non weight bearing General:   Vital Signs: Therapy Vitals Temp: 98.5 F (36.9 C) Pulse Rate: 91 Resp: 18 BP: 126/77 Patient Position (if appropriate): Lying Oxygen Therapy SpO2: 98 % O2 Device: Room Air Pain: Pain Assessment Pain Score: 0-No pain ADL: ADL Eating: NPO Grooming: Dependent Where Assessed-Grooming: Edge of bed Upper Body Bathing: Dependent Where Assessed-Upper Body Bathing: Edge of bed Lower Body Bathing: Dependent Where Assessed-Lower Body Bathing: Bed level Upper Body Dressing: Dependent Where Assessed-Upper Body Dressing: Edge of bed Lower Body Dressing: Dependent Where Assessed-Lower Body Dressing: Bed level Vision   Perception    Praxis   Exercises:   Other Treatments:     Therapy/Group: Individual Therapy  Tonny Branch 01/29/2021, 7:57 AM

## 2021-01-30 LAB — GLUCOSE, CAPILLARY
Glucose-Capillary: 121 mg/dL — ABNORMAL HIGH (ref 70–99)
Glucose-Capillary: 132 mg/dL — ABNORMAL HIGH (ref 70–99)
Glucose-Capillary: 142 mg/dL — ABNORMAL HIGH (ref 70–99)
Glucose-Capillary: 177 mg/dL — ABNORMAL HIGH (ref 70–99)
Glucose-Capillary: 177 mg/dL — ABNORMAL HIGH (ref 70–99)
Glucose-Capillary: 179 mg/dL — ABNORMAL HIGH (ref 70–99)

## 2021-01-30 NOTE — Progress Notes (Signed)
Occupational Therapy Session Note  Patient Details  Name: [name] [name] MRN: [id number] Date of Birth: 02-Jun-1966  Today's Date: 01/30/2021 OT Individual Time: 3570-1779 OT Individual Time Calculation (min): 58 min   Short Term Goals: Week 3:  OT Short Term Goal 1 (Week 3): Patient will complete 1 step of toileting task OT Short Term Goal 2 (Week 3): Pt will locate 3 items on R side of sink with min verbal cues OT Short Term Goal 3 (Week 3): Pt will tolerate standing for 5 minutes in preparation for BADL task  Skilled Therapeutic Interventions/Progress Updates:    Patient greeted semi-reclined in bed and agreeable to OT treatment session. OT gave pt options for activity, going to the gym, or showering. Pt stated she wanted to shower. Pt completed bed mobility with supervision. Min HHA to ambulate into bathroom. Verbal cues for safety to sit down to step out of pants, but pt appropriately initiated doffing clothes prior to shower. Pt required less cues for initiation and sequencing during bathing tasks today. Pt continues to struggle with dressing tasks 2/2 apraxia. Blocked practice for LB dressing trials. Pt forgetting to thread L LE into pants and trying to stand to pull them up and unaware why this wouldn't work. OT provided verbal, visual, and tactile cues to help pt recall to dress her L side. OT oriented shirt for pt, but she was unable to problem solve finding the bottom of the shirt to begin process of donning shirt. Pt brought to therapy gym and worked on simple 3 color pattern activity with shapes. Pt was able to verbalize the pattern, and even pull the right color from the bin, however, she had difficulty lining up the pattern, often placing colored shape  Underneath instead of beside the shape.Pt returned to room at end of session and completed stand-pivot back to bed with close supervision. Pt left with bed alarm belt on, call bell in reach, and bed alarm on as well.   Therapy  Documentation Precautions:  Precautions Precautions: Fall,Other (comment) Precaution Comments: O2 via trach collar; PEG tube, delayed processing/initiation Restrictions Weight Bearing Restrictions: No RUE Weight Bearing: Non weight bearing Pain:  denies pain   Therapy/Group: Individual Therapy  Valma Cava 01/30/2021, 2:21 PM

## 2021-01-30 NOTE — Progress Notes (Signed)
Physical Therapy Session Note  Patient Details  Name: [name] [name] MRN: [id number] Date of Birth: 22-Sep-1966  Today's Date: 01/30/2021 PT Individual Time: 0815-0900 PT Individual Time Calculation (min): 45 min   Short Term Goals: Week 3:  PT Short Term Goal 1 (Week 3): Pt will perform bed mobility consistently with minA. PT Short Term Goal 2 (Week 3): Pt will perform bed to chair transfer consistently with CGA. PT Short Term Goal 3 (Week 3): Pt will ambulate 300' with CGA and LRAD. PT Short Term Goal 4 (Week 3): Pt will improve Berg score by MCID.  Skilled Therapeutic Interventions/Progress Updates:     Pt misses x15 minutes of skilled PT due to receiving morning meds from RN. Pt received seated in WC and agrees to therapy. WC transport to gym for time management. Pt performs NRM for standing balance and fine motor skills of upper extremities, with cognitive overlay. Pt performs sit to stand with CGA, then stands on airex mat to provide unrliable somatosensory input and challenge balance. Pt tasked with placing pegs in small holes on wooden plank. Pt requires significantly increased time to complete x9 pegs, with max cues on orientation of pegs and placement. Pt is able to remove all pegs from holes without cueing. Pt then performs letter sequencing task, and is able to correctly identify name when letters are arranged, but is unable to recreate correct sequence or orientation, despite max time and cueing provided. Pt practices identifying a series of shorter words and requires step by step cueing, but able to correctly identify 4 letter words. Pt ambulates x150' back to room with minA at hips for stability. Sit to supine with cues on sequencing. Left with alarm intact and all needs within reach.  Therapy Documentation Precautions:  Precautions Precautions: Fall,Other (comment) Precaution Comments: O2 via trach collar; PEG tube, delayed processing/initiation Restrictions Weight Bearing  Restrictions: No RUE Weight Bearing: Non weight bearing   Therapy/Group: Individual Therapy  Breck Coons, PT, DPT 01/30/2021, 4:17 PM

## 2021-01-30 NOTE — Progress Notes (Signed)
PROGRESS NOTE   Subjective/Complaints: [name] [name] has no complaints this morning.   ROS: denies pain    Objective:   No results found. No results for input(s): WBC, HGB, HCT, PLT in the last 72 hours. No results for input(s): NA, K, CL, CO2, GLUCOSE, BUN, CREATININE, CALCIUM in the last 72 hours.  Intake/Output Summary (Last 24 hours) at 01/30/2021 1354 Last data filed at 01/30/2021 1300 Gross per 24 hour  Intake 480 ml  Output --  Net 480 ml        Physical Exam: Vital Signs Blood pressure (!) 141/63, pulse 84, temperature 98.7 F (37.1 C), temperature source Oral, resp. rate 18, height 5\' 4"  (1.626 m), weight 80.3 kg, SpO2 100 %.  Gen: no distress, normal appearing HEENT: oral mucosa pink and moist, NCAT Cardio: Reg rate Chest: normal effort, normal rate of breathing Abd: soft, non-distended Ext: no edema Psych: pleasant, normal affect Skin: intact Musculoskeletal:     Comments: No edema or tenderness in extremities  Neurological:     alert. Follows basic commands. Moves all 4's. Still dysphonic. Fairly reasonable standing balance. Still impulsive. Improved attention  Assessment/Plan: 1. Functional deficits which require 3+ hours per day of interdisciplinary therapy in a comprehensive inpatient rehab setting.  Physiatrist is providing close team supervision and 24 hour management of active medical problems listed below.  Physiatrist and rehab team continue to assess barriers to discharge/monitor patient progress toward functional and medical goals  Care Tool:  Bathing        Body parts bathed by helper: Right arm,Left arm,Chest,Abdomen,Front perineal area,Buttocks,Right upper leg,Left upper leg,Right lower leg,Left lower leg,Face     Bathing assist Assist Level: Dependent - Patient 0%     Upper Body Dressing/Undressing Upper body dressing   What is the patient wearing?: Pull over shirt    Upper  body assist Assist Level: Maximal Assistance - Patient 25 - 49%    Lower Body Dressing/Undressing Lower body dressing      What is the patient wearing?: Pants     Lower body assist Assist for lower body dressing: Maximal Assistance - Patient 25 - 49%     Toileting Toileting    Toileting assist Assist for toileting: 2 Helpers (2 helpers to turn pt in bed)     Transfers Chair/bed transfer  Transfers assist  Chair/bed transfer activity did not occur: Safety/medical concerns  Chair/bed transfer assist level: Minimal Assistance - Patient > 75%     Locomotion Ambulation   Ambulation assist   Ambulation activity did not occur: Safety/medical concerns  Assist level: Contact Guard/Touching assist Assistive device: No Device Max distance: 100'   Walk 10 feet activity   Assist  Walk 10 feet activity did not occur: Safety/medical concerns  Assist level: Contact Guard/Touching assist Assistive device: No Device   Walk 50 feet activity   Assist Walk 50 feet with 2 turns activity did not occur: Safety/medical concerns  Assist level: Contact Guard/Touching assist Assistive device: No Device    Walk 150 feet activity   Assist Walk 150 feet activity did not occur: Safety/medical concerns  Assist level: Minimal Assistance - Patient > 75% Assistive device: Hand held assist  Walk 10 feet on uneven surface  activity   Assist Walk 10 feet on uneven surfaces activity did not occur: Safety/medical concerns         Wheelchair     Assist Will patient use wheelchair at discharge?: No (Per PT long-term goals)             Wheelchair 50 feet with 2 turns activity    Assist            Wheelchair 150 feet activity     Assist          Blood pressure (!) 141/63, pulse 84, temperature 98.7 F (37.1 C), temperature source Oral, resp. rate 18, height 5\' 4"  (1.626 m), weight 80.3 kg, SpO2 100 %.  Medical Problem List and Plan: 1.  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  Antithrombotics: -DVT/anticoagulation: Continue SCDs             -antiplatelet therapy: N/A 3. Pain Management:   -off oxycodone   3/9 tylenol alone for pain and doing well- last received tylenol 2/27 4. Mood: Demonstrates no anxiety or agitation, has been alert during the day, d/c Klonopin.               -antipsychotic agents: Continue Seroquel 25 mg nightly                         This medication is necessary. 5. Neuropsych: This patient is not capable of making decisions on her own behalf.  -Continue ritalin trial to help attention and initation  Continue ritalin  10mg  at 0700 and 1200--attention improving  6. Skin/Wound Care: Routine skin checks 7. Fluids/Electrolytes/Nutrition: TF changed to HS  -recheck labs Monday  8.  Seizure prophylaxis.  Keppra 1000 mg twice daily.  EEG negative 9.  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    - decannulated 3/2 without any problems. Stoma  closed 10.  Dysphagia with decreased nutritional storage.  Gastrostomy tube 12/13/2020 per Dr. Andrey Farmer.  .              -MBS 3/3--> D1/Thin liquids   3/4-change TF to nocturnal 11.  Aspiration pneumonia.  5-day course of Unasyn completed. 12.  Hypertension.  Well controlled, continue Norvasc 10 mg daily, Lopressor 25 mg twice daily.  Patient would follow-up cardiology services.             controlled 2/22  3/4- BP reasonably controlled 14.  Morbid obesity.  BMI 31.47.  Dietary follow-up 15.  Prediabetes.  Hemoglobin A1c 5.9.  CBGs into 200s: decrease TF rate to 70  3/9: CBGs better controlled- continue to monitor             Moderate increased 16. Tachycardia:  3/7 better controlled with propranolol, but still elevated, increase propanolol to BID  3/9-3/10: much better controlled! Continue current  regimen 17. Loose stool:  -improved off reglan---can be a little mushy at times 18. Low grade temp 3/3 x1. No further elevated readings 19. Anemia: Hgb reviewed, down to 10.4 from 11.6 on 3/7, monitor weekly.   LOS: 20 days A FACE TO FACE EVALUATION WAS PERFORMED  [name] [name] [name] [name] 01/30/2021, 1:54  PM    

## 2021-01-30 NOTE — Progress Notes (Signed)
Speech Language Pathology Daily Session Note  Patient Details  Name: [name] [name] MRN: [id number] Date of Birth: [birth year]  Today's Date: 01/30/2021 SLP Individual Time: 0700-0800 SLP Individual Time Calculation (min): 60 min  Short Term Goals: Week 3: SLP Short Term Goal 1 (Week 3): Patient will consume current diet with minimal overt s/s of aspiration and Min A verbal cues for use of swallowing compensatory strategies. SLP Short Term Goal 2 (Week 3): Patient will utilize an increased vocal intensity to achieve ~75% intelligibility at the phrase level with Mod A verbal and visual cues. SLP Short Term Goal 3 (Week 3): Patient will demonstrate sustained attention to functional tasks for 5 minutes with Mod verbal cues for redirection. SLP Short Term Goal 4 (Week 3): Patient will utilize external memory aids for orientaiton place and time with Mod A verbal and visual cues. SLP Short Term Goal 5 (Week 3): Patient will demonstrate functional problem solving for basic and familiar tasks with Mod A verbal and visual cues.  Skilled Therapeutic Interventions: Skilled treatment session focused on dysphagia and cognitive goals. SLP facilitated session by providing set-up assist with breakfast meal of Dys. 2 textures with thin liquids. Patient consumed meal without overt s/s of aspiration and required overall Min verbal cues for problem solving with self-feeding. Recommend patient continue current diet. SLP also facilitated session by providing Mod A verbal cues for use of written aids for recall of biographical information. Min-Mod A verbal cues were also needed for use of an increased vocal intensity at the phrase and sentence level to achieve ~80% intelligibility. Patient handed off to RN. Continue with current plan of care.      Pain No/Denies Pain   Therapy/Group: Individual Therapy  Edona Schreffler 01/30/2021, 1:27 PM

## 2021-01-31 LAB — GLUCOSE, CAPILLARY
Glucose-Capillary: 107 mg/dL — ABNORMAL HIGH (ref 70–99)
Glucose-Capillary: 111 mg/dL — ABNORMAL HIGH (ref 70–99)
Glucose-Capillary: 154 mg/dL — ABNORMAL HIGH (ref 70–99)
Glucose-Capillary: 161 mg/dL — ABNORMAL HIGH (ref 70–99)
Glucose-Capillary: 167 mg/dL — ABNORMAL HIGH (ref 70–99)
Glucose-Capillary: 170 mg/dL — ABNORMAL HIGH (ref 70–99)
Glucose-Capillary: 183 mg/dL — ABNORMAL HIGH (ref 70–99)

## 2021-01-31 NOTE — Progress Notes (Signed)
Physical Therapy Session Note  Patient Details  Name: [name] [name] MRN: [id number] Date of Birth: 11-05-1966  Today's Date: 01/31/2021 PT Individual Time: 0804-0900 PT Individual Time Calculation (min): 56 min   Short Term Goals: Week 3:  PT Short Term Goal 1 (Week 3): Pt will perform bed mobility consistently with minA. PT Short Term Goal 2 (Week 3): Pt will perform bed to chair transfer consistently with CGA. PT Short Term Goal 3 (Week 3): Pt will ambulate 300' with CGA and LRAD. PT Short Term Goal 4 (Week 3): Pt will improve Berg score by MCID.  Skilled Therapeutic Interventions/Progress Updates:     Pt received seated in recliner and agrees to therapy. No complaint of pain. Sit to stand and stand step transfer to [facility] with CGA and improving motor planning. WC transport to gym for time management. Pt transfer to mat table with CGA. PT instructs pt to "weave in and out of cones" to test command following ability, working memory, and dynamic gait. PT demonstrates task several times, then asks pt to perform. Pt is unable to follow instructions correctly, instead attempting to pick up cones, despite max cueing to perform desired task. PT demos several more times but pt still unable to perform accurately. PT then cues pt to "pick up" cones, and pt is able to do with CGA for safety and assistance stacking cones. Pt then tasked with "scavenger hunt" activity, asked to find specific numbers taped to the wall in dayroom. Pt ambulates around dayroom with CGA, and is able to consistently locate numbers without assistance. PT then increases challenge by asking pt to locate specific objects in room, such as a "blue chair". Pt requires max cueing to adjust to this task but is eventually able to find blue chair. Pt continues to perform scavenger hunt task, ambulating >150' in the process, with PT providing CGA and continuous cues to avoid furniture and other obstacles in room. Following, session, pt left seated  in recliner with alarm intact and all needs within reach.  Therapy Documentation Precautions:  Precautions Precautions: Fall,Other (comment) Precaution Comments: O2 via trach collar; PEG tube, delayed processing/initiation Restrictions Weight Bearing Restrictions: No RUE Weight Bearing: Non weight bearing    Therapy/Group: Individual Therapy  Breck Coons, PT, DPT 01/31/2021, 4:22 PM

## 2021-01-31 NOTE — Progress Notes (Signed)
Occupational Therapy Session Note  Patient Details  Name: [name] [name] MRN: [id number] Date of Birth: [birth year]  Today's Date: 01/31/2021 OT Individual Time: 1002-1100 OT Individual Time Calculation (min): 58 min    Short Term Goals: Week 3:  OT Short Term Goal 1 (Week 3): Patient will complete 1 step of toileting task OT Short Term Goal 2 (Week 3): Pt will locate 3 items on R side of sink with min verbal cues OT Short Term Goal 3 (Week 3): Pt will tolerate standing for 5 minutes in preparation for BADL task  Skilled Therapeutic Interventions/Progress Updates:    Pt greeted sitting in recliner and agreeable to OT treatment session. Pt ambulated in room with CGA to the wc. Pt brought down to therapy gym and worked on R hand fine motor control with graded peg board task. B UE and hand eye coordination with seated ball toss, progressing to standing ball toss with twist. Pt initialy with difficulty motor planning twist, but able to get with repetition and demonstration. Continued working on fine motor control with theraputty activity and locating small beads throughout putty. Pt returned to room at end of session and semi-reclined in bed with bed alarm on and call bell in reach.   Therapy Documentation Precautions:  Precautions Precautions: Fall,Other (comment) Precaution Comments: O2 via trach collar; PEG tube, delayed processing/initiation Restrictions Weight Bearing Restrictions: No RUE Weight Bearing: Non weight bearing Pain: Pain Assessment Pain Scale: 0-10 Pain Score: 0-No pain   Therapy/Group: Individual Therapy  Valma Cava 01/31/2021, 10:46 AM

## 2021-01-31 NOTE — Progress Notes (Signed)
Occupational Therapy Session Note  Patient Details  Name: [name] [name] MRN: [id number] Date of Birth: [birth year]  Today's Date: 01/31/2021 OT Individual Time: 1300-1330 OT Individual Time Calculation (min): 30 min    Short Term Goals: Week 3:  OT Short Term Goal 1 (Week 3): Patient will complete 1 step of toileting task OT Short Term Goal 2 (Week 3): Pt will locate 3 items on R side of sink with min verbal cues OT Short Term Goal 3 (Week 3): Pt will tolerate standing for 5 minutes in preparation for BADL task  Skilled Therapeutic Interventions/Progress Updates:    Pt resting in recliner upon arrival. Amb with HHA to w/c and transported to gym for time management. Table tasks with colored large lego blocks and colored pegs. Pt correctly named colors. Pt tasked with stacking blocks. Pt unable to correctly orient blocks to stack. Pt able to stack blocks when when presented with correct orientation. Pt unable to correctly place pegs in peg board regardless of orientation. Pt returned to room and returned to recliner. Pt remained in recliner with belt alarm activated and secure. All needs within reach.  Therapy Documentation Precautions:  Precautions Precautions: Fall,Other (comment) Precaution Comments: O2 via trach collar; PEG tube, delayed processing/initiation Restrictions Weight Bearing Restrictions: No RUE Weight Bearing: Non weight bearing   Pain:  Pt denies pain and no s/s of pain   Therapy/Group: Individual Therapy  Leroy Libman 01/31/2021, 1:38 PM

## 2021-01-31 NOTE — Progress Notes (Signed)
Patient is restful most of shift in no acute distress or discomfort, easily redirected, [name] [name] pain ,no active seizures noted, Continue Osmolite @ 70 cc/hr via Peg tube w/o discomfort or difficulty.Tele-sitter in place for safety purposes and monitoring. Continue regime

## 2021-01-31 NOTE — Progress Notes (Signed)
PROGRESS NOTE   Subjective/Complaints: Mrs. Furnari has no complaints. Vitals are stable.  Continues on Osmolite.  Did well physically with therapy this morning- still with difficulties with motor planning.   ROS: denies pain    Objective:   No results found. No results for input(s): WBC, HGB, HCT, PLT in the last 72 hours. No results for input(s): NA, K, CL, CO2, GLUCOSE, BUN, CREATININE, CALCIUM in the last 72 hours.  Intake/Output Summary (Last 24 hours) at 01/31/2021 0853 Last data filed at 01/31/2021 0846 Gross per 24 hour  Intake 2049 ml  Output --  Net 2049 ml        Physical Exam: Vital Signs Blood pressure 108/69, pulse 80, temperature 97.8 F (36.6 C), temperature source Oral, resp. rate 16, height 5\' 4"  (1.626 m), weight 81.7 kg, SpO2 100 %.  Gen: no distress, normal appearing HEENT: oral mucosa pink and moist, NCAT Cardio: Reg rate Chest: normal effort, normal rate of breathing Abd: soft, non-distended Ext: no edema Psych: pleasant, normal affect Skin: intact Musculoskeletal:     Comments: No edema or tenderness in extremities  Neurological:     alert. Follows basic commands. Moves all 4's. Still dysphonic. Fairly reasonable standing balance. Still impulsive. Improved attention  Assessment/Plan: 1. Functional deficits which require 3+ hours per day of interdisciplinary therapy in a comprehensive inpatient rehab setting.  Physiatrist is providing close team supervision and 24 hour management of active medical problems listed below.  Physiatrist and rehab team continue to assess barriers to discharge/monitor patient progress toward functional and medical goals  Care Tool:  Bathing        Body parts bathed by helper: Right arm,Left arm,Chest,Abdomen,Front perineal area,Buttocks,Right upper leg,Left upper leg,Right lower leg,Left lower leg,Face     Bathing assist Assist Level: Dependent - Patient  0%     Upper Body Dressing/Undressing Upper body dressing   What is the patient wearing?: Pull over shirt    Upper body assist Assist Level: Maximal Assistance - Patient 25 - 49%    Lower Body Dressing/Undressing Lower body dressing      What is the patient wearing?: Pants     Lower body assist Assist for lower body dressing: Maximal Assistance - Patient 25 - 49%     Toileting Toileting    Toileting assist Assist for toileting: 2 Helpers (2 helpers to turn pt in bed)     Transfers Chair/bed transfer  Transfers assist  Chair/bed transfer activity did not occur: Safety/medical concerns  Chair/bed transfer assist level: Minimal Assistance - Patient > 75%     Locomotion Ambulation   Ambulation assist   Ambulation activity did not occur: Safety/medical concerns  Assist level: Minimal Assistance - Patient > 75% Assistive device: No Device Max distance: 150'   Walk 10 feet activity   Assist  Walk 10 feet activity did not occur: Safety/medical concerns  Assist level: Minimal Assistance - Patient > 75% Assistive device: No Device   Walk 50 feet activity   Assist Walk 50 feet with 2 turns activity did not occur: Safety/medical concerns  Assist level: Minimal Assistance - Patient > 75% Assistive device: No Device    Walk 150 feet  activity   Assist Walk 150 feet activity did not occur: Safety/medical concerns  Assist level: Minimal Assistance - Patient > 75% Assistive device: No Device    Walk 10 feet on uneven surface  activity   Assist Walk 10 feet on uneven surfaces activity did not occur: Safety/medical concerns         Wheelchair     Assist Will patient use wheelchair at discharge?: No (Per PT long-term goals)             Wheelchair 50 feet with 2 turns activity    Assist            Wheelchair 150 feet activity     Assist          Blood pressure 108/69, pulse 80, temperature 97.8 F (36.6 C), temperature  source Oral, resp. rate 16, height 5\' 4"  (1.626 m), weight 81.7 kg, SpO2 100 %.  Medical Problem List and Plan: 1.  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 fib arrest/acute respiratory failure             -patient may not shower             -ELOS/Goals: 02/08/21 goals min/mod a         Continue CIR PT, OT, SLP 2.  Antithrombotics: -DVT/anticoagulation: Continue SCDs             -antiplatelet therapy: N/A 3. Pain Management:   -off oxycodone   3/9 tylenol alone for pain and doing well- last received tylenol 2/27 4. Mood: Demonstrates no anxiety or agitation, has been alert during the day, d/c Klonopin.               -antipsychotic agents: Continue Seroquel 25 mg nightly                         This medication is necessary. 5. Neuropsych: This patient is not capable of making decisions on her own behalf.  -Continue ritalin trial to help attention and initation  Continue ritalin  10mg  at 0700 and 1200--attention improving  6. Skin/Wound Care: Routine skin checks 7. Fluids/Electrolytes/Nutrition: TF changed to HS  -recheck labs Monday  8.  Seizure prophylaxis.  Keppra 1000 mg twice daily.  EEG negative 9.  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    - decannulated 3/2 without any problems. Stoma  closed 10.  Dysphagia with decreased nutritional storage.  Gastrostomy tube 12/13/2020 per Dr. Andrey Farmer.  .              -MBS 3/3--> D1/Thin liquids   3/4-change TF to nocturnal 11.  Aspiration pneumonia.  5-day course of Unasyn completed. 12.  Hypertension.  Well controlled, continue Norvasc 10 mg daily, Lopressor 25 mg twice daily.  Patient would follow-up cardiology services.             controlled 2/22  3/4- BP reasonably controlled 14.  Morbid obesity.  BMI 31.47.  Dietary follow-up 15.  Prediabetes.  Hemoglobin A1c 5.9.  CBGs into 200s: decrease TF rate to 70  3/9: CBGs better controlled- continue to monitor             Moderate increased 16. Tachycardia:  3/7 better  controlled with propranolol, but still elevated, increase propanolol to BID  3/9-3/11: much better controlled! Continue current regimen 17. Loose stool:  -improved off reglan---can be a little mushy at times 18. Low grade temp 3/3 x1. No further elevated readings 19. Anemia:  Hgb reviewed, down to 10.4 from 11.6 on 3/7, monitor weekly.   LOS: 21 days A FACE TO FACE EVALUATION WAS PERFORMED  Clide Deutscher Giulianna Rocha 01/31/2021, 8:52 AM

## 2021-01-31 NOTE — Progress Notes (Signed)
Speech Language Pathology Weekly Progress and Session Note  Patient Details  Name: [name] [name] MRN: [id number] Date of Birth: 1966/07/14  Beginning of progress report period: January 24, 2021 End of progress report period: January 31, 2021  Today's Date: 01/31/2021 SLP Individual Time: 1200-1300 SLP Individual Time Calculation (min): 60 min  Short Term Goals: Week 3: SLP Short Term Goal 1 (Week 3): Patient will consume current diet with minimal overt s/s of aspiration and Min A verbal cues for use of swallowing compensatory strategies. SLP Short Term Goal 1 - Progress (Week 3): Met SLP Short Term Goal 2 (Week 3): Patient will utilize an increased vocal intensity to achieve ~75% intelligibility at the phrase level with Mod A verbal and visual cues. SLP Short Term Goal 2 - Progress (Week 3): Met SLP Short Term Goal 3 (Week 3): Patient will demonstrate sustained attention to functional tasks for 5 minutes with Mod verbal cues for redirection. SLP Short Term Goal 3 - Progress (Week 3): Met SLP Short Term Goal 4 (Week 3): Patient will utilize external memory aids for orientaiton place and time with Mod A verbal and visual cues. SLP Short Term Goal 4 - Progress (Week 3): Met SLP Short Term Goal 5 (Week 3): Patient will demonstrate functional problem solving for basic and familiar tasks with Mod A verbal and visual cues. SLP Short Term Goal 5 - Progress (Week 3): Met    New Short Term Goals: Week 4: SLP Short Term Goal 1 (Week 4): STGs=LTGs due to ELOS  Weekly Progress Updates: Patient continues to make excellent gains and has met 5 of 5 STGs this reporting period. Currently, patient is consuming Dys. 2 textures with thin liquids with minimal overt s/s of aspiration and requires overall supervision level verbal cues for use of swallowing compensatory strategies. Patient is performing RMT exercises and demonstrates improved vocal intensity but continues to require overall Mod A for use of speech  intelligibility strategies at the sentence level. Patient also continues to require overall Mod A verbal and visual cues for attention, problem solving and recall with function further exacerbated by motor planning deficits. Patient and family education ongoing. Patient would benefit from continued skilled SLP intervention to maximize her cognitive, speech and swallow function prior to discharge.      Intensity: Minumum of 1-2 x/day, 30 to 90 minutes Frequency: 3 to 5 out of 7 days Duration/Length of Stay: 3/19 Treatment/Interventions: Dysphagia/aspiration precaution training;Therapeutic Activities;Environmental controls;Cueing hierarchy;Functional tasks;Patient/family education;Speech/Language facilitation;Cognitive remediation/compensation;Internal/external aids   Daily Session  Skilled Therapeutic Interventions: Skilled treatment session focused on dysphagia and cognitive goals. SLP facilitated session by providing skilled observation with lunch meal of Dys. 2 textures with thin liquids. Patient consumed meal without overt s/s of aspiration with supervision level verbal cues for use of swallowing compensatory strategies. Recommend patient continue current diet with full supervision. Throughout self-feeding, Mod A verbal and visual cues were needed for problem solving due to motor planning issues. Patient demonstrated improved breath support and overall efficiency with RMT devices, therefore, patient's IMST device was increased to 13cm H2O and her EMST device was increased to 17 cm H2O. Patient performed 25 repetitions of each with Mod verbal and visual cues due to motor planning. SLP also provided Max hand over hand assist for patient to donn mouthpiece on devices due to motor planning deficits. Overall, patient demonstrated improved vocal intensity and required Mod verbal cues for use of speech intelligibility strategies at the sentence level to achieve ~80% intelligibility. Patient left upright  in  recliner with alarm on and all needs within reach. Continue with current plan of care.      Pain No/Denies Pain   Therapy/Group: Individual Therapy  Foch Rosenwald 01/31/2021, 2:05 PM

## 2021-02-01 DIAGNOSIS — R7303 Prediabetes: Secondary | ICD-10-CM

## 2021-02-01 DIAGNOSIS — R1310 Dysphagia, unspecified: Secondary | ICD-10-CM

## 2021-02-01 DIAGNOSIS — R7309 Other abnormal glucose: Secondary | ICD-10-CM

## 2021-02-01 DIAGNOSIS — Z298 Encounter for other specified prophylactic measures: Secondary | ICD-10-CM

## 2021-02-01 DIAGNOSIS — R000 Tachycardia, unspecified: Secondary | ICD-10-CM

## 2021-02-01 LAB — GLUCOSE, CAPILLARY
Glucose-Capillary: 161 mg/dL — ABNORMAL HIGH (ref 70–99)
Glucose-Capillary: 159 mg/dL — ABNORMAL HIGH (ref 70–99)
Glucose-Capillary: 160 mg/dL — ABNORMAL HIGH (ref 70–99)
Glucose-Capillary: 126 mg/dL — ABNORMAL HIGH (ref 70–99)

## 2021-02-01 MED ORDER — LEVETIRACETAM 100 MG/ML PO SOLN
750.0000 mg | Freq: Two times a day (BID) | ORAL | Status: DC
  Administered 2021-02-01 – 2021-02-04 (×6): 750 mg
  Filled 2021-02-01 (×7): qty 10

## 2021-02-01 NOTE — Progress Notes (Signed)
Speech Language Pathology Daily Session Note  Patient Details  Name: [name] [name] MRN: [id number] Date of Birth: [birth year]  Today's Date: 02/01/2021 SLP Individual Time: 1000-1100 SLP Individual Time Calculation (min): 60 min  Short Term Goals: Week 4: SLP Short Term Goal 1 (Week 4): STGs=LTGs due to ELOS  Skilled Therapeutic Interventions:   Patient seen for skilled ST session focusing on speech-language goals. 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 She then performed sustained vowel phoneme for average increments of 10 seconds. She then performed vocal intensity exercise and although she could increase intensity of her voice, she would try to sustain this instead of increasing intensity further. Patient had difficulty telling SLP the names and ages of her children. When SLP compared what she was saying during today's visit to names written in her memory notebook, two of the names were incorrect and ages were all incorrect. She then completed photo describing task for action/verb photos. She perseverated on telling the mood of the person in the picture and needed cues to describe the actions. "She's not happy" "He's an older man, he's happy". She was able to describe at short phrase level with mod-maxA cues but was not consistent in performance. Patient continues to benefit from skilled SLP intervention to maximize cognitive-linguistic, speech goals prior to discharge. Pain Pain Assessment Pain Scale: 0-10 Pain Score: 0-No pain  Therapy/Group: Individual Therapy  Sonia Baller, MA, CCC-SLP Speech Therapy

## 2021-02-01 NOTE — Progress Notes (Signed)
Occupational Therapy Session Note  Patient Details  Name: [name] [name] MRN: [id number] Date of Birth: 06-Oct-1966  Today's Date: 02/01/2021 OT Individual Time:740  - 905 85 min      Short Term Goals: Week 3:  OT Short Term Goal 1 (Week 3): Patient will complete 1 step of toileting task OT Short Term Goal 2 (Week 3): Pt will locate 3 items on R side of sink with min verbal cues OT Short Term Goal 3 (Week 3): Pt will tolerate standing for 5 minutes in preparation for BADL task  Skilled Therapeutic Interventions/Progress Updates:    Pt received sitting EOB with her husband present. Both requesting to complete ADLs and pt agreeable to shower. RN entered to administer medications. Pt supervised EOB during med pass and she was able to consume crushed meds in applesauce and thin liquids via straw with no overt s/s of aspiration. Pt completed sit > stand with supervision from EOB. Ambulatory transfer into the bathroom with CGA. Pt required min cueing for sequencing turning to sit on TTB and to doff clothing. Min A overall to doff clothing. Pt completed UB bathing with min A to wash under LUE with the RUE d/t motor planning deficits. She benefited from visual cues as well as verbal during session. She returned to EOB and again required visual cue/demo to be able to apply deodorant, with apraxia evident. Shirt donned with CGA. Min A to don depends d/t inability to motor plan/recognize error in orientating hole to foot. Pt was able to don pants with only CGA, improved sequencing/motor planning. Pt was taken to the ADL apt where she completed functional kitchen task- sorting silverware- to address error recognition, sequencing, sustained attention, and motor planning. Pt was initially very successful but toward the end of the task had increased error, difficulty orientating silverware to fit in holes, and reduced awareness. Pt took a rest break and then completed table setting task- instructed to follow near  model of silverware placement onto napkin. Pt required mod-max cueing overall with again, difficulty motor planning, poor error recognition, and recognizing/following a pattern. Pt with great sustained attention to task and working hard throughout task. Pt returned to her room and completed toileting tasks with min A. She returned to supine in bed and was left with bed alarm set and all needs met.   Therapy Documentation Precautions:  Precautions Precautions: Fall,Other (comment) Precaution Comments: O2 via trach collar; PEG tube, delayed processing/initiation Restrictions Weight Bearing Restrictions: No RUE Weight Bearing: Non weight bearing  Therapy/Group: Individual Therapy  Curtis Sites 02/01/2021, 6:42 AM

## 2021-02-01 NOTE — Progress Notes (Signed)
PROGRESS NOTE   Subjective/Complaints: Patient seen sitting up in bed this morning being fed breakfast by family.  She states she slept well overnight.  No reported issues overnight.  ROS: Limited due to cognition, but appears to deny CP, shortness of breath, nausea, vomiting, diarrhea.  Objective:   No results found. No results for input(s): WBC, HGB, HCT, PLT in the last 72 hours. No results for input(s): NA, K, CL, CO2, GLUCOSE, BUN, CREATININE, CALCIUM in the last 72 hours.  Intake/Output Summary (Last 24 hours) at 02/01/2021 1135 Last data filed at 02/01/2021 0616 Gross per 24 hour  Intake 1368.17 ml  Output --  Net 1368.17 ml        Physical Exam: Vital Signs Blood pressure 117/69, pulse 88, temperature 98.8 F (37.1 C), resp. rate 18, height 5\' 4"  (1.626 m), weight 81.7 kg, SpO2 100 %.  Constitutional: No distress . Vital signs reviewed. HENT: Normocephalic.  Atraumatic. Eyes: EOMI. No discharge. Cardiovascular: No JVD.  RRR. Respiratory: Normal effort.  No stridor.  Bilateral clear to auscultation. GI: Non-distended.  BS +. Skin: Warm and dry.  Intact. Psych: Flat.  Slowed. Musc: No edema in extremities.  No tenderness in extremities. Neuro: Alert Dysphonia Motor:?  Limited due to slowing, >/3+/5 throughout  Assessment/Plan: 1. Functional deficits which require 3+ hours per day of interdisciplinary therapy in a comprehensive inpatient rehab setting.  Physiatrist is providing close team supervision and 24 hour management of active medical problems listed below.  Physiatrist and rehab team continue to assess barriers to discharge/monitor patient progress toward functional and medical goals  Care Tool:  Bathing        Body parts bathed by helper: Right arm,Left arm,Chest,Abdomen,Front perineal area,Buttocks,Right upper leg,Left upper leg,Right lower leg,Left lower leg,Face     Bathing assist Assist  Level: Dependent - Patient 0%     Upper Body Dressing/Undressing Upper body dressing   What is the patient wearing?: Pull over shirt    Upper body assist Assist Level: Maximal Assistance - Patient 25 - 49%    Lower Body Dressing/Undressing Lower body dressing      What is the patient wearing?: Pants     Lower body assist Assist for lower body dressing: Maximal Assistance - Patient 25 - 49%     Toileting Toileting    Toileting assist Assist for toileting: 2 Helpers (2 helpers to turn pt in bed)     Transfers Chair/bed transfer  Transfers assist  Chair/bed transfer activity did not occur: Safety/medical concerns  Chair/bed transfer assist level: Contact Guard/Touching assist     Locomotion Ambulation   Ambulation assist   Ambulation activity did not occur: Safety/medical concerns  Assist level: Contact Guard/Touching assist Assistive device: No Device Max distance: 150'   Walk 10 feet activity   Assist  Walk 10 feet activity did not occur: Safety/medical concerns  Assist level: Contact Guard/Touching assist Assistive device: No Device   Walk 50 feet activity   Assist Walk 50 feet with 2 turns activity did not occur: Safety/medical concerns  Assist level: Contact Guard/Touching assist Assistive device: No Device    Walk 150 feet activity   Assist Walk 150 feet activity  did not occur: Safety/medical concerns  Assist level: Contact Guard/Touching assist Assistive device: No Device    Walk 10 feet on uneven surface  activity   Assist Walk 10 feet on uneven surfaces activity did not occur: Safety/medical concerns         Wheelchair     Assist Will patient use wheelchair at discharge?: No (Per PT long-term goals)             Wheelchair 50 feet with 2 turns activity    Assist            Wheelchair 150 feet activity     Assist          Blood pressure 117/69, pulse 88, temperature 98.8 F (37.1 C), resp. rate  18, height 5\' 4"  (1.626 m), weight 81.7 kg, SpO2 100 %.  Medical Problem List and Plan: 1.  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  Antithrombotics: -DVT/anticoagulation: Continue SCDs             -antiplatelet therapy: N/A 3. Pain Management:   -off oxycodone   3/9 tylenol alone for pain 4. Mood: Demonstrates no anxiety or agitation, has been alert during the day, d/ced Klonopin.               -antipsychotic agents: Continue Seroquel 25 mg nightly                         This medication is necessary. 5. Neuropsych: This patient is not capable of making decisions on her own behalf.  Continue Ritalin 10mg  at 0700 and 1200--attention improving  6. Skin/Wound Care: Routine skin checks 7. Fluids/Electrolytes/Nutrition: TF changed to HS 8.  Seizure prophylaxis.  Keppra 1000 mg twice daily, decrease to 750 twice daily on 3/12-monitor for improvement in cognition  EEG negative 9.  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    - decannulated 3/2 without any problems. Stoma  closed 10.  Dysphagia with decreased nutritional storage.  Gastrostomy tube 12/13/2020 per Dr. Andrey Farmer.  .  D2 thins, advance diet as tolerated   3/4-change TF to nocturnal 11.  Aspiration pneumonia.  5-day course of Unasyn completed. 12.  Hypertension.  Continue Norvasc 10 mg daily, Lopressor 25 mg twice daily.  Patient would follow-up cardiology services.             controlled on 3/12 14.  Obesity.  BMI 31.47.  Dietary follow-up 15.    Tube feed induced hypoglycemia on prediabetes.  Hemoglobin A1c 5.9.    Labile on 3/12, monitor for trend             Moderate increased 16. Tachycardia:  Continue propranolol, increased propanolol to BID  Controlled on 3/12 17. Loose stool:  -improved off reglan---can be a little mushy at times 18. Anemia:   Hemoglobin 10.4 on 3/7, labs ordered for Monday  LOS: 22 days A FACE TO FACE EVALUATION WAS  PERFORMED  Berry Godsey Lorie Phenix 02/01/2021, 11:35 AM

## 2021-02-02 LAB — GLUCOSE, CAPILLARY
Glucose-Capillary: 138 mg/dL — ABNORMAL HIGH (ref 70–99)
Glucose-Capillary: 175 mg/dL — ABNORMAL HIGH (ref 70–99)
Glucose-Capillary: 177 mg/dL — ABNORMAL HIGH (ref 70–99)

## 2021-02-02 NOTE — Progress Notes (Signed)
Occupational Therapy Session Note  Patient Details  Name: [name] [name] MRN: [id number] Date of Birth: 1966/03/24  Today's Date: 02/02/2021 OT Group Time: 1105-1200 OT Group Time Calculation (min): 55 min  Skilled Therapeutic Interventions/Progress Updates:    Pt engaged in therapeutic w/c level dance group focusing on patient choice, UE/LE strengthening, salience, activity tolerance, and social participation. Pt was guided through various dance-based exercises involving UEs/LEs and trunk. All music was selected by group members. Emphasis placed on motor planning and dynamic standing balance. Pt attended group with her spouse. She stood to dance with spouse during multiple songs, spouse provided CGA with close supervision from either RT or OT. Pt affect was visibly bright as she danced around the circle. Note that she exhibited some difficultly with motor planning when given specific instruction. Spouse had hands on practice assisting pt with sitting back in her w/c safely due to her tendency to undershoot when sitting on transfer surface. At end of session pt was returned to room by spouse, RN also present to assist with donning safety belt.    Therapy Documentation Precautions:  Precautions Precautions: Fall,Other (comment) Precaution Comments: O2 via trach collar; PEG tube, delayed processing/initiation Restrictions Weight Bearing Restrictions: No RUE Weight Bearing: Non weight bearing Pain: no s/s pain during tx   ADL: ADL Eating: NPO Grooming: Dependent Where Assessed-Grooming: Edge of bed Upper Body Bathing: Dependent Where Assessed-Upper Body Bathing: Edge of bed Lower Body Bathing: Dependent Where Assessed-Lower Body Bathing: Bed level Upper Body Dressing: Dependent Where Assessed-Upper Body Dressing: Edge of bed Lower Body Dressing: Dependent Where Assessed-Lower Body Dressing: Bed level      Therapy/Group: Group Therapy  Mycal Conde A Jacorion Klem 02/02/2021, 3:25 PM

## 2021-02-03 LAB — CBC WITH DIFFERENTIAL/PLATELET
Abs Immature Granulocytes: 0.01 10*3/uL (ref 0.00–0.07)
Basophils Absolute: 0 10*3/uL (ref 0.0–0.1)
Basophils Relative: 1 %
Eosinophils Absolute: 0.4 10*3/uL (ref 0.0–0.5)
Eosinophils Relative: 7 %
HCT: 33.5 % — ABNORMAL LOW (ref 36.0–46.0)
Hemoglobin: 10.7 g/dL — ABNORMAL LOW (ref 12.0–15.0)
Immature Granulocytes: 0 %
Lymphocytes Relative: 34 %
Lymphs Abs: 2 10*3/uL (ref 0.7–4.0)
MCH: 28.6 pg (ref 26.0–34.0)
MCHC: 31.9 g/dL (ref 30.0–36.0)
MCV: 89.6 fL (ref 80.0–100.0)
Monocytes Absolute: 0.5 10*3/uL (ref 0.1–1.0)
Monocytes Relative: 9 %
Neutro Abs: 2.8 10*3/uL (ref 1.7–7.7)
Neutrophils Relative %: 49 %
Platelets: 181 10*3/uL (ref 150–400)
RBC: 3.74 MIL/uL — ABNORMAL LOW (ref 3.87–5.11)
RDW: 21 % — ABNORMAL HIGH (ref 11.5–15.5)
WBC: 5.8 10*3/uL (ref 4.0–10.5)
nRBC: 0 % (ref 0.0–0.2)

## 2021-02-03 LAB — GLUCOSE, CAPILLARY: Glucose-Capillary: 142 mg/dL — ABNORMAL HIGH (ref 70–99)

## 2021-02-03 MED ORDER — QUETIAPINE FUMARATE 25 MG PO TABS: 25.0000 mg | ORAL_TABLET | Freq: Every evening | ORAL | Status: DC | PRN

## 2021-02-03 MED ORDER — OSMOLITE 1.5 CAL PO LIQD
350.0000 mL | ORAL | Status: DC
  Administered 2021-02-03: 350 mL

## 2021-02-03 NOTE — Progress Notes (Signed)
Occupational Therapy Weekly Progress Note  Patient Details  Name: [name] [name] MRN: [id number] Date of Birth: 1966-08-28  Beginning of progress report period: January 11, 2021 End of progress report period: February 03, 2021  Today's Date: 02/03/2021 OT Individual Time: 2924-4628 OT Individual Time Calculation (min): 60 min    Patient has met 3 of 3 short term goals.  Patient is making excellent progress towards OT goals. Pt is at an overall supervision level for functional ambulation. Pt still needs cues for functional BADL tasks 2/2 apraxia, but apraxia is improving gradually. Continue current POC.  Patient continues to demonstrate the following deficits: muscle weakness, impaired timing and sequencing, unbalanced muscle activation, motor apraxia, ataxia, decreased coordination and decreased motor planning, decreased attention to right, decreased initiation, decreased attention, decreased awareness, decreased problem solving, decreased safety awareness, decreased memory and delayed processing and decreased standing balance, decreased postural control and decreased balance strategies and therefore will continue to benefit from skilled OT intervention to enhance overall performance with BADL and Reduce care partner burden.  Patient progressing toward long term goals..  Continue plan of care.  OT Short Term Goals Week 3:  OT Short Term Goal 1 (Week 3): Patient will complete 1 step of toileting task OT Short Term Goal 1 - Progress (Week 3): Met OT Short Term Goal 2 (Week 3): Pt will locate 3 items on R side of sink with min verbal cues OT Short Term Goal 2 - Progress (Week 3): Met OT Short Term Goal 3 (Week 3): Pt will tolerate standing for 5 minutes in preparation for BADL task OT Short Term Goal 3 - Progress (Week 3): Met Week 4:  OT Short Term Goal 1 (Week 4): LTG=STG 2/2 ELOS  Skilled Therapeutic Interventions/Progress Updates:    Pt greeted semi-reclined in bed, smiling, and eager to  participate in OT. Pt requesting to shower. Pt ambulated to the dresser with supervision. Pt needed demonstration to reach to the second drawer to get her clothes out. Pt then used  B UEs to collect clothing. Pt ambulated into bathroom with supervision and pt bathed sit<>stand from shower seat. Pt less apraxic during bathing tasks and followed 90% of commands to wash body parts thoroughly. Dressing from wc with OT placing head in shirt first, she was then able to thread B UEs. Pt continues to be very apraxic in LB dressing tasks and gets very confused with placing legs into pants. Once legs in pants, she is then able to initiate stand to pull up over hips. Standing grooming tasks with set-up A. Pt was able to spell out name of Christian music artist she wanted to listen to. Worked on standing balance and endurance as well as UB strength with standing, swaying, and arms overhead for praise and worship in meaningful activity to patient.   Therapy Documentation Precautions:  Precautions Precautions: Fall,Other (comment) Precaution Comments: O2 via trach collar; PEG tube, delayed processing/initiation Restrictions Weight Bearing Restrictions: No RUE Weight Bearing: Non weight bearing Pain:  denies pain  Therapy/Group: Individual Therapy  Valma Cava 02/03/2021, 9:14 AM

## 2021-02-03 NOTE — Progress Notes (Signed)
PROGRESS NOTE   Subjective/Complaints: Up in bed. Ready for therapy. Says she's feeling well. Denies pain.   ROS: Limited due to cognitive/behavioral    Objective:   No results found. Recent Labs    02/03/21 0458  WBC 5.8  HGB 10.7*  HCT 33.5*  PLT 181   No results for input(s): NA, K, CL, CO2, GLUCOSE, BUN, CREATININE, CALCIUM in the last 72 hours.  Intake/Output Summary (Last 24 hours) at 02/03/2021 1331 Last data filed at 02/03/2021 1242 Gross per 24 hour  Intake 1586.67 ml  Output -  Net 1586.67 ml        Physical Exam: Vital Signs Blood pressure 120/74, pulse 77, temperature 98.1 F (36.7 C), temperature source Oral, resp. rate 17, height 5\' 4"  (1.626 m), weight 80.5 kg, SpO2 100 %.  Constitutional: No distress . Vital signs reviewed. HEENT: EOMI, oral membranes moist Neck: supple Cardiovascular: RRR without murmur. No JVD    Respiratory/Chest: CTA Bilaterally without wheezes or rales. Normal effort    GI/Abdomen: BS +, non-tender, non-distended, PEG Ext: no clubbing, cyanosis, or edema Psych: pleasant and cooperative, a little restless Musc: No edema in extremities.  No tenderness in extremities. Neuro: Alert Dysphonia but can phonate somewhat when cued.  Motor4/5, sensory exam intact.   Assessment/Plan: 1. Functional deficits which require 3+ hours per day of interdisciplinary therapy in a comprehensive inpatient rehab setting.  Physiatrist is providing close team supervision and 24 hour management of active medical problems listed below.  Physiatrist and rehab team continue to assess barriers to discharge/monitor patient progress toward functional and medical goals  Care Tool:  Bathing        Body parts bathed by helper: Right arm,Left arm,Chest,Abdomen,Front perineal area,Buttocks,Right upper leg,Left upper leg,Right lower leg,Left lower leg,Face     Bathing assist Assist Level: Dependent -  Patient 0%     Upper Body Dressing/Undressing Upper body dressing   What is the patient wearing?: Pull over shirt    Upper body assist Assist Level: Maximal Assistance - Patient 25 - 49%    Lower Body Dressing/Undressing Lower body dressing      What is the patient wearing?: Pants     Lower body assist Assist for lower body dressing: Maximal Assistance - Patient 25 - 49%     Toileting Toileting    Toileting assist Assist for toileting: 2 Helpers (2 helpers to turn pt in bed)     Transfers Chair/bed transfer  Transfers assist  Chair/bed transfer activity did not occur: Safety/medical concerns  Chair/bed transfer assist level: Contact Guard/Touching assist     Locomotion Ambulation   Ambulation assist   Ambulation activity did not occur: Safety/medical concerns  Assist level: Contact Guard/Touching assist Assistive device: No Device Max distance: 150'   Walk 10 feet activity   Assist  Walk 10 feet activity did not occur: Safety/medical concerns  Assist level: Contact Guard/Touching assist Assistive device: No Device   Walk 50 feet activity   Assist Walk 50 feet with 2 turns activity did not occur: Safety/medical concerns  Assist level: Contact Guard/Touching assist Assistive device: No Device    Walk 150 feet activity   Assist Walk 150  feet activity did not occur: Safety/medical concerns  Assist level: Contact Guard/Touching assist Assistive device: No Device    Walk 10 feet on uneven surface  activity   Assist Walk 10 feet on uneven surfaces activity did not occur: Safety/medical concerns         Wheelchair     Assist Will patient use wheelchair at discharge?: No (Per PT long-term goals)             Wheelchair 50 feet with 2 turns activity    Assist            Wheelchair 150 feet activity     Assist          Blood pressure 120/74, pulse 77, temperature 98.1 F (36.7 C), temperature source Oral,  resp. rate 17, height 5\' 4"  (1.626 m), weight 80.5 kg, SpO2 100 %.  Medical Problem List and Plan: 1.  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  Antithrombotics: -DVT/anticoagulation: Continue SCDs             -antiplatelet therapy: N/A 3. Pain Management:   -off oxycodone   3/14 tylenol alone for pain 4. Mood: Demonstrates no anxiety or agitation, has been alert during the day, d/ced Klonopin.               -antipsychotic agents: Continue Seroquel 25 mg nightly---change to prn 3/14                         This medication is necessary. 5. Neuropsych: This patient is not capable of making decisions on her own behalf.  Continue Ritalin 10mg  at 0700 and 1200--attention better!  6. Skin/Wound Care: Routine skin checks 7. Fluids/Electrolytes/Nutrition: TF changed to HS 8.  Seizure prophylaxis.  Keppra 1000 mg twice daily, decreased to 750 twice daily on 3/12  -further decrease this week  EEG negative 9.  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    - decannulated 3/2 without any problems. Stoma  closed 10.  Dysphagia with decreased nutritional storage.  Gastrostomy tube 12/13/2020 per Dr. Andrey Farmer.  .  D2 thins, advance diet as tolerated   3/14-reduced TF to 35cc/hr to help stim appetite 11.  Aspiration pneumonia.  5-day course of Unasyn completed. 12.  Hypertension.  Continue Norvasc 10 mg daily, Lopressor 25 mg twice daily.  Patient would follow-up cardiology services.             controlled on 3/14 14.  Obesity.  BMI 31.47.  Dietary follow-up 15.    Tube feed induced hypoglycemia on prediabetes.  Hemoglobin A1c 5.9.    Improving 3/14. Eventually will be off TF 16. Tachycardia:  Continue propranolol, increased propanolol to BID  Controlled on 3/14 17. Loose stool:  -improved   18. Anemia:   Hemoglobin 10.4 on 3/7--> 10.7 3/14  LOS: 24 days A FACE TO FACE EVALUATION WAS PERFORMED  Meredith Staggers 02/03/2021, 1:31 PM

## 2021-02-03 NOTE — Progress Notes (Signed)
Speech Language Pathology Daily Session Note  Patient Details  Name: [name] [name] MRN: [id number] Date of Birth: 07/06/1966  Today's Date: 02/03/2021 SLP Individual Time: 0725-0820 SLP Individual Time Calculation (min): 55 min  Short Term Goals: Week 4: SLP Short Term Goal 1 (Week 4): STGs=LTGs due to ELOS  Skilled Therapeutic Interventions: Skilled treatment session focused on dysphagia and communication goals. SLP facilitated session by providing Min A verbal cues for use of swallowing compensatory strategies to decrease oral residue with breakfast meal of Dys. 2 textures with thin liquids. No overt s/s of aspiration noted. Recommend patient continue current diet. Patient performed 25 repetitions of RMT exercises with Min A verbal and visual cues for efficiency. Patient also participated in an informal conversation with Min verbal cues for use of speech intelligibility strategies to achieve ~90% intelligibility at the sentence level. Patient transferred back to bed at end of session. Continue with current plan of care.      Pain No/Denies Pain   Therapy/Group: Individual Therapy  Janaiya Beauchesne 02/03/2021, 8:23 AM

## 2021-02-03 NOTE — Progress Notes (Signed)
Physical Therapy Session Note  Patient Details  Name: [name] [name] MRN: [id number] Date of Birth: 02-27-1966  Today's Date: 02/03/2021 PT Individual Time: 0950-1100 PT Individual Time Calculation (min): 70 min   Short Term Goals: Week 3:  PT Short Term Goal 1 (Week 3): Pt will perform bed mobility consistently with minA. PT Short Term Goal 2 (Week 3): Pt will perform bed to chair transfer consistently with CGA. PT Short Term Goal 3 (Week 3): Pt will ambulate 300' with CGA and LRAD. PT Short Term Goal 4 (Week 3): Pt will improve Berg score by MCID.  Skilled Therapeutic Interventions/Progress Updates:     Patient in bed upon PT arrival. Patient alert and agreeable to PT session. Patient denied pain during session.  Therapeutic Activity: Bed Mobility: Patient performed supine to/from sit with mod I using hospital bed features.  Transfers: Patient performed sit to/from stand with supervision throughout session. Provided verbal cues for completing turn to sit back x2. Five times Sit to Stand Test (FTSS) Method: Use a straight back chair with a solid seat that is 16-18" high. Ask participant to sit on the chair with arms folded across their chest.   Instructions: "Stand up and sit down as quickly as possible 5 times, keeping your arms folded across your chest."   Measurement: Stop timing when the participant stands the 5th time.  TIME: __17.9____ (in seconds)  Times > 13.6 seconds is associated with increased disability and morbidity (Guralnik, 2000) Times > 15 seconds is predictive of recurrent falls in healthy individuals aged 45 and older (Buatois, et al., 2008) Normal performance values in community dwelling individuals aged 45 and older (Bohannon, 2006): o 60-69 years: 11.4 seconds o 70-79 years: 12.6 seconds o 80-89 years: 14.8 seconds  MCID: ? 2.3 seconds for Vestibular Disorders (Meretta, 2006)  Gait Training:  Patient ambulated >100 feet x2 without AD with CGA  progressing to close supervision. Ambulated with decreased gait speed, decreased visual scanning, and decreased arm swing and trunk rotation. Provided verbal cues for increased arm swing for improved balance and gait speed, and visual scanning with visual target. Patient performed an ambulatory transfer to the bathroom and lower body clothing managment with supervision and set-up assist for peri-care. Patient was continent of bowl and bladder during toileting.  6 Min Walk Test:  Instructed patient to ambulate as quickling and as safely as possible for 6 minutes using LRAD. Patient was allowed to take standing rest breaks without stopping the test, but if he required a sitting rest break the clock would be stopped and the test would be over.  Results: 1027 feet (313 meters), avg speed 0.87 m/s, with CGA-close supervision without AD, RPE 6/10 after.  Neuromuscular Re-ed: Patient performed the Berg Balance Test: Patient demonstrates increased fall risk as noted by score of 41/56 on Berg Balance Scale.  (<36= high risk for falls, close to 100%; 37-45 significant >80%; 46-51 moderate >50%; 52-55 lower >25%)  -weaving between 8 cones set 2 feet apart down and back x6 patient required max A for cues for technique throughout, progressed from missing >75% of the cones to missing 0% of the cones with improved speed and intermittent automatic progression to turn to weave between the next cone without cues -picking up 8 cones from the floor and stacking them, required max cues and facilitation to stack cones initially and stacked 2 without cues by the end  Patient in bed at end of session with breaks locked, bed and waist belt alarm  set, and all needs within reach.   Therapy Documentation Precautions:  Precautions Precautions: Fall,Other (comment) Precaution Comments: O2 via trach collar; PEG tube, delayed processing/initiation Restrictions Weight Bearing Restrictions: No RUE Weight Bearing: Non weight  bearing  Balance: Balance Balance Assessed: Yes Standardized Balance Assessment Standardized Balance Assessment: Berg Balance Test Berg Balance Test Sit to Stand: Able to stand without using hands and stabilize independently Standing Unsupported: Able to stand 2 minutes with supervision Sitting with Back Unsupported but Feet Supported on Floor or Stool: Able to sit safely and securely 2 minutes Stand to Sit: Sits safely with minimal use of hands Transfers: Able to transfer safely, minor use of hands Standing Unsupported with Eyes Closed: Able to stand 10 seconds with supervision Standing Ubsupported with Feet Together: Needs help to attain position but able to stand for 30 seconds with feet together From Standing, Reach Forward with Outstretched Arm: Can reach forward >5 cm safely (2") From Standing Position, Pick up Object from Floor: Able to pick up shoe, needs supervision From Standing Position, Turn to Look Behind Over each Shoulder: Looks behind from both sides and weight shifts well Turn 360 Degrees: Able to turn 360 degrees safely one side only in 4 seconds or less (4 sec to the R, 6 sec to the L due to distraction) Standing Unsupported, Alternately Place Feet on Step/Stool: Able to complete 4 steps without aid or supervision Standing Unsupported, One Foot in Front: Able to take small step independently and hold 30 seconds Standing on One Leg: Able to lift leg independently and hold equal to or more than 3 seconds Total Score: 41/56   Therapy/Group: Individual Therapy  Sunnie Odden L Sally-Anne Wamble PT, DPT  02/03/2021, 12:41 PM

## 2021-02-03 NOTE — Progress Notes (Signed)
[facility]) Hospital Liaison note:  This patient has been referred to our outpatient based palliative care services. [facility] will continue to follow for disposition.  Please call for any outpatient palliative care related questions or concerns.  Thank you,  Margaretmary Eddy, BSN, RN [facility] Liaison [phone]

## 2021-02-04 LAB — GLUCOSE, CAPILLARY
Glucose-Capillary: 140 mg/dL — ABNORMAL HIGH (ref 70–99)
Glucose-Capillary: 109 mg/dL — ABNORMAL HIGH (ref 70–99)

## 2021-02-04 MED ORDER — FREE WATER
50.0000 mL | Freq: Every day | Status: DC
  Administered 2021-02-05 – 2021-02-06 (×2): 50 mL

## 2021-02-04 MED ORDER — POTASSIUM CHLORIDE 20 MEQ PO PACK
20.0000 meq | PACK | Freq: Every day | ORAL | Status: DC
  Administered 2021-02-04 – 2021-02-07 (×4): 20 meq via ORAL
  Filled 2021-02-04 (×4): qty 1

## 2021-02-04 MED ORDER — POLYETHYLENE GLYCOL 3350 17 G PO PACK: 17.0000 g | PACK | Freq: Every day | ORAL | Status: DC | PRN

## 2021-02-04 MED ORDER — LEVETIRACETAM 750 MG PO TABS
750.0000 mg | ORAL_TABLET | Freq: Two times a day (BID) | ORAL | Status: DC
  Administered 2021-02-04 – 2021-02-08 (×7): 750 mg via ORAL
  Filled 2021-02-04 (×7): qty 1

## 2021-02-04 MED ORDER — ATORVASTATIN CALCIUM 80 MG PO TABS
80.0000 mg | ORAL_TABLET | Freq: Every day | ORAL | Status: DC
  Administered 2021-02-05 – 2021-02-08 (×3): 80 mg via ORAL
  Filled 2021-02-04 (×3): qty 1

## 2021-02-04 MED ORDER — METOPROLOL TARTRATE 25 MG PO TABS
25.0000 mg | ORAL_TABLET | Freq: Two times a day (BID) | ORAL | Status: DC
  Administered 2021-02-04 – 2021-02-08 (×7): 25 mg via ORAL
  Filled 2021-02-04 (×7): qty 1

## 2021-02-04 MED ORDER — SENNOSIDES-DOCUSATE SODIUM 8.6-50 MG PO TABS: 1.0000 | ORAL_TABLET | Freq: Every day | ORAL | Status: DC | PRN

## 2021-02-04 MED ORDER — AMLODIPINE BESYLATE 10 MG PO TABS
10.0000 mg | ORAL_TABLET | Freq: Every day | ORAL | Status: DC
  Administered 2021-02-05 – 2021-02-08 (×3): 10 mg via ORAL
  Filled 2021-02-04 (×3): qty 1

## 2021-02-04 MED ORDER — SACCHAROMYCES BOULARDII 250 MG PO CAPS
250.0000 mg | ORAL_CAPSULE | Freq: Two times a day (BID) | ORAL | Status: DC
  Administered 2021-02-04 – 2021-02-08 (×7): 250 mg via ORAL
  Filled 2021-02-04 (×7): qty 1

## 2021-02-04 MED ORDER — ACETAMINOPHEN 325 MG PO TABS: 650.0000 mg | ORAL_TABLET | Freq: Three times a day (TID) | ORAL | Status: DC | PRN

## 2021-02-04 MED ORDER — METHYLPHENIDATE HCL 5 MG PO TABS
10.0000 mg | ORAL_TABLET | Freq: Two times a day (BID) | ORAL | Status: DC
  Administered 2021-02-04 – 2021-02-06 (×5): 10 mg via ORAL
  Filled 2021-02-04 (×6): qty 2

## 2021-02-04 NOTE — Patient Care Conference (Signed)
Inpatient RehabilitationTeam Conference and Plan of Care Update Date: 02/04/2021   Time: 10:53 AM    Patient Name: [name] [name]      Medical Record Number: [id number]  Date of Birth: 02-Jul-1966 Sex: Female         Room/Bed: 4W13C/4W13C-01 Payor Info: Payor: MEDICAID PENDING / Plan: MEDICAID PENDING / Product Type: *No Product type* /    Admit Date/Time:  01/10/2021  2:23 PM  Primary Diagnosis:  Anoxic brain injury [facility])  Hospital Problems: Principal Problem:   Anoxic brain injury [facility]) Active Problems:   Malnutrition of moderate degree   Sinus tachycardia   Labile blood glucose   Dysphagia   Seizure prophylaxis    Expected Discharge Date: Expected Discharge Date: 02/08/21  Team Members Present: Physician leading conference: Dr. Alger Simons Care Coodinator Present: Loralee Pacas, LCSWA;Stacey Creig Hines, RN, BSN, [place] Nurse Present: Other (comment) Demetrios Loll, RN) PT Present: Tereasa Coop, PT OT Present: Cherylynn Ridges, OT SLP Present: Weston Anna, SLP PPS Coordinator present : Gunnar Fusi, SLP     Current Status/Progress Goal Weekly Team Focus  Bowel/Bladder   Pt is continent x2.  Pt will remain continent x2.  Assess q shift and prn.   Swallow/Nutrition/ Hydration   Dys. 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  Pain will remain <3.  Assess q shift and prn.   Skin   Pt has peg tube in place.  Skin will remain  infection free.  Assess q shift and prn.     Discharge Planning:      Team Discussion: Peg to hopefully be removed before discharge, labs look good, heart rate better controlled. Continent B/B, eats better for husband. Uninsured, discharging home with husband.  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 Contact guard for mobility. Walking 300 ft with supervision. Dys 2, thin liquids and will go home on this diet, RMT is helping with her speech. Family has been quizzing her a lot and she feels a lot of pressure from this. She can't remember some of the things they are asking and then she becomes disappointed in herself.  *See Care Plan and progress notes for long and short-term goals.   Revisions to Treatment Plan:  Not at this time.  Teaching Needs: Family education, medication management, pain management, skin/wound care, transfer training, gait training, balance training, endurance training, stair training, safety awareness.  Current Barriers to Discharge: Inaccessible home environment, Decreased caregiver support, Home enviroment access/layout, Wound care, Lack of/limited family support, Weight, Weight bearing restrictions, Medication compliance, Behavior and Nutritional means  Possible Resolutions to Barriers: Continue current medications, offer nutritional support, provide emotional support.     Medical Summary Current Status: improving cognition and awareness. trach out without issues. intake still inconsistent.  Barriers to Discharge: Medical stability   Possible Resolutions to Barriers/Weekly Focus: Daily assessment  of labs/pt data. PEG out this week?   Continued Need for Acute Rehabilitation Level of Care: The patient requires daily medical management by a physician with specialized training in physical medicine and rehabilitation for the following reasons: Direction of a multidisciplinary physical rehabilitation  program to maximize functional independence : Yes Medical management of patient stability for increased activity during participation in an intensive rehabilitation regime.: Yes Analysis of laboratory values and/or radiology reports with any subsequent need for medication adjustment and/or medical intervention. : Yes   I attest that I was present, lead the team conference, and concur with the assessment and plan of the team.   Cristi Loron 02/04/2021, 4:28 PM

## 2021-02-04 NOTE — Progress Notes (Signed)
Speech Language Pathology Daily Session Note  Patient Details  Name: [name] [name] MRN: [id number] Date of Birth: [birth year]  Today's Date: 02/04/2021 SLP Individual Time: 0915-1000 SLP Individual Time Calculation (min): 45 min  Short Term Goals: Week 4: SLP Short Term Goal 1 (Week 4): STGs=LTGs due to ELOS  Skilled Therapeutic Interventions: Skilled treatment session focused on cognitive goals. Patient performed 25 repetitions of RMT exercises with Min verbal cues for efficiency due to motor planning deficits. SLP administered the [facility] Mental Status Examination (SLUMS) and patient scored  10/30 points with a score of 27 or above considered normal. Patient with severe deficits in problem solving and recall. Patient aware of cognitive deficits and became tearful as she reports that her family tends to ask her biographical questions that she does not know the answers to and that tends to feel "pressure" regarding returning to "normal." SLP provided emotional support. Patient left upright in recliner with alarm on and all needs within reach. Continue with current plan of care.        Pain Pain Assessment Pain Scale: 0-10 Pain Score: 0-No pain Faces Pain Scale: No hurt  Therapy/Group: Individual Therapy  Kery Batzel 02/04/2021, 10:06 AM

## 2021-02-04 NOTE — Progress Notes (Signed)
Physical Therapy Weekly Progress Note  Patient Details  Name: [name] [name] MRN: [id number] Date of Birth: 07-22-1966  Beginning of progress report period: January 27, 2021 End of progress report period: February 04, 2021  Today's Date: 02/04/2021 PT Individual Time: 0915-1000 PT Individual Time Calculation (min): 45 min  and Today's Date: 02/04/2021 PT Missed Time: 15 Minutes Missed Time Reason:  (eating)  Patient has met 4 of 4 short term goals.  Pt is progressing very well toward mobility goals, improving independence and efficiency with all aspects of mobility. Pt awareness and carryover also improving. Pt is performing all mobility at CGA to minA level, with primary PT deficit being motor planning, affecting balance and safety. Pt will benefit from ongoing focus on ambulation, balance, motor planning, and discharge preparation.  Patient continues to demonstrate the following deficits muscle weakness, decreased cardiorespiratoy endurance, motor apraxia and decreased motor planning, decreased awareness, decreased problem solving and delayed processing and decreased standing balance and decreased balance strategies and therefore will continue to benefit from skilled PT intervention to increase functional independence with mobility.  Patient progressing toward long term goals..  Continue plan of care.  PT Short Term Goals Week 3:  PT Short Term Goal 1 (Week 3): Pt will perform bed mobility consistently with minA. PT Short Term Goal 1 - Progress (Week 3): Met PT Short Term Goal 2 (Week 3): Pt will perform bed to chair transfer consistently with CGA. PT Short Term Goal 2 - Progress (Week 3): Met PT Short Term Goal 3 (Week 3): Pt will ambulate 300' with CGA and LRAD. PT Short Term Goal 3 - Progress (Week 3): Met PT Short Term Goal 4 (Week 3): Pt will improve Berg score by MCID. PT Short Term Goal 4 - Progress (Week 3): Met Week 4:     Skilled Therapeutic Interventions/Progress Updates:      Pt misses 15 minutes of skilled PT due to trays arriving late and wanting to finish breakfast. Pt received seated at EOB and agreeable to therapy. No report of pain. Pt performs sit to stand multiple times during session with supervision for safety. Pt ambulates x150' to gym with cues for sequencing. Pt then cued to perform dynamic gait task of "weaving in and out of cones", with 5 cones placed 3' apart in straight line. PT demos task and has pt verbally repeat instructions. Pt requires max multimodal cueing to then perform task, attempting to walk into cones and appearing confused regarding instructions. PT then places orange tape in zig-zag pattern on floor, around cones, and tasks pt with ambulating along line. Pt shoes improvement in direction following and motor planning with line for external cue. PT then removes cones and has pt walk along line, and pt steadily improves performance until she is only requiring occasional minA to stay on task and rotate trunk. PT then places yellow and green cones in parallel lines, cueing pt to ambulate to "left of yellow cones and to the right of green cones". Pt has improved performance with this task relative to ambulating in and out of orange cones, but still requires mod to max multimodal cues to perform correctly. Pt left seated in recliner with alarm intact and all needs within reach.  Therapy Documentation Precautions:  Precautions Precautions: Fall,Other (comment) Precaution Comments: O2 via trach collar; PEG tube, delayed processing/initiation Restrictions Weight Bearing Restrictions: No RUE Weight Bearing: Non weight bearing  Therapy/Group: Individual Therapy  Breck Coons, PT, DPT 02/04/2021, 12:56 PM

## 2021-02-04 NOTE — Progress Notes (Signed)
Occupational Therapy Session Note  Patient Details  Name: [name] [name] MRN: [id number] Date of Birth: 1966-06-21  Today's Date: 02/04/2021 OT Individual Time: 1002-1030 OT Individual Time Calculation (min): 28 min   Short Term Goals: Week 4:  OT Short Term Goal 1 (Week 4): LTG=STG 2/2 ELOS  Skilled Therapeutic Interventions/Progress Updates:    Pt greeted sitting in reclined and agreeable to OT treatment session. Pt reported she needed to go to the bathroom. Pt ambulated into bathroom with supervision. Pt initiated pulling down pants and had continent void of urine. OT then cued pt to take pants off while seated on commode as pt wanted to change clothes. Pt got pants off of R leg, but then had difficulty motor planning taking pants off of L leg. Max multimodal cues to eventually get pants off. Pt completed peri-care with set-up A. Blocked practice for LB dressing from wc. OT placed R LE into pant leg first, then after multiple trials and multimodal cues, pt was able to thread her LLE today!! Pt then initiated stand to pull pants up with supervision. Continued working on apraxia within grooming tasks, donning deodorant, and brushing teeth. Pt still needs cues, but is correcting herself more frequently. OT placed shirt overhead, then pt was able to  Thread shirt sleeves without cues. Pt returned to recliner and left seated in recliner with alarm belt on, call bell in reach, and needs met.   Therapy Documentation Precautions:  Precautions Precautions: Fall,Other (comment) Precaution Comments: O2 via trach collar; PEG tube, delayed processing/initiation Restrictions Weight Bearing Restrictions: No RUE Weight Bearing: Non weight bearing Pain: Pain Assessment Pain Scale: 0-10 Pain Score: 0-No pain Faces Pain Scale: No hurt   Therapy/Group: Individual Therapy  Valma Cava 02/04/2021, 10:23 AM

## 2021-02-04 NOTE — Progress Notes (Signed)
Physical Therapy Session Note  Patient Details  Name: [name] [name] MRN: [id number] Date of Birth: 03/28/1966  Today's Date: 02/04/2021 PT Individual Time: 1400-1458 PT Individual Time Calculation (min): 58 min   Short Term Goals: Week 3:  PT Short Term Goal 1 (Week 3): Pt will perform bed mobility consistently with minA. PT Short Term Goal 1 - Progress (Week 3): Met PT Short Term Goal 2 (Week 3): Pt will perform bed to chair transfer consistently with CGA. PT Short Term Goal 2 - Progress (Week 3): Met PT Short Term Goal 3 (Week 3): Pt will ambulate 300' with CGA and LRAD. PT Short Term Goal 3 - Progress (Week 3): Met PT Short Term Goal 4 (Week 3): Pt will improve Berg score by MCID. PT Short Term Goal 4 - Progress (Week 3): Met  Skilled Therapeutic Interventions/Progress Updates:  Patient sidelying in bed upon PT arrival. Patient alert and agreeable to PT session. Patient denied pain during session.  Therapeutic Activity: Bed Mobility: Patient performed supine <> sit with supervision. Provided verbal cues for impulsivity as bed alarm was still engaged at start of session. VC for initiating return to sidelying at end of session.  Transfers: Patient performed STS, SPVT, and toilet transfers throughout session with supervision and no LOB or increase in sway. Provided verbal cues for decreasing impulsivity and increasing intentional thought/ mobility. During toileting, pt performs pericare with independence and places toilet paper in trash can and attempts to flush toilet using incorrect handle. Visual cue for correct handle.   Gait Training:  Patient ambulated indoor<>outdoor over uneven concrete covering >1000 feet and is able to manage all transitions, surfaces, and changes in gradient with supervision. Gait training required extra time as session included NMR of guidance of pt through problem solving of stating her goal destination and then guiding her in reading signs to problem solve  reaching front door and then returning to room. Pt is able to read words on signs but requires guidance and time to locate key words and then associate arrows in order to determine correct direction to go. Takes extensive time to reach destination and then return to room.   Patient sidelying in bed at end of session with brakes locked, bed alarm set, all rails raised, and all needs within reach.   Therapy Documentation Precautions:  Precautions Precautions: Fall,Other (comment) Precaution Comments: O2 via trach collar; PEG tube, delayed processing/initiation Restrictions Weight Bearing Restrictions: No RUE Weight Bearing: Non weight bearing  Therapy/Group: Individual Therapy   A  PT, DPT 02/04/2021, 6:41 PM  

## 2021-02-04 NOTE — Progress Notes (Signed)
Pt declining PRAFO boots tonight.

## 2021-02-04 NOTE — Progress Notes (Signed)
PROGRESS NOTE   Subjective/Complaints: No new issues today. Up with therapy early. PO intake up and down. Better with husband.   ROS: Patient denies fever, rash, sore throat, blurred vision, nausea, vomiting, diarrhea, cough, shortness of breath or chest pain, joint or back pain, headache   Objective:   No results found. Recent Labs    02/03/21 0458  WBC 5.8  HGB 10.7*  HCT 33.5*  PLT 181   No results for input(s): NA, K, CL, CO2, GLUCOSE, BUN, CREATININE, CALCIUM in the last 72 hours.  Intake/Output Summary (Last 24 hours) at 02/04/2021 1150 Last data filed at 02/04/2021 1007 Gross per 24 hour  Intake 470 ml  Output -  Net 470 ml        Physical Exam: Vital Signs Blood pressure 112/75, pulse 81, temperature 97.9 F (36.6 C), resp. rate 18, height 5\' 4"  (1.626 m), weight 80 kg, SpO2 99 %.  Constitutional: No distress . Vital signs reviewed. HEENT: EOMI, oral membranes moist Neck: supple Cardiovascular: RRR without murmur. No JVD    Respiratory/Chest: CTA Bilaterally without wheezes or rales. Normal effort    GI/Abdomen: BS +, non-tender, non-distended Ext: no clubbing, cyanosis, or edema Psych: pleasant and cooperative, sl distracted Musc: No edema in extremities.  No tenderness in extremities. Neuro: Alert Dysphonic still.  Motor4-5/5, sensory exam intact.   Assessment/Plan: 1. Functional deficits which require 3+ hours per day of interdisciplinary therapy in a comprehensive inpatient rehab setting.  Physiatrist is providing close team supervision and 24 hour management of active medical problems listed below.  Physiatrist and rehab team continue to assess barriers to discharge/monitor patient progress toward functional and medical goals  Care Tool:  Bathing        Body parts bathed by helper: Right arm,Left arm,Chest,Abdomen,Front perineal area,Buttocks,Right upper leg,Left upper leg,Right lower  leg,Left lower leg,Face     Bathing assist Assist Level: Dependent - Patient 0%     Upper Body Dressing/Undressing Upper body dressing   What is the patient wearing?: Pull over shirt    Upper body assist Assist Level: Maximal Assistance - Patient 25 - 49%    Lower Body Dressing/Undressing Lower body dressing      What is the patient wearing?: Pants     Lower body assist Assist for lower body dressing: Maximal Assistance - Patient 25 - 49%     Toileting Toileting    Toileting assist Assist for toileting: 2 Helpers (2 helpers to turn pt in bed)     Transfers Chair/bed transfer  Transfers assist  Chair/bed transfer activity did not occur: Safety/medical concerns  Chair/bed transfer assist level: Supervision/Verbal cueing     Locomotion Ambulation   Ambulation assist   Ambulation activity did not occur: Safety/medical concerns  Assist level: Contact Guard/Touching assist Assistive device: No Device Max distance: 1027 ft   Walk 10 feet activity   Assist  Walk 10 feet activity did not occur: Safety/medical concerns  Assist level: Contact Guard/Touching assist Assistive device: No Device   Walk 50 feet activity   Assist Walk 50 feet with 2 turns activity did not occur: Safety/medical concerns  Assist level: Contact Guard/Touching assist Assistive device: No Device  Walk 150 feet activity   Assist Walk 150 feet activity did not occur: Safety/medical concerns  Assist level: Contact Guard/Touching assist Assistive device: No Device    Walk 10 feet on uneven surface  activity   Assist Walk 10 feet on uneven surfaces activity did not occur: Safety/medical concerns         Wheelchair     Assist Will patient use wheelchair at discharge?: No (Per PT long-term goals)             Wheelchair 50 feet with 2 turns activity    Assist            Wheelchair 150 feet activity     Assist          Blood pressure 112/75,  pulse 81, temperature 97.9 F (36.6 C), resp. rate 18, height 5\' 4"  (1.626 m), weight 80 kg, SpO2 99 %.  Medical Problem List and Plan: 1.  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  Antithrombotics: -DVT/anticoagulation: Continue SCDs             -antiplatelet therapy: N/A 3. Pain Management:   -off oxycodone   3/15 tylenol alone for pain 4. Mood: Demonstrates no anxiety or agitation, has been alert during the day, d/ced Klonopin.               -antipsychotic agents: Continue Seroquel 25 mg nightly---change to prn 3/14                         This medication is necessary. 5. Neuropsych: This patient is not capable of making decisions on her own behalf.  Continue Ritalin 10mg  at 0700 and 1200--attention better!  6. Skin/Wound Care: Routine skin checks 7. Fluids/Electrolytes/Nutrition: TF changed to HS 8.  Seizure prophylaxis.  Keppra 1000 mg twice daily, decreased to 750 twice daily on 3/12  -further decrease prior to discharge  EEG negative 9.  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    - decannulated 3/2 without any problems. Stoma  closed 10.  Dysphagia with decreased nutritional storage.  Gastrostomy tube 12/13/2020 per Dr. Andrey Farmer.  .  D2 thins, advance diet as tolerated   3/14-reduced TF to 35cc/hr to help stim appetite   3/15-dc TF to stimulate appetite    -potentially remove PEG this week 11.  Aspiration pneumonia.  5-day course of Unasyn completed. 12.  Hypertension.  Continue Norvasc 10 mg daily, Lopressor 25 mg twice daily.  Patient would follow-up cardiology services.             controlled on 3/15 14.  Obesity.  BMI 31.47.  Dietary follow-up 15.    Tube feed induced hypoglycemia on prediabetes.  Hemoglobin A1c 5.9.    Improving 3/15. Now off TF 16. Tachycardia:  Continue propranolol, increased propanolol to BID  HR controlled. Also on lopressor   -dc propranolol   -can  adjust lopressor if needed 17. Loose stool:  -improved   18. Anemia:   Hemoglobin 10.4 on 3/7--> 10.7 3/14  LOS: 25 days A FACE TO [place] 02/04/2021, 11:50 AM

## 2021-02-05 LAB — GLUCOSE, CAPILLARY
Glucose-Capillary: 118 mg/dL — ABNORMAL HIGH (ref 70–99)
Glucose-Capillary: 104 mg/dL — ABNORMAL HIGH (ref 70–99)
Glucose-Capillary: 103 mg/dL — ABNORMAL HIGH (ref 70–99)

## 2021-02-05 NOTE — Progress Notes (Signed)
Physical Therapy Session Note  Patient Details  Name: [name] [name] MRN: [id number] Date of Birth: 1966-07-08  Today's Date: 02/05/2021 PT Individual Time: 0900-0956 PT Individual Time Calculation (min): 56 min   Short Term Goals: Week 3:  PT Short Term Goal 1 (Week 3): Pt will perform bed mobility consistently with minA. PT Short Term Goal 1 - Progress (Week 3): Met PT Short Term Goal 2 (Week 3): Pt will perform bed to chair transfer consistently with CGA. PT Short Term Goal 2 - Progress (Week 3): Met PT Short Term Goal 3 (Week 3): Pt will ambulate 300' with CGA and LRAD. PT Short Term Goal 3 - Progress (Week 3): Met PT Short Term Goal 4 (Week 3): Pt will improve Berg score by MCID. PT Short Term Goal 4 - Progress (Week 3): Met  Skilled Therapeutic Interventions/Progress Updates:    Pt received sitting in recliner with safety belt alarm on, agreeable to therapy. No reports of pain. Sit<>stand with supervision and no AD and ambulated to day room gym, ~181f, with supervision and no AD. Instructed on rules of TUG and COG TUG and she completed both x3 trials each. See results below. Difference of 7.2 seconds b/w them. COG task during TUG included serial casting from 100 by 1's. While doing trials of TUG - she also struggled with rules and with sitting correctly in her chair - for example, she would attempt to pick up the cone rather than walk around it and she would sit on the side of the chair instead of forward facing. She then completed NMR with BITS system while standing unsupported on level surfaces - completed Geoboard and Trail making task. With Geoboard duplication (no memory component), she required max cues for completing but due to tremors in hands and poor FMC, accuracy was limited. She performed better with trail making task but still required mod/max cues for sequencing from #1-#25 (129 errors made and 46 interruptions). In ADL apartment kitchen, instructed her to locate x3 items  (Apple, Jello, and Measuring cup). She was able to recall 2/3 items and able to find 1/3 items with supervision but needed max cues for the remaining 2 items. Completed session with obstacle course of cones (spaced ~227fapart) and instructed her on weaving technique - motor planning limiting this and she would instead just walk over the cones despite max cues. She then was instructed to pick up the cones which she did with CGA. She ended session seated in recliner with safety belt alarm on and needs within reach. She ambulated throughout session with supervision and no AD.   Therapy Documentation Precautions:  Precautions Precautions: Fall,Other (comment) Precaution Comments: O2 via trach collar; PEG tube, delayed processing/initiation Restrictions Weight Bearing Restrictions: No RUE Weight Bearing: Non weight bearing  Balance: Balance Balance Assessed: Yes Standardized Balance Assessment Standardized Balance Assessment: Timed Up and Go Test Timed Up and Go Test TUG: Normal TUG;Cognitive TUG Normal TUG (seconds): 14 Cognitive TUG (seconds): 21.2   Therapy/Group: Individual Therapy  Christian P Manhard PT 02/05/2021, 9:48 AM

## 2021-02-05 NOTE — Progress Notes (Signed)
Physical Therapy Session Note  Patient Details  Name: [name] [name] MRN: [id number] Date of Birth: 1966/10/07  Today's Date: 02/05/2021 PT Individual Time: 1300-1400 PT Individual Time Calculation (min): 60 min   Short Term Goals: Week 1:  PT Short Term Goal 1 (Week 1): Pt will perform supine<>sit with mod assist of 1 consistently PT Short Term Goal 1 - Progress (Week 1): Met PT Short Term Goal 2 (Week 1): Pt will perform sit<>stands using LRAD with +2 max assist PT Short Term Goal 2 - Progress (Week 1): Met PT Short Term Goal 3 (Week 1): Pt will perform bed<>chair transfers using LRAD with +2 max assist PT Short Term Goal 3 - Progress (Week 1): Met PT Short Term Goal 4 (Week 1): Pt will tolerate standing at least 30 seconds with +2 max assist using LRAD PT Short Term Goal 4 - Progress (Week 1): Met Week 2:  PT Short Term Goal 1 (Week 2): Pt will complete bed mobility with minA consistently. PT Short Term Goal 1 - Progress (Week 2): Progressing toward goal PT Short Term Goal 2 (Week 2): Pt will complete bed to chair transfer with minA consistently. PT Short Term Goal 2 - Progress (Week 2): Met PT Short Term Goal 3 (Week 2): Pt will ambulates 100' with minA and LRAD. PT Short Term Goal 3 - Progress (Week 2): Met PT Short Term Goal 4 (Week 2): Pt will perform Berg Balance assessment. PT Short Term Goal 4 - Progress (Week 2): Met Week 3:  PT Short Term Goal 1 (Week 3): Pt will perform bed mobility consistently with minA. PT Short Term Goal 1 - Progress (Week 3): Met PT Short Term Goal 2 (Week 3): Pt will perform bed to chair transfer consistently with CGA. PT Short Term Goal 2 - Progress (Week 3): Met PT Short Term Goal 3 (Week 3): Pt will ambulate 300' with CGA and LRAD. PT Short Term Goal 3 - Progress (Week 3): Met PT Short Term Goal 4 (Week 3): Pt will improve Berg score by MCID. PT Short Term Goal 4 - Progress (Week 3): Met  Skilled Therapeutic Interventions/Progress Updates:   Pain:  Pt reports no pain.  Treatment to tolerance.  Rest breaks and repositioning as needed.  Pt initially supine and agreeable to treatment session w/focus on dynamic balance. Pt attempts to sit before therapist lowers rail.  Supine to sit w/supervision.  Dons shoes w/max cues for sequencing, apraxic w/task.  Gait 168f w/supervision.  Dynamic balance: Practiced stepping over varios width/depth/ht obstacles.  Some difficulty noted w/placement/coordination RLE, min assist for safety.   Therapist educated pt on use of maxi ski and harness donned by therapist.  Pt set up w/harness/ski in sitting/standing then performed all of the following dynamic activities to address balance, coordination, global strengthening/endurance:  Lateral lunges at progrssively increased width and speed. Reverse lunges "              " Floor 1/2 clock w/6in cones - increased difficulty w/accuracy to target using RLE, decreased balance using LLE/balanceing on R.  W/all activities, pt able to tolerate increased rate of movement, cga to occasional min assist.  Standing ball toss for dual UE task + balance challenge, coordinated use w/both UEs, . Standing ball kick - performs well when kicking w/RLE, increased difficulty kicking w/L/balancing R.  Pt assisted w/removing maxiski harness at end of session. Gait >1563fback to room.  Transfers to recliner w/cga.   Pt left oob in recliner w/chair  alarm set and needs in reach.    Therapy Documentation Precautions:  Precautions Precautions: Fall,Other (comment) Precaution Comments: O2 via trach collar; PEG tube, delayed processing/initiation Restrictions Weight Bearing Restrictions: No RUE Weight Bearing: Non weight bearing    Therapy/Group: Individual Therapy  Callie Fielding, [place] 02/05/2021, 3:17 PM

## 2021-02-05 NOTE — Plan of Care (Signed)
  Problem: RH SAFETY Goal: RH STG ADHERE TO SAFETY PRECAUTIONS W/ASSISTANCE/DEVICE Description: STG Adhere to Safety Precautions With Assistance/Device. Mod assist Outcome: Not Progressing; telesitter

## 2021-02-05 NOTE — Evaluation (Signed)
Recreational Therapy Assessment and Plan  Patient Details  Name: [name] [name] MRN: [id number] Date of Birth: [birth year] Today's Date: 02/05/2021  Rehab Potential:  Good ELOS:   d/c 3/19 Assessment   Hospital Problem: Principal Problem:   Anoxic brain injury [facility])   Past Medical History:      Past Medical History:  Diagnosis Date  . Hypertension    Past Surgical History:       Past Surgical History:  Procedure Laterality Date  . PEG PLACEMENT N/A 12/13/2020   Procedure: PERCUTANEOUS ENDOSCOPIC GASTROSTOMY (PEG) PLACEMENT;  Surgeon: Lesly Rubenstein, MD;  Location: [facility] ENDOSCOPY;  Service: Endoscopy;  Laterality: N/A;  . TRACHEOSTOMY TUBE PLACEMENT N/A 12/13/2020   Procedure: TRACHEOSTOMY;  Surgeon: Clyde Canterbury, MD;  Location: [facility] ORS;  Service: ENT;  Laterality: N/A;    Assessment & Plan Clinical Impression: Patient is a 55 y.o. right-handed female with history of obesity with BMI 31.47, hypertension on no medications prior to admission as well as remote history of tobacco use. History taken from chart review due to cognition. Patient lives with spouse and her children. Reportedly independent prior to admission. Her children can help as needed. Presented to [facility] on 12/02/2020 with witnessed cardiac arrest. Per EMS she received 2 shocks by FD prior to EMS arrival. After EMS arrived she was noted to be in ventricular fibrillation. She did receive 1 round of epi. Total downtime was unknown. On arrival to the ED her GCS was 3. She required intubation for airway protection. Admission chemistries potassium 2.1, CO2 20, glucose 357, creatinine 1.01, AST 138, ALT 120, hemoglobin 9.7, WBC 11,900, hemoglobin A1c 5.9, troponin 71 and peaked at 900, lactic acid 10 urinalysis negative nitrite. Code sepsis was initiated placed on ceftriaxone. CT/MRI unremarkable for acute intracranial process.. Chest x-ray with no edema or airspace opacity. She was completing a course of  Unasyn for aspiration pneumonia. EEG negative for seizures, however it was suggestive of severe diffuse encephalopathy and patient remains on Keppra for seizure prophylaxis.. Echocardiogram with ejection fraction of 60-65%, no wall motion abnormalities and no signs of right heart strain. Palliative care was consulted to establish goals of care. Hospital course tracheostomy 12/13/2020 per Dr. Clyde Canterbury as well as placement of gastrostomy tube for nutritional support on 12/13/2020 per Dr. Andrey Farmer and currently remains n.p.o. Cardiology services follow-up for ischemic evaluation post V. fib arrest Dr. Ida Rogue and no current plan for invasive ischemic evaluation at this time given suboptimal candidate in the setting of current comorbidity conditions... Neurology follow-up for suspect anoxic brain injury with initial CT MRI unremarkable it was repeated 12/23/2020 findings consistent with anoxic brain injury. 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 Evolutionary changes likely related to evolving ischemia compared with prior MRI of 12/07/2020. Therapy evaluations completed due to patient's cognitive deficits and decreased functional mobility was admitted for a comprehensive rehab program.  Patient transferred to CIR on 01/10/2021 .   Pt presents with decreased activity tolerance, decreased functional mobility, decreased balance, motor apraxia, decreased coordination, decreased vision, decreased initiation, decreased attention, decreased awareness, decreased problem solving, decreased safety, decreased memory and delayed processing Limiting pt's independence with leisure/community pursuits.   Plan  Mi 1 TR session >20 minutes during LOS  Recommendations for other services: Neuropsych  Discharge Criteria: Patient will be discharged from TR if patient refuses treatment 3 consecutive times without medical reason.  If treatment goals not met, if  there is a change in  medical status, if patient makes no progress towards goals or if patient is discharged from hospital.  The above assessment, treatment plan, treatment alternatives and goals were discussed and mutually agreed upon: by patient    Pt requested to create a mask provided by [facility] to share her feelings with her family.  Utilized examples, word lists, written/verbal instruction to assist pt in creating a plan for mask creation.  Pt required min cues for expression at word/phrase level and additional time to communicate her thoughts.  Pt tearful at times when describing her feelings.  Pt appreciative of this session and excited to create her mask.  [name] [name] 02/05/2021, 3:53 PM

## 2021-02-05 NOTE — Progress Notes (Signed)
PROGRESS NOTE   Subjective/Complaints: Pt feeling well. Up with SLP eating breakfast.   ROS: Patient denies fever, rash, sore throat, blurred vision, nausea, vomiting, diarrhea, cough, shortness of breath or chest pain, joint or back pain, headache, or mood change.   Objective:   No results found. Recent Labs    02/03/21 0458  WBC 5.8  HGB 10.7*  HCT 33.5*  PLT 181   No results for input(s): NA, K, CL, CO2, GLUCOSE, BUN, CREATININE, CALCIUM in the last 72 hours.  Intake/Output Summary (Last 24 hours) at 02/05/2021 0847 Last data filed at 02/05/2021 0700 Gross per 24 hour  Intake 360 ml  Output -  Net 360 ml        Physical Exam: Vital Signs Blood pressure 116/69, pulse 84, temperature 98.3 F (36.8 C), temperature source Oral, resp. rate 14, height 5\' 4"  (1.626 m), weight 80 kg, SpO2 99 %.  Constitutional: No distress . Vital signs reviewed. HEENT: EOMI, oral membranes moist Neck: supple Cardiovascular: RRR without murmur. No JVD    Respiratory/Chest: CTA Bilaterally without wheezes or rales. Normal effort    GI/Abdomen: BS +, non-tender, non-distended Ext: no clubbing, cyanosis, or edema Psych: pleasant and cooperative Musc: No edema in extremities.  No tenderness in extremities. Neuro: Alert Dysphonic still but improving, some dysarthria. Improving insight and awareness. .  Motor4-5/5, sensory exam intact.   Assessment/Plan: 1. Functional deficits which require 3+ hours per day of interdisciplinary therapy in a comprehensive inpatient rehab setting.  Physiatrist is providing close team supervision and 24 hour management of active medical problems listed below.  Physiatrist and rehab team continue to assess barriers to discharge/monitor patient progress toward functional and medical goals  Care Tool:  Bathing        Body parts bathed by helper: Right arm,Left arm,Chest,Abdomen,Front perineal  area,Buttocks,Right upper leg,Left upper leg,Right lower leg,Left lower leg,Face     Bathing assist Assist Level: Dependent - Patient 0%     Upper Body Dressing/Undressing Upper body dressing   What is the patient wearing?: Pull over shirt    Upper body assist Assist Level: Maximal Assistance - Patient 25 - 49%    Lower Body Dressing/Undressing Lower body dressing      What is the patient wearing?: Pants     Lower body assist Assist for lower body dressing: Maximal Assistance - Patient 25 - 49%     Toileting Toileting    Toileting assist Assist for toileting: 2 Helpers (2 helpers to turn pt in bed)     Transfers Chair/bed transfer  Transfers assist  Chair/bed transfer activity did not occur: Safety/medical concerns  Chair/bed transfer assist level: Supervision/Verbal cueing     Locomotion Ambulation   Ambulation assist   Ambulation activity did not occur: Safety/medical concerns  Assist level: Supervision/Verbal cueing Assistive device: No Device Max distance: 150'   Walk 10 feet activity   Assist  Walk 10 feet activity did not occur: Safety/medical concerns  Assist level: Supervision/Verbal cueing Assistive device: No Device   Walk 50 feet activity   Assist Walk 50 feet with 2 turns activity did not occur: Safety/medical concerns  Assist level: Supervision/Verbal cueing Assistive device: No  Device    Walk 150 feet activity   Assist Walk 150 feet activity did not occur: Safety/medical concerns  Assist level: Supervision/Verbal cueing Assistive device: No Device    Walk 10 feet on uneven surface  activity   Assist Walk 10 feet on uneven surfaces activity did not occur: Safety/medical concerns         Wheelchair     Assist Will patient use wheelchair at discharge?: No (Per PT long-term goals)             Wheelchair 50 feet with 2 turns activity    Assist            Wheelchair 150 feet activity      Assist          Blood pressure 116/69, pulse 84, temperature 98.3 F (36.8 C), temperature source Oral, resp. rate 14, height 5\' 4"  (1.626 m), weight 80 kg, SpO2 99 %.  Medical Problem List and Plan: 1.  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  Antithrombotics: -DVT/anticoagulation: Continue SCDs             -antiplatelet therapy: N/A 3. Pain Management:   -off oxycodone   3/15 tylenol alone for pain 4. Mood: Demonstrates no anxiety or agitation, has been alert during the day, d/ced Klonopin.               -antipsychotic agents: Continue Seroquel 25 mg nightly---change to prn 3/14                         This medication is necessary. 5. Neuropsych: This patient is not capable of making decisions on her own behalf.  reduce Ritalin to 5mg  at 0700 and 1200   6. Skin/Wound Care: Routine skin checks 7. Fluids/Electrolytes/Nutrition: TF changed to HS 8.  Seizure prophylaxis.  Keppra 1000 mg twice daily, decreased to 750 twice daily on 3/12  -decrease to 500mg  bid today  EEG negative 9.  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    - decannulated 3/2 without any problems. Stoma  closed 10.  Dysphagia with decreased nutritional storage.  Gastrostomy tube 12/13/2020 per Dr. Andrey Farmer.  .  D2 thins, advance diet as tolerated   3/14-reduced TF to 35cc/hr to help stim appetite   3/15-dc'edTF to stimulate appetite   3/16-- discussed with pt re: need to increase po    -will make npo after midnight for PEG out tomorrow 11.  Aspiration pneumonia.  5-day course of Unasyn completed. 12.  Hypertension.  Continue Norvasc 10 mg daily, Lopressor 25 mg twice daily.  Patient would follow-up cardiology services.             controlled on 3/16 14.  Obesity.  BMI 31.47.  Dietary follow-up 15.    Tube feed induced hypoglycemia on prediabetes.  Hemoglobin A1c 5.9.    Improving 3/15. Now off TF 16.  Tachycardia:  Controlled on lopressor alone right onw 17. Loose stool:  -improved   18. Anemia:   Hemoglobin 10.4 on 3/7--> 10.7 3/14  LOS: 26 days A FACE TO [place] 02/05/2021, 8:47 AM

## 2021-02-05 NOTE — Progress Notes (Signed)
Nutrition Follow-up  DOCUMENTATION CODES:   Non-severe (moderate) malnutrition in context of acute illness/injury  INTERVENTION:   - Continue MVI with minerals daily  - Continue ProSource Plus 30 ml po BID, each supplement provides 100 kcal and 15 grams of protein  -Continue Ensure Enlive po BID, each supplement provides 350 kcal and 20 grams of protein  -Continue MagicCupBID withlunch and dinnermeals, each supplement provides 290 kcal and 9 grams of protein  NUTRITION DIAGNOSIS:   Moderate Malnutrition related to acute illness (cardiac arrest with resulting anoxic brain injury and dysphagia) as evidenced by mild fat depletion,mild muscle depletion,percent weight loss (9.5% weight loss in less than 2 months).  Ongoing, being addressed via oral nutrition supplements  GOAL:   Patient will meet greater than or equal to 90% of their needs  Progressing  MONITOR:   Diet advancement,Labs,Weight trends,TF tolerance,I & O's  REASON FOR ASSESSMENT:   Consult Enteral/tube feeding initiation and management,Assessment of nutrition requirement/status  ASSESSMENT:   55 year old female with PMH of HTN. Presented to [facility] on 12/02/20 with witnessed cardiac arrest. Pt required intubation for airway protection. Pt underwent tracheostomy on 12/13/20 as well as placement of G-tube. Pt remains NPO. Imaging studies consistent with anoxic brain injury. Admitted to CIR on 01/10/21.  3/02 - decannulated 3/03 - s/p MBS, diet advanced to dysphagia 1 with thin liquids 3/10 - diet advanced to dysphagia 2 3/15 - tube feeds discontinued  Noted plan for pt to have PEG tube removed tomorrow.  Spoke with pt at bedside. Noted ~90% completed lunch meal tray. Pt very pleased with PO intake and with plan for PEG removal tomorrow.  Pt reports consuming Ensure at least once a day. She also reports that she does take the ProSource. RD will continue with current supplement regimen.  Admit weight: 78.4  kg Current weight:80 kg  Meal Completion: 25-100%  Medications reviewed and include: ProSource Plus BID, Ensure Enlive BID, ritalin, MVI with minerals, klor-con, florastor  Labs reviewed. CBG's: 109-118 x 24 hours  Diet Order:   Diet Order            DIET DYS 2 Room service appropriate? Yes; Fluid consistency: Thin  Diet effective 0500                 EDUCATION NEEDS:   No education needs have been identified at this time  Skin:  Skin Assessment: Skin Integrity Issues: Incisions: neck  Last BM:  02/03/21  Height:   Ht Readings from Last 1 Encounters:  01/10/21 5\' 4"  (1.626 m)    Weight:   Wt Readings from Last 1 Encounters:  02/04/21 80 kg    BMI:  Body mass index is 30.27 kg/m.  Estimated Nutritional Needs:   Kcal:  1700-1900  Protein:  85-100 grams  Fluid:  1.7-1.9 L    Gustavus Bryant, MS, RD, LDN Inpatient Clinical Dietitian Please see AMiON for contact information.

## 2021-02-05 NOTE — Progress Notes (Signed)
Occupational Therapy Session Note  Patient Details  Name: [name] [name] MRN: [id number] Date of Birth: January 21, 1966  Today's Date: 02/05/2021 OT Individual Time: 1030-1058 OT Individual Time Calculation (min): 28 min    Short Term Goals: Week 1:  OT Short Term Goal 1 (Week 1): Pt will maintain unsupported sitting balance during self-care tasks for 5 mins with supevision OT Short Term Goal 1 - Progress (Week 1): Met OT Short Term Goal 2 (Week 1): Pt will complete UB bathing with mod assist OT Short Term Goal 2 - Progress (Week 1): Progressing toward goal OT Short Term Goal 3 (Week 1): Pt will complete UB dressing with max assist OT Short Term Goal 3 - Progress (Week 1): Met OT Short Term Goal 4 (Week 1): Pt will complete toilet transfer with max assist of 1 caregiver OT Short Term Goal 4 - Progress (Week 1): Met OT Short Term Goal 5 (Week 1): Pt will complete UB bathing with mod assist OT Short Term Goal 5 - Progress (Week 1): Met  Skilled Therapeutic Interventions/Progress Updates:    1:1. Pt received in recliner agreeable to OT. Pt requesting to change shirt and use bathroom. Pt ambulates to dresser to pick out desired shirt and dons in standing with S!! No cuing needed for orientation. Pt attempts toileting but no void and requires MAX cuing to pull up underwear with pants. Pt stands at sink with max multimodal cuing for R attention to locate toothpaste and turn toothbrush to apply toothpaste. 2x during session pt attempts to sit onto open dresser drawer like a chair with phusical A to prevent from full sit. OT installs elastic laces into shoes and pt requries MIN A to pull up heel in back of B shoes when donning. Exited session with pt seated in recliner, exit alarm on and call light in reach    Therapy Documentation Precautions:  Precautions Precautions: Fall,Other (comment) Precaution Comments: O2 via trach collar; PEG tube, delayed processing/initiation Restrictions Weight  Bearing Restrictions: No RUE Weight Bearing: Non weight bearing General:   Vital Signs: Therapy Vitals Temp: 98.3 F (36.8 C) Temp Source: Oral Pulse Rate: 84 Resp: 14 BP: 116/69 Patient Position (if appropriate): Lying Oxygen Therapy SpO2: 99 % O2 Device: Room Air Pain:   ADL: ADL Eating: NPO Grooming: Dependent Where Assessed-Grooming: Edge of bed Upper Body Bathing: Dependent Where Assessed-Upper Body Bathing: Edge of bed Lower Body Bathing: Dependent Where Assessed-Lower Body Bathing: Bed level Upper Body Dressing: Dependent Where Assessed-Upper Body Dressing: Edge of bed Lower Body Dressing: Dependent Where Assessed-Lower Body Dressing: Bed level Vision   Perception    Praxis   Exercises:   Other Treatments:     Therapy/Group: Individual Therapy  Tonny Branch 02/05/2021, 6:55 AM

## 2021-02-05 NOTE — Progress Notes (Signed)
Speech Language Pathology Daily Session Note  Patient Details  Name: [name] [name] MRN: [id number] Date of Birth: Mar 07, 1966  Today's Date: 02/05/2021 SLP Individual Time: 0705-0800 SLP Individual Time Calculation (min): 55 min  Short Term Goals: Week 4: SLP Short Term Goal 1 (Week 4): STGs=LTGs due to ELOS  Skilled Therapeutic Interventions: Skilled treatment session focused on dysphagia and cognitive goals. SLP facilitated session by providing skilled observation with breakfast meal of Dys. 2 textures with thin liquids. Patient consumed meal without overt s/s of aspiration but required set-up assist as well as Min verbal cues for problem solving and to clear oral residue. Recommend patient continue current diet. SLP also facilitated session by providing extra time and overall Min-Mod A verbal and question cues for problem solving and recall while answering a questionnaire about biographical information. Min verbal cues were also needed for use of an increased vocal intensity to achieve ~90% intelligibility. Patient left upright in recliner with alarm on and all needs within reach. Continue with current plan of care.      Pain No/Denies Pain   Therapy/Group: Individual Therapy  [name] 02/05/2021, 8:07 AM

## 2021-02-05 NOTE — Progress Notes (Signed)
Patient ID: [name] [name], female   DOB: 01/05/1966, 55 y.o.   MRN: [id number]  SW follow-up with pt husband [name] [name] [phone]) to provide updates from team conference on gains made in rehab, and d/c date remains 3/19. SW discussed d/c recommendations of outpatient PT/OT/SLP, and 3i1n BSC. Preferred location is [facility], and husband reports he will speak with family about DME needed. Will be here tomorrow for family edu 8am-12pm. SW discussed [facility] medication assistance program.   Outpatient referral faxed to [facility] ([phone]/[phone]). Pt set up for [place].    Loralee Pacas, MSW, [place] Office: [phone] Cell: [phone] Fax: [phone]

## 2021-02-06 LAB — GLUCOSE, CAPILLARY
Glucose-Capillary: 119 mg/dL — ABNORMAL HIGH (ref 70–99)
Glucose-Capillary: 114 mg/dL — ABNORMAL HIGH (ref 70–99)
Glucose-Capillary: 118 mg/dL — ABNORMAL HIGH (ref 70–99)
Glucose-Capillary: 96 mg/dL (ref 70–99)

## 2021-02-06 NOTE — Progress Notes (Signed)
Speech Language Pathology Daily Session Note  Patient Details  Name: KAALIYAH KITA MRN: [id number] Date of Birth: 07-04-1966  Today's Date: 02/06/2021 SLP Individual Time: 0815-0900 SLP Individual Time Calculation (min): 45 min  Short Term Goals: Week 4: SLP Short Term Goal 1 (Week 4): STGs=LTGs due to ELOS  Skilled Therapeutic Interventions: Skilled treatment session focused on completion of family education with the patient and her husband. Both educated on patient's current swallowing function, diet recommendations, appropriate textures, swallowing compensatory strategies and medication administration. Patient's husband was also educated on speech intelligibility strategies and appropriate cueing for EMST and IMST exercises. Patient took the opportunity to talk to her husband on how to best engage in cognitive activities to reduce "agitation" and frustration. SLP provided education regarding the important of utilizing functional tasks for cognitive recovery and increased independence was well as not "quizzing" the patient about biographical information, etc. He verbalized understanding but will need reinforcement. Handouts were also provided to reinforce all information. Patient left upright in recliner with husband present. Continue with current plan of care.      Pain No/Denies Pain   Therapy/Group: Individual Therapy  Antonios Ostrow 02/06/2021, 12:24 PM

## 2021-02-06 NOTE — Progress Notes (Signed)
Physical Therapy Session Note  Patient Details  Name: [name] [name] MRN: [id number] Date of Birth: 03-17-1966  Today's Date: 02/06/2021 PT Individual Time: 5301-0404 PT Individual Time Calculation (min): 25 min   Short Term Goals: Week 3:  PT Short Term Goal 1 (Week 3): Pt will perform bed mobility consistently with minA. PT Short Term Goal 1 - Progress (Week 3): Met PT Short Term Goal 2 (Week 3): Pt will perform bed to chair transfer consistently with CGA. PT Short Term Goal 2 - Progress (Week 3): Met PT Short Term Goal 3 (Week 3): Pt will ambulate 300' with CGA and LRAD. PT Short Term Goal 3 - Progress (Week 3): Met PT Short Term Goal 4 (Week 3): Pt will improve Berg score by MCID. PT Short Term Goal 4 - Progress (Week 3): Met  Skilled Therapeutic Interventions/Progress Updates:   Patient received sitting up in recliner, husband at bedside, agreeable to PT. She denies pain. She was able to ambulate to therapy gym with supervision, no AD, no LOB noted. Patient completing Wii sports with close supervision for balance. Initially hand over hand to coordinate release of trigger button for Wii bowling. After 5 trials with hand over hand, patient able to coordinate the release with just verbal cuing. Patient completing NuStep x8 mins for improved endurance and reciprocal coordination of B LE. She ambulated back to her room with supervision, returning to recliner. Seatbelt alarm on, call light within reach, husband at bedside.   Therapy Documentation Precautions:  Precautions Precautions: Fall,Other (comment) Precaution Comments: O2 via trach collar; PEG tube, delayed processing/initiation Restrictions Weight Bearing Restrictions: No RUE Weight Bearing: Non weight bearing    Therapy/Group: Individual Therapy  Karoline Caldwell, PT, DPT, CBIS  02/06/2021, 8:18 AM

## 2021-02-06 NOTE — Discharge Summary (Signed)
Physician Discharge Summary  Patient ID: [name] [name] MRN: [id number] DOB/AGE: October 20, 1966 55 y.o.  Admit date: 01/10/2021 Discharge date: 02/08/2021  Discharge Diagnoses:  Principal Problem:   Anoxic brain injury [facility]) Active Problems:   Malnutrition of moderate degree   Sinus tachycardia   Labile blood glucose   Dysphagia   Seizure prophylaxis Tracheostomy Dysphagia decreased nutritional storage status post gastrostomy tube Aspiration pneumonia Hypertension Obesity Cardiac arrest with V. fib  Discharged Condition: Stable  Significant Diagnostic Studies: DG Abd 1 View  Result Date: 01/09/2021 CLINICAL DATA:  Vomiting. EXAM: ABDOMEN - 1 VIEW COMPARISON:  Abdominal x-ray dated December 31, 2020. FINDINGS: Gastrostomy tube again noted. The bowel gas pattern is normal. No radio-opaque calculi or other significant radiographic abnormality are seen. No acute osseous abnormality. IMPRESSION: Negative. Electronically Signed   By: Titus Dubin M.D.   On: 01/09/2021 15:40   DG Swallowing Func-Speech Pathology  Result Date: 01/23/2021 Objective Swallowing Evaluation: Type of Study: MBS-Modified Barium Swallow Study  Patient Details Name: [name] [name] MRN: [id number] Date of Birth: 1966-03-07 Today's Date: 01/23/2021 Past Medical History: Past Medical History: Diagnosis Date . Hypertension  Past Surgical History: Past Surgical History: Procedure Laterality Date . PEG PLACEMENT N/A 12/13/2020  Procedure: PERCUTANEOUS ENDOSCOPIC GASTROSTOMY (PEG) PLACEMENT;  Surgeon: Lesly Rubenstein, MD;  Location: [facility] ENDOSCOPY;  Service: Endoscopy;  Laterality: N/A; . TRACHEOSTOMY TUBE PLACEMENT N/A 12/13/2020  Procedure: TRACHEOSTOMY;  Surgeon: Clyde Canterbury, MD;  Location: [facility] ORS;  Service: ENT;  Laterality: N/A; HPI: See H&P  No data recorded Assessment / Plan / Recommendation CHL IP CLINICAL IMPRESSIONS 01/23/2021 Clinical Impression Patient demonstrates a moderate oral and mild pharyngeal dysphagia.  Patient's oral phase is characterized by decreased bolus cohesion, piecemeal swallowing, and prolonged AP transit. Patient consistently triggered a swallow at the pyriform sinuses with liquids and valleculae with solid textures.  Despite delay, patient was able to fully protect her airway. Attempted to challenge the patient with sequential sips but unable to perform due to cognitive and motor planning deficits. Motor planning also intermittently impacted patient's efficiency with the straw.  Recommend patient initiate a diet of Dys. 1 textures with thin liquids via cup. Patient will need full supervision, consume all meals OOB and with limited distractions. Patient educated on results but will need reinforcement.  SLP Visit Diagnosis Dysphagia, oropharyngeal phase (R13.12) Attention and concentration deficit following -- Frontal lobe and executive function deficit following -- Impact on safety and function Mild aspiration risk;Moderate aspiration risk   No flowsheet data found.  Prognosis 01/23/2021 Prognosis for Safe Diet Advancement Good Barriers to Reach Goals Cognitive deficits Barriers/Prognosis Comment -- CHL IP DIET RECOMMENDATION 01/23/2021 SLP Diet Recommendations Dysphagia 1 (Puree) solids;Thin liquid Liquid Administration via Cup Medication Administration Crushed with puree Compensations Minimize environmental distractions;Slow rate;Small sips/bites Postural Changes (No Data)   CHL IP OTHER RECOMMENDATIONS 01/23/2021 Recommended Consults -- Oral Care Recommendations Oral care BID Other Recommendations --   CHL IP FOLLOW UP RECOMMENDATIONS 01/23/2021 Follow up Recommendations Outpatient SLP   CHL IP FREQUENCY AND DURATION 01/23/2021 Speech Therapy Frequency (ACUTE ONLY) min 3x week Treatment Duration 3 weeks      CHL IP ORAL PHASE 01/23/2021 Oral Phase Impaired Oral - Pudding Teaspoon -- Oral - Pudding Cup -- Oral - Honey Teaspoon -- Oral - Honey Cup -- Oral - Nectar Teaspoon Decreased bolus cohesion Oral - Nectar Cup  Decreased bolus cohesion Oral - Nectar Straw Decreased bolus cohesion Oral - Thin Teaspoon Decreased bolus cohesion Oral - Thin Cup  Decreased bolus cohesion;Piecemeal swallowing Oral - Thin Straw Decreased bolus cohesion;Piecemeal swallowing Oral - Puree Decreased bolus cohesion;Piecemeal swallowing;Weak lingual manipulation Oral - Mech Soft Impaired mastication;Weak lingual manipulation;Delayed oral transit;Decreased bolus cohesion Oral - Regular -- Oral - Multi-Consistency -- Oral - Pill -- Oral Phase - Comment --  CHL IP PHARYNGEAL PHASE 01/23/2021 Pharyngeal Phase Impaired Pharyngeal- Pudding Teaspoon -- Pharyngeal -- Pharyngeal- Pudding Cup -- Pharyngeal -- Pharyngeal- Honey Teaspoon -- Pharyngeal -- Pharyngeal- Honey Cup -- Pharyngeal -- Pharyngeal- Nectar Teaspoon Delayed swallow initiation-pyriform sinuses Pharyngeal Material does not enter airway Pharyngeal- Nectar Cup Delayed swallow initiation-pyriform sinuses Pharyngeal Material does not enter airway Pharyngeal- Nectar Straw Delayed swallow initiation-pyriform sinuses Pharyngeal Material does not enter airway Pharyngeal- Thin Teaspoon Delayed swallow initiation-pyriform sinuses Pharyngeal Material does not enter airway Pharyngeal- Thin Cup Delayed swallow initiation-pyriform sinuses Pharyngeal Material does not enter airway Pharyngeal- Thin Straw Delayed swallow initiation-pyriform sinuses Pharyngeal Material does not enter airway Pharyngeal- Puree Delayed swallow initiation-vallecula Pharyngeal -- Pharyngeal- Mechanical Soft Delayed swallow initiation-vallecula Pharyngeal -- Pharyngeal- Regular -- Pharyngeal -- Pharyngeal- Multi-consistency -- Pharyngeal -- Pharyngeal- Pill -- Pharyngeal -- Pharyngeal Comment --  CHL IP CERVICAL ESOPHAGEAL PHASE 01/23/2021 Cervical Esophageal Phase WFL Pudding Teaspoon -- Pudding Cup -- Honey Teaspoon -- Honey Cup -- Nectar Teaspoon -- Nectar Cup -- Nectar Straw -- Thin Teaspoon -- Thin Cup -- Thin Straw -- Puree --  Mechanical Soft -- Regular -- Multi-consistency -- Pill -- Cervical Esophageal Comment -- PAYNE, COURTNEY 01/23/2021, 2:57 PM    Weston Anna, MA, CCC-SLP [phone]            Labs:  Basic Metabolic Panel: No results for input(s): NA, K, CL, CO2, GLUCOSE, BUN, CREATININE, CALCIUM, MG, PHOS in the last 168 hours.  CBC: Recent Labs  Lab 02/03/21 0458  WBC 5.8  NEUTROABS 2.8  HGB 10.7*  HCT 33.5*  MCV 89.6  PLT 181    CBG: Recent Labs  Lab 02/04/21 0615 02/04/21 2112 02/05/21 0611 02/05/21 1132 02/05/21 1624  GLUCAP 140* 109* 118* 104* 103*   Family history.  Mother with hypertension leukemia as well as diabetes.  Denies any colon cancer esophageal cancer or rectal cancer  Brief HPI:   [name] [name] is a 55 y.o. right-handed female with history of obesity BMI 31.47, hypertension on no prescription medications as well as remote history of tobacco use.  Patient lives with spouse.  Reportedly independent prior to admission.  She does have children in the home who can assist.  Presented to [facility] on 12/02/2020 with witnessed cardiac arrest.  Per EMS she received 2 shocks by FD prior to EMS arrival.  After EMS arrived she was noted to be in ventricular fibrillation.  She did receive 1 round of epi.  Total downtime was unknown.  On arrival to the ED her GCS was 3.  She required intubation for airway protection.  Admission chemistries potassium 2.1 CO2 20 glucose 357 creatinine 1.01 AST 138 ALT 120 hemoglobin 9.7 WBC 11,900 hemoglobin A1c 5.9 troponin 71 and peaked at 900, lactic acid 10 urinalysis negative nitrite.  Code sepsis was initiated placed on ceftriaxone.  CT/MRI unremarkable for acute intracranial process.  Chest x-ray with no edema or airspace opacity.  She was completing a course of Unasyn for aspiration pneumonia.  EEG negative for seizure however it was suggestive of severe diffuse encephalopathy and patient remained on Keppra for seizure prophylaxis.  Echocardiogram with ejection  fraction of 60 to 65% no wall motion abnormalities no signs of  heart strain.  Palliative care was consulted to establish goals of care.  Hospital course tracheostomy 12/13/2020 per Dr. Clyde Canterbury as well as placement of gastrostomy tube for nutritional support 12/13/2020 per Dr. Andrey Farmer and remained NPO.  Cardiology service follow-up for ischemic evaluation post V. fib arrest Dr. Ida Rogue no current plan for invasive ischemic evaluation at this time given suboptimal candidate in the setting of current comorbidity conditions.  Neurology follow-up for suspect anoxic brain injury initial CT/MRI unremarkable was repeated 12/23/2020 findings consistent with anoxic brain injury.  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  Evolutionary changes likely related to evolving ischemic compared with prior MRI of 12/07/2020.  Therapy evaluations completed due to patient's cognitive deficits decreased functional mobility was admitted for a comprehensive rehab program   Hospital Course: [name] [name] was admitted to rehab 01/10/2021 for inpatient therapies to consist of PT, ST and OT at least three hours five days a week. Past admission physiatrist, therapy team and rehab RN have worked together to provide customized collaborative inpatient rehab.  Pertaining to patient's anoxic brain injury secondary to cardiac V. fib arrest acute respiratory failure remained stable.  Patient would follow-up with cardiology services.  Pain managed use of Tylenol she was weaned off of oxycodone.  Mood stabilization with Ritalin initiated to help patient maintain focus and attention to task she remained on low-dose Seroquel as needed.  Keppra ongoing for seizure prophylaxis and had been tapered to 500 mg twice daily.  EEG negative.  Blood pressure controlled on Norvasc as well as Lopressor she would need outpatient follow-up.  Tracheostomy tube 12/13/2020 per Dr. Clyde Canterbury decannulated  01/22/2021 without difficulty.  Stoma healed.  Dysphagia with decreased nutritional storage gastrostomy tube 12/13/2020 per Dr. Andrey Farmer diet had been advanced to a dysphagia #2 thin liquids monitoring of intake PEG tube removed 02/06/2021.  Noted obesity BMI 31.47 dietary follow-up.   Blood pressures were monitored on TID basis and soft and monitored     Rehab course: During patient's stay in rehab weekly team conferences were held to monitor patient's progress, set goals and discuss barriers to discharge. At admission, patient required total assist sit to supine max assist sit to stand max assist upper body dressing total assist lower body dressing  Physical exam.  Blood pressure 131/96 pulse 87 temperature 98.4 respirations 18 oxygen saturation 98% room air Constitutional.  No acute distress HEENT Head.  Normocephalic and atraumatic Eyes.  Pupils round and reactive to light no discharge.nystagmus Neck.  Supple nontender no JVD without thyromegaly Cardiac regular rate rhythm not extra sounds or murmur heard Abdomen.  Soft obese nontender positive bowel sounds.  Gastrostomy tube in place Respiratory effort normal no respiratory distress without wheeze Neurologic.  Alert makes eye contact with examiner limited participation spontaneous moving all extremities     He/She  has had improvement in activity tolerance, balance, postural control as well as ability to compensate for deficits. He/She has had improvement in functional use RUE/LUE  and RLE/LLE as well as improvement in awareness.  Sit to stand with supervision and no assistive device ambulating to the day room GM 150 feet supervision without assistive device.  She needed some cues for safety.  Completed session obstacle course of cones weaving in and out technique supervision contact-guard assist.  Ambulates to the dresser to pick out her close for the day dons her shirt with supervision.  She did need some assistance in the bathroom  for her pants.  Speech therapy follow-up for dysphagia and education with patient family.  Patient required min mod assist verbal cues for problem solving and recall while answering a questionnaire about biographical information.  Minimal verbal cues were also needed for use of increased vocal intensity to achieve 90% intelligibility.  Full family teaching completed plan discharge to home       Disposition: Discharged home    Diet: Dysphagia #2 thin liquids  Special Instructions: No driving smoking or alcohol  Medications at discharge 1.  Tylenol as needed 2.  Norvasc 10 mg p.o. daily 3.  Lipitor 80 mg p.o. daily 4.  Keppra 500 mg p.o. twice daily 5.  Ritalin 10 mg p.o. twice daily 6.  Lopressor 25 mg p.o. twice daily 7.  Seroquel 25 mg p.o. nightly as needed 8.  Florastor 250 mg p.o. twice daily  30-35 minutes were spent completing discharge summary and discharge planning  Discharge Instructions    Ambulatory referral to Occupational Therapy   Complete by: As directed    Eval and treat   Ambulatory referral to Physical Medicine Rehab   Complete by: As directed    Transition of care follow-up anoxic brain injury   Ambulatory referral to Physical Therapy   Complete by: As directed    Eval and treat   Ambulatory referral to Speech Therapy   Complete by: As directed    Eval and treat       Follow-up Information    Meredith Staggers, MD Follow up.   Specialty: Physical Medicine and Rehabilitation Why: Office to call for appointment Contact information: [address] [place] [address] [phone]        Clyde Canterbury, MD Follow up.   Specialty: Otolaryngology Why: Call for appointment Contact information: [place] [address] [phone]        Lesly Rubenstein, MD Follow up.   Specialty: Gastroenterology Why: Call for appointment Contact information: [place]  [address] [phone]        Minna Merritts, MD Follow up.   Specialty: Cardiology Why: Call for appointment Contact information: [place] [address] [phone]               Signed: Cathlyn Parsons 02/06/2021, 5:21 AM

## 2021-02-06 NOTE — Progress Notes (Addendum)
Occupational Therapy Discharge Summary  Patient Details  Name: [name] [name] MRN: [id number] Date of Birth: 12-19-1965  Today's Date: 02/07/2021 OT Individual Time: 9767-3419 OT Individual Time Calculation (min): 57 min    OT treatment session focused on increased independence with BADL tasks. Pt able to access dresser drawers, collect clothing, ambulate to bathroom all without assist from OT. Bathing/dressing completed with supervision, min verbal cues 2/2 apraxia. See functional navigator for further details.   Patient has met 11 of 11 long term goals due to improved activity tolerance, improved balance, postural control, ability to compensate for deficits, functional use of  RIGHT upper and RIGHT lower extremity, improved attention, improved awareness and improved coordination.  Patient to discharge at overall Supervision level.  Patient's care partner is independent to provide the necessary physical and cognitive assistance at discharge.    Reasons goals not met: n/a  Recommendation:  Patient will benefit from ongoing skilled OT services in outpatient setting to continue to advance functional skills in the area of BADL and Reduce care partner burden.  Equipment: 3-in-1 BSC  Reasons for discharge: treatment goals met and discharge from hospital  Patient/family agrees with progress made and goals achieved: Yes  OT Discharge Precautions/Restrictions  Precautions Precautions: Fall Restrictions Weight Bearing Restrictions: No Pain Pain Assessment Pain Scale: 0-10 Pain Score: 0-No pain denies pain ADL ADL Eating: Supervision/safety Grooming: Supervision/safety Where Assessed-Grooming: Edge of bed Upper Body Bathing: Supervision/safety Where Assessed-Upper Body Bathing: Shower Lower Body Bathing: Supervision/safety Where Assessed-Lower Body Bathing: Shower Upper Body Dressing: Supervision/safety Where Assessed-Upper Body Dressing: Edge of bed Lower Body Dressing:  Supervision/safety Where Assessed-Lower Body Dressing: Bed level Toileting: Supervision/safety Toilet Transfer: Close supervision Toilet Transfer Method: Ambulating Tub/Shower Transfer: Close supervison Tub/Shower Transfer Method: Ambulating Perception  Perception: Impaired Inattention/Neglect: Does not attend to right visual field;Does not attend to right side of body (MUCH improved since eval) Praxis Praxis: Impaired Praxis Impairment Details: Initiation;Motor planning Praxis-Other Comments: MUCH improved since eval Cognition Overall Cognitive Status: Impaired/Different from baseline Arousal/Alertness: Awake/alert Orientation Level: Oriented to person;Oriented to place;Oriented to time;Oriented to situation Attention: Selective;Sustained Focused Attention: Appears intact Sustained Attention: Appears intact Selective Attention: Impaired Selective Attention Impairment: Functional basic Memory: Impaired Memory Impairment: Decreased short term memory;Storage deficit;Decreased recall of new information Awareness: Impaired Awareness Impairment: Emergent impairment Problem Solving: Impaired Problem Solving Impairment: Functional basic Safety/Judgment: Impaired Sensation Sensation Light Touch: Appears Intact Coordination Gross Motor Movements are Fluid and Coordinated: No Fine Motor Movements are Fluid and Coordinated: No Coordination and Movement Description: continues to have some decreased smoothness and accuracy, but much improved since eval. B tremors at times Finger Nose Finger Test: dysmetria Motor  Motor Motor: Hemiplegia;Motor apraxia Motor - Discharge Observations: Generalized weakness, continues to have some motor planning deficits, but much improved since eval Mobility  Bed Mobility Bed Mobility: Rolling Right;Rolling Left;Sit to Supine;Supine to Sit Rolling Right: Independent Rolling Left: Independent Supine to Sit: Independent Sit to Supine:  Independent Transfers Sit to Stand: Supervision/Verbal cueing Stand to Sit: Supervision/Verbal cueing  Balance Balance Balance Assessed: Yes Static Sitting Balance Static Sitting - Level of Assistance: 7: Independent Dynamic Sitting Balance Dynamic Sitting - Balance Support: During functional activity Dynamic Sitting - Level of Assistance: 7: Independent Static Standing Balance Static Standing - Balance Support: During functional activity Static Standing - Level of Assistance: 5: Stand by assistance Dynamic Standing Balance Dynamic Standing - Balance Support: During functional activity Dynamic Standing - Level of Assistance: 5: Stand by assistance Extremity/Trunk Assessment RUE Assessment RUE Assessment:  Exceptions to [facility] Passive Range of Motion (PROM) Comments: WNL Active Range of Motion (AROM) Comments: [place] FF to 90, elbow, wrist, ahdn WFL AROM- strength 3/5 General Strength Comments: 3+/5 LUE Assessment LUE Assessment: Within Functional Limits Passive Range of Motion (PROM) Comments: WNL General Strength Comments: grossly 4/5-functional with generalized weakness   Valma Cava 02/07/2021, 12:34 PM

## 2021-02-06 NOTE — Progress Notes (Signed)
Recreational Therapy Session Note  Patient Details  Name: [name] [name] MRN: [id number] Date of Birth: Sep 25, 1966 Today's Date: 02/06/2021  Pain: no c/o Skilled Therapeutic Interventions/Progress Updates: Session focused on sitting balance, functional use of BUEs (fine and gross motor), selective attention in minimally distracting environment, visual scanning and self expression.  Today, pt continued working on creating her mask provided by the [facility].  Pt's husband present, observing and participatory in this session.  Pt utilized notes from yesterdays session with moderate multimodal cues to paint mask.  LRT facilitated discussion with pt and husband about the significance of the mask that [name] is creating and what it represented using notes from yesterdays session.  Husband stated understanding and participated by providing emotional support and set up assistance throughout.   Therapy/Group: Individual Therapy   Riot Waterworth 02/06/2021, 4:03 PM

## 2021-02-06 NOTE — Progress Notes (Signed)
PROGRESS NOTE   Subjective/Complaints: Pt up with SLP/husband in room. No new complaints. Cleaned breakfast tray with cueing of husband!  ROS: Patient denies fever, rash, sore throat, blurred vision, nausea, vomiting, diarrhea, cough, shortness of breath or chest pain,  headache, or mood change.    Objective:   No results found. No results for input(s): WBC, HGB, HCT, PLT in the last 72 hours. No results for input(s): NA, K, CL, CO2, GLUCOSE, BUN, CREATININE, CALCIUM in the last 72 hours.  Intake/Output Summary (Last 24 hours) at 02/06/2021 1125 Last data filed at 02/06/2021 0800 Gross per 24 hour  Intake 960 ml  Output --  Net 960 ml        Physical Exam: Vital Signs Blood pressure 128/79, pulse 79, temperature 97.8 F (36.6 C), temperature source Oral, resp. rate 14, height 5\' 4"  (1.626 m), weight 77.2 kg, SpO2 99 %.  Constitutional: No distress . Vital signs reviewed. HEENT: EOMI, oral membranes moist Neck: supple Cardiovascular: RRR without murmur. No JVD    Respiratory/Chest: CTA Bilaterally without wheezes or rales. Normal effort    GI/Abdomen: BS +, non-tender, non-distended, PEG intact Ext: no clubbing, cyanosis, or edema Psych: pleasant and cooperative Musc: No edema in extremities.  No tenderness in extremities. Neuro: Alert Dysphonic still but improving, some dysarthria.increased engagement. Better insight Motor 4 to 5/5, sensory exam intact.   Assessment/Plan: 1. Functional deficits which require 3+ hours per day of interdisciplinary therapy in a comprehensive inpatient rehab setting.  Physiatrist is providing close team supervision and 24 hour management of active medical problems listed below.  Physiatrist and rehab team continue to assess barriers to discharge/monitor patient progress toward functional and medical goals  Care Tool:  Bathing        Body parts bathed by helper: Right arm,Left  arm,Chest,Abdomen,Front perineal area,Buttocks,Right upper leg,Left upper leg,Right lower leg,Left lower leg,Face     Bathing assist Assist Level: Dependent - Patient 0%     Upper Body Dressing/Undressing Upper body dressing   What is the patient wearing?: Pull over shirt    Upper body assist Assist Level: Maximal Assistance - Patient 25 - 49%    Lower Body Dressing/Undressing Lower body dressing      What is the patient wearing?: Pants     Lower body assist Assist for lower body dressing: Maximal Assistance - Patient 25 - 49%     Toileting Toileting    Toileting assist Assist for toileting: 2 Helpers (2 helpers to turn pt in bed)     Transfers Chair/bed transfer  Transfers assist  Chair/bed transfer activity did not occur: Safety/medical concerns  Chair/bed transfer assist level: Supervision/Verbal cueing     Locomotion Ambulation   Ambulation assist   Ambulation activity did not occur: Safety/medical concerns  Assist level: Supervision/Verbal cueing Assistive device: No Device Max distance: 150'   Walk 10 feet activity   Assist  Walk 10 feet activity did not occur: Safety/medical concerns  Assist level: Supervision/Verbal cueing Assistive device: No Device   Walk 50 feet activity   Assist Walk 50 feet with 2 turns activity did not occur: Safety/medical concerns  Assist level: Supervision/Verbal cueing Assistive device: No Device  Walk 150 feet activity   Assist Walk 150 feet activity did not occur: Safety/medical concerns  Assist level: Supervision/Verbal cueing Assistive device: No Device    Walk 10 feet on uneven surface  activity   Assist Walk 10 feet on uneven surfaces activity did not occur: Safety/medical concerns         Wheelchair     Assist Will patient use wheelchair at discharge?: No (Per PT long-term goals)             Wheelchair 50 feet with 2 turns activity    Assist            Wheelchair  150 feet activity     Assist          Blood pressure 128/79, pulse 79, temperature 97.8 F (36.6 C), temperature source Oral, resp. rate 14, height 5\' 4"  (1.626 m), weight 77.2 kg, SpO2 99 %.  Medical Problem List and Plan: 1.  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 On track for dc 2.  Antithrombotics: -DVT/anticoagulation: Continue SCDs             -antiplatelet therapy: N/A 3. Pain Management:   -off oxycodone   3/15 tylenol alone for pain--controlled 4. Mood: Demonstrates no anxiety or agitation, has been alert during the day, d/ced Klonopin.               -antipsychotic agents: Continue Seroquel 25 mg nightly---changed to prn 3/14                         This medication is necessary. 5. Neuropsych: This patient is not capable of making decisions on her own behalf.  reduced Ritalin to 5mg  at 0700 and 1200 --no problems  6. Skin/Wound Care: Routine skin checks 7. Fluids/Electrolytes/Nutrition: TF changed to HS 8.  Seizure prophylaxis.  Keppra 1000 mg twice daily, decreased to 750 twice daily on 3/12  -decreased to 500mg  bid 3/16  EEG negative 9.  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    - decannulated 3/2 without any problems. Stoma  closed 10.  Dysphagia with decreased nutritional storage.  Gastrostomy tube 12/13/2020 per Dr. Andrey Farmer.  .  D2 thins, advance diet as tolerated   3/14-reduced TF to 35cc/hr to help stim appetite   3/15-dc'edTF to stimulate appetite   3/17--pt now NPO after midnight tonight    -husband well aware of nutritional needs, pushing po 11.  Aspiration pneumonia.  5-day course of Unasyn completed. 12.  Hypertension.  Continue Norvasc 10 mg daily, Lopressor 25 mg twice daily.  Patient would follow-up cardiology services.             controlled on 3/16 14.  Obesity.  BMI 31.47.  Dietary follow-up 15.    Tube feed induced hypoglycemia on prediabetes.  Hemoglobin  A1c 5.9.    Improving 3/15. Now off TF 16. Tachycardia:  HR controlled 17. Loose stool:  -improved   18. Anemia:   Hemoglobin 10.4 on 3/7--> 10.7 3/14  LOS: 27 days A FACE TO [place] 02/06/2021, 11:25 AM

## 2021-02-06 NOTE — Progress Notes (Signed)
Physical Therapy Session Note  Patient Details  Name: [name] [name] MRN: [id number] Date of Birth: June 08, 1966  Today's Date: 02/06/2021 PT Individual Time: 1020-1114 PT Individual Time Calculation (min): 54 min   Short Term Goals: Week 4:     Skilled Therapeutic Interventions/Progress Updates:     Pt received seated in recliner and agrees to therapy. No complaint of pain. Husband present for family education. PT educates pt's husband regarding pt's motor planning deficits and strategies for safe pt guarding upon discharge. WC transport to gym for time management. Pt completes car transfer with verbal cues for sequencing and positioning. Pt then ambulates >300' with supervision, with PT providing multistep commands and pt able to follow with min cueing. Pt then performs ambulation in and out of cones with mod verbal and tactile cueing to complete, and pt's husband taking turn guarding pt and providing cues. Pt ambulates to gym and performs 2x4 6" steps with PT providing CGA for first bout and husband guarding pt on 2nd bout. Pt ambulates back to room. Left seated in recliner with alarm intact and all needs within reach.  Therapy Documentation Precautions:  Precautions Precautions: Fall,Other (comment) Precaution Comments: O2 via trach collar; PEG tube, delayed processing/initiation Restrictions Weight Bearing Restrictions: No RUE Weight Bearing: Non weight bearing   Therapy/Group: Individual Therapy  Breck Coons, PT, DPT 02/06/2021, 4:08 PM

## 2021-02-06 NOTE — Progress Notes (Signed)
Speech Language Pathology Daily Session Note  Patient Details  Name: [name] [name] MRN: [id number] Date of Birth: [birth year]  Today's Date: 02/06/2021 SLP Individual Time: 6060-0459 SLP Individual Time Calculation (min): 24 min  Short Term Goals: Week 4: SLP Short Term Goal 1 (Week 4): STGs=LTGs due to ELOS  Skilled Therapeutic Interventions: Pt was seen for skilled ST targeting communication and speech intelligibility goals. SLP engaged pt in functional conversation regarding discharge plans. Provided Min A verbal cues for use of increased vocal intensity, pt was ~90% intelligible at the phrase and sentence levels. She also demonstrated excellent awareness of her current physical, cognitive, and communication deficits and things to consider related to safety and effectiveness of communication with her family members. She recalled her discharge date, as well as pertinent therapy information (ex: speech intelligibility strategies, need for use of standing support when dual tasking, etc.) with Min A verbal cues for recall. Pt left laying in bed with alarm set and needs within reach. Continue per current plan of care.          Pain Pain Assessment Pain Scale: 0-10 Pain Score: 0-No pain  Therapy/Group: Individual Therapy  [name] [name] 02/06/2021, 7:23 AM

## 2021-02-06 NOTE — Progress Notes (Signed)
Occupational Therapy Session Note  Patient Details  Name: [name] [name] MRN: [id number] Date of Birth: [birth year]  Today's Date: 02/06/2021 OT Individual Time: 1115-1200 OT Individual Time Calculation (min): 45 min   Short Term Goals: Week 4:  OT Short Term Goal 1 (Week 4): LTG=STG 2/2 ELOS  Skilled Therapeutic Interventions/Progress Updates:    Pt greeted seated in recliner with spouse present for family education. OT educated on pt progress within BADL tasks, home set-up, and DME needs. OT educated spouse on how to cue patient and set-her up with BADLs to promote more independence within functional tasks. Pt ambulated throughout session with supervision. Verbal cues for safety to sit down to step out of pants. Bathing completed with overall supervision. Pt was able to don her pants today without cues!! She then needed CGA for donning shirt 2.2 some confusion on orientation of shirt. OT educated on fine motor activities as well. Pt left seated in recliner at end of session with alarm belt on, call bell in reach, and needs met.   Therapy Documentation Precautions:  Precautions Precautions: Fall,Other (comment) Precaution Comments: O2 via trach collar; PEG tube, delayed processing/initiation Restrictions Weight Bearing Restrictions: No RUE Weight Bearing: Non weight bearing Pain:   denies pain  Therapy/Group: Individual Therapy  Valma Cava 02/06/2021, 12:19 PM

## 2021-02-07 ENCOUNTER — Other Ambulatory Visit: Payer: Self-pay | Admitting: Physical Medicine and Rehabilitation

## 2021-02-07 LAB — GLUCOSE, CAPILLARY
Glucose-Capillary: 112 mg/dL — ABNORMAL HIGH (ref 70–99)
Glucose-Capillary: 107 mg/dL — ABNORMAL HIGH (ref 70–99)
Glucose-Capillary: 99 mg/dL (ref 70–99)

## 2021-02-07 MED ORDER — METOPROLOL TARTRATE 25 MG PO TABS: 25.0000 mg | ORAL_TABLET | Freq: Two times a day (BID) | ORAL | 0 refills | Status: DC

## 2021-02-07 MED ORDER — FREE WATER: 50.0000 mL | Freq: Every day | Status: DC

## 2021-02-07 MED ORDER — AMLODIPINE BESYLATE 10 MG PO TABS: 10.0000 mg | ORAL_TABLET | Freq: Every day | ORAL | 0 refills | Status: DC

## 2021-02-07 MED ORDER — SACCHAROMYCES BOULARDII 250 MG PO CAPS: 250.0000 mg | ORAL_CAPSULE | Freq: Two times a day (BID) | ORAL | 0 refills | Status: DC

## 2021-02-07 MED ORDER — POTASSIUM CHLORIDE 20 MEQ PO PACK: 20.0000 meq | PACK | Freq: Every day | ORAL | 0 refills | Status: DC

## 2021-02-07 MED ORDER — METHYLPHENIDATE HCL 5 MG PO TABS
5.0000 mg | ORAL_TABLET | Freq: Two times a day (BID) | ORAL | Status: AC
  Administered 2021-02-07: 5 mg via ORAL
  Filled 2021-02-07: qty 1

## 2021-02-07 MED ORDER — ACETAMINOPHEN 325 MG PO TABS
650.0000 mg | ORAL_TABLET | Freq: Three times a day (TID) | ORAL | Status: DC | PRN
Start: 2021-02-07 — End: 2021-02-18

## 2021-02-07 MED ORDER — LEVETIRACETAM 750 MG PO TABS: 750.0000 mg | ORAL_TABLET | Freq: Two times a day (BID) | ORAL | 0 refills | Status: DC

## 2021-02-07 MED ORDER — GUAIFENESIN 100 MG/5ML PO SOLN: 200.0000 mg | ORAL | 0 refills | Status: DC | PRN

## 2021-02-07 MED ORDER — METHYLPHENIDATE HCL 5 MG PO TABS: 5.0000 mg | ORAL_TABLET | Freq: Two times a day (BID) | ORAL | 0 refills | Status: DC

## 2021-02-07 MED ORDER — ATORVASTATIN CALCIUM 80 MG PO TABS: 80.0000 mg | ORAL_TABLET | Freq: Every day | ORAL | 0 refills | Status: DC

## 2021-02-07 NOTE — Progress Notes (Signed)
Patient ID: [name] [name], female   DOB: [birth year], 55 y.o.   MRN: [id number]  SW follow-up with pt husband [name] [name] [phone]) to review d/c. Discussed outpatient therapies, and he indicated pt already has an appt scheduled with [facility] for PT. SW discussed [facility] medication assistance program. Amenable to medications being filled here at this pharmacy. Husband reports that he will be here today around 3-3:30pm after patient's last therapy session. NO further questions/concerns reported at this time.   Loralee Pacas, MSW, [place] Office: [phone] Cell: [phone] Fax: [phone]

## 2021-02-07 NOTE — Progress Notes (Signed)
Recreational Therapy Session Note  Patient Details  Name: [name] [name] MRN: [id number] Date of Birth: 24-Oct-1966 Today's Date: 02/07/2021  Pain: no c/o Skilled Therapeutic Interventions/Progress Updates: Pt completed mask project today.  Pt required LRT to write words on her mask although she did attempt to write words on paper.  Pt appreciative of this activity.  Reviewed notes from previous session and provided notes as a tool to aid in discussion with family and friends about her feelings.  Pt expressed need for toileting.  Pt ambulated to the bathroom with close supervision and performed toileting with supervision/min cues for pulling up pants.  Therapy/Group: Individual Therapy Blimie Vaness 02/07/2021, 11:44 AM

## 2021-02-07 NOTE — Progress Notes (Signed)
PROGRESS NOTE   Subjective/Complaints: Pt up in chair with OT. No new complaints today! Feels well.   ROS: Patient denies fever, rash, sore throat, blurred vision, nausea, vomiting, diarrhea, cough, shortness of breath or chest pain, joint or back pain, headache, or mood change.    Objective:   No results found. No results for input(s): WBC, HGB, HCT, PLT in the last 72 hours. No results for input(s): NA, K, CL, CO2, GLUCOSE, BUN, CREATININE, CALCIUM in the last 72 hours.  Intake/Output Summary (Last 24 hours) at 02/07/2021 0930 Last data filed at 02/06/2021 1754 Gross per 24 hour  Intake 360 ml  Output --  Net 360 ml        Physical Exam: Vital Signs Blood pressure 129/71, pulse 73, temperature 97.6 F (36.4 C), temperature source Oral, resp. rate 18, height 5\' 4"  (1.626 m), weight 77 kg, SpO2 97 %.  Constitutional: No distress . Vital signs reviewed. HEENT: EOMI, oral membranes moist Neck: supple Cardiovascular: RRR without murmur. No JVD    Respiratory/Chest: CTA Bilaterally without wheezes or rales. Normal effort    GI/Abdomen: BS +, non-tender, non-distended. PEG Ext: no clubbing, cyanosis, or edema Psych: pleasant and cooperative Musc: No edema in extremities.  No tenderness in extremities. Neuro: Alert Improving phonation, still dysarthria. Improving insight and awareness.  Motor 4+ to 5/5, sensory exam intact.   Assessment/Plan: 1. Functional deficits which require 3+ hours per day of interdisciplinary therapy in a comprehensive inpatient rehab setting.  Physiatrist is providing close team supervision and 24 hour management of active medical problems listed below.  Physiatrist and rehab team continue to assess barriers to discharge/monitor patient progress toward functional and medical goals  Care Tool:  Bathing    Body parts bathed by patient: Left arm,Right arm,Chest,Abdomen,Front perineal  area,Buttocks,Right upper leg,Left upper leg,Right lower leg,Left lower leg,Face   Body parts bathed by helper: Right arm,Left arm,Chest,Abdomen,Front perineal area,Buttocks,Right upper leg,Left upper leg,Right lower leg,Left lower leg,Face     Bathing assist Assist Level: Supervision/Verbal cueing     Upper Body Dressing/Undressing Upper body dressing   What is the patient wearing?: Pull over shirt    Upper body assist Assist Level: Supervision/Verbal cueing (and set-up)    Lower Body Dressing/Undressing Lower body dressing      What is the patient wearing?: Pants     Lower body assist Assist for lower body dressing: Supervision/Verbal cueing (and set-up)     Toileting Toileting    Toileting assist Assist for toileting: Supervision/Verbal cueing     Transfers Chair/bed transfer  Transfers assist  Chair/bed transfer activity did not occur: Safety/medical concerns  Chair/bed transfer assist level: Supervision/Verbal cueing     Locomotion Ambulation   Ambulation assist   Ambulation activity did not occur: Safety/medical concerns  Assist level: Supervision/Verbal cueing Assistive device: No Device Max distance: 150'   Walk 10 feet activity   Assist  Walk 10 feet activity did not occur: Safety/medical concerns  Assist level: Supervision/Verbal cueing Assistive device: No Device   Walk 50 feet activity   Assist Walk 50 feet with 2 turns activity did not occur: Safety/medical concerns  Assist level: Supervision/Verbal cueing Assistive device: No Device  Walk 150 feet activity   Assist Walk 150 feet activity did not occur: Safety/medical concerns  Assist level: Supervision/Verbal cueing Assistive device: No Device    Walk 10 feet on uneven surface  activity   Assist Walk 10 feet on uneven surfaces activity did not occur: Safety/medical concerns         Wheelchair     Assist Will patient use wheelchair at discharge?: No (Per PT  long-term goals)             Wheelchair 50 feet with 2 turns activity    Assist            Wheelchair 150 feet activity     Assist          Blood pressure 129/71, pulse 73, temperature 97.6 F (36.4 C), temperature source Oral, resp. rate 18, height 5\' 4"  (1.626 m), weight 77 kg, SpO2 97 %.  Medical Problem List and Plan: 1.  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  2.  Antithrombotics: -DVT/anticoagulation: Continue SCDs             -antiplatelet therapy: N/A 3. Pain Management:   -off oxycodone   3/18 tylenol alone for pain--controlled 4. Mood: Demonstrates no anxiety or agitation, has been alert during the day, d/ced Klonopin.               -antipsychotic agents: Continue Seroquel 25 mg nightly---changed to prn 3/14                         This medication is necessary. 5. Neuropsych: This patient is not capable of making decisions on her own behalf.  Dc ritalin today 3/18 6. Skin/Wound Care: Routine skin checks 7. Fluids/Electrolytes/Nutrition: TF changed to HS 8.  Seizure prophylaxis.  Keppra 1000 mg twice daily, decreased to 750 twice daily on 3/12  -decreased to 500mg  bid 3/16--continue on this dose for now  EEG negative 9.  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    - decannulated 3/2 without any problems. Stoma  closed 10.  Dysphagia with decreased nutritional storage.  Gastrostomy tube 12/13/2020 per Dr. Andrey Farmer.  .  -3/18 today pt's PEG was removed with traction. Pt tolerated well. No bleeding or abnormal discharge. May have sips of water this morning  D2 thins, resume at lunch     11.  Aspiration pneumonia.  5-day course of Unasyn completed. 12.  Hypertension.  Continue Norvasc 10 mg daily, Lopressor 25 mg twice daily.  Patient would follow-up cardiology services.             controlled on  3/16 14.  Obesity.  BMI 31.47.  Dietary follow-up 15.    Tube feed induced hypoglycemia on prediabetes.  Hemoglobin A1c 5.9.    Improving 3/15. Now off TF 16. Tachycardia:  HR controlled 17. Loose stool:  -improved   18. Anemia:   Hemoglobin 10.4 on 3/7--> 10.7 3/14  LOS: 28 days A FACE TO [place] 02/07/2021, 9:30 AM

## 2021-02-07 NOTE — Plan of Care (Signed)
  Problem: RH Balance Goal: LTG: Patient will maintain dynamic sitting balance (OT) Description: LTG:  Patient will maintain dynamic sitting balance with assistance during activities of daily living (OT) Outcome: Completed/Met Goal: LTG Patient will maintain dynamic standing with ADLs (OT) Description: LTG:  Patient will maintain dynamic standing balance with assist during activities of daily living (OT)  Outcome: Completed/Met   Problem: Sit to Stand Goal: LTG:  Patient will perform sit to stand in prep for activites of daily living with assistance level (OT) Description: LTG:  Patient will perform sit to stand in prep for activites of daily living with assistance level (OT) Outcome: Completed/Met   Problem: RH Grooming Goal: LTG Patient will perform grooming w/assist,cues/equip (OT) Description: LTG: Patient will perform grooming with assist, with/without cues using equipment (OT) Outcome: Completed/Met   Problem: RH Bathing Goal: LTG Patient will bathe all body parts with assist levels (OT) Description: LTG: Patient will bathe all body parts with assist levels (OT) Outcome: Completed/Met   Problem: RH Dressing Goal: LTG Patient will perform upper body dressing (OT) Description: LTG Patient will perform upper body dressing with assist, with/without cues (OT). Outcome: Completed/Met Goal: LTG Patient will perform lower body dressing w/assist (OT) Description: LTG: Patient will perform lower body dressing with assist, with/without cues in positioning using equipment (OT) Outcome: Completed/Met   Problem: RH Toileting Goal: LTG Patient will perform toileting task (3/3 steps) with assistance level (OT) Description: LTG: Patient will perform toileting task (3/3 steps) with assistance level (OT)  Outcome: Completed/Met   Problem: RH Functional Use of Upper Extremity Goal: LTG Patient will use RT/LT upper extremity as a (OT) Description: LTG: Patient will use right/left upper  extremity as a stabilizer/gross assist/diminished/nondominant/dominant level with assist, with/without cues during functional activity (OT) Outcome: Completed/Met   Problem: RH Toilet Transfers Goal: LTG Patient will perform toilet transfers w/assist (OT) Description: LTG: Patient will perform toilet transfers with assist, with/without cues using equipment (OT) Outcome: Completed/Met   Problem: RH Attention Goal: LTG Patient will demonstrate this level of attention during functional activites (OT) Description: LTG:  Patient will demonstrate this level of attention during functional activites  (OT) Outcome: Completed/Met

## 2021-02-07 NOTE — Progress Notes (Signed)
Recreational Therapy Discharge Summary Patient Details  Name: ASHONTE ANGELUCCI MRN: [id number] Date of Birth: 02-13-1966 Today's Date: 02/07/2021  Long term goals set: 1  Long term goals met: 1  Comments on progress toward goals: Pt has made good progress during LOS and is scheduled for discharge home with family to provide 24 hours supervision/assistance.  TR sessions focused on self expression task creating BIANC mask.  Pt required min assist for task completion and min-mod verbal cues.  Reasons for discharge: discharge from hospital   Patient/family agrees with progress made and goals achieved: Yes  Ledonna Dormer 02/07/2021, 11:48 AM

## 2021-02-07 NOTE — Plan of Care (Signed)
  Problem: Consults Goal: RH BRAIN INJURY PATIENT EDUCATION Description: Description: See Patient Education module for eduction specifics Outcome: Progressing   Problem: RH BOWEL ELIMINATION Goal: RH STG MANAGE BOWEL WITH ASSISTANCE Description: STG Manage Bowel with Assistance. Mod Outcome: Progressing   Problem: RH BLADDER ELIMINATION Goal: RH STG MANAGE BLADDER WITH ASSISTANCE Description: STG Manage Bladder With mod Assistance Outcome: Progressing   Problem: RH SAFETY Goal: RH STG ADHERE TO SAFETY PRECAUTIONS W/ASSISTANCE/DEVICE Description: STG Adhere to Safety Precautions With Assistance/Device. Mod assist Outcome: Progressing   Problem: RH COGNITION-NURSING Goal: RH STG USES MEMORY AIDS/STRATEGIES W/ASSIST TO PROBLEM SOLVE Description: STG Uses Memory Aids/Strategies With Assistance to Problem Solve. Mod assist Outcome: Progressing

## 2021-02-07 NOTE — Discharge Instructions (Signed)
Inpatient Rehab Discharge Instructions  [name] [name] Discharge date and time: 02/08/21   Activities/Precautions/ Functional Status: Activity: As tolerated Diet: Dysphagia #2 thin liquids Wound Care: Routine skin checks   Functional status:  ___ No restrictions     ___ Walk up steps independently _X__ 24/7 supervision/assistance   ___ Walk up steps with assistance ___ Intermittent supervision/assistance  ___ Bathe/dress independently ___ Walk with walker     _X__ Bathe/dress with assistance ___ Walk Independently    ___ Shower independently ___ Walk with assistance    ___ Shower with assistance _X__ No alcohol     ___ Return to work/school ________   COMMUNITY REFERRALS UPON DISCHARGE:     Outpatient: PT  OT   ST             Agency: [facility] Phone: [phone]             Appointment Date/Time:*Please expect follow-up within 7-10 business days to schedule appointment. If you have not received follow-up, be sure to contact site directly.*  Medical Equipment/Items Ordered:3in1 bedside commode (family to purchase)                                                 Agency/Supplier:   Special Instructions: 1. No driving smoking or alcohol 2. Can [facility] with [facility] or set up with primary care for follow up.    My questions have been answered and I understand these instructions. I will adhere to these goals and the provided educational materials after my discharge from the hospital.  Patient/Caregiver Signature _______________________________ Date __________  Clinician Signature _______________________________________ Date __________  Please bring this form and your medication list with you to all your follow-up doctor's appointments.

## 2021-02-07 NOTE — Progress Notes (Signed)
Inpatient Rehabilitation Care Coordinator Discharge Note  The overall goal for the admission was met for:   Discharge location: Yes. D/c to home with 24/7 care from family.   Length of Stay: Yes. 28 days.   Discharge activity level: Yes. Supervision.  Home/community participation: Yes. Limited.   Services provided included: MD, RD, PT, OT, SLP, RN, CM, TR, Pharmacy, Neuropsych and SW  Financial Services: Other: Uninsured  Choices offered to/list presented to:Yes  Follow-up services arranged: Outpatient: [facility] for outpatient PT/OT/SLP and DME: 3in1 BSC- family to purchase  Comments (or additional information):  Patient/Family verbalized understanding of follow-up arrangements: Yes  Individual responsible for coordination of the follow-up plan: contact pt husband Darrell [phone]  Confirmed correct DME delivered: Rana Snare 02/07/2021    Rana Snare

## 2021-02-07 NOTE — Progress Notes (Signed)
Speech Language Pathology Discharge Summary  Patient Details  Name: [name] [name] MRN: [id number] Date of Birth: 08-Feb-1966  Today's Date: 02/07/2021 SLP Individual Time: 1300-1355 SLP Individual Time Calculation (min): 55 min   Skilled Therapeutic Interventions:  Skilled treatment session focused on cognitive and communication goals. SLP facilitated session by providing Max A verbal cues for set-up of her RMT devices. Patient performed 25 repetitions of IMST/EMST exercises with overall Min A verbal cues for accuracy and efficiency. SLP also facilitated session by providing Mod A verbal and question cues for verbal reasoning and Min verbal cues for vocal intensity during a verbal description task. Patient requested to use the bathroom and ambulated to the commode with supervision but required Min verbal cues for problem solving during task. Patient was continent of urine. Patient ambulated to the bed and left supine with alarm on and all needs within reach. Continue with current plan of care.   Patient has met 8 of 8 long term goals.  Patient to discharge at [facility] level.   Reasons goals not met: N/A   Clinical Impression/Discharge Summary: Patient has made excellent gains and has met 8 of 8 LTGs this admission. Currently, patient is consuming Dys. 2 textures with thin liquids with minimal overt s/s of aspiration and requires overall Min A verbal cues for use of swallowing compensatory strategies. Patient continues to demonstrate decreased speech intelligibility due decreased breath support with a low vocal intensity, however, intelligibility has increased to ~90% at the phrase level with the utilization of RMT and speech intelligibility strategies. Patient's oveall function is impacted by moderate motor planning deficits resulting in overall Min A multimodal cues for functional problem solving. Patient also demonstrates improved recall of functional information and increased attention to  tasks. Patient and family education is complete and patient will discharge home with 24 hour supervision from family and hired caregivers. Patient would benefit from f/u SLP services to maximize her cognitive and swallowing function as well as her speech intelligibility in order to reduce caregiver burden.    Care Partner:  Caregiver Able to Provide Assistance: Yes  Type of Caregiver Assistance: Physical;Cognitive  Recommendation:  Outpatient SLP;24 hour supervision/assistance  Rationale for SLP Follow Up: Reduce caregiver burden;Maximize cognitive function and independence;Maximize swallowing safety;Maximize functional communication   Equipment: IMST/EMST devices   Reasons for discharge: Discharged from hospital;Treatment goals met   Patient/Family Agrees with Progress Made and Goals Achieved: Yes    [place], [place] 02/07/2021, 6:20 AM

## 2021-02-07 NOTE — Progress Notes (Signed)
Physical Therapy Discharge Summary  Patient Details  Name: [name] [name] MRN: [id number] Date of Birth: 19-Jul-1966  Today's Date: 02/07/2021 PT Individual Time: 1102-1200 and 0762-2633 PT Individual Time Calculation (min): 58 min and 40 min   Patient has met 8 of 8 long term goals due to improved activity tolerance, improved balance, improved postural control, increased strength, improved attention, improved awareness and improved coordination.  Patient to discharge at an ambulatory level Supervision.   Patient's care partner is independent to provide the necessary physical and cognitive assistance at discharge.  Reasons goals not met: NA  Recommendation:  Patient will benefit from ongoing skilled PT services in outpatient setting to continue to advance safe functional mobility, address ongoing impairments in motor planning, weakness, ambulation, balance, and minimize fall risk.  Equipment: No equipment provided  Reasons for discharge: treatment goals met and discharge from hospital  Patient/family agrees with progress made and goals achieved: Yes   Skilled Therapeutic Interventions:  1st Session: Pt received seated in [facility] and agrees to therapy. No complaint of pain. Sit to stand with supervision for safety and cues on hand placement. Pt ambulates with supervision with cues on sequencing. x300' to the gym. Pt performs car transfer and ramp navigation with verbal cues for positioning. Pt then performs BERG balance test and TUG, as detailed below, with clinically significant improvements in both. Pt ambulates to stairs and completes x12 6" steps with r hand rail and cues for safety and foot placement. Pt then ambulates to dayroom and participates in "drumming group" performed for NMR for activity tolerance, coordination, and direction following. PT cues pt for posture and body mechanics, and provides intermittent cues for sequencing of drum rhythms. Pt ambulates back to room. Left seated in  recliner with alarm intact and all needs within reach.  2nd Session: Pt received seated at EOB and agrees to therapy. Pt performs sit to stand with cues on initiation. Pt performs 6 minute walk test and ambulate 1154' without requiring rest break. PT provides cues for sequencing. Pt's private aide arrives for brief family ed session. PT provides education on current level of mobility and strategies for safe mobility following DC. Pt left seated in recliner with alarm intact and all needs within reach.  PT Discharge Precautions/Restrictions Precautions Precautions: Fall Restrictions Weight Bearing Restrictions: No Pain Pain Assessment Pain Scale: 0-10 Pain Score: 0-No pain Vision/Perception  Perception Perception: Impaired Inattention/Neglect: Does not attend to right visual field;Does not attend to right side of body (MUCH improved since eval) Praxis Praxis: Impaired Praxis Impairment Details: Initiation;Motor planning Praxis-Other Comments: MUCH improved since eval  Cognition Overall Cognitive Status: Impaired/Different from baseline Arousal/Alertness: Awake/alert Orientation Level: Oriented to person;Oriented to place;Oriented to time;Oriented to situation Attention: Selective;Sustained Focused Attention: Appears intact Sustained Attention: Appears intact Selective Attention: Impaired Selective Attention Impairment: Functional basic Memory: Impaired Memory Impairment: Decreased short term memory;Storage deficit;Decreased recall of new information Awareness: Impaired Awareness Impairment: Emergent impairment Problem Solving: Impaired Problem Solving Impairment: Functional basic Safety/Judgment: Impaired Sensation Sensation Light Touch: Appears Intact Coordination Gross Motor Movements are Fluid and Coordinated: No Fine Motor Movements are Fluid and Coordinated: No Coordination and Movement Description: continues to have some decreased smoothness and accuracy, but much  improved since eval. B tremors at times Finger Nose Finger Test: dysmetria Motor  Motor Motor: Hemiplegia;Motor apraxia Motor - Discharge Observations: Generalized weakness, continues to have some motor planning deficits, but much improved since eval  Mobility Bed Mobility Bed Mobility: Rolling Right;Rolling Left;Sit to Supine;Supine to Sit  Rolling Right: Independent Rolling Left: Independent Supine to Sit: Independent Sit to Supine: Independent Transfers Transfers: Sit to Stand;Stand to Sit;Stand Pivot Transfers Sit to Stand: Supervision/Verbal cueing Stand to Sit: Supervision/Verbal cueing Stand Pivot Transfers: Supervision/Verbal cueing Locomotion  Gait Ambulation: Yes Gait Assistance: Supervision/Verbal cueing Gait Distance (Feet): 1154 Feet Gait Assistance Details: Verbal cues for sequencing Gait Gait: Yes Gait Pattern: Within Functional Limits Stairs / Additional Locomotion Stairs: Yes Stairs Assistance: Supervision/Verbal cueing Stair Management Technique: One rail Right Number of Stairs: 12 Height of Stairs: 6 Ramp: Supervision/Verbal cueing Curb: Supervision/Verbal cueing Wheelchair Mobility Wheelchair Mobility: No  Trunk/Postural Assessment  Cervical Assessment Cervical Assessment:  (forward head) Thoracic Assessment Thoracic Assessment:  (rounded shoulders) Lumbar Assessment Lumbar Assessment:  (posterior pelvic tilt) Postural Control Postural Control: Within Functional Limits  Balance Balance Balance Assessed: Yes Berg Balance Test Sit to Stand: Able to stand without using hands and stabilize independently Standing Unsupported: Able to stand safely 2 minutes Sitting with Back Unsupported but Feet Supported on Floor or Stool: Able to sit safely and securely 2 minutes Stand to Sit: Sits safely with minimal use of hands Transfers: Able to transfer safely, minor use of hands Standing Unsupported with Eyes Closed: Able to stand 10 seconds safely Standing  Ubsupported with Feet Together: Able to place feet together independently and stand for 1 minute with supervision From Standing, Reach Forward with Outstretched Arm: Can reach forward >12 cm safely (5") From Standing Position, Pick up Object from Floor: Able to pick up shoe safely and easily From Standing Position, Turn to Look Behind Over each Shoulder: Looks behind from both sides and weight shifts well Turn 360 Degrees: Able to turn 360 degrees safely but slowly Standing Unsupported, Alternately Place Feet on Step/Stool: Able to stand independently and safely and complete 8 steps in 20 seconds Standing Unsupported, One Foot in Front: Needs help to step but can hold 15 seconds Standing on One Leg: Able to lift leg independently and hold > 10 seconds Total Score: 49 Timed Up and Go Test TUG: Normal TUG Normal TUG (seconds): 9.9 Static Sitting Balance Static Sitting - Level of Assistance: 7: Independent Dynamic Sitting Balance Dynamic Sitting - Level of Assistance: 7: Independent Static Standing Balance Static Standing - Balance Support: During functional activity Static Standing - Level of Assistance: 5: Stand by assistance Dynamic Standing Balance Dynamic Standing - Balance Support: During functional activity Dynamic Standing - Level of Assistance: 5: Stand by assistance Extremity Assessment  RUE Assessment RUE Assessment: Exceptions to [facility] Passive Range of Motion (PROM) Comments: WNL Active Range of Motion (AROM) Comments: [place] FF to 90, elbow, wrist, ahdn WFL AROM- strength 3/5 General Strength Comments: 3+/5 LUE Assessment LUE Assessment: Within Functional Limits Passive Range of Motion (PROM) Comments: WNL General Strength Comments: grossly 4/5-functional with generalized weakness RLE Assessment RLE Assessment: Exceptions to [facility] Passive Range of Motion (PROM) Comments: [facility] General Strength Comments: Grossly 4+/5 LLE Assessment LLE Assessment: Exceptions to [facility] Passive  Range of Motion (PROM) Comments: [facility] General Strength Comments: Grossly 4+/5    Breck Coons, PT, DPT 02/07/2021, 3:52 PM

## 2021-02-08 DIAGNOSIS — E440 Moderate protein-calorie malnutrition: Secondary | ICD-10-CM

## 2021-02-08 LAB — GLUCOSE, CAPILLARY: Glucose-Capillary: 131 mg/dL — ABNORMAL HIGH (ref 70–99)

## 2021-02-08 NOTE — Progress Notes (Signed)
PROGRESS NOTE   Subjective/Complaints: Pt eating breakfast with husband when I arrived. Very happy about her progress  ROS: Patient denies fever, rash, sore throat, blurred vision, nausea, vomiting, diarrhea, cough, shortness of breath or chest pain, joint or back pain, headache, or mood change.   Objective:   No results found. No results for input(s): WBC, HGB, HCT, PLT in the last 72 hours. No results for input(s): NA, K, CL, CO2, GLUCOSE, BUN, CREATININE, CALCIUM in the last 72 hours.  Intake/Output Summary (Last 24 hours) at 02/08/2021 1040 Last data filed at 02/08/2021 0807 Gross per 24 hour  Intake 480 ml  Output --  Net 480 ml        Physical Exam: Vital Signs Blood pressure 124/82, pulse 87, temperature 98.9 F (37.2 C), temperature source Oral, resp. rate 18, height 5\' 4"  (1.626 m), weight 80.7 kg, SpO2 99 %.  Constitutional: No distress . Vital signs reviewed. HEENT: EOMI, oral membranes moist Neck: supple Cardiovascular: RRR without murmur. No JVD    Respiratory/Chest: CTA Bilaterally without wheezes or rales. Normal effort    GI/Abdomen: BS +, non-tender, non-distended, PEG stoma closing with granulation. No new drainage seen Ext: no clubbing, cyanosis, or edema Psych: pleasant and cooperative Musc: No edema in extremities.  No tenderness in extremities. Neuro: Alert Dysphonic. Better insight and awareness.  Motor 4+ to 5/5, sensory exam intact.   Assessment/Plan: 1. Functional deficits which require 3+ hours per day of interdisciplinary therapy in a comprehensive inpatient rehab setting.  Physiatrist is providing close team supervision and 24 hour management of active medical problems listed below.  Physiatrist and rehab team continue to assess barriers to discharge/monitor patient progress toward functional and medical goals  Care Tool:  Bathing    Body parts bathed by patient: Left arm,Right  arm,Chest,Abdomen,Front perineal area,Buttocks,Right upper leg,Left upper leg,Right lower leg,Left lower leg,Face   Body parts bathed by helper: Right arm,Left arm,Chest,Abdomen,Front perineal area,Buttocks,Right upper leg,Left upper leg,Right lower leg,Left lower leg,Face     Bathing assist Assist Level: Supervision/Verbal cueing     Upper Body Dressing/Undressing Upper body dressing   What is the patient wearing?: Pull over shirt    Upper body assist Assist Level: Supervision/Verbal cueing (and set-up)    Lower Body Dressing/Undressing Lower body dressing      What is the patient wearing?: Pants     Lower body assist Assist for lower body dressing: Supervision/Verbal cueing (and set-up)     Toileting Toileting    Toileting assist Assist for toileting: Supervision/Verbal cueing     Transfers Chair/bed transfer  Transfers assist  Chair/bed transfer activity did not occur: Safety/medical concerns  Chair/bed transfer assist level: Supervision/Verbal cueing     Locomotion Ambulation   Ambulation assist   Ambulation activity did not occur: Safety/medical concerns  Assist level: Supervision/Verbal cueing Assistive device: No Device Max distance: 1154'   Walk 10 feet activity   Assist  Walk 10 feet activity did not occur: Safety/medical concerns  Assist level: Supervision/Verbal cueing Assistive device: No Device   Walk 50 feet activity   Assist Walk 50 feet with 2 turns activity did not occur: Safety/medical concerns  Assist level: Supervision/Verbal cueing  Assistive device: No Device    Walk 150 feet activity   Assist Walk 150 feet activity did not occur: Safety/medical concerns  Assist level: Supervision/Verbal cueing Assistive device: No Device    Walk 10 feet on uneven surface  activity   Assist Walk 10 feet on uneven surfaces activity did not occur: Safety/medical concerns   Assist level: Supervision/Verbal cueing      Wheelchair     Assist Will patient use wheelchair at discharge?: No             Wheelchair 50 feet with 2 turns activity    Assist            Wheelchair 150 feet activity     Assist          Blood pressure 124/82, pulse 87, temperature 98.9 F (37.2 C), temperature source Oral, resp. rate 18, height 5\' 4"  (1.626 m), weight 80.7 kg, SpO2 99 %.  Medical Problem List and Plan: 1.  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  -Patient to see me in the office for transitional care encounter in 1-2 weeks.  2.  Antithrombotics: -DVT/anticoagulation: Continue SCDs             -antiplatelet therapy: N/A 3. Pain Management:   -off oxycodone   3/18 tylenol alone for pain--controlled 4. Mood: Demonstrates no anxiety or agitation, has been alert during the day, d/ced Klonopin.               -antipsychotic agents: Continue Seroquel 25 mg nightly---changed to prn 3/14                         This medication is necessary. 5. Neuropsych: This patient is not capable of making decisions on her own behalf.  Stopped ritalin 6. Skin/Wound Care: Routine skin checks 7. Fluids/Electrolytes/Nutrition: TF changed to HS 8.  Seizure prophylaxis.  Keppra 1000 mg twice daily, decreased to 750 twice daily on 3/12  -continue keppra 500mg  bid    EEG negative 9.  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    - decannulated 3/2 without any problems. Stoma  closed 10.  Dysphagia with decreased nutritional storage.  Gastrostomy tube 12/13/2020 per Dr. Andrey Farmer.  .  -3/18   PEG was removed with traction. Pt tolerated well. No bleeding or abnormal discharge.    D2 thins, resumed     11.  Aspiration pneumonia.  5-day course of Unasyn completed. 12.  Hypertension.  Continue Norvasc 10 mg daily, Lopressor 25 mg twice daily.  Patient would follow-up cardiology services.              controlled on 3/19 14.  Obesity.  BMI 31.47.  Dietary follow-up 15.    Tube feed induced hypoglycemia on prediabetes.  Hemoglobin A1c 5.9.    Improving 3/15. Now off TF 16. Tachycardia:  HR controlled 17. Loose stool:  -improved   18. Anemia:   Hemoglobin 10.4 on 3/7--> 10.7 3/14  LOS: 29 days A FACE TO [place] 02/08/2021, 10:40 AM

## 2021-02-09 NOTE — Plan of Care (Signed)
  Problem: Consults Goal: RH BRAIN INJURY PATIENT EDUCATION Description: Description: See Patient Education module for eduction specifics Outcome: Completed/Met   Problem: RH BOWEL ELIMINATION Goal: RH STG MANAGE BOWEL WITH ASSISTANCE Description: STG Manage Bowel with Assistance. Mod Outcome: Completed/Met   Problem: RH BLADDER ELIMINATION Goal: RH STG MANAGE BLADDER WITH ASSISTANCE Description: STG Manage Bladder With mod Assistance Outcome: Completed/Met   Problem: RH SAFETY Goal: RH STG ADHERE TO SAFETY PRECAUTIONS W/ASSISTANCE/DEVICE Description: STG Adhere to Safety Precautions With Assistance/Device. Mod assist Outcome: Completed/Met   Problem: RH COGNITION-NURSING Goal: RH STG USES MEMORY AIDS/STRATEGIES W/ASSIST TO PROBLEM SOLVE Description: STG Uses Memory Aids/Strategies With Assistance to Problem Solve. Mod assist Outcome: Completed/Met

## 2021-02-11 ENCOUNTER — Telehealth: Payer: Self-pay | Admitting: *Deleted

## 2021-02-11 ENCOUNTER — Encounter: Payer: Self-pay | Admitting: Occupational Therapy

## 2021-02-11 ENCOUNTER — Ambulatory Visit: Payer: Self-pay | Attending: Physician Assistant | Admitting: Speech Pathology

## 2021-02-11 ENCOUNTER — Other Ambulatory Visit: Payer: Self-pay

## 2021-02-11 ENCOUNTER — Ambulatory Visit: Payer: Self-pay | Admitting: Occupational Therapy

## 2021-02-11 DIAGNOSIS — R41841 Cognitive communication deficit: Secondary | ICD-10-CM | POA: Insufficient documentation

## 2021-02-11 DIAGNOSIS — R471 Dysarthria and anarthria: Secondary | ICD-10-CM | POA: Insufficient documentation

## 2021-02-11 DIAGNOSIS — R1312 Dysphagia, oropharyngeal phase: Secondary | ICD-10-CM | POA: Insufficient documentation

## 2021-02-11 DIAGNOSIS — R278 Other lack of coordination: Secondary | ICD-10-CM | POA: Insufficient documentation

## 2021-02-11 DIAGNOSIS — M6281 Muscle weakness (generalized): Secondary | ICD-10-CM

## 2021-02-11 DIAGNOSIS — G931 Anoxic brain damage, not elsewhere classified: Secondary | ICD-10-CM | POA: Insufficient documentation

## 2021-02-11 NOTE — Telephone Encounter (Signed)
Transition Care Management Unsuccessful Follow-up Telephone Call  Date of discharge and from where:  02/08/2021, [facility]  Attempts:  1st Attempt  Reason for unsuccessful TCM follow-up call:  Voice mail full

## 2021-02-11 NOTE — Therapy (Signed)
[place] MAIN [facility] SERVICES [address] [place], [place], [address] Phone: [phone]   Fax:  [phone]  Occupational Therapy Evaluation  Patient Details  Name: [name] [name] MRN: [id number] Date of Birth: 03/01/1966 No data recorded  Encounter Date: 02/11/2021   OT End of Session - 02/11/21 1244    Visit Number 1    Number of Visits 24    Date for OT Re-Evaluation 05/13/21    Authorization Type Progress reports period starting 02/11/2021    OT Start Time 1107    OT Stop Time 1210    OT Time Calculation (min) 63 min    Activity Tolerance Patient tolerated treatment well    Behavior During Therapy [facility] for tasks assessed/performed           Past Medical History:  Diagnosis Date   Hypertension     Past Surgical History:  Procedure Laterality Date   PEG PLACEMENT N/A 12/13/2020   Procedure: PERCUTANEOUS ENDOSCOPIC GASTROSTOMY (PEG) PLACEMENT;  Surgeon: Lesly Rubenstein, MD;  Location: [facility] ENDOSCOPY;  Service: Endoscopy;  Laterality: N/A;   TRACHEOSTOMY TUBE PLACEMENT N/A 12/13/2020   Procedure: TRACHEOSTOMY;  Surgeon: Clyde Canterbury, MD;  Location: [facility] ORS;  Service: ENT;  Laterality: N/A;    There were no vitals filed for this visit.   Subjective Assessment - 02/11/21 1231    Subjective  Pt. was present with her husband    Patient is accompanied by: Family member    Pertinent History Pt. is a 55 y.o. female who went into cardiac arrest, and was admitted to [facility] on 12/02/2020. Pt. was diagnosed with Anoxic Brain Injury following the Cardiac Arrest. When medically stable, the pt. was tranferred to [place] rehab. The patient was discharged home on 02/08/2021, and is ready to begin outpatient OT services. Pt. was independent, and working prior to the onset. Pt. Has very supportive family. Pt. Enjoys gardening.   Currently in Pain? No/denies             [facility] OT Assessment - 02/11/21 0001      Assessment    Medical Diagnosis Anoxic Brain Injury    Onset Date/Surgical Date 12/02/20    Hand Dominance Right      Precautions   Precautions --   24 Hour care     Restrictions   Weight Bearing Restrictions No      Balance Screen   Has the patient fallen in the past 6 months No    Has the patient had a decrease in activity level because of a fear of falling?  No    Is the patient reluctant to leave their home because of a fear of falling?  No      Home  Environment   Family/patient expects to be discharged to: Private residence    Living Arrangements Spouse/significant other    Type of [place] Two level    [place] - Number of Steps 5    Bathroom Shower/Tub [place];Door    [place] held shower head;Bedside commode    Lives With Spouse      Prior Function   Level of Independence Independent    Vocation Full time employment    Vocation Requirements Trucking    Leisure Flowers      ADL   Eating/Feeding Independent    Scientist, clinical (histocompatibility and immunogenetics)  Supervision/safety    Lower Body Bathing Supervision/safety    Upper Body Dressing Minimal assistance    Lower Body Dressing Minimal assistance    Toilet Transfer Supervision/safety    Toileting -  Hygiene Independent    Tub/Shower Transfer Supervision/safety      IADL   Prior Level of Function Shopping Independent    Shopping Needs to be accompanied on any shopping trip    Prior Level of Function Light Housekeeping Independent    Light Housekeeping Performs light daily tasks such as dishwashing, bed making;[place] small items, rinses stockings, etc.    Prior Level of Function Meal Prep Independent    Meal Prep Needs to have meals prepared and served   Is able to fix a drink   Prior Level of Function Scientist, research (physical sciences) Relies on family or friends for transportation    Prior Level of Function Medication  Managment Independent    Medication Management Is not capable of dispensing or managing own medication    Prior Level of Function Financial Management Independent    Financial Management Dependent      Written Expression   Dominant Hand Right    Handwriting Not legible      Vision - History   Visual History --   Reading glassess   Patient Visual Report Nausea/blurring vision with head movement      Activity Tolerance   Activity Tolerance Tolerates 30 min activity with multiple rests      Cognition   Overall Cognitive Status Impaired/Different from baseline      Observation/Other Assessments   Focus on Therapeutic Outcomes (FOTO)  72.17      Sensation   Light Touch Appears Intact      Coordination   Right 9 Hole Peg Test 1 min. & 26 sec.    Left 9 Hole Peg Test 1 min & 10 sec.      AROM   Overall AROM Comments Right shoulder flexion 104(112), Abduction: 80(95)      Strength   Overall Strength Comments RUE: 3-/5 shoulder flexion, abduction, 3+/5 elbow flexion, extension. LUE strength: 4/5.      Hand Function   Right Hand Grip (lbs) 7    Right Hand Lateral Pinch 5 lbs    Right Hand 3 Point Pinch 5 lbs    Left Hand Grip (lbs) 10    Left Hand Lateral Pinch 7 lbs    Left 3 point pinch 4 lbs                           OT Education - 02/11/21 1243    Education Details OT services, POC, goals.    Person(s) Educated Patient;Spouse    Methods Explanation;Demonstration    Comprehension Verbalized understanding;Returned demonstration               OT Long Term Goals - 02/11/21 1459      OT LONG TERM GOAL #1   Title Pt. will be able to independently sign her name with 100% legibility.    Baseline Eval: Signature not legible, however was able to sign on the line with no deviation above, or below the line.    Time 12    Period Weeks    Status New    Target Date 05/13/21      OT LONG TERM GOAL #2   Title Pt. will increase right shoulder AROM to be  able to independently  retrieve items from closets, and cabinets.    Baseline Eval: Pt. presents with limited ROM.    Time 12    Period Weeks    Status New    Target Date 05/13/21      OT LONG TERM GOAL #3   Title Pt. will increase right grip strength by 5# to be able hold a full glass    Baseline Eval: limited grip strength. Limited bilateral digit flexion, unable to make a full composite fist.    Time 12    Period Weeks    Status New    Target Date 05/13/21      OT LONG TERM GOAL #4   Title Pt. will independently access a computer, and type a one sentence email correspondence.    Baseline Eval: Pt. is unable to to perform    Time 12    Period Weeks    Status New    Target Date 05/13/21      OT LONG TERM GOAL #5   Title Pt. will improve bilateral [facility] skills to be able to grasp, and manipulate small objects during ADLs.    Baseline Eval: Pt. presents withlimite [facility] skills with difficulty picking,a nd manipulating small objects.    Time 12    Period Weeks    Status New    Target Date 05/13/21      OT LONG TERM GOAL #6   Title Pt. will improve UE strength by 2 mm grades to assist with ADLs, and IADLS,    Baseline Eval: pt. has difficulty performing.    Time 12    Period Weeks    Status New    Target Date 05/13/21      OT LONG TERM GOAL #7   Title Pt. will complete light meal preparation with minA    Baseline Eval: Pt. is unable to prepare light meal preparation.    Time 12    Period Weeks    Status New    Target Date 05/13/21      OT LONG TERM GOAL #8   Title Pt. will perform light home management tasks with minA    Baseline Eval: pt. is unable to perform    Time 12    Period Weeks    Status New    Target Date 05/13/21                 Plan - 02/11/21 1246    Clinical Impression Statement Pt. is a 55 y.o. female who went into cardiac arrest, and was admitted to [facility] on 12/02/2020 with Anoxic Brain Injury. Pt. presents with motor apraxia, weakness, decreased  grip strength, pinch strength, motor control,  [place] skills, cognitive, and vision changes which limit her ability to complete daily ADL, and IADL tasks. Pt. will benefit from skilled OT services to work on these impairments in order to improve UE functioning, increase bilateral UE engagement during daily self-care,  home management tasks, and maximize overall independence wtih morning care tasks, writing, typing, light housekeeping, and simple meal preparation.   OT Occupational Profile and History Problem Focused Assessment - Including review of records relating to presenting problem    Occupational performance deficits (Please refer to evaluation for details): ADL's;IADL's    Body Structure / Function / Physical Skills ADL;IADL;FMC;ROM;Strength    Rehab Potential Good    Clinical Decision Making Several treatment options, min-mod task modification necessary    Comorbidities Affecting Occupational Performance: May have comorbidities impacting occupational performance  Modification or Assistance to Complete Evaluation  Min-Moderate modification of tasks or assist with assess necessary to complete eval    OT Frequency 2x / week    OT Duration 12 weeks    OT Treatment/Interventions Self-care/ADL training;Neuromuscular education;Therapeutic activities;Patient/family education;DME and/or AE instruction;Visual/perceptual remediation/compensation    Consulted and Agree with Plan of Care Patient           Patient will benefit from skilled therapeutic intervention in order to improve the following deficits and impairments:   Body Structure / Function / Physical Skills: ADL,IADL,FMC,ROM,Strength       Visit Diagnosis: Muscle weakness (generalized)  Other lack of coordination    Problem List Patient Active Problem List   Diagnosis Date Noted   Dysarthria and anarthria 02/11/2021   Sinus tachycardia    Labile blood glucose    Dysphagia    Seizure prophylaxis    Malnutrition of  moderate degree 01/23/2021   Anoxic brain injury ([place]) 01/10/2021   Prediabetes    Dyslipidemia    S/P percutaneous endoscopic gastrostomy (PEG) tube placement [facility])    Status post tracheostomy ([place])    Aspiration pneumonia of both lower lobes due to gastric secretions ([place]) 01/05/2021   Acute respiratory failure ([place])    Hypoxic ischemic encephalopathy    Endotracheal tube present    Cardiac arrest ([place]) 12/02/2020   Essential hypertension 12/09/2011    Harrel Carina, MS, OTR/L 02/11/2021, 3:22 PM  [place] MAIN [facility] SERVICES [address] [place], [place], [address] Phone: [phone]   Fax:  [phone]  Name: ALESA ECHEVARRIA MRN: [id number] Date of Birth: Aug 10, 1966

## 2021-02-11 NOTE — Therapy (Signed)
[place] MAIN [facility] SERVICES [address] [place], [place], [address] Phone: [phone]   Fax:  [phone]  Speech Language Pathology Evaluation  Patient Details  Name: [name] [name] MRN: [id number] Date of Birth: 1966-08-09 Referring Provider (SLP): Marlowe Shores, [place]   Encounter Date: 02/11/2021   End of Session - 02/11/21 1142    Visit Number 1    Number of Visits 25    Date for SLP Re-Evaluation 05/06/21    Authorization Type Medicaid    Authorization Time Period 02/11/2021 thru 05/06/2021    Authorization - Visit Number 1    Progress Note Due on Visit 10    SLP Start Time 1000    SLP Stop Time  1100    SLP Time Calculation (min) 60 min    Activity Tolerance Patient tolerated treatment well           Past Medical History:  Diagnosis Date  . Hypertension     Past Surgical History:  Procedure Laterality Date  . PEG PLACEMENT N/A 12/13/2020   Procedure: PERCUTANEOUS ENDOSCOPIC GASTROSTOMY (PEG) PLACEMENT;  Surgeon: Lesly Rubenstein, MD;  Location: [facility] ENDOSCOPY;  Service: Endoscopy;  Laterality: N/A;  . TRACHEOSTOMY TUBE PLACEMENT N/A 12/13/2020   Procedure: TRACHEOSTOMY;  Surgeon: Clyde Canterbury, MD;  Location: [facility] ORS;  Service: ENT;  Laterality: N/A;    There were no vitals filed for this visit.   Subjective Assessment - 02/11/21 1123    Subjective pt pleasant, accompanied by husband, mildly tearful when dicussing goal of returning to work    Patient is accompained by: Family member    Currently in Pain? No/denies              SLP Evaluation [facility] - 02/11/21 1123      SLP Visit Information   SLP Received On 02/11/21    Referring Provider (SLP) Marlowe Shores, [place]    Onset Date 12/02/2020    Medical Diagnosis Anoxic Brain Injury      General Information   HPI Patient is a 55 year old right-handed female who presented to [facility] 12/02/2020 following an unwitnessed cardiac arrest. Total downtime was unknown.  On  arrival to the ED her GCS was 3.  She required intubation for airway protection.  Hospital course tracheostomy and gastrostomy tube placed on 12/13/2020.   Neurology follow-up for suspect anoxic brain injury as findings on repeat MRI on 12/23/2020 were consistent with anoxic brain injury.  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 Evolutionary changes likely related to evolving ischemia compared with prior MRI of 12/07/2020.  Pt transferred to [facility] on 01/10/2021 and was discharged on 02/08/2021. While at [facility], pt was decannulated and advanced to a dysphagia 2 diet with thin liquids and medicine crushed in puree.    Behavioral/Cognition appropriate    Mobility Status ambulatory      Prior Functional Status   Cognitive/Linguistic Baseline Within functional limits    Type of Home House     Lives With Spouse;Family    Available Support Family    Vocation Full time employment      Cognition   Overall Cognitive Status Impaired/Different from baseline    Area of Impairment Attention;Memory;Awareness;Problem solving    Current Attention Level Sustained    Memory Decreased short-term memory    Awareness Emergent    Attention Selective    Selective Attention Impaired    Selective Attention Impairment Verbal complex;Functional complex  Memory Impaired    Memory Impairment Decreased recall of new information;Decreased short term memory    Decreased Short Term Memory Verbal basic;Functional basic    Awareness Impaired    Awareness Impairment Emergent impairment;Anticipatory impairment    Problem Solving Impaired    Problem Solving Impairment Verbal complex;Functional complex    Executive Function --   all deficits impaired  d/t lower level deficits     Auditory Comprehension   Overall Auditory Comprehension Appears within functional limits for tasks assessed      Visual Recognition/Discrimination   Discrimination Within Function Limits       Reading Comprehension   Reading Status Not tested      Expression   Primary Mode of Expression Verbal      Verbal Expression   Overall Verbal Expression Appears within functional limits for tasks assessed    Non-Verbal Means of Communication Not applicable      Written Expression   Dominant Hand Right    Written Expression Not tested      Oral Motor/Sensory Function   Overall Oral Motor/Sensory Function Appears within functional limits for tasks assessed      Motor Speech   Overall Motor Speech Impaired    Respiration Within functional limits    Level of Impairment Phrase    Phonation Low vocal intensity;Breathy    Resonance Within functional limits    Intelligibility Intelligibility reduced    Word 75-100% accurate    Phrase 50-74% accurate    Sentence 50-74% accurate    Conversation Not tested    Motor Planning Witnin functional limits    Motor Speech Errors Not applicable    Effective Techniques Increased vocal intensity;Slow rate;Over-articulate    Phonation [facility]                           SLP Education - 02/11/21 1142    Education Details dysphagia 2 and dysphagia 3 diet items    Person(s) Educated Patient;Spouse    Methods Explanation;Demonstration;Verbal cues;Handout    Comprehension Verbalized understanding;Returned demonstration            SLP Short Term Goals - 02/11/21 1156      SLP SHORT TERM GOAL #1   Title With moderate cues, pt will demonstrate selective attention in mildly distracting environment for 15 minutes.    Baseline sustained attention    Time 10    Period --   sessions   Status New      SLP SHORT TERM GOAL #2   Title With minimal cues, pt will utilize speech intelligibility strategies to achieve > 90% intelligibility at the phrase level.    Baseline moderate assistance to achieve ~ 50% intelligibility at phrase level    Time 10    Period --   sessions   Status New      SLP SHORT TERM GOAL #3   Title With minimal  cues, pt will utilize external memory aids for recall of family members' names and ages with 95% accuracy.    Baseline 50% accurate    Time 10    Period --   sessions   Status New      SLP SHORT TERM GOAL #4   Title With minimal assistance, pt will demonstrate functional problem solving for basic and familiar tasks to achieve >95% accuracy.    Baseline moderate cues    Time 10    Period --   sessions   Status New  SLP SHORT TERM GOAL #5   Title Pt will consume current diet without any reports s/s of dysphagia by pt or family members.    Baseline upgraded to dysphagia 3 diet on 02/11/2021    Time 10    Period --   session   Status New            SLP Long Term Goals - 02/11/21 1202      SLP LONG TERM GOAL #1   Title Pt will consume safest, least restrictive diet without overt s/s of dysphagia, aspiration or respiratory distress.    Baseline upgraded to dysphagia 3 diet on 02/11/2021    Time 12    Period Weeks    Status New    Target Date 05/06/21      SLP LONG TERM GOAL #2   Title Pt will independently complete functional problem solving for basic and familiar tasks.    Baseline moderate assistance    Time 12    Period Weeks    Status New    Target Date 05/06/21      SLP LONG TERM GOAL #3   Title Pt will demonstrate selective attention in a moderately distracting environment for 15 minutes with < 1 redirection to task.    Baseline sustained attention    Time 12    Period Weeks    Status New    Target Date 05/06/21      SLP LONG TERM GOAL #4   Title With minimal cues, pt will increase speech intelligibility to > 75% at the sentence level.    Baseline 50% at the phrase level    Time 12    Period Weeks    Status New    Target Date 05/06/21            Plan - 02/11/21 1143    Clinical Impression Statement Pt presents to Outpatient ST services for comprehensive assessment of speech production, cognition and dysphagia.       Speech Production   Pt presents  with moderate deficits in speech production. Pt's speech is c/b decreased vocal intensity and imprecise articulation that results in ~ 50% intelligibility at the phrase level. When providing information beyond phrase level, it is difficult to discern distinguishable words. At this time, she is unable to communicate wants and needs d/t her decreased speech intelligibility.       Cognition   Pt presents with moderate cognitive impairments c/b deficits in memory (retrieval and recall), selective attention, basic functional problem solving and emergent awareness of deficits. As a result, pt is not able to accurately states names and DOB of her children and relies on her husband for correct information. Her cognitive impairments prevent her from completing household, personal and work related tasks.       Dysphagia   Per pt and her husband, she has been continuing to consume pureed food since discharge from [facility]. He states that they were doing that out of an abundance of caution. As a result, a clinical swallow evaluation was also completed. Pt demonstrate adequate oropharyngeal abilities when consuming peanut butter graham crackers, granola bar, applesauce and thin liquids via straw. At this time, pt is appropriate for diet advancement to mechanical soft diet. Handout provided to pt and her husband on food items that would be appropriate.   Skilled Outpatient ST intervention is required to target the above mentioned deficits thereby increasing pt's functional independence and reduce caregiver burden.    Speech Therapy Frequency 2x /  week    Duration 12 weeks    Treatment/Interventions Diet toleration management by SLP;Cognitive reorganization;Compensatory techniques;Internal/external aids;SLP instruction and feedback;Patient/family education;Functional tasks;Compensatory strategies    Potential to Achieve Goals Good    SLP Home Exercise Plan provided    Consulted and Agree with Plan of Care Patient;Family  member/caregiver    Family Member Consulted pt's husband           Patient will benefit from skilled therapeutic intervention in order to improve the following deficits and impairments:   Cognitive communication deficit  Dysphagia, oropharyngeal phase  Dysarthria and anarthria  Anoxic brain injury [facility])    Problem List Patient Active Problem List   Diagnosis Date Noted  . Dysarthria and anarthria 02/11/2021  . Sinus tachycardia   . Labile blood glucose   . Dysphagia   . Seizure prophylaxis   . Malnutrition of moderate degree 01/23/2021  . Anoxic brain injury ([place]) 01/10/2021  . Prediabetes   . Dyslipidemia   . S/P percutaneous endoscopic gastrostomy (PEG) tube placement ([place])   . Status post tracheostomy ([place])   . Aspiration pneumonia of both lower lobes due to gastric secretions ([place]) 01/05/2021  . Acute respiratory failure ([place])   . Hypoxic ischemic encephalopathy   . Endotracheal tube present   . Cardiac arrest ([place]) 12/02/2020  . Essential hypertension 12/09/2011   Ilyssa Grennan B. Rutherford Nail M.S., CCC-SLP, [place] Pathologist Rehabilitation Services Office [phone]  Stormy Fabian 02/11/2021, 12:10 PM  [place] MAIN [facility] SERVICES [address] [place], [place], [address] Phone: [phone]   Fax:  [phone]  Name: [name] [name] MRN: [id number] Date of Birth: 10/15/1966

## 2021-02-12 ENCOUNTER — Telehealth: Payer: Self-pay | Admitting: Registered Nurse

## 2021-02-12 NOTE — Telephone Encounter (Signed)
Transitional Care call Transitional Questions answered by Mr. And Mrs Gauer   Patient name: SHACOYA BURKHAMMER DOB: 1966/07/16 1. Are you/is patient experiencing any problems since coming home? No a. Are there any questions regarding any aspect of care? No 2. Are there any questions regarding medications administration/dosing? No a. Are meds being taken as prescribed? Yes b. "Patient should review meds with caller to confirm" Medication List Reviewed 3. Have there been any falls? No 4. Has Home Health been to the house and/or have they contacted you? [facility] a. If not, have you tried to contact them? NA b. Can we help you contact them? NA 5. Are bowels and bladder emptying properly? Yes a. Are there any unexpected incontinence issues? No b. If applicable, is patient following bowel/bladder programs? NA 6. Any fevers, problems with breathing, unexpected pain? No 7. Are there any skin problems or new areas of breakdown? No 8. Has the patient/family member arranged specialty MD follow up (ie cardiology/neurology/renal/surgical/etc.)?  Mr. Wroblewski is scheduling [place] appointments. a. Can we help arrange? No 9. Does the patient need any other services or support that we can help arrange? No 10. Are caregivers following through as expected in assisting the patient? Yes 11. Has the patient quit smoking, drinking alcohol, or using drugs as recommended? (                        )  Appointment date/time 02/21/2021  arrival time 11:00 for 11:20 appointment with Bayard Hugger ANP-C. At [place]

## 2021-02-13 ENCOUNTER — Ambulatory Visit: Payer: Self-pay

## 2021-02-13 ENCOUNTER — Ambulatory Visit: Payer: Self-pay | Admitting: Occupational Therapy

## 2021-02-13 ENCOUNTER — Encounter: Payer: Self-pay | Admitting: Occupational Therapy

## 2021-02-13 ENCOUNTER — Other Ambulatory Visit: Payer: Self-pay

## 2021-02-13 VITALS — BP 119/73 | HR 75

## 2021-02-13 DIAGNOSIS — G931 Anoxic brain damage, not elsewhere classified: Secondary | ICD-10-CM

## 2021-02-13 DIAGNOSIS — R278 Other lack of coordination: Secondary | ICD-10-CM

## 2021-02-13 DIAGNOSIS — M6281 Muscle weakness (generalized): Secondary | ICD-10-CM

## 2021-02-13 NOTE — Therapy (Signed)
[place] MAIN [facility] SERVICES [address] [place], [place], [address] Phone: [phone]   Fax:  [phone]  Occupational Therapy Treatment  Patient Details  Name: GLYN ZENDEJAS MRN: [id number] Date of Birth: 1966/07/26 No data recorded  Encounter Date: 02/13/2021   OT End of Session - 02/13/21 1457    Visit Number 2    Number of Visits 24    Date for OT Re-Evaluation 05/13/21    Authorization Type Progress reports period starting 02/11/2021    OT Start Time 1300    OT Stop Time 1345    OT Time Calculation (min) 45 min    Activity Tolerance Patient tolerated treatment well    Behavior During Therapy [facility] for tasks assessed/performed           Past Medical History:  Diagnosis Date  . Hypertension     Past Surgical History:  Procedure Laterality Date  . PEG PLACEMENT N/A 12/13/2020   Procedure: PERCUTANEOUS ENDOSCOPIC GASTROSTOMY (PEG) PLACEMENT;  Surgeon: Lesly Rubenstein, MD;  Location: [facility] ENDOSCOPY;  Service: Endoscopy;  Laterality: N/A;  . TRACHEOSTOMY TUBE PLACEMENT N/A 12/13/2020   Procedure: TRACHEOSTOMY;  Surgeon: Clyde Canterbury, MD;  Location: [facility] ORS;  Service: ENT;  Laterality: N/A;    There were no vitals filed for this visit.   Subjective Assessment - 02/13/21 1456    Subjective  Pt. was present with her husband    Patient is accompanied by: Family member    Pertinent History Pt. is a 55 y.o. female who went into cardiac arrest, and was admitted to [facility] on 12/02/2020. Pt. was diagnosed with Anoxic Brain Injury following the Cardiac Arrest. When medically stable, the pt. was tranferred to [place] rehab. The patient was discharged home on 02/08/2021, and is ready to begin outpatient OT services. Pt. was independent, and working prior to the onset. Pt. Has very supportive family. Pt. Enjoys gardening.    Currently in Pain? No/denies          OT TREATMENT    Neuro muscular re-education:  Pt. worked  on grasping coins. Pt. had difficulty initially picking them up from the tabletop surface. Pt. worked on using her right hand to slide the larger coins quarters, and nickels off of an elevated surface with isolated 2nd digit to her thumb.  Pt. worked on storing the coins in her palm while grasping them with her 2nd digit, and thumb.  Therapeutic Exercise:  Pt. worked on pink thearputty ex. For hand strengthening. Exercises included: gross gripping, gross digit extension, thumb abduction, lateral, and 3pt. Pinch strengthening, digit abduction, and thumb opposition. Pt. was provided with a visual handout HEP through [facility]. Education was provided to the pt., and the husband about the proper cues, and proper hand position of the exercises.  Pt.'s husband reports that they have been working daily with her at home. 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 Pt.was able to begin grasping, and storing large coins securely, however smaller coins slid through the ulnar aspect of her hand. Pt. continues to work on improving RUE ROM, strength, and [place] skills in order to work towards improving RUE engagement during daily tasks, and maximizing independence with ADLs, and IADLs.  OT Education - 02/13/21 1457    Education Details OT services, POC, goals.    Person(s) Educated Patient;Spouse    Methods Explanation;Demonstration    Comprehension Verbalized understanding;Returned demonstration               OT Long Term Goals - 02/11/21 1459      OT LONG TERM GOAL #1   Title Pt. will be able to independently sign her name with 100% legibility.    Baseline Eval: Signature not legible, however was able to sign on the line with no deviation above, or below the line.    Time 12    Period Weeks    Status New     Target Date 05/13/21      OT LONG TERM GOAL #2   Title Pt. will increase right shoulder AROM to be able to independently retrieve items from closets, and cabinets.    Baseline Eval: Pt. presents with limited ROM.    Time 12    Period Weeks    Status New    Target Date 05/13/21      OT LONG TERM GOAL #3   Title Pt. will increase right grip strength by 5# to be able hold a full glass    Baseline Eval: limited grip strength. Limited bilateral digit flexion, unable to make a full composite fist.    Time 12    Period Weeks    Status New    Target Date 05/13/21      OT LONG TERM GOAL #4   Title Pt. will independently access a computer, and type a one sentence email correspondence.    Baseline Eval: Pt. is unable to to perform    Time 12    Period Weeks    Status New    Target Date 05/13/21      OT LONG TERM GOAL #5   Title Pt. will improve bilateral [facility] skills to be able to grasp, and manipulate small objects during ADLs.    Baseline Eval: [facility]. [facility] skills with difficulty picking,a nd manipulating small objects.    Time 12    Period Weeks    Status New    Target Date 05/13/21      OT LONG TERM GOAL #6   Title Pt. will improve UE strength by 2 mm grades to assist with ADLs, and IADLS,    Baseline Eval: pt. has difficulty performing.    Time 12    Period Weeks    Status New    Target Date 05/13/21      OT LONG TERM GOAL #7   Title Pt. will complete light meal preparation with minA    Baseline Eval: Pt. is unable to prepare light meal preparation.    Time 12    Period Weeks    Status New    Target Date 05/13/21      OT LONG TERM GOAL #8   Title Pt. will perform light home management tasks with minA    Baseline Eval: pt. is unable to perform    Time 12    Period Weeks    Status New    Target Date 05/13/21                 Plan - 02/13/21 1457    Clinical Impression Statement Pt.'s husband reports that they have been working daily with her at  home. 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 Pt.was able to begin grasping, and storing large coins securely, however smaller coins slid through the ulnar aspect of her hand. Pt. continues to work on improving RUE ROM, strength, and [place] skills in order to work towards improving RUE engagement during daily tasks, and maximizing independence with ADLs, and IADLs.   OT Occupational Profile and History Problem Focused Assessment - Including review of records relating to presenting problem    Occupational performance deficits (Please refer to evaluation for details): ADL's;IADL's    Body Structure / Function / Physical Skills ADL;IADL;FMC;ROM;Strength    Rehab Potential Good    Clinical Decision Making Several treatment options, min-mod task modification necessary    Comorbidities Affecting Occupational Performance: May have comorbidities impacting occupational performance    Modification or Assistance to Complete Evaluation  Min-Moderate modification of tasks or assist with assess necessary to complete eval    OT Frequency 2x / week    OT Duration 12 weeks    OT Treatment/Interventions Self-care/ADL training;Neuromuscular education;Therapeutic activities;Patient/family education;DME and/or AE instruction;Visual/perceptual remediation/compensation    Consulted and Agree with Plan of Care Patient           Patient will benefit from skilled therapeutic intervention in order to improve the following deficits and impairments:   Body Structure / Function / Physical Skills: ADL,IADL,FMC,ROM,Strength       Visit Diagnosis: Muscle weakness (generalized)  Other lack of coordination    Problem List Patient Active Problem List   Diagnosis Date Noted  . Dysarthria and anarthria 02/11/2021  . Sinus tachycardia   . Labile blood glucose   . Dysphagia   .  Seizure prophylaxis   . Malnutrition of moderate degree 01/23/2021  . Anoxic brain injury ([place]) 01/10/2021  . Prediabetes   . Dyslipidemia   . S/P percutaneous endoscopic gastrostomy (PEG) tube placement ([place])   . Status post tracheostomy ([place])   . Aspiration pneumonia of both lower lobes due to gastric secretions ([place]) 01/05/2021  . Acute respiratory failure ([place])   . Hypoxic ischemic encephalopathy   . Endotracheal tube present   . Cardiac arrest ([place]) 12/02/2020  . Essential hypertension 12/09/2011    Harrel Carina, MS, OTR/L 02/13/2021, 3:00 PM  [place] MAIN [facility] SERVICES [address] [place], [place], [address] Phone: [phone]   Fax:  [phone]  Name: ANEITA KIGER MRN: [id number] Date of Birth: Oct 21, 1966

## 2021-02-13 NOTE — Therapy (Signed)
[place] MAIN [facility] SERVICES [address] [place], [place], [address] Phone: [phone]   Fax:  [phone]  Physical Therapy Evaluation  Patient Details  Name: [name] [name] MRN: [id number] Date of Birth: September 26, 1966 Referring Provider (PT): D. [place]- PA-C   Encounter Date: 02/13/2021   PT End of Session - 02/13/21 1436    Visit Number 1    Number of Visits 25    Authorization Type Initial Cert 7/61/6073- 06/01/6268    PT Start Time 1348    PT Stop Time 4854    PT Time Calculation (min) 57 min    Equipment Utilized During Treatment Gait belt    Activity Tolerance Patient tolerated treatment well    Behavior During Therapy WFL for tasks assessed/performed           Past Medical History:  Diagnosis Date   Hypertension     Past Surgical History:  Procedure Laterality Date   PEG PLACEMENT N/A 12/13/2020   Procedure: PERCUTANEOUS ENDOSCOPIC GASTROSTOMY (PEG) PLACEMENT;  Surgeon: Lesly Rubenstein, MD;  Location: [facility] ENDOSCOPY;  Service: Endoscopy;  Laterality: N/A;   TRACHEOSTOMY TUBE PLACEMENT N/A 12/13/2020   Procedure: TRACHEOSTOMY;  Surgeon: Clyde Canterbury, MD;  Location: [facility] ORS;  Service: ENT;  Laterality: N/A;    Vitals:   02/13/21 2152  BP: 119/73  Pulse: 75  SpO2: 98%      Subjective Assessment - 02/13/21 2154    Subjective Patient reports having cardiac arrest in January but doesn't remember the event. Husband reports this happened in her sleep. She experienced an Anoxic Brain injury and went to inpatient rehab form 2/18-3/19. She reports she is happy to be home and moving around better. Most difficulty with right hand and her speech.    Patient is accompained by: Family member   Husband   Pertinent History Patient experienced a Cardiac arrest on 12/02/2020 with subsequent anoxic brain injury requiring intubation.    Limitations Lifting;Standing;Walking;Writing;House hold activities    How long can you sit  comfortably? No issues    How long can you stand comfortably? Limited due to fatigue.    How long can you walk comfortably? Limited secondary to fatigue.    Patient Stated Goals Improve my balance and mobility back to independent    Currently in Pain? No/denies              [facility] PT Assessment - 02/13/21 1400      Assessment   Medical Diagnosis anoxic brain Injury    Referring Provider (PT) D. [place]- PA-C    Onset Date/Surgical Date 12/02/20    Hand Dominance Right    Next MD Visit 02/21/2021    Prior Therapy [facility]- 2/18-3/19      Precautions   Precautions Fall      Restrictions   Weight Bearing Restrictions No      Balance Screen   Has the patient fallen in the past 6 months No    Has the patient had a decrease in activity level because of a fear of falling?  Yes    Is the patient reluctant to leave their home because of a fear of falling?  Yes      [place] Private residence    Living Arrangements Spouse/significant other   55 year daughter, 54 year son   Available Help at Discharge Family   private nurse   Type of [place] to  enter    Entrance Stairs-Number of Steps 4    Entrance Stairs-Rails Can reach both    Home Layout Two level   Stays on basement level     Prior Function   Level of Independence Independent    Vocation Full time employment    Vocation Requirements Trucking    Leisure Flowers      Cognition   Overall Cognitive Status Impaired/Different from baseline    Area of Impairment Attention;Memory;Awareness;Problem solving    Current Attention Level Sustained    Memory Decreased short-term memory    Awareness Emergent    Problem Solving Difficulty sequencing    Attention Selective    Selective Attention Impaired    Selective Attention Impairment Verbal complex;Functional complex    Memory Impaired    Memory Impairment Decreased recall of new information;Decreased short term memory     Decreased Short Term Memory Verbal basic;Functional basic    Awareness Impaired    Awareness Impairment Emergent impairment;Anticipatory impairment    Problem Solving Impaired    Problem Solving Impairment Verbal complex;Functional complex      Observation/Other Assessments   Skin Integrity Intact to light touch    Focus on Therapeutic Outcomes (FOTO)  69      Coordination   Finger Nose Finger Test dysmetria    Heel Shin Test unable to follow commands to complete            OBJECTIVE  MUSCULOSKELETAL: Tremor: Absent Bulk: Normal Tone: Normal, no clonus  Posture No gross abnormalities noted in standing or seated posture  Gait Patient ambulated in gym without an assistive device exhibited some difficulty with coordination with mild unsteadiness yet no loss of balance around 120 feet.  Strength R/L 4/5 Hip flexion 4/5 Hip external rotation 4/5 Hip internal rotation 4/5 Hip extension  4/5 Hip abduction 4/5 Hip adduction 4/5 Knee extension 4/5 Knee flexion 4/5 Ankle Plantarflexion 4/5 Ankle Dorsiflexion   NEUROLOGICAL:   Sensation Grossly intact to light touch bilateral UEs/LEs as determined by testing dermatomes C2-T2/L2-S2 respectively Proprioception and hot/cold testing deferred on this date       FUNCTIONAL OUTCOME MEASURES   Results Comments          FGA 8/30   TUG 9.85 seconds No device  5TSTS  15 seconds No UE support  6 Minute Walk Test    10 Meter Gait Speed Self-selected: s = 0.9 m/s Below normative values for full community ambulation              ASSESSMENT Clinical Impression: Pt is a pleasant 55 year-old female/female referred with diagnosis of anoxic brain injury. PT examination reveals deficits including LE weakness, difficulty walking and impaired coordination/balance. Patient presents with 4/5 Right LE strength and able to walk without an assistive device however difficulty coordinating movements and impairments evident in  functional gait assessment scoring an 8/30.   Pt will benefit from skilled PT services to address deficits in strength and balance to decrease risk for future falls and improve overall quality of life.                      Objective measurements completed on examination: See above findings.                 PT Short Term Goals - 02/13/21 2239      PT SHORT TERM GOAL #1   Title Pt will be independent with HEP in order to improve strength and balance in  order to decrease fall risk and improve function at home and work.    Baseline 02/13/2021- Patient has no formal HEP in place.    Time 6    Period Weeks    Status New    Target Date 03/27/21             PT Long Term Goals - 02/13/21 2237      PT LONG TERM GOAL #1   Title Pt will improve FOTO to target score to 77 to display perceived improvements in ability to complete ADL's.    Baseline 02/13/2021= FOTO score-69    Time 12    Period Weeks    Status New    Target Date 05/08/21      PT LONG TERM GOAL #2   Title Pt will decrease 5TSTS by at least 3 seconds in order to demonstrate clinically significant improvement in LE strength.    Baseline 02/13/2021= 15.0 sec without UE support    Time 12    Period Weeks    Status New    Target Date 05/08/21      PT LONG TERM GOAL #3   Title Patient will increase RLE gross strength to 4+/5 as to improve functional strength for independent gait, increased standing tolerance and increased ADL ability.    Baseline 02/13/2021- Patient presents with 4/5 right Hip flex/knee flex/ext, ankle DF    Time 12    Period Weeks    Status New    Target Date 05/08/21      PT LONG TERM GOAL #4   Title Patient will be modified independent in walking on even/uneven surface with least restrictive assistive device, for 10+ minutes without rest break, reporting some difficulty or less to improve walking tolerance with community ambulation including grocery shopping, going to  [place] 02/13/2021- Patient presents with limited ambulation around 120 feet with decreased coordination and fatigue.    Time 12    Period Weeks    Status New    Target Date 05/08/21      PT LONG TERM GOAL #5   Title Patient will increase Functional Gait Assessment score to >15 /30 as to reduce fall risk and improve dynamic gait safety with community ambulation.    Baseline 02/13/2021: FGA= 8/20    Time 12    Period Weeks    Status New    Target Date 05/08/21                  Plan - 02/14/21 1009    Clinical Impression Statement Pt is a pleasant 55 year-old female/female referred with diagnosis of anoxic brain injury. PT examination reveals deficits including LE weakness, difficulty walking and impaired coordination/balance. Patient presents with 4/5 Right LE strength and able to walk without an assistive device however difficulty coordinating movements and impairments evident in functional gait assessment scoring an 8/30.   Pt will benefit from skilled PT services to address deficits in strength and balance to decrease risk for future falls and improve overall quality of life.    Personal Factors and Comorbidities Comorbidity 3+    Comorbidities Anoxic brain injury, Sinus tachycardia, Dysphagia    Examination-Activity Limitations Caring for Others;Carry;Lift;Squat;Stairs    Examination-Participation Restrictions Cleaning;Community Activity;Driving;Laundry;Occupation;Personal Finances;Yard Work    Stability/Clinical Decision Making Stable/Uncomplicated    Clinical Decision Making Moderate    Rehab Potential Good    PT Frequency 2x / week    PT Duration 12 weeks    PT Treatment/Interventions  ADLs/Self Care Home Management;Cryotherapy;Moist Heat;DME Instruction;Gait training;Stair training;Functional mobility training;Therapeutic activities;Therapeutic exercise;Balance training;Neuromuscular re-education;Cognitive remediation;Patient/family education    PT Next Visit  Plan Initiate LE strengthening, Coordination/balance activities.    PT Home Exercise Plan To initiate next 1-2 visits    Consulted and Agree with Plan of Care Patient;Family member/caregiver    Family Member Consulted Patient husband           Patient will benefit from skilled therapeutic intervention in order to improve the following deficits and impairments:  Abnormal gait,Decreased activity tolerance,Decreased balance,Decreased cognition,Decreased coordination,Decreased endurance,Decreased mobility,Difficulty walking,Decreased strength  Visit Diagnosis: Muscle weakness (generalized)  Other lack of coordination  Anoxic brain injury [facility])     Problem List Patient Active Problem List   Diagnosis Date Noted   Dysarthria and anarthria 02/11/2021   Sinus tachycardia    Labile blood glucose    Dysphagia    Seizure prophylaxis    Malnutrition of moderate degree 01/23/2021   Anoxic brain injury ([place]) 01/10/2021   Prediabetes    Dyslipidemia    S/P percutaneous endoscopic gastrostomy (PEG) tube placement [facility])    Status post tracheostomy ([place])    Aspiration pneumonia of both lower lobes due to gastric secretions ([place]) 01/05/2021   Acute respiratory failure ([place])    Hypoxic ischemic encephalopathy    Endotracheal tube present    Cardiac arrest ([place]) 12/02/2020   Essential hypertension 12/09/2011    Lewis Moccasin, PT 02/14/2021, 10:17 AM  [place], [place], [address] Phone: [phone]   Fax:  [phone]  Name: MARIAGUADALUPE FIALKOWSKI MRN: [id number] Date of Birth: [birth year]

## 2021-02-13 NOTE — Therapy (Incomplete)
[place] MAIN [facility] SERVICES [address] [place], [place], [address] Phone: [phone]   Fax:  [phone]  Physical Therapy Evaluation  Patient Details  Name: [name] [name] MRN: [id number] Date of Birth: [birth year] Referring Provider (PT): D. Tieton- PA-C   Encounter Date: 02/13/2021   PT End of Session - 02/13/21 1436    Visit Number 1    Number of Visits 25    PT Start Time 8101    PT Stop Time 1445    PT Time Calculation (min) 57 min    Equipment Utilized During Treatment Gait belt    Activity Tolerance Patient tolerated treatment well    Behavior During Therapy WFL for tasks assessed/performed           Past Medical History:  Diagnosis Date  . Hypertension     Past Surgical History:  Procedure Laterality Date  . PEG PLACEMENT N/A 12/13/2020   Procedure: PERCUTANEOUS ENDOSCOPIC GASTROSTOMY (PEG) PLACEMENT;  Surgeon: Lesly Rubenstein, MD;  Location: [facility] ENDOSCOPY;  Service: Endoscopy;  Laterality: N/A;  . TRACHEOSTOMY TUBE PLACEMENT N/A 12/13/2020   Procedure: TRACHEOSTOMY;  Surgeon: Clyde Canterbury, MD;  Location: [facility] ORS;  Service: ENT;  Laterality: N/A;    Vitals:   02/13/21 2152  BP: 119/73  Pulse: 75  SpO2: 98%      Subjective Assessment - 02/13/21 2154    Subjective Patient reports having cardiac arrest in January.    Limitations Lifting;Standing;Walking;Writing;House hold activities    How long can you sit comfortably? No issues    How long can you stand comfortably? Limited due to fatigue.    How long can you walk comfortably? Limited secondary to fatigue.    Patient Stated Goals Improve my balance and mobility back to independent    Currently in Pain? No/denies              [facility] PT Assessment - 02/13/21 1400      Assessment   Medical Diagnosis anoxic brain Injury    Referring Provider (PT) D. Rigby- PA-C    Onset Date/Surgical Date 12/02/20    Hand Dominance Right    Next MD Visit  02/21/2021    Prior Therapy [facility]- 2/18-3/19      Precautions   Precautions Fall      Restrictions   Weight Bearing Restrictions No      Balance Screen   Has the patient fallen in the past 6 months No    Has the patient had a decrease in activity level because of a fear of falling?  Yes    Is the patient reluctant to leave their home because of a fear of falling?  Yes      [place] Private residence    Living Arrangements Spouse/significant other   44 year daughter, 39 year son   Available Help at Discharge Family   private nurse   Type of [place] to enter    Entrance Stairs-Number of Steps 4    Entrance Stairs-Rails Can reach both    [place] Two level   Stays on basement level     Prior Function   Level of [place] Full time employment    Vocation Requirements Trucking    Leisure Flowers      Cognition   Overall Cognitive Status Impaired/Different from baseline    Area of Impairment Attention;Memory;Awareness;Problem solving  Current Attention Level Sustained    Memory Decreased short-term memory    Awareness Emergent    Problem Solving Difficulty sequencing    Attention Selective    Selective Attention Impaired    Selective Attention Impairment Verbal complex;Functional complex    Memory Impaired    Memory Impairment Decreased recall of new information;Decreased short term memory    Decreased Short Term Memory Verbal basic;Functional basic    Awareness Impaired    Awareness Impairment Emergent impairment;Anticipatory impairment    Problem Solving Impaired    Problem Solving Impairment Verbal complex;Functional complex      Observation/Other Assessments   Skin Integrity Intact to light touch    Focus on Therapeutic Outcomes (FOTO)  69      Coordination   Finger Nose Finger Test dysmetria    Heel Shin Test unable to follow commands to complete             OBJECTIVE  MUSCULOSKELETAL: Tremor: Absent Bulk: Normal Tone: Normal, no clonus  Posture No gross abnormalities noted in standing or seated posture  Gait Patient ambulated in gym without an assistive device exhibited some difficulty with coordination with mild unsteadiness yet no loss of balance around 120 feet.  Strength R/L 4/5 Hip flexion 4/5 Hip external rotation 4/5 Hip internal rotation 4/5 Hip extension  4/5 Hip abduction 4/5 Hip adduction 4/5 Knee extension 4/5 Knee flexion 4/5 Ankle Plantarflexion 4/5 Ankle Dorsiflexion   NEUROLOGICAL:   Sensation Grossly intact to light touch bilateral UEs/LEs as determined by testing dermatomes C2-T2/L2-S2 respectively Proprioception and hot/cold testing deferred on this date       FUNCTIONAL OUTCOME MEASURES   Results Comments          FGA 8/30   TUG 9.85 seconds No device  5TSTS  15 seconds No UE support  6 Minute Walk Test    10 Meter Gait Speed Self-selected: s = 0.9 m/s Below normative values for full community ambulation              ASSESSMENT Clinical Impression: Pt is a pleasant 55 year-old female/female referred with diagnosis of anoxic brain injury. PT examination reveals deficits . Pt presents with deficits in strength, gait and balance. Pt will benefit from skilled PT services to address deficits in balance and decrease risk for future falls.    PLAN Next Visit: HEP:                    Objective measurements completed on examination: See above findings.                 PT Short Term Goals - 02/13/21 2239      PT SHORT TERM GOAL #1   Title Pt will be independent with HEP in order to improve strength and balance in order to decrease fall risk and improve function at home and work.    Baseline 02/13/2021- Patient has no formal HEP in place.    Time 6    Period Weeks    Status New    Target Date 03/27/21             PT Long Term Goals - 02/13/21 2237       PT LONG TERM GOAL #1   Title Pt will improve FOTO to target score to 77 to display perceived improvements in ability to complete ADL's.    Baseline 02/13/2021= FOTO score-69    Time 12    Period Weeks    Status New  Target Date 05/08/21      PT LONG TERM GOAL #2   Title Pt will decrease 5TSTS by at least 3 seconds in order to demonstrate clinically significant improvement in LE strength.    Baseline 02/13/2021= 15.0 sec without UE support    Time 12    Period Weeks    Status New    Target Date 05/08/21      PT LONG TERM GOAL #3   Title Patient will increase RLE gross strength to 4+/5 as to improve functional strength for independent gait, increased standing tolerance and increased ADL ability.    Baseline 02/13/2021- Patient presents with 4/5 right Hip flex/knee flex/ext, ankle DF    Time 12    Period Weeks    Status New    Target Date 05/08/21      PT LONG TERM GOAL #4   Title Patient will be modified independent in walking on even/uneven surface with least restrictive assistive device, for 10+ minutes without rest break, reporting some difficulty or less to improve walking tolerance with community ambulation including grocery shopping, going to [place] 02/13/2021- Patient presents with limited ambulation around 120 feet with decreased coordination and fatigue.    Time 12    Period Weeks    Status New    Target Date 05/08/21      PT LONG TERM GOAL #5   Title Patient will increase Functional Gait Assessment score to >15 /30 as to reduce fall risk and improve dynamic gait safety with community ambulation.    Baseline 02/13/2021: FGA= 8/20    Time 12    Period Weeks    Status New    Target Date 05/08/21                   Patient will benefit from skilled therapeutic intervention in order to improve the following deficits and impairments:     Visit Diagnosis: Muscle weakness (generalized)  Other lack of coordination  Anoxic brain injury  [facility])     Problem List Patient Active Problem List   Diagnosis Date Noted  . Dysarthria and anarthria 02/11/2021  . Sinus tachycardia   . Labile blood glucose   . Dysphagia   . Seizure prophylaxis   . Malnutrition of moderate degree 01/23/2021  . Anoxic brain injury ([place]) 01/10/2021  . Prediabetes   . Dyslipidemia   . S/P percutaneous endoscopic gastrostomy (PEG) tube placement ([place])   . Status post tracheostomy ([place])   . Aspiration pneumonia of both lower lobes due to gastric secretions ([place]) 01/05/2021  . Acute respiratory failure ([place])   . Hypoxic ischemic encephalopathy   . Endotracheal tube present   . Cardiac arrest ([place]) 12/02/2020  . Essential hypertension 12/09/2011    Lewis Moccasin, PT 02/13/2021, 11:56 PM  [place] MAIN [facility] SERVICES [address] [place], [place], [address] Phone: [phone]   Fax:  [phone]  Name: [name] [name] MRN: [id number] Date of Birth: 03-Dec-1965

## 2021-02-17 ENCOUNTER — Telehealth: Payer: Self-pay

## 2021-02-17 NOTE — Telephone Encounter (Signed)
Attempted to contact patient's husband Darrell to schedule a Palliative Care consult appointment. No answer left a message to return call.

## 2021-02-17 NOTE — Progress Notes (Signed)
Office Visit    Patient Name: MARIENE [name] Date of Encounter: 02/18/2021  PCP:  Marinda Elk, [place]  Cardiologist:  Ida Rogue, MD  Advanced Practice Provider:  No care team member to display Electrophysiologist:  None    Chief Complaint    [name] [name] is a 55 y.o. female with a hx of hypertension, prediabetes, obesity, cardiac arrest with ventricular fibrillation and anoxic brain injury, aspiration pneumonia, anemia, hypokalemia presents today for hospital follow up.   Past Medical History    Past Medical History:  Diagnosis Date  . Hypertension    Past Surgical History:  Procedure Laterality Date  . PEG PLACEMENT N/A 12/13/2020   Procedure: PERCUTANEOUS ENDOSCOPIC GASTROSTOMY (PEG) PLACEMENT;  Surgeon: Lesly Rubenstein, MD;  Location: [facility] ENDOSCOPY;  Service: Endoscopy;  Laterality: N/A;  . TRACHEOSTOMY TUBE PLACEMENT N/A 12/13/2020   Procedure: TRACHEOSTOMY;  Surgeon: Clyde Canterbury, MD;  Location: [facility] ORS;  Service: ENT;  Laterality: N/A;    Allergies  No Known Allergies  History of Present Illness    [name] [name] is a 55 y.o. female with a hx of hypertension, prediabetes, obesity, cardiac arrest with ventricular fibrillation and anoxic brain injury, aspiration pneumonia, anemia, hypokalemia  last seen while hospitalized.  She was admitted 12/02/20 after unwitnessed cardiac event. Last seen one hour prior to being found down at home. Ventricular fibrillation on EMS arrival, one shock/epi and ROSc in field. Admitted and hypothermia protocol started. 12/03/20 rewarming protocol and echo with LVEF 60-65%, no RWMA. She underwent PEG tube and tracheostomy 12/13/20. Completed course of Unasym for aspiration pneumonia. Inpatient ischemic evaluation was deferred due to cognitive and functional status. She was started on Atorvastatin and Metoprolol. She was transferred to inpatient rehab 01/10/21 and discharged on 02/08/21.  Tracheostomy tube was decannulated 01/22/21 without difficulty and stoma healed. PEG tube removed 02/06/21.   She presents today for follow up with her husband.  She has made remarkable progress through therapy and continues to participate with outpatient therapy. Reports no shortness of breath at rest. Tells me she gets dyspnea on exertion only with severe exercise at therapy but this is overall improving.  Reports no chest pain, pressure, or tightness. No edema, orthopnea, PND. Reports no palpitations.  We reviewed her echocardiogram while hospitalized in detail.  We also reviewed indication for ischemic evaluation discussed modalities including stress test, cardiac CTA, cardiac catheterization.  EKGs/Labs/Other Studies Reviewed:   The following studies were reviewed today:  Echo 12/03/20 1. Left ventricular ejection fraction, by estimation, is 60 to 65%. The  left ventricle has normal function. The left ventricle has no regional  wall motion abnormalities. There is mild left ventricular hypertrophy.  Left ventricular diastolic parameters  were normal.   2. Right ventricular systolic function is normal. The right ventricular  size is normal. Tricuspid regurgitation signal is inadequate for assessing  PA pressure.   3. A small pericardial effusion is present. The pericardial effusion is  posterior and lateral to the left ventricle.   4. The mitral valve is grossly normal. No evidence of mitral valve  regurgitation. No evidence of mitral stenosis.   5. The aortic valve is tricuspid. Aortic valve regurgitation is not  visualized. No aortic stenosis is present.   6. The inferior vena cava is normal in size with greater than 50%  respiratory variability, suggesting right atrial pressure of 3 mmHg.   EKG:  EKG is ordered today.  The ekg  ordered today demonstrates NSR 63 bpm with minimal voltage criteria for LVH no acute ST/T wave changes.  Known mild LVH by echo 11/2020.  Recent Labs: 01/09/2021:  Magnesium 1.9; TSH 1.241 01/13/2021: ALT 18 01/27/2021: BUN 10; Creatinine, Ser 0.48; Potassium 3.6; Sodium 139 02/03/2021: Hemoglobin 10.7; Platelets 181  Recent Lipid Panel    Component Value Date/Time   TRIG 78 12/11/2020 1528    Home Medications   Current Meds  Medication Sig  . amLODipine (NORVASC) 10 MG tablet Take 1 tablet (10 mg total) by mouth daily.  Marland Kitchen atorvastatin (LIPITOR) 80 MG tablet Take 1 tablet (80 mg total) by mouth daily.  Marland Kitchen levETIRAcetam (KEPPRA) 750 MG tablet Take 1 tablet (750 mg total) by mouth 2 (two) times daily.  . metoprolol tartrate (LOPRESSOR) 25 MG tablet Take 1 tablet (25 mg total) by mouth 2 (two) times daily.  . potassium chloride (KLOR-CON) 20 MEQ packet Take 20 mEq by mouth at bedtime.  . saccharomyces boulardii (FLORASTOR) 250 MG capsule Take 1 capsule (250 mg total) by mouth 2 (two) times daily.     Review of Systems  All other systems reviewed and are otherwise negative except as noted above.  Physical Exam    VS:  BP 122/72 (BP Location: Left Arm, Patient Position: Sitting, Cuff Size: Normal)   Pulse 63   Ht 5\' 3"  (1.6 m)   Wt 170 lb (77.1 kg)   SpO2 98%   BMI 30.11 kg/m  , BMI Body mass index is 30.11 kg/m.  Wt Readings from Last 3 Encounters:  02/18/21 170 lb (77.1 kg)  02/08/21 177 lb 14.6 oz (80.7 kg)  01/10/21 170 lb 3.1 oz (77.2 kg)    GEN: Well nourished, overweight, well developed, in no acute distress. HEENT: normal. Neck: Supple, no JVD, carotid bruits, or masses. Cardiac: RRR, no murmurs, rubs, or gallops. No clubbing, cyanosis, edema.  Radials/PT 2+ and equal bilaterally.  Respiratory:  Respirations regular and unlabored, clear to auscultation bilaterally. GI: Soft, nontender, nondistended. MS: No deformity or atrophy. Skin: Warm and dry, no rash. Neuro:  Strength and sensation are intact. Psych: Normal affect.  Assessment & Plan    1. HTN - BP well controlled. Continue current antihypertensive regimen.  Lopressor 25  mg twice daily, amlodipine 10 mg daily.  If additional agent needed in the future consider ACE/ARB.  2. Cardiac arrest with ventricular fibrillation and subsequent anoxic brain injury - With remarkable recovery and continuing to work with outpatient therapy for fine motor skills.  Her daughter is a Biomedical scientist and has been preparing for heart healthy meals.  She reports no palpitations, chest pain.  Reports dyspnea on exertion with physical therapy is overall improving.  We discussed indication for ischemic evaluation due to risk factors including V. fib arrest, hypertension, ethnicity, hyperlipidemia.  She was agreeable to proceed with cardiac CTA.  3. HLD - Continue atorvastatin 80 mg daily.  If cardiac CTA shows evidence of coronary atherosclerosis, LDL goal less than 70.  Otherwise, LDL goal less than 100  Disposition: Follow up in 6-8 week(s) with Dr. Rockey Situ or APP   Signed, Loel Dubonnet, NP 02/18/2021, 8:52 AM [place]

## 2021-02-18 ENCOUNTER — Ambulatory Visit: Payer: Self-pay | Admitting: Speech Pathology

## 2021-02-18 ENCOUNTER — Other Ambulatory Visit: Payer: Self-pay

## 2021-02-18 ENCOUNTER — Encounter: Payer: Self-pay | Admitting: Family

## 2021-02-18 ENCOUNTER — Ambulatory Visit (INDEPENDENT_AMBULATORY_CARE_PROVIDER_SITE_OTHER): Payer: Self-pay | Admitting: Family

## 2021-02-18 ENCOUNTER — Ambulatory Visit: Payer: Self-pay | Admitting: Occupational Therapy

## 2021-02-18 ENCOUNTER — Encounter: Payer: Self-pay | Admitting: Occupational Therapy

## 2021-02-18 VITALS — BP 122/72 | HR 63 | Ht 63.0 in | Wt 170.0 lb

## 2021-02-18 DIAGNOSIS — M6281 Muscle weakness (generalized): Secondary | ICD-10-CM

## 2021-02-18 DIAGNOSIS — R41841 Cognitive communication deficit: Secondary | ICD-10-CM

## 2021-02-18 DIAGNOSIS — R278 Other lack of coordination: Secondary | ICD-10-CM

## 2021-02-18 DIAGNOSIS — E782 Mixed hyperlipidemia: Secondary | ICD-10-CM

## 2021-02-18 DIAGNOSIS — I4901 Ventricular fibrillation: Secondary | ICD-10-CM

## 2021-02-18 DIAGNOSIS — G931 Anoxic brain damage, not elsewhere classified: Secondary | ICD-10-CM

## 2021-02-18 DIAGNOSIS — I10 Essential (primary) hypertension: Secondary | ICD-10-CM

## 2021-02-18 DIAGNOSIS — Z8674 Personal history of sudden cardiac arrest: Secondary | ICD-10-CM

## 2021-02-18 MED ORDER — METOPROLOL TARTRATE 100 MG PO TABS: 100.0000 mg | ORAL_TABLET | Freq: Once | ORAL | 0 refills | Status: DC

## 2021-02-18 NOTE — Therapy (Signed)
[place] MAIN [facility] SERVICES [address] [place], [place], [address] Phone: [phone]   Fax:  [phone]  Occupational Therapy Treatment  Patient Details  Name: [name] [name] MRN: [id number] Date of Birth: 07-23-1966 No data recorded  Encounter Date: 02/18/2021   OT End of Session - 02/18/21 1011    Visit Number 3    Number of Visits 24    Date for OT Re-Evaluation 05/13/21    Authorization Type Progress reports period starting 02/11/2021    OT Start Time 1000    OT Stop Time 1047    OT Time Calculation (min) 47 min    Activity Tolerance Patient tolerated treatment well    Behavior During Therapy [facility] for tasks assessed/performed           Past Medical History:  Diagnosis Date  . Hypertension     Past Surgical History:  Procedure Laterality Date  . PEG PLACEMENT N/A 12/13/2020   Procedure: PERCUTANEOUS ENDOSCOPIC GASTROSTOMY (PEG) PLACEMENT;  Surgeon: Lesly Rubenstein, MD;  Location: [facility] ENDOSCOPY;  Service: Endoscopy;  Laterality: N/A;  . TRACHEOSTOMY TUBE PLACEMENT N/A 12/13/2020   Procedure: TRACHEOSTOMY;  Surgeon: Clyde Canterbury, MD;  Location: [facility] ORS;  Service: ENT;  Laterality: N/A;    There were no vitals filed for this visit.   Subjective Assessment - 02/18/21 1009    Subjective  Pt had a cardiology check up today that went well.    Patient is accompanied by: Family member    Pertinent History Pt. is a 55 y.o. female who went into cardiac arrest, and was [facility] to [facility] on 12/02[facility]. Pt. was diagnosed with Anoxic Brain Injury following the Cardiac Arrest. When medically stable, the pt. was tranferred to [place] rehab. The patient was discharged home on 02/08/2021, and is ready to begin outpatient OT services. Pt. was independent, and working prior to the onset. Pt. Has very supportive family. Pt. Enjoys gardening.    Currently in Pain? No/denies              Therapeutic Activities Pt worked on  flipping cards while alternating directions to incorporate thumb on fingers/fingers on  thumb motion in the development of fine motor coordination skills. Worked on progressively increasing the speed while maintaining control. Pt with difficulty discriminating between spades and hearts, cues to attend to colour difference. Pt looks to OT for feedback on face cards stating she is uncertain because they have red and black on them. Pt completed 4x5 puzzle with MAX cues. Pt demonstrates good finger dexterity for puzzle piece manipulation however requires constant cueing to determine proper orientation of pieces. Pt repeatedly attempted to place puzzle piece on top of one another and demonstrated difficulty understanding how pieces fit together. Husband expressed plan to buy a puzzle and incorporate into HEP.             OT Education - 02/18/21 1011    Education Details HEP    Person(s) Educated Patient;Spouse    Methods Explanation;Demonstration    Comprehension Verbalized understanding;Returned demonstration               OT Long Term Goals - 02/11/21 1459      OT LONG TERM GOAL #1   Title Pt. will be able to independently sign her name with 100% legibility.    Baseline Eval: Signature not legible, however was able to sign on the line with no deviation above, or below the line.    Time  12    Period Weeks    Status New    Target Date 05/13/21      OT LONG TERM GOAL #2   Title Pt. will increase right shoulder AROM to be able to independently retrieve items from closets, and cabinets.    Baseline Eval: Pt. presents with limited ROM.    Time 12    Period Weeks    Status New    Target Date 05/13/21      OT LONG TERM GOAL #3   Title Pt. will increase right grip strength by 5# to be able hold a full glass    Baseline Eval: limited grip strength. Limited bilateral digit flexion, unable to make a full composite fist.    Time 12    Period Weeks    Status New    Target Date 05/13/21       OT LONG TERM GOAL #4   Title Pt. will independently access a computer, and type a one sentence email correspondence.    Baseline Eval: Pt. is unable to to perform    Time 12    Period Weeks    Status New    Target Date 05/13/21      OT LONG TERM GOAL #5   Title Pt. will improve bilateral [facility] skills to be able to grasp, and manipulate small objects during ADLs.    Baseline Eval: Pt. presents withlimite [facility] skills with difficulty picking,a nd manipulating small objects.    Time 12    Period Weeks    Status New    Target Date 05/13/21      OT LONG TERM GOAL #6   Title Pt. will improve UE strength by 2 mm grades to assist with ADLs, and IADLS,    Baseline Eval: pt. has difficulty performing.    Time 12    Period Weeks    Status New    Target Date 05/13/21      OT LONG TERM GOAL #7   Title Pt. will complete light meal preparation with minA    Baseline Eval: Pt. is unable to prepare light meal preparation.    Time 12    Period Weeks    Status New    Target Date 05/13/21      OT LONG TERM GOAL #8   Title Pt. will perform light home management tasks with minA    Baseline Eval: pt. is unable to perform    Time 12    Period Weeks    Status New    Target Date 05/13/21                 Plan - 02/18/21 1012    Clinical Impression Statement Pt.'s husband reports that they have been working daily with her at home, plans to buy puzzle to incorporate into HEP. 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 Pt was able to flip cards and manipulate puzzle pieces with good dexterity. Pt continues to work on improving RUE ROM, strength, and [place] skills in order to work towards improving RUE engagement during daily tasks, and maximizing independence with ADLs, and IADLs.    OT Occupational Profile and History Problem Focused Assessment - Including  review of records relating to presenting problem    Occupational performance deficits (Please refer to evaluation for details): ADL's;IADL's    Body Structure / Function /  Physical Skills ADL;IADL;FMC;ROM;Strength    Rehab Potential Good    Clinical Decision Making Several treatment options, min-mod task modification necessary    Comorbidities Affecting Occupational Performance: May have comorbidities impacting occupational performance    Modification or Assistance to Complete Evaluation  Min-Moderate modification of tasks or assist with assess necessary to complete eval    OT Frequency 2x / week    OT Duration 12 weeks    OT Treatment/Interventions Self-care/ADL training;Neuromuscular education;Therapeutic activities;Patient/family education;DME and/or AE instruction;Visual/perceptual remediation/compensation    Consulted and Agree with Plan of Care Patient           Patient will benefit from skilled therapeutic intervention in order to improve the following deficits and impairments:   Body Structure / Function / Physical Skills: ADL,IADL,FMC,ROM,Strength       Visit Diagnosis: Other lack of coordination  Muscle weakness (generalized)    Problem List Patient Active Problem List   Diagnosis Date Noted  . Dysarthria and anarthria 02/11/2021  . Sinus tachycardia   . Labile blood glucose   . Dysphagia   . Seizure prophylaxis   . Malnutrition of moderate degree 01/23/2021  . Anoxic brain injury ([place]) 01/10/2021  . Prediabetes   . Dyslipidemia   . S/P percutaneous endoscopic gastrostomy (PEG) tube placement ([place])   . Status post tracheostomy ([place])   . Aspiration pneumonia of both lower lobes due to gastric secretions ([place]) 01/05/2021  . Acute respiratory failure ([place])   . Hypoxic ischemic encephalopathy   . Endotracheal tube present   . Cardiac arrest ([place]) 12/02/2020  . Essential hypertension 12/09/2011   Dessie Coma, M.S. OTR/L  02/18/21, 11:06 AM  ascom  [phone]  [place] MAIN [facility] SERVICES [address] [place], [place], [address] Phone: [phone]   Fax:  [phone]  Name: MIKAILA GRUNERT MRN: [id number] Date of Birth: Aug 14, 1966

## 2021-02-18 NOTE — Patient Instructions (Addendum)
Medication Instructions:  No medication changes today.   *If you need a refill on your cardiac medications before your next appointment, please call your pharmacy*   Lab Work: None ordered today.  Testing/Procedures: Your physician has requested that you have cardiac CT. Cardiac computed tomography (CT) is a painless test that uses an x-ray machine to take clear, detailed pictures of your heart. Please follow instruction sheet as given.  Your cardiac CT will be scheduled at:  [facility] [address] [place], [place] [address] [phone]  [facility], please arrive 15 mins early for check-in and test prep.  Please follow these instructions carefully (unless otherwise directed):   On the Night Before the Test: . Be sure to Drink plenty of water. . Do not consume any caffeinated/decaffeinated beverages or chocolate 12 hours prior to your test. . Do not take any antihistamines 12 hours prior to your test.   On the Day of the Test: . Drink plenty of water until 1 hour prior to the test. . Do not eat any food 4 hours prior to the test. . You may take your regular medications prior to the test.  . Take metoprolol (Lopressor) two hours prior to test. . HOLD Furosemide/Hydrochlorothiazide morning of the test. . FEMALES- please wear underwire-free bra if available        After the Test: . Drink plenty of water. . After receiving IV contrast, you may experience a mild flushed feeling. This is normal. . On occasion, you may experience a mild rash up to 24 hours after the test. This is not dangerous. If this occurs, you can take Benadryl 25 mg and increase your fluid intake. . If you experience trouble breathing, this can be serious. If it is severe call 911 IMMEDIATELY. If it is mild, please call our office. . If you take any of these medications: Glipizide/Metformin, Avandament, Glucavance, please do not  take 48 hours after completing test unless otherwise instructed.   Once we have confirmed authorization from your insurance company, we will call you to set up a date and time for your test. Based on how quickly your insurance processes prior authorizations requests, please allow up to 4 weeks to be contacted for scheduling your Cardiac CT appointment. Be advised that routine Cardiac CT appointments could be scheduled as many as 8 weeks after your provider has ordered it.  For non-scheduling related questions, please contact the cardiac imaging nurse navigator should you have any questions/concerns: Marchia Bond, Cardiac Imaging Nurse Navigator Gordy Clement, Cardiac Imaging Nurse Navigator [facility] Direct Office Dial: [phone]   For scheduling needs, including cancellations and rescheduling, please call [place], [phone].    Follow-Up: At [facility], you and your health needs are our priority.  As part of our continuing mission to provide you with exceptional heart care, we have created designated Provider Care Teams.  These Care Teams include your primary Cardiologist (physician) and Advanced Practice Providers (APPs -  Physician Assistants and Nurse Practitioners) who all work together to provide you with the care you need, when you need it.  We recommend signing up for the patient portal called "MyChart".  Sign up information is provided on this After Visit Summary.  MyChart is used to connect with patients for Virtual Visits (Telemedicine).  Patients are able to view lab/test results, encounter notes, upcoming appointments, etc.  Non-urgent messages can be sent to your provider as well.   To  learn more about what you can do with MyChart, go to [email or web address].    Your next appointment:   6-8 week(s)  The format for your next appointment:   In Person  Provider:   You may see Ida Rogue, MD or one of the following Advanced  Practice Providers on your designated Care Team:    Murray Hodgkins, NP  Christell Faith, PA-C  Marrianne Mood, PA-C  Cadence Camden, [place]  Laurann [place], NP

## 2021-02-19 ENCOUNTER — Telehealth: Payer: Self-pay | Admitting: *Deleted

## 2021-02-19 DIAGNOSIS — E876 Hypokalemia: Secondary | ICD-10-CM

## 2021-02-19 LAB — BASIC METABOLIC PANEL
BUN/Creatinine Ratio: 16 (ref 9–23)
BUN: 9 mg/dL (ref 6–24)
CO2: 24 mmol/L (ref 20–29)
Calcium: 9 mg/dL (ref 8.7–10.2)
Chloride: 104 mmol/L (ref 96–106)
Creatinine, Ser: 0.55 mg/dL — ABNORMAL LOW (ref 0.57–1.00)
Glucose: 113 mg/dL — ABNORMAL HIGH (ref 65–99)
Potassium: 3.4 mmol/L — ABNORMAL LOW (ref 3.5–5.2)
Sodium: 143 mmol/L (ref 134–144)
eGFR: 109 mL/min/{1.73_m2} (ref >59)

## 2021-02-19 NOTE — Telephone Encounter (Signed)
-----   Message from Loel Dubonnet, NP sent at 02/19/2021  8:15 AM EDT ----- Normal kidney function. Potassium mildly low. Recommend increasing Potassium 88mEq to twice daily for 3 days then return to 45mEq daily. Recommend repeat BMP at [facility] in 1 week for monitoring.   Please provide potassium refill, if needed. Per patient in clinic, okay to talk with her husband as he assists with medications.

## 2021-02-19 NOTE — Telephone Encounter (Signed)
Spoke to pt's husband, [name] [name], notified of lab results and provider's recc.  [name] [name] verbalized understanding. Will notify pt, she will: Incr Potassium to 39mEq TWICE daily x 3 days,  Then returm to Potassium 51mEq DAILY. [facility].  [name] [name] states that pt does not need any refill at this time.  Lab orders placed.  [name] [name] has no further questions at this time.

## 2021-02-19 NOTE — Therapy (Signed)
[place] MAIN [facility] SERVICES [address] [place], [place], [address] Phone: [phone]   Fax:  [phone]  Speech Language Pathology Treatment  Patient Details  Name: [name] [name] MRN: [id number] Date of Birth: Jul 08, 1966 Referring Provider (SLP): Mar[place]e [place], [place]   Encounter Date: 02/18/2021   End of Session - 02/19/21 5643    Visit Number 2    Number of Visits 25    Date for SLP Re-Evaluation 05/06/21    Authorization Type Medicaid    Authorization Time Period 02/11/2021 thru 05/06/2021    Authorization - Visit Number 2    Progress Note Due on Visit 10    SLP Start Time 1100    SLP Stop Time  1200    SLP Time Calculation (min) 60 min    Activity Tolerance Patient tolerated treatment well           Past Medical History:  Diagnosis Date  . Hypertension     Past Surgical History:  Procedure Laterality Date  . PEG PLACEMENT N/A 12/13/2020   Procedure: PERCUTANEOUS ENDOSCOPIC GASTROSTOMY (PEG) PLACEMENT;  Surgeon: Lesly Rubenstein, MD;  Location: [facility] ENDOSCOPY;  Service: Endoscopy;  Laterality: N/A;  . TRACHEOSTOMY TUBE PLACEMENT N/A 12/13/2020   Procedure: TRACHEOSTOMY;  Surgeon: Clyde Canterbury, MD;  Location: [facility] ORS;  Service: ENT;  Laterality: N/A;    There were no vitals filed for this visit.   Subjective Assessment - 02/19/21 0914    Subjective pt pleasant, eager, "I am doing good"    Patient is accompained by: Family member    Currently in Pain? No/denies          Neuro   ST   TX   NOTE          Treatment Data and Patient's Response to Treatment   Skilled treatment session focused on pt's cognition goals; orientation, basic familiar problem solving and recall. Pt was oriented to month, date but not year, location or day of week. With practice, pt was able to provide immediate recall of information. After a 5 minute delay, pt remained accurate as well as after 30 minute delay. She was able to accurately  recall several activities from prior day but she continues to have difficulty with recall of her children's names. As such, request pt/husband to bring in pictures of loved ones for use in creating a memory book. SLP further faciltiated session by providing counting tasks with coins. Pt was able to independently sort coins into their respective groups. With maximal support from SLP, pt was able to accurately count number of pennies, dimes, nickels and quarters. When support faded, pt was able to use strategies briefly but counting continued to be inaccurate. When creating values such as $ .33, pt was accurate in 4 of 7 opportunities with moderate assistance. SLP engaged pt in basic card game of WAR. Pt required moderate to minimal cues for dealing cards into 2 piles as well as problem solving card values. Cues throughout session were required to increase pt's vocal intensity and speech intelligibility.   Pt continues to be very eager and has great support with her husband. She continues to present with moderate to severe cognitive impairments in memory, basic problem solving, awareness, speech intelligibility and orientation. As such, skilled ST intervention is required to targets these deficits to increase pt's functional independence and reduce caregiver burden.          SLP Education - 02/19/21 [phone]    Education Details  creating a memory book, making an appt with eye doctor to help with pt's ability to read    Person(s) Educated Patient;Spouse    Methods Explanation;Demonstration;Verbal cues    Comprehension Verbalized understanding;Need further instruction;Verbal cues required;Tactile cues required            SLP Short Term Goals - 02/11/21 1156      SLP SHORT TERM GOAL #1   Title With moderate cues, pt will demonstrate selective attention in mildly distracting environment for 15 minutes.    Baseline sustained attention    Time 10    Period --   sessions   Status New      SLP SHORT  TERM GOAL #2   Title With minimal cues, pt will utilize speech intelligibility strategies to achieve > 90% intelligibility at the phrase level.    Baseline moderate assistance to achieve ~ 50% intelligibility at phrase level    Time 10    Period --   sessions   Status New      SLP SHORT TERM GOAL #3   Title With minimal cues, pt will utilize external memory aids for recall of family members' names and ages with 95% accuracy.    Baseline 50% accurate    Time 10    Period --   sessions   Status New      SLP SHORT TERM GOAL #4   Title With minimal assistance, pt will demonstrate functional problem solving for basic and familiar tasks to achieve >95% accuracy.    Baseline moderate cues    Time 10    Period --   sessions   Status New      SLP SHORT TERM GOAL #5   Title Pt will consume current diet without any reports s/s of dysphagia by pt or family members.    Baseline upgraded to dysphagia 3 diet on 02/11/2021    Time 10    Period --   session   Status New            SLP Long Term Goals - 02/11/21 1202      SLP LONG TERM GOAL #1   Title Pt will consume safest, least restrictive diet without overt s/s of dysphagia, aspiration or respiratory distress.    Baseline upgraded to dysphagia 3 diet on 02/11/2021    Time 12    Period Weeks    Status New    Target Date 05/06/21      SLP LONG TERM GOAL #2   Title Pt will independently complete functional problem solving for basic and familiar tasks.    Baseline moderate assistance    Time 12    Period Weeks    Status New    Target Date 05/06/21      SLP LONG TERM GOAL #3   Title Pt will demonstrate selective attention in a moderately distracting environment for 15 minutes with < 1 redirection to task.    Baseline sustained attention    Time 12    Period Weeks    Status New    Target Date 05/06/21      SLP LONG TERM GOAL #4   Title With minimal cues, pt will increase speech intelligibility to > 75% at the sentence level.     Baseline 50% at the phrase level    Time 12    Period Weeks    Status New    Target Date 05/06/21  Plan - 02/19/21 0918        Speech Therapy Frequency 2x / week    Duration 12 weeks    Treatment/Interventions Diet toleration management by SLP;Cognitive reorganization;Compensatory techniques;Internal/external aids;SLP instruction and feedback;Patient/family education;Functional tasks;Compensatory strategies    Potential to Achieve Goals Good    Potential Considerations Severity of impairments    SLP Home Exercise Plan provided    Consulted and Agree with Plan of Care Patient;Family member/caregiver    Family Member Consulted pt's husband           Patient will benefit from skilled therapeutic intervention in order to improve the following deficits and impairments:   Cognitive communication deficit  Anoxic brain injury [facility])    Problem List Patient Active Problem List   Diagnosis Date Noted  . Dysarthria and anarthria 02/11/2021  . Sinus tachycardia   . Labile blood glucose   . Dysphagia   . Seizure prophylaxis   . Malnutrition of moderate degree 01/23/2021  . Anoxic brain injury ([place]) 01/10/2021  . Prediabetes   . Dyslipidemia   . S/P percutaneous endoscopic gastrostomy (PEG) tube placement ([place])   . Status post tracheostomy ([place])   . Aspiration pneumonia of both lower lobes due to gastric secretions ([place]) 01/05/2021  . Acute respiratory failure ([place])   . Hypoxic ischemic encephalopathy   . Endotracheal tube present   . Cardiac arrest ([place]) 12/02/2020  . Essential hypertension 12/09/2011   Manami Tutor B. Rutherford Nail M.S., CCC-SLP, [place] Pathologist Rehabilitation Services Office [phone]  Stormy Fabian 02/19/2021, 9:19 AM  [place] MAIN [facility] SERVICES [address] [place], [place], [address] Phone: [phone]   Fax:  [phone]   Name: [name] [name] MRN: [id number] Date of Birth:  31-Dec-1965

## 2021-02-19 NOTE — Patient Instructions (Signed)
Practice dealing cards, counting coins, bring in pictures of loved ones

## 2021-02-20 ENCOUNTER — Ambulatory Visit: Payer: Self-pay | Admitting: Speech Pathology

## 2021-02-20 ENCOUNTER — Encounter: Payer: Self-pay | Admitting: Occupational Therapy

## 2021-02-20 ENCOUNTER — Ambulatory Visit: Payer: Self-pay | Admitting: Occupational Therapy

## 2021-02-20 ENCOUNTER — Other Ambulatory Visit: Payer: Self-pay

## 2021-02-20 DIAGNOSIS — Z8674 Personal history of sudden cardiac arrest: Secondary | ICD-10-CM | POA: Insufficient documentation

## 2021-02-20 DIAGNOSIS — G931 Anoxic brain damage, not elsewhere classified: Secondary | ICD-10-CM

## 2021-02-20 DIAGNOSIS — R41841 Cognitive communication deficit: Secondary | ICD-10-CM

## 2021-02-20 DIAGNOSIS — R278 Other lack of coordination: Secondary | ICD-10-CM

## 2021-02-20 NOTE — Therapy (Signed)
[place] MAIN [facility] SERVICES [address] [place], [place], [address] Phone: [phone]   Fax:  [phone]  Occupational Therapy Treatment  Patient Details  Name: [name] [name] MRN: [id number] Date of Birth: 1966-10-19 No data recorded  Encounter Date: 02/20/2021   OT End of Session - 02/20/21 1311    Visit Number 4    Number of Visits 24    Date for OT Re-Evaluation 05/13/21    Authorization Type Progress reports period starting 02/11/2021    OT Start Time 1301    OT Stop Time 1346    OT Time Calculation (min) 45 min    Activity Tolerance Patient tolerated treatment well    Behavior During Therapy [facility] for tasks assessed/performed           Past Medical History:  Diagnosis Date  . Hypertension     Past Surgical History:  Procedure Laterality Date  . PEG PLACEMENT N/A 12/13/2020   Procedure: PERCUTANEOUS ENDOSCOPIC GASTROSTOMY (PEG) PLACEMENT;  Surgeon: Lesly Rubenstein, MD;  Location: [facility] ENDOSCOPY;  Service: Endoscopy;  Laterality: N/A;  . TRACHEOSTOMY TUBE PLACEMENT N/A 12/13/2020   Procedure: TRACHEOSTOMY;  Surgeon: Clyde Canterbury, MD;  Location: [facility] ORS;  Service: ENT;  Laterality: N/A;    There were no vitals filed for this visit.   Subjective Assessment - 02/20/21 1257    Subjective  Pt reports she is excited about the progress hse has been making.    Patient is accompanied by: Family member    Pertinent History Pt. is a 55 y.o. female who went into cardiac arrest, and was admitted to [facility] on 12/02/2020. Pt. was diagnosed with Anoxic Brain Injury following the Cardiac Arrest. When medically stable, the pt. was tranferred to [facility] re[facility]. The patient was discharged home on 02/08/2021, and is ready to begin outpatient OT services. Pt. was independent, and working prior to the onset. Pt. Has very supportive family. Pt. Enjoys gardening.    Currently in Pain? No/denies           Therapeutic Activity Pt  completed removing and replacing Perfection game peices (25 pieces) with MOD cues for basic shapes (circle, square) and MAX cues to match more complex shapes (pentagon, x, S, rainbow). Pt required grading of task - handed 1 piece at a time, initially cued which square then progressing to cues for which row piece fits in. Pt demonstrated difficulty orienting piece - requires consistent cues to hold the stick, pt repeatedly attempts to place pieces in upside down/sideways and shows fair carryover of verbal cues, improved with visual demonstrations. Pt required 26 minutes to complete removing and returning each piece to board. Pt completed building 1 tower with PVC pipe - requires visual demonstration to line pieces up together. Pt requires MAX cues to identify errors, pt repeatedly attempts to fit pieces together incorrectly.          OT Education - 02/20/21 1311    Education Details HEP    Person(s) Educated Patient;Spouse    Methods Explanation;Demonstration    Comprehension Verbalized understanding;Returned demonstration               OT Long Term Goals - 02/11/21 1459      OT LONG TERM GOAL #1   Title Pt. will be able to independently sign her name with 100% legibility.    Baseline Eval: Signature not legible, however was able to sign on the line with no deviation above, or below the line.  Time 12    Period Weeks    Status New    Target Date 05/13/21      OT LONG TERM GOAL #2   Title Pt. will increase right shoulder AROM to be able to independently retrieve items from closets, and cabinets.    Baseline Eval: Pt. presents with limited ROM.    Time 12    Period Weeks    Status New    Target Date 05/13/21      OT LONG TERM GOAL #3   Title Pt. will increase right grip strength by 5# to be able hold a full glass    Baseline Eval: limited grip strength. Limited bilateral digit flexion, unable to make a full composite fist.    Time 12    Period Weeks    Status New    Target  Date 05/13/21      OT LONG TERM GOAL #4   Title Pt. will independently access a computer, and type a one sentence email correspondence.    Baseline Eval: Pt. is unable to to perform    Time 12    Period Weeks    Status New    Target Date 05/13/21      OT LONG TERM GOAL #5   Title Pt. will improve bilateral [facility] skills to be able to grasp, and manipulate small objects during ADLs.    Baseline Eval: Pt. presents [facility] skills with difficulty picking,a nd manipulating small objects.    Time 12    Period Weeks    Status New    Target Date 05/13/21      OT LONG TERM GOAL #6   Title Pt. will improve UE strength by 2 mm grades to assist with ADLs, and IADLS,    Baseline Eval: pt. has difficulty performing.    Time 12    Period Weeks    Status New    Target Date 05/13/21      OT LONG TERM GOAL #7   Title Pt. will complete light meal preparation with minA    Baseline Eval: Pt. is unable to prepare light meal preparation.    Time 12    Period Weeks    Status New    Target Date 05/13/21      OT LONG TERM GOAL #8   Title Pt. will perform light home management tasks with minA    Baseline Eval: pt. is unable to perform    Time 12    Period Weeks    Status New    Target Date 05/13/21                 Plan - 02/20/21 1312    Clinical Impression Statement Pt completed removing and replacing Perfection game peices (25 pieces) with MOD cues for basic shapes (circle, square) and MAX cues to match more complex shapes (pentagon, x, S, rainbow). Pt required grading of task - handed 1 piece at a time, initially cued which square then progressing to cues for which row. 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 Pt continues to work on improving RUE ROM, strength, and [place] skills in order to work towards improving RUE engagement during daily tasks,  and maximizing independence with ADLs, and IADLs.    OT Occupational Profile and History Problem Focused Assessment - Including review of records relating to presenting problem  Occupational performance deficits (Please refer to evaluation for details): ADL's;IADL's    Body Structure / Function / Physical Skills ADL;IADL;FMC;ROM;Strength    Rehab Potential Good    Clinical Decision Making Several treatment options, min-mod task modification necessary    Comorbidities Affecting Occupational Performance: May have comorbidities impacting occupational performance    Modification or Assistance to Complete Evaluation  Min-Moderate modification of tasks or assist with assess necessary to complete eval    OT Frequency 2x / week    OT Duration 12 weeks    OT Treatment/Interventions Self-care/ADL training;Neuromuscular education;Therapeutic activities;Patient/family education;DME and/or AE instruction;Visual/perceptual remediation/compensation    Consulted and Agree with Plan of Care Patient           Patient will benefit from skilled therapeutic intervention in order to improve the following deficits and impairments:   Body Structure / Function / Physical Skills: ADL,IADL,FMC,ROM,Strength       Visit Diagnosis: Anoxic brain injury ([place])  Other lack of coordination    Problem List Patient Active Problem List   Diagnosis Date Noted  . Dysarthria and anarthria 02/11/2021  . Sinus tachycardia   . Labile blood glucose   . Dysphagia   . Seizure prophylaxis   . Malnutrition of moderate degree 01/23/2021  . Anoxic brain injury ([place]) 01/10/2021  . Prediabetes   . Dyslipidemia   . S/P percutaneous endoscopic gastrostomy (PEG) tube placement ([place])   . Status post tracheostomy ([place])   . Aspiration pneumonia of both lower lobes due to gastric secretions ([place]) 01/05/2021  . Acute respiratory failure ([place])   . Hypoxic ischemic encephalopathy   . Endotracheal tube present   . Cardiac arrest  ([place]) 12/02/2020  . Essential hypertension 12/09/2011    Dessie Coma, M.S. OTR/L  02/20/21, 1:59 PM  ascom [phone]  [place] MAIN [facility] SERVICES [address] [place], [place], [address] Phone: [phone]   Fax:  [phone]  Name: ALAIZA YAU MRN: [id number] Date of Birth: 1966/10/26

## 2021-02-20 NOTE — Therapy (Signed)
[place] MAIN [facility] SERVICES [address] [place], [place], [address] Phone: [phone]   Fax:  [phone]  Speech Language Pathology Treatment  Patient Details  Name: [name] [name] MRN: [id number] Date of Birth: [birth year] Referring Provider (SLP): [place]e [place], [place]   Encounter Date: 02/20/2021   End of Session - 02/20/21 1337    Visit Number 3    Number of Visits 25    Date for SLP Re-Evaluation 05/06/21    Authorization Type Medicaid    Authorization Time Period 02/11/2021 thru 05/06/2021    Authorization - Visit Number 3    Progress Note Due on Visit 10    SLP Start Time 1200    SLP Stop Time  1300    SLP Time Calculation (min) 60 min    Activity Tolerance Patient tolerated treatment well           Past Medical History:  Diagnosis Date  . Hypertension     Past Surgical History:  Procedure Laterality Date  . PEG PLACEMENT N/A 12/13/2020   Procedure: PERCUTANEOUS ENDOSCOPIC GASTROSTOMY (PEG) PLACEMENT;  Surgeon: Lesly Rubenstein, MD;  Location: [facility] ENDOSCOPY;  Service: Endoscopy;  Laterality: N/A;  . TRACHEOSTOMY TUBE PLACEMENT N/A 12/13/2020   Procedure: TRACHEOSTOMY;  Surgeon: Clyde Canterbury, MD;  Location: [facility] ORS;  Service: ENT;  Laterality: N/A;    There were no vitals filed for this visit.   Subjective Assessment - 02/20/21 1335    Subjective pt pleasant, eager, "I am doing good"    Patient is accompained by: Family member    Currently in Pain? No/denies            Neuro   ST   TX   NOTE          Treatment Data and Patient's Response to Treatment   Skilled treatment session focused on pt's cognitive communication deficits. SLP facilitiated session by providing pt with calendar to aid in answering orientation questions. Today pt was perseverative on responding with "23rd" throughout orientation portion. With maximal multi-modal cues pt was able to locate information on calendar - What month is it?, What day  is it?, What is the date?, What is the year? And Where do you go for therapy? Once information was located on calendar, pt was not able to write the information legibly. SLP created a printed sheet with 3 choices for month, all days of the week listed, and 2 choices for location. With maximal multi-modal assistance with worksheet, pt was able to circle appropriate choices. She was not able to demonstrate any recall beyond completion of worksheet. SLP further facilitated session by providing maximal cues for recall of names of basic food items.   Pt continues to eagerly participate in skilled ST sessions and does have insight into difficulty with tasks. She is, however, unable to complete basic familiar tasks with extensive assistance from therapist or caregiver. As such, skilled ST intervention continues to be warranted to increase pt's functional independence and reduce caregiver burden.          SLP Education - 02/20/21 1336    Education Details memory loss d/t anoxic brain injury and perception deficits related to speech intelligibility    Person(s) Educated Patient;Spouse    Methods Explanation;Demonstration;Verbal cues;Handout    Comprehension Verbalized understanding;Need further instruction;Verbal cues required;Tactile cues required            SLP Short Term Goals - 02/11/21 1156      SLP SHORT TERM  GOAL #1   Title With moderate cues, pt will demonstrate selective attention in mildly distracting environment for 15 minutes.    Baseline sustained attention    Time 10    Period --   sessions   Status New      SLP SHORT TERM GOAL #2   Title With minimal cues, pt will utilize speech intelligibility strategies to achieve > 90% intelligibility at the phrase level.    Baseline moderate assistance to achieve ~ 50% intelligibility at phrase level    Time 10    Period --   sessions   Status New      SLP SHORT TERM GOAL #3   Title With minimal cues, pt will utilize external memory  aids for recall of family members' names and ages with 95% accuracy.    Baseline 50% accurate    Time 10    Period --   sessions   Status New      SLP SHORT TERM GOAL #4   Title With minimal assistance, pt will demonstrate functional problem solving for basic and familiar tasks to achieve >95% accuracy.    Baseline moderate cues    Time 10    Period --   sessions   Status New      SLP SHORT TERM GOAL #5   Title Pt will consume current diet without any reports s/s of dysphagia by pt or family members.    Baseline upgraded to dysphagia 3 diet on 02/11/2021    Time 10    Period --   session   Status New            SLP Long Term Goals - 02/11/21 1202      SLP LONG TERM GOAL #1   Title Pt will consume safest, least restrictive diet without overt s/s of dysphagia, aspiration or respiratory distress.    Baseline upgraded to dysphagia 3 diet on 02/11/2021    Time 12    Period Weeks    Status New    Target Date 05/06/21      SLP LONG TERM GOAL #2   Title Pt will independently complete functional problem solving for basic and familiar tasks.    Baseline moderate assistance    Time 12    Period Weeks    Status New    Target Date 05/06/21      SLP LONG TERM GOAL #3   Title Pt will demonstrate selective attention in a moderately distracting environment for 15 minutes with < 1 redirection to task.    Baseline sustained attention    Time 12    Period Weeks    Status New    Target Date 05/06/21      SLP LONG TERM GOAL #4   Title With minimal cues, pt will increase speech intelligibility to > 75% at the sentence level.    Baseline 50% at the phrase level    Time 12    Period Weeks    Status New    Target Date 05/06/21            Plan - 02/20/21 1337    Speech Therapy Frequency 2x / week    Duration 12 weeks    Treatment/Interventions Diet toleration management by SLP;Cognitive reorganization;Compensatory techniques;Internal/external aids;SLP instruction and  feedback;Patient/family education;Functional tasks;Compensatory strategies    Potential to Achieve Goals Good    Potential Considerations Severity of impairments    SLP Home Exercise Plan provided, see instruction section  Consulted and Agree with Plan of Care Patient;Family member/caregiver    Family Member Consulted pt's husband           Patient will benefit from skilled therapeutic intervention in order to improve the following deficits and impairments:   Cognitive communication deficit  Anoxic brain injury [facility])    Problem List Patient Active Problem List   Diagnosis Date Noted  . Dysarthria and anarthria 02/11/2021  . Sinus tachycardia   . Labile blood glucose   . Dysphagia   . Seizure prophylaxis   . Malnutrition of moderate degree 01/23/2021  . Anoxic brain injury ([place]) 01/10/2021  . Prediabetes   . Dyslipidemia   . S/P percutaneous endoscopic gastrostomy (PEG) tube placement ([place])   . Status post tracheostomy ([place])   . Aspiration pneumonia of both lower lobes due to gastric secretions ([place]) 01/05/2021  . Acute respiratory failure ([place])   . Hypoxic ischemic encephalopathy   . Endotracheal tube present   . Cardiac arrest ([place]) 12/02/2020  . Essential hypertension 12/09/2011   Thorvald Orsino B. Rutherford Nail M.S., CCC-SLP, [place] Pathologist Rehabilitation Services Office [phone]  Stormy Fabian 02/20/2021, 1:38 PM  [place] MAIN [facility] SERVICES [address] [place], [place], [address] Phone: [phone]   Fax:  [phone]   Name: JOURNIEE FELDKAMP MRN: [id number] Date of Birth: 08/15/1966

## 2021-02-20 NOTE — Patient Instructions (Signed)
Answer orientation questions while looking at calendar provided in this session.

## 2021-02-21 ENCOUNTER — Encounter: Payer: Self-pay | Admitting: Registered Nurse

## 2021-02-21 ENCOUNTER — Other Ambulatory Visit: Payer: Self-pay

## 2021-02-21 ENCOUNTER — Encounter: Payer: Self-pay | Attending: Registered Nurse | Admitting: Registered Nurse

## 2021-02-21 VITALS — BP 144/97 | HR 67 | Temp 97.8°F | Ht 63.0 in | Wt 170.0 lb

## 2021-02-21 DIAGNOSIS — I469 Cardiac arrest, cause unspecified: Secondary | ICD-10-CM | POA: Insufficient documentation

## 2021-02-21 DIAGNOSIS — G931 Anoxic brain damage, not elsewhere classified: Secondary | ICD-10-CM | POA: Insufficient documentation

## 2021-02-21 DIAGNOSIS — I10 Essential (primary) hypertension: Secondary | ICD-10-CM | POA: Insufficient documentation

## 2021-02-21 DIAGNOSIS — Z298 Encounter for other specified prophylactic measures: Secondary | ICD-10-CM | POA: Insufficient documentation

## 2021-02-21 NOTE — Progress Notes (Signed)
Subjective:    Patient ID: [name] [name], female    DOB: 02/18/1966, 55 y.o.   MRN: [id number]  HPI: [name] [name] is a 55 y.o. female who is here for Hospital follow up on her Cardiac Arrest, Anoxic Brain Injury, Essential Hypertension and Seizure Prophylaxis. [name] [name] presented to [facility] on 12/02/2020 with witnessed cardiac arrest V Fib and shocked in the field, brought to hospital via EMS. . [name] [name] had long hospital stay see discharge summaries for details. Neurology, Cardiology, Gastroenterology and Otolaryngology.   CT Head WO Contrast:  IMPRESSION: No acute finding.  No visible anoxic injury.  MR Brain WO Contrast:  IMPRESSION: Normal MRI head  [name] [name] was maintained on Keppra for seizure prophylaxis.   [name] [name] was admitted to inpatient rehabilitation on 01/10/2021 and discharged home on 02/08/2021. She will be going to outpatient therapy at [facility]. She denies any pain. She rates her pain 0. Also reports she has a good appetite.   Husband in room.    Pain Inventory Average Pain 0 Pain Right Now 0 My pain is no pain  LOCATION OF PAIN    BOWEL Number of stools per week: 14 Oral laxative use No  Type of laxative  Enema or suppository use No  History of colostomy No  Incontinent No   BLADDER Normal In and out cath, frequency  Able to self cath NA Bladder incontinence No  Frequent urination No  Leakage with coughing No  Difficulty starting stream No  Incomplete bladder emptying No    Mobility walk without assistance how many minutes can you walk? 45 MINS ability to climb steps?  yes do you drive?  no  Function not employed: date last employed JANUARY 7TH 2022 I need assistance with the following:  feeding, dressing, bathing, toileting, meal prep, household duties and shopping  Neuro/Psych  n/a  Prior Studies Any changes since last visit?  no  Physicians involved in your care    Family History  Problem Relation Age of  Onset  . Hypertension Mother   . Leukemia Mother   . Diabetes Mother   . Healthy Father    Social History   Socioeconomic History  . Marital status: Married    Spouse name: Not on file  . Number of children: Not on file  . Years of education: Not on file  . Highest education level: Not on file  Occupational History  . Not on file  Tobacco Use  . Smoking status: Former Smoker    Quit date: 2007    Years since quitting: 15.2  . Smokeless tobacco: Never Used  Vaping Use  . Vaping Use: Never used  Substance and Sexual Activity  . Alcohol use: No  . Drug use: Never  . Sexual activity: Not on file  Other Topics Concern  . Not on file  Social History Narrative  . Not on file   Social Determinants of Health   Financial Resource Strain: Not on file  Food Insecurity: Not on file  Transportation Needs: Not on file  Physical Activity: Not on file  Stress: Not on file  Social Connections: Not on file   Past Surgical History:  Procedure Laterality Date  . PEG PLACEMENT N/A 12/13/2020   Procedure: PERCUTANEOUS ENDOSCOPIC GASTROSTOMY (PEG) PLACEMENT;  Surgeon: Lesly Rubenstein, MD;  Location: [facility] ENDOSCOPY;  Service: Endoscopy;  Laterality: N/A;  . TRACHEOSTOMY TUBE PLACEMENT N/A 12/13/2020   Procedure: TRACHEOSTOMY;  Surgeon: Clyde Canterbury, MD;  Location: [facility]  ORS;  Service: ENT;  Laterality: N/A;   Past Medical History:  Diagnosis Date  . Hypertension    BP (!) 144/97   Pulse 67   Temp 97.8 F (36.6 C)   Ht 5\' 3"  (1.6 m)   Wt 170 lb (77.1 kg)   SpO2 95%   BMI 30.11 kg/m   Opioid Risk Score:   Fall Risk Score:  `1  Depression screen PHQ 2/9  Depression screen PHQ 2/9 02/21/2021  Decreased Interest 0  Down, Depressed, Hopeless 0  PHQ - 2 Score 0  Altered sleeping 0  Tired, decreased energy 0  Change in appetite 0  Feeling bad or failure about yourself  0  Trouble concentrating 0  Moving slowly or fidgety/restless 0  Suicidal thoughts 0  PHQ-9 Score 0       Review of Systems  Constitutional: Negative.   HENT: Negative.   Eyes: Negative.   Respiratory: Negative.   Cardiovascular: Negative.   Gastrointestinal: Negative.   Endocrine: Negative.   Genitourinary: Negative.   Musculoskeletal: Negative.   Skin: Negative.   Allergic/Immunologic: Negative.   Hematological: Negative.   Psychiatric/Behavioral: Negative.   All other systems reviewed and are negative.      Objective:   Physical Exam Vitals and nursing note reviewed.  Constitutional:      Appearance: Normal appearance.  Cardiovascular:     Rate and Rhythm: Normal rate and regular rhythm.     Pulses: Normal pulses.     Heart sounds: Normal heart sounds.  Pulmonary:     Effort: Pulmonary effort is normal.     Breath sounds: Normal breath sounds.  Musculoskeletal:     Cervical back: Normal range of motion and neck supple.     Comments: Normal Muscle Bulk and Muscle Testing Reveals:  Upper Extremities: Full ROM and Muscle Strength 3/5 Lower Extremities: Full ROM and Muscle Strength 5/5 Arises from Table slowly Narrow Based  Gait   Skin:    General: Skin is warm and dry.  Neurological:     Mental Status: She is alert and oriented to person, place, and time.  Psychiatric:        Mood and Affect: Mood normal.        Behavior: Behavior normal.           Assessment & Plan:  1. Cardiac Arrest/Anoxic Brain Injury: Continue Outpatient Therapy at [facility]. She has a scheduled appointment with Cardiology. Continue to monitor.  2. Essential Hypertension: Continue current medication regimen. PCP Following.  3.Seizure Prophylaxis: Continue current medication regimen. PCP Following.   F/U with Dr Naaman Plummer in 4- 6 weeks

## 2021-02-25 ENCOUNTER — Ambulatory Visit: Payer: Self-pay

## 2021-02-25 ENCOUNTER — Ambulatory Visit: Payer: Self-pay | Attending: Physician Assistant | Admitting: Speech Pathology

## 2021-02-25 ENCOUNTER — Other Ambulatory Visit: Payer: Self-pay

## 2021-02-25 DIAGNOSIS — R2681 Unsteadiness on feet: Secondary | ICD-10-CM | POA: Insufficient documentation

## 2021-02-25 DIAGNOSIS — M6281 Muscle weakness (generalized): Secondary | ICD-10-CM | POA: Insufficient documentation

## 2021-02-25 DIAGNOSIS — G931 Anoxic brain damage, not elsewhere classified: Secondary | ICD-10-CM

## 2021-02-25 DIAGNOSIS — R269 Unspecified abnormalities of gait and mobility: Secondary | ICD-10-CM | POA: Insufficient documentation

## 2021-02-25 DIAGNOSIS — R41841 Cognitive communication deficit: Secondary | ICD-10-CM | POA: Insufficient documentation

## 2021-02-25 DIAGNOSIS — R2689 Other abnormalities of gait and mobility: Secondary | ICD-10-CM | POA: Insufficient documentation

## 2021-02-25 DIAGNOSIS — R278 Other lack of coordination: Secondary | ICD-10-CM

## 2021-02-25 DIAGNOSIS — R262 Difficulty in walking, not elsewhere classified: Secondary | ICD-10-CM | POA: Insufficient documentation

## 2021-02-25 NOTE — Therapy (Signed)
[place] MAIN [facility] SERVICES [address] [place], [place], [address] Phone: [phone]   Fax:  [phone]  Physical Therapy Treatment  Patient Details  Name: [name] [name] MRN: [id number] Date of Birth: [birth year] Referring Provider (PT): D. Sumner- PA-C   Encounter Date: 02/25/2021   PT End of Session - 02/26/21 1353    Visit Number 2    Number of Visits 25    Date for PT Re-Evaluation 05/08/21    Authorization Type 03/24/202- 05/08/2021    PT Start Time 1601    PT Stop Time 1645    PT Time Calculation (min) 44 min    Equipment Utilized During Treatment Gait belt    Activity Tolerance Patient tolerated treatment well    Behavior During Therapy WFL for tasks assessed/performed           Past Medical History:  Diagnosis Date  . Hypertension     Past Surgical History:  Procedure Laterality Date  . PEG PLACEMENT N/A 12/13/2020   Procedure: PERCUTANEOUS ENDOSCOPIC GASTROSTOMY (PEG) PLACEMENT;  Surgeon: Lesly Rubenstein, MD;  Location: [facility] ENDOSCOPY;  Service: Endoscopy;  Laterality: N/A;  . TRACHEOSTOMY TUBE PLACEMENT N/A 12/13/2020   Procedure: TRACHEOSTOMY;  Surgeon: Clyde Canterbury, MD;  Location: [facility] ORS;  Service: ENT;  Laterality: N/A;    There were no vitals filed for this visit.   Subjective Assessment - 02/25/21 1605    Subjective Patient denies any new complaints and reports ready to work hard today.    Patient is accompained by: Family member   Husband   Pertinent History Patient experienced a Cardiac arrest on 12/02/2020 with subsequent anoxic brain injury requiring intubation.    Limitations Lifting;Standing;Walking;Writing;House hold activities    How long can you sit comfortably? No issues    How long can you stand comfortably? Limited due to fatigue.    How long can you walk comfortably? Limited secondary to fatigue.    Patient Stated Goals Improve my balance and mobility back to independent    Currently in  Pain? No/denies             BP= 123/54 mmHg Right UE HR= 72 bpm  Instructed in standing LE strengthening at //bars:   Standing march Standing hip abd Standing hip ext Standing knee flex Standing minisquats Standing calf raises 10 reps each with increased VC and tactile cues for correct technique. Education provided throughout session via VC/TC and demonstration to facilitate movement at target joints and correct muscle activation for all testing and exercises performed.    Neuromuscular re-ed: In //bars  Dynamic tapping on hedgehog- Patient able to tap on colored cones on command without significant difficulty  Forward stepping over hedgehog-  Side stepping over hedgehog (4) in // bars Side stepping over hurdle. Max difficulty coordinating steps today requiring close CGA  Clinical Impression: Difficulty coordinating movement today with unsteadiness and impaired cognition. Patient performed well with initial LE strengthening exercises and added to home program successfully today. Pt will benefit from  Continued skilled PT services to address deficits in strength and balance to decrease risk for future falls and improve overall quality of life.                           PT Education - 02/26/21 1349    Education Details specific exercise techniques and issued HEP for standing LE strengthening today.    Person(s) Educated Patient    Methods  Explanation;Handout;Demonstration;Tactile cues;Verbal cues    Comprehension Verbalized understanding;Returned demonstration;Verbal cues required;Tactile cues required;Need further instruction            PT Short Term Goals - 02/13/21 2239      PT SHORT TERM GOAL #1   Title Pt will be independent with HEP in order to improve strength and balance in order to decrease fall risk and improve function at home and work.    Baseline 02/13/2021- Patient has no formal HEP in place.    Time 6    Period Weeks    Status New     Target Date 03/27/21             PT Long Term Goals - 02/13/21 2237      PT LONG TERM GOAL #1   Title Pt will improve FOTO to target score to 77 to display perceived improvements in ability to complete ADL's.    Baseline 02/13/2021= FOTO score-69    Time 12    Period Weeks    Status New    Target Date 05/08/21      PT LONG TERM GOAL #2   Title Pt will decrease 5TSTS by at least 3 seconds in order to demonstrate clinically significant improvement in LE strength.    Baseline 02/13/2021= 15.0 sec without UE support    Time 12    Period Weeks    Status New    Target Date 05/08/21      PT LONG TERM GOAL #3   Title Patient will increase RLE gross strength to 4+/5 as to improve functional strength for independent gait, increased standing tolerance and increased ADL ability.    Baseline 02/13/2021- Patient presents with 4/5 right Hip flex/knee flex/ext, ankle DF    Time 12    Period Weeks    Status New    Target Date 05/08/21      PT LONG TERM GOAL #4   Title Patient will be modified independent in walking on even/uneven surface with least restrictive assistive device, for 10+ minutes without rest break, reporting some difficulty or less to improve walking tolerance with community ambulation including grocery shopping, going to [place] 02/13/2021- Patient presents with limited ambulation around 120 feet with decreased coordination and fatigue.    Time 12    Period Weeks    Status New    Target Date 05/08/21      PT LONG TERM GOAL #5   Title Patient will increase Functional Gait Assessment score to >15 /30 as to reduce fall risk and improve dynamic gait safety with community ambulation.    Baseline 02/13/2021: FGA= 8/20    Time 12    Period Weeks    Status New    Target Date 05/08/21                 Plan - 02/25/21 1354    Clinical Impression Statement Difficulty coordinating movement today with unsteadiness and impaired cognition. Patient performed  well with initial LE strengthening exercises and added to home program successfully today. Pt will benefit from  Continued skilled PT services to address deficits in strength and balance to decrease risk for future falls and improve overall quality of life.    Personal Factors and Comorbidities Comorbidity 3+    Comorbidities Anoxic brain injury, Sinus tachycardia, Dysphagia    Examination-Activity Limitations Caring for Others;Carry;Lift;Squat;Stairs    Examination-Participation Restrictions Cleaning;Community Activity;Driving;Laundry;Occupation;Personal Finances;Yard Work    Stability/Clinical Decision Making Stable/Uncomplicated  Rehab Potential Good    PT Frequency 2x / week    PT Duration 12 weeks    PT Treatment/Interventions ADLs/Self Care Home Management;Cryotherapy;Moist Heat;DME Instruction;Gait training;Stair training;Functional mobility training;Therapeutic activities;Therapeutic exercise;Balance training;Neuromuscular re-education;Cognitive remediation;Patient/family education    PT Next Visit Plan Initiate LE strengthening, Coordination/balance activities.    PT Home Exercise Plan Added standing LE strengthening HEP today and issued handout    Consulted and Agree with Plan of Care Patient;Family member/caregiver    Family Member Consulted Patient son           Patient will benefit from skilled therapeutic intervention in order to improve the following deficits and impairments:  Abnormal gait,Decreased activity tolerance,Decreased balance,Decreased cognition,Decreased coordination,Decreased endurance,Decreased mobility,Difficulty walking,Decreased strength  Visit Diagnosis: Anoxic brain injury [facility])  Other lack of coordination  Muscle weakness (generalized)     Problem List Patient Active Problem List   Diagnosis Date Noted  . Dysarthria and anarthria 02/11/2021  . Sinus tachycardia   . Labile blood glucose   . Dysphagia   . Seizure prophylaxis   . Malnutrition  of moderate degree 01/23/2021  . Anoxic brain injury ([place]) 01/10/2021  . Prediabetes   . Dyslipidemia   . S/P percutaneous endoscopic gastrostomy (PEG) tube placement ([place])   . Status post tracheostomy ([place])   . Aspiration pneumonia of both lower lobes due to gastric secretions ([place]) 01/05/2021  . Acute respiratory failure ([place])   . Hypoxic ischemic encephalopathy   . Endotracheal tube present   . Cardiac arrest ([place]) 12/02/2020  . Essential hypertension 12/09/2011    Lewis Moccasin, PT 02/26/2021, 4:38 PM  [place] MAIN [facility] SERVICES [address] [place], [place], [address] Phone: [phone]   Fax:  [phone]  Name: ELTA ANGELL MRN: [id number] Date of Birth: August 07, 1966

## 2021-02-26 ENCOUNTER — Other Ambulatory Visit
Admission: RE | Admit: 2021-02-26 | Discharge: 2021-02-26 | Disposition: A | Discharge status: Home / Self Care | Payer: Self-pay | Attending: Family | Admitting: Family

## 2021-02-26 ENCOUNTER — Other Ambulatory Visit: Payer: Self-pay

## 2021-02-26 ENCOUNTER — Telehealth: Payer: Self-pay | Admitting: *Deleted

## 2021-02-26 ENCOUNTER — Encounter: Payer: Self-pay | Admitting: Family

## 2021-02-26 ENCOUNTER — Telehealth: Payer: Self-pay | Admitting: Adult Health Nurse Practitioner

## 2021-02-26 ENCOUNTER — Telehealth (HOSPITAL_COMMUNITY): Payer: Self-pay | Admitting: *Deleted

## 2021-02-26 DIAGNOSIS — Z8674 Personal history of sudden cardiac arrest: Secondary | ICD-10-CM

## 2021-02-26 DIAGNOSIS — E876 Hypokalemia: Secondary | ICD-10-CM | POA: Insufficient documentation

## 2021-02-26 LAB — BASIC METABOLIC PANEL
Anion gap: 9 (ref 5–15)
BUN: 10 mg/dL (ref 6–20)
CO2: 28 mmol/L (ref 22–32)
Calcium: 8.7 mg/dL — ABNORMAL LOW (ref 8.9–10.3)
Chloride: 103 mmol/L (ref 98–111)
Creatinine, Ser: 0.61 mg/dL (ref 0.44–1.00)
GFR, Estimated: >60 mL/min (ref >60)
Glucose, Bld: 101 mg/dL — ABNORMAL HIGH (ref 70–99)
Potassium: 2.8 mmol/L — ABNORMAL LOW (ref 3.5–5.1)
Sodium: 140 mmol/L (ref 135–145)

## 2021-02-26 NOTE — Telephone Encounter (Signed)
Attempted to call pt, called both numbers listed for pt and emergency contacts.  Pt did not answer and unable to leave voicemail on pt's phone.  Message left on emergency contact phone, Darien Ramus, asking that pt please call us back to review results.

## 2021-02-26 NOTE — Telephone Encounter (Signed)
Spoke to pt's husband, with pt, notified of results and provider's recc.  Verbalized understanding, pt will:  INCR Potassium to 32mEq daily x 2 days then resume 19mEq daily.  Pt has already had 55mEq this AM, she will take an additional 3 tablets now for total of 63mEq.  And will take 4 tablets tomorrow morning with breakfast for second day of 29mEq.  Pt is scheduled for her cardiac CT tomorrow afternoon.  Then pt will have repeat BMET at the [facility] Friday 4/8.  Lab orders placed.  Appt notes updated for next f/u to have DPR updated.

## 2021-02-26 NOTE — Telephone Encounter (Signed)
-----   Message from Loel Dubonnet, NP sent at 02/26/2021  2:46 PM EDT ----- Potassium noted to be low. Recommend Potassium 71mEq daily x2 days then 40mEq daily.   Recommend repeat BMP, magnesium at the [facility] Friday to ensure it is improving.   Of note, there is a Pharmacist, community message requesting additional lab work from her aunt who is a PA? We will plan to check magnesium as if it is low could cause low potassium. Primary care would have to check vitamin D. We also need to likely have her update DPR when she is seen next.

## 2021-02-26 NOTE — Telephone Encounter (Signed)
Reaching out to patient to offer assistance regarding upcoming cardiac imaging study; pt's husband verbalizes understanding of appt date/time, parking situation and where to check in, pre-test NPO status and medications ordered, and verified current allergies; name and call back number provided for further questions should they arise  Gordy Clement RN Navigator Cardiac Imaging [facility] [phone] office [phone] cell  Pt to hold daily metoprolol and to take 100mg  metoprolol tartrate 2 hours prior to scan

## 2021-02-26 NOTE — Telephone Encounter (Signed)
Called patient to offer to schedule a Palliative Consult with her, no answer and unable to leave a message due to mailbox was full.  I then called and spoke with husband, Marguerite Olea, and told him that I was trying to reach patient and explained why I was calling.  He said that the patient is in a doctors appointment at this time and he requested I call back later this afternoon.

## 2021-02-26 NOTE — Therapy (Signed)
[place] MAIN [facility] SERVICES [address] [place], [place], [address] Phone: [phone]   Fax:  [phone]  Speech Language Pathology Treatment  Patient Details  Name: [name] [name] MRN: [id number] Date of Birth: 1966-04-15 Referring Provider (SLP): Mar[place]e [place], [place]   Encounter Date: 02/25/2021   End of Session - 02/25/21 1606    Visit Number 4    Number of Visits 25    Date for SLP Re-Evaluation 05/06/21    Authorization Type Medicaid    Authorization Time Period 02/11/2021 thru 05/06/2021    Authorization - Visit Number 4    Progress Note Due on Visit 10    SLP Start Time 1500    SLP Stop Time  1600    SLP Time Calculation (min) 60 min    Activity Tolerance Patient tolerated treatment well           Past Medical History:  Diagnosis Date  . Hypertension     Past Surgical History:  Procedure Laterality Date  . PEG PLACEMENT N/A 12/13/2020   Procedure: PERCUTANEOUS ENDOSCOPIC GASTROSTOMY (PEG) PLACEMENT;  Surgeon: Lesly Rubenstein, MD;  Location: [facility] ENDOSCOPY;  Service: Endoscopy;  Laterality: N/A;  . TRACHEOSTOMY TUBE PLACEMENT N/A 12/13/2020   Procedure: TRACHEOSTOMY;  Surgeon: Clyde Canterbury, MD;  Location: [facility] ORS;  Service: ENT;  Laterality: N/A;    There were no vitals filed for this visit.   Subjective Assessment - 02/25/21 1605    Subjective pt pleasant    Patient is accompained by: Family member    Currently in Pain? No/denies            Neuro   ST   TX   NOTE          Treatment Data and Patient's Response to Treatment   Skilled treatment session targeted pt's cognitive communication deficits. Pt arrived to session accompanied by her son. She forgot to bring her calendar to the session and SLP requested for son to remind her to bring it to the next session. SLP facilitated session by providing pt with novel card game of "war". Pt was first required to sort the deck of cards into two piles which the pt  independently and accurately completed. During game play, pt was occasionally unable to determine if one card was a higher value than a second card and required verbal cues to increase her accuracy. Pt was able to independently count her pile of cards; however, her speech intelligiblity remained <50%. SLP then provided pt with a one-word barrier task using picture cards monitoring for speech intelligibility. Pt required verbal cues to increase vocal loudness and reduce rate of speech in 10 out of 23 opportunities. Pt was then given sequencing pictures and required maximal support to accurately place pictures in order, generate a sentence for each picture and increase speech intelligiblity. Maximal phonemic, verbal, and gestural cues were provided to aide pt in generating sentences. Pt continues to require maximal support from SLP and caregivers to carry out basic tasks. Therefore, skilled ST intervention continues to be required to increase pt's functional independence and reduce caregiver burden.         SLP Education - 02/25/21 1605    Education Details speaking at a louder volume, generating sentences based on pictures    Person(s) Educated Patient;Child(ren)    Methods Explanation;Demonstration;Verbal cues    Comprehension Verbalized understanding;Verbal cues required;Tactile cues required;Need further instruction            SLP  Short Term Goals - 02/11/21 1156      SLP SHORT TERM GOAL #1   Title With moderate cues, pt will demonstrate selective attention in mildly distracting environment for 15 minutes.    Baseline sustained attention    Time 10    Period --   sessions   Status New      SLP SHORT TERM GOAL #2   Title With minimal cues, pt will utilize speech intelligibility strategies to achieve > 90% intelligibility at the phrase level.    Baseline moderate assistance to achieve ~ 50% intelligibility at phrase level    Time 10    Period --   sessions   Status New      SLP SHORT  TERM GOAL #3   Title With minimal cues, pt will utilize external memory aids for recall of family members' names and ages with 95% accuracy.    Baseline 50% accurate    Time 10    Period --   sessions   Status New      SLP SHORT TERM GOAL #4   Title With minimal assistance, pt will demonstrate functional problem solving for basic and familiar tasks to achieve >95% accuracy.    Baseline moderate cues    Time 10    Period --   sessions   Status New      SLP SHORT TERM GOAL #5   Title Pt will consume current diet without any reports s/s of dysphagia by pt or family members.    Baseline upgraded to dysphagia 3 diet on 02/11/2021    Time 10    Period --   session   Status New            SLP Long Term Goals - 02/11/21 1202      SLP LONG TERM GOAL #1   Title Pt will consume safest, least restrictive diet without overt s/s of dysphagia, aspiration or respiratory distress.    Baseline upgraded to dysphagia 3 diet on 02/11/2021    Time 12    Period Weeks    Status New    Target Date 05/06/21      SLP LONG TERM GOAL #2   Title Pt will independently complete functional problem solving for basic and familiar tasks.    Baseline moderate assistance    Time 12    Period Weeks    Status New    Target Date 05/06/21      SLP LONG TERM GOAL #3   Title Pt will demonstrate selective attention in a moderately distracting environment for 15 minutes with < 1 redirection to task.    Baseline sustained attention    Time 12    Period Weeks    Status New    Target Date 05/06/21      SLP LONG TERM GOAL #4   Title With minimal cues, pt will increase speech intelligibility to > 75% at the sentence level.    Baseline 50% at the phrase level    Time 12    Period Weeks    Status New    Target Date 05/06/21            Plan - 02/25/21 1607    Clinical Impression Statement Skilled treatment session targeted pt's cognitive communication deficits. Pt arrived to session accompanied by her son.  She forgot to bring her calendar to the session and SLP requested for son to remind her to bring it to the next session. SLP facilitated session  by providing pt with novel card game of "war". Pt was first required to sort the deck of cards into two piles which the pt independently and accurately completed. During game play, pt was occasionally unable to determine if one card was a higher value than a second card and required verbal cues to increase her accuracy. Pt was able to independently count her pile of cards; however, her speech intelligiblity remained <50%. SLP then provided pt with a one-word barrier task using picture cards monitoring for speech intelligibility. Pt required verbal cues to increase vocal loudness and reduce rate of speech in 10 out of 23 opportunities. Pt was then given sequencing pictures and required maximal support to accurately place pictures in order, generate a sentence for each picture and increase speech intelligiblity. Maximal phonemic, verbal, and gestural cues were provided to aide pt in generating sentences. Pt continues to require maximal support from SLP and caregivers to carry out basic tasks. Therefore, skilled ST intervention continues to be required to increase pt's functional independence and reduce caregiver burden.    Speech Therapy Frequency 2x / week    Duration 12 weeks    Treatment/Interventions Diet toleration management by SLP;Cognitive reorganization;Compensatory techniques;Internal/external aids;SLP instruction and feedback;Patient/family education;Functional tasks;Compensatory strategies    Potential to Achieve Goals Good    Potential Considerations Severity of impairments    SLP Home Exercise Plan provided    Consulted and Agree with Plan of Care Patient;Family member/caregiver    Family Member Consulted pt's son           Patient will benefit from skilled therapeutic intervention in order to improve the following deficits and impairments:    Cognitive communication deficit  Anoxic brain injury [facility])    Problem List Patient Active Problem List   Diagnosis Date Noted  . Dysarthria and anarthria 02/11/2021  . Sinus tachycardia   . Labile blood glucose   . Dysphagia   . Seizure prophylaxis   . Malnutrition of moderate degree 01/23/2021  . Anoxic brain injury ([place]) 01/10/2021  . Prediabetes   . Dyslipidemia   . S/P percutaneous endoscopic gastrostomy (PEG) tube placement ([place])   . Status post tracheostomy ([place])   . Aspiration pneumonia of both lower lobes due to gastric secretions ([place]) 01/05/2021  . Acute respiratory failure ([place])   . Hypoxic ischemic encephalopathy   . Endotracheal tube present   . Cardiac arrest ([place]) 12/02/2020  . Essential hypertension 12/09/2011   Shelbe Haglund B. Rutherford Nail M.S., CCC-SLP, [place] Pathologist Rehabilitation Services Office [phone]  Stormy Fabian 02/26/2021, 10:54 AM  [place] MAIN [facility] SERVICES [address] [place], [place], [address] Phone: [phone]   Fax:  [phone]   Name: [name] [name] MRN: [id number] Date of Birth: 20-Aug-1966

## 2021-02-27 ENCOUNTER — Ambulatory Visit
Admission: RE | Admit: 2021-02-27 | Discharge: 2021-02-27 | Disposition: A | Discharge status: Home / Self Care | Payer: Self-pay | Source: Ambulatory Visit | Attending: Family | Admitting: Family

## 2021-02-27 ENCOUNTER — Telehealth: Payer: Self-pay | Admitting: Family

## 2021-02-27 DIAGNOSIS — I4901 Ventricular fibrillation: Secondary | ICD-10-CM | POA: Insufficient documentation

## 2021-02-27 DIAGNOSIS — Z8674 Personal history of sudden cardiac arrest: Secondary | ICD-10-CM

## 2021-02-27 IMAGING — CT CT HEART MORP W/ CTA COR W/ SCORE W/ CA W/CM &/OR W/O CM
2 of 11 series · 7 of 20 positions shown, 8 images · non-contrast
Comparison: None.

Addendum:
CLINICAL DATA: Cardiac arrest

EXAM:
Cardiac/Coronary  CTA
TECHNIQUE: The patient was scanned on a Siemens Somatoform go.Top scanner.

[Series 27: multiphase % cta coronary 0.60 · axial · 0.33mm/px · z∈[-1071,-1012]mm · 3 of 2072 slices shown]
[im 518/2072  vessel]
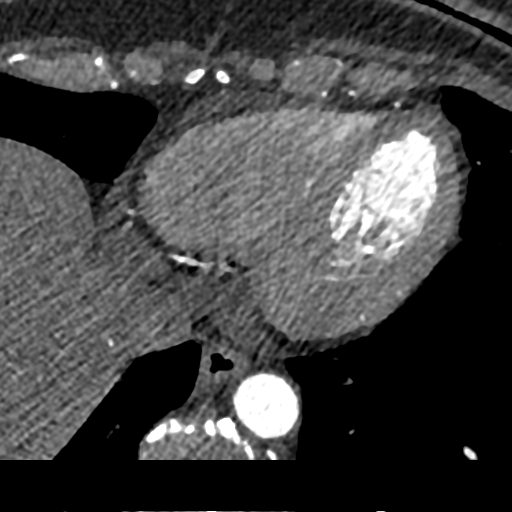
[im 1036/2072  vessel]
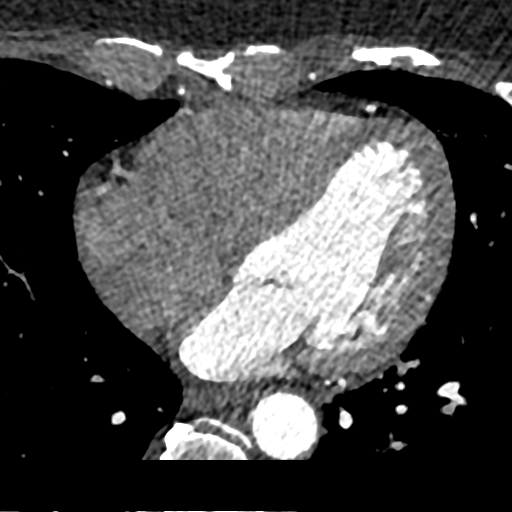
[im 1554/2072  vessel]
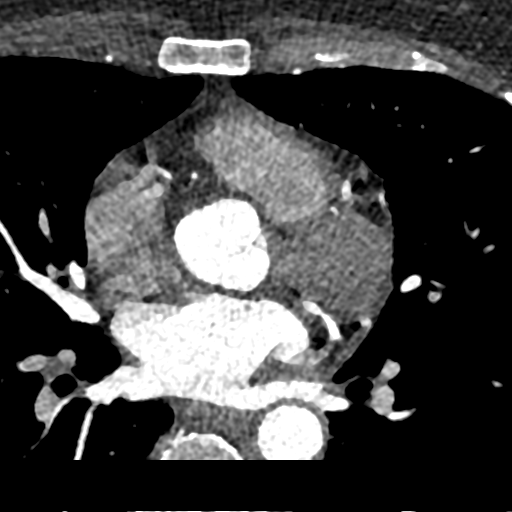

[Series 45: ms multiphase cta coronary 0.60 · axial · 0.33mm/px · z∈[-1077,-1006]mm · 4 of 2368 slices shown, 5 images]
[im 474/2368  vessel]
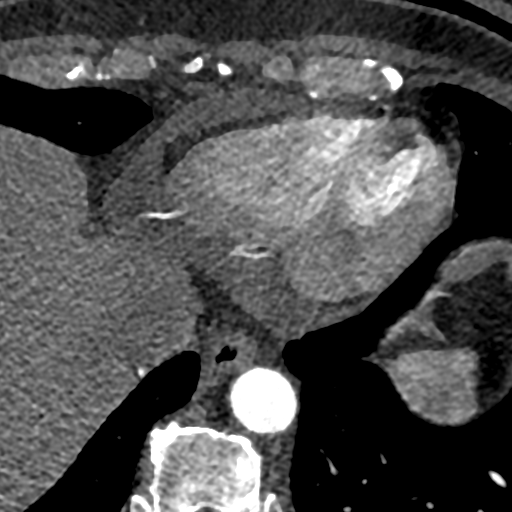
[im 474/2368  lung]
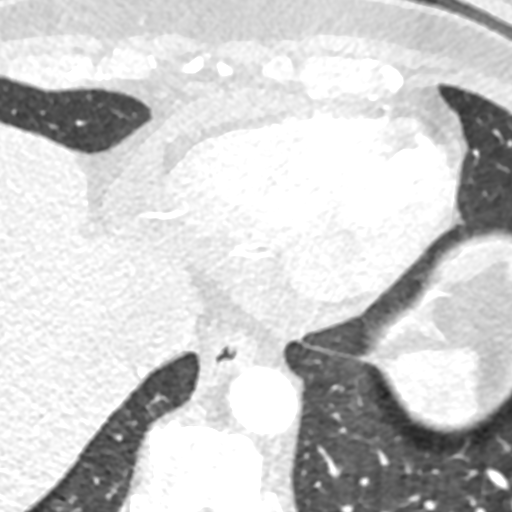
[im 947/2368  vessel]
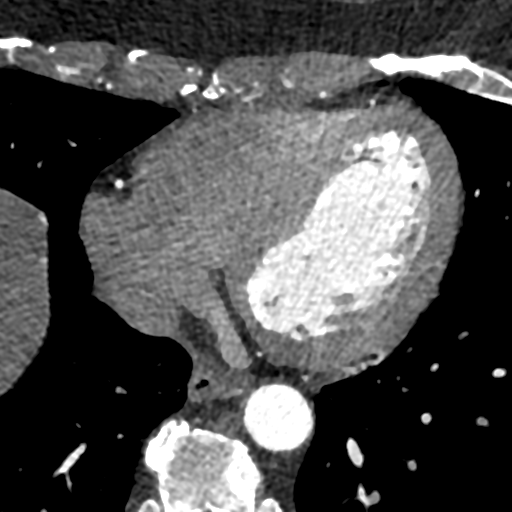
[im 1421/2368  vessel]
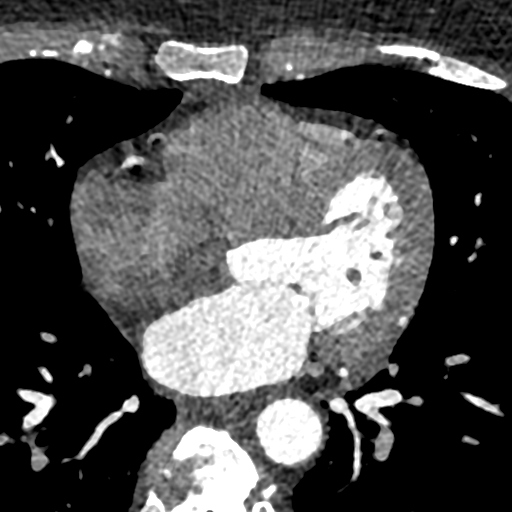
[im 1894/2368  vessel]
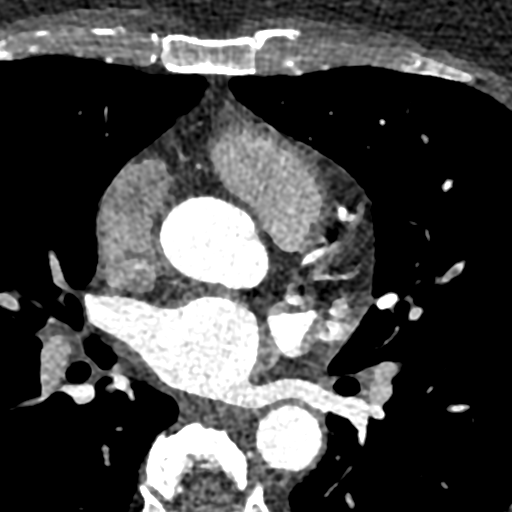

[7 of 20 positions shown; findings below may reference images not displayed]

FINDINGS: A retrospective scan was triggered in the descending thoracic aorta.
Axial non-contrast 3 mm slices were carried out through the heart.
The data set was analyzed on a dedicated work station and scored
using the Agatson method. Gantry rotation speed was 330 msecs and
collimation was .6 mm. 100mg of metoprolol and 0.8 mg of sl NTG was
given. The 3D data set was reconstructed in 5% intervals of the
60-95 % of the R-R cycle. Diastolic phases were analyzed on a
dedicated work station using MPR, MIP and VRT modes. The patient
received 75 cc of contrast.

Aorta:  Normal size.  No calcifications.  No dissection.

Aortic Valve:  Trileaflet.  No calcifications.

Coronary Arteries:  Normal coronary origin.  Right dominance.

RCA is a dominant artery that gives rise to PDA and PLA. There is no
plaque.

Left main is a large artery that gives rise to LAD and LCX arteries.

LAD has no plaque.

LCX is a non-dominant artery that gives rise to two obtuse marginal
branches. There is no plaque.

Other findings:

Normal pulmonary vein drainage into the left atrium.

Normal left atrial appendage without a thrombus.

Normal size of the pulmonary artery.
IMPRESSION: 1. Normal coronary calcium score of 0. Patient is low risk for
coronary events.

2. Normal coronary origin with right dominance.

3. No evidence of CAD.

4. CAD-RADS 0. Consider non-atherosclerotic causes of cardiac
arrest.

EXAM:
OVER-READ INTERPRETATION  CT CHEST

The following report is an over-read performed by radiologist Dr.
does not include interpretation of cardiac or coronary anatomy or
pathology. The coronary calcium score/coronary CTA interpretation by
the cardiologist is attached.
FINDINGS: A lobulated solid nodule is seen in the posterior right lower lobe
which measures 2.2 x 1.9 cm. Multiple other sub-cm pulmonary nodules
are seen in the visualized portions of both lower lungs. No evidence
of pleural effusion.

The visualized portions of the mediastinum and chest wall are
unremarkable.
IMPRESSION: Dominant 2.2 cm pulmonary nodule in the posterior right lower lobe,
with multiple other sub-cm pulmonary nodules bilaterally. Malignancy
cannot be excluded. PET-CT scan is recommended for further
evaluation.

These results will be called to the ordering clinician or
representative by the Radiologist Assistant, and communication
documented in the PACS or [REDACTED].

*** End of Addendum ***
FINDINGS: A retrospective scan was triggered in the descending thoracic aorta.
Axial non-contrast 3 mm slices were carried out through the heart.
The data set was analyzed on a dedicated work station and scored
using the Agatson method. Gantry rotation speed was 330 msecs and
collimation was .6 mm. 100mg of metoprolol and 0.8 mg of sl NTG was
given. The 3D data set was reconstructed in 5% intervals of the
60-95 % of the R-R cycle. Diastolic phases were analyzed on a
dedicated work station using MPR, MIP and VRT modes. The patient
received 75 cc of contrast.

Aorta:  Normal size.  No calcifications.  No dissection.

Aortic Valve:  Trileaflet.  No calcifications.

Coronary Arteries:  Normal coronary origin.  Right dominance.

RCA is a dominant artery that gives rise to PDA and PLA. There is no
plaque.

Left main is a large artery that gives rise to LAD and LCX arteries.

LAD has no plaque.

LCX is a non-dominant artery that gives rise to two obtuse marginal
branches. There is no plaque.

Other findings:

Normal pulmonary vein drainage into the left atrium.

Normal left atrial appendage without a thrombus.

Normal size of the pulmonary artery.
IMPRESSION: 1. Normal coronary calcium score of 0. Patient is low risk for
coronary events.

2. Normal coronary origin with right dominance.

3. No evidence of CAD.

4. CAD-RADS 0. Consider non-atherosclerotic causes of cardiac
arrest.

## 2021-02-27 MED ORDER — NITROGLYCERIN 0.4 MG SL SUBL
0.8000 mg | SUBLINGUAL_TABLET | Freq: Once | SUBLINGUAL | Status: AC
  Administered 2021-02-27: 0.8 mg via SUBLINGUAL

## 2021-02-27 MED ORDER — IOHEXOL 350 MG/ML SOLN
75.0000 mL | Freq: Once | INTRAVENOUS | Status: AC | PRN
  Administered 2021-02-27: 75 mL via INTRAVENOUS

## 2021-02-27 MED ORDER — METOPROLOL TARTRATE 5 MG/5ML IV SOLN
10.0000 mg | Freq: Once | INTRAVENOUS | Status: AC
  Administered 2021-02-27: 10 mg via INTRAVENOUS

## 2021-02-27 NOTE — Telephone Encounter (Signed)
Radiology calling with critical results Transferred to [facility]

## 2021-02-27 NOTE — Telephone Encounter (Signed)
Received critical result from [facility] Radiology from pt's cardiac CT today.  Dominant 2.2 cm pulmonary nodule posterior right lower lobe with multiple other subcentimeter pulmonary nodules bilaterally.  PET CT scan recommended for further eval.  Provider made aware and forwarding note.

## 2021-02-27 NOTE — Telephone Encounter (Signed)
Called and spoke with husband regarding result. Verbalized understanding of result and need for for PET scan for further evaluation. I have forwarded testing to PCP with notation that she need PET scan facilitated quickly. Instructed Mr. Boxell to reach out to PCP if he had not heard from them by Monday and he was agreeable.   Loel Dubonnet, NP

## 2021-02-27 NOTE — Addendum Note (Signed)
Addended by: Darlyne Russian on: 02/27/2021 08:16 AM   Modules accepted: Orders

## 2021-02-27 NOTE — Progress Notes (Signed)
Patient tolerated procedure well. Ambulate w/o difficulty. Sitting in chair drinking water provided. Encouraged to drink extra water today and reasoning explained. Verbalized understanding. All questions answered. ABC intact. No further needs. Discharge from procedure area w/o issues.  

## 2021-02-27 NOTE — Telephone Encounter (Signed)
Addendum created to also order Magnesium that will be done with [facility] Friday 4/8.

## 2021-02-28 ENCOUNTER — Other Ambulatory Visit
Admission: RE | Admit: 2021-02-28 | Discharge: 2021-02-28 | Disposition: A | Discharge status: Home / Self Care | Payer: Self-pay | Attending: Family | Admitting: Family

## 2021-02-28 ENCOUNTER — Other Ambulatory Visit: Payer: Self-pay

## 2021-02-28 ENCOUNTER — Telehealth: Payer: Self-pay | Admitting: Family

## 2021-02-28 ENCOUNTER — Ambulatory Visit: Payer: Self-pay

## 2021-02-28 ENCOUNTER — Telehealth: Payer: Self-pay | Admitting: *Deleted

## 2021-02-28 ENCOUNTER — Ambulatory Visit: Payer: Self-pay | Admitting: Speech Pathology

## 2021-02-28 DIAGNOSIS — E876 Hypokalemia: Secondary | ICD-10-CM

## 2021-02-28 DIAGNOSIS — Z79899 Other long term (current) drug therapy: Secondary | ICD-10-CM

## 2021-02-28 DIAGNOSIS — R41841 Cognitive communication deficit: Secondary | ICD-10-CM

## 2021-02-28 DIAGNOSIS — G931 Anoxic brain damage, not elsewhere classified: Secondary | ICD-10-CM

## 2021-02-28 DIAGNOSIS — Z8674 Personal history of sudden cardiac arrest: Secondary | ICD-10-CM

## 2021-02-28 LAB — BASIC METABOLIC PANEL
Anion gap: 8 (ref 5–15)
BUN: 8 mg/dL (ref 6–20)
CO2: 26 mmol/L (ref 22–32)
Calcium: 9.2 mg/dL (ref 8.9–10.3)
Chloride: 107 mmol/L (ref 98–111)
Creatinine, Ser: 0.49 mg/dL (ref 0.44–1.00)
GFR, Estimated: >60 mL/min (ref >60)
Glucose, Bld: 111 mg/dL — ABNORMAL HIGH (ref 70–99)
Potassium: 3.5 mmol/L (ref 3.5–5.1)
Sodium: 141 mmol/L (ref 135–145)

## 2021-02-28 MED ORDER — AMLODIPINE BESYLATE 10 MG PO TABS: 10.0000 mg | ORAL_TABLET | Freq: Every day | ORAL | 5 refills | Status: DC

## 2021-02-28 MED ORDER — ATORVASTATIN CALCIUM 80 MG PO TABS: 80.0000 mg | ORAL_TABLET | Freq: Every day | ORAL | 5 refills | Status: DC

## 2021-02-28 MED ORDER — METOPROLOL TARTRATE 25 MG PO TABS: 25.0000 mg | ORAL_TABLET | Freq: Two times a day (BID) | ORAL | 5 refills | Status: DC

## 2021-02-28 MED ORDER — POTASSIUM CHLORIDE CRYS ER 20 MEQ PO TBCR: 40.0000 meq | EXTENDED_RELEASE_TABLET | Freq: Every day | ORAL | 5 refills | Status: DC

## 2021-02-28 NOTE — Telephone Encounter (Signed)
Call primary care office [facility] and was able to speak with RN. Discussed CT result showing pulmonary nodules with recommendation for PET. Requested PCP faciltiate PET scan. If they are unable to facilitate, provided our contact number to call back.   CT 02/27/21 Radiology read:   FINDINGS: A lobulated solid nodule is seen in the posterior right lower lobe which measures 2.2 x 1.9 cm. Multiple other sub-cm pulmonary nodules are seen in the visualized portions of both lower lungs. No evidence of pleural effusion.   The visualized portions of the mediastinum and chest wall are unremarkable.   IMPRESSION: Dominant 2.2 cm pulmonary nodule in the posterior right lower lobe, with multiple other sub-cm pulmonary nodules bilaterally. Malignancy cannot be excluded. PET-CT scan is recommended for further evaluation.  Cardiology read: Aorta:  Normal size.  No calcifications.  No dissection.   Aortic Valve:  Trileaflet.  No calcifications.   Coronary Arteries:  Normal coronary origin.  Right dominance.   RCA is a dominant artery that gives rise to PDA and PLA. There is no plaque.   Left main is a large artery that gives rise to LAD and LCX arteries.   LAD has no plaque.   LCX is a non-dominant artery that gives rise to two obtuse marginal branches. There is no plaque.   Other findings:   Normal pulmonary vein drainage into the left atrium.   Normal left atrial appendage without a thrombus.   Normal size of the pulmonary artery.  IMPRESSION: 1. Normal coronary calcium score of 0. Patient is low risk for coronary events.   2. Normal coronary origin with right dominance.   3. No evidence of CAD.   4. CAD-RADS 0. Consider non-atherosclerotic causes of cardiac arrest.   Loel Dubonnet, NP

## 2021-02-28 NOTE — Telephone Encounter (Signed)
Spoke to pt's caregiver, nurse and husband in home with pt on speaker phone, notified of results and provider's recc.  Pt, husband and nurse verbalized understanding. Pt will continue Potassium 17mEq daily and have repeat BMET in 1-2 weeks at the [facility] at [facility].  Lab orders placed. And refills sent per pt request.  No further questions at this time.

## 2021-02-28 NOTE — Telephone Encounter (Signed)
-----   Message from Loel Dubonnet, NP sent at 02/28/2021  3:17 PM EDT ----- Potassium function has returned to low normal. Recommend continuing Potassium 70mEq daily. I have requested a magnesium be added onto lab work and it is still pending. Will need repeat BMP in 1-2 weeks either with PCP or in the [place].

## 2021-02-28 NOTE — Therapy (Addendum)
[place] MAIN [facility] SERVICES [address] [place], [place], [address] Phone: [phone]   Fax:  [phone]  Speech Language Pathology Treatment  Patient Details  Name: [name] [name] MRN: [id number] Date of Birth: January 12, 1966 Referring Provider (SLP): Marlowe Shores, [place]   Encounter Date: 02/28/2021   End of Session - 02/28/21 1708    Visit Number 5    Number of Visits 25    Date for SLP Re-Evaluation 05/06/21    Authorization Type Medicaid    Authorization Time Period 02/11/2021 thru 05/06/2021    Authorization - Visit Number 5    Progress Note Due on Visit 10    SLP Start Time 1100    SLP Stop Time  1200    SLP Time Calculation (min) 60 min    Activity Tolerance Patient tolerated treatment well           Past Medical History:  Diagnosis Date  . Hypertension     Past Surgical History:  Procedure Laterality Date  . PEG PLACEMENT N/A 12/13/2020   Procedure: PERCUTANEOUS ENDOSCOPIC GASTROSTOMY (PEG) PLACEMENT;  Surgeon: Lesly Rubenstein, MD;  Location: [facility] ENDOSCOPY;  Service: Endoscopy;  Laterality: N/A;  . TRACHEOSTOMY TUBE PLACEMENT N/A 12/13/2020   Procedure: TRACHEOSTOMY;  Surgeon: Clyde Canterbury, MD;  Location: [facility] ORS;  Service: ENT;  Laterality: N/A;    There were no vitals filed for this visit.   Subjective Assessment - 02/28/21 1708    Subjective pt pleasant    Patient is accompained by: Family member    Currently in Pain? No/denies             Neuro   ST   TX   NOTE          Treatment Data and Patient's Response to Treatment   Skilled treatment session focused on pt's cognitive communication goals. SLP facilitated session by providing maximal faded to moderate cues to sequence 3 basic picture cards in appropriate order. Basic descriptive phrase/sentence were provided for each picture card. Pt required moderate cues to decrease impulsivity and rate when reading each statement to increase speech intelligibility  to ~ 75%. With continued practice, pt's ability increased with each part of task. After a delay, pt was able to replicate activity with increased ability.     Pt continues presenting with severe cognitive communication impairments that are c/b severely reduced speech intelligibility as well as cognitive impairments in the areas of basic problem solving, self-monitoring, self-correcting. As such, skilled ST intervention is required to target these deficits and increase functional independence thereby reducing caregiver burden    SLP Education - 02/28/21 1708    Education Details provided    Person(s) Educated Patient;Spouse    Methods Explanation;Demonstration;Verbal cues    Comprehension Verbalized understanding;Need further instruction;Verbal cues required            SLP Short Term Goals - 02/11/21 1156      SLP SHORT TERM GOAL #1   Title With moderate cues, pt will demonstrate selective attention in mildly distracting environment for 15 minutes.    Baseline sustained attention    Time 10    Period --   sessions   Status New      SLP SHORT TERM GOAL #2   Title With minimal cues, pt will utilize speech intelligibility strategies to achieve > 90% intelligibility at the phrase level.    Baseline moderate assistance to achieve ~ 50% intelligibility at phrase level    Time  10    Period --   sessions   Status New      SLP SHORT TERM GOAL #3   Title With minimal cues, pt will utilize external memory aids for recall of family members' names and ages with 95% accuracy.    Baseline 50% accurate    Time 10    Period --   sessions   Status New      SLP SHORT TERM GOAL #4   Title With minimal assistance, pt will demonstrate functional problem solving for basic and familiar tasks to achieve >95% accuracy.    Baseline moderate cues    Time 10    Period --   sessions   Status New      SLP SHORT TERM GOAL #5   Title Pt will consume current diet without any reports s/s of dysphagia by  pt or family members.    Baseline upgraded to dysphagia 3 diet on 02/11/2021    Time 10    Period --   session   Status New            SLP Long Term Goals - 02/11/21 1202      SLP LONG TERM GOAL #1   Title Pt will consume safest, least restrictive diet without overt s/s of dysphagia, aspiration or respiratory distress.    Baseline upgraded to dysphagia 3 diet on 02/11/2021    Time 12    Period Weeks    Status New    Target Date 05/06/21      SLP LONG TERM GOAL #2   Title Pt will independently complete functional problem solving for basic and familiar tasks.    Baseline moderate assistance    Time 12    Period Weeks    Status New    Target Date 05/06/21      SLP LONG TERM GOAL #3   Title Pt will demonstrate selective attention in a moderately distracting environment for 15 minutes with < 1 redirection to task.    Baseline sustained attention    Time 12    Period Weeks    Status New    Target Date 05/06/21      SLP LONG TERM GOAL #4   Title With minimal cues, pt will increase speech intelligibility to > 75% at the sentence level.    Baseline 50% at the phrase level    Time 12    Period Weeks    Status New    Target Date 05/06/21            Plan - 02/28/21 1709    Speech Therapy Frequency 2x / week    Duration 12 weeks    Treatment/Interventions Diet toleration management by SLP;Cognitive reorganization;Compensatory techniques;Internal/external aids;SLP instruction and feedback;Patient/family education;Functional tasks;Compensatory strategies    Potential to Achieve Goals Good    Potential Considerations Severity of impairments    SLP Home Exercise Plan provided    Consulted and Agree with Plan of Care Patient;Family member/caregiver    Family Member Consulted pt's husband           Patient will benefit from skilled therapeutic intervention in order to improve the following deficits and impairments:   Cognitive communication deficit  Anoxic brain injury  [facility])    Problem List Patient Active Problem List   Diagnosis Date Noted  . Dysarthria and anarthria 02/11/2021  . Sinus tachycardia   . Labile blood glucose   . Dysphagia   . Seizure prophylaxis   .  Malnutrition of moderate degree 01/23/2021  . Anoxic brain injury ([place]) 01/10/2021  . Prediabetes   . Dyslipidemia   . S/P percutaneous endoscopic gastrostomy (PEG) tube placement ([place])   . Status post tracheostomy ([place])   . Aspiration pneumonia of both lower lobes due to gastric secretions ([place]) 01/05/2021  . Acute respiratory failure ([place])   . Hypoxic ischemic encephalopathy   . Endotracheal tube present   . Cardiac arrest ([place]) 12/02/2020  . Essential hypertension 12/09/2011   Kahiau Schewe B. Rutherford Nail M.S., CCC-SLP, [place] Pathologist Rehabilitation Services Office [phone]  Stormy Fabian 02/28/2021, 5:10 PM  [place] MAIN [facility] SERVICES [address] [place], [place], [address] Phone: [phone]   Fax:  [phone]   Name: [name] [name] MRN: [id number] Date of Birth: May 14, 1966

## 2021-03-04 NOTE — Telephone Encounter (Signed)
Sharyn Lull from [facility] calling for [name] [name]  Has further questions in regards to discussion on Friday with Vella Raring Please call [phone]

## 2021-03-04 NOTE — Telephone Encounter (Signed)
Spoke to [place] with Boykin Reaper at [facility].  Pt is going to follow up with Mimi on 03/13/21.  Sharyn Lull wanted to clarify that pt has not already had PET scan. She confirmed that Mimi will be glad to facilitate PET scan at follow up and is aware that pt has not had done elsewhere.

## 2021-03-05 ENCOUNTER — Ambulatory Visit: Payer: Self-pay | Admitting: Speech Pathology

## 2021-03-05 ENCOUNTER — Ambulatory Visit: Payer: Self-pay

## 2021-03-05 DIAGNOSIS — R41841 Cognitive communication deficit: Secondary | ICD-10-CM

## 2021-03-05 DIAGNOSIS — G931 Anoxic brain damage, not elsewhere classified: Secondary | ICD-10-CM

## 2021-03-05 DIAGNOSIS — R262 Difficulty in walking, not elsewhere classified: Secondary | ICD-10-CM

## 2021-03-05 DIAGNOSIS — R2689 Other abnormalities of gait and mobility: Secondary | ICD-10-CM

## 2021-03-05 DIAGNOSIS — R269 Unspecified abnormalities of gait and mobility: Secondary | ICD-10-CM

## 2021-03-05 DIAGNOSIS — M6281 Muscle weakness (generalized): Secondary | ICD-10-CM

## 2021-03-05 NOTE — Therapy (Signed)
[place] MAIN [facility] SERVICES [address] [place], [place], [address] Phone: [phone]   Fax:  [phone]  Physical Therapy Treatment  Patient Details  Name: [name] [name] MRN: [id number] Date of Birth: 11-Dec-1965 Referring Provider (PT): D. [place]- PA-C   Encounter Date: 03/05/2021   PT End of Session - 03/05/21 1604    Visit Number 3    Number of Visits 25    Date for PT Re-Evaluation 05/08/21    Authorization Type 03/24/202- 05/08/2021    PT Start Time 1600    PT Stop Time 1645    PT Time Calculation (min) 45 min    Equipment Utilized During Treatment Gait belt    Activity Tolerance Patient tolerated treatment well    Behavior During Therapy WFL for tasks assessed/performed           Past Medical History:  Diagnosis Date  . Hypertension     Past Surgical History:  Procedure Laterality Date  . PEG PLACEMENT N/A 12/13/2020   Procedure: PERCUTANEOUS ENDOSCOPIC GASTROSTOMY (PEG) PLACEMENT;  Surgeon: Lesly Rubenstein, MD;  Location: [facility] ENDOSCOPY;  Service: Endoscopy;  Laterality: N/A;  . TRACHEOSTOMY TUBE PLACEMENT N/A 12/13/2020   Procedure: TRACHEOSTOMY;  Surgeon: Clyde Canterbury, MD;  Location: [facility] ORS;  Service: ENT;  Laterality: N/A;    There were no vitals filed for this visit.   Subjective Assessment - 03/05/21 1603    Subjective Patient husband reports her potassium levels are back up and going back tomorrow for follow up    Patient is accompained by: Family member   Husband   Pertinent History Patient experienced a Cardiac arrest on 12/02/2020 with subsequent anoxic brain injury requiring intubation.    Limitations Lifting;Standing;Walking;Writing;House hold activities    How long can you sit comfortably? No issues    How long can you stand comfortably? Limited due to fatigue.    How long can you walk comfortably? Limited secondary to fatigue.    Patient Stated Goals Improve my balance and mobility back to  independent    Currently in Pain? No/denies          Interventions: Therex: Seated hip march 4lb. BLE x 15 reps Seated Knee ext 4lb. BLE x 16 reps   Standing LE strengthening at //bars:  All using 4lb AW BLE Standing march Standing hip abd Standing hip ext Standing calf raises 10 reps each with  VC and visual demo for correct technique. Education provided throughout session via VC/TC and demonstration to facilitate movement at target joints and correct muscle activation for all testing and exercises performed.   Neuromuscular re-ed: In //bars  Dynamic tapping on 4 colored cones- Patient able to tap on colored cones on command without significant difficulty  Forward/backward stepping over orange hurdle Side stepping over hurdle. Only initial difficulty coordinating steps today yet improved to no difficulty x 20 reps each direction. Forward step up on blue airex pad with 1 UE support x 10 reps  Obstacle course- step up onto 6" step , narrowed gait between cones, gait around cones, wide gait using foam roll. 4 trials with no  Loss of balance and improved sequencing with each trial.   Dynamic walking with head turns- mild decreased gait velocity Dynamic walking with prompts for stop and turn x 4- mild dificulty slowing down. Dynamic walking - retro gait x 20 feet x  4 sets - increased unsteadiness yet no loss of balance.   Clinical Impression: Patient responded well to  strengthening exercises and exhibited much improved coordination with all activities today. She was challenged with dynamic gait activities and will benefit from continued PT services to improve her safety with all functional mobility.                      PT Education - 03/06/21 2128    Education Details specific exercise form    Person(s) Educated Patient    Methods Explanation;Demonstration;Tactile cues;Verbal cues    Comprehension Verbalized understanding;Returned demonstration;Verbal cues  required;Tactile cues required;Need further instruction            PT Short Term Goals - 02/13/21 2239      PT SHORT TERM GOAL #1   Title Pt will be independent with HEP in order to improve strength and balance in order to decrease fall risk and improve function at home and work.    Baseline 02/13/2021- Patient has no formal HEP in place.    Time 6    Period Weeks    Status New    Target Date 03/27/21             PT Long Term Goals - 02/13/21 2237      PT LONG TERM GOAL #1   Title Pt will improve FOTO to target score to 77 to display perceived improvements in ability to complete ADL's.    Baseline 02/13/2021= FOTO score-69    Time 12    Period Weeks    Status New    Target Date 05/08/21      PT LONG TERM GOAL #2   Title Pt will decrease 5TSTS by at least 3 seconds in order to demonstrate clinically significant improvement in LE strength.    Baseline 02/13/2021= 15.0 sec without UE support    Time 12    Period Weeks    Status New    Target Date 05/08/21      PT LONG TERM GOAL #3   Title Patient will increase RLE gross strength to 4+/5 as to improve functional strength for independent gait, increased standing tolerance and increased ADL ability.    Baseline 02/13/2021- Patient presents with 4/5 right Hip flex/knee flex/ext, ankle DF    Time 12    Period Weeks    Status New    Target Date 05/08/21      PT LONG TERM GOAL #4   Title Patient will be modified independent in walking on even/uneven surface with least restrictive assistive device, for 10+ minutes without rest break, reporting some difficulty or less to improve walking tolerance with community ambulation including grocery shopping, going to [place] 02/13/2021- Patient presents with limited ambulation around 120 feet with decreased coordination and fatigue.    Time 12    Period Weeks    Status New    Target Date 05/08/21      PT LONG TERM GOAL #5   Title Patient will increase Functional Gait  Assessment score to >15 /30 as to reduce fall risk and improve dynamic gait safety with community ambulation.    Baseline 02/13/2021: FGA= 8/20    Time 12    Period Weeks    Status New    Target Date 05/08/21                 Plan - 03/05/21 1605    Clinical Impression Statement Patient responded well to strengthening exercises and exhibited much improved coordination with all activities today. She was challenged with dynamic gait  activities and will benefit from continued PT services to improve her safety with all functional mobility.    Personal Factors and Comorbidities Comorbidity 3+    Comorbidities Anoxic brain injury, Sinus tachycardia, Dysphagia    Examination-Activity Limitations Caring for Others;Carry;Lift;Squat;Stairs    Examination-Participation Restrictions Cleaning;Community Activity;Driving;Laundry;Occupation;Personal Finances;Yard Work    Stability/Clinical Decision Making Stable/Uncomplicated    Rehab Potential Good    PT Frequency 2x / week    PT Duration 12 weeks    PT Treatment/Interventions ADLs/Self Care Home Management;Cryotherapy;Moist Heat;DME Instruction;Gait training;Stair training;Functional mobility training;Therapeutic activities;Therapeutic exercise;Balance training;Neuromuscular re-education;Cognitive remediation;Patient/family education    PT Next Visit Plan Initiate LE strengthening, Coordination/balance activities.    PT Home Exercise Plan Added standing LE strengthening HEP today and issued handout    Consulted and Agree with Plan of Care Patient;Family member/caregiver    Family Member Consulted Patient son           Patient will benefit from skilled therapeutic intervention in order to improve the following deficits and impairments:  Abnormal gait,Decreased activity tolerance,Decreased balance,Decreased cognition,Decreased coordination,Decreased endurance,Decreased mobility,Difficulty walking,Decreased strength  Visit Diagnosis: Abnormality  of gait and mobility  Difficulty in walking, not elsewhere classified  Muscle weakness (generalized)  Other abnormalities of gait and mobility     Problem List Patient Active Problem List   Diagnosis Date Noted  . Dysarthria and anarthria 02/11/2021  . Sinus tachycardia   . Labile blood glucose   . Dysphagia   . Seizure prophylaxis   . Malnutrition of moderate degree 01/23/2021  . Anoxic brain injury ([place]) 01/10/2021  . Prediabetes   . Dyslipidemia   . S/P percutaneous endoscopic gastrostomy (PEG) tube placement ([place])   . Status post tracheostomy ([place])   . Aspiration pneumonia of both lower lobes due to gastric secretions ([place]) 01/05/2021  . Acute respiratory failure ([place])   . Hypoxic ischemic encephalopathy   . Endotracheal tube present   . Cardiac arrest ([place]) 12/02/2020  . Essential hypertension 12/09/2011    Lewis Moccasin, PT 03/06/2021, 9:51 PM  [place] MAIN [facility] SERVICES [address] [place], [place], [address] Phone: [phone]   Fax:  [phone]  Name: [name] [name] MRN: [id number] Date of Birth: 1966-02-18

## 2021-03-06 ENCOUNTER — Other Ambulatory Visit: Payer: Self-pay | Admitting: Physician Assistant

## 2021-03-06 DIAGNOSIS — R911 Solitary pulmonary nodule: Secondary | ICD-10-CM

## 2021-03-06 NOTE — Therapy (Addendum)
[place] MAIN [facility] SERVICES [address] [place], [place], [address] Phone: [phone]   Fax:  [phone]  Speech Language Pathology Treatment  Patient Details  Name: [name] [name] MRN: [id number] Date of Birth: June 17, 1966 Referring Provider (SLP): Marlowe Sh[place], [place]   Encounter Date: 03/05/2021   End of Session - 03/06/21 1307    Visit Number 6    Number of Visits 25    Date for SLP Re-Evaluation 05/06/21    Authorization Type Medicaid    Authorization Time Period 02/11/2021 thru 05/06/2021    Authorization - Visit Number 6    Progress Note Due on Visit 10    SLP Start Time 1500    SLP Stop Time  1600    SLP Time Calculation (min) 60 min    Activity Tolerance Patient tolerated treatment well           Past Medical History:  Diagnosis Date  . Hypertension     Past Surgical History:  Procedure Laterality Date  . PEG PLACEMENT N/A 12/13/2020   Procedure: PERCUTANEOUS ENDOSCOPIC GASTROSTOMY (PEG) PLACEMENT;  Surgeon: Lesly Rubenstein, MD;  Location: [facility] ENDOSCOPY;  Service: Endoscopy;  Laterality: N/A;  . TRACHEOSTOMY TUBE PLACEMENT N/A 12/13/2020   Procedure: TRACHEOSTOMY;  Surgeon: Clyde Canterbury, MD;  Location: [facility] ORS;  Service: ENT;  Laterality: N/A;    There were no vitals filed for this visit.   Subjective Assessment - 03/06/21 1306    Subjective pt pleasant, provides feedback that they found her curlers    Patient is accompained by: Family member    Currently in Pain? No/denies            Neuro   ST   TX   NOTE          Treatment Data and Patient's Response to Treatment     Skilled treatment session targeted pt's cognitive communicaiton goals. SLP faciltiated session by providing 3 sequence cards. With initial maximal faded to minimal cues, pt able to put cards in correct order. With maximal faded to minimal cues, pt able to generate functional description of action within each sequence card. At the end of  session, all sequence cards were reviewed with pt only requiring minimal cues for placement and description. Pt's speech intelligibility continues to be greatly reduced despite repeated cues. When describing picture card, she was ~ 50% intelligible with cues. SLP further facilitated session by asking pt orientation questions which she answered accurately and independently!  Pt made great progress over the course of this session and over the course of all ST session she has gained the ability to recall all orientation information accurately. Her prognosis for continued improvement continues to be great despite her moderate to severe cognitive communicaiton deficits in basic problem solving, memory, awareness, attention and speech intelligibility. Skilled ST intervention continues to be required to target these deficits, increase pt's functional independence and reduce caregiver burden.        SLP Education - 03/06/21 1306    Education Details information to include when describing sequence cards    Person(s) Educated Patient;Spouse    Methods Explanation;Demonstration;Verbal cues    Comprehension Verbalized understanding;Verbal cues required;Need further instruction            SLP Short Term Goals - 02/11/21 1156      SLP SHORT TERM GOAL #1   Title With moderate cues, pt will demonstrate selective attention in mildly distracting environment for 15 minutes.    Baseline  sustained attention    Time 10    Period --   sessions   Status New      SLP SHORT TERM GOAL #2   Title With minimal cues, pt will utilize speech intelligibility strategies to achieve > 90% intelligibility at the phrase level.    Baseline moderate assistance to achieve ~ 50% intelligibility at phrase level    Time 10    Period --   sessions   Status New      SLP SHORT TERM GOAL #3   Title With minimal cues, pt will utilize external memory aids for recall of family members' names and ages with 95% accuracy.    Baseline  50% accurate    Time 10    Period --   sessions   Status New      SLP SHORT TERM GOAL #4   Title With minimal assistance, pt will demonstrate functional problem solving for basic and familiar tasks to achieve >95% accuracy.    Baseline moderate cues    Time 10    Period --   sessions   Status New      SLP SHORT TERM GOAL #5   Title Pt will consume current diet without any reports s/s of dysphagia by pt or family members.    Baseline upgraded to dysphagia 3 diet on 02/11/2021    Time 10    Period --   session   Status New            SLP Long Term Goals - 02/11/21 1202      SLP LONG TERM GOAL #1   Title Pt will consume safest, least restrictive diet without overt s/s of dysphagia, aspiration or respiratory distress.    Baseline upgraded to dysphagia 3 diet on 02/11/2021    Time 12    Period Weeks    Status New    Target Date 05/06/21      SLP LONG TERM GOAL #2   Title Pt will independently complete functional problem solving for basic and familiar tasks.    Baseline moderate assistance    Time 12    Period Weeks    Status New    Target Date 05/06/21      SLP LONG TERM GOAL #3   Title Pt will demonstrate selective attention in a moderately distracting environment for 15 minutes with < 1 redirection to task.    Baseline sustained attention    Time 12    Period Weeks    Status New    Target Date 05/06/21      SLP LONG TERM GOAL #4   Title With minimal cues, pt will increase speech intelligibility to > 75% at the sentence level.    Baseline 50% at the phrase level    Time 12    Period Weeks    Status New    Target Date 05/06/21            Plan - 03/06/21 1313    Clinical Impression Statement Pt made great progress over the course of this session and over the course of all ST session she has gained the ability to recall all orientation information accurately. Her prognosis for continued improvement continues to be great despite her moderate to severe cognitive  communicaiton deficits in basic problem solving, memory, awareness, attention and speech intelligibility. Skilled ST intervention continues to be required to target these deficits, increase pt's functional independence and reduce caregiver burden.    Speech Therapy Frequency  2x / week    Duration 12 weeks    Treatment/Interventions Diet toleration management by SLP;Cognitive reorganization;Compensatory techniques;Internal/external aids;SLP instruction and feedback;Patient/family education;Functional tasks;Compensatory strategies    Potential to Achieve Goals Good    Potential Considerations Severity of impairments    SLP Home Exercise Plan provided    Consulted and Agree with Plan of Care Patient;Family member/caregiver    Family Member Consulted pt's husband           Patient will benefit from skilled therapeutic intervention in order to improve the following deficits and impairments:   Cognitive communication deficit  Anoxic brain injury [facility])    Problem List Patient Active Problem List   Diagnosis Date Noted  . Dysarthria and anarthria 02/11/2021  . Sinus tachycardia   . Labile blood glucose   . Dysphagia   . Seizure prophylaxis   . Malnutrition of moderate degree 01/23/2021  . Anoxic brain injury ([place]) 01/10/2021  . Prediabetes   . Dyslipidemia   . S/P percutaneous endoscopic gastrostomy (PEG) tube placement ([place])   . Status post tracheostomy ([place])   . Aspiration pneumonia of both lower lobes due to gastric secretions ([place]) 01/05/2021  . Acute respiratory failure ([place])   . Hypoxic ischemic encephalopathy   . Endotracheal tube present   . Cardiac arrest ([place]) 12/02/2020  . Essential hypertension 12/09/2011   Hollan Philipp B. Rutherford Nail M.S., CCC-SLP, [place] Pathologist Rehabilitation Services Office [phone]  Stormy Fabian 03/06/2021, 1:14 PM  [place] MAIN [facility] SERVICES [address] [place], [place],  [address] Phone: [phone]   Fax:  [phone]   Name: [name] [name] MRN: [id number] Date of Birth: 1966/01/06

## 2021-03-07 ENCOUNTER — Other Ambulatory Visit: Payer: Self-pay

## 2021-03-07 ENCOUNTER — Ambulatory Visit: Payer: Self-pay

## 2021-03-07 ENCOUNTER — Encounter: Payer: Self-pay | Admitting: Occupational Therapy

## 2021-03-07 ENCOUNTER — Ambulatory Visit: Payer: Self-pay | Admitting: Speech Pathology

## 2021-03-07 DIAGNOSIS — R2689 Other abnormalities of gait and mobility: Secondary | ICD-10-CM

## 2021-03-07 DIAGNOSIS — M6281 Muscle weakness (generalized): Secondary | ICD-10-CM

## 2021-03-07 DIAGNOSIS — R2681 Unsteadiness on feet: Secondary | ICD-10-CM

## 2021-03-07 NOTE — Therapy (Signed)
[place] MAIN [facility] SERVICES [address] [place], [place], [address] Phone: [phone]   Fax:  [phone]  Physical Therapy Treatment  Patient Details  Name: [name] [name] MRN: [id number] Date of Birth: Sep 23, 1966 Referring Provider (PT): D. [place]- PA-C   Encounter Date: 03/07/2021   PT End of Session - 03/07/21 1059    Visit Number 4    Number of Visits 25    Date for PT Re-Evaluation 05/08/21    Authorization Type 03/24/202- 05/08/2021    PT Start Time 1003    PT Stop Time 1046    PT Time Calculation (min) 43 min    Equipment Utilized During Treatment Gait belt    Activity Tolerance Patient tolerated treatment well    Behavior During Therapy WFL for tasks assessed/performed           Past Medical History:  Diagnosis Date  . Hypertension     Past Surgical History:  Procedure Laterality Date  . PEG PLACEMENT N/A 12/13/2020   Procedure: PERCUTANEOUS ENDOSCOPIC GASTROSTOMY (PEG) PLACEMENT;  Surgeon: Lesly Rubenstein, MD;  Location: [facility] ENDOSCOPY;  Service: Endoscopy;  Laterality: N/A;  . TRACHEOSTOMY TUBE PLACEMENT N/A 12/13/2020   Procedure: TRACHEOSTOMY;  Surgeon: Clyde Canterbury, MD;  Location: [facility] ORS;  Service: ENT;  Laterality: N/A;    There were no vitals filed for this visit.   Subjective Assessment - 03/07/21 1004    Subjective Pt reports no pain and denies falls or near falls. She reports quick onset of fatigue of R UE.    Patient is accompained by: Family member   Husband   Pertinent History Patient experienced a Cardiac arrest on 12/02/2020 with subsequent anoxic brain injury requiring intubation.    Limitations Lifting;Standing;Walking;Writing;House hold activities    How long can you sit comfortably? No issues    How long can you stand comfortably? Limited due to fatigue.    How long can you walk comfortably? Limited secondary to fatigue.    Patient Stated Goals Improve my balance and mobility back to  independent    Currently in Pain? No/denies           Patient experienced a Cardiac arrest on 12/02/2020 with subsequent anoxic brain injury requiring intubation.  Interventions: Therex:  Seated hip march 4lb. BLE 1x20, 1x15 reps; pt rates exercise "medium" difficulty   Seated Knee ext 4lb. BLE 2x15 reps; pt rates exercise "medium"  Standing LE strengthening at //bars: 2x12 reps for each of the following with VC and visual demo for correct technique.  All using 4lb AW BLE  Standing march  Standing hip abd  Standing hip ext  Standing calf raises  Seated hamstring stretch 2x30 sec BLEs; VC/Demo for technique   Neuromuscular re-ed: In //bars  Standing on airex pad WBOS and NBOS, followed by semi tandem stance with each LE as primary stance LE with reaching and stacking/unstacking cones with R UE x multiple reps for each. Pt reports fatigue of RUE, only slight decrease in postural stability with semi-tandem stance but no LOB.   Dynamic tapping on 4 colored cones, dual task with PT calling out which LE and which cone color to tap- Pt with some difficulty with dual task. X multiple rounds. Decreasing levels of UE support. Up to min assist x1 from PT for pt to maintain balance.   Education provided throughout session via VC/TC and demonstration to facilitate movement at target joints and correct muscle activation for all testing and exercises performed.  PT Education - 03/07/21 1059    Education Details body mechanics, exercise technique    Person(s) Educated Patient    Methods Explanation;Demonstration;Verbal cues;Tactile cues    Comprehension Verbalized understanding            PT Short Term Goals - 02/13/21 2239      PT SHORT TERM GOAL #1   Title Pt will be independent with HEP in order to improve strength and balance in order to decrease fall risk and improve function at home and work.    Baseline 02/13/2021- Patient has no formal HEP in place.    Time 6    Period  Weeks    Status New    Target Date 03/27/21             PT Long Term Goals - 02/13/21 2237      PT LONG TERM GOAL #1   Title Pt will improve FOTO to target score to 77 to display perceived improvements in ability to complete ADL's.    Baseline 02/13/2021= FOTO score-69    Time 12    Period Weeks    Status New    Target Date 05/08/21      PT LONG TERM GOAL #2   Title Pt will decrease 5TSTS by at least 3 seconds in order to demonstrate clinically significant improvement in LE strength.    Baseline 02/13/2021= 15.0 sec without UE support    Time 12    Period Weeks    Status New    Target Date 05/08/21      PT LONG TERM GOAL #3   Title Patient will increase RLE gross strength to 4+/5 as to improve functional strength for independent gait, increased standing tolerance and increased ADL ability.    Baseline 02/13/2021- Patient presents with 4/5 right Hip flex/knee flex/ext, ankle DF    Time 12    Period Weeks    Status New    Target Date 05/08/21      PT LONG TERM GOAL #4   Title Patient will be modified independent in walking on even/uneven surface with least restrictive assistive device, for 10+ minutes without rest break, reporting some difficulty or less to improve walking tolerance with community ambulation including grocery shopping, going to [place] 02/13/2021- Patient presents with limited ambulation around 120 feet with decreased coordination and fatigue.    Time 12    Period Weeks    Status New    Target Date 05/08/21      PT LONG TERM GOAL #5   Title Patient will increase Functional Gait Assessment score to >15 /30 as to reduce fall risk and improve dynamic gait safety with community ambulation.    Baseline 02/13/2021: FGA= 8/20    Time 12    Period Weeks    Status New    Target Date 05/08/21                 Plan - 03/07/21 1100    Clinical Impression Statement Continued with POC as indicated by previous author. Pt progressed in volume of  therex performed and rated majority of exercises as medium difficulty. She continues to be challenged with balance with cognitive dual task with using RUE, requiring up to min assist to maintain balance. Pt will benefit from further skilled PT to improve balance, and LE strength to increase safety with all functional mobility.    Personal Factors and Comorbidities Comorbidity 3+    Comorbidities Anoxic brain injury,  Sinus tachycardia, Dysphagia    Examination-Activity Limitations Caring for Others;Carry;Lift;Squat;Stairs    Examination-Participation Restrictions Cleaning;Community Activity;Driving;Laundry;Occupation;Personal Finances;Yard Work    Stability/Clinical Decision Making Stable/Uncomplicated    Rehab Potential Good    PT Frequency 2x / week    PT Duration 12 weeks    PT Treatment/Interventions ADLs/Self Care Home Management;Cryotherapy;Moist Heat;DME Instruction;Gait training;Stair training;Functional mobility training;Therapeutic activities;Therapeutic exercise;Balance training;Neuromuscular re-education;Cognitive remediation;Patient/family education    PT Next Visit Plan Initiate LE strengthening, Coordination/balance activities., continue POC as indicated previously    PT Home Exercise Plan Added standing LE strengthening HEP today and issued handout    Consulted and Agree with Plan of Care Patient;Family member/caregiver    Family Member Consulted Patient son           Patient will benefit from skilled therapeutic intervention in order to improve the following deficits and impairments:  Abnormal gait,Decreased activity tolerance,Decreased balance,Decreased cognition,Decreased coordination,Decreased endurance,Decreased mobility,Difficulty walking,Decreased strength  Visit Diagnosis: Muscle weakness (generalized)  Other abnormalities of gait and mobility  Unsteadiness on feet     Problem List Patient Active Problem List   Diagnosis Date Noted  . Dysarthria and anarthria  02/11/2021  . Sinus tachycardia   . Labile blood glucose   . Dysphagia   . Seizure prophylaxis   . Malnutrition of moderate degree 01/23/2021  . Anoxic brain injury ([place]) 01/10/2021  . Prediabetes   . Dyslipidemia   . S/P percutaneous endoscopic gastrostomy (PEG) tube placement ([place])   . Status post tracheostomy ([place])   . Aspiration pneumonia of both lower lobes due to gastric secretions ([place]) 01/05/2021  . Acute respiratory failure ([place])   . Hypoxic ischemic encephalopathy   . Endotracheal tube present   . Cardiac arrest ([place]) 12/02/2020  . Essential hypertension 12/09/2011   Ricard Dillon PT, DPT 03/07/2021, 11:03 AM  [place] MAIN [facility] SERVICES [address] [place], [place], [address] Phone: [phone]   Fax:  [phone]  Name: [name] [name] MRN: [id number] Date of Birth: Apr 11, 1966

## 2021-03-10 ENCOUNTER — Encounter: Payer: Self-pay | Admitting: Family

## 2021-03-11 ENCOUNTER — Encounter: Payer: Self-pay | Admitting: Physical Therapy

## 2021-03-11 ENCOUNTER — Encounter: Payer: Self-pay | Admitting: Occupational Therapy

## 2021-03-11 ENCOUNTER — Telehealth: Payer: Self-pay | Admitting: Adult Health Nurse Practitioner

## 2021-03-11 ENCOUNTER — Other Ambulatory Visit: Payer: Self-pay

## 2021-03-11 ENCOUNTER — Ambulatory Visit: Payer: Self-pay | Admitting: Occupational Therapy

## 2021-03-11 ENCOUNTER — Ambulatory Visit: Payer: Self-pay | Admitting: Physical Therapy

## 2021-03-11 ENCOUNTER — Ambulatory Visit: Payer: Self-pay | Admitting: Speech Pathology

## 2021-03-11 DIAGNOSIS — R2681 Unsteadiness on feet: Secondary | ICD-10-CM

## 2021-03-11 DIAGNOSIS — M6281 Muscle weakness (generalized): Secondary | ICD-10-CM

## 2021-03-11 DIAGNOSIS — G931 Anoxic brain damage, not elsewhere classified: Secondary | ICD-10-CM

## 2021-03-11 DIAGNOSIS — R278 Other lack of coordination: Secondary | ICD-10-CM

## 2021-03-11 DIAGNOSIS — R2689 Other abnormalities of gait and mobility: Secondary | ICD-10-CM

## 2021-03-11 DIAGNOSIS — R41841 Cognitive communication deficit: Secondary | ICD-10-CM

## 2021-03-11 NOTE — Telephone Encounter (Signed)
Rec'd return call from patient and after discussing the Palliative referral/services with her she was in agreement with scheduling visit with NP.  I have scheduled an In-home Consult for 03/18/21 @ 9 AM.

## 2021-03-11 NOTE — Therapy (Signed)
[place] MAIN [facility] SERVICES [address] [place], [place], [address] Phone: [phone]   Fax:  [phone]  Occupational Therapy Treatment  Patient Details  Name: [name] [name] MRN: [id number] Date of Birth: [birth year] No data recorded  Encounter Date: 03/11/2021   OT End of Session - 03/11/21 1532    Visit Number 5    Number of Visits 24    Date for OT Re-Evaluation 05/13/21    Authorization Type Progress reports period starting 02/11/2021    OT Start Time 1515    OT Stop Time 1600    OT Time Calculation (min) 45 min    Activity Tolerance Patient tolerated treatment well    Behavior During Therapy [facility] for tasks assessed/performed           Past Medical History:  Diagnosis Date  . Hypertension     Past Surgical History:  Procedure Laterality Date  . PEG PLACEMENT N/A 12/13/2020   Procedure: PERCUTANEOUS ENDOSCOPIC GASTROSTOMY (PEG) PLACEMENT;  Surgeon: Lesly Rubenstein, MD;  Location: [facility] ENDOSCOPY;  Service: Endoscopy;  Laterality: N/A;  . TRACHEOSTOMY TUBE PLACEMENT N/A 12/13/2020   Procedure: TRACHEOSTOMY;  Surgeon: Clyde Canterbury, MD;  Location: [facility] ORS;  Service: ENT;  Laterality: N/A;    There were no vitals filed for this visit.   Subjective Assessment - 03/11/21 1520    Subjective  Pt reports she has had a long day of therapies.    Patient is accompanied by: Family member    Pertinent History Pt. is a 55 y.o. female who went into cardiac arrest, and was admitted to [facility] on 12/02/2020. Pt. was diagnosed with Anoxic Brain Injury following the Cardiac Arrest. When medically stable, the pt. was tranferred to [facility] rehab. The patient was discharged home on 02/08/2021, and is ready to begin outpatient OT services. Pt. was independent, and working prior to the onset. Pt. Has very supportive family. Pt. Enjoys gardening.    Currently in Pain? No/denies             Self Care Pt completed basic money  management work sheet, accurately identified each coin and total amount of money displayed for pennies, nickels, and dimes however required MIN cues for quarters and half dollars. Pt wrote amounts with 25% legibility using good grips pencil grip and a mature hand grasp on the pen. Pt required cues for letter and line spacing. Pt required MOD cues for adding total amounts together using calculator. Pt completed calender scheduling task. Pt required MOD cues to number days of the month, required visual scanning and logical cues. Pt accurately placed appointments on appropriate dates given basic insstructions, MIN A for complex instructions (every other Tuesday, last weekend).             OT Education - 03/11/21 1527    Education Details HEP, money mgmt    Person(s) Educated Patient;Child(ren)    Methods Explanation;Demonstration    Comprehension Verbalized understanding;Returned demonstration               OT Long Term Goals - 02/11/21 1459      OT LONG TERM GOAL #1   Title Pt. will be able to independently sign her name with 100% legibility.    Baseline Eval: Signature not legible, however was able to sign on the line with no deviation above, or below the line.    Time 12    Period Weeks    Status New    Target  Date 05/13/21      OT LONG TERM GOAL #2   Title Pt. will increase right shoulder AROM to be able to independently retrieve items from closets, and cabinets.    Baseline Eval: Pt. presents with limited ROM.    Time 12    Period Weeks    Status New    Target Date 05/13/21      OT LONG TERM GOAL #3   Title Pt. will increase right grip strength by 5# to be able hold a full glass    Baseline Eval: limited grip strength. Limited bilateral digit flexion, unable to make a full composite fist.    Time 12    Period Weeks    Status New    Target Date 05/13/21      OT LONG TERM GOAL #4   Title Pt. will independently access a computer, and type a one sentence email  correspondence.    Baseline Eval: Pt. is unable to to perform    Time 12    Period Weeks    Status New    Target Date 05/13/21      OT LONG TERM GOAL #5   Title Pt. will improve bilateral [facility] skills to be able to grasp, and manipulate small objects during ADLs.    Baseline Eval: Pt. presents withlimite [facility] skills with difficulty picking,a nd manipulating small objects.    Time 12    Period Weeks    Status New    Target Date 05/13/21      OT LONG TERM GOAL #6   Title Pt. will improve UE strength by 2 mm grades to assist with ADLs, and IADLS,    Baseline Eval: pt. has difficulty performing.    Time 12    Period Weeks    Status New    Target Date 05/13/21      OT LONG TERM GOAL #7   Title Pt. will complete light meal preparation with minA    Baseline Eval: Pt. is unable to prepare light meal preparation.    Time 12    Period Weeks    Status New    Target Date 05/13/21      OT LONG TERM GOAL #8   Title Pt. will perform light home management tasks with minA    Baseline Eval: pt. is unable to perform    Time 12    Period Weeks    Status New    Target Date 05/13/21                 Plan - 03/11/21 1533    Clinical Impression Statement Pt completed money managment tasks and handwriting with scheduling activities on calendar. Pt rewuired MIN cues to complete simple money mgmt task using coins only. Pt completed handwriting with 25% legibility using jumbo good grips. Pt continues to work on improving RUE ROM, strength, and [place] skills in order to work towards improving RUE engagement during daily tasks, and maximizing independence with ADLs, and IADLs.    OT Occupational Profile and History Problem Focused Assessment - Including review of records relating to presenting problem    Occupational performance deficits (Please refer to evaluation for details): ADL's;IADL's    Body Structure / Function / Physical Skills ADL;IADL;FMC;ROM;Strength    Rehab Potential Good    Clinical  Decision Making Several treatment options, min-mod task modification necessary    Comorbidities Affecting Occupational Performance: May have comorbidities impacting occupational performance    Modification or Assistance to Complete  Evaluation  Min-Moderate modification of tasks or assist with assess necessary to complete eval    OT Frequency 2x / week    OT Duration 12 weeks    OT Treatment/Interventions Self-care/ADL training;Neuromuscular education;Therapeutic activities;Patient/family education;DME and/or AE instruction;Visual/perceptual remediation/compensation    Consulted and Agree with Plan of Care Patient           Patient will benefit from skilled therapeutic intervention in order to improve the following deficits and impairments:   Body Structure / Function / Physical Skills: ADL,IADL,FMC,ROM,Strength       Visit Diagnosis: Muscle weakness (generalized)  Anoxic brain injury ([place])  Other lack of coordination    Problem List Patient Active Problem List   Diagnosis Date Noted  . Dysarthria and anarthria 02/11/2021  . Sinus tachycardia   . Labile blood glucose   . Dysphagia   . Seizure prophylaxis   . Malnutrition of moderate degree 01/23/2021  . Anoxic brain injury ([place]) 01/10/2021  . Prediabetes   . Dyslipidemia   . S/P percutaneous endoscopic gastrostomy (PEG) tube placement ([place])   . Status post tracheostomy ([place])   . Aspiration pneumonia of both lower lobes due to gastric secretions ([place]) 01/05/2021  . Acute respiratory failure ([place])   . Hypoxic ischemic encephalopathy   . Endotracheal tube present   . Cardiac arrest ([place]) 12/02/2020  . Essential hypertension 12/09/2011    Dessie Coma, M.S. OTR/L  03/12/21, 9:12 AM  ascom [phone]  [place] MAIN [facility] SERVICES [address] [place], [place], [address] Phone: [phone]   Fax:  [phone]  Name: [name] [name] MRN: [id number] Date of Birth:  [birth year]

## 2021-03-11 NOTE — Telephone Encounter (Signed)
Called patient and left message regarding the Palliative referral and requested a return call from her or her husband to let me know if she wishes to pursue Palliative services or not, left my name and call back number.   I then called husband and spoke with him to see if he discussed Palliative services wtih patient and he said he did.  I told him I had left her a voicemail and requested that she call me back to let me know if she was interested in Palliative services or not.  Husband said that he would call her after we hung up and ask her to call me back and let me know.

## 2021-03-11 NOTE — Therapy (Signed)
[place] MAIN [facility] SERVICES [address] [place], [place], [address] Phone: [phone]   Fax:  [phone]  Physical Therapy Treatment  Patient Details  Name: [name] [name] MRN: [id number] Date of Birth: [birth year] Referring Provider (PT): D. [place]- PA-C   Encounter Date: 03/11/2021   PT End of Session - 03/11/21 1301    Visit Number 5    Number of Visits 25    Date for PT Re-Evaluation 05/08/21    Authorization Type 03/24/202- 05/08/2021    PT Start Time 1345    PT Stop Time 1430    PT Time Calculation (min) 45 min    Equipment Utilized During Treatment Gait belt    Activity Tolerance Patient tolerated treatment well    Behavior During Therapy WFL for tasks assessed/performed           Past Medical History:  Diagnosis Date  . Hypertension     Past Surgical History:  Procedure Laterality Date  . PEG PLACEMENT N/A 12/13/2020   Procedure: PERCUTANEOUS ENDOSCOPIC GASTROSTOMY (PEG) PLACEMENT;  Surgeon: Lesly Rubenstein, MD;  Location: [facility] ENDOSCOPY;  Service: Endoscopy;  Laterality: N/A;  . TRACHEOSTOMY TUBE PLACEMENT N/A 12/13/2020   Procedure: TRACHEOSTOMY;  Surgeon: Clyde Canterbury, MD;  Location: [facility] ORS;  Service: ENT;  Laterality: N/A;    There were no vitals filed for this visit.   Subjective Assessment - 03/11/21 1407    Subjective Patient reports doing well; no new complaints; no new falls;    Patient is accompained by: Family member   Husband   Pertinent History Patient experienced a Cardiac arrest on 12/02/2020 with subsequent anoxic brain injury requiring intubation.    Limitations Lifting;Standing;Walking;Writing;House hold activities    How long can you sit comfortably? No issues    How long can you stand comfortably? Limited due to fatigue.    How long can you walk comfortably? Limited secondary to fatigue.    Patient Stated Goals Improve my balance and mobility back to independent    Currently in Pain?  No/denies    Multiple Pain Sites No                    Interventions: Therex:  Standing LE strengthening at //bars:x20 reps for each of the following with VC and visual demo for correct technique.   All using 4lb AW BLE   Standing march   Standing hip abd   Standing hip ext   Standing calf raises Patient tolerated well; She does report mod. fatigue following standing exercise.   Patient complains of difficulty negotiating steps especially feeling as though she will fall when descending;   Assessed step negotiation, patient descends one step at a time with rail assist, slightly sideways;  Instructed patient in standing eccentric lower with 1 rail assist x8 reps each LE to challenge quad control and strengthening;     Neuromuscular re-ed: In //bars Standing on airex pad: -alternate toe taps to 8 inch step (2nd step) x15 reps with 1-0 rail assist -standing one foot on airex, one foot on 8 inch step, unsupported:  Ball pass side/side x5 reps each direction with min A for safety;  Forward reciprocal stepping over orange hurdle #2 x8 reps, progressing from looking down to looking ahead to stepping over while holding ball to challenge dynamic balance with reduced visual field. Patient required CGA to close supervision with good foot clearance over hurdle.   Side stepping over hurdle #2 x5 reps each direction;  Patient exhibits increased difficulty stepping to right requiring cues to keep foot oriented forward and avoid compensation with less hip ER;     Ladder drills: Forward reciprocal gait x2 laps Forward step with contralateral leg lift (SLS) 3 sec hold x2 laps, min A for safety and cues to improve core stabilization for better stance control Out-out-in-in x2 laps with mod VCs for sequencing and positioning;  Side stepping with ball toss/catch x1 lap each direction; Patient had difficulty with dual task and required cues for orienting foot forward for better side step  negotiation;    Clinical Impression: Patient tolerated session well. She does report moderate fatigue with standing exercise. She continues to require cues for proper positioning for optimal muscle activation/strengthening. Patient instructed in advanced balance activities to challenge coordination and stability. She does exhibit increased difficulty negotiating obstacles especially when side stepping. She also had some difficulty with dual task. Patient required min A to close supervision for safety with all balance activities. She would benefit from additional skilled PT intervention to improve strength, balance and mobility;    Education provided throughout session via VC/TC and demonstration to facilitate movement at target joints and correct muscle activation for all testing and exercises performed.                   PT Education - 03/11/21 1301    Education Details strengthening/balance;    Person(s) Educated Patient    Methods Explanation;Verbal cues    Comprehension Verbalized understanding;Returned demonstration;Verbal cues required;Need further instruction            PT Short Term Goals - 02/13/21 2239      PT SHORT TERM GOAL #1   Title Pt will be independent with HEP in order to improve strength and balance in order to decrease fall risk and improve function at home and work.    Baseline 02/13/2021- Patient has no formal HEP in place.    Time 6    Period Weeks    Status New    Target Date 03/27/21             PT Long Term Goals - 02/13/21 2237      PT LONG TERM GOAL #1   Title Pt will improve FOTO to target score to 77 to display perceived improvements in ability to complete ADL's.    Baseline 02/13/2021= FOTO score-69    Time 12    Period Weeks    Status New    Target Date 05/08/21      PT LONG TERM GOAL #2   Title Pt will decrease 5TSTS by at least 3 seconds in order to demonstrate clinically significant improvement in LE strength.    Baseline  02/13/2021= 15.0 sec without UE support    Time 12    Period Weeks    Status New    Target Date 05/08/21      PT LONG TERM GOAL #3   Title Patient will increase RLE gross strength to 4+/5 as to improve functional strength for independent gait, increased standing tolerance and increased ADL ability.    Baseline 02/13/2021- Patient presents with 4/5 right Hip flex/knee flex/ext, ankle DF    Time 12    Period Weeks    Status New    Target Date 05/08/21      PT LONG TERM GOAL #4   Title Patient will be modified independent in walking on even/uneven surface with least restrictive assistive device, for 10+ minutes without rest break,  reporting some difficulty or less to improve walking tolerance with community ambulation including grocery shopping, going to [place] 02/13/2021- Patient presents with limited ambulation around 120 feet with decreased coordination and fatigue.    Time 12    Period Weeks    Status New    Target Date 05/08/21      PT LONG TERM GOAL #5   Title Patient will increase Functional Gait Assessment score to >15 /30 as to reduce fall risk and improve dynamic gait safety with community ambulation.    Baseline 02/13/2021: FGA= 8/20    Time 12    Period Weeks    Status New    Target Date 05/08/21                 Plan - 03/11/21 1504    Clinical Impression Statement Patient tolerated session well. She was instructed in advanced LE strengthening exercise, progressed standing exercise with increased repetition. Provided written HEP for better adherence; Patient also instructed in advanced balance exercise. Challenged patient with dual task and obstacle negotiation. She does exhibit increased difficulty when stepping over obstacles sideways often compensating with increased trunk/hip rotation due to weakness with abduction. She would benefit from additional skilled PT intervention to improve strength, balance and mobility;    Personal Factors and  Comorbidities Comorbidity 3+    Comorbidities Anoxic brain injury, Sinus tachycardia, Dysphagia    Examination-Activity Limitations Caring for Others;Carry;Lift;Squat;Stairs    Examination-Participation Restrictions Cleaning;Community Activity;Driving;Laundry;Occupation;Personal Finances;Yard Work    Stability/Clinical Decision Making Stable/Uncomplicated    Rehab Potential Good    PT Frequency 2x / week    PT Duration 12 weeks    PT Treatment/Interventions ADLs/Self Care Home Management;Cryotherapy;Moist Heat;DME Instruction;Gait training;Stair training;Functional mobility training;Therapeutic activities;Therapeutic exercise;Balance training;Neuromuscular re-education;Cognitive remediation;Patient/family education    PT Next Visit Plan Initiate LE strengthening, Coordination/balance activities., continue POC as indicated previously    PT Home Exercise Plan Added standing LE strengthening HEP today and issued handout    Consulted and Agree with Plan of Care Patient;Family member/caregiver    Family Member Consulted Patient son           Patient will benefit from skilled therapeutic intervention in order to improve the following deficits and impairments:  Abnormal gait,Decreased activity tolerance,Decreased balance,Decreased cognition,Decreased coordination,Decreased endurance,Decreased mobility,Difficulty walking,Decreased strength  Visit Diagnosis: Muscle weakness (generalized)  Other abnormalities of gait and mobility  Unsteadiness on feet     Problem List Patient Active Problem List   Diagnosis Date Noted  . Dysarthria and anarthria 02/11/2021  . Sinus tachycardia   . Labile blood glucose   . Dysphagia   . Seizure prophylaxis   . Malnutrition of moderate degree 01/23/2021  . Anoxic brain injury ([place]) 01/10/2021  . Prediabetes   . Dyslipidemia   . S/P percutaneous endoscopic gastrostomy (PEG) tube placement ([place])   . Status post tracheostomy ([place])   . Aspiration  pneumonia of both lower lobes due to gastric secretions ([place]) 01/05/2021  . Acute respiratory failure ([place])   . Hypoxic ischemic encephalopathy   . Endotracheal tube present   . Cardiac arrest ([place]) 12/02/2020  . Essential hypertension 12/09/2011    Shahil Speegle PT, DPT 03/11/2021, 3:06 PM  [place] MAIN [facility] SERVICES [address] [place], [place], [address] Phone: [phone]   Fax:  [phone]  Name: SOL ODOR MRN: [id number] Date of Birth: [birth year]

## 2021-03-13 ENCOUNTER — Other Ambulatory Visit: Payer: Self-pay

## 2021-03-13 ENCOUNTER — Ambulatory Visit: Payer: Self-pay | Admitting: Speech Pathology

## 2021-03-13 ENCOUNTER — Encounter: Payer: Self-pay | Admitting: Physical Therapy

## 2021-03-13 ENCOUNTER — Ambulatory Visit: Payer: Self-pay | Admitting: Occupational Therapy

## 2021-03-13 ENCOUNTER — Encounter: Payer: Self-pay | Admitting: Occupational Therapy

## 2021-03-13 ENCOUNTER — Ambulatory Visit: Payer: Self-pay | Admitting: Physical Therapy

## 2021-03-13 DIAGNOSIS — R2689 Other abnormalities of gait and mobility: Secondary | ICD-10-CM

## 2021-03-13 DIAGNOSIS — R278 Other lack of coordination: Secondary | ICD-10-CM

## 2021-03-13 DIAGNOSIS — M6281 Muscle weakness (generalized): Secondary | ICD-10-CM

## 2021-03-13 DIAGNOSIS — G931 Anoxic brain damage, not elsewhere classified: Secondary | ICD-10-CM

## 2021-03-13 DIAGNOSIS — R2681 Unsteadiness on feet: Secondary | ICD-10-CM

## 2021-03-13 NOTE — Therapy (Signed)
[place] MAIN [facility] SERVICES [address] [place], [place], [address] Phone: [phone]   Fax:  [phone]  Speech Language Pathology Treatment  Patient Details  Name: [name] [name] MRN: [id number] Date of Birth: October 08, 1966 Referring Provider (SLP): [place]e [place], [place]   Encounter Date: 03/13/2021   End of Session - 03/13/21 1618    Visit Number 8    Number of Visits 25    Date for SLP Re-Evaluation 05/06/21    Authorization Type Medicaid    Authorization Time Period 02/11/2021 thru 05/06/2021    Authorization - Visit Number 8    Progress Note Due on Visit 10    SLP Start Time 1500    SLP Stop Time  1600    SLP Time Calculation (min) 60 min    Activity Tolerance Patient tolerated treatment well           Past Medical History:  Diagnosis Date  . Hypertension     Past Surgical History:  Procedure Laterality Date  . PEG PLACEMENT N/A 12/13/2020   Procedure: PERCUTANEOUS ENDOSCOPIC GASTROSTOMY (PEG) PLACEMENT;  Surgeon: Lesly Rubenstein, MD;  Location: [facility] ENDOSCOPY;  Service: Endoscopy;  Laterality: N/A;  . TRACHEOSTOMY TUBE PLACEMENT N/A 12/13/2020   Procedure: TRACHEOSTOMY;  Surgeon: Clyde Canterbury, MD;  Location: [facility] ORS;  Service: ENT;  Laterality: N/A;    There were no vitals filed for this visit.   Subjective Assessment - 03/13/21 1617    Subjective pt pleasant, engaged    Patient is accompained by: Family member    Currently in Pain? No/denies              Neuro   ST   TX   NOTE          Treatment Data and Patient's Response to Treatment    Skilled ST intervention targeted pt's cognitive communication goals. SLP facilitated session by providing pt with worksheet (pgs 89-93 from [facility] 11) in which she was required to answer questions based on a picture. Pt required maximal faded to moderate verbal cues, including phonemic cues, to answer questions. When presented with questions regarding location of items (ex:  above, next to), pt required maximal assistance. Pt has shown progress during today's session by independently formulating sentences based on the question asked. For example, when asked "how many stories does the house have?" pt responded accurately with "the house has two stories". SLP then provided pt with sequence pictures and written sentences corresponding to the action represented on the pictures. Audio recording was used as feedback to assist in developing pt's awareness of speech quality and loudness. When reading each sentence aloud, pt consistently achieved a loudness level of 83-84 dB. Pt would at times rush through sentences, requiring minimal verbal cues to slow down. Upon slowing down, pt's speech intelligibility increased. As such, skilled ST continues to be required to increase pt's functional independence and reduce caregiver burden.      SLP Education - 03/13/21 1617    Education Details provided    Person(s) Educated Patient;Spouse    Methods Explanation;Demonstration;Verbal cues;Handout    Comprehension Verbalized understanding;Verbal cues required;Need further instruction            SLP Short Term Goals - 02/11/21 1156      SLP SHORT TERM GOAL #1   Title With moderate cues, pt will demonstrate selective attention in mildly distracting environment for 15 minutes.    Baseline sustained attention    Time 10  Period --   sessions   Status New      SLP SHORT TERM GOAL #2   Title With minimal cues, pt will utilize speech intelligibility strategies to achieve > 90% intelligibility at the phrase level.    Baseline moderate assistance to achieve ~ 50% intelligibility at phrase level    Time 10    Period --   sessions   Status New      SLP SHORT TERM GOAL #3   Title With minimal cues, pt will utilize external memory aids for recall of family members' names and ages with 95% accuracy.    Baseline 50% accurate    Time 10    Period --   sessions   Status New      SLP  SHORT TERM GOAL #4   Title With minimal assistance, pt will demonstrate functional problem solving for basic and familiar tasks to achieve >95% accuracy.    Baseline moderate cues    Time 10    Period --   sessions   Status New      SLP SHORT TERM GOAL #5   Title Pt will consume current diet without any reports s/s of dysphagia by pt or family members.    Baseline upgraded to dysphagia 3 diet on 02/11/2021    Time 10    Period --   session   Status New            SLP Long Term Goals - 02/11/21 1202      SLP LONG TERM GOAL #1   Title Pt will consume safest, least restrictive diet without overt s/s of dysphagia, aspiration or respiratory distress.    Baseline upgraded to dysphagia 3 diet on 02/11/2021    Time 12    Period Weeks    Status New    Target Date 05/06/21      SLP LONG TERM GOAL #2   Title Pt will independently complete functional problem solving for basic and familiar tasks.    Baseline moderate assistance    Time 12    Period Weeks    Status New    Target Date 05/06/21      SLP LONG TERM GOAL #3   Title Pt will demonstrate selective attention in a moderately distracting environment for 15 minutes with < 1 redirection to task.    Baseline sustained attention    Time 12    Period Weeks    Status New    Target Date 05/06/21      SLP LONG TERM GOAL #4   Title With minimal cues, pt will increase speech intelligibility to > 75% at the sentence level.    Baseline 50% at the phrase level    Time 12    Period Weeks    Status New    Target Date 05/06/21            Plan - 03/13/21 1619    Clinical Impression Statement Skilled ST intervention targeted pt's cognitive communication goals. SLP facilitated session by providing pt with worksheet (pgs 89-93 from [facility] 11) in which she was required to answer questions based on a picture. Pt required maximal faded to moderate verbal cues, including phonemic cues, to answer questions. When presented with questions regarding  location of items (ex: above, next to), pt required maximal assistance. Pt has shown progress during today's session by independently formulating sentences based on the question asked. For example, when asked "how many stories does the house have?" pt  responded accurately with "the house has two stories". SLP then provided pt with sequence pictures and written sentences corresponding to the action represented on the pictures. Audio recording was used as feedback to assist in developing pt's awareness of speech quality and loudness. When reading each sentence aloud, pt consistently achieved a loudness level of 83-84 dB. Pt would at times rush through sentences, requiring minimal verbal cues to slow down. Upon slowing down, pt's speech intelligibility increased. As such, skilled ST continues to be required to increase pt's functional independence and reduce caregiver burden.    Speech Therapy Frequency 2x / week    Duration 12 weeks    Treatment/Interventions Diet toleration management by SLP;Cognitive reorganization;Compensatory techniques;Internal/external aids;SLP instruction and feedback;Patient/family education;Functional tasks;Compensatory strategies    Potential to Achieve Goals Good    Potential Considerations Severity of impairments    Consulted and Agree with Plan of Care Patient;Family member/caregiver    Family Member Consulted pt's husband           Patient will benefit from skilled therapeutic intervention in order to improve the following deficits and impairments:   Anoxic brain injury [facility])    Problem List Patient Active Problem List   Diagnosis Date Noted  . Dysarthria and anarthria 02/11/2021  . Sinus tachycardia   . Labile blood glucose   . Dysphagia   . Seizure prophylaxis   . Malnutrition of moderate degree 01/23/2021  . Anoxic brain injury ([place]) 01/10/2021  . Prediabetes   . Dyslipidemia   . S/P percutaneous endoscopic gastrostomy (PEG) tube placement ([place])   .  Status post tracheostomy ([place])   . Aspiration pneumonia of both lower lobes due to gastric secretions ([place]) 01/05/2021  . Acute respiratory failure ([place])   . Hypoxic ischemic encephalopathy   . Endotracheal tube present   . Cardiac arrest ([place]) 12/02/2020  . Essential hypertension 12/09/2011   Liliyana Thobe B. Rutherford Nail M.S., CCC-SLP, [place] Pathologist Rehabilitation Services Office [phone]  Stormy Fabian 03/13/2021, 4:37 PM  [place] MAIN [facility] SERVICES [address] [place], [place], [address] Phone: [phone]   Fax:  [phone]   Name: KATIEANN HUNGATE MRN: [id number] Date of Birth: [birth year]

## 2021-03-13 NOTE — Therapy (Signed)
[place] [facility] SERVICES [address] [place], [place], [address] Phone: [phone]   Fax:  [phone]  Physical Therapy Treatment  Patient Details  Name: [name] [name] MRN: [id number] Date of Birth: 06/28/1966 Referring Provider (PT): D. [place]- PA-C   Encounter Date: 03/13/2021   PT End of Session - 03/13/21 1330    Visit Number 6    Number of Visits 25    Date for PT Re-Evaluation 05/08/21    Authorization Type 03/24/202- 05/08/2021    PT Start Time 1401    PT Stop Time 1442    PT Time Calculation (min) 41 min    Equipment Utilized During Treatment Gait belt    Activity Tolerance Patient tolerated treatment well    Behavior During Therapy WFL for tasks assessed/performed           Past Medical History:  Diagnosis Date  . Hypertension     Past Surgical History:  Procedure Laterality Date  . PEG PLACEMENT N/A 12/13/2020   Procedure: PERCUTANEOUS ENDOSCOPIC GASTROSTOMY (PEG) PLACEMENT;  Surgeon: Lesly Rubenstein, MD;  Location: [facility] ENDOSCOPY;  Service: Endoscopy;  Laterality: N/A;  . TRACHEOSTOMY TUBE PLACEMENT N/A 12/13/2020   Procedure: TRACHEOSTOMY;  Surgeon: Clyde Canterbury, MD;  Location: [facility] ORS;  Service: ENT;  Laterality: N/A;    There were no vitals filed for this visit.   Subjective Assessment - 03/13/21 1401    Subjective Patient reports doing well; no new complaints; no new falls;    Patient is accompained by: Family member   Husband   Pertinent History Patient experienced a Cardiac arrest on 12/02/2020 with subsequent anoxic brain injury requiring intubation.    Limitations Lifting;Standing;Walking;Writing;House hold activities    How long can you sit comfortably? No issues    How long can you stand comfortably? Limited due to fatigue.    How long can you walk comfortably? Limited secondary to fatigue.    Patient Stated Goals Improve my balance and mobility back to independent    Currently in Pain?  No/denies    Multiple Pain Sites No           Interventions: Therex:  Standing LE strengthening at //bars:x15 reps for each of the following with VC and visual demo for correct technique.   All using green tband around BLE   Standing hip abd   Standing hip ext   Side stepping unsupported x12 feet x 2 reps each direction; progressed from short quick steps to longer steps with better motor control;  Patient tolerated well; She does report mod. fatigue following standing exercise.     Seated: Piriformis stretch 30 sec hold x1 rep each Hamstring stretch 30 sec hold x1 rep each; Required min VCs for proper positioning;   Neuromuscular re-ed: Beside steps/railing;  Standing on airex pad: -alternate toe taps to 8 inch step (2nd step) x15 reps with 1-0 rail assist -standing one foot on airex, one foot on 8 inch step, unsupported:             Ball pass side/side x10 reps each direction with min A for safety;  Forward step up on trampoline with 1 rail assist x5 reps each LE with good control and positioning  Standing on trampoline: -alternate march x15 reps no rail assist with cues for increased hip flexion for better challenge to motor control;  -mini jump landing in squat position with BUE HHA x10 reps;   Side stepping over orange hurdle #1 with 1-0  rail assist x10 reps each direction  Diagonal stepping over 2x4 beam forward x3 laps, backward x1 lap Required CGA for safety and mod VCs for sequencing. Patient had increased difficulty with backwards stepping and also difficulty with sequencing often crossing feet over;     Gait in hallway: -side stepping with ball toss and catch against wall x20 feet x2 laps each direction -forward/backward walking with small toss/catch x50 feet -forward/backward walking with small ball bounce and catch x50 feet; Patient able to complete with supervision exhibiting good safety awareness and better step with dual task;    Clinical Impression:  Patient tolerated session well. She does report moderate fatigue with standing exercise. She continues to require cues for proper positioning for optimal muscle activation/strengthening. Patient instructed in advanced balance activities to challenge coordination and stability. She does exhibit increased difficulty negotiating obstacles especially when side stepping. She also had some difficulty with dual task. Patient required min A to close supervision for safety with all balance activities. She would benefit from additional skilled PT intervention to improve strength, balance and mobility;    Education provided throughout session via VC/TC and demonstration to facilitate movement at target joints and correct muscle activation for all testing and exercises performed.                            PT Education - 03/13/21 1330    Education Details strengthening/balance, HEP    Person(s) Educated Patient    Methods Explanation;Verbal cues    Comprehension Verbalized understanding;Returned demonstration;Verbal cues required;Need further instruction            PT Short Term Goals - 02/13/21 2239      PT SHORT TERM GOAL #1   Title Pt will be independent with HEP in order to improve strength and balance in order to decrease fall risk and improve function at home and work.    Baseline 02/13/2021- Patient has no formal HEP in place.    Time 6    Period Weeks    Status New    Target Date 03/27/21             PT Long Term Goals - 02/13/21 2237      PT LONG TERM GOAL #1   Title Pt will improve FOTO to target score to 77 to display perceived improvements in ability to complete ADL's.    Baseline 02/13/2021= FOTO score-69    Time 12    Period Weeks    Status New    Target Date 05/08/21      PT LONG TERM GOAL #2   Title Pt will decrease 5TSTS by at least 3 seconds in order to demonstrate clinically significant improvement in LE strength.    Baseline 02/13/2021= 15.0 sec  without UE support    Time 12    Period Weeks    Status New    Target Date 05/08/21      PT LONG TERM GOAL #3   Title Patient will increase RLE gross strength to 4+/5 as to improve functional strength for independent gait, increased standing tolerance and increased ADL ability.    Baseline 02/13/2021- Patient presents with 4/5 right Hip flex/knee flex/ext, ankle DF    Time 12    Period Weeks    Status New    Target Date 05/08/21      PT LONG TERM GOAL #4   Title Patient will be modified independent in walking on even/uneven  surface with least restrictive assistive device, for 10+ minutes without rest break, reporting some difficulty or less to improve walking tolerance with community ambulation including grocery shopping, going to [place] 02/13/2021- Patient presents with limited ambulation around 120 feet with decreased coordination and fatigue.    Time 12    Period Weeks    Status New    Target Date 05/08/21      PT LONG TERM GOAL #5   Title Patient will increase Functional Gait Assessment score to >15 /30 as to reduce fall risk and improve dynamic gait safety with community ambulation.    Baseline 02/13/2021: FGA= 8/20    Time 12    Period Weeks    Status New    Target Date 05/08/21                 Plan - 03/13/21 1442    Clinical Impression Statement Patient motivated and tolerated session well. She was instructed in advanced LE strengthening exercise. patient able to exhibit better motor control with tband compared to resisted ankle weights. Patient also instructed in LE stretches to help reduce delayed onset muscle soreness; Progressed balance exercise with higher level dual tasks. patient continues to required min-mod VCs with dual task especially with new activities. She requires min A to close supervision with all balance tasks. She was able to exhibit better side stepping ability this session. She would benefit from additional skilled PT intervention to  improve strength, balance and mobility;    Personal Factors and Comorbidities Comorbidity 3+    Comorbidities Anoxic brain injury, Sinus tachycardia, Dysphagia    Examination-Activity Limitations Caring for Others;Carry;Lift;Squat;Stairs    Examination-Participation Restrictions Cleaning;Community Activity;Driving;Laundry;Occupation;Personal Finances;Yard Work    Stability/Clinical Decision Making Stable/Uncomplicated    Rehab Potential Good    PT Frequency 2x / week    PT Duration 12 weeks    PT Treatment/Interventions ADLs/Self Care Home Management;Cryotherapy;Moist Heat;DME Instruction;Gait training;Stair training;Functional mobility training;Therapeutic activities;Therapeutic exercise;Balance training;Neuromuscular re-education;Cognitive remediation;Patient/family education    PT Next Visit Plan Initiate LE strengthening, Coordination/balance activities., continue POC as indicated previously    PT Home Exercise Plan Added standing LE strengthening HEP today and issued handout    Consulted and Agree with Plan of Care Patient;Family member/caregiver    Family Member Consulted Patient son           Patient will benefit from skilled therapeutic intervention in order to improve the following deficits and impairments:  Abnormal gait,Decreased activity tolerance,Decreased balance,Decreased cognition,Decreased coordination,Decreased endurance,Decreased mobility,Difficulty walking,Decreased strength  Visit Diagnosis: Muscle weakness (generalized)  Other lack of coordination  Other abnormalities of gait and mobility  Unsteadiness on feet     Problem List Patient Active Problem List   Diagnosis Date Noted  . Dysarthria and anarthria 02/11/2021  . Sinus tachycardia   . Labile blood glucose   . Dysphagia   . Seizure prophylaxis   . Malnutrition of moderate degree 01/23/2021  . Anoxic brain injury ([place]) 01/10/2021  . Prediabetes   . Dyslipidemia   . S/P percutaneous endoscopic  gastrostomy (PEG) tube placement ([place])   . Status post tracheostomy ([place])   . Aspiration pneumonia of both lower lobes due to gastric secretions ([place]) 01/05/2021  . Acute respiratory failure ([place])   . Hypoxic ischemic encephalopathy   . Endotracheal tube present   . Cardiac arrest ([place]) 12/02/2020  . Essential hypertension 12/09/2011    Porcha Deblanc PT, DPT 03/13/2021, 2:48 PM  [place] [facility]  SERVICES [place], [place], [address] Phone: [phone]   Fax:  [phone]  Name: ZEANNA SUNDE MRN: [id number] Date of Birth: 08/11/1966

## 2021-03-13 NOTE — Therapy (Signed)
[place] [facility] SERVICES [address] [place], [place], [address] Phone: [phone]   Fax:  [phone]  Occupational Therapy Treatment  Patient Details  Name: [name] [name] MRN: [id number] Date of Birth: [birth year] No data recorded  Encounter Date: 03/13/2021   OT End of Session - 03/13/21 1457    Visit Number 6    Number of Visits 24    Date for OT Re-Evaluation 05/13/21    Authorization Type Progress reports period starting 02/11/2021    OT Start Time 1300    OT Stop Time 1400    OT Time Calculation (min) 60 min    Activity Tolerance Patient tolerated treatment well    Behavior During Therapy [facility] for tasks assessed/performed           Past Medical History:  Diagnosis Date  . Hypertension     Past Surgical History:  Procedure Laterality Date  . PEG PLACEMENT N/A 12/13/2020   Procedure: PERCUTANEOUS ENDOSCOPIC GASTROSTOMY (PEG) PLACEMENT;  Surgeon: Lesly Rubenstein, MD;  Location: [facility] ENDOSCOPY;  Service: Endoscopy;  Laterality: N/A;  . TRACHEOSTOMY TUBE PLACEMENT N/A 12/13/2020   Procedure: TRACHEOSTOMY;  Surgeon: Clyde Canterbury, MD;  Location: [facility] ORS;  Service: ENT;  Laterality: N/A;    There were no vitals filed for this visit.   Subjective Assessment - 03/13/21 1455    Subjective  Pt. reports doing well today.    Patient is accompanied by: Family member    Pertinent History Pt. is a 55 y.o. female who went into cardiac arrest, and was admitted to [facility] on 12/02/2020. Pt. was diagnosed with Anoxic Brain Injury following the Cardiac Arrest. When medically stable, the pt. was tranferred to [facility] re[facility]. The patient was discharged home on 02/08/2021, and is ready to begin outpatient OT services. Pt. was independent, and working prior to the onset. Pt. Has very supportive family. Pt. Enjoys gardening.    Currently in Pain? No/denies          OT TREATMENT    Therapeutic Ex.  Pt. worked on using her RUE  for grasping large flat shapes, and moving them through varying heights of vertical dowels with a 2# cuff weight in place.   Selfcare:  Pt. worked of functional reaching with her right RUE retrieving clothing hangers from an elevated horizontal rod in a closet. Pt. worked on using her RUE to reach up into [employer], and retrieve glassware, and dishes. Pt. worked on using her bilateral UEs to reach up, retrieve, and place casserole dishes, and larger pots. Pt. worked on transporting items through [employer], and accessing appliances. Pt. worked on writing her name in printed, and cursive form using a standard pen. Pt. presented with positive deviation below the line. Pt. worked on typing a blank simulated email correspondence by copying the text from a sheet of paper. Pt. orchestrated 2 out of 4 of the sentences.   Pt. is making progress with RUE ROM, and functional reaching into closets, and cabinetry. Pt. was able to retrieve, and place glasses, and dishes onto higher shelves. Pt. was able to reach up to place larger pots, and items onto the first, and second shelves.  Pt. was able to write her name with 50% legibility, and positive deviation below the line. Pt. Required increased cues initially to type sentences on a laptop computer, requiring fewer cues as the task progressed. Pt. Continues to require additional work on navigating a computer, and typing in preparation  for sending email correspondence.                          OT Education - 03/13/21 1456    Education Details IADLs    Person(s) Educated Patient;Child(ren)    Methods Explanation;Demonstration    Comprehension Verbalized understanding;Returned demonstration               OT Long Term Goals - 02/11/21 1459      OT LONG TERM GOAL #1   Title Pt. will be able to independently sign her name with 100% legibility.    Baseline Eval: Signature not legible, however was able to sign on the line with no  deviation above, or below the line.    Time 12    Period Weeks    Status New    Target Date 05/13/21      OT LONG TERM GOAL #2   Title Pt. will increase right shoulder AROM to be able to independently retrieve items from closets, and cabinets.    Baseline Eval: Pt. presents with limited ROM.    Time 12    Period Weeks    Status New    Target Date 05/13/21      OT LONG TERM GOAL #3   Title Pt. will increase right grip strength by 5# to be able hold a full glass    Baseline Eval: limited grip strength. Limited bilateral digit flexion, unable to make a full composite fist.    Time 12    Period Weeks    Status New    Target Date 05/13/21      OT LONG TERM GOAL #4   Title Pt. will independently access a computer, and type a one sentence email correspondence.    Baseline Eval: Pt. is unable to to perform    Time 12    Period Weeks    Status New    Target Date 05/13/21      OT LONG TERM GOAL #5   Title Pt. will improve bilateral [facility] skills to be able to grasp, and manipulate small objects during ADLs.    Baseline Eval: Pt. presents [facility] skills with difficulty picking,a nd manipulating small objects.    Time 12    Period Weeks    Status New    Target Date 05/13/21      OT LONG TERM GOAL #6   Title Pt. will improve UE strength by 2 mm grades to assist with ADLs, and IADLS,    Baseline Eval: pt. has difficulty performing.    Time 12    Period Weeks    Status New    Target Date 05/13/21      OT LONG TERM GOAL #7   Title Pt. will complete light meal preparation with minA    Baseline Eval: Pt. is unable to prepare light meal preparation.    Time 12    Period Weeks    Status New    Target Date 05/13/21      OT LONG TERM GOAL #8   Title Pt. will perform light home management tasks with minA    Baseline Eval: pt. is unable to perform    Time 12    Period Weeks    Status New    Target Date 05/13/21                 Plan - 03/13/21 1457    Clinical  Impression Statement Pt. is  making progress with RUE ROM, and functional reaching into closets, and cabinetry. Pt. was able to retrieve, and place glasses, and dishes onto higher shelves. Pt. was able to reach up to place larger pots, and items onto the first, and second shelves.  Pt. was able to write her name with 50% legibility, and positive deviation below the line. Pt. Required increased cues initially to type sentences on a laptop computer, requiring fewer cues as the task progressed. Pt. Continues to require additional work on navigating a computer, and typing in preparation for sending email correspondence.   OT Occupational Profile and History Problem Focused Assessment - Including review of records relating to presenting problem    Occupational performance deficits (Please refer to evaluation for details): ADL's;IADL's    Body Structure / Function / Physical Skills ADL;IADL;FMC;ROM;Strength    Rehab Potential Good    Clinical Decision Making Several treatment options, min-mod task modification necessary    Comorbidities Affecting Occupational Performance: May have comorbidities impacting occupational performance    Modification or Assistance to Complete Evaluation  Min-Moderate modification of tasks or assist with assess necessary to complete eval    OT Frequency 2x / week    OT Duration 12 weeks    OT Treatment/Interventions Self-care/ADL training;Neuromuscular education;Therapeutic activities;Patient/family education;DME and/or AE instruction;Visual/perceptual remediation/compensation    Consulted and Agree with Plan of Care Patient           Patient will benefit from skilled therapeutic intervention in order to improve the following deficits and impairments:   Body Structure / Function / Physical Skills: ADL,IADL,FMC,ROM,Strength       Visit Diagnosis: Muscle weakness (generalized)  Other lack of coordination    Problem List Patient Active Problem List   Diagnosis Date  Noted  . Dysarthria and anarthria 02/11/2021  . Sinus tachycardia   . Labile blood glucose   . Dysphagia   . Seizure prophylaxis   . Malnutrition of moderate degree 01/23/2021  . Anoxic brain injury ([place]) 01/10/2021  . Prediabetes   . Dyslipidemia   . S/P percutaneous endoscopic gastrostomy (PEG) tube placement ([place])   . Status post tracheostomy ([place])   . Aspiration pneumonia of both lower lobes due to gastric secretions ([place]) 01/05/2021  . Acute respiratory failure ([place])   . Hypoxic ischemic encephalopathy   . Endotracheal tube present   . Cardiac arrest ([place]) 12/02/2020  . Essential hypertension 12/09/2011    Harrel Carina, MS, OTR/L 03/13/2021, 3:00 PM  [place] [facility] SERVICES [address] [place], [place], [address] Phone: [phone]   Fax:  [phone]  Name: [name] [name] MRN: [id number] Date of Birth: [birth year]

## 2021-03-13 NOTE — Therapy (Addendum)
[place] MAIN [facility] SERVICES [address] [place], [place], [address] Phone: [phone]   Fax:  [phone]  Speech Language Pathology Treatment  Patient Details  Name: MARCEY PERSAD MRN: [id number] Date of Birth: [birth year] Referring Provider (SLP): [place], [place]   Encounter Date: 03/11/2021   End of Session - 03/13/21 0744    Visit Number 7    Number of Visits 25    Date for SLP Re-Evaluation 05/06/21    Authorization Type Medicaid    Authorization Time Period 02/11/2021 thru 05/06/2021    Authorization - Visit Number 7    Progress Note Due on Visit 10    SLP Start Time 1300    SLP Stop Time  1400    SLP Time Calculation (min) 60 min    Activity Tolerance Patient tolerated treatment well           Past Medical History:  Diagnosis Date  . Hypertension     Past Surgical History:  Procedure Laterality Date  . PEG PLACEMENT N/A 12/13/2020   Procedure: PERCUTANEOUS ENDOSCOPIC GASTROSTOMY (PEG) PLACEMENT;  Surgeon: Lesly Rubenstein, MD;  Location: [facility] ENDOSCOPY;  Service: Endoscopy;  Laterality: N/A;  . TRACHEOSTOMY TUBE PLACEMENT N/A 12/13/2020   Procedure: TRACHEOSTOMY;  Surgeon: Clyde Canterbury, MD;  Location: [facility] ORS;  Service: ENT;  Laterality: N/A;    There were no vitals filed for this visit.    Neuro   ST   TX   NOTE          Treatment Data and Patient's Response to Treatment      Skilled treatment session focused on pt's cognitive communication goals. SLP faciitated session by providing supervision cues to physically sequence pictures in correct order, maximal faded to moderate cues to generate descriptive phrase about pictures. Pt continues to generate statements such as "he wants a banana, he gets a banana," instead of "he is peeling the banana, he is eating the banana." Pt was independently oriented to all except location. SLP further facilitated session by providing maximal cues as well as audio-visual  feedback to increase volume and articulatory precision at the simple phrase level. With this amount of support, she was able to increase her vocal intensity from 66dB to 84dB. As a result, her speech intelligibility increased from ~25% to 80%. She was not able to carryover these strategies into casual remarks.    Pt continues to eagerly participate in skilled ST sessions and has demonstrated improvement in recall of orientation information but continues to exhibit severe impairments in verbal expression, basic problem solving, awareness and generalization of concepts. As such, pt continues to require skilled ST intervention to address these deficits and increase her functional independence thereby reducing caregiver burden.               SLP Short Term Goals - 02/11/21 1156      SLP SHORT TERM GOAL #1   Title With moderate cues, pt will demonstrate selective attention in mildly distracting environment for 15 minutes.    Baseline sustained attention    Time 10    Period --   sessions   Status New      SLP SHORT TERM GOAL #2   Title With minimal cues, pt will utilize speech intelligibility strategies to achieve > 90% intelligibility at the phrase level.    Baseline moderate assistance to achieve ~ 50% intelligibility at phrase level    Time 10    Period --  sessions   Status New      SLP SHORT TERM GOAL #3   Title With minimal cues, pt will utilize external memory aids for recall of family members' names and ages with 95% accuracy.    Baseline 50% accurate    Time 10    Period --   sessions   Status New      SLP SHORT TERM GOAL #4   Title With minimal assistance, pt will demonstrate functional problem solving for basic and familiar tasks to achieve >95% accuracy.    Baseline moderate cues    Time 10    Period --   sessions   Status New      SLP SHORT TERM GOAL #5   Title Pt will consume current diet without any reports s/s of dysphagia by pt or family members.    Baseline  upgraded to dysphagia 3 diet on 02/11/2021    Time 10    Period --   session   Status New            SLP Long Term Goals - 02/11/21 1202      SLP LONG TERM GOAL #1   Title Pt will consume safest, least restrictive diet without overt s/s of dysphagia, aspiration or respiratory distress.    Baseline upgraded to dysphagia 3 diet on 02/11/2021    Time 12    Period Weeks    Status New    Target Date 05/06/21      SLP LONG TERM GOAL #2   Title Pt will independently complete functional problem solving for basic and familiar tasks.    Baseline moderate assistance    Time 12    Period Weeks    Status New    Target Date 05/06/21      SLP LONG TERM GOAL #3   Title Pt will demonstrate selective attention in a moderately distracting environment for 15 minutes with < 1 redirection to task.    Baseline sustained attention    Time 12    Period Weeks    Status New    Target Date 05/06/21      SLP LONG TERM GOAL #4   Title With minimal cues, pt will increase speech intelligibility to > 75% at the sentence level.    Baseline 50% at the phrase level    Time 12    Period Weeks    Status New    Target Date 05/06/21             Patient will benefit from skilled therapeutic intervention in order to improve the following deficits and impairments:   Cognitive communication deficit  Anoxic brain injury [facility])    Problem List Patient Active Problem List   Diagnosis Date Noted  . Dysarthria and anarthria 02/11/2021  . Sinus tachycardia   . Labile blood glucose   . Dysphagia   . Seizure prophylaxis   . Malnutrition of moderate degree 01/23/2021  . Anoxic brain injury ([place]) 01/10/2021  . Prediabetes   . Dyslipidemia   . S/P percutaneous endoscopic gastrostomy (PEG) tube placement ([place])   . Status post tracheostomy ([place])   . Aspiration pneumonia of both lower lobes due to gastric secretions ([place]) 01/05/2021  . Acute respiratory failure ([place])   . Hypoxic ischemic encephalopathy    . Endotracheal tube present   . Cardiac arrest ([place]) 12/02/2020  . Essential hypertension 12/09/2011   Jeson Camacho B. Rutherford Nail M.S., CCC-SLP, [place] Office [phone]  Gloris Shiroma  Adelae Yodice 03/13/2021, 7:45 AM  [place] MAIN [facility] SERVICES [address] [place], [place], [address] Phone: [phone]   Fax:  [phone]   Name: KEYETTA HOLLINGWORTH MRN: [id number] Date of Birth: 1966/03/15

## 2021-03-17 ENCOUNTER — Telehealth: Payer: Self-pay | Admitting: Adult Health Nurse Practitioner

## 2021-03-17 NOTE — Telephone Encounter (Signed)
Called to remind of tomorrow's appointment at [place].  Left VM with reason for call and call back info [name] [name]. Olena Heckle NP

## 2021-03-18 ENCOUNTER — Other Ambulatory Visit: Payer: Self-pay | Admitting: Adult Health Nurse Practitioner

## 2021-03-18 ENCOUNTER — Ambulatory Visit: Payer: Self-pay

## 2021-03-18 ENCOUNTER — Ambulatory Visit: Payer: Self-pay | Admitting: Occupational Therapy

## 2021-03-18 ENCOUNTER — Encounter: Payer: Self-pay | Admitting: Occupational Therapy

## 2021-03-18 ENCOUNTER — Ambulatory Visit: Payer: Self-pay | Admitting: Speech Pathology

## 2021-03-18 ENCOUNTER — Other Ambulatory Visit: Payer: Self-pay

## 2021-03-18 ENCOUNTER — Telehealth: Payer: Self-pay | Admitting: Adult Health Nurse Practitioner

## 2021-03-18 DIAGNOSIS — R278 Other lack of coordination: Secondary | ICD-10-CM

## 2021-03-18 DIAGNOSIS — M6281 Muscle weakness (generalized): Secondary | ICD-10-CM

## 2021-03-18 DIAGNOSIS — G931 Anoxic brain damage, not elsewhere classified: Secondary | ICD-10-CM

## 2021-03-18 DIAGNOSIS — R2689 Other abnormalities of gait and mobility: Secondary | ICD-10-CM

## 2021-03-18 DIAGNOSIS — R2681 Unsteadiness on feet: Secondary | ICD-10-CM

## 2021-03-18 DIAGNOSIS — R41841 Cognitive communication deficit: Secondary | ICD-10-CM

## 2021-03-18 NOTE — Telephone Encounter (Signed)
Spoke with patient's husband to reschedule the Palliative Consult, this was rescheduled for 03/20/21 @ 9 AM.

## 2021-03-18 NOTE — Therapy (Signed)
[place] MAIN [facility] SERVICES [address] [place], [place], [address] Phone: [phone]   Fax:  [phone]  Occupational Therapy Treatment  Patient Details  Name: [name] [name] MRN: [id number] Date of Birth: [birth year] No data recorded  Encounter Date: 03/18/2021   OT End of Session - 03/18/21 1639    Visit Number 7    Number of Visits 24    Date for OT Re-Evaluation 05/13/21    Authorization Type Progress reports period starting 02/11/2021    OT Start Time 1500    OT Stop Time 1550    OT Time Calculation (min) 50 min    Activity Tolerance Patient tolerated treatment well    Behavior During Therapy [facility] for tasks assessed/performed           Past Medical History:  Diagnosis Date  . Hypertension     Past Surgical History:  Procedure Laterality Date  . PEG PLACEMENT N/A 12/13/2020   Procedure: PERCUTANEOUS ENDOSCOPIC GASTROSTOMY (PEG) PLACEMENT;  Surgeon: Lesly Rubenstein, MD;  Location: [facility] ENDOSCOPY;  Service: Endoscopy;  Laterality: N/A;  . TRACHEOSTOMY TUBE PLACEMENT N/A 12/13/2020   Procedure: TRACHEOSTOMY;  Surgeon: Clyde Canterbury, MD;  Location: [facility] ORS;  Service: ENT;  Laterality: N/A;    There were no vitals filed for this visit.   Subjective Assessment - 03/18/21 1639    Subjective  Pt. reports doing well today.    Pertinent History Pt. is a 55 y.o. female who went into cardiac arrest, and was admitted to [facility] on 12/02/2020. Pt. was diagnosed with Anoxic Brain Injury following the Cardiac Arrest. When medically stable, the pt. was tranferred to [facility]. The patient was discharged home on 02/08/2021, and is ready to begin outpatient OT services. Pt. was independent, and working prior to the onset. Pt. Has very supportive family. Pt. Enjoys gardening.    Currently in Pain? No/denies          OT TREATMENT    Selfcare:  Pt. worked on typing a blank simulated email correspondence by copying the  inital text from a sheet of paper. Pt. worked on repetition with typing the moderately complex sentence. Pt. completed 5 reps of the typed sentence. Pt. worked on completing a 5 min. typing test. Pt. typed 2 wpm with 93% accuracy. Reviewed ideas of typing activities to work on at home.  Pt. required increased cues initially to type the first 2 sentences on a laptop computer, however required fewer cues as the sentences progressed. Pt. required cues for using the backspace key, as well as the shift key to for upper case capital letters. Pt. continues to require additional work on navigating a computer, and typing in preparation for sending an Ecologist.                           OT Education - 03/18/21 1639    Education Details IADLs    Person(s) Educated Patient;Child(ren)    Methods Explanation;Demonstration    Comprehension Verbalized understanding;Returned demonstration               OT Long Term Goals - 02/11/21 1459      OT LONG TERM GOAL #1   Title Pt. will be able to independently sign her name with 100% legibility.    Baseline Eval: Signature not legible, however was able to sign on the line with no deviation above, or below the line.  Time 12    Period Weeks    Status New    Target Date 05/13/21      OT LONG TERM GOAL #2   Title Pt. will increase right shoulder AROM to be able to independently retrieve items from closets, and cabinets.    Baseline Eval: Pt. presents with limited ROM.    Time 12    Period Weeks    Status New    Target Date 05/13/21      OT LONG TERM GOAL #3   Title Pt. will increase right grip strength by 5# to be able hold a full glass    Baseline Eval: limited grip strength. Limited bilateral digit flexion, unable to make a full composite fist.    Time 12    Period Weeks    Status New    Target Date 05/13/21      OT LONG TERM GOAL #4   Title Pt. will independently access a computer, and type a one sentence  email correspondence.    Baseline Eval: Pt. is unable to to perform    Time 12    Period Weeks    Status New    Target Date 05/13/21      OT LONG TERM GOAL #5   Title Pt. will improve bilateral [facility] skills to be able to grasp, and manipulate small objects during ADLs.    Baseline Eval: Pt. presents withlimite [facility] skills with difficulty picking,a nd manipulating small objects.    Time 12    Period Weeks    Status New    Target Date 05/13/21      OT LONG TERM GOAL #6   Title Pt. will improve UE strength by 2 mm grades to assist with ADLs, and IADLS,    Baseline Eval: pt. has difficulty performing.    Time 12    Period Weeks    Status New    Target Date 05/13/21      OT LONG TERM GOAL #7   Title Pt. will complete light meal preparation with minA    Baseline Eval: Pt. is unable to prepare light meal preparation.    Time 12    Period Weeks    Status New    Target Date 05/13/21      OT LONG TERM GOAL #8   Title Pt. will perform light home management tasks with minA    Baseline Eval: pt. is unable to perform    Time 12    Period Weeks    Status New    Target Date 05/13/21                 Plan - 03/18/21 1641    Clinical Impression Statement Pt. required increased cues initially to type the first 2 sentences on a laptop computer, however required fewer cues as the sentences progressed. Pt. required cues for using the backspace key, as well as the shift key to for upper case capital letters. Pt. continues to require additional work on navigating a computer, and typing in preparation for sending an Ecologist.   OT Occupational Profile and History Problem Focused Assessment - Including review of records relating to presenting problem    Occupational performance deficits (Please refer to evaluation for details): ADL's;IADL's    Body Structure / Function / Physical Skills ADL;IADL;FMC;ROM;Strength    Rehab Potential Good    Clinical Decision Making Several treatment  options, min-mod task modification necessary    Modification or Assistance to Complete Evaluation  Min-Moderate modification of tasks or assist with assess necessary to complete eval    OT Frequency 2x / week    OT Duration 12 weeks    OT Treatment/Interventions Self-care/ADL training;Neuromuscular education;Therapeutic activities;Patient/family education;DME and/or AE instruction;Visual/perceptual remediation/compensation    Consulted and Agree with Plan of Care Patient           Patient will benefit from skilled therapeutic intervention in order to improve the following deficits and impairments:   Body Structure / Function / Physical Skills: ADL,IADL,FMC,ROM,Strength       Visit Diagnosis: Muscle weakness (generalized)  Other lack of coordination    Problem List Patient Active Problem List   Diagnosis Date Noted  . Dysarthria and anarthria 02/11/2021  . Sinus tachycardia   . Labile blood glucose   . Dysphagia   . Seizure prophylaxis   . Malnutrition of moderate degree 01/23/2021  . Anoxic brain injury ([place]) 01/10/2021  . Prediabetes   . Dyslipidemia   . S/P percutaneous endoscopic gastrostomy (PEG) tube placement ([place])   . Status post tracheostomy ([place])   . Aspiration pneumonia of both lower lobes due to gastric secretions ([place]) 01/05/2021  . Acute respiratory failure ([place])   . Hypoxic ischemic encephalopathy   . Endotracheal tube present   . Cardiac arrest ([place]) 12/02/2020  . Essential hypertension 12/09/2011    Harrel Carina, MS, OTR/L 03/18/2021, 4:42 PM  [place] MAIN [facility] SERVICES [address] [place], [place], [address] Phone: [phone]   Fax:  [phone]  Name: [name] [name] MRN: [id number] Date of Birth: [birth year]

## 2021-03-18 NOTE — Therapy (Signed)
[place] MAIN [facility] SERVICES [address] [place], [place], [address] Phone: [phone]   Fax:  [phone]  Physical Therapy Treatment  Patient Details  Name: [name] [name] MRN: [id number] Date of Birth: 09-24-1966 Referring Provider (PT): D. Erin- PA-C   Encounter Date: 03/18/2021   PT End of Session - 03/18/21 1555    Visit Number 7    Number of Visits 25    Date for PT Re-Evaluation 05/08/21    Authorization Type 03/24/202- 05/08/2021    PT Start Time 1303    PT Stop Time 1345    PT Time Calculation (min) 42 min    Equipment Utilized During Treatment Gait belt    Activity Tolerance Patient tolerated treatment well    Behavior During Therapy WFL for tasks assessed/performed           Past Medical History:  Diagnosis Date  . Hypertension     Past Surgical History:  Procedure Laterality Date  . PEG PLACEMENT N/A 12/13/2020   Procedure: PERCUTANEOUS ENDOSCOPIC GASTROSTOMY (PEG) PLACEMENT;  Surgeon: Lesly Rubenstein, MD;  Location: [facility] ENDOSCOPY;  Service: Endoscopy;  Laterality: N/A;  . TRACHEOSTOMY TUBE PLACEMENT N/A 12/13/2020   Procedure: TRACHEOSTOMY;  Surgeon: Clyde Canterbury, MD;  Location: [facility] ORS;  Service: ENT;  Laterality: N/A;    There were no vitals filed for this visit.   Subjective Assessment - 03/18/21 1554    Subjective Pt reports no pain, denies falls or near-falls. She says she has been walking at home for HEP. Pt says she is most concerned about going up and down stone steps at her home. She reports rails on B sides of stone steps.    Patient is accompained by: Family member   Husband   Pertinent History Patient experienced a Cardiac arrest on 12/02/2020 with subsequent anoxic brain injury requiring intubation.    Limitations Lifting;Standing;Walking;Writing;House hold activities    How long can you sit comfortably? No issues    How long can you stand comfortably? Limited due to fatigue.    How long  can you walk comfortably? Limited secondary to fatigue.    Patient Stated Goals Improve my balance and mobility back to independent    Currently in Pain? No/denies         TREATMENT   NEURO: CGA-min a for all of the following   Ascending/descending stairs - 4x use of no UE support and use of B UE support on rails. Without UE support pt ascending exhibits recip. Pattern, and descending step-to. With BUE support she demonstrates recip. Pattern both ascending/descending.  Step up onto 6" step/complaint surface, forward/backward, UUE support to intermittent, close CGA - 3x15 times alternating leading leg.  VC/demo/TC for technique.  Step up onto airex pad forward/backward, side-to-side, close CGA, intermittent UE support - x multiple reps each. VC/demo for technique.   Lunge from airexpad onto 6" step with twists/reaches and cone stacking (focus on R UE stacking cones) - 6x.  VC/TC for technique. Pt rates medium.  Standing on airex pad:  -alternate toe taps to cones (cones on 6" step increasing height), close CGA - x multiple reps. Frequent cuing for technique.   Side stepping over orange hurdle forward/backward, side-to-side - x multiple reps for each; cuing to clear hurdle. Pt multiple instances of knocking over hurdle. She has greater difficulty clearing LLE, decreased dorsiflexion.   Standing heel taps on cones to emphasize dorsiflexion - x multiple reps each LE. Ankles tremulous. Pt difficulty with  sequencing movement. Frequent cuing provided.   THEREX:  Seated dorsiflexion - 2x12; VC/TC/Demo for technique.    2.5# AW: at support surface, close CGA Standing hip abduction - 1x10 BLEs; frequent VC/TC and demo for technique. Pt difficulty keeping LE straight/engaging knee extension B. Standing marches -x20; Demo/VC for technique Seated LAQ - 2x20; Demo VC/ for technique     Education provided throughout session via VC/TC and demonstration to facilitate movement at target joints and  correct muscle activation for all testing and exercises performed.   PT Short Term Goals - 02/13/21 2239      PT SHORT TERM GOAL #1   Title Pt will be independent with HEP in order to improve strength and balance in order to decrease fall risk and improve function at home and work.    Baseline 02/13/2021- Patient has no formal HEP in place.    Time 6    Period Weeks    Status New    Target Date 03/27/21             PT Long Term Goals - 02/13/21 2237      PT LONG TERM GOAL #1   Title Pt will improve FOTO to target score to 77 to display perceived improvements in ability to complete ADL's.    Baseline 02/13/2021= FOTO score-69    Time 12    Period Weeks    Status New    Target Date 05/08/21      PT LONG TERM GOAL #2   Title Pt will decrease 5TSTS by at least 3 seconds in order to demonstrate clinically significant improvement in LE strength.    Baseline 02/13/2021= 15.0 sec without UE support    Time 12    Period Weeks    Status New    Target Date 05/08/21      PT LONG TERM GOAL #3   Title Patient will increase RLE gross strength to 4+/5 as to improve functional strength for independent gait, increased standing tolerance and increased ADL ability.    Baseline 02/13/2021- Patient presents with 4/5 right Hip flex/knee flex/ext, ankle DF    Time 12    Period Weeks    Status New    Target Date 05/08/21      PT LONG TERM GOAL #4   Title Patient will be modified independent in walking on even/uneven surface with least restrictive assistive device, for 10+ minutes without rest break, reporting some difficulty or less to improve walking tolerance with community ambulation including grocery shopping, going to [place] 02/13/2021- Patient presents with limited ambulation around 120 feet with decreased coordination and fatigue.    Time 12    Period Weeks    Status New    Target Date 05/08/21      PT LONG TERM GOAL #5   Title Patient will increase Functional Gait  Assessment score to >15 /30 as to reduce fall risk and improve dynamic gait safety with community ambulation.    Baseline 02/13/2021: FGA= 8/20    Time 12    Period Weeks    Status New    Target Date 05/08/21                 Plan - 03/18/21 1555    Clinical Impression Statement Pt continues to be highly motivated throughout session. She was most challeneged with sequencing of LE movement with clearing a hurdle or performing heel taps on cones. Due to this difficulty pt had difficulty clearing  steps/hurdle with exercises, requiring UE support, CGA to maintain balance. Overall, pt requires frequent multi-modal cuing for exercise technique due to cognition. The pt will continue to benefit from further skilled PT to improve balance, strength, and overall functional mobility to increase safety with all activities.    Personal Factors and Comorbidities Comorbidity 3+    Comorbidities Anoxic brain injury, Sinus tachycardia, Dysphagia    Examination-Activity Limitations Caring for Others;Carry;Lift;Squat;Stairs    Examination-Participation Restrictions Cleaning;Community Activity;Driving;Laundry;Occupation;Personal Finances;Yard Work    Stability/Clinical Decision Making Stable/Uncomplicated    Rehab Potential Good    PT Frequency 2x / week    PT Duration 12 weeks    PT Treatment/Interventions ADLs/Self Care Home Management;Cryotherapy;Moist Heat;DME Instruction;Gait training;Stair training;Functional mobility training;Therapeutic activities;Therapeutic exercise;Balance training;Neuromuscular re-education;Cognitive remediation;Patient/family education    PT Next Visit Plan Initiate LE strengthening, Coordination/balance activities., continue POC as indicated previously    PT Home Exercise Plan Added standing LE strengthening HEP today and issued handout    Consulted and Agree with Plan of Care Patient;Family member/caregiver    Family Member Consulted Pt's aunt           Patient will  benefit from skilled therapeutic intervention in order to improve the following deficits and impairments:  Abnormal gait,Decreased activity tolerance,Decreased balance,Decreased cognition,Decreased coordination,Decreased endurance,Decreased mobility,Difficulty walking,Decreased strength  Visit Diagnosis: Other lack of coordination  Other abnormalities of gait and mobility  Unsteadiness on feet  Muscle weakness (generalized)     Problem List Patient Active Problem List   Diagnosis Date Noted  . Dysarthria and anarthria 02/11/2021  . Sinus tachycardia   . Labile blood glucose   . Dysphagia   . Seizure prophylaxis   . Malnutrition of moderate degree 01/23/2021  . Anoxic brain injury ([place]) 01/10/2021  . Prediabetes   . Dyslipidemia   . S/P percutaneous endoscopic gastrostomy (PEG) tube placement ([place])   . Status post tracheostomy ([place])   . Aspiration pneumonia of both lower lobes due to gastric secretions ([place]) 01/05/2021  . Acute respiratory failure ([place])   . Hypoxic ischemic encephalopathy   . Endotracheal tube present   . Cardiac arrest ([place]) 12/02/2020  . Essential hypertension 12/09/2011   Ricard Dillon PT, DPT 03/18/2021, 4:08 PM  [place] MAIN [facility] SERVICES [address] [place], [place], [address] Phone: [phone]   Fax:  [phone]  Name: [name] [name] MRN: [id number] Date of Birth: 03/23/1966

## 2021-03-19 ENCOUNTER — Other Ambulatory Visit: Payer: Self-pay

## 2021-03-19 ENCOUNTER — Encounter
Admission: RE | Admit: 2021-03-19 | Discharge: 2021-03-19 | Disposition: A | Discharge status: Home / Self Care | Payer: Self-pay | Source: Ambulatory Visit | Attending: Physician Assistant | Admitting: Physician Assistant

## 2021-03-19 DIAGNOSIS — R911 Solitary pulmonary nodule: Secondary | ICD-10-CM | POA: Insufficient documentation

## 2021-03-19 IMAGING — CT NM PET TUM IMG INITIAL (PI) SKULL BASE T - THIGH
1 of 10 series · 1 of 25 positions shown · non-contrast
Comparison: Coronary CTA [DATE]. Chest radiograph [DATE]
reviewed.

CLINICAL DATA: Initial treatment strategy for right lower lobe lung
nodule on cardiac CTA.

EXAM:
NUCLEAR MEDICINE PET SKULL BASE TO THIGH
TECHNIQUE: 9.1 mCi F-18 FDG was injected intravenously. Full-ring PET imaging
was performed from the skull base to thigh after the radiotracer. CT
data was obtained and used for attenuation correction and anatomic
localization.
Fasting blood glucose: 86 mg/dl

[Series 3: ct wb 5.0 b30f · axial · 5.0mm · 0.98mm/px · 1 of 290 slices shown]
[im 290/290  brain]
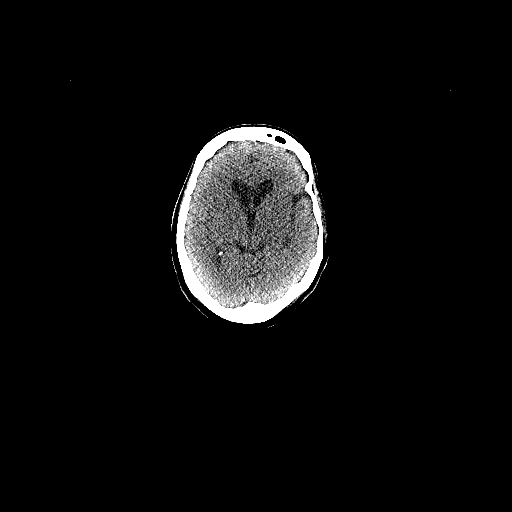

[1 of 25 positions shown; findings below may reference images not displayed]

FINDINGS: Mediastinal blood pool activity: SUV max

Liver activity: SUV max NA

NECK: No areas of abnormal hypermetabolism.

Incidental CT findings: No cervical adenopathy. Minimal mucosal
thickening of bilateral maxillary sinuses.

CHEST: Low-level right hilar and infrahilar nodal hypermetabolism
without adenopathy is favored to be reactive. The right infrahilar
hypermetabolism measures a S.U.V. max of 2.3 including on image
91/3.

The right lower lobe lung nodule measures 1.9 by 1.9 cm and is not
significantly hypermetabolic, including at a S.U.V. max of 1.5. This
measured 2.2 x 1.9 cm on the prior chest CT.

A dominant posterior left upper lobe pulmonary nodule measures
cm and a S.U.V. max of 1.1 on 67/3.

Incidental CT findings: Smaller bilateral pulmonary nodules as on
prior chest CT.

ABDOMEN/PELVIS: An aortocaval node measures 5 mm a S.U.V. max of
on 156/3.

Left adrenal hypermetabolism laterally corresponds to low-density
nodularity, favoring an adenoma. Example 1.3 cm and s.U.V. Max of
3.4.

Uterine enlargement and heterogeneity with 2 hypermetabolic lesions
within. Example at ar S.U.V. max of 15.4 within the right-side of
the uterine body/fundus.

Incidental CT findings: No renal calculi or hydronephrosis. Aortic
atherosclerosis. Mild pelvic floor laxity.

SKELETON: Likely degenerative hypermetabolism about both shoulders.
There is also hypermetabolism about the caudal portion of the medial
right clavicle, in the region of the anterior first right rib. No CT
correlate. Example at a S.U.V. max of 2.9.

Incidental CT findings: none
IMPRESSION: 1. Bilateral pulmonary nodules, including the dominant right lower
lobe pulmonary nodule, which are not significantly FDG avid.
Considerations include non FDG avid metastasis or
infectious/inflammatory process. Potential clinical strategies
include tissue sampling versus 3 month follow-up with CT.
2. Uterine enlargement heterogeneity, with hypermetabolic lesions
within. Most likely fibroids. Given the pulmonary nodules,
leiomyosarcoma(s) or metastasizing benign leiomyomas are
considerations.
3. Small abdominal retroperitoneal hypermetabolic node. Most likely
benign/reactive. If the patient is diagnosed with metastatic
disease, nodal metastasis would be considered.
4. Right chest wall hypermetabolism adjacent the caudal portion of
the medial clavicle. Most likely degenerative. The clinical history
on cardiac CTA describes cardiac arrest. CT occult injury from CPR
could look similar.

## 2021-03-19 MED ORDER — FLUDEOXYGLUCOSE F - 18 (FDG) INJECTION
8.8000 | Freq: Once | INTRAVENOUS | Status: AC | PRN
  Administered 2021-03-19: 9.09 via INTRAVENOUS

## 2021-03-19 NOTE — Therapy (Deleted)
[place] [facility] SERVICES [address] [place], [place], [address] Phone: [phone]   Fax:  [phone]  Speech Language Pathology Treatment Speech Therapy Progress Note   Dates of reporting period  02/11/2021   to   03/18/2021  Patient Details  Name: [name] [name] MRN: [id number] Date of Birth: 06-28-1966 Referring Provider (SLP): Marlowe Shores, PA   Encounter Date: 03/18/2021    Past Medical History:  Diagnosis Date  . Hypertension     Past Surgical History:  Procedure Laterality Date  . PEG PLACEMENT N/A 12/13/2020   Procedure: PERCUTANEOUS ENDOSCOPIC GASTROSTOMY (PEG) PLACEMENT;  Surgeon: Lesly Rubenstein, MD;  Location: [facility] ENDOSCOPY;  Service: Endoscopy;  Laterality: N/A;  . TRACHEOSTOMY TUBE PLACEMENT N/A 12/13/2020   Procedure: TRACHEOSTOMY;  Surgeon: Clyde Canterbury, MD;  Location: [facility] ORS;  Service: ENT;  Laterality: N/A;    There were no vitals filed for this visit.              SLP Short Term Goals - 03/20/21 0941      SLP SHORT TERM GOAL #1   Title Pt will independently demonstrate selective attention in mildly distracting environment for 15 minutes.    Baseline goal met (03/20/2021); upgraded    Time 10    Period --   sessions   Status Revised      SLP SHORT TERM GOAL #2   Title With moderate cues, pt will utilize speech intelligibility strategies to achieve > 90% intelligibility at the phrase level.    Baseline Goal not met in time period. Goal downgraded from minimal cues to moderate cues.    Time 10    Period --   sessions   Status Revised      SLP SHORT TERM GOAL #3   Title With moderate cues, pt will utilize compensatory memory strategies to recall therapy sessions for week as well as order of therapy in 8 out of 10 sessions.    Baseline goal met (03/20/2021); goal updated    Time 10    Period --   sessions   Status Revised      SLP SHORT TERM GOAL #4   Title With moderate assistance,  pt will complete basic problem solving tasks with >95% accuracy.    Baseline Goal met (03/20/2021); goal updated    Time 10    Period --   sessions   Status Revised      SLP SHORT TERM GOAL #5   Title Pt will consume current diet without any reports s/s of dysphagia by pt or family members.    Status Achieved            SLP Long Term Goals - 03/22/21 1447      SLP LONG TERM GOAL #1   Title Pt will consume safest, least restrictive diet without overt s/s of dysphagia, aspiration or respiratory distress.    Status Achieved      SLP LONG TERM GOAL #2   Title Pt will independently complete functional problem solving for basic and familiar tasks.    Status On-going      SLP LONG TERM GOAL #3   Title Pt will demonstrate selective attention in a moderately distracting environment for 15 minutes with < 1 redirection to task.    Status On-going      SLP LONG TERM GOAL #4   Title With minimal cues, pt will increase speech intelligibility to > 75% at the sentence level.  Status On-going             Patient will benefit from skilled therapeutic intervention in order to improve the following deficits and impairments:   Cognitive communication deficit  Anoxic brain injury [facility])    Problem List Patient Active Problem List   Diagnosis Date Noted  . Dysarthria and anarthria 02/11/2021  . Sinus tachycardia   . Labile blood glucose   . Dysphagia   . Seizure prophylaxis   . Malnutrition of moderate degree 01/23/2021  . Anoxic brain injury ([place]) 01/10/2021  . Prediabetes   . Dyslipidemia   . S/P percutaneous endoscopic gastrostomy (PEG) tube placement ([place])   . Status post tracheostomy ([place])   . Aspiration pneumonia of both lower lobes due to gastric secretions ([place]) 01/05/2021  . Acute respiratory failure ([place])   . Hypoxic ischemic encephalopathy   . Endotracheal tube present   . Cardiac arrest ([place]) 12/02/2020  . Essential hypertension 12/09/2011   Luisdaniel Kenton B. Rutherford Nail  M.S., CCC-SLP, [place] Pathologist Rehabilitation Services Office [phone]  Stormy Fabian 03/22/2021, 2:48 PM  [place] [facility] SERVICES [address] [place], [place], [address] Phone: [phone]   Fax:  [phone]   Name: [name] [name] MRN: [id number] Date of Birth: 21-Dec-1965

## 2021-03-20 ENCOUNTER — Ambulatory Visit: Payer: Self-pay

## 2021-03-20 ENCOUNTER — Encounter: Payer: Self-pay | Admitting: Adult Health Nurse Practitioner

## 2021-03-20 ENCOUNTER — Other Ambulatory Visit: Payer: Self-pay | Admitting: Adult Health Nurse Practitioner

## 2021-03-20 DIAGNOSIS — R471 Dysarthria and anarthria: Secondary | ICD-10-CM

## 2021-03-20 DIAGNOSIS — G931 Anoxic brain damage, not elsewhere classified: Secondary | ICD-10-CM

## 2021-03-20 LAB — GLUCOSE, CAPILLARY: Glucose-Capillary: 86 mg/dL (ref 70–99)

## 2021-03-20 NOTE — Patient Instructions (Addendum)
Continue working on orientation information and increase participation in activities such as helping retrieve items from pantry for meal prepartion

## 2021-03-20 NOTE — Progress Notes (Signed)
[place] Consult Note Telephone: [phone]  Fax: [phone]    Date of encounter: 03/20/21 PATIENT NAME: [name] [name] [address] [place] [address]   [phone] (home)  DOB: Nov 07, 1966 MRN: [id number] PRIMARY CARE PROVIDER:    Marinda Elk, MD,  [place] [address] [phone]  REFERRING PROVIDER:   Marinda Elk, MD [place] [facility] [place],  [place] [address] [phone]  RESPONSIBLE PARTY:    Contact Information    Name Relation Home Work Mobile   Gurganus,Darrell Spouse [phone]     Domenic Polite   [phone]       I met face to face with patient and family in home. Palliative Care was asked to follow this patient by consultation request of  Marinda Elk, MD to address advance care planning and complex medical decision making. This is the initial visit.   Husband present during visit today                                    ASSESSMENT AND PLAN / RECOMMENDATIONS:   Advance Care Planning/Goals of Care: Goals include to maximize quality of life and symptom management. Our advance care planning conversation included a discussion about:     The value and importance of advance care planning   Exploration of goals of care in the event of a sudden injury or illness   Identification and preparation of a healthcare agent ; husband is HC POA  Did leave blank MOST form for patient and family to review  CODE STATUS:  Full code  Symptom Management/Plan:  Anoxic brain injury/dysarthria: Patient is doing very well after her lengthy hospital stay and guarded condition.  She continues with outpatient therapy 3 to 4 days/week.  She is being followed by pulmonology and cardiology.  Continue follow-up and recommendations by specialists.  Continue outpatient therapy as ordered.  Follow up Palliative Care Visit:  Palliative care will continue to follow for complex medical decision making, advance care planning, and clarification of goals. Return 10 weeks or prn. Encouraged to call with any questions or concerns  I spent 60 minutes providing this consultation. More than 50% of the time in this consultation was spent in counseling and care coordination.  PPS: 60%  HOSPICE ELIGIBILITY/DIAGNOSIS: TBD  Chief Complaint: initial palliative visit  HISTORY OF PRESENT ILLNESS:  KHAI TORBERT is a 55 y.o. year old female  with anoxic brain injury, h/o cardiac arrest, prediabetes, HTN, HLD.  Patient had lengthy hospital stay from 1/10 through 02/08/2021 after being found unconscious on the floor of her home due to cardiac arrest.  Patient required intubation and was hypothermic and needed to be rewarmed.  Patient did have anoxic brain injury due to this.  She required PEG tube and trach placement.  Patient was in coma for 32 days.  She was transferred to [facility].  Patient did awaken PEG tube was removed on 02/06/2021.  Patient did receive long-term therapy while hospitalized.  She is now back at home with her husband and family and goes to outpatient rehab where she receives PT/OT/ST/recreational therapy.  Patient is ambulatory without assistive devices.  She is independent of ADLs and can do some light housework.  Does need help with cooking.  Patient does state that she has forgetfulness and she keeps a notebook in which  she writes things down.  She denies difficulty with swallowing and states that her appetite is good with no reported weight loss.  Her current weight is 170 pounds.  Patient's speech is slowed but clear.  She is alert and oriented x3, able to hold a conversation, answers questions appropriately, able to follow commands.  She denies pain, headaches, dizziness, shortness of breath, cough, N/V/D, constipation, dysuria, hematuria, depression, anxiety.  History obtained from review of EMR and interview with family  and [name] [name].  I reviewed available labs, medications, imaging, studies and related documents from the EMR.  Records reviewed and summarized above.    Physical Exam:  Constitutional: NAD General: WNWD EYES: anicteric sclera, lids intact, no discharge  ENMT: intact hearing, oral mucous membranes moist CV: S1S2, RRR, no LE edema Pulmonary:  no increased work of breathing, no cough MSK: moves all extremities, ambulatory Skin: no rashes or wounds on visible skin Neuro:  A and O x 3; has forgetfulness; slow speech Hem/lymph/immuno: no widespread bruising   CURRENT PROBLEM LIST:  Patient Active Problem List   Diagnosis Date Noted  . Dysarthria and anarthria 02/11/2021  . Sinus tachycardia   . Labile blood glucose   . Dysphagia   . Seizure prophylaxis   . Malnutrition of moderate degree 01/23/2021  . Anoxic brain injury ([place]) 01/10/2021  . Prediabetes   . Dyslipidemia   . S/P percutaneous endoscopic gastrostomy (PEG) tube placement ([place])   . Status post tracheostomy ([place])   . Aspiration pneumonia of both lower lobes due to gastric secretions ([place]) 01/05/2021  . Acute respiratory failure ([place])   . Hypoxic ischemic encephalopathy   . Endotracheal tube present   . Cardiac arrest ([place]) 12/02/2020  . Essential hypertension 12/09/2011   PAST MEDICAL HISTORY:  Active Ambulatory Problems    Diagnosis Date Noted  . Cardiac arrest ([place]) 12/02/2020  . Acute respiratory failure ([place])   . Hypoxic ischemic encephalopathy   . Endotracheal tube present   . Essential hypertension 12/09/2011  . Aspiration pneumonia of both lower lobes due to gastric secretions ([place]) 01/05/2021  . Anoxic brain injury ([place]) 01/10/2021  . Prediabetes   . Dyslipidemia   . S/P percutaneous endoscopic gastrostomy (PEG) tube placement ([place])   . Status post tracheostomy ([place])   . Malnutrition of moderate degree 01/23/2021  . Sinus tachycardia   . Labile blood glucose   . Dysphagia   . Seizure prophylaxis    . Dysarthria and anarthria 02/11/2021   Resolved Ambulatory Problems    Diagnosis Date Noted  . No Resolved Ambulatory Problems   Past Medical History:  Diagnosis Date  . Hypertension    SOCIAL HX:  Social History   Tobacco Use  . Smoking status: Former Smoker    Quit date: 2007    Years since quitting: 15.3  . Smokeless tobacco: Never Used  Substance Use Topics  . Alcohol use: No   FAMILY HX:  Family History  Problem Relation Age of Onset  . Hypertension Mother   . Leukemia Mother   . Diabetes Mother   . Healthy Father       ALLERGIES: No Known Allergies   PERTINENT MEDICATIONS:  Outpatient Encounter Medications as of 03/20/2021  Medication Sig  . amLODipine (NORVASC) 10 MG tablet Take 1 tablet (10 mg total) by mouth daily.  Marland Kitchen atorvastatin (LIPITOR) 80 MG tablet Take 1 tablet (80 mg total) by mouth daily.  Marland Kitchen levETIRAcetam (KEPPRA) 750 MG tablet Take 1 tablet (750 mg total)  by mouth 2 (two) times daily.  Marland Kitchen levETIRAcetam (KEPPRA) 750 MG tablet TAKE 1 TABLET (750 MG TOTAL) BY MOUTH TWO TIMES DAILY.  . methylphenidate (RITALIN) 5 MG tablet TAKE 1 TABLET (5 MG TOTAL) BY MOUTH TWO TIMES DAILY WITH BREAKFAST AND LUNCH.  . metoprolol tartrate (LOPRESSOR) 25 MG tablet Take 1 tablet (25 mg total) by mouth 2 (two) times daily.  . potassium chloride SA (KLOR-CON M20) 20 MEQ tablet Take 2 tablets (40 mEq total) by mouth daily.  Marland Kitchen saccharomyces boulardii (FLORASTOR) 250 MG capsule Take 1 capsule (250 mg total) by mouth 2 (two) times daily.   No facility-administered encounter medications on file as of 03/20/2021.    Thank you for the opportunity to participate in the care of Ms. Liebig.  The palliative care team will continue to follow. Please call our office at [phone] if we can be of additional assistance.   Shyah Cadmus Jenetta Downer, NP , DNP  This chart was dictated using voice recognition software. Despite best efforts to proofread, errors can occur which can change the  documentation meaning.   COVID-19 PATIENT SCREENING TOOL Asked and negative response unless otherwise noted:   Have you had symptoms of covid, tested positive or been in contact with someone with symptoms/positive test in the past 5-10 days? negative

## 2021-03-21 ENCOUNTER — Ambulatory Visit: Payer: Self-pay

## 2021-03-21 ENCOUNTER — Ambulatory Visit: Payer: Self-pay | Admitting: Speech Pathology

## 2021-03-21 ENCOUNTER — Other Ambulatory Visit: Payer: Self-pay

## 2021-03-21 DIAGNOSIS — R2681 Unsteadiness on feet: Secondary | ICD-10-CM

## 2021-03-21 DIAGNOSIS — R278 Other lack of coordination: Secondary | ICD-10-CM

## 2021-03-21 DIAGNOSIS — M6281 Muscle weakness (generalized): Secondary | ICD-10-CM

## 2021-03-21 DIAGNOSIS — R2689 Other abnormalities of gait and mobility: Secondary | ICD-10-CM

## 2021-03-21 DIAGNOSIS — R41841 Cognitive communication deficit: Secondary | ICD-10-CM

## 2021-03-21 DIAGNOSIS — G931 Anoxic brain damage, not elsewhere classified: Secondary | ICD-10-CM

## 2021-03-21 NOTE — Therapy (Signed)
[place] MAIN [facility] SERVICES [address] [place], [place], [address] Phone: [phone]   Fax:  [phone]  Physical Therapy Treatment  Patient Details  Name: [name] [name] MRN: [id number] Date of Birth: 06-13-1966 Referring Provider (PT): D. Poynor- PA-C   Encounter Date: 03/21/2021   PT End of Session - 03/21/21 1058    Visit Number 8    Number of Visits 25    Date for PT Re-Evaluation 05/08/21    Authorization Type 03/24/202- 05/08/2021    PT Start Time 1003    PT Stop Time 1047    PT Time Calculation (min) 44 min    Equipment Utilized During Treatment Gait belt    Activity Tolerance Patient tolerated treatment well    Behavior During Therapy WFL for tasks assessed/performed           Past Medical History:  Diagnosis Date  . Hypertension     Past Surgical History:  Procedure Laterality Date  . PEG PLACEMENT N/A 12/13/2020   Procedure: PERCUTANEOUS ENDOSCOPIC GASTROSTOMY (PEG) PLACEMENT;  Surgeon: Lesly Rubenstein, MD;  Location: [facility] ENDOSCOPY;  Service: Endoscopy;  Laterality: N/A;  . TRACHEOSTOMY TUBE PLACEMENT N/A 12/13/2020   Procedure: TRACHEOSTOMY;  Surgeon: Clyde Canterbury, MD;  Location: [facility] ORS;  Service: ENT;  Laterality: N/A;    There were no vitals filed for this visit.   Subjective Assessment - 03/21/21 1005    Subjective Pt reports no pain and denies falls or near-falls.    Patient is accompained by: Family member   Husband   Pertinent History Patient experienced a Cardiac arrest on 12/02/2020 with subsequent anoxic brain injury requiring intubation.    Limitations Lifting;Standing;Walking;Writing;House hold activities    How long can you sit comfortably? No issues    How long can you stand comfortably? Limited due to fatigue.    How long can you walk comfortably? Limited secondary to fatigue.    Patient Stated Goals Improve my balance and mobility back to independent    Currently in Pain? No/denies            TREATMENT   NEURO: CGA-min a for all of the following  Forward/backward stepping over orange hurdle - x multiple reps. Close CGA. VC/TC/Demo for technique (emphasis on dorsiflexion) to clear hurdle and increase step length. Pt shows within-session improvement after cuing/ability to self-correct.   Side stepping over orange hurdle forward/backward, side-to-side - 2x15 BLEs. VC for technique.  NBOS, throwing ball at target using RUE - x multiple reps. Multi-modal cuing for technique. Pt reports moderate difficulty, but exhibits no loss in postural stability.  Standing on airex pad:  NBOS throwing ball at target using RUE - x multiple reps. Slight decrease in postural stability. Semi-tandem throwing ball at target using RUE - x multiple reps, close CGA. Pt reports exercise is difficult.   Ambulating with horizontal, vertical head turns 6x each over 10 meter track. Pt greater difficulty with horizontal head turns. Frequent cuing required d/t cognition.   Ambulation around clinic gym with dual task (naming friends, counting up) 4x.  CGA provided. Pt at times stops ambulating to think about naming friends or slows gait speed.  Ambulating around cones with stepping over box with dual task (counting up, counting down from 100) - 6x. Difficulty with task. Staggers, slows down, increased variability in BOS. Close CGA provided throughout. Pt rates exercise as very difficult.    THEREX:  Seated dorsiflexion - 1x20 with 2.5# AW; pt rates easy;  1x20 with 3# AW, pt rates "easy"; VC/TC/Demo for technique.   Standing dorsiflexion at support surface - 2x15. Pt compensates with backward rocking. Able to correct for technique after frequent cuing/demo.  3# AW: at support surface, CGA Standing hip abduction - 2x10 BLEs; frequent VC/TC and demo for technique. Pt difficulty keeping LE straight/engaging knee extension B. Standing hip extension with 3# AW - 2x10 BLEs. Frequent VC/Demo for  technique.  STS - 1x10. No use of UEs.   Education provided throughout session via VC/TC and demonstration to facilitate movement at target joints and correct muscle activation for all testing and exercises performed.     PT Education - 03/21/21 1058    Education Details exercise technique, body mechanics with standing balance/target exercise    Person(s) Educated Patient    Methods Explanation;Demonstration;Tactile cues;Verbal cues    Comprehension Verbalized understanding;Returned demonstration            PT Short Term Goals - 02/13/21 2239      PT SHORT TERM GOAL #1   Title Pt will be independent with HEP in order to improve strength and balance in order to decrease fall risk and improve function at home and work.    Baseline 02/13/2021- Patient has no formal HEP in place.    Time 6    Period Weeks    Status New    Target Date 03/27/21             PT Long Term Goals - 02/13/21 2237      PT LONG TERM GOAL #1   Title Pt will improve FOTO to target score to 77 to display perceived improvements in ability to complete ADL's.    Baseline 02/13/2021= FOTO score-69    Time 12    Period Weeks    Status New    Target Date 05/08/21      PT LONG TERM GOAL #2   Title Pt will decrease 5TSTS by at least 3 seconds in order to demonstrate clinically significant improvement in LE strength.    Baseline 02/13/2021= 15.0 sec without UE support    Time 12    Period Weeks    Status New    Target Date 05/08/21      PT LONG TERM GOAL #3   Title Patient will increase RLE gross strength to 4+/5 as to improve functional strength for independent gait, increased standing tolerance and increased ADL ability.    Baseline 02/13/2021- Patient presents with 4/5 right Hip flex/knee flex/ext, ankle DF    Time 12    Period Weeks    Status New    Target Date 05/08/21      PT LONG TERM GOAL #4   Title Patient will be modified independent in walking on even/uneven surface with least restrictive  assistive device, for 10+ minutes without rest break, reporting some difficulty or less to improve walking tolerance with community ambulation including grocery shopping, going to [place] 02/13/2021- Patient presents with limited ambulation around 120 feet with decreased coordination and fatigue.    Time 12    Period Weeks    Status New    Target Date 05/08/21      PT LONG TERM GOAL #5   Title Patient will increase Functional Gait Assessment score to >15 /30 as to reduce fall risk and improve dynamic gait safety with community ambulation.    Baseline 02/13/2021: FGA= 8/20    Time 12    Period Weeks  Status New    Target Date 05/08/21                 Plan - 03/21/21 1058    Clinical Impression Statement Pt exhibits good static balance while performing hand-eye coordination task (throwing ball at target using RUE while standing on airex). Pt had greatest difficulty with ambulating/obstacle course with dual task of counting, naming friends, where she staggered, decreased gait speed, and had to stop a few times, requiring close CGA from PT. The pt will benefit from further skilled PT to improve balance, LE strength and gait to decrease fall risk.    Personal Factors and Comorbidities Comorbidity 3+    Comorbidities Anoxic brain injury, Sinus tachycardia, Dysphagia    Examination-Activity Limitations Caring for Others;Carry;Lift;Squat;Stairs    Examination-Participation Restrictions Cleaning;Community Activity;Driving;Laundry;Occupation;Personal Finances;Yard Work    Stability/Clinical Decision Making Stable/Uncomplicated    Rehab Potential Good    PT Frequency 2x / week    PT Duration 12 weeks    PT Treatment/Interventions ADLs/Self Care Home Management;Cryotherapy;Moist Heat;DME Instruction;Gait training;Stair training;Functional mobility training;Therapeutic activities;Therapeutic exercise;Balance training;Neuromuscular re-education;Cognitive remediation;Patient/family  education    PT Next Visit Plan dual task balance/gait exercises    PT Home Exercise Plan Added standing LE strengthening HEP today and issued handout    Consulted and Agree with Plan of Care Patient;Family member/caregiver    Family Member Consulted pt family member present           Patient will benefit from skilled therapeutic intervention in order to improve the following deficits and impairments:  Abnormal gait,Decreased activity tolerance,Decreased balance,Decreased cognition,Decreased coordination,Decreased endurance,Decreased mobility,Difficulty walking,Decreased strength  Visit Diagnosis: Unsteadiness on feet  Muscle weakness (generalized)  Other abnormalities of gait and mobility  Other lack of coordination     Problem List Patient Active Problem List   Diagnosis Date Noted  . Dysarthria and anarthria 02/11/2021  . Sinus tachycardia   . Labile blood glucose   . Dysphagia   . Seizure prophylaxis   . Malnutrition of moderate degree 01/23/2021  . Anoxic brain injury ([place]) 01/10/2021  . Prediabetes   . Dyslipidemia   . S/P percutaneous endoscopic gastrostomy (PEG) tube placement ([place])   . Status post tracheostomy ([place])   . Aspiration pneumonia of both lower lobes due to gastric secretions ([place]) 01/05/2021  . Acute respiratory failure ([place])   . Hypoxic ischemic encephalopathy   . Endotracheal tube present   . Cardiac arrest ([place]) 12/02/2020  . Essential hypertension 12/09/2011   Ricard Dillon PT, DPT 03/21/2021, 11:15 AM  [place] MAIN [facility] SERVICES [address] [place], [place], [address] Phone: [phone]   Fax:  [phone]  Name: [name] [name] MRN: [id number] Date of Birth: 05-19-1966

## 2021-03-22 NOTE — Therapy (Signed)
[place] MAIN [facility] SERVICES [address] [place], [place], [address] Phone: [phone]   Fax:  [phone]  Speech Language Pathology Treatment Speech Therapy Progress Note   Dates of reporting period  02/11/2021   to   03/21/2021  Patient Details  Name: [name] [name] MRN: [id number] Date of Birth: January 02, 1966 Referring Provider (SLP): Marlowe Shores, [place]   Encounter Date: 03/21/2021   End of Session - 03/22/21 1527    Visit Number 10    Number of Visits 25    Date for SLP Re-Evaluation 05/06/21    Authorization Type Medicaid    Authorization Time Period 02/11/2021 thru 05/06/2021    Authorization - Visit Number 10    Progress Note Due on Visit 10    SLP Start Time 0900    SLP Stop Time  1000    SLP Time Calculation (min) 60 min    Activity Tolerance Patient tolerated treatment well           Past Medical History:  Diagnosis Date  . Hypertension     Past Surgical History:  Procedure Laterality Date  . PEG PLACEMENT N/A 12/13/2020   Procedure: PERCUTANEOUS ENDOSCOPIC GASTROSTOMY (PEG) PLACEMENT;  Surgeon: Lesly Rubenstein, MD;  Location: [facility] ENDOSCOPY;  Service: Endoscopy;  Laterality: N/A;  . TRACHEOSTOMY TUBE PLACEMENT N/A 12/13/2020   Procedure: TRACHEOSTOMY;  Surgeon: Clyde Canterbury, MD;  Location: [facility] ORS;  Service: ENT;  Laterality: N/A;    There were no vitals filed for this visit.   Subjective Assessment - 03/22/21 1523    Subjective pt looking forward to the weekend, accompanied by her husband    Patient is accompained by: Family member    Currently in Pain? No/denies                 ADULT SLP TREATMENT - 03/22/21 1523      Cognitive-Linquistic Treatment   Skilled Treatment Skilled treatment session focused on pt's cognitive communication goals. SLP faciltiated session by introducig pt to game of SPOT IT. Pt required more than a reasonable amount of time to identify the matching shape on each card.  More specifically, she required min cues to use problem solving strategies to decrease processing times. Pt demonstated emergent awareness as she self-corrected responses x 5 times throughout game. SLP faciltiated speech intelligibility goals by presenting pt with hypothetical situations from [place]? Pt required continous maximal multimodal cues to increase speech intelligibility. Cognitive load of task likely contributed to decreased speech intelligibility.            SLP Education - 03/22/21 1526    Education Details provided    Person(s) Educated Patient;Spouse    Methods Explanation;Demonstration;Verbal cues    Comprehension Verbalized understanding;Need further instruction            SLP Short Term Goals - 03/20/21 0941      SLP SHORT TERM GOAL #1   Title Pt will independently demonstrate selective attention in mildly distracting environment for 15 minutes.    Baseline goal met (03/20/2021); upgraded    Time 10    Period --   sessions   Status Revised      SLP SHORT TERM GOAL #2   Title With moderate cues, pt will utilize speech intelligibility strategies to achieve > 90% intelligibility at the phrase level.    Baseline Goal not met in time period. Goal downgraded from minimal cues to moderate cues.    Time 10  Period --   sessions   Status Revised      SLP SHORT TERM GOAL #3   Title With moderate cues, pt will utilize compensatory memory strategies to recall therapy sessions for week as well as order of therapy in 8 out of 10 sessions.    Baseline goal met (03/20/2021); goal updated    Time 10    Period --   sessions   Status Revised      SLP SHORT TERM GOAL #4   Title With moderate assistance, pt will complete basic problem solving tasks with >95% accuracy.    Baseline Goal met (03/20/2021); goal updated    Time 10    Period --   sessions   Status Revised      SLP SHORT TERM GOAL #5   Title Pt will consume current diet without any reports s/s of  dysphagia by pt or family members.    Status Achieved            SLP Long Term Goals - 03/22/21 1447      SLP LONG TERM GOAL #1   Title Pt will consume safest, least restrictive diet without overt s/s of dysphagia, aspiration or respiratory distress.    Status Achieved      SLP LONG TERM GOAL #2   Title Pt will independently complete functional problem solving for basic and familiar tasks.    Status On-going      SLP LONG TERM GOAL #3   Title Pt will demonstrate selective attention in a moderately distracting environment for 15 minutes with < 1 redirection to task.    Status On-going      SLP LONG TERM GOAL #4   Title With minimal cues, pt will increase speech intelligibility to > 75% at the sentence level.    Status On-going            Plan - 03/22/21 1527    Clinical Impression Statement Pt continues with severe dysarthria c/b slurred speech and decreased vocal intensity that results in < 50% speech intelligibility at the spontaneous word to simple phrase level. While she continues with significant impairments in cognition, she has demonstrate progress in the areas of selective attention, basic recall of family member's names, recall of orientation information as well as oropharyngeal abilities when consuming regular diet with thin liquids. These STGs have been upgraded and are appropriate for next reporting period. Skilled ST remains necessary to target pt's expressive ability, attention, recall, problem solving, awareness as well as executive functions.    Speech Therapy Frequency 2x / week    Duration 12 weeks    Treatment/Interventions Diet toleration management by SLP;Cognitive reorganization;Compensatory techniques;Internal/external aids;SLP instruction and feedback;Patient/family education;Functional tasks;Compensatory strategies    Potential to Achieve Goals Good    Potential Considerations Severity of impairments    SLP Home Exercise Plan provided    Consulted and  Agree with Plan of Care Patient;Family member/caregiver    Family Member Consulted pt's husband           Patient will benefit from skilled therapeutic intervention in order to improve the following deficits and impairments:   Cognitive communication deficit  Anoxic brain injury [facility])    Problem List Patient Active Problem List   Diagnosis Date Noted  . Dysarthria and anarthria 02/11/2021  . Sinus tachycardia   . Labile blood glucose   . Dysphagia   . Seizure prophylaxis   . Malnutrition of moderate degree 01/23/2021  . Anoxic brain injury ([name])  01/10/2021  . Prediabetes   . Dyslipidemia   . S/P percutaneous endoscopic gastrostomy (PEG) tube placement ([place])   . Status post tracheostomy ([place])   . Aspiration pneumonia of both lower lobes due to gastric secretions ([place]) 01/05/2021  . Acute respiratory failure ([place])   . Hypoxic ischemic encephalopathy   . Endotracheal tube present   . Cardiac arrest ([place]) 12/02/2020  . Essential hypertension 12/09/2011   Dannisha Eckmann B. Rutherford Nail M.S., CCC-SLP, [place] Pathologist Rehabilitation Services Office [phone]  Stormy Fabian 03/22/2021, 3:28 PM  [place] MAIN [facility] SERVICES [address] [place], [place], [address] Phone: [phone]   Fax:  [phone]   Name: ROSANNA BICKLE MRN: [id number] Date of Birth: Nov 15, 1966

## 2021-03-22 NOTE — Therapy (Signed)
[place] MAIN [facility] SERVICES [address] [place], [place], [address] Phone: [phone]   Fax:  [phone]  Speech Language Pathology Treatment  Patient Details  Name: [name] [name] MRN: [id number] Date of Birth: Sep 18, 1966 Referring Provider (SLP): Marlowe [place], [place]   Encounter Date: 03/18/2021   End of Session - 03/22/21 1510    Visit Number 9    Number of Visits 25    Date for SLP Re-Evaluation 05/06/21    Authorization Type Medicaid    Authorization Time Period 02/11/2021 thru 05/06/2021    Authorization - Visit Number 9    Progress Note Due on Visit 10    SLP Start Time 1400    SLP Stop Time  1500    SLP Time Calculation (min) 60 min    Activity Tolerance Patient tolerated treatment well           Past Medical History:  Diagnosis Date  . Hypertension     Past Surgical History:  Procedure Laterality Date  . PEG PLACEMENT N/A 12/13/2020   Procedure: PERCUTANEOUS ENDOSCOPIC GASTROSTOMY (PEG) PLACEMENT;  Surgeon: Lesly Rubenstein, MD;  Location: [facility] ENDOSCOPY;  Service: Endoscopy;  Laterality: N/A;  . TRACHEOSTOMY TUBE PLACEMENT N/A 12/13/2020   Procedure: TRACHEOSTOMY;  Surgeon: Clyde Canterbury, MD;  Location: [facility] ORS;  Service: ENT;  Laterality: N/A;    There were no vitals filed for this visit.   Subjective Assessment - 03/22/21 1505    Subjective pt fatigued by PT session, accompanied by pt's aunt    Patient is accompained by: Family member    Currently in Pain? No/denies                 ADULT SLP TREATMENT - 03/22/21 1506      Cognitive-Linquistic Treatment   Skilled Treatment Skilled treatment session targeted pt's cognitive communicaiton goals. SLP faciltiated session by providing maximal multimodal assistance to increase vocal intensity and precise articulation to reach ~ 50%  intelligibility at the 2-3 word phrase level. Cues focused on putting forth a "level 8" amount of effort. Pt with very little  carryover into off-the-cuff remarks. SLP further facilitated session by providing maximal assistance to learn one rule of basic checkers. Cues were not able to be faded d/t pt's inability to recall how to play.            SLP Education - 03/22/21 1505    Education Details anoxic brain injury and resultant memory loss    Person(s) Educated Patient;Other (comment)    Methods Explanation;Demonstration;Verbal cues    Comprehension Verbalized understanding;Tactile cues required            SLP Short Term Goals - 03/20/21 0941      SLP SHORT TERM GOAL #1   Title Pt will independently demonstrate selective attention in mildly distracting environment for 15 minutes.    Baseline goal met (03/20/2021); upgraded    Time 10    Period --   sessions   Status Revised      SLP SHORT TERM GOAL #2   Title With moderate cues, pt will utilize speech intelligibility strategies to achieve > 90% intelligibility at the phrase level.    Baseline Goal not met in time period. Goal downgraded from minimal cues to moderate cues.    Time 10    Period --   sessions   Status Revised      SLP SHORT TERM GOAL #3   Title With moderate cues, pt will  utilize compensatory memory strategies to recall therapy sessions for week as well as order of therapy in 8 out of 10 sessions.    Baseline goal met (03/20/2021); goal updated    Time 10    Period --   sessions   Status Revised      SLP SHORT TERM GOAL #4   Title With moderate assistance, pt will complete basic problem solving tasks with >95% accuracy.    Baseline Goal met (03/20/2021); goal updated    Time 10    Period --   sessions   Status Revised      SLP SHORT TERM GOAL #5   Title Pt will consume current diet without any reports s/s of dysphagia by pt or family members.    Status Achieved            SLP Long Term Goals - 03/22/21 1447      SLP LONG TERM GOAL #1   Title Pt will consume safest, least restrictive diet without overt s/s of dysphagia,  aspiration or respiratory distress.    Status Achieved      SLP LONG TERM GOAL #2   Title Pt will independently complete functional problem solving for basic and familiar tasks.    Status On-going      SLP LONG TERM GOAL #3   Title Pt will demonstrate selective attention in a moderately distracting environment for 15 minutes with < 1 redirection to task.    Status On-going      SLP LONG TERM GOAL #4   Title With minimal cues, pt will increase speech intelligibility to > 75% at the sentence level.    Status On-going            Plan - 03/22/21 1510    Clinical Impression Statement Pt continues with severe dysarthria c/b slurred speech and decreased vocal intensity that results in < 50% speech intelligibility at the spontaneous word to simple phrase level. While she continues with significant impairments in cognition, she has demonstrate progress in the areas of selective attention, basic recall of family member's names, recall of orientation information as well as oropharyngeal abilities when consuming regular diet with thin liquids. These STGs have been upgraded and are appropriate for next reporting period. Skilled ST remains necessary to target pt's expressive ability, attention, recall, problem solving, awareness as well as executive functions.    Duration 12 weeks    Treatment/Interventions Diet toleration management by SLP;Cognitive reorganization;Compensatory techniques;Internal/external aids;SLP instruction and feedback;Patient/family education;Functional tasks;Compensatory strategies    Potential to Achieve Goals Good    Potential Considerations Severity of impairments    SLP Home Exercise Plan provided    Consulted and Agree with Plan of Care Patient;Family member/caregiver    Family Member Consulted pt's aunt           Patient will benefit from skilled therapeutic intervention in order to improve the following deficits and impairments:   Cognitive communication  deficit  Anoxic brain injury [facility])    Problem List Patient Active Problem List   Diagnosis Date Noted  . Dysarthria and anarthria 02/11/2021  . Sinus tachycardia   . Labile blood glucose   . Dysphagia   . Seizure prophylaxis   . Malnutrition of moderate degree 01/23/2021  . Anoxic brain injury ([place]) 01/10/2021  . Prediabetes   . Dyslipidemia   . S/P percutaneous endoscopic gastrostomy (PEG) tube placement ([place])   . Status post tracheostomy ([place])   . Aspiration pneumonia of both lower lobes due  to gastric secretions ([place]) 01/05/2021  . Acute respiratory failure ([place])   . Hypoxic ischemic encephalopathy   . Endotracheal tube present   . Cardiac arrest ([place]) 12/02/2020  . Essential hypertension 12/09/2011   Jamare Vanatta B. Rutherford Nail M.S., CCC-SLP, [place] Pathologist Rehabilitation Services Office [phone]  Stormy Fabian 03/22/2021, 3:12 PM  [place] MAIN [facility] SERVICES [address] [place], [place], [address] Phone: [phone]   Fax:  [phone]   Name: [name] [name] MRN: [id number] Date of Birth: Feb 27, 1966

## 2021-03-25 ENCOUNTER — Other Ambulatory Visit: Payer: Self-pay

## 2021-03-25 ENCOUNTER — Ambulatory Visit: Payer: Self-pay | Admitting: Occupational Therapy

## 2021-03-25 ENCOUNTER — Ambulatory Visit: Payer: Self-pay

## 2021-03-25 ENCOUNTER — Encounter: Payer: Self-pay | Admitting: Occupational Therapy

## 2021-03-25 ENCOUNTER — Ambulatory Visit: Payer: Self-pay | Attending: Physician Assistant | Admitting: Speech Pathology

## 2021-03-25 DIAGNOSIS — R278 Other lack of coordination: Secondary | ICD-10-CM | POA: Insufficient documentation

## 2021-03-25 DIAGNOSIS — M6281 Muscle weakness (generalized): Secondary | ICD-10-CM

## 2021-03-25 DIAGNOSIS — R2689 Other abnormalities of gait and mobility: Secondary | ICD-10-CM

## 2021-03-25 DIAGNOSIS — R41841 Cognitive communication deficit: Secondary | ICD-10-CM | POA: Insufficient documentation

## 2021-03-25 DIAGNOSIS — R2681 Unsteadiness on feet: Secondary | ICD-10-CM

## 2021-03-25 DIAGNOSIS — G931 Anoxic brain damage, not elsewhere classified: Secondary | ICD-10-CM | POA: Insufficient documentation

## 2021-03-25 NOTE — Therapy (Signed)
[place] MAIN [facility] SERVICES [address] [place], [place], [address] Phone: [phone]   Fax:  [phone]  Physical Therapy Treatment  Patient Details  Name: [name] [name] MRN: [id number] Date of Birth: 1966-01-04 Referring Provider (PT): D. Silverton- PA-C   Encounter Date: 03/25/2021   PT End of Session - 03/25/21 1450    Visit Number 9    Number of Visits 25    Date for PT Re-Evaluation 05/08/21    Authorization Type 03/24/202- 05/08/2021    PT Start Time 1324    PT Stop Time 1444    PT Time Calculation (min) 46 min    Equipment Utilized During Treatment Gait belt    Activity Tolerance Patient tolerated treatment well    Behavior During Therapy WFL for tasks assessed/performed           Past Medical History:  Diagnosis Date  . Hypertension     Past Surgical History:  Procedure Laterality Date  . PEG PLACEMENT N/A 12/13/2020   Procedure: PERCUTANEOUS ENDOSCOPIC GASTROSTOMY (PEG) PLACEMENT;  Surgeon: Lesly Rubenstein, MD;  Location: [facility] ENDOSCOPY;  Service: Endoscopy;  Laterality: N/A;  . TRACHEOSTOMY TUBE PLACEMENT N/A 12/13/2020   Procedure: TRACHEOSTOMY;  Surgeon: Clyde Canterbury, MD;  Location: [facility] ORS;  Service: ENT;  Laterality: N/A;    There were no vitals filed for this visit.   Subjective Assessment - 03/25/21 1359    Subjective Pt reports no pain and denies falls or near falls. Pt reports she has walked for exercise since last appointment and that she had no issues with her balance. Pt reports she is doing better with navigating steps.    Patient is accompained by: Family member   Husband   Pertinent History Patient experienced a Cardiac arrest on 12/02/2020 with subsequent anoxic brain injury requiring intubation.    Limitations Lifting;Standing;Walking;Writing;House hold activities    How long can you sit comfortably? No issues    How long can you stand comfortably? Limited due to fatigue.    How long can you  walk comfortably? Limited secondary to fatigue.    Patient Stated Goals Improve my balance and mobility back to independent    Currently in Pain? No/denies            NEURO: CGA-min a for all of the following   Ambulating around cones with stepping over hurdle with dual task (counting up, counting down from 100, naming foods) - 1x4, 1x8. Difficulty with dual task. Staggers, slows down, increased variability in BOS, mixes up counting, attempted to step over instead of around cones, knocks over cone. Close CGA-min assist provided throughout. Pt rates exercise as moderate difficulty.    Forward stepping in agility ladder with dual task (naming food, counting up, counting down) - 8x. CGA-min assist. With dual task pt slows down and exhibits increased sway.  Agility ladder backward stepping - 4x, and Agility ladder side-stepping - 6x; frequent cuing for LE positioning (pt difficulty following cues d/t cognition). Pt rates side stepping as more challenging than stepping backward. CGA-min assist.   Ascending/descending stairs - 1x4; initial two reps performed with BUE support, last two reps performed with no UE support. CGA provided throughout. No instances of decreased postural stability or LOB. Pt uses recip. pattern for ascending and descending.   Ambulation outside over grass, uneven surfaces, small curbs, inclines/declines to assess pt dynamic stability in community spaces. Pt with no instances of LOB. She exhibits good scanning of environment and obstacles.  Ambulation with vertical and horizontal head turns 1x100 ft for each. CGA provided. Increase in variability of BOS observed with vertical head turns.    Education provided throughout session via VC/TC and demonstration to facilitate movement at target joints and correct muscle activation for all testing and exercises performed. Pt exhibits fair carryover within session after cuing. She continues to require TC due to cognition.       PT  Short Term Goals - 02/13/21 2239      PT SHORT TERM GOAL #1   Title Pt will be independent with HEP in order to improve strength and balance in order to decrease fall risk and improve function at home and work.    Baseline 02/13/2021- Patient has no formal HEP in place.    Time 6    Period Weeks    Status New    Target Date 03/27/21             PT Long Term Goals - 02/13/21 2237      PT LONG TERM GOAL #1   Title Pt will improve FOTO to target score to 77 to display perceived improvements in ability to complete ADL's.    Baseline 02/13/2021= FOTO score-69    Time 12    Period Weeks    Status New    Target Date 05/08/21      PT LONG TERM GOAL #2   Title Pt will decrease 5TSTS by at least 3 seconds in order to demonstrate clinically significant improvement in LE strength.    Baseline 02/13/2021= 15.0 sec without UE support    Time 12    Period Weeks    Status New    Target Date 05/08/21      PT LONG TERM GOAL #3   Title Patient will increase RLE gross strength to 4+/5 as to improve functional strength for independent gait, increased standing tolerance and increased ADL ability.    Baseline 02/13/2021- Patient presents with 4/5 right Hip flex/knee flex/ext, ankle DF    Time 12    Period Weeks    Status New    Target Date 05/08/21      PT LONG TERM GOAL #4   Title Patient will be modified independent in walking on even/uneven surface with least restrictive assistive device, for 10+ minutes without rest break, reporting some difficulty or less to improve walking tolerance with community ambulation including grocery shopping, going to [place] 02/13/2021- Patient presents with limited ambulation around 120 feet with decreased coordination and fatigue.    Time 12    Period Weeks    Status New    Target Date 05/08/21      PT LONG TERM GOAL #5   Title Patient will increase Functional Gait Assessment score to >15 /30 as to reduce fall risk and improve dynamic gait  safety with community ambulation.    Baseline 02/13/2021: FGA= 8/20    Time 12    Period Weeks    Status New    Target Date 05/08/21                 Plan - 03/25/21 1450    Clinical Impression Statement Pt highly motivated to participate in therapy session. She is still very challeneged with gait with dual task, where she has most difficulty maintaining postural stability and counting backwards. When performing dual task exercises she required up to min assist from PT. Pt ambulated outside to assess ability to navigate curbs, inclines/declines changes  in surface and uneven surfaces. Pt had no instances of LOB. The pt will benefit from further skilled thearpy to improve balance, LE strength and gait to increase safety with all functional mobility.    Personal Factors and Comorbidities Comorbidity 3+    Comorbidities Anoxic brain injury, Sinus tachycardia, Dysphagia    Examination-Activity Limitations Caring for Others;Carry;Lift;Squat;Stairs    Examination-Participation Restrictions Cleaning;Community Activity;Driving;Laundry;Occupation;Personal Finances;Yard Work    Stability/Clinical Decision Making Stable/Uncomplicated    Rehab Potential Good    PT Frequency 2x / week    PT Duration 12 weeks    PT Treatment/Interventions ADLs/Self Care Home Management;Cryotherapy;Moist Heat;DME Instruction;Gait training;Stair training;Functional mobility training;Therapeutic activities;Therapeutic exercise;Balance training;Neuromuscular re-education;Cognitive remediation;Patient/family education    PT Next Visit Plan dual task balance/gait exercises, re-test goals    PT Home Exercise Plan Added standing LE strengthening HEP today and issued handout    Consulted and Agree with Plan of Care Patient;Family member/caregiver    Family Member Consulted pt family member present           Patient will benefit from skilled therapeutic intervention in order to improve the following deficits and  impairments:  Abnormal gait,Decreased activity tolerance,Decreased balance,Decreased cognition,Decreased coordination,Decreased endurance,Decreased mobility,Difficulty walking,Decreased strength  Visit Diagnosis: Unsteadiness on feet  Other abnormalities of gait and mobility  Muscle weakness (generalized)     Problem List Patient Active Problem List   Diagnosis Date Noted  . Dysarthria and anarthria 02/11/2021  . Sinus tachycardia   . Labile blood glucose   . Dysphagia   . Seizure prophylaxis   . Malnutrition of moderate degree 01/23/2021  . Anoxic brain injury ([place]) 01/10/2021  . Prediabetes   . Dyslipidemia   . S/P percutaneous endoscopic gastrostomy (PEG) tube placement ([place])   . Status post tracheostomy ([place])   . Aspiration pneumonia of both lower lobes due to gastric secretions ([place]) 01/05/2021  . Acute respiratory failure ([place])   . Hypoxic ischemic encephalopathy   . Endotracheal tube present   . Cardiac arrest ([place]) 12/02/2020  . Essential hypertension 12/09/2011   Ricard Dillon PT, DPT 03/25/2021, 3:01 PM  [place] MAIN [facility] SERVICES [address] [place], [place], [address] Phone: [phone]   Fax:  [phone]  Name: [name] [name] MRN: [id number] Date of Birth: 01/17/1966

## 2021-03-25 NOTE — Therapy (Signed)
[place] MAIN [facility] SERVICES [address] [place], [place], [address] Phone: [phone]   Fax:  [phone]  Occupational Therapy Treatment  Patient Details  Name: [name] [name] MRN: [id number] Date of Birth: Jul 16, 1966 No data recorded  Encounter Date: 03/25/2021   OT End of Session - 03/25/21 1627    Visit Number 8    Number of Visits 24    Date for OT Re-Evaluation 05/13/21    Authorization Type Progress reports period starting 02/11/2021    OT Start Time 1515    OT Stop Time 1600    OT Time Calculation (min) 45 min    Activity Tolerance Patient tolerated treatment well    Behavior During Therapy [facility] for tasks assessed/performed           Past Medical History:  Diagnosis Date  . Hypertension     Past Surgical History:  Procedure Laterality Date  . PEG PLACEMENT N/A 12/13/2020   Procedure: PERCUTANEOUS ENDOSCOPIC GASTROSTOMY (PEG) PLACEMENT;  Surgeon: Lesly Rubenstein, MD;  Location: [facility] ENDOSCOPY;  Service: Endoscopy;  Laterality: N/A;  . TRACHEOSTOMY TUBE PLACEMENT N/A 12/13/2020   Procedure: TRACHEOSTOMY;  Surgeon: Clyde Canterbury, MD;  Location: [facility] ORS;  Service: ENT;  Laterality: N/A;    There were no vitals filed for this visit.   Subjective Assessment - 03/25/21 1627    Subjective  Pt. reports doing well today.    Patient is accompanied by: Family member    Pertinent History Pt. is a 55 y.o. female who went into cardiac arrest, and was admitted to [facility] on 12/02/2020. Pt. was diagnosed with Anoxic Brain Injury following the Cardiac Arrest. When medically stable, the pt. was tranferred to [place] rehab. The patient was discharged home on 02/08/2021, and is ready to begin outpatient OT services. Pt. was independent, and working prior to the onset. Pt. Has very supportive family. Pt. Enjoys gardening.    Currently in Pain? No/denies          OT TREATMENT    Selfcare:  Pt. Worked on [employer].  completed a 5 min. typing test with 41% accuracy with 1 wpm. 8.  Pt. worked on simple typing words with 30 hits and 4 misses the 1st rep, increasing to 48 hits and 4 misses the  2nd rep.  Pt. worked on typing narrative tasks with 85% accuracy and 3 adjusted wpm.  Pt. reports that she worked on [employer] at home. Pt. Is improving with navigating the computer keyboard, however requires increased time to complete. Education was provided to the pt., and her husband about typing tasks, and exercises at home. Pt. continues to work on typing accuracy, key location, and typing speed in preparation for being able to type an email correspondence.                            OT Education - 03/25/21 1627    Education Details IADLs    Person(s) Educated Patient;Child(ren)    Methods Explanation;Demonstration    Comprehension Verbalized understanding;Returned demonstration               OT Long Term Goals - 02/11/21 1459      OT LONG TERM GOAL #1   Title Pt. will be able to independently sign her name with 100% legibility.    Baseline Eval: Signature not legible, however was able to sign on the line with no deviation above, or  below the line.    Time 12    Period Weeks    Status New    Target Date 05/13/21      OT LONG TERM GOAL #2   Title Pt. will increase right shoulder AROM to be able to independently retrieve items from closets, and cabinets.    Baseline Eval: Pt. presents with limited ROM.    Time 12    Period Weeks    Status New    Target Date 05/13/21      OT LONG TERM GOAL #3   Title Pt. will increase right grip strength by 5# to be able hold a full glass    Baseline Eval: limited grip strength. Limited bilateral digit flexion, unable to make a full composite fist.    Time 12    Period Weeks    Status New    Target Date 05/13/21      OT LONG TERM GOAL #4   Title Pt. will independently access a computer, and type a one sentence email correspondence.     Baseline Eval: Pt. is unable to to perform    Time 12    Period Weeks    Status New    Target Date 05/13/21      OT LONG TERM GOAL #5   Title Pt. will improve bilateral [facility] skills to be able to grasp, and manipulate small objects during ADLs.    Baseline Eval: Pt. presents [facility] skills with difficulty picking,a nd manipulating small objects.    Time 12    Period Weeks    Status New    Target Date 05/13/21      OT LONG TERM GOAL #6   Title Pt. will improve UE strength by 2 mm grades to assist with ADLs, and IADLS,    Baseline Eval: pt. has difficulty performing.    Time 12    Period Weeks    Status New    Target Date 05/13/21      OT LONG TERM GOAL #7   Title Pt. will complete light meal preparation with minA    Baseline Eval: Pt. is unable to prepare light meal preparation.    Time 12    Period Weeks    Status New    Target Date 05/13/21      OT LONG TERM GOAL #8   Title Pt. will perform light home management tasks with minA    Baseline Eval: pt. is unable to perform    Time 12    Period Weeks    Status New    Target Date 05/13/21                 Plan - 03/25/21 1627    Clinical Impression Statement Pt. reports that she worked on typing skills at home. Pt. Is improving with navigating the computer keyboard, however requires increased time to complete. Education was provided to the pt., and her husband about typing tasks, and exercises at home. Pt. continues to work on typing accuracy, key location, and typing speed in preparation for being able to type an email correspondence.   OT Occupational Profile and History Problem Focused Assessment - Including review of records relating to presenting problem    Occupational performance deficits (Please refer to evaluation for details): ADL's;IADL's    Body Structure / Function / Physical Skills ADL;IADL;FMC;ROM;Strength    Rehab Potential Good    Clinical Decision Making Several treatment options, min-mod task  modification necessary  Comorbidities Affecting Occupational Performance: May have comorbidities impacting occupational performance    Modification or Assistance to Complete Evaluation  Min-Moderate modification of tasks or assist with assess necessary to complete eval    OT Frequency 2x / week    OT Duration 12 weeks    OT Treatment/Interventions Self-care/ADL training;Neuromuscular education;Therapeutic activities;Patient/family education;DME and/or AE instruction;Visual/perceptual remediation/compensation    Consulted and Agree with Plan of Care Patient           Patient will benefit from skilled therapeutic intervention in order to improve the following deficits and impairments:   Body Structure / Function / Physical Skills: ADL,IADL,FMC,ROM,Strength       Visit Diagnosis: Muscle weakness (generalized)  Other lack of coordination    Problem List Patient Active Problem List   Diagnosis Date Noted  . Dysarthria and anarthria 02/11/2021  . Sinus tachycardia   . Labile blood glucose   . Dysphagia   . Seizure prophylaxis   . Malnutrition of moderate degree 01/23/2021  . Anoxic brain injury ([place]) 01/10/2021  . Prediabetes   . Dyslipidemia   . S/P percutaneous endoscopic gastrostomy (PEG) tube placement ([place])   . Status post tracheostomy ([place])   . Aspiration pneumonia of both lower lobes due to gastric secretions ([place]) 01/05/2021  . Acute respiratory failure ([place])   . Hypoxic ischemic encephalopathy   . Endotracheal tube present   . Cardiac arrest ([place]) 12/02/2020  . Essential hypertension 12/09/2011    Harrel Carina, MS, OTR/L 03/25/2021, 4:29 PM  [place] MAIN [facility] SERVICES [address] [place], [place], [address] Phone: [phone]   Fax:  [phone]  Name: [name] [name] MRN: [id number] Date of Birth: 13-Apr-1966

## 2021-03-25 NOTE — Patient Instructions (Signed)
Practice speech intelligibility strategies  - SLOP

## 2021-03-26 NOTE — Therapy (Signed)
[place] MAIN [facility] SERVICES [address] [place], [place], [address] Phone: [phone]   Fax:  [phone]  Speech Language Pathology Treatment  Patient Details  Name: [name] [name] MRN: [id number] Date of Birth: [birth year] Referring Provider (SLP): [place], [place]   Encounter Date: 03/25/2021   End of Session - 03/26/21 1639    Visit Number 11    Number of Visits 25    Date for SLP Re-Evaluation 05/06/21    Authorization Type Medicaid    Authorization Time Period 02/11/2021 thru 05/06/2021    Authorization - Visit Number 1    Progress Note Due on Visit 10    SLP Start Time 1310    SLP Stop Time  1400    SLP Time Calculation (min) 50 min    Activity Tolerance Patient tolerated treatment well           Past Medical History:  Diagnosis Date  . Hypertension     Past Surgical History:  Procedure Laterality Date  . PEG PLACEMENT N/A 12/13/2020   Procedure: PERCUTANEOUS ENDOSCOPIC GASTROSTOMY (PEG) PLACEMENT;  Surgeon: Lesly Rubenstein, MD;  Location: [facility] ENDOSCOPY;  Service: Endoscopy;  Laterality: N/A;  . TRACHEOSTOMY TUBE PLACEMENT N/A 12/13/2020   Procedure: TRACHEOSTOMY;  Surgeon: Clyde Canterbury, MD;  Location: [facility] ORS;  Service: ENT;  Laterality: N/A;    There were no vitals filed for this visit.   Subjective Assessment - 03/25/21 1344    Subjective pt pleasant, remembered her glasses and her memory book    Patient is accompained by: Family member    Currently in Pain? No/denies                 ADULT SLP TREATMENT - 03/26/21 0001      Cognitive-Linquistic Treatment   Skilled Treatment Skilled treatment session focused on pt's cognitive communication goals. SLP facilitated session by providing moderate faded to minimal cues to sequence 4 written steps pertaining to an activity to achieve 75% accuracy. SLP facilitated pt's speech intelligibility goals by having pt read each sequence using an "effort level of 8"  to increase her vocal intensity. With minimal cues pt able to achieve ~ 80dB and 75% intelligibility when reading at the sentence level. Initial carryover was noted into conversation but faded as she became engrossed in conversation topic.            SLP Education - 03/25/21 1346    Education Details functional task to complete within ADLs that involve sequencing    Person(s) Educated Patient;Spouse    Methods Explanation;Demonstration;Verbal cues    Comprehension Verbalized understanding;Need further instruction            SLP Short Term Goals - 03/20/21 0941      SLP SHORT TERM GOAL #1   Title Pt will independently demonstrate selective attention in mildly distracting environment for 15 minutes.    Baseline goal met (03/20/2021); upgraded    Time 10    Period --   sessions   Status Revised      SLP SHORT TERM GOAL #2   Title With moderate cues, pt will utilize speech intelligibility strategies to achieve > 90% intelligibility at the phrase level.    Baseline Goal not met in time period. Goal downgraded from minimal cues to moderate cues.    Time 10    Period --   sessions   Status Revised      SLP SHORT TERM GOAL #3   Title  With moderate cues, pt will utilize compensatory memory strategies to recall therapy sessions for week as well as order of therapy in 8 out of 10 sessions.    Baseline goal met (03/20/2021); goal updated    Time 10    Period --   sessions   Status Revised      SLP SHORT TERM GOAL #4   Title With moderate assistance, pt will complete basic problem solving tasks with >95% accuracy.    Baseline Goal met (03/20/2021); goal updated    Time 10    Period --   sessions   Status Revised      SLP SHORT TERM GOAL #5   Title Pt will consume current diet without any reports s/s of dysphagia by pt or family members.    Status Achieved            SLP Long Term Goals - 03/22/21 1447      SLP LONG TERM GOAL #1   Title Pt will consume safest, least  restrictive diet without overt s/s of dysphagia, aspiration or respiratory distress.    Status Achieved      SLP LONG TERM GOAL #2   Title Pt will independently complete functional problem solving for basic and familiar tasks.    Status On-going      SLP LONG TERM GOAL #3   Title Pt will demonstrate selective attention in a moderately distracting environment for 15 minutes with < 1 redirection to task.    Status On-going      SLP LONG TERM GOAL #4   Title With minimal cues, pt will increase speech intelligibility to > 75% at the sentence level.    Status On-going            Plan - 03/26/21 1640    Clinical Impression Statement Pt continues with severe dysarthria c/b slurred speech and decreased vocal intensity that results in < 50% speech intelligibility at the conversation level. While she continues with significant impairments in cognition, she has demonstrated progress in the areas of selective attention, basic recall of family member's names, recall of orientation information as well as oropharyngeal abilities when consuming regular diet with thin liquids. These STGs have been upgraded and are appropriate for next reporting period. Skilled ST remains necessary to target pt's expressive ability, attention, recall, problem solving, awareness as well as executive functions.    Speech Therapy Frequency 2x / week    Duration 12 weeks    Treatment/Interventions Diet toleration management by SLP;Cognitive reorganization;Compensatory techniques;Internal/external aids;SLP instruction and feedback;Patient/family education;Functional tasks;Compensatory strategies    Potential to Achieve Goals Good    Potential Considerations Severity of impairments    SLP Home Exercise Plan provided    Consulted and Agree with Plan of Care Patient;Family member/caregiver    Family Member Consulted pt's husband           Patient will benefit from skilled therapeutic intervention in order to improve the  following deficits and impairments:   Cognitive communication deficit  Anoxic brain injury [facility])    Problem List Patient Active Problem List   Diagnosis Date Noted  . Dysarthria and anarthria 02/11/2021  . Sinus tachycardia   . Labile blood glucose   . Dysphagia   . Seizure prophylaxis   . Malnutrition of moderate degree 01/23/2021  . Anoxic brain injury ([place]) 01/10/2021  . Prediabetes   . Dyslipidemia   . S/P percutaneous endoscopic gastrostomy (PEG) tube placement ([place])   . Status post tracheostomy ([place])   .  Aspiration pneumonia of both lower lobes due to gastric secretions ([place]) 01/05/2021  . Acute respiratory failure ([place])   . Hypoxic ischemic encephalopathy   . Endotracheal tube present   . Cardiac arrest ([place]) 12/02/2020  . Essential hypertension 12/09/2011   Clarion Mooneyhan B. Rutherford Nail M.S., CCC-SLP, [place] Pathologist Rehabilitation Services Office [phone]  Stormy Fabian 03/26/2021, 4:47 PM  [place] MAIN [facility] SERVICES [address] [place], [place], [address] Phone: [phone]   Fax:  [phone]   Name: [name] [name] MRN: [id number] Date of Birth: 06/27/1966

## 2021-03-27 ENCOUNTER — Telehealth: Payer: Self-pay

## 2021-03-27 ENCOUNTER — Inpatient Hospital Stay: Payer: Self-pay

## 2021-03-27 ENCOUNTER — Encounter: Payer: Self-pay | Admitting: Internal Medicine

## 2021-03-27 ENCOUNTER — Other Ambulatory Visit: Payer: Self-pay

## 2021-03-27 ENCOUNTER — Other Ambulatory Visit: Payer: Self-pay | Admitting: Internal Medicine

## 2021-03-27 ENCOUNTER — Inpatient Hospital Stay: Payer: Self-pay | Attending: Internal Medicine | Admitting: Internal Medicine

## 2021-03-27 ENCOUNTER — Ambulatory Visit: Payer: Self-pay | Admitting: Physical Therapy

## 2021-03-27 ENCOUNTER — Encounter: Payer: Self-pay | Admitting: Physical Therapy

## 2021-03-27 VITALS — BP 142/77 | HR 64 | Temp 98.8°F | Resp 16 | Ht 63.0 in | Wt 175.0 lb

## 2021-03-27 DIAGNOSIS — R000 Tachycardia, unspecified: Secondary | ICD-10-CM | POA: Insufficient documentation

## 2021-03-27 DIAGNOSIS — Z79899 Other long term (current) drug therapy: Secondary | ICD-10-CM | POA: Insufficient documentation

## 2021-03-27 DIAGNOSIS — E785 Hyperlipidemia, unspecified: Secondary | ICD-10-CM | POA: Insufficient documentation

## 2021-03-27 DIAGNOSIS — R2689 Other abnormalities of gait and mobility: Secondary | ICD-10-CM

## 2021-03-27 DIAGNOSIS — R278 Other lack of coordination: Secondary | ICD-10-CM

## 2021-03-27 DIAGNOSIS — Z8674 Personal history of sudden cardiac arrest: Secondary | ICD-10-CM | POA: Insufficient documentation

## 2021-03-27 DIAGNOSIS — Z87891 Personal history of nicotine dependence: Secondary | ICD-10-CM | POA: Insufficient documentation

## 2021-03-27 DIAGNOSIS — R918 Other nonspecific abnormal finding of lung field: Secondary | ICD-10-CM | POA: Insufficient documentation

## 2021-03-27 DIAGNOSIS — D649 Anemia, unspecified: Secondary | ICD-10-CM | POA: Insufficient documentation

## 2021-03-27 DIAGNOSIS — M6281 Muscle weakness (generalized): Secondary | ICD-10-CM

## 2021-03-27 DIAGNOSIS — N852 Hypertrophy of uterus: Secondary | ICD-10-CM | POA: Insufficient documentation

## 2021-03-27 DIAGNOSIS — R9389 Abnormal findings on diagnostic imaging of other specified body structures: Secondary | ICD-10-CM | POA: Insufficient documentation

## 2021-03-27 DIAGNOSIS — D259 Leiomyoma of uterus, unspecified: Secondary | ICD-10-CM | POA: Insufficient documentation

## 2021-03-27 DIAGNOSIS — R2681 Unsteadiness on feet: Secondary | ICD-10-CM

## 2021-03-27 DIAGNOSIS — I10 Essential (primary) hypertension: Secondary | ICD-10-CM | POA: Insufficient documentation

## 2021-03-27 NOTE — Assessment & Plan Note (Addendum)
#  Right lower lobe lung nodule approximate 2.5 cm; not significantly PET avid.  Other subcentimeter lung nodules bilaterally.  Discussed benign versus malignant etiology of the lung nodules.  Recommend further evaluation with pulmonary/referral to [facility].  Also will discuss at tumor conference.  #Uterine enlargement-hypermetabolic lesions-again discussed possibility of malignant [especially given lung nodules; small abdominal retroperitoneal hypermetabolic lymph node] versus benign etiologies.  Will need further evaluation with gynecology.  #The above plan of care was discussed with the patient/and husband in detail.  All questions answered.   Thank you Ms.Carrie Mew, NP for allowing me to participate in the care of your pleasant patient. Please do not hesitate to contact me with questions or concerns in the interim.   # DISPOSITION; # referral to Dr.Aleskerov re: lung nodule # [facility]: uterine fibroids # follow up TBD-Dr.B  # I reviewed the blood work- with the patient in detail; also reviewed the imaging independently [as summarized above]; and with the patient in detail.   Cc: Hayley-

## 2021-03-27 NOTE — Telephone Encounter (Signed)
Per [facility] thru secure chat MD only

## 2021-03-27 NOTE — Progress Notes (Signed)
[place] NOTE  Patient Care Team: Marinda Elk, MD as PCP - General (Physician Assistant) Minna Merritts, MD as PCP - Cardiology (Cardiology)  CHIEF COMPLAINTS/PURPOSE OF CONSULTATION: LUNG NODULE  #April 2022- ~2.5 cm lobulated lobe lung nodule [incidental on coronary CTA]; April 2022 PET scan-no significant uptake; however uterine enlargement-uptake fibroid versus malignancy  #Uterine enlargement/fibroid versus malignancy  # JAN 2022-cardiac arrest s/p resuscitation; anoxic brain injury-PEG/trach; rehab currently on; trach/PEG-explanted. Oncology History   No history exists.     HISTORY OF PRESENTING ILLNESS:  [name] [name] 55 y.o.  female history of smoking is here for further evaluation and recommendations for lung mass/nodule.  Patient had a complicated course post cardiac arrest [noted to be in V. Fib] s/p ACLS-defibrillation/intubation.  Complicated by aspiration pneumonia from hospitalization needing tracheostomy/PEG tube placement.  Patient after lengthy hospital stay was discharged to rehab where she subsequently recovered.  She is currently home.  She is almost close to her baseline health/without any major residual neurologic deficits.  Patient had had a coronary CT-incidentally picked up that right lower lobe nodule/mentioned above.  This was followed by a PET scan-no significant uptake in the right lower lobe lung nodule; however noted to have uterine mass/uptake.   Review of Systems  Constitutional: Positive for malaise/fatigue. Negative for chills, diaphoresis, fever and weight loss.  HENT: Negative for nosebleeds and sore throat.   Eyes: Negative for double vision.  Respiratory: Negative for cough, hemoptysis, sputum production, shortness of breath and wheezing.   Cardiovascular: Negative for chest pain, palpitations, orthopnea and leg swelling.  Gastrointestinal: Negative for abdominal pain, blood in stool, constipation,  diarrhea, heartburn, melena, nausea and vomiting.  Genitourinary: Negative for dysuria, frequency and urgency.  Musculoskeletal: Positive for back pain and joint pain.  Skin: Negative.  Negative for itching and rash.  Neurological: Negative for dizziness, tingling, focal weakness, weakness and headaches.  Endo/Heme/Allergies: Does not bruise/bleed easily.  Psychiatric/Behavioral: Negative for depression. The patient is not nervous/anxious and does not have insomnia.      MEDICAL HISTORY:  Past Medical History:  Diagnosis Date  . Hypertension   . Seizures ([place])     SURGICAL HISTORY: Past Surgical History:  Procedure Laterality Date  . PEG PLACEMENT N/A 12/13/2020   Procedure: PERCUTANEOUS ENDOSCOPIC GASTROSTOMY (PEG) PLACEMENT;  Surgeon: Lesly Rubenstein, MD;  Location: [facility] ENDOSCOPY;  Service: Endoscopy;  Laterality: N/A;  . TRACHEOSTOMY TUBE PLACEMENT N/A 12/13/2020   Procedure: TRACHEOSTOMY;  Surgeon: Clyde Canterbury, MD;  Location: [facility] ORS;  Service: ENT;  Laterality: N/A;    SOCIAL HISTORY: Social History   Socioeconomic History  . Marital status: Married    Spouse name: Not on file  . Number of children: Not on file  . Years of education: Not on file  . Highest education level: Not on file  Occupational History  . Not on file  Tobacco Use  . Smoking status: Former Smoker    Quit date: 2007    Years since quitting: 15.3  . Smokeless tobacco: Never Used  Vaping Use  . Vaping Use: Never used  Substance and Sexual Activity  . Alcohol use: No  . Drug use: Never  . Sexual activity: Not on file  Other Topics Concern  . 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    Social Determinants of Health   Financial Resource Strain: Not on file  Food Insecurity: Not on file  Transportation Needs: Not on file  Physical Activity: Not on file  Stress: Not on file  Social  Connections: Not on file  Intimate Partner Violence: Not on file    FAMILY HISTORY: Family History  Problem Relation Age of Onset  . Hypertension Mother   . Leukemia Mother   . Diabetes Mother   . Healthy Father     ALLERGIES:  has No Known Allergies.  MEDICATIONS:  Current Outpatient Medications  Medication Sig Dispense Refill  . amLODipine (NORVASC) 10 MG tablet Take 1 tablet (10 mg total) by mouth daily. 30 tablet 5  . atorvastatin (LIPITOR) 80 MG tablet Take 1 tablet (80 mg total) by mouth daily. 30 tablet 5  . levETIRAcetam (KEPPRA) 750 MG tablet Take 1 tablet (750 mg total) by mouth 2 (two) times daily. 60 tablet 0  . levETIRAcetam (KEPPRA) 750 MG tablet TAKE 1 TABLET (750 MG TOTAL) BY MOUTH TWO TIMES DAILY. 60 tablet 0  . methylphenidate (RITALIN) 5 MG tablet TAKE 1 TABLET (5 MG TOTAL) BY MOUTH TWO TIMES DAILY WITH BREAKFAST AND LUNCH. 60 tablet 0  . metoprolol tartrate (LOPRESSOR) 25 MG tablet Take 1 tablet (25 mg total) by mouth 2 (two) times daily. 60 tablet 5  . potassium chloride SA (KLOR-CON M20) 20 MEQ tablet Take 2 tablets (40 mEq total) by mouth daily. 60 tablet 5  . saccharomyces boulardii (FLORASTOR) 250 MG capsule Take 1 capsule (250 mg total) by mouth 2 (two) times daily. 60 capsule 0   No current facility-administered medications for this visit.      Marland Kitchen  PHYSICAL EXAMINATION: ECOG PERFORMANCE STATUS: 0 - Asymptomatic  Vitals:   03/27/21 1103  BP: (!) 142/77  Pulse: 64  Resp: 16  Temp: 98.8 F (37.1 C)  SpO2: 100%   Filed Weights   03/27/21 1103  Weight: 175 lb (79.4 kg)    Physical Exam Constitutional:      Comments: Patient is ambulating independently.  Accompanied by husband.  HENT:     Head: Normocephalic and atraumatic.     Mouth/Throat:     Pharynx: No oropharyngeal exudate.  Eyes:     Pupils: Pupils are equal, round, and reactive to light.  Cardiovascular:     Rate and Rhythm: Normal rate and regular rhythm.  Pulmonary:      Effort: Pulmonary effort is normal. No respiratory distress.     Breath sounds: Normal breath sounds. No wheezing.  Abdominal:     General: Bowel sounds are normal. There is no distension.     Palpations: Abdomen is soft. There is no mass.     Tenderness: There is no abdominal tenderness. There is no guarding or rebound.  Musculoskeletal:        General: No tenderness. Normal range of motion.     Cervical back: Normal range of motion and neck supple.  Skin:    General: Skin is warm.  Neurological:     Mental Status: She is alert and oriented to person, place, and time.  Psychiatric:        Mood and Affect: Affect normal.      LABORATORY DATA:  I have reviewed the data as listed Lab Results  Component Value Date   WBC 5.8 02/03/2021   HGB 10.7 (L) 02/03/2021   HCT 33.5 (L) 02/03/2021   MCV 89.6 02/03/2021   PLT 181 02/03/2021   Recent Labs    12/04/20 0435 12/05/20 0436 01/04/21 0421 01/05/21 0440  01/06/21 0404 01/13/21 0537 01/27/21 0450 02/18/21 0932 02/26/21 1409 02/28/21 0946  NA 141   < > 149* 143   < > 144 139 143 140 141  K 3.4*   < > 3.8 3.5   < > 3.6 3.6 3.4* 2.8* 3.5  CL 108   < > 106 104   < > 106 104 104 103 107  CO2 26   < > 30 29   < > 27 24 24 28 26   GLUCOSE 127*   < > 116* 145*   < > 187* 157* 113* 101* 111*  BUN 24*   < > 19 15   < > 12 10 9 10 8   CREATININE 0.91   < > 0.61 0.58   < > 0.55 0.48 0.55* 0.61 0.49  CALCIUM 8.2*   < > 9.6 9.3   < > 9.6 8.7* 9.0 8.7* 9.2  GFRNONAA >60   < > >60 >60   < > >60 >60  --  >60 >60  PROT 7.1   < > 7.8 7.7  --  7.8  --   --   --   --   ALBUMIN 2.9*   < > 3.0* 3.1*  --  3.0*  --   --   --   --   AST 72*   < > 23 27  --  22  --   --   --   --   ALT 89*   < > 16 16  --  18  --   --   --   --   ALKPHOS 59   < > 75 70  --  77  --   --   --   --   BILITOT 0.5   < > 0.3 0.5  --  0.3  --   --   --   --   BILIDIR <0.1  --   --   --   --   --   --   --   --   --   IBILI NOT CALCULATED  --   --   --   --   --   --   --    --   --    < > = values in this interval not displayed.    RADIOGRAPHIC STUDIES: I have personally reviewed the radiological images as listed and agreed with the findings in the report. NM PET Image Initial (PI) Skull Base To Thigh  Result Date: 03/20/2021 CLINICAL DATA:  Initial treatment strategy for right lower lobe lung nodule on cardiac CTA. EXAM: NUCLEAR MEDICINE PET SKULL BASE TO THIGH TECHNIQUE: 9.1 mCi F-18 FDG was injected intravenously. Full-ring PET imaging was performed from the skull base to thigh after the radiotracer. CT data was obtained and used for attenuation correction and anatomic localization. Fasting blood glucose: 86 mg/dl COMPARISON:  Coronary CTA 02/27/2021. Chest radiograph 01/05/2021 reviewed. FINDINGS: Mediastinal blood pool activity: SUV max 1.2 Liver activity: SUV max NA NECK: No areas of abnormal hypermetabolism. Incidental CT findings: No cervical adenopathy. Minimal mucosal thickening of bilateral maxillary sinuses. CHEST: Low-level right hilar and infrahilar nodal hypermetabolism without adenopathy is favored to be reactive. The right infrahilar hypermetabolism measures a S.U.V. max of 2.3 including on image 91/3. The right lower lobe lung nodule measures 1.9 by 1.9 cm and is not significantly hypermetabolic, including at a S.U.V. max of 1.5. This measured 2.2 x 1.9 cm on the prior chest CT.  A dominant posterior left upper lobe pulmonary nodule measures 1.1 cm and a S.U.V. max of 1.1 on 67/3. Incidental CT findings: Smaller bilateral pulmonary nodules as on prior chest CT. ABDOMEN/PELVIS: An aortocaval node measures 5 mm a S.U.V. max of 3.3 on 156/3. Left adrenal hypermetabolism laterally corresponds to low-density nodularity, favoring an adenoma. Example 1.3 cm and s.U.V. Max of 3.4. Uterine enlargement and heterogeneity with 2 hypermetabolic lesions within. Example at ar S.U.V. max of 15.4 within the right-side of the uterine body/fundus. Incidental CT findings: No  renal calculi or hydronephrosis. Aortic atherosclerosis. Mild pelvic floor laxity. SKELETON: Likely degenerative hypermetabolism about both shoulders. There is also hypermetabolism about the caudal portion of the medial right clavicle, in the region of the anterior first right rib. No CT correlate. Example at a S.U.V. max of 2.9. Incidental CT findings: none IMPRESSION: 1. Bilateral pulmonary nodules, including the dominant right lower lobe pulmonary nodule, which are not significantly FDG avid. Considerations include non FDG avid metastasis or infectious/inflammatory process. Potential clinical strategies include tissue sampling versus 3 month follow-up with CT. 2. Uterine enlargement heterogeneity, with hypermetabolic lesions within. Most likely fibroids. Given the pulmonary nodules, leiomyosarcoma(s) or metastasizing benign leiomyomas are considerations. 3. Small abdominal retroperitoneal hypermetabolic node. Most likely benign/reactive. If the patient is diagnosed with metastatic disease, nodal metastasis would be considered. 4. Right chest wall hypermetabolism adjacent the caudal portion of the medial clavicle. Most likely degenerative. The clinical history on cardiac CTA describes cardiac arrest. CT occult injury from CPR could look similar. Electronically Signed   By: Abigail Miyamoto M.D.   On: 03/20/2021 10:06   CT CORONARY MORPH W/CTA COR W/SCORE W/CA W/CM &/OR WO/CM  Addendum Date: 02/27/2021   ADDENDUM REPORT: 02/27/2021 16:50 EXAM: OVER-READ INTERPRETATION  CT CHEST The following report is an over-read performed by radiologist Dr. Eben Burow [facility] on 02/27/2021. This over-read does not include interpretation of cardiac or coronary anatomy or pathology. The coronary calcium score/coronary CTA interpretation by the cardiologist is attached. COMPARISON:  None. FINDINGS: A lobulated solid nodule is seen in the posterior right lower lobe which measures 2.2 x 1.9 cm. Multiple other sub-cm  pulmonary nodules are seen in the visualized portions of both lower lungs. No evidence of pleural effusion. The visualized portions of the mediastinum and chest wall are unremarkable. IMPRESSION: Dominant 2.2 cm pulmonary nodule in the posterior right lower lobe, with multiple other sub-cm pulmonary nodules bilaterally. Malignancy cannot be excluded. PET-CT scan is recommended for further evaluation. These results will be called to the ordering clinician or representative by the Radiologist Assistant, and communication documented in the PACS or [employer]. Electronically Signed   By: Marlaine Hind M.D.   On: 02/27/2021 16:50   Result Date: 02/27/2021 CLINICAL DATA:  Cardiac arrest EXAM: Cardiac/Coronary  CTA TECHNIQUE: The patient was scanned on a Siemens Somatoform go.Top scanner. FINDINGS: A retrospective scan was triggered in the descending thoracic aorta. Axial non-contrast 3 mm slices were carried out through the heart. The data set was analyzed on a dedicated work station and scored using the [place]. Gantry rotation speed was 330 msecs and collimation was .6 mm. 100mg  of metoprolol and 0.8 mg of sl NTG was given. The 3D data set was reconstructed in 5% intervals of the 60-95 % of the R-R cycle. Diastolic phases were analyzed on a dedicated work station using MPR, MIP and VRT modes. The patient received 75 cc of contrast. Aorta:  Normal size.  No calcifications.  No  dissection. Aortic Valve:  Trileaflet.  No calcifications. Coronary Arteries:  Normal coronary origin.  Right dominance. RCA is a dominant artery that gives rise to PDA and PLA. There is no plaque. Left main is a large artery that gives rise to LAD and LCX arteries. LAD has no plaque. LCX is a non-dominant artery that gives rise to two obtuse marginal branches. There is no plaque. Other findings: Normal pulmonary vein drainage into the left atrium. Normal left atrial appendage without a thrombus. Normal size of the pulmonary artery.  IMPRESSION: 1. Normal coronary calcium score of 0. Patient is low risk for coronary events. 2. Normal coronary origin with right dominance. 3. No evidence of CAD. 4. CAD-RADS 0. Consider non-atherosclerotic causes of cardiac arrest. Electronically Signed: By: Kate Sable M.D. On: 02/27/2021 15:54    ASSESSMENT & PLAN:   Multiple lung nodules #Right lower lobe lung nodule approximate 2.5 cm; not significantly PET avid.  Other subcentimeter lung nodules bilaterally.  Discussed benign versus malignant etiology of the lung nodules.  Recommend further evaluation with pulmonary/referral to [facility].  Also will discuss at tumor conference.  #Uterine enlargement-hypermetabolic lesions-again discussed possibility of malignant [especially given lung nodules; small abdominal retroperitoneal hypermetabolic lymph node] versus benign etiologies.  Will need further evaluation with gynecology.  #The above plan of care was discussed with the patient/and husband in detail.  All questions answered.   Thank you Ms.Carrie Mew, NP for allowing me to participate in the care of your pleasant patient. Please do not hesitate to contact me with questions or concerns in the interim.   # DISPOSITION; # referral to Dr.Aleskerov re: lung nodule # [facility]: uterine fibroids # follow up TBD-Dr.B  # I reviewed the blood work- with the patient in detail; also reviewed the imaging independently [as summarized above]; and with the patient in detail.   Cc: Hayley-   All questions were answered. The patient knows to call the clinic with any problems, questions or concerns.    Cammie Sickle, MD 03/27/2021 3:17 PM

## 2021-03-27 NOTE — Telephone Encounter (Signed)
Referral changed from gyn to gyn oncology. Called and spoke with spouse, Darryl. Appointment arranged for 04/02/2021 at 0830. Called and cancelled referral to [facility].

## 2021-03-27 NOTE — Therapy (Signed)
[place] MAIN [facility] SERVICES [address] [place], [place], [address] Phone: [phone]   Fax:  [phone]  Physical Therapy Treatment Physical Therapy Progress Note/Discharge Summary   Dates of reporting period 02/13/21 to  03/27/21   Patient Details  Name: [name] [name] MRN: [id number] Date of Birth: [birth year] Referring Provider (PT): D. La Parguera- PA-C   Encounter Date: 03/27/2021   PT End of Session - 03/27/21 1355    Visit Number 10    Number of Visits 25    Date for PT Re-Evaluation 05/08/21    Authorization Type 03/24/202- 05/08/2021    PT Start Time 3009    PT Stop Time 1430    PT Time Calculation (min) 42 min    Equipment Utilized During Treatment Gait belt    Activity Tolerance Patient tolerated treatment well    Behavior During Therapy WFL for tasks assessed/performed           Past Medical History:  Diagnosis Date  . Hypertension   . Seizures ([place])     Past Surgical History:  Procedure Laterality Date  . PEG PLACEMENT N/A 12/13/2020   Procedure: PERCUTANEOUS ENDOSCOPIC GASTROSTOMY (PEG) PLACEMENT;  Surgeon: Lesly Rubenstein, MD;  Location: [facility] ENDOSCOPY;  Service: Endoscopy;  Laterality: N/A;  . TRACHEOSTOMY TUBE PLACEMENT N/A 12/13/2020   Procedure: TRACHEOSTOMY;  Surgeon: Clyde Canterbury, MD;  Location: [facility] ORS;  Service: ENT;  Laterality: N/A;    There were no vitals filed for this visit.   Subjective Assessment - 03/27/21 1354    Subjective Pt presents to therapy in flip flops stating that she has a sore spot on the back of right heel that she thinks happened from her tennis shoes. She states that it started a few days ago;    Patient is accompained by: Family member   Husband   Pertinent History Patient experienced a Cardiac arrest on 12/02/2020 with subsequent anoxic brain injury requiring intubation.    Limitations Lifting;Standing;Walking;Writing;House hold activities    How long can you sit  comfortably? No issues    How long can you stand comfortably? Limited due to fatigue.    How long can you walk comfortably? Limited secondary to fatigue.    Patient Stated Goals Improve my balance and mobility back to independent    Currently in Pain? No/denies              [facility] PT Assessment - 03/27/21 0001      Functional Gait  Assessment   Gait assessed  Yes    Gait Level Surface Walks 20 ft in less than 5.5 sec, no assistive devices, good speed, no evidence for imbalance, normal gait pattern, deviates no more than 6 in outside of the 12 in walkway width.    Change in Gait Speed Able to smoothly change walking speed without loss of balance or gait deviation. Deviate no more than 6 in outside of the 12 in walkway width.    Gait with Horizontal Head Turns Performs head turns smoothly with no change in gait. Deviates no more than 6 in outside 12 in walkway width    Gait with Vertical Head Turns Performs head turns with no change in gait. Deviates no more than 6 in outside 12 in walkway width.    Gait and Pivot Turn Pivot turns safely in greater than 3 sec and stops with no loss of balance, or pivot turns safely within 3 sec and stops with mild imbalance, requires small steps  to catch balance.    Step Over Obstacle Is able to step over one shoe box (4.5 in total height) without changing gait speed. No evidence of imbalance.    Gait with Narrow Base of Support Ambulates less than 4 steps heel to toe or cannot perform without assistance.    Gait with Eyes Closed Walks 20 ft, uses assistive device, slower speed, mild gait deviations, deviates 6-10 in outside 12 in walkway width. Ambulates 20 ft in less than 9 sec but greater than 7 sec.    Ambulating Backwards Walks 20 ft, uses assistive device, slower speed, mild gait deviations, deviates 6-10 in outside 12 in walkway width.    Steps Alternating feet, no rail.    Total Score 23         TREATMENT: Patient instructed in outcome measures to  address goals, see below:      FUNCTIONAL OUTCOME MEASURES    Outcome measure:  Results 3/24  Results 5/5 Comments  FGA 8/30 23/30 <19 indicates high fall risk  TUG 9.85 seconds 7.2 sec No device  5TSTS  15 seconds 8.39 sec No UE support (<15 sec indicates low fall risk)  6 Minute Walk Test     10 Meter Gait Speed Self-selected: s = 0.9 m/s 1.09 m/s (self selected speed) Below normative values for full community ambulation                Patient tolerated session well. She exhibits significant improvement in balance and gait.  Patient's condition has the potential to improve in response to therapy. Maximum improvement is yet to be obtained. The anticipated improvement is attainable and reasonable in a generally predictable time.  Patient reports adherence with HEP stating that she walks 3-4 times a day for up to 1.5 miles                  PT Education - 03/27/21 1355    Education Details exercise technique/balance/dual task;    Person(s) Educated Patient    Methods Explanation;Verbal cues    Comprehension Verbalized understanding;Returned demonstration;Verbal cues required;Need further instruction            PT Short Term Goals - 03/27/21 1355      PT SHORT TERM GOAL #1   Title Pt will be independent with HEP in order to improve strength and balance in order to decrease fall risk and improve function at home and work.    Baseline 02/13/2021- Patient has no formal HEP in place. 5/5: walking every day 3-4x a day;    Time 6    Period Weeks    Status Achieved    Target Date 03/27/21             PT Long Term Goals - 03/27/21 1356      PT LONG TERM GOAL #1   Title Pt will improve FOTO to target score to 77 to display perceived improvements in ability to complete ADL's.    Baseline 02/13/2021= FOTO score-69, 5/5: 93%    Time 12    Period Weeks    Status Achieved    Target Date 05/08/21      PT LONG TERM GOAL #2   Title Pt will decrease  5TSTS by at least 3 seconds in order to demonstrate clinically significant improvement in LE strength.    Baseline 02/13/2021= 15.0 sec without UE support, 5/5: 8.39 sec    Time 12    Period Weeks    Status Achieved  Target Date 05/08/21      PT LONG TERM GOAL #3   Title Patient will increase RLE gross strength to 4+/5 as to improve functional strength for independent gait, increased standing tolerance and increased ADL ability.    Baseline 02/13/2021- Patient presents with 4/5 right Hip flex/knee flex/ext, ankle DF, 5/5: 5/5 in RLE    Time 12    Period Weeks    Status Achieved    Target Date 05/08/21      PT LONG TERM GOAL #4   Title Patient will be modified independent in walking on even/uneven surface with least restrictive assistive device, for 10+ minutes without rest break, reporting some difficulty or less to improve walking tolerance with community ambulation including grocery shopping, going to [place] 02/13/2021- Patient presents with limited ambulation around 120 feet with decreased coordination and fatigue. 5/5: 15 min+    Time 12    Period Weeks    Status Achieved    Target Date 05/08/21      PT LONG TERM GOAL #5   Title Patient will increase Functional Gait Assessment score to >15 /30 as to reduce fall risk and improve dynamic gait safety with community ambulation.    Baseline 02/13/2021: FGA= 8/20, 5/5: 23/30    Time 12    Period Weeks    Status Achieved    Target Date 05/08/21                 Plan - 03/27/21 1428    Clinical Impression Statement Patient motivated and participated well within session. Patient instructed in outcome measures to address progress towards goals. She tested as a low fall risk and exhibits significant improvement in LE strength and mobility. Patient has met all goals. She expressed desire to stop therapy at this time and will pursue gym membership for continued fitness. Patient has been walking regularly and exhibits  good motivation for continued fitness. Will discharge from PT at this time.    Personal Factors and Comorbidities Comorbidity 3+    Comorbidities Anoxic brain injury, Sinus tachycardia, Dysphagia    Examination-Activity Limitations Caring for Others;Carry;Lift;Squat;Stairs    Examination-Participation Restrictions Cleaning;Community Activity;Driving;Laundry;Occupation;Personal Finances;Yard Work    Stability/Clinical Decision Making Stable/Uncomplicated    Rehab Potential Good    PT Frequency 2x / week    PT Duration 12 weeks    PT Treatment/Interventions ADLs/Self Care Home Management;Cryotherapy;Moist Heat;DME Instruction;Gait training;Stair training;Functional mobility training;Therapeutic activities;Therapeutic exercise;Balance training;Neuromuscular re-education;Cognitive remediation;Patient/family education    PT Next Visit Plan dual task balance/gait exercises, re-test goals    PT Home Exercise Plan Added standing LE strengthening HEP today and issued handout    Consulted and Agree with Plan of Care Patient;Family member/caregiver    Family Member Consulted pt family member present           Patient will benefit from skilled therapeutic intervention in order to improve the following deficits and impairments:  Abnormal gait,Decreased activity tolerance,Decreased balance,Decreased cognition,Decreased coordination,Decreased endurance,Decreased mobility,Difficulty walking,Decreased strength  Visit Diagnosis: Muscle weakness (generalized)  Other lack of coordination  Unsteadiness on feet  Other abnormalities of gait and mobility     Problem List Patient Active Problem List   Diagnosis Date Noted  . Multiple lung nodules 03/27/2021  . Dysarthria and anarthria 02/11/2021  . Sinus tachycardia   . Labile blood glucose   . Dysphagia   . Seizure prophylaxis   . Malnutrition of moderate degree 01/23/2021  . Anoxic brain injury ([place]) 01/10/2021  . Prediabetes   .  Dyslipidemia    . S/P percutaneous endoscopic gastrostomy (PEG) tube placement ([place])   . Status post tracheostomy ([place])   . Aspiration pneumonia of both lower lobes due to gastric secretions ([place]) 01/05/2021  . Acute respiratory failure ([place])   . Hypoxic ischemic encephalopathy   . Endotracheal tube present   . Cardiac arrest ([place]) 12/02/2020  . Essential hypertension 12/09/2011    Venessa Wickham PT, DPT 03/27/2021, 2:31 PM  [place] MAIN [facility] SERVICES [address] [place], [place], [address] Phone: [phone]   Fax:  [phone]  Name: [name] [name] MRN: [id number] Date of Birth: [birth year]

## 2021-03-27 NOTE — Addendum Note (Signed)
Addended by: Delice Bison E on: 03/27/2021 03:43 PM   Modules accepted: Orders

## 2021-03-27 NOTE — Telephone Encounter (Signed)
Cancer center at [facility] referring for Uterine leiomyoma, unspecified location. Called and left voicemail for patient to call back to be scheduled.

## 2021-04-01 ENCOUNTER — Ambulatory Visit: Payer: Self-pay

## 2021-04-02 ENCOUNTER — Inpatient Hospital Stay (HOSPITAL_BASED_OUTPATIENT_CLINIC_OR_DEPARTMENT_OTHER): Payer: Self-pay | Admitting: Obstetrics and Gynecology

## 2021-04-02 ENCOUNTER — Encounter: Payer: Self-pay | Admitting: Physical Medicine & Rehabilitation

## 2021-04-02 ENCOUNTER — Other Ambulatory Visit: Payer: Self-pay

## 2021-04-02 ENCOUNTER — Encounter: Payer: Self-pay | Attending: Registered Nurse | Admitting: Physical Medicine & Rehabilitation

## 2021-04-02 ENCOUNTER — Encounter: Payer: Self-pay | Admitting: Obstetrics and Gynecology

## 2021-04-02 ENCOUNTER — Inpatient Hospital Stay: Payer: Self-pay

## 2021-04-02 VITALS — BP 153/96 | HR 72 | Temp 98.2°F | Ht 63.0 in | Wt 182.0 lb

## 2021-04-02 VITALS — BP 151/88 | HR 73 | Temp 98.7°F | Resp 20 | Wt 181.0 lb

## 2021-04-02 DIAGNOSIS — R918 Other nonspecific abnormal finding of lung field: Secondary | ICD-10-CM

## 2021-04-02 DIAGNOSIS — D259 Leiomyoma of uterus, unspecified: Secondary | ICD-10-CM

## 2021-04-02 DIAGNOSIS — G931 Anoxic brain damage, not elsewhere classified: Secondary | ICD-10-CM

## 2021-04-02 NOTE — Progress Notes (Signed)
Subjective:    Patient ID: [name] [name], female    DOB: 11/19/1966, 55 y.o.   MRN: [id number]  HPI [name] [name] is here in follow up  Of her anoxic brain injury. She is doing well. Her appetite is great! She's been sleeping well and mood has been positive. Bowels and blader have been normal. She denies any falls or mishaps. She is involved in OT and SLP at [facility]. She has graduated from PT.   Her swallowing has been normal  She denies seizures. Her bp has been controlled at home. She's eating well, swallowing is normal.   Husband and family remain very supportive.   Pain Inventory Average Pain no pain Pain Right Now 0 My pain is no pain  In the last 24 hours, has pain interfered with the following? General activity 0 Relation with others 0 Enjoyment of life 0 What TIME of day is your pain at its worst? No pain  Sleep (in general) Good  Pain is worse with: no pain Pain improves with: no pain Relief from Meds: no pain  Family History  Problem Relation Age of Onset  . Hypertension Mother   . Leukemia Mother   . Diabetes Mother   . Healthy Father    Social History   Socioeconomic History  . Marital status: Married    Spouse name: Not on file  . Number of children: Not on file  . Years of education: Not on file  . Highest education level: Not on file  Occupational History  . Not on file  Tobacco Use  . Smoking status: Former Smoker    Quit date: 2007    Years since quitting: 15.3  . Smokeless tobacco: Never Used  Vaping Use  . Vaping Use: Never used  Substance and Sexual Activity  . Alcohol use: No  . Drug use: Never  . Sexual activity: Not on file  Other Topics Concern  . 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    Social Determinants of Health   Financial Resource Strain: Not on file  Food Insecurity: Not on file  Transportation Needs: Not on  file  Physical Activity: Not on file  Stress: Not on file  Social Connections: Not on file   Past Surgical History:  Procedure Laterality Date  . PEG PLACEMENT N/A 12/13/2020   Procedure: PERCUTANEOUS ENDOSCOPIC GASTROSTOMY (PEG) PLACEMENT;  Surgeon: Lesly Rubenstein, MD;  Location: [facility] ENDOSCOPY;  Service: Endoscopy;  Laterality: N/A;  . TRACHEOSTOMY TUBE PLACEMENT N/A 12/13/2020   Procedure: TRACHEOSTOMY;  Surgeon: Clyde Canterbury, MD;  Location: [facility] ORS;  Service: ENT;  Laterality: N/A;   Past Surgical History:  Procedure Laterality Date  . PEG PLACEMENT N/A 12/13/2020   Procedure: PERCUTANEOUS ENDOSCOPIC GASTROSTOMY (PEG) PLACEMENT;  Surgeon: Lesly Rubenstein, MD;  Location: [facility] ENDOSCOPY;  Service: Endoscopy;  Laterality: N/A;  . TRACHEOSTOMY TUBE PLACEMENT N/A 12/13/2020   Procedure: TRACHEOSTOMY;  Surgeon: Clyde Canterbury, MD;  Location: [facility] ORS;  Service: ENT;  Laterality: N/A;   Past Medical History:  Diagnosis Date  . Hypertension   . Seizures ([facility])    BP (!) 153/96   Pulse 72   Temp 98.2 F (36.8 C)   Ht 5\' 3"  (1.6 m)   Wt 182 lb (82.6 kg)   SpO2 97%   BMI 32.24 kg/m   Opioid Risk Score:   Fall Risk Score:  `  1  Depression screen PHQ 2/9  Depression screen PHQ 2/9 02/21/2021  Decreased Interest 0  Down, Depressed, Hopeless 0  PHQ - 2 Score 0  Altered sleeping 0  Tired, decreased energy 0  Change in appetite 0  Feeling bad or failure about yourself  0  Trouble concentrating 0  Moving slowly or fidgety/restless 0  Suicidal thoughts 0  PHQ-9 Score 0    Review of Systems  Constitutional: Negative.   HENT: Negative.   Eyes: Negative.   Respiratory: Negative.   Cardiovascular: Negative.   Gastrointestinal: Negative.   Endocrine: Negative.   Genitourinary: Negative.   Musculoskeletal: Negative.   Skin: Negative.   Allergic/Immunologic: Negative.   Neurological: Negative.   Hematological: Negative.   Psychiatric/Behavioral: Negative.   All other  systems reviewed and are negative.      Objective:   Physical Exam  Gen: no distress, normal appearing HEENT: oral mucosa pink and moist, NCAT Cardio: Reg rate Chest: normal effort, normal rate of breathing Abd: soft, non-distended Ext: no edema Psych: pleasant, normal affect Skin: intact Neuro: alert and oriented. Speech dysarthric but intelligible. New month, year, missed day by one. reaonsable insight and awareness.  Strength 5/5. Balance nearly normal. Negative romber.  Musculoskeletal: normal rom      Assessment & Plan:   1.  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 fib arrest/acute respiratory failure             continue with outpt OT and SLP  -she has made great gains!  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  Pain Management:              -tylenol prn 3. Mood:improved.   -supportive family! 4. Neuropsych: DC RITALIN. Taper was provided 5. Dysphagia resolved 6. Seizure prophylaxis- keppra no longer needed--dc                           7.  Hypertension.  Continue Norvasc 10 mg daily, Lopressor 25 mg twice daily.    Fifteen minutes of face to face patient care time were spent during this visit. All questions were encouraged and answered.  Follow up with me in 3 mos .

## 2021-04-02 NOTE — Progress Notes (Signed)
Gynecologic Oncology Consult Visit   Referring Provider: Dr. Rogue Bussing  Chief Concern: abnormal uterine findings on PET scan  Subjective:  [name] [name] is a 55 y.o. P2 (1 SVD; 1 C/S) female with long history of leiomyoma and cardiac arrest who is seen in consultation from Dr. Rogue Bussing for incidental abnormal uterine findings on PET scan.  She has a long history of leiomyoma dating all the way back to 2009. She has not been symptomatic. Her cycles are regular x 5-7 days with normal flow, occurring monthly with menarche at age 37. She missed her cycle for ~3 months which she was in the ICU.  Her last Pap is unknown. She has not had a pelvic exam in ~4-5 years per her report.   February 15, 2012 Last Pelvic US Findings:  The uterus is anteverted. It measures 8.9 x 12.0 x 7.3 cm  transabdominally.  #1: In the anterior left lateral fundus there is an isoechoic subserosal  fibroid labeled 1, which measures 3.0 x 2.4 x 2.9 cm (previously 2.2 x  1.9 x 2.3 cm).  #2: Posterior to structure 1 in the mid-uterine fundus is a hypoechoic  subserosal fibroid labeled 2, which measures 2.5 x 1.7 x 2.7 cm (previously 2.5 x 2.2 x 2.7 cm).  #3: In the posterior left uterus adjacent to endometrium with likely  submucosal component there is an isoechoic fibroid, which measures 2.6 x  2.2 x 2.2 cm (does not correspond to #3 on prior).  #4: No fibroid was labeled 4.  #5: In the posterior uterine body there is an isoechoic heterogeneous  fibroid labeled 5, which measures 3.9 x 3.1 x 4.5 cm (does not correspond  to #5 on prior).  #6: In the right uterine fundus there is a isoechoic exophytic subserosal  fibroid labeled 6, which measures 2.7 x 2.4 x 2.7 cm.  #7: Inferior to structure 6 in the right uterine fundus is an isoechoic  fibroid, labeled 7, which measures 2.2 x 1.5 x 2.6 cm.  #8: In the left posterior body there is an isoechoic intramural fibroid,  labeled 8, which measures 3.4 x 2.7 x 3.4 cm.   There is a  nabothian cyst present in the cervix.   11/2020 [name] [name] had cardiac arrest [noted to be in V. Fib] s/p ACLS-defibrillation/intubation. Anoxic brain injury-PEG/trach; Her course was complicated by aspiration pneumonia from hospitalization needing tracheostomy/PEG tube placement.  Patient after lengthy hospital stay was discharged to rehab where she subsequently recovered.  As part of her evaluation she had a coronary CT-incidentally picked up that right lower lobe nodule.    February 27, 2021- A lobulated solid nodule is seen in the posterior right lower lobe measures 2.2 x 1.9 cm.   March 20, 2021 This was followed by a PET scan-no significant uptake in the right lower lobe lung nodule; however noted to have uterine mass/uptake -  fibroid versus malignancy  FINDINGS: Mediastinal blood pool activity: SUV max 1.2  Liver activity: SUV max NA  NECK: No areas of abnormal hypermetabolism.  Incidental CT findings: No cervical adenopathy. Minimal mucosal thickening of bilateral maxillary sinuses.  CHEST: Low-level right hilar and infrahilar nodal hypermetabolism without adenopathy is favored to be reactive. The right infrahilar hypermetabolism measures a S.U.V. max of 2.3 including on image 91/3.  The right lower lobe lung nodule measures 1.9 by 1.9 cm and is not significantly hypermetabolic, including at a S.U.V. max of 1.5. This measured 2.2 x 1.9 cm on the prior chest CT.  A dominant posterior left upper lobe pulmonary nodule measures 1.1 cm and a S.U.V. max of 1.1 on 67/3.  Incidental CT findings: Smaller bilateral pulmonary nodules as on prior chest CT.  ABDOMEN/PELVIS: An aortocaval node measures 5 mm a S.U.V. max of 3.3 on 156/3.  Left adrenal hypermetabolism laterally corresponds to low-density nodularity, favoring an adenoma. Example 1.3 cm and s.U.V. Max of 3.4.  Uterine enlargement and heterogeneity with 2 hypermetabolic lesions within. Example at ar S.U.V. max of  15.4 within the right-side of the uterine body/fundus.  Incidental CT findings: No renal calculi or hydronephrosis. Aortic atherosclerosis. Mild pelvic floor laxity.  SKELETON: Likely degenerative hypermetabolism about both shoulders. There is also hypermetabolism about the caudal portion of the medial right clavicle, in the region of the anterior first right rib. No CT correlate. Example at a S.U.V. max of 2.9.  Incidental CT findings: none  IMPRESSION: 1. Bilateral pulmonary nodules, including the dominant right lower lobe pulmonary nodule, which are not significantly FDG avid. Considerations include non FDG avid metastasis or infectious/inflammatory process. Potential clinical strategies include tissue sampling versus 3 month follow-up with CT. 2. Uterine enlargement heterogeneity, with hypermetabolic lesions within. Most likely fibroids. Given the pulmonary nodules, leiomyosarcoma(s) or metastasizing benign leiomyomas are considerations. 3. Small abdominal retroperitoneal hypermetabolic node. Most likely benign/reactive. If the patient is diagnosed with metastatic disease, nodal metastasis would be considered. 4. Right chest wall hypermetabolism adjacent the caudal portion of the medial clavicle. Most likely degenerative. The clinical history on cardiac CTA describes cardiac arrest. CT occult injury from CPR could look similar.  She presents today for evaluation. She accompanied by her husband. 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 They had two children - a boy and a girl.    Problem List: Patient Active Problem List   Diagnosis Date Noted  . Multiple lung nodules 03/27/2021  . Dysarthria and anarthria 02/11/2021  . Sinus tachycardia   . Labile blood glucose   . Dysphagia   . Seizure prophylaxis   . Malnutrition of moderate degree 01/23/2021  . Anoxic brain injury ([place]) 01/10/2021  . Prediabetes   . Dyslipidemia   . S/P  percutaneous endoscopic gastrostomy (PEG) tube placement ([place])   . Status post tracheostomy ([place])   . Aspiration pneumonia of both lower lobes due to gastric secretions ([place]) 01/05/2021  . Acute respiratory failure ([place])   . Hypoxic ischemic encephalopathy   . Endotracheal tube present   . Cardiac arrest ([place]) 12/02/2020  . Essential hypertension 12/09/2011    Past Medical History: Past Medical History:  Diagnosis Date  . Hypertension   . Seizures ([place])     Past Surgical History: Past Surgical History:  Procedure Laterality Date  . PEG PLACEMENT N/A 12/13/2020   Procedure: PERCUTANEOUS ENDOSCOPIC GASTROSTOMY (PEG) PLACEMENT;  Surgeon: Lesly Rubenstein, MD;  Location: [facility] ENDOSCOPY;  Service: Endoscopy;  Laterality: N/A;  . TRACHEOSTOMY TUBE PLACEMENT N/A 12/13/2020   Procedure: TRACHEOSTOMY;  Surgeon: Clyde Canterbury, MD;  Location: [facility] ORS;  Service: ENT;  Laterality: N/A;    Past Gynecologic History:  Menarche: 12 Menstrual details: as per HPI Menses regular: Yes History of Abnormal pap:No Last pap: obtained 04/02/2021 Contraception: None - uses withdrawal method Sexually active: no not at this time since her illness  OB History: G10P2 (1SVD - preterm; 1 C/S) OB History  Gravida Para Term Preterm AB Living  10 2     8     SAB IAB Ectopic Multiple  Live Births  8            # Outcome Date GA Lbr Len/2nd Weight Sex Delivery Anes PTL Lv  10 SAB           9 SAB           8 SAB           7 SAB           6 SAB           5 SAB           4 SAB           3 SAB           2 Para           1 Para             Family History: Family History  Problem Relation Age of Onset  . Hypertension Mother   . Leukemia Mother   . Diabetes Mother   . Healthy Father     Social History: Social History   Socioeconomic History  . Marital status: Married    Spouse name: Not on file  . Number of children: Not on file  . Years of education: Not on file  . Highest education level:  Not on file  Occupational History  . Not on file  Tobacco Use  . Smoking status: Former Smoker    Quit date: 2007    Years since quitting: 15.3  . Smokeless tobacco: Never Used  Vaping Use  . Vaping Use: Never used  Substance and Sexual Activity  . Alcohol use: No  . Drug use: Never  . Sexual activity: Not on file  Other Topics Concern  . 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    Social Determinants of Health   Financial Resource Strain: Not on file  Food Insecurity: Not on file  Transportation Needs: Not on file  Physical Activity: Not on file  Stress: Not on file  Social Connections: Not on file  Intimate Partner Violence: Not on file    Allergies: No Known Allergies  Current Medications: Current Outpatient Medications  Medication Sig Dispense Refill  . amLODipine (NORVASC) 10 MG tablet Take 1 tablet (10 mg total) by mouth daily. 30 tablet 5  . atorvastatin (LIPITOR) 80 MG tablet Take 1 tablet (80 mg total) by mouth daily. 30 tablet 5  . levETIRAcetam (KEPPRA) 750 MG tablet Take 1 tablet (750 mg total) by mouth 2 (two) times daily. 60 tablet 0  . levETIRAcetam (KEPPRA) 750 MG tablet TAKE 1 TABLET (750 MG TOTAL) BY MOUTH TWO TIMES DAILY. 60 tablet 0  . methylphenidate (RITALIN) 5 MG tablet TAKE 1 TABLET (5 MG TOTAL) BY MOUTH TWO TIMES DAILY WITH BREAKFAST AND LUNCH. 60 tablet 0  . metoprolol tartrate (LOPRESSOR) 25 MG tablet Take 1 tablet (25 mg total) by mouth 2 (two) times daily. 60 tablet 5  . potassium chloride SA (KLOR-CON M20) 20 MEQ tablet Take 2 tablets (40 mEq total) by mouth daily. 60 tablet 5  . saccharomyces boulardii (FLORASTOR) 250 MG capsule Take 1 capsule (250 mg total) by mouth 2 (two) times daily. 60 capsule 0   No current facility-administered medications for this visit.    Review of Systems General: negative for fevers, changes in weight or night  sweats Skin: negative for changes in  moles or sores or rash Eyes: negative for changes in vision HEENT: negative for change in hearing, tinnitus, voice changes Pulmonary: negative for dyspnea, orthopnea, productive cough, wheezing Cardiac: negative for palpitations, pain Gastrointestinal: negative for nausea, vomiting, constipation, diarrhea, hematemesis, hematochezia Genitourinary/Sexual: negative for dysuria, retention, hematuria, incontinence Ob/Gyn:  negative for abnormal bleeding, or pain Musculoskeletal: negative for pain, joint pain, back pain Hematology: negative for easy bruising, abnormal bleeding Neurologic/Psych: negative for headaches, seizures, paralysis, weakness, numbness   Objective:  Physical Examination:  BP (!) 151/88   Pulse 73   Temp 98.7 F (37.1 C)   Resp 20   Wt 181 lb (82.1 kg)   SpO2 100%   BMI 32.06 kg/m    ECOG Performance Status: 1 - Symptomatic but completely ambulatory  GENERAL: Patient is a well appearing female in no acute distress HEENT:  PERRL, neck supple with midline trachea. LUNGS:  Clear to auscultation bilaterally.   HEART:  Regular rate and rhythm.  ABDOMEN:  Soft, nontender and nondistended. Pelvic mass c/w uterus - 14-16 week size, but somewhat difficult exam due to habitus. No hernias, ascites or upper abdominal masses.  MSK:  No focal spinal tenderness to palpation. Full range of motion bilaterally in the upper extremities. EXTREMITIES:  No peripheral edema.   SKIN:  Clear with no obvious rashes or skin changes. No nail dyscrasia. NEURO:  Nonfocal. Well oriented.  Appropriate affect.  Pelvic: EGBUS: no lesions Cervix: no lesions, nontender, mobile; Pap obtained Vagina: no lesions, no discharge or bleeding Uterus: enlarged 14 week size; nontender, limited mobility but parameter free bilaterally. Adnexa: no palpable masses but limited by uterine size Rectovaginal: deferred  Lab Review Labs on site today:  Ref Range & Units 3  wk ago  WBC (White Blood Cell Count) 4.1 - 10.2 10^3/uL 5.1   RBC (Red Blood Cell Count) 4.04 - 5.48 10^6/uL 3.75Low   Hemoglobin 12.0 - 15.0 gm/dL 11.0Low   Hematocrit 35.0 - 47.0 % 34.3Low   MCV (Mean Corpuscular Volume) 80.0 - 100.0 fl 91.5   MCH (Mean Corpuscular Hemoglobin) 27.0 - 31.2 pg 29.3   MCHC (Mean Corpuscular Hemoglobin Concentration) 32.0 - 36.0 gm/dL 32.1   Platelet Count 150 - 450 10^3/uL 232    Glucose 70 - 110 mg/dL 83   Sodium 136 - 145 mmol/L 140   Potassium 3.6 - 5.1 mmol/L 3.5Low   Chloride 97 - 109 mmol/L 104   Carbon Dioxide (CO2) 22.0 - 32.0 mmol/L 26.2   Urea Nitrogen (BUN) 7 - 25 mg/dL 9   Creatinine 0.6 - 1.1 mg/dL 0.5Low   Glomerular Filtration Rate (eGFR), MDRD Estimate >60 mL/min/1.73sq m 156   Calcium 8.7 - 10.3 mg/dL 8.8   AST  8 - 39 U/L 23   ALT  5 - 38 U/L 28   Alk Phos (alkaline Phosphatase) 34 - 104 U/L 107High   Albumin 3.5 - 4.8 g/dL 3.7   Bilirubin, Total 0.3 - 1.2 mg/dL 0.4   Protein, Total 6.1 - 7.9 g/dL 6.6   A/G Ratio 1.0 - 5.0 gm/dL 1.3       Radiologic Imaging: PET images reviewed personally. I contacted Dr. Gillis Ends at [facility] and he will review [place] dating back to 2009 and also review PET imaging with leiomyoma.       Assessment:  [name] [name] is a 55 y.o. female diagnosed with PET avid uterine masses c/w leiomyoma, overall stable size on imaging and asymptomatic. Fibroids can be PET avid Ma et al  [place]. 2016).   Mild anemia, MCV normal, asymptomatic   Medical co-morbidities complicating care: H/o cardiac arrest and anoxic brain injury.  Plan:   Problem List Items Addressed This Visit      Other   Multiple lung nodules   Relevant Orders   MR PELVIS W WO CONTRAST    Other Visit Diagnoses    Uterine leiomyoma, unspecified location    -  Primary   Relevant Orders   MR PELVIS W WO CONTRAST   IGP, Aptima HPV   Follicle stimulating hormone      We discussed options for management and I recommended  pelvic MRI for further characterization, Pap/HPV costesting obtained today, [place] ordered. Dr. Gillis Ends will review her films and follow up with me.   We discussed contraception. At this point we will continue to follow and will discuss at her next visit.   Continue to follow up with her other physicians regarding other medical issues and abnormal lab results.   Suggested return to clinic after MRI.   The patient's diagnosis, an outline of the further diagnostic and laboratory studies which will be required, the recommendation, and alternatives were discussed.  All questions were answered to the patient's satisfaction.  A total of 75 minutes were spent with the patient/family today; >50% was spent in education, counseling and coordination of care for PET avid uterine masses c/w leiomyoma.  [name] [name] An Gaetana Michaelis, MD

## 2021-04-02 NOTE — Patient Instructions (Addendum)
KEPPRA TAPER:  ONE PILL AT BEDTOIME FOR 7 DAYS THEN STOP.    RITALIN TAPER: ONE PILL IN MORNING UNTIL GONE.     No work for at least 3 months.   You may do things around the house, work in the yard.

## 2021-04-03 ENCOUNTER — Other Ambulatory Visit: Payer: Self-pay

## 2021-04-03 ENCOUNTER — Ambulatory Visit: Payer: Self-pay | Admitting: Speech Pathology

## 2021-04-03 DIAGNOSIS — R41841 Cognitive communication deficit: Secondary | ICD-10-CM

## 2021-04-03 DIAGNOSIS — G931 Anoxic brain damage, not elsewhere classified: Secondary | ICD-10-CM

## 2021-04-03 LAB — FOLLICLE STIMULATING HORMONE: FSH: 6.7 m[IU]/mL

## 2021-04-03 NOTE — Progress Notes (Signed)
Tumor Board Documentation  [name] [name] was presented by Verlon Au, RN at our Tumor Board on 04/03/2021, which included representatives from medical oncology,radiation oncology,internal medicine,navigation,pathology,radiology,surgical,pulmonology,research,palliative care.  [name] [name] currently presents as a new patient,for discussion with history of the following treatments: active survellience.  Additionally, we reviewed previous medical and familial history, history of present illness, and recent lab results along with all available histopathologic and imaging studies. The tumor board considered available treatment options and made the following recommendations: Additional screening (CT Pulmonary Artery) Referred to Dr Sanjuana Mae and to [place]  The following procedures/referrals were also placed: No orders of the defined types were placed in this encounter.   Clinical Trial Status: not discussed   Staging used:    AJCC Staging:       Group: Uterine Leiomyoma   National site-specific guidelines   were discussed with respect to the case.  Tumor board is a meeting of clinicians from various specialty areas who evaluate and discuss patients for whom a multidisciplinary approach is being considered. Final determinations in the plan of care are those of the provider(s). The responsibility for follow up of recommendations given during tumor board is that of the provider.   Today's extended care, comprehensive team conference, [name] [name] was not present for the discussion and was not examined.   Multidisciplinary Tumor Board is a multidisciplinary case peer review process.  Decisions discussed in the Multidisciplinary Tumor Board reflect the opinions of the specialists present at the conference without having examined the patient.  Ultimately, treatment and diagnostic decisions rest with the primary provider(s) and the patient.

## 2021-04-04 ENCOUNTER — Telehealth: Payer: Self-pay

## 2021-04-04 DIAGNOSIS — R918 Other nonspecific abnormal finding of lung field: Secondary | ICD-10-CM

## 2021-04-04 NOTE — Therapy (Addendum)
[place] MAIN [facility] SERVICES [address] [place], [place], [address] Phone: [phone]   Fax:  [phone]  Speech Language Pathology Treatment  Patient Details  Name: [name] [name] MRN: [id number] Date of Birth: [birth year] Referring Provider (SLP): [place], [place]   Encounter Date: 04/03/2021   End of Session - 04/04/21 2156    Visit Number 12    Number of Visits 25    Date for SLP Re-Evaluation 05/06/21    Authorization Type Medicaid    Authorization Time Period 02/11/2021 thru 05/06/2021    Authorization - Visit Number 2    Progress Note Due on Visit 10    SLP Start Time 1300    SLP Stop Time  1400    SLP Time Calculation (min) 60 min    Activity Tolerance Patient tolerated treatment well           Past Medical History:  Diagnosis Date  . Hypertension   . Seizures ([place])     Past Surgical History:  Procedure Laterality Date  . PEG PLACEMENT N/A 12/13/2020   Procedure: PERCUTANEOUS ENDOSCOPIC GASTROSTOMY (PEG) PLACEMENT;  Surgeon: Lesly Rubenstein, MD;  Location: [facility] ENDOSCOPY;  Service: Endoscopy;  Laterality: N/A;  . TRACHEOSTOMY TUBE PLACEMENT N/A 12/13/2020   Procedure: TRACHEOSTOMY;  Surgeon: Clyde Canterbury, MD;  Location: [facility] ORS;  Service: ENT;  Laterality: N/A;    There were no vitals filed for this visit.   Subjective Assessment - 04/04/21 2155    Subjective pt pleasant, remembered her glasses and her memory book    Currently in Pain? No/denies            Neuro   ST   TX   NOTE          Treatment Data and Patient's Response to Treatment   Skilled treatment session focused on pt's cognitive communication goals. SLP facilitated session by providing organizational sequence activities targeting activities within the home. Pt required moderate to maximal cues for sequencing 5 basic steps. Specifically, it benefited pt to have steps written out as she sequenced them instead of placing numbers beside each  step. This strategy helped pt organize her thoughts. She also required cues to decrease impulsivity as well as moderate cues to increase over-articulation and vocal intensity to achieve ~ 75% intelligibility at the sentence level.    Pt continues with severe dysarthria c/b slurred speech and decreased vocal intensity that results in < 50% speech intelligibility at the conversation level. While she continues with significant impairments in cognition, she has demonstrated progress in the areas of selective attention, basic recall of family member's names, recall of orientation information as well as oropharyngeal abilities when consuming regular diet with thin liquids. These STGs have been upgraded and are appropriate for next reporting period. Skilled ST remains necessary to target pt's expressive ability, attention, recall, problem solving, awareness as well as executive functions.             SLP Short Term Goals - 03/20/21 0941      SLP SHORT TERM GOAL #1   Title Pt will independently demonstrate selective attention in mildly distracting environment for 15 minutes.    Baseline goal met (03/20/2021); upgraded    Time 10    Period --   sessions   Status Revised      SLP SHORT TERM GOAL #2   Title With moderate cues, pt will utilize speech intelligibility strategies to achieve > 90% intelligibility at the phrase  level.    Baseline Goal not met in time period. Goal downgraded from minimal cues to moderate cues.    Time 10    Period --   sessions   Status Revised      SLP SHORT TERM GOAL #3   Title With moderate cues, pt will utilize compensatory memory strategies to recall therapy sessions for week as well as order of therapy in 8 out of 10 sessions.    Baseline goal met (03/20/2021); goal updated    Time 10    Period --   sessions   Status Revised      SLP SHORT TERM GOAL #4   Title With moderate assistance, pt will complete basic problem solving tasks with >95% accuracy.     Baseline Goal met (03/20/2021); goal updated    Time 10    Period --   sessions   Status Revised      SLP SHORT TERM GOAL #5   Title Pt will consume current diet without any reports s/s of dysphagia by pt or family members.    Status Achieved            SLP Long Term Goals - 03/22/21 1447      SLP LONG TERM GOAL #1   Title Pt will consume safest, least restrictive diet without overt s/s of dysphagia, aspiration or respiratory distress.    Status Achieved      SLP LONG TERM GOAL #2   Title Pt will independently complete functional problem solving for basic and familiar tasks.    Status On-going      SLP LONG TERM GOAL #3   Title Pt will demonstrate selective attention in a moderately distracting environment for 15 minutes with < 1 redirection to task.    Status On-going      SLP LONG TERM GOAL #4   Title With minimal cues, pt will increase speech intelligibility to > 75% at the sentence level.    Status On-going            Plan - 04/04/21 2157    Clinical Impression Statement Pt continues with severe dysarthria c/b slurred speech and decreased vocal intensity that results in < 50% speech intelligibility at the conversation level. While she continues with significant impairments in cognition, she has demonstrated progress in the areas of selective attention, basic recall of family member's names, recall of orientation information as well as oropharyngeal abilities when consuming regular diet with thin liquids. These STGs have been upgraded and are appropriate for next reporting period. Skilled ST remains necessary to target pt's expressive ability, attention, recall, problem solving, awareness as well as executive functions.           Patient will benefit from skilled therapeutic intervention in order to improve the following deficits and impairments:   Cognitive communication deficit  Anoxic brain injury [facility])    Problem List Patient Active Problem List   Diagnosis  Date Noted  . Multiple lung nodules 03/27/2021  . Dysarthria and anarthria 02/11/2021  . Sinus tachycardia   . Labile blood glucose   . Dysphagia   . Seizure prophylaxis   . Malnutrition of moderate degree 01/23/2021  . Anoxic brain injury ([place]) 01/10/2021  . Prediabetes   . Dyslipidemia   . S/P percutaneous endoscopic gastrostomy (PEG) tube placement ([place])   . Status post tracheostomy ([place])   . Aspiration pneumonia of both lower lobes due to gastric secretions ([place]) 01/05/2021  . Acute respiratory failure ([place])   .  Hypoxic ischemic encephalopathy   . Endotracheal tube present   . Cardiac arrest ([place]) 12/02/2020  . Essential hypertension 12/09/2011   Adorian Gwynne B. Rutherford Nail M.S., CCC-SLP, [place] Pathologist Rehabilitation Services Office [phone]  Stormy Fabian 04/04/2021, 9:57 PM  [place] MAIN [facility] SERVICES [address] [place], [place], [address] Phone: [phone]   Fax:  [phone]   Name: MOLLEY HOUSER MRN: [id number] Date of Birth: 1966-03-25

## 2021-04-04 NOTE — Telephone Encounter (Signed)
Per tumor board discussion, CTPA ordered and scheduled. Called and discussed the tumor board meeting with spouse, Marguerite Olea. 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 Went over the appointment details and instructions.

## 2021-04-05 LAB — IGP, APTIMA HPV: HPV Aptima: NEGATIVE

## 2021-04-07 ENCOUNTER — Ambulatory Visit: Payer: Self-pay

## 2021-04-07 ENCOUNTER — Ambulatory Visit: Payer: Self-pay | Admitting: Speech Pathology

## 2021-04-07 ENCOUNTER — Other Ambulatory Visit: Payer: Self-pay

## 2021-04-07 ENCOUNTER — Ambulatory Visit: Payer: Self-pay | Admitting: Occupational Therapy

## 2021-04-07 ENCOUNTER — Encounter: Payer: Self-pay | Admitting: Occupational Therapy

## 2021-04-07 DIAGNOSIS — R41841 Cognitive communication deficit: Secondary | ICD-10-CM

## 2021-04-07 DIAGNOSIS — M6281 Muscle weakness (generalized): Secondary | ICD-10-CM

## 2021-04-07 DIAGNOSIS — R278 Other lack of coordination: Secondary | ICD-10-CM

## 2021-04-07 DIAGNOSIS — G931 Anoxic brain damage, not elsewhere classified: Secondary | ICD-10-CM

## 2021-04-07 NOTE — Therapy (Signed)
[name] [address] [place], [place], [address] Phone: [phone]   Fax:  [phone]  Occupational Therapy Treatment  Patient Details  Name: [name] [name] MRN: [id number] Date of Birth: [birth year] No data recorded  Encounter Date: 04/07/2021   OT End of Session - 04/07/21 1650    Visit Number 9    Number of Visits 24    Date for OT Re-Evaluation 05/13/21    Authorization Type Progress reports period starting 02/11/2021    OT Start Time 1600    OT Stop Time 1645    OT Time Calculation (min) 45 min    Activity Tolerance Patient tolerated treatment well    Behavior During Therapy [name] for tasks assessed/performed           Past Medical History:  Diagnosis Date  . Hypertension   . Seizures ([place])     Past Surgical History:  Procedure Laterality Date  . PEG PLACEMENT N/A 12/13/2020   Procedure: PERCUTANEOUS ENDOSCOPIC GASTROSTOMY (PEG) PLACEMENT;  Surgeon: Lesly Rubenstein, MD;  Location: [facility] ENDOSCOPY;  Service: Endoscopy;  Laterality: N/A;  . TRACHEOSTOMY TUBE PLACEMENT N/A 12/13/2020   Procedure: TRACHEOSTOMY;  Surgeon: Clyde Canterbury, MD;  Location: [facility] ORS;  Service: ENT;  Laterality: N/A;    There were no vitals filed for this visit.   Subjective Assessment - 04/07/21 1650    Subjective  Pt. reports doing well today.    Patient is accompanied by: Family member    Pertinent History Pt. is a 55 y.o. female who went into cardiac arrest, and was [facility]. Pt. was diagnosed with Anoxic Brain Injury following the Cardiac Arrest. When medically stable, the pt. was tranferred to [facility] re[facility]. The patient was discharged home on 02/08/2021, and is ready to begin outpatient OT services. Pt. was independent, and working prior to the onset. Pt. Has very supportive family. Pt. Enjoys gardening.    Currently in Pain? No/denies          OT TREATMENT    Selfcare:  Pt. worked  on [employer]. completed a 10 min. typing test with 64% accuracy with 3 wpm.  Pt. worked on formulating, and typing simple email correspondence.  Pt. reports that she continues to work on typing skills at home. Pt. is improving with navigating the computer keyboard. Pt. Requires increased time to complete typing tasks, however was able to navigate through the keyboard more efficiently than the previous session. Education was provided to the pt., and her son about opportuunities for typing emails with family members for practice.  Pt. was able to initiate identifying mistypes, and was independently able to correct them 75% of the time. Pt. continues to work on typing accuracy, key location, and typing speed in preparation for being able to type an email correspondence.                           OT Education - 04/07/21 1650    Education Details IADLs    Person(s) Educated Patient;Child(ren)    Methods Explanation;Demonstration    Comprehension Verbalized understanding;Returned demonstration               OT Long Term Goals - 02/11/21 1459      OT LONG TERM GOAL #1   Title Pt. will be able to independently sign her name with 100% legibility.    Baseline Eval: Signature not legible, however  was able to sign on the line with no deviation above, or below the line.    Time 12    Period Weeks    Status New    Target Date 05/13/21      OT LONG TERM GOAL #2   Title Pt. will increase right shoulder AROM to be able to independently retrieve items from closets, and cabinets.    Baseline Eval: Pt. presents with limited ROM.    Time 12    Period Weeks    Status New    Target Date 05/13/21      OT LONG TERM GOAL #3   Title Pt. will increase right grip strength by 5# to be able hold a full glass    Baseline Eval: limited grip strength. Limited bilateral digit flexion, unable to make a full composite fist.    Time 12    Period Weeks    Status New    Target Date  05/13/21      OT LONG TERM GOAL #4   Title Pt. will independently access a computer, and type a one sentence email correspondence.    Baseline Eval: Pt. is unable to to perform    Time 12    Period Weeks    Status New    Target Date 05/13/21      OT LONG TERM GOAL #5   Title Pt. will improve bilateral [name] skills to be able to grasp, and manipulate small objects during ADLs.    Baseline Eval: Pt. presents [name] skills with difficulty picking,a nd manipulating small objects.    Time 12    Period Weeks    Status New    Target Date 05/13/21      OT LONG TERM GOAL #6   Title Pt. will improve UE strength by 2 mm grades to assist with ADLs, and IADLS,    Baseline Eval: pt. has difficulty performing.    Time 12    Period Weeks    Status New    Target Date 05/13/21      OT LONG TERM GOAL #7   Title Pt. will complete light meal preparation with minA    Baseline Eval: Pt. is unable to prepare light meal preparation.    Time 12    Period Weeks    Status New    Target Date 05/13/21      OT LONG TERM GOAL #8   Title Pt. will perform light home management tasks with minA    Baseline Eval: pt. is unable to perform    Time 12    Period Weeks    Status New    Target Date 05/13/21                 Plan - 04/07/21 1651    Clinical Impression Statement Pt. reports that she continues to work on typing skills at home. Pt. is improving with navigating the computer keyboard. Pt. Requires increased time to complete typing tasks, however was able to navigate through the keyboard more efficiently than the previous session. Education was provided to the pt., and her son about opportuunities for typing emails with family members for practice.  Pt. was able to initiate identifying mistypes, and was independently able to correct them 75% of the time. Pt. continues to work on typing accuracy, key location, and typing speed in preparation for being able to type an email correspondence.     OT Occupational Profile and History Problem Focused Assessment - Including  review of records relating to presenting problem    Occupational performance deficits (Please refer to evaluation for details): ADL's;IADL's    Body Structure / Function / Physical Skills ADL;IADL;FMC;ROM;Strength    Rehab Potential Good    Clinical Decision Making Several treatment options, min-mod task modification necessary    Comorbidities Affecting Occupational Performance: May have comorbidities impacting occupational performance    Modification or Assistance to Complete Evaluation  Min-Moderate modification of tasks or assist with assess necessary to complete eval    OT Frequency 2x / week    OT Duration 12 weeks    OT Treatment/Interventions Self-care/ADL training;Neuromuscular education;Therapeutic activities;Patient/family education;DME and/or AE instruction;Visual/perceptual remediation/compensation    Consulted and Agree with Plan of Care Patient           Patient will benefit from skilled therapeutic intervention in order to improve the following deficits and impairments:   Body Structure / Function / Physical Skills: ADL,IADL,FMC,ROM,Strength       Visit Diagnosis: Muscle weakness (generalized)  Other lack of coordination    Problem List Patient Active Problem List   Diagnosis Date Noted  . Multiple lung nodules 03/27/2021  . Dysarthria and anarthria 02/11/2021  . Sinus tachycardia   . Labile blood glucose   . Dysphagia   . Seizure prophylaxis   . Malnutrition of moderate degree 01/23/2021  . Anoxic brain injury ([name]) 01/10/2021  . Prediabetes   . Dyslipidemia   . S/P percutaneous endoscopic gastrostomy (PEG) tube placement ([place])   . Status post tracheostomy ([place])   . Aspiration pneumonia of both lower lobes due to gastric secretions ([place]) 01/05/2021  . Acute respiratory failure ([place])   . Hypoxic ischemic encephalopathy   . Endotracheal tube present   . Cardiac arrest ([place])  12/02/2020  . Essential hypertension 12/09/2011    Harrel Carina, MS, OTR/L 04/07/2021, 4:55 PM  [place] MAIN [facility] SERVICES [address] [place], [place], [address] Phone: [phone]   Fax:  [phone]  Name: EMER ONNEN MRN: [id number] Date of Birth: [birth year]

## 2021-04-08 ENCOUNTER — Ambulatory Visit
Admission: RE | Admit: 2021-04-08 | Discharge: 2021-04-08 | Disposition: A | Discharge status: Home / Self Care | Payer: Self-pay | Source: Ambulatory Visit | Attending: Obstetrics and Gynecology | Admitting: Obstetrics and Gynecology

## 2021-04-08 DIAGNOSIS — R918 Other nonspecific abnormal finding of lung field: Secondary | ICD-10-CM | POA: Insufficient documentation

## 2021-04-08 DIAGNOSIS — D259 Leiomyoma of uterus, unspecified: Secondary | ICD-10-CM | POA: Insufficient documentation

## 2021-04-08 IMAGING — MR MR PELVIS WO/W CM
18 of 21 series · 41 of 48 positions shown · IV contrast (8ml Gadavist)
Comparison: PET-CT from [DATE]

CLINICAL DATA: Evaluate uterine fibroids.

EXAM:
MRI PELVIS WITHOUT AND WITH CONTRAST
TECHNIQUE: Multiplanar multisequence MR imaging of the pelvis was performed
both before and after administration of intravenous contrast.
CONTRAST:  8mL GADAVIST GADOBUTROL 1 MMOL/ML IV SOLN

[Series 2: T2 · coronal · 5.0mm · 1.56mm/px · 3 of 42 slices shown (1 of 4)]
[im 1/42]
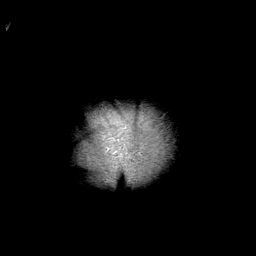
[im 21/42]
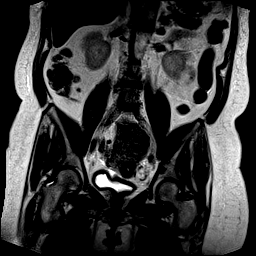
[im 42/42]
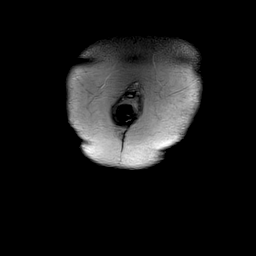

[Series 3: T2 · axial · 5.0mm · 0.47mm/px · z∈[-80,+124]mm · 2 of 35 slices shown (2 of 4)]
[im 1/35]
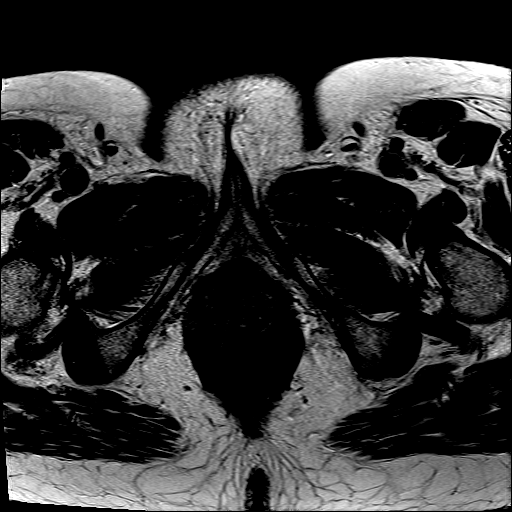
[im 35/35]
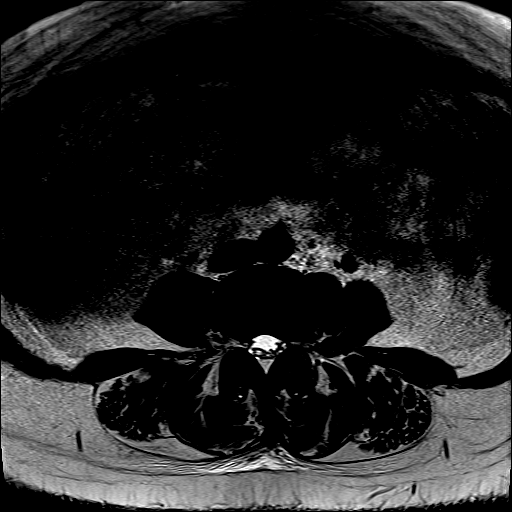

[Series 5: T2 · axial · 5.0mm · 0.47mm/px · z∈[-80,+124]mm · 2 of 35 slices shown (3 of 4)]
[im 1/35]
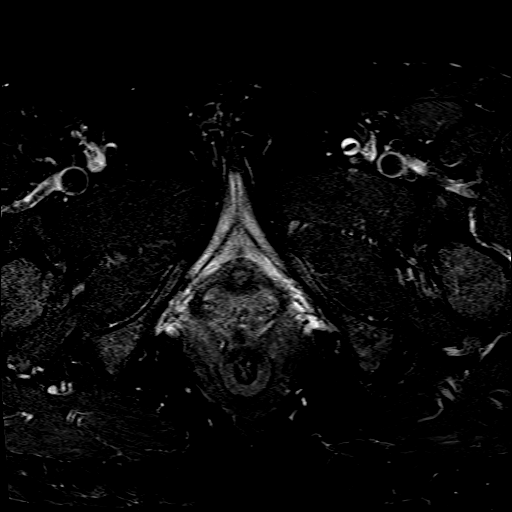
[im 35/35]
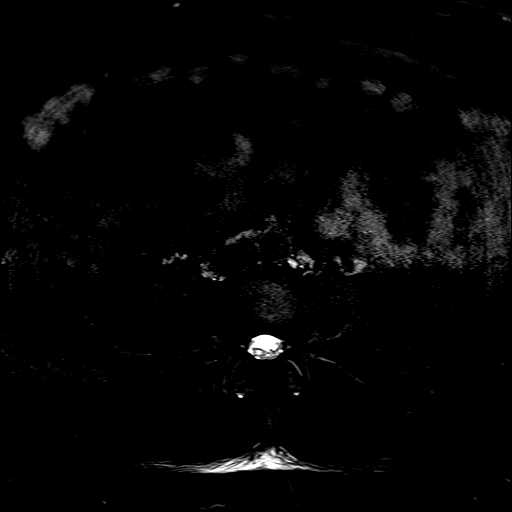

[Series 6: T2 · sagittal · 5.0mm · 0.75mm/px · 2 of 33 slices shown (4 of 4)]
[im 1/33]
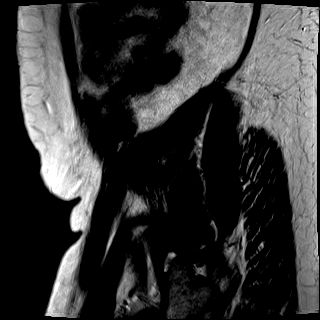
[im 33/33]
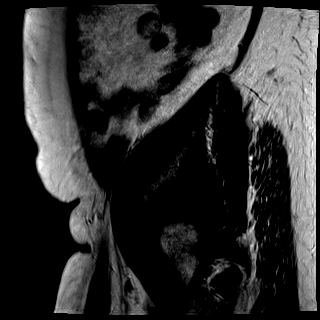

[Series 7: T1 dynamic · axial · 4.0mm · 0.75mm/px · z∈[-80,+124]mm · 2 of 52 slices shown (1 of 10)]
[im 1/52]
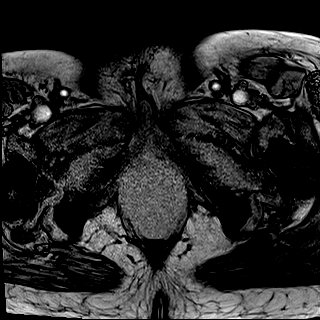
[im 52/52]
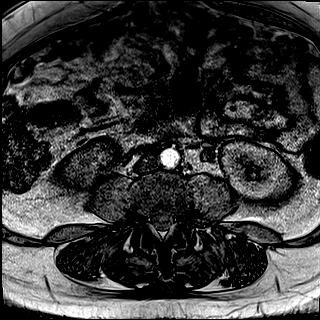

[Series 8: T1 dynamic · axial · 4.0mm · 0.75mm/px · z∈[-80,+124]mm · 2 of 52 slices shown (2 of 10)]
[im 1/52]
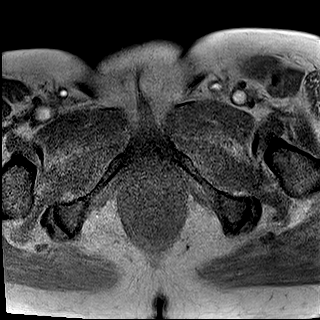
[im 52/52]
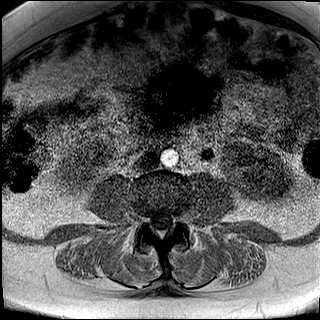

[Series 9: T1 dynamic · axial · 4.0mm · 0.75mm/px · z∈[-80,+124]mm · 2 of 52 slices shown (3 of 10)]
[im 1/52]
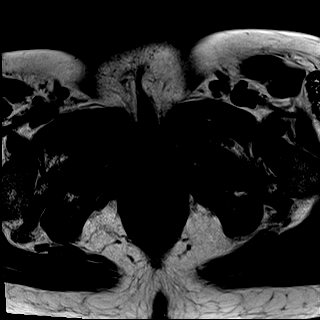
[im 52/52]
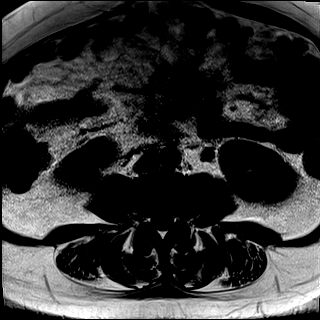

[Series 10: T1 dynamic · axial · 4.0mm · 0.75mm/px · z∈[-80,+124]mm · 2 of 52 slices shown (4 of 10)]
[im 1/52]
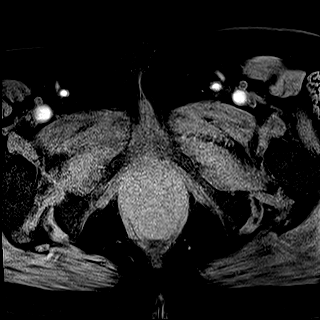
[im 52/52]
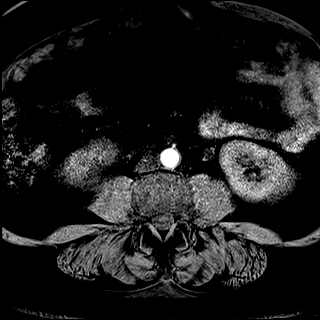

[Series 12: DWI · axial · 4.0mm · 1.42mm/px · z∈[-56,+109]mm · 5 of 102 slices shown]
[im 1/102]
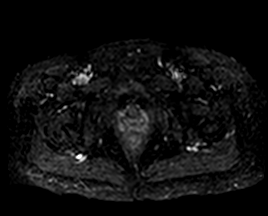
[im 26/102]
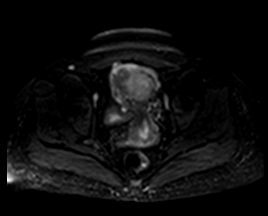
[im 51/102]
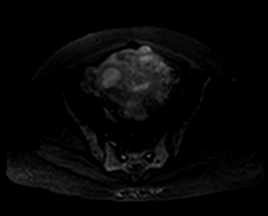
[im 76/102]
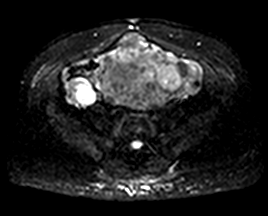
[im 102/102]
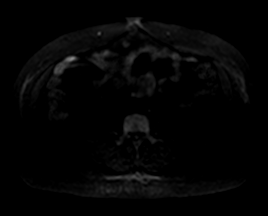

[Series 13: ax dwi_adc · axial · 4.0mm · 1.42mm/px · z∈[-56,+109]mm · 2 of 34 slices shown]
[im 1/34]
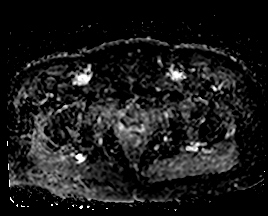
[im 34/34]
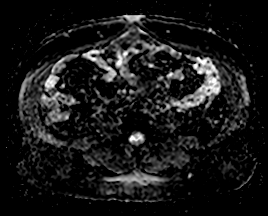

[Series 14: T2 fat-sat · coronal · 4.0mm · 0.97mm/px · 2 of 35 slices shown]
[im 1/35]
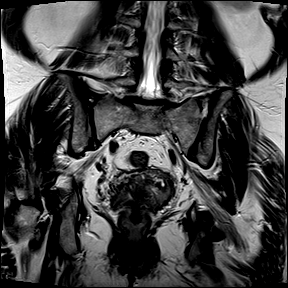
[im 35/35]
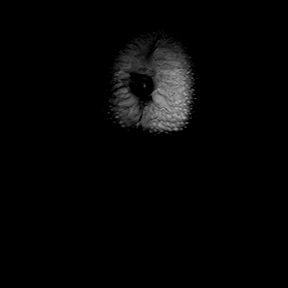

[Series 17: T1 dynamic · axial · 4.0mm · 0.75mm/px · z∈[-80,+124]mm · 2 of 52 slices shown (5 of 10)]
[im 1/52]
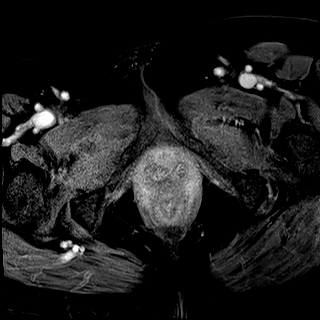
[im 52/52]
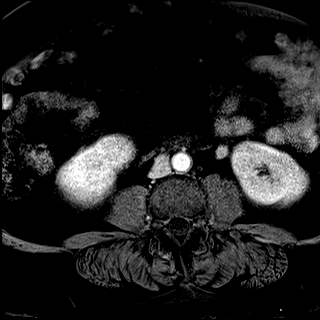

[Series 18: T1 dynamic · axial · 4.0mm · 0.75mm/px · z∈[-80,+124]mm · 2 of 52 slices shown (6 of 10)]
[im 1/52]
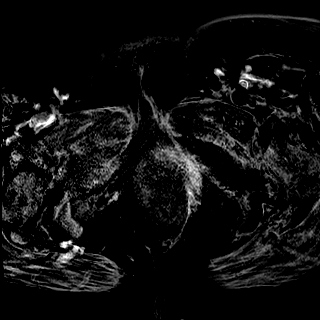
[im 52/52]
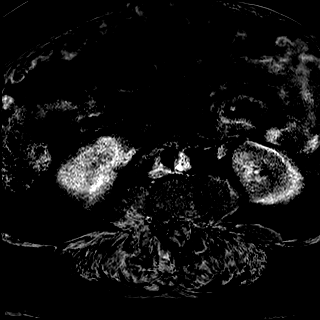

[Series 21: T1 dynamic · axial · 4.0mm · 0.75mm/px · z∈[-80,+124]mm · 2 of 52 slices shown (7 of 10)]
[im 1/52]
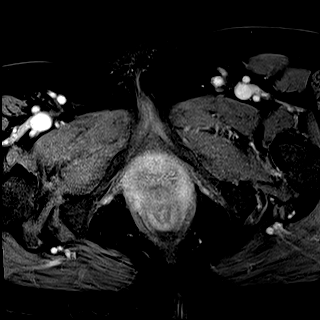
[im 52/52]
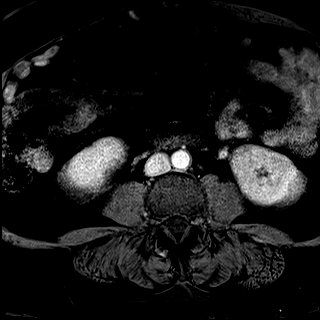

[Series 22: T1 dynamic · axial · 4.0mm · 0.75mm/px · z∈[-80,+124]mm · 2 of 52 slices shown (8 of 10)]
[im 1/52]
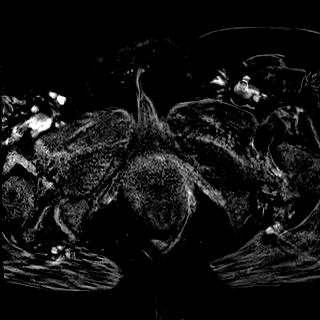
[im 52/52]
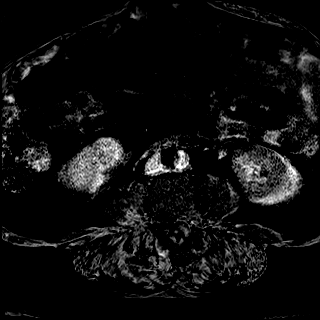

[Series 25: T1 dynamic · axial · 4.0mm · 0.75mm/px · z∈[-80,+124]mm · 2 of 52 slices shown (9 of 10)]
[im 1/52]
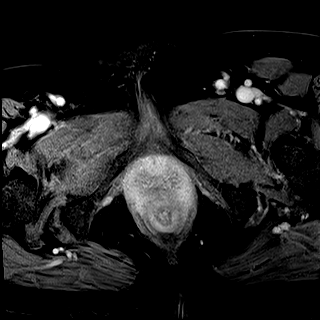
[im 52/52]
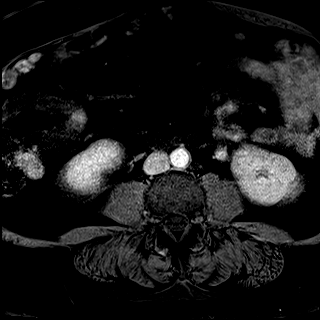

[Series 26: T1 dynamic · axial · 4.0mm · 0.75mm/px · z∈[-80,+124]mm · 2 of 52 slices shown (10 of 10)]
[im 1/52]
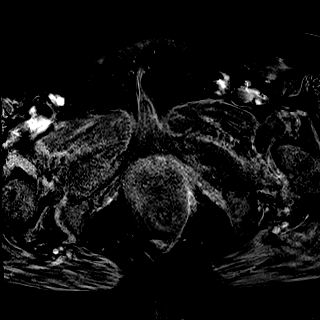
[im 52/52]
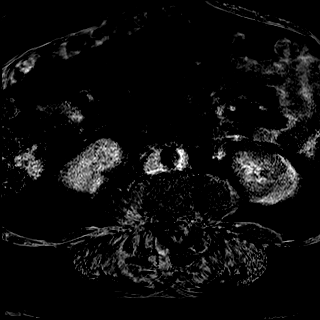

[Series 27: T1 fat-sat post-contrast · sagittal · 3.0mm · 0.88mm/px · 3 of 72 slices shown]
[im 1/72]
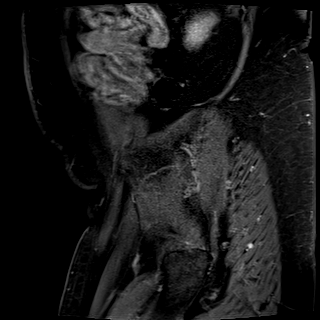
[im 36/72]
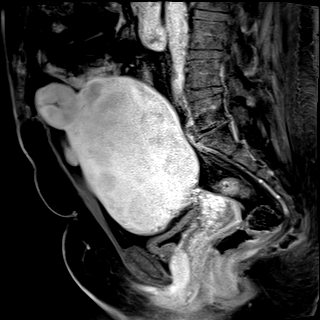
[im 72/72]
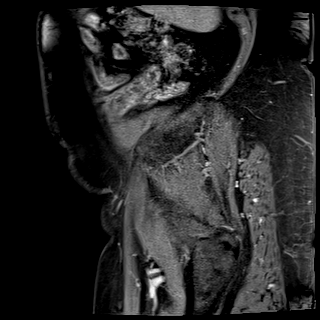

[41 of 48 positions shown; findings below may reference images not displayed]

FINDINGS: Urinary Tract:  Urinary bladder is within normal limits.

Bowel:  Unremarkable visualized pelvic bowel loops.

Vascular/Lymphatic: Patent vasculature. No aneurysm identified. No
pelvic adenopathy. Right pelvic sidewall lymph node is upper limits
of normal measuring 9 mm previously 8 mm, image 31/21.

Reproductive: Uterus: Measures 13.0 x 9.8 by 13.2 cm (volume = 880
cm^3). There are multiple uterine fibroids, too numerous to count.
The FDG avid fibroids within the posterior lower uterine segment and
anterior lower uterine segment fibroids are intramural, submucosal,
and subserosal including:

-FDG avid fibroid within the left anterior lower uterine segment
measures 2.3 x 2.5 by 2.3 cm, image 32/17 and image 46/27. This
exhibits T2 hypointensity, T1 isointensity with heterogeneous
hypoenhancement on the postcontrast images.

-Smaller FDG avid fibroid within the posterior lower uterine segment
is T2 hypointense, T1 isointense and exhibits avid enhancement on
the postcontrast images.

-not significantly FDG avid is the dominant fibroid within the left
side of fundus with a long axis of 6.4 cm, image 18/14. This
exhibits uniform enhancement on the postcontrast images.

-also non FDG avid is a exophytic subserosal fibroid arising off the
anterior fundus measures 4 cm, image 36/27. this also exhibits
relative uniform enhancement on the postcontrast images.

Endometrium: Measures 5 mm, image 18/28. No focal abnormality aside
from submucosal fibroids.

Right ovary: Measures 4.4 x 3.5 by 3.9 cm (volume = 31 cm^3).
Multiple prominent cysts are identified. The largest measures
cm, image [DATE].

Left ovary: Measures 4.9 x 1.6 by 2.4 cm (volume = 9.9 cm^3).
Prominent follicles are noted. The largest measures 8 mm, image
[DATE].

Other: Trace free fluid noted within the cul-de-sac. No discrete
fluid collections.

Musculoskeletal: Diastasis recti is identified with ventral
herniation of anterior fundal fibroid, image [DATE].
IMPRESSION: 1. Enlarged uterus with uterine fibroids which are too numerous to
count. There are no distinguishing characteristics between the FDG
avid and non FDG avid fibroids identified on recent PET-CT. The FDG
avid fibroid within the left anterior lower uterine segment exhibits
relative hypoenhancement on the postcontrast images. However, the
FDG avid fibroid within the posterior lower uterine segment exhibits
relative uniform enhancement on the postcontrast images. If there is
a clinical concern for leiomyosarcoma or metastasizing benign
leiomyomas correlation with tissue sampling is recommended.
2. No acute findings within the pelvis.
3. Diastasis recti with ventral herniation of the anterior fundal
fibroid.

## 2021-04-08 MED ORDER — GADOBUTROL 1 MMOL/ML IV SOLN
8.0000 mL | Freq: Once | INTRAVENOUS | Status: AC | PRN
  Administered 2021-04-08: 8 mL via INTRAVENOUS

## 2021-04-09 ENCOUNTER — Encounter: Payer: Self-pay | Admitting: Occupational Therapy

## 2021-04-09 ENCOUNTER — Ambulatory Visit: Payer: Self-pay | Admitting: Speech Pathology

## 2021-04-09 ENCOUNTER — Encounter: Payer: Self-pay | Admitting: Obstetrics and Gynecology

## 2021-04-09 ENCOUNTER — Ambulatory Visit: Payer: Self-pay

## 2021-04-09 ENCOUNTER — Ambulatory Visit: Payer: Self-pay | Admitting: Occupational Therapy

## 2021-04-09 ENCOUNTER — Other Ambulatory Visit: Payer: Self-pay

## 2021-04-09 DIAGNOSIS — R41841 Cognitive communication deficit: Secondary | ICD-10-CM

## 2021-04-09 DIAGNOSIS — R278 Other lack of coordination: Secondary | ICD-10-CM

## 2021-04-09 DIAGNOSIS — G931 Anoxic brain damage, not elsewhere classified: Secondary | ICD-10-CM

## 2021-04-09 DIAGNOSIS — M6281 Muscle weakness (generalized): Secondary | ICD-10-CM

## 2021-04-09 NOTE — Therapy (Signed)
[place] MAIN [facility] SERVICES [address] [place], [place], [address] Phone: [phone]   Fax:  [phone]  Occupational Therapy Progress Note  Dates of reporting period  02/11/2021   to   04/09/2021  Patient Details  Name: [name] [name] MRN: [id number] Date of Birth: January 12, 1966 No data recorded  Encounter Date: 04/09/2021   OT End of Session - 04/09/21 1655    Visit Number 10    Number of Visits 24    Date for OT Re-Evaluation 05/13/21    Authorization Type Progress reports period starting 02/11/2021    OT Start Time 1600    OT Stop Time 1645    OT Time Calculation (min) 45 min    Activity Tolerance Patient tolerated treatment well    Behavior During Therapy [facility] for tasks assessed/performed           Past Medical History:  Diagnosis Date  . Hypertension   . Seizures ([place])     Past Surgical History:  Procedure Laterality Date  . PEG PLACEMENT N/A 12/13/2020   Procedure: PERCUTANEOUS ENDOSCOPIC GASTROSTOMY (PEG) PLACEMENT;  Surgeon: Lesly Rubenstein, MD;  Location: [facility] ENDOSCOPY;  Service: Endoscopy;  Laterality: N/A;  . TRACHEOSTOMY TUBE PLACEMENT N/A 12/13/2020   Procedure: TRACHEOSTOMY;  Surgeon: Clyde Canterbury, MD;  Location: [facility] ORS;  Service: ENT;  Laterality: N/A;    There were no vitals filed for this visit.   Subjective Assessment - 04/09/21 1655    Subjective  Pt. reports doing well today.    Patient is accompanied by: Family member    Pertinent History Pt. is a 55 y.o. female who went into cardiac arrest, and was admitted to [facility] on 12/02/2020. Pt. was diagnosed with Anoxic Brain Injury following the Cardiac Arrest. When medically stable, the pt. was tranferred to [place] rehab. The patient was discharged home on 02/08/2021, and is ready to begin outpatient OT services. Pt. was independent, and working prior to the onset. Pt. Has very supportive family. Pt. Enjoys gardening.    Currently in Pain?  No/denies           OT TREATMENT    Neuro muscular re-education:  Pt. performed [facility] tasks using the Grooved pegboard. Pt. worked on grasping the grooved pegs from a horizontal position, and moving the pegs to a vertical position in the hand to prepare for placing them in the grooved slot. Pt. worked worked on translatory movements moving objects through her hand from the palm to the tip of the 2nd digit and thumb. Pt. attempted 1" objects, however the task was modified to 2" objects.   Therapeutic Exercise:  Pt. worked on [employer], and reciprocal motion using the UBE in standing for 8 min. with minimal resistance. Constant monitoring was provided.  Pt. is making progress with UE functioning, and is now engaging in more tasks at home. Pt. was able to tolerate the UBE with minimal resistance without rest breaks. Pt. has difficulty with [facility] skills, and translatory movements, and manipulating small objects. Pt. continues to work on improving UE strength, and [facility] skills in order to be able to write her name, type an email, hold and manipulate small objects, prepare a meal, and perform household tasks.                       OT Education - 04/09/21 1655    Education Details IADLs    Person(s) Educated Patient;Child(ren)    Methods  Explanation;Demonstration    Comprehension Verbalized understanding;Returned demonstration               OT Long Term Goals - 04/09/21 1656      OT LONG TERM GOAL #1   Title Pt. will be able to independently sign her name with 100% legibility.    Baseline Pt. continues to present with limited signature. Eval: Signature not legible, however was able to sign on the line with no deviation above, or below the line.    Time 12    Period Weeks    Status On-going    Target Date 05/13/21      OT LONG TERM GOAL #2   Title Pt. will increase right shoulder AROM to be able to independently retrieve items from closets, and cabinets.     Baseline Pt. has improved with AROM, and is now able to retrieve items from closets, and cabinets. Eval: Pt. presents with limited ROM.    Time 12    Period Weeks    Status Partially Met    Target Date 05/13/21      OT LONG TERM GOAL #3   Title Pt. will increase right grip strength by 5# to be able hold a full glass    Baseline Pt. is improving with formulating a composite fist. Eval: limited grip strength. Limited bilateral digit flexion, unable to make a full composite fist.    Time 12    Period Weeks    Status On-going    Target Date 05/13/21      OT LONG TERM GOAL #4   Title Pt. will independently access a computer, and type a one sentence email correspondence.    Baseline Pt. is able to type a one sentence correspondence with multiple mistypes. Eval: Pt. is unable to to perform    Time 12    Period Weeks    Status On-going    Target Date 05/13/21      OT LONG TERM GOAL #5   Title Pt. will improve bilateral [facility] skills to be able to grasp, and manipulate small objects during ADLs.    Baseline Pt. continues to have difficulty grasping,a nd manipulating small objects.Eval: Pt. presents with limited [facility] skills with difficulty picking,a nd manipulating small objects.    Time 12    Period Weeks    Status On-going    Target Date 05/13/21      OT LONG TERM GOAL #6   Title Pt. will improve UE strength by 2 mm grades to assist with ADLs, and IADLS,    Baseline Pt. continues to present with limited BUE stregth. Pt. is engaging in more ADL,a nd IADL tasks at home. Eval: pt. has difficulty performing.    Time 12    Period Weeks    Status On-going    Target Date 05/13/21      OT LONG TERM GOAL #7   Title Pt. will complete light meal preparation with minA    Baseline Pt. is preparing meals with her daughter's assist. Eval: Pt. is unable to prepare light meal preparation.    Time 12    Period Weeks    Status On-going    Target Date 05/13/21      OT LONG TERM GOAL #8   Title Pt.  will perform light home management tasks with minA    Baseline Pt. is engaging in more home management tasks. Eval: pt. is unable to perform    Time 12    Period Weeks  Status On-going    Target Date 05/13/21                 Plan - 04/09/21 1656    Clinical Impression Statement Pt. is making progress with UE functioning, and is now engaging in more tasks at home. Pt. was able to tolerate the UBE with minimal resistance without rest breaks. Pt. has difficulty with [facility] skills, and translatory movements, and manipulating small objects. Pt. continues to work on improving UE strength, and [facility] skills in order to be able to write her name, type an email, hold and manipulate small objects, prepare a meal, and perform household tasks.    OT Occupational Profile and History Problem Focused Assessment - Including review of records relating to presenting problem    Occupational performance deficits (Please refer to evaluation for details): ADL's;IADL's    Body Structure / Function / Physical Skills ADL;IADL;FMC;ROM;Strength    Rehab Potential Good    Clinical Decision Making Several treatment options, min-mod task modification necessary    Comorbidities Affecting Occupational Performance: May have comorbidities impacting occupational performance    Modification or Assistance to Complete Evaluation  Min-Moderate modification of tasks or assist with assess necessary to complete eval    OT Frequency 2x / week    OT Duration 12 weeks    OT Treatment/Interventions Self-care/ADL training;Neuromuscular education;Therapeutic activities;Patient/family education;DME and/or AE instruction;Visual/perceptual remediation/compensation    Consulted and Agree with Plan of Care Patient           Patient will benefit from skilled therapeutic intervention in order to improve the following deficits and impairments:   Body Structure / Function / Physical Skills: ADL,IADL,FMC,ROM,Strength       Visit  Diagnosis: Muscle weakness (generalized)  Other lack of coordination    Problem List Patient Active Problem List   Diagnosis Date Noted  . Multiple lung nodules 03/27/2021  . Dysarthria and anarthria 02/11/2021  . Sinus tachycardia   . Labile blood glucose   . Dysphagia   . Seizure prophylaxis   . Malnutrition of moderate degree 01/23/2021  . Anoxic brain injury ([place]) 01/10/2021  . Prediabetes   . Dyslipidemia   . S/P percutaneous endoscopic gastrostomy (PEG) tube placement ([place])   . Status post tracheostomy ([place])   . Aspiration pneumonia of both lower lobes due to gastric secretions ([place]) 01/05/2021  . Acute respiratory failure ([place])   . Hypoxic ischemic encephalopathy   . Endotracheal tube present   . Cardiac arrest ([place]) 12/02/2020  . Essential hypertension 12/09/2011    Harrel Carina, MS, OTR/L 04/09/2021, 5:09 PM  [place] MAIN [facility] SERVICES [address] [place], [place], [address] Phone: [phone]   Fax:  [phone]  Name: [name] [name] MRN: [id number] Date of Birth: October 01, 1966

## 2021-04-09 NOTE — Patient Instructions (Signed)
Given severity of cognitive deficits, pt is not safe to drive until cleared by her physician

## 2021-04-09 NOTE — Therapy (Signed)
[place] MAIN [facility] SERVICES [address] [place], [place], [address] Phone: [phone]   Fax:  [phone]  Speech Language Pathology Treatment  Patient Details  Name: [name] [name] MRN: [id number] Date of Birth: 02/25/1966 Referring Provider (SLP): Marlowe Shores, [place]   Encounter Date: 04/07/2021   End of Session - 04/09/21 1213    Visit Number 13    Number of Visits 25    Date for SLP Re-Evaluation 05/06/21    Authorization Type Medicaid    Authorization Time Period 02/11/2021 thru 05/06/2021    Authorization - Visit Number 3    Progress Note Due on Visit 10    SLP Start Time 1500    SLP Stop Time  1600    SLP Time Calculation (min) 60 min    Activity Tolerance Patient tolerated treatment well           Past Medical History:  Diagnosis Date  . Hypertension   . Seizures ([place])     Past Surgical History:  Procedure Laterality Date  . PEG PLACEMENT N/A 12/13/2020   Procedure: PERCUTANEOUS ENDOSCOPIC GASTROSTOMY (PEG) PLACEMENT;  Surgeon: Lesly Rubenstein, MD;  Location: [facility] ENDOSCOPY;  Service: Endoscopy;  Laterality: N/A;  . TRACHEOSTOMY TUBE PLACEMENT N/A 12/13/2020   Procedure: TRACHEOSTOMY;  Surgeon: Clyde Canterbury, MD;  Location: [facility] ORS;  Service: ENT;  Laterality: N/A;    There were no vitals filed for this visit.   Subjective Assessment - 04/09/21 1210    Subjective pt forgot her glasses, accompanied by her son, became emotional when discussing driving    Patient is accompained by: Family member    Currently in Pain? No/denies                 ADULT SLP TREATMENT - 04/09/21 0001      Cognitive-Linquistic Treatment   Skilled Treatment Cognitive Linguistic Quick Test was administered see scores below          AGE - 18 - 69   Cognitive Linguistic Quick Test   The Cognitive Linguistic Quick Test (CLQT) was administered to assess the relative status of five cognitive domains: attention, memory,  language, executive functioning, and visuospatial skills. Scores from 10 tasks were used to estimate severity ratings (standardized for age groups 18-69 years and 70-89 years) for each domain, a clock drawing task, as well as an overall composite severity rating of cognition.       Task Score Criterion Cut Scores  Personal Facts 6/8 8  Symbol Cancellation 5/12 11  Confrontation Naming 7/10 10  Clock Drawing  7/13 12  Story Retelling 4/10 6  Symbol Trails 1/10 9  Generative Naming 2/9 5  Design Memory 3/6 5  [place]az[place]  0/8 7  Design Generation 3/13 6    Cognitive Domain Composite Score Severity Rating  Attention 65/215 Severe  Memory 98/185 Severe  Executive Function 8/40 Severe  Language 19/37 Severe  Visuospatial Skills 31/105 Severe  Clock Drawing  7/13 Severe  Composite Severity Rating  Severe           SLP Education - 04/09/21 1212    Education Details pt needs to be cleared by physician before driving, complete orientation information everyday for increase recall    Person(s) Educated Patient;Child(ren)    Methods Explanation;Demonstration;Verbal cues;Handout    Comprehension Verbalized understanding            SLP Short Term Goals - 03/20/21 0941      SLP SHORT TERM  GOAL #1   Title Pt will independently demonstrate selective attention in mildly distracting environment for 15 minutes.    Baseline goal met (03/20/2021); upgraded    Time 10    Period --   sessions   Status Revised      SLP SHORT TERM GOAL #2   Title With moderate cues, pt will utilize speech intelligibility strategies to achieve > 90% intelligibility at the phrase level.    Baseline Goal not met in time period. Goal downgraded from minimal cues to moderate cues.    Time 10    Period --   sessions   Status Revised      SLP SHORT TERM GOAL #3   Title With moderate cues, pt will utilize compensatory memory strategies to recall therapy sessions for week as well as order of therapy in 8 out of 10  sessions.    Baseline goal met (03/20/2021); goal updated    Time 10    Period --   sessions   Status Revised      SLP SHORT TERM GOAL #4   Title With moderate assistance, pt will complete basic problem solving tasks with >95% accuracy.    Baseline Goal met (03/20/2021); goal updated    Time 10    Period --   sessions   Status Revised      SLP SHORT TERM GOAL #5   Title Pt will consume current diet without any reports s/s of dysphagia by pt or family members.    Status Achieved            SLP Long Term Goals - 03/22/21 1447      SLP LONG TERM GOAL #1   Title Pt will consume safest, least restrictive diet without overt s/s of dysphagia, aspiration or respiratory distress.    Status Achieved      SLP LONG TERM GOAL #2   Title Pt will independently complete functional problem solving for basic and familiar tasks.    Status On-going      SLP LONG TERM GOAL #3   Title Pt will demonstrate selective attention in a moderately distracting environment for 15 minutes with < 1 redirection to task.    Status On-going      SLP LONG TERM GOAL #4   Title With minimal cues, pt will increase speech intelligibility to > 75% at the sentence level.    Status On-going            Plan - 04/09/21 1213    Clinical Impression Statement Pt arrived to session frustrated, "he wouldn't let me drive" pointing to her son. They report that pt has been driving with her husband in the vehicle with her. Extensive education provided on the dangers associated with driving in light of severe cognitive deficits and fact that pt has not been cleared by physician to drive.  In addition to pt's dysarthria, he son reports that prior to brain injury, pt was more serious especially when discussing matters pertaining to therapy or forgetting her glasses. As such, her current personality appears very euphoric. The Cognitive Linguistic Quick Test was administered with severe scores in all areas of cognitive function.  Throughout the assessment, pt was impulsive and lacked emergent awareness. Finally, pt was not able to recall correct oriented information d/t fact that she now completes orientation work "somewhat." she stated that the date was May 9th, 2020 and was not able to recall location. She stated that she was 36 and when cued she  stated correct age of "89, because I am going to be 2 this year." Skilled ST remains necessary to target pt's severe impairments in cognitive communication to increase safe functional independence and reduce caregiver burden.    Speech Therapy Frequency 2x / week    Duration 12 weeks    Treatment/Interventions Diet toleration management by SLP;Cognitive reorganization;Compensatory techniques;Internal/external aids;SLP instruction and feedback;Patient/family education;Functional tasks;Compensatory strategies    Potential to Achieve Goals Fair    Potential Considerations Severity of impairments;Ability to learn/carryover information    SLP Home Exercise Plan provided    Consulted and Agree with Plan of Care Patient;Family member/caregiver    Family Member Consulted pt's son           Patient will benefit from skilled therapeutic intervention in order to improve the following deficits and impairments:   Cognitive communication deficit  Anoxic brain injury [facility])    Problem List Patient Active Problem List   Diagnosis Date Noted  . Multiple lung nodules 03/27/2021  . Dysarthria and anarthria 02/11/2021  . Sinus tachycardia   . Labile blood glucose   . Dysphagia   . Seizure prophylaxis   . Malnutrition of moderate degree 01/23/2021  . Anoxic brain injury ([place]) 01/10/2021  . Prediabetes   . Dyslipidemia   . S/P percutaneous endoscopic gastrostomy (PEG) tube placement ([place])   . Status post tracheostomy ([place])   . Aspiration pneumonia of both lower lobes due to gastric secretions ([place]) 01/05/2021  . Acute respiratory failure ([place])   . Hypoxic ischemic encephalopathy    . Endotracheal tube present   . Cardiac arrest ([place]) 12/02/2020  . Essential hypertension 12/09/2011   Jessalynn Mccowan B. Rutherford Nail M.S., CCC-SLP, [place] Pathologist Rehabilitation Services Office [phone]  Stormy Fabian 04/09/2021, 12:22 PM  [place] MAIN [facility] SERVICES [address] [place], [place], [address] Phone: [phone]   Fax:  [phone]   Name: LARI LINSON MRN: [id number] Date of Birth: 11-05-1966

## 2021-04-11 NOTE — Patient Instructions (Signed)
Write more information on her calendar, place items in visible places to help with recall and location of items

## 2021-04-11 NOTE — Therapy (Signed)
[place] MAIN [facility] SERVICES [address] [place], [place], [address] Phone: [phone]   Fax:  [phone]  Speech Language Pathology Treatment  Patient Details  Name: [name] [name] MRN: [id number] Date of Birth: 07-Nov-1966 Referring Provider (SLP): Marlowe [place], [place]   Encounter Date: 04/09/2021   End of Session - 04/11/21 1257    Visit Number 14    Number of Visits 25    Date for SLP Re-Evaluation 05/06/21    Authorization Type Medicaid    Authorization Time Period 02/11/2021 thru 05/06/2021    Authorization - Visit Number 4    Progress Note Due on Visit 10    SLP Start Time 1500    SLP Stop Time  1600    SLP Time Calculation (min) 60 min    Activity Tolerance Patient tolerated treatment well           Past Medical History:  Diagnosis Date  . Hypertension   . Seizures ([place])     Past Surgical History:  Procedure Laterality Date  . PEG PLACEMENT N/A 12/13/2020   Procedure: PERCUTANEOUS ENDOSCOPIC GASTROSTOMY (PEG) PLACEMENT;  Surgeon: Lesly Rubenstein, MD;  Location: [facility] ENDOSCOPY;  Service: Endoscopy;  Laterality: N/A;  . TRACHEOSTOMY TUBE PLACEMENT N/A 12/13/2020   Procedure: TRACHEOSTOMY;  Surgeon: Clyde Canterbury, MD;  Location: [facility] ORS;  Service: ENT;  Laterality: N/A;    There were no vitals filed for this visit.   Subjective Assessment - 04/11/21 1255    Subjective forgot her glasses    Patient is accompained by: Family member    Currently in Pain? No/denies                 ADULT SLP TREATMENT - 04/11/21 0001      Cognitive-Linquistic Treatment   Skilled Treatment MMQ administered (see below for results); Instructed pt in specific information to write in her calendar such as information regarding her husband's out of town jobs, locations to place items at home to increase her independence with bringing items with her            SLP Education - 04/11/21 1257    Education Details external  compensatory memory strategies    Person(s) Educated Patient;Spouse    Methods Explanation;Demonstration;Verbal cues;Tactile cues;Handout    Comprehension Verbalized understanding;Need further instruction;Verbal cues required;Tactile cues required            SLP Short Term Goals - 03/20/21 0941      SLP SHORT TERM GOAL #1   Title Pt will independently demonstrate selective attention in mildly distracting environment for 15 minutes.    Baseline goal met (03/20/2021); upgraded    Time 10    Period --   sessions   Status Revised      SLP SHORT TERM GOAL #2   Title With moderate cues, pt will utilize speech intelligibility strategies to achieve > 90% intelligibility at the phrase level.    Baseline Goal not met in time period. Goal downgraded from minimal cues to moderate cues.    Time 10    Period --   sessions   Status Revised      SLP SHORT TERM GOAL #3   Title With moderate cues, pt will utilize compensatory memory strategies to recall therapy sessions for week as well as order of therapy in 8 out of 10 sessions.    Baseline goal met (03/20/2021); goal updated    Time 10    Period --  sessions   Status Revised      SLP SHORT TERM GOAL #4   Title With moderate assistance, pt will complete basic problem solving tasks with >95% accuracy.    Baseline Goal met (03/20/2021); goal updated    Time 10    Period --   sessions   Status Revised      SLP SHORT TERM GOAL #5   Title Pt will consume current diet without any reports s/s of dysphagia by pt or family members.    Status Achieved            SLP Long Term Goals - 03/22/21 1447      SLP LONG TERM GOAL #1   Title Pt will consume safest, least restrictive diet without overt s/s of dysphagia, aspiration or respiratory distress.    Status Achieved      SLP LONG TERM GOAL #2   Title Pt will independently complete functional problem solving for basic and familiar tasks.    Status On-going      SLP LONG TERM GOAL #3   Title  Pt will demonstrate selective attention in a moderately distracting environment for 15 minutes with < 1 redirection to task.    Status On-going      SLP LONG TERM GOAL #4   Title With minimal cues, pt will increase speech intelligibility to > 75% at the sentence level.    Status On-going            Plan - 04/11/21 1257    Clinical Impression Statement MULTIFACTORIAL MEMORY QUESTIONNAIRE (MMQ)   The Multifactorial Memory Questionnaire (MMQ) consists of three scales measuring separate aspects of metamemory; Satisfaction, Ability and Strategy. Pt's responses are converted to T-Scores with severity levels based on pt's T-Score.   Pt reports:    VERY LOW Memory Satisfaction (T-score: 23)   LOW Memory Ability (T-score: 26)   BELOW AVERAGE use of Memory Strategies (T-score: 36)   Pt presents with severe memory impairment that impacts all cognitive functions as well as severe dysarthria d/t cognitive deficits. Pt's scores on the MMQ revealed severe dissatisfaction with memory, low memory ability and below average use of memory strategies. Skilled ST remains necessary to target there impairments in memory as well as pt's expressive abilities in order to increase pt's functional independence and ability to participate in family/community/leisure activities thereby reducing pt's caregiver's burden.    Speech Therapy Frequency 2x / week    Duration 12 weeks    Treatment/Interventions Diet toleration management by SLP;Cognitive reorganization;Compensatory techniques;Internal/external aids;SLP instruction and feedback;Patient/family education;Functional tasks;Compensatory strategies    Potential to Achieve Goals Fair    Potential Considerations Severity of impairments;Ability to learn/carryover information    SLP Home Exercise Plan provided, see instruction section    Consulted and Agree with Plan of Care Patient;Family member/caregiver    Family Member Consulted pt's husband            Patient will benefit from skilled therapeutic intervention in order to improve the following deficits and impairments:   Cognitive communication deficit  Anoxic brain injury [facility])    Problem List Patient Active Problem List   Diagnosis Date Noted  . Multiple lung nodules 03/27/2021  . Dysarthria and anarthria 02/11/2021  . Sinus tachycardia   . Labile blood glucose   . Dysphagia   . Seizure prophylaxis   . Malnutrition of moderate degree 01/23/2021  . Anoxic brain injury ([place]) 01/10/2021  . Prediabetes   . Dyslipidemia   . S/P percutaneous  endoscopic gastrostomy (PEG) tube placement ([place])   . Status post tracheostomy ([place])   . Aspiration pneumonia of both lower lobes due to gastric secretions ([place]) 01/05/2021  . Acute respiratory failure ([place])   . Hypoxic ischemic encephalopathy   . Endotracheal tube present   . Cardiac arrest ([place]) 12/02/2020  . Essential hypertension 12/09/2011   Lejla Moeser B. Rutherford Nail M.S., CCC-SLP, [place] Pathologist Rehabilitation Services Office [phone]  Stormy Fabian 04/11/2021, 1:02 PM  [place] MAIN [facility] SERVICES [address] [place], [place], [address] Phone: [phone]   Fax:  [phone]   Name: [name] [name] MRN: [id number] Date of Birth: [birth year]

## 2021-04-14 ENCOUNTER — Other Ambulatory Visit: Payer: Self-pay

## 2021-04-14 ENCOUNTER — Ambulatory Visit: Payer: Self-pay | Admitting: Occupational Therapy

## 2021-04-14 ENCOUNTER — Encounter: Payer: Self-pay | Admitting: Occupational Therapy

## 2021-04-14 ENCOUNTER — Ambulatory Visit: Payer: Self-pay | Admitting: Speech Pathology

## 2021-04-14 DIAGNOSIS — R41841 Cognitive communication deficit: Secondary | ICD-10-CM

## 2021-04-14 DIAGNOSIS — G931 Anoxic brain damage, not elsewhere classified: Secondary | ICD-10-CM

## 2021-04-14 DIAGNOSIS — M6281 Muscle weakness (generalized): Secondary | ICD-10-CM

## 2021-04-14 DIAGNOSIS — R278 Other lack of coordination: Secondary | ICD-10-CM

## 2021-04-14 NOTE — Therapy (Signed)
[place] MAIN [facility] SERVICES [address] [place], [place], [address] Phone: [phone]   Fax:  [phone]  Speech Language Pathology Treatment  Patient Details  Name: [name] [name] MRN: [id number] Date of Birth: [birth year] Referring Provider (SLP): Marlowe [place], [place]   Encounter Date: 04/14/2021   End of Session - 04/14/21 1640    Visit Number 15    Number of Visits 25    Date for SLP Re-Evaluation 05/06/21    Authorization Type Medicaid    Authorization Time Period 02/11/2021 thru 05/06/2021    Authorization - Visit Number 5    Progress Note Due on Visit 10    SLP Start Time 1500    SLP Stop Time  1600    SLP Time Calculation (min) 60 min    Activity Tolerance Patient tolerated treatment well           Past Medical History:  Diagnosis Date  . Hypertension   . Seizures ([place])     Past Surgical History:  Procedure Laterality Date  . PEG PLACEMENT N/A 12/13/2020   Procedure: PERCUTANEOUS ENDOSCOPIC GASTROSTOMY (PEG) PLACEMENT;  Surgeon: Lesly Rubenstein, MD;  Location: [facility] ENDOSCOPY;  Service: Endoscopy;  Laterality: N/A;  . TRACHEOSTOMY TUBE PLACEMENT N/A 12/13/2020   Procedure: TRACHEOSTOMY;  Surgeon: Clyde Canterbury, MD;  Location: [facility] ORS;  Service: ENT;  Laterality: N/A;    There were no vitals filed for this visit.   Subjective Assessment - 04/14/21 1632    Subjective has new glasses    Patient is accompained by: Family member    Currently in Pain? No/denies                 ADULT SLP TREATMENT - 04/14/21 0001      Cognitive-Linquistic Treatment   Skilled Treatment PROBLEM SOLVING: [employer] Everyday Problem Solving (p6-  50% improving to 100% with multiple choice questions with moderate cues to state problem and possible solutions prior to selecting choice; minimal cues to independently generate problem and solution; Total assistance provided to add and subtract money in [employer]            SLP  Education - 04/14/21 1640    Education Details problem solving strategies    Person(s) Educated Patient;Spouse    Methods Explanation;Demonstration;Verbal cues    Comprehension Verbalized understanding;Need further instruction            SLP Short Term Goals - 03/20/21 0941      SLP SHORT TERM GOAL #1   Title Pt will independently demonstrate selective attention in mildly distracting environment for 15 minutes.    Baseline goal met (03/20/2021); upgraded    Time 10    Period --   sessions   Status Revised      SLP SHORT TERM GOAL #2   Title With moderate cues, pt will utilize speech intelligibility strategies to achieve > 90% intelligibility at the phrase level.    Baseline Goal not met in time period. Goal downgraded from minimal cues to moderate cues.    Time 10    Period --   sessions   Status Revised      SLP SHORT TERM GOAL #3   Title With moderate cues, pt will utilize compensatory memory strategies to recall therapy sessions for week as well as order of therapy in 8 out of 10 sessions.    Baseline goal met (03/20/2021); goal updated    Time 10    Period --  sessions   Status Revised      SLP SHORT TERM GOAL #4   Title With moderate assistance, pt will complete basic problem solving tasks with >95% accuracy.    Baseline Goal met (03/20/2021); goal updated    Time 10    Period --   sessions   Status Revised      SLP SHORT TERM GOAL #5   Title Pt will consume current diet without any reports s/s of dysphagia by pt or family members.    Status Achieved            SLP Long Term Goals - 03/22/21 1447      SLP LONG TERM GOAL #1   Title Pt will consume safest, least restrictive diet without overt s/s of dysphagia, aspiration or respiratory distress.    Status Achieved      SLP LONG TERM GOAL #2   Title Pt will independently complete functional problem solving for basic and familiar tasks.    Status On-going      SLP LONG TERM GOAL #3   Title Pt will demonstrate  selective attention in a moderately distracting environment for 15 minutes with < 1 redirection to task.    Status On-going      SLP LONG TERM GOAL #4   Title With minimal cues, pt will increase speech intelligibility to > 75% at the sentence level.    Status On-going            Plan - 04/14/21 1640    Clinical Impression Statement Pt presents with severe memory impairment that impacts all cognitive functions as well as severe dysarthria d/t cognitive deficits. Pt responded well to problem solving tasks with cues needed from impulsivity when reading. She demonstrated increased ability to carryover strategy. Pt brought in her calendar and had filled it out, including meal that she helped prepare, was oriented x 4, and states that she placed her purse by the front door and has not forgotten anything since then. While pt is progressing with assistance, she was not able to write appropriate information to add/subtract simplistic information for checkbook management. Skilled ST remains necessary to target there impairments in memory as well as pt's expressive abilities in order to increase pt's functional independence and ability to participate in family/community/leisure activities thereby reducing pt's caregiver's burden.    Duration 12 weeks    Treatment/Interventions Diet toleration management by SLP;Cognitive reorganization;Compensatory techniques;Internal/external aids;SLP instruction and feedback;Patient/family education;Functional tasks;Compensatory strategies    Potential to Achieve Goals Fair    Potential Considerations Severity of impairments;Ability to learn/carryover information    SLP Home Exercise Plan provided, see instruction section    Consulted and Agree with Plan of Care Patient;Family member/caregiver    Family Member Consulted pt's husband           Patient will benefit from skilled therapeutic intervention in order to improve the following deficits and impairments:    Cognitive communication deficit  Anoxic brain injury [facility])    Problem List Patient Active Problem List   Diagnosis Date Noted  . Multiple lung nodules 03/27/2021  . Dysarthria and anarthria 02/11/2021  . Sinus tachycardia   . Labile blood glucose   . Dysphagia   . Seizure prophylaxis   . Malnutrition of moderate degree 01/23/2021  . Anoxic brain injury ([place]) 01/10/2021  . Prediabetes   . Dyslipidemia   . S/P percutaneous endoscopic gastrostomy (PEG) tube placement ([place])   . Status post tracheostomy ([place])   . Aspiration pneumonia  of both lower lobes due to gastric secretions ([place]) 01/05/2021  . Acute respiratory failure ([place])   . Hypoxic ischemic encephalopathy   . Endotracheal tube present   . Cardiac arrest ([place]) 12/02/2020  . Essential hypertension 12/09/2011   Anndee Connett B. Rutherford Nail M.S., CCC-SLP, [place] Pathologist Rehabilitation Services Office [phone]  Stormy Fabian 04/14/2021, 4:42 PM  [place] MAIN [facility] SERVICES [address] [place], [place], [address] Phone: [phone]   Fax:  [phone]   Name: [name] [name] MRN: [id number] Date of Birth: 1966/05/19

## 2021-04-14 NOTE — Therapy (Signed)
[place] MAIN [facility] SERVICES [address] [place], [place], [address] Phone: [phone]   Fax:  [phone]  Occupational Therapy Treatment  Patient Details  Name: [name] [name] MRN: [id number] Date of Birth: [birth year] No data recorded  Encounter Date: 04/14/2021   OT End of Session - 04/14/21 1724    Visit Number 11    Number of Visits 24    Date for OT Re-Evaluation 05/13/21    Authorization Type Progress reports period starting 02/11/2021    OT Start Time 1607    OT Stop Time 1650    OT Time Calculation (min) 43 min    Activity Tolerance Patient tolerated treatment well    Behavior During Therapy [facility] for tasks assessed/performed           Past Medical History:  Diagnosis Date  . Hypertension   . Seizures ([place])     Past Surgical History:  Procedure Laterality Date  . PEG PLACEMENT N/A 12/13/2020   Procedure: PERCUTANEOUS ENDOSCOPIC GASTROSTOMY (PEG) PLACEMENT;  Surgeon: Lesly Rubenstein, MD;  Location: [facility] ENDOSCOPY;  Service: Endoscopy;  Laterality: N/A;  . TRACHEOSTOMY TUBE PLACEMENT N/A 12/13/2020   Procedure: TRACHEOSTOMY;  Surgeon: Clyde Canterbury, MD;  Location: [facility] ORS;  Service: ENT;  Laterality: N/A;    There were no vitals filed for this visit.   Subjective Assessment - 04/14/21 1723    Subjective  Pt. reports doing well today.    Patient is accompanied by: Family member    Pertinent History Pt. is a 55 y.o. female who went into cardiac arrest, and was admitted to [facility] on 12/02/2020. Pt. was diagnosed with Anoxic Brain Injury following the Cardiac Arrest. When medically stable, the pt. was tranferred to [facility]. The patient was discharged home on 02/08/2021, and is ready to begin outpatient OT services. Pt. was independent, and working prior to the onset. Pt. Has very supportive family. Pt. Enjoys gardening.    Currently in Pain? No/denies          OT TREATMENT    Neuro muscular  re-education:  Pt. worked on [facility] skills using the [employer] Task. Pt. worked on sustaining grasp on the resistive tweezers while grasping this sticks, and  removing them from a vertical position on the Pegboard. Pt. Required verbal cues, and cues for visual demonstration for wrist position, and hand pattern when placing them into the pegboard.  Therapeutic Exercise:  Pt. Worked on BUE strengthening, and reciprocal motion using the UBE in standing for 8 min. with no resistance. Constant monitoring was provided. Pt. performed gross gripping with grip strengthener. Pt. worked on sustaining grip while grasping pegs and reaching at various heights. The gripper was set at 11.2# of resistive force.  Selfcare:  Pt. worked on Media planner. Pt. was able to formulate sentences in printed form with 50% legibility initially with positive deviation from the line, and decreased spacing between the words.  Pt. is making progress. Pt. was able to write sentences multiple times with 50-75% legibility in printed form. Pt. Required verbal cues, tactile cues, and cues for visual demonstration when using the gripper, and tweezers. Pt. Continues to work on improving UE strength, and [facility] skills in order to work towards improving, and maximizing independence with ADLs, and IADL tasks.                         OT Education - 04/14/21 1723  Education Details UE strength, [place], writing.    Person(s) Educated Patient;Child(ren)    Methods Explanation;Demonstration    Comprehension Verbalized understanding;Returned demonstration               OT Long Term Goals - 04/09/21 1656      OT LONG TERM GOAL #1   Title Pt. will be able to independently sign her name with 100% legibility.    Baseline Pt. continues to present with limited signature. Eval: Signature not legible, however was able to sign on the line with no deviation above, or below the line.    Time 12    Period  Weeks    Status On-going    Target Date 05/13/21      OT LONG TERM GOAL #2   Title Pt. will increase right shoulder AROM to be able to independently retrieve items from closets, and cabinets.    Baseline Pt. has improved with AROM, and is now able to retrieve items from closets, and cabinets. Eval: Pt. presents with limited ROM.    Time 12    Period Weeks    Status Partially Met    Target Date 05/13/21      OT LONG TERM GOAL #3   Title Pt. will increase right grip strength by 5# to be able hold a full glass    Baseline Pt. is improving with formulating a composite fist. Eval: limited grip strength. Limited bilateral digit flexion, unable to make a full composite fist.    Time 12    Period Weeks    Status On-going    Target Date 05/13/21      OT LONG TERM GOAL #4   Title Pt. will independently access a computer, and type a one sentence email correspondence.    Baseline Pt. is able to type a one sentence correspondence with multiple mistypes. Eval: Pt. is unable to to perform    Time 12    Period Weeks    Status On-going    Target Date 05/13/21      OT LONG TERM GOAL #5   Title Pt. will improve bilateral [facility] skills to be able to grasp, and manipulate small objects during ADLs.    Baseline Pt. continues to have difficulty grasping,a nd manipulating small objects.Eval: Pt. presents with limited [facility] skills with difficulty picking,a nd manipulating small objects.    Time 12    Period Weeks    Status On-going    Target Date 05/13/21      OT LONG TERM GOAL #6   Title Pt. will improve UE strength by 2 mm grades to assist with ADLs, and IADLS,    Baseline Pt. continues to present with limited BUE stregth. Pt. is engaging in more ADL,a nd IADL tasks at home. Eval: pt. has difficulty performing.    Time 12    Period Weeks    Status On-going    Target Date 05/13/21      OT LONG TERM GOAL #7   Title Pt. will complete light meal preparation with minA    Baseline Pt. is preparing meals  with her daughter's assist. Eval: Pt. is unable to prepare light meal preparation.    Time 12    Period Weeks    Status On-going    Target Date 05/13/21      OT LONG TERM GOAL #8   Title Pt. will perform light home management tasks with minA    Baseline Pt. is engaging in more home management tasks.  Eval: pt. is unable to perform    Time 12    Period Weeks    Status On-going    Target Date 05/13/21                 Plan - 04/14/21 1724    Clinical Impression Statement Pt. is making progress. Pt. was able to write sentences multiple times with 50-75% legibility in printed form. Pt. Required verbal cues, tactile cues, and cues for visual demonstration when using the gripper, and tweezers. Pt. Continues to work on improving UE strength, and [facility] skills in order to work towards improving, and maximizing independence with ADLs, and IADL tasks.   OT Occupational Profile and History Problem Focused Assessment - Including review of records relating to presenting problem    Occupational performance deficits (Please refer to evaluation for details): ADL's;IADL's    Body Structure / Function / Physical Skills ADL;IADL;FMC;ROM;Strength    Rehab Potential Good    Clinical Decision Making Several treatment options, min-mod task modification necessary    Comorbidities Affecting Occupational Performance: May have comorbidities impacting occupational performance    Modification or Assistance to Complete Evaluation  Min-Moderate modification of tasks or assist with assess necessary to complete eval    OT Frequency 2x / week    OT Duration 12 weeks    OT Treatment/Interventions Self-care/ADL training;Neuromuscular education;Therapeutic activities;Patient/family education;DME and/or AE instruction;Visual/perceptual remediation/compensation    Consulted and Agree with Plan of Care Patient           Patient will benefit from skilled therapeutic intervention in order to improve the following deficits  and impairments:   Body Structure / Function / Physical Skills: ADL,IADL,FMC,ROM,Strength       Visit Diagnosis: Muscle weakness (generalized)  Other lack of coordination    Problem List Patient Active Problem List   Diagnosis Date Noted  . Multiple lung nodules 03/27/2021  . Dysarthria and anarthria 02/11/2021  . Sinus tachycardia   . Labile blood glucose   . Dysphagia   . Seizure prophylaxis   . Malnutrition of moderate degree 01/23/2021  . Anoxic brain injury ([place]) 01/10/2021  . Prediabetes   . Dyslipidemia   . S/P percutaneous endoscopic gastrostomy (PEG) tube placement ([place])   . Status post tracheostomy ([place])   . Aspiration pneumonia of both lower lobes due to gastric secretions ([place]) 01/05/2021  . Acute respiratory failure ([employer])   . Hypoxic ischemic encephalopathy   . Endotracheal tube present   . Cardiac arrest ([place]) 12/02/2020  . Essential hypertension 12/09/2011    Harrel Carina, MS, OTR/L 04/14/2021, 5:27 PM  [place] MAIN [facility] SERVICES [address] [place], [place], [address] Phone: [phone]   Fax:  [phone]  Name: [name] [name] MRN: [id number] Date of Birth: 01/29/1966

## 2021-04-15 ENCOUNTER — Encounter: Payer: Self-pay | Admitting: Cardiovascular Disease

## 2021-04-15 ENCOUNTER — Encounter: Payer: Self-pay | Admitting: Physical Medicine & Rehabilitation

## 2021-04-15 ENCOUNTER — Ambulatory Visit (INDEPENDENT_AMBULATORY_CARE_PROVIDER_SITE_OTHER): Payer: Self-pay | Admitting: Cardiovascular Disease

## 2021-04-15 VITALS — BP 124/84 | HR 70 | Ht 64.0 in | Wt 180.2 lb

## 2021-04-15 DIAGNOSIS — I4901 Ventricular fibrillation: Secondary | ICD-10-CM

## 2021-04-15 DIAGNOSIS — E1310 Other specified diabetes mellitus with ketoacidosis without coma: Secondary | ICD-10-CM

## 2021-04-15 DIAGNOSIS — Z8639 Personal history of other endocrine, nutritional and metabolic disease: Secondary | ICD-10-CM

## 2021-04-15 DIAGNOSIS — I469 Cardiac arrest, cause unspecified: Secondary | ICD-10-CM

## 2021-04-15 DIAGNOSIS — E782 Mixed hyperlipidemia: Secondary | ICD-10-CM

## 2021-04-15 DIAGNOSIS — I10 Essential (primary) hypertension: Secondary | ICD-10-CM

## 2021-04-15 NOTE — Patient Instructions (Addendum)
Medication Instructions:  No changes  If you need a refill on your cardiac medications before your next appointment, please call your pharmacy.    Lab work: Potassium serum   LABS WILL APPEAR ON MYCHART, ABNORMAL RESULTS WILL BE CALLED  Testing/Procedures: No new testing needed   Follow-Up:  . You will need a follow up appointment in 12 months  . Providers on your designated Care Team:   . Murray Hodgkins, NP . Christell Faith, PA-C . Marrianne Mood, PA-C  COVID-19 Vaccine Information can be found at: [email or web address].uk For questions related to vaccine distribution or appointments, please email [email or web address] or call [phone].

## 2021-04-15 NOTE — Addendum Note (Signed)
Addended by: Wynema Birch on: 04/15/2021 10:02 AM   Modules accepted: Orders

## 2021-04-15 NOTE — Addendum Note (Signed)
Addended by: Wynema Birch on: 04/15/2021 09:44 AM   Modules accepted: Orders

## 2021-04-15 NOTE — Progress Notes (Signed)
Cardiology Office Note  Date:  04/15/2021   ID:  [name] [name], DOB [birth year], MRN [id number]  PCP:  Marinda Elk, MD   Chief Complaint  Patient presents with  . PHQ-9 8 Week Follow-up    "doing well." Medications reviewed by the patient's son Coralyn Pear).     HPI:  [name] [name] is a 55 year old woman presented to the hospital December 02, 2020 with V. fib arrest, potassium 2.1  Husband at bedside provided all the history for me today,  He was scheduled to go out of town, trip was delayed, went in to see her at 5 AM, she was gurgling, apneic, he called 911 moved her to the ground started CPR,  EMT shock x2 V. fib, epi  Unclear downtime  Long hospital course past 5 weeks  Continues to battle with anoxic encephalopathy, now alert, minimally communicative, some response to stimulation, try to communicate, trach in place, on TPN  Significant right arm weakness, able to move legs, left arm  DKA with severe acidosis  She presents to establish care in the [place] office, follow-up of her cardiac arrest  Echocardiogram showing normal ejection fraction day after admission  PEG tube placed December 13, 2020, trach  December 30, 2020 transferred out of the unit  January 05, 2021 concern for aspiration pneumonia, some low-grade fever, possible aspiration event  Currently on antibiotics    For anoxic brain injury being treated with Seroquel, Keppra, Klonopin  Unasyn for antibiotics   labs last month, potassium 3.5  Cardiac CTA 02/2021 1. Normal coronary calcium score of 0. Patient is low risk for coronary events. 2. Normal coronary origin with right dominance. 3. No evidence of CAD. 4. CAD-RADS 0. Consider non-atherosclerotic causes of cardiac arrest.  Overall she feels well no complaints, active, no bit of trouble with her speech and typing on a computer otherwise feels she is at her baseline  EKG personally reviewed by myself on todays visit Shows normal sinus rhythm  rate 60 bpm no significant ST-T wave changes  PMH:   has a past medical history of Anoxic brain injury ([place]), Cardiac arrest ([place]), Cognitive communication deficit, Hyperlipidemia, Hypertension, Seizures ([place]), and Ventricular fibrillation ([employer]).  PSH:    Past Surgical History:  Procedure Laterality Date  . PEG PLACEMENT N/A 12/13/2020   Procedure: PERCUTANEOUS ENDOSCOPIC GASTROSTOMY (PEG) PLACEMENT;  Surgeon: Lesly Rubenstein, MD;  Location: [facility] ENDOSCOPY;  Service: Endoscopy;  Laterality: N/A;  . TRACHEOSTOMY TUBE PLACEMENT N/A 12/13/2020   Procedure: TRACHEOSTOMY;  Surgeon: Clyde Canterbury, MD;  Location: [facility] ORS;  Service: ENT;  Laterality: N/A;    Current Outpatient Medications  Medication Sig Dispense Refill  . amLODipine (NORVASC) 10 MG tablet Take 1 tablet (10 mg total) by mouth daily. 30 tablet 5  . atorvastatin (LIPITOR) 80 MG tablet Take 1 tablet (80 mg total) by mouth daily. 30 tablet 5  . metoprolol tartrate (LOPRESSOR) 25 MG tablet Take 1 tablet (25 mg total) by mouth 2 (two) times daily. 60 tablet 5  . potassium chloride SA (KLOR-CON M20) 20 MEQ tablet Take 2 tablets (40 mEq total) by mouth daily. 60 tablet 5  . saccharomyces boulardii (FLORASTOR) 250 MG capsule Take 1 capsule (250 mg total) by mouth 2 (two) times daily. 60 capsule 0   No current facility-administered medications for this visit.     Allergies:   Patient has no known allergies.   Social History:  The patient  reports that she quit smoking about 15 years ago.  She has never used smokeless tobacco. She reports that she does not drink alcohol and does not use drugs.   Family History:   family history includes Diabetes in her mother; Healthy in her father; Hypertension in her mother; Leukemia in her mother.    Review of Systems: Review of Systems  Constitutional: Negative.   HENT: Negative.   Respiratory: Negative.   Cardiovascular: Negative.   Gastrointestinal: Negative.   Musculoskeletal: Negative.    Neurological: Negative.   Psychiatric/Behavioral: Negative.   All other systems reviewed and are negative.   PHYSICAL EXAM: VS:  BP 124/84 (BP Location: Left Arm, Patient Position: Sitting, Cuff Size: Normal)   Pulse 70   Ht 5\' 4"  (1.626 m)   Wt 180 lb 4 oz (81.8 kg)   SpO2 98%   BMI 30.94 kg/m  , BMI Body mass index is 30.94 kg/m. GEN: Well nourished, well developed, in no acute distress HEENT: normal Neck: no JVD, carotid bruits, or masses Cardiac: RRR; no murmurs, rubs, or gallops,no edema  Respiratory:  clear to auscultation bilaterally, normal work of breathing GI: soft, nontender, nondistended, + BS MS: no deformity or atrophy Skin: warm and dry, no rash Neuro:  Strength and sensation are intact Psych: euthymic mood, full affect   Recent Labs: 01/09/2021: Magnesium 1.9; TSH 1.241 01/13/2021: ALT 18 02/03/2021: Hemoglobin 10.7; Platelets 181 02/28/2021: BUN 8; Creatinine, Ser 0.49; Potassium 3.5; Sodium 141    Lipid Panel Lab Results  Component Value Date   TRIG 78 12/11/2020      Wt Readings from Last 3 Encounters:  04/15/21 180 lb 4 oz (81.8 kg)  04/02/21 182 lb (82.6 kg)  04/02/21 181 lb (82.1 kg)       ASSESSMENT AND PLAN:  Problem List Items Addressed This Visit      Cardiology Problems   Essential hypertension   Cardiac arrest ([place]) - Primary    Other Visit Diagnoses    Mixed hyperlipidemia       Ventricular fibrillation ([place])       Secondary DM with DKA, uncontrolled ([place])         V. fib arrest  In the setting of DKA with severe acidosis, potassium 2.1, CO2 20, lactic acid 10 Anion gap 21  -Normal echocardiogram, minimally elevated troponin  Cardiac CTA since that time no coronary disease Potassium still running low 3.5 last month Reports compliance with potassium 2 of the 20 mill equivalent pills daily Recheck potassium today, if still running less than 4 we will need to increase her daily dosing   Essential hypertension  On amlodipine  metoprolol tartrate  Blood pressure stable   Anoxic brain injury  Following cardiac arrest, slow recovery  Has made good recovery   Aspiration pneumonia  Completed antibiotics, made full recovery      Total encounter time more than 25 minutes  Greater than 50% was spent in counseling and coordination of care with the patient    Signed, Esmond Plants, M.D., Ph.D. [place], [place]

## 2021-04-16 ENCOUNTER — Encounter: Payer: Self-pay | Admitting: Occupational Therapy

## 2021-04-16 ENCOUNTER — Ambulatory Visit: Payer: Self-pay | Admitting: Occupational Therapy

## 2021-04-16 ENCOUNTER — Ambulatory Visit: Payer: Self-pay | Admitting: Speech Pathology

## 2021-04-16 ENCOUNTER — Ambulatory Visit: Payer: Self-pay

## 2021-04-16 ENCOUNTER — Inpatient Hospital Stay: Payer: Self-pay

## 2021-04-16 ENCOUNTER — Other Ambulatory Visit: Payer: Self-pay

## 2021-04-16 DIAGNOSIS — R278 Other lack of coordination: Secondary | ICD-10-CM

## 2021-04-16 DIAGNOSIS — G931 Anoxic brain damage, not elsewhere classified: Secondary | ICD-10-CM

## 2021-04-16 DIAGNOSIS — M6281 Muscle weakness (generalized): Secondary | ICD-10-CM

## 2021-04-16 DIAGNOSIS — R41841 Cognitive communication deficit: Secondary | ICD-10-CM

## 2021-04-16 NOTE — Therapy (Signed)
[place] MAIN [facility] SERVICES [address] [place], [place], [address] Phone: [phone]   Fax:  [phone]  Occupational Therapy Treatment  Patient Details  Name: [name] [name] MRN: [id number] Date of Birth: 1966/06/05 No data recorded  Encounter Date: 04/16/2021   OT End of Session - 04/16/21 1645    Visit Number 12    Number of Visits 24    Date for OT Re-Evaluation 05/13/21    Authorization Type Progress reports period starting 02/11/2021    OT Start Time 1552    OT Stop Time 1635    OT Time Calculation (min) 43 min    Activity Tolerance Patient tolerated treatment well    Behavior During Therapy [facility] for tasks assessed/performed           Past Medical History:  Diagnosis Date  . Anoxic brain injury ([place])   . Cardiac arrest ([place])   . Cognitive communication deficit   . Hyperlipidemia   . Hypertension   . Seizures ([place])   . Ventricular fibrillation [facility])     Past Surgical History:  Procedure Laterality Date  . PEG PLACEMENT N/A 12/13/2020   Procedure: PERCUTANEOUS ENDOSCOPIC GASTROSTOMY (PEG) PLACEMENT;  Surgeon: Lesly Rubenstein, MD;  Location: [facility] ENDOSCOPY;  Service: Endoscopy;  Laterality: N/A;  . TRACHEOSTOMY TUBE PLACEMENT N/A 12/13/2020   Procedure: TRACHEOSTOMY;  Surgeon: Clyde Canterbury, MD;  Location: [facility] ORS;  Service: ENT;  Laterality: N/A;    There were no vitals filed for this visit.   Subjective Assessment - 04/16/21 1645    Subjective  Pt. reports doing well today.    Patient is accompanied by: Family member    Pertinent History Pt. is a 55 y.o. female who went into cardiac arrest, and was admitted to [facility] on 12/02/2020. Pt. was diagnosed with Anoxic Brain Injury following the Cardiac Arrest. When medically stable, the pt. was tranferred to [place] rehab. The patient was discharged home on 02/08/2021, and is ready to begin outpatient OT services. Pt. was independent, and working prior to the  onset. Pt. Has very supportive family. Pt. Enjoys gardening.    Currently in Pain? No/denies          OT TREATMENT    Therapeutic Exercise:  Pt. worked on [employer], and reciprocal motion using the UBE in standing for 8 min. with no resistance. Constant monitoring was provided. Pt. performed gross gripping with grip strengthener. Pt. worked on sustaining grip while grasping pegs and reaching at various heights. The gripper was set at 11.2# of resistive force.  Selfcare:  Pt. worked on Armed forces logistics/support/administrative officer sentences. Pt. utilized a standard pen. Pt. was able to formulate sentences in printed form with 50% legibility initially with positive deviation below the line, and decreased spacing between the words.   Pt. is making progress overall. Pt. was able to write multiple sentences with 50-75% legibility in printed form. Pt. required cues to increase spacing between each word in the sentence. Pt. continues to work on improving UE strength, and [facility] skills in order to work towards improving, and maximizing independence with ADLs, and IADL tasks.                             OT Education - 04/16/21 1645    Education Details UE strength, [place], writing.    Person(s) Educated Patient;Child(ren)    Methods Explanation;Demonstration    Comprehension Verbalized understanding;Returned demonstration  OT Long Term Goals - 04/09/21 1656      OT LONG TERM GOAL #1   Title Pt. will be able to independently sign her name with 100% legibility.    Baseline Pt. continues to present with limited signature. Eval: Signature not legible, however was able to sign on the line with no deviation above, or below the line.    Time 12    Period Weeks    Status On-going    Target Date 05/13/21      OT LONG TERM GOAL #2   Title Pt. will increase right shoulder AROM to be able to independently retrieve items from closets, and cabinets.     Baseline Pt. has improved with AROM, and is now able to retrieve items from closets, and cabinets. Eval: Pt. presents with limited ROM.    Time 12    Period Weeks    Status Partially Met    Target Date 05/13/21      OT LONG TERM GOAL #3   Title Pt. will increase right grip strength by 5# to be able hold a full glass    Baseline Pt. is improving with formulating a composite fist. Eval: limited grip strength. Limited bilateral digit flexion, unable to make a full composite fist.    Time 12    Period Weeks    Status On-going    Target Date 05/13/21      OT LONG TERM GOAL #4   Title Pt. will independently access a computer, and type a one sentence email correspondence.    Baseline Pt. is able to type a one sentence correspondence with multiple mistypes. Eval: Pt. is unable to to perform    Time 12    Period Weeks    Status On-going    Target Date 05/13/21      OT LONG TERM GOAL #5   Title Pt. will improve bilateral [facility] skills to be able to grasp, and manipulate small objects during ADLs.    Baseline Pt. continues to have difficulty grasping,a nd manipulating small objects.Eval: Pt. presents with limited [facility] skills with difficulty picking,a nd manipulating small objects.    Time 12    Period Weeks    Status On-going    Target Date 05/13/21      OT LONG TERM GOAL #6   Title Pt. will improve UE strength by 2 mm grades to assist with ADLs, and IADLS,    Baseline Pt. continues to present with limited BUE stregth. Pt. is engaging in more ADL,a nd IADL tasks at home. Eval: pt. has difficulty performing.    Time 12    Period Weeks    Status On-going    Target Date 05/13/21      OT LONG TERM GOAL #7   Title Pt. will complete light meal preparation with minA    Baseline Pt. is preparing meals with her daughter's assist. Eval: Pt. is unable to prepare light meal preparation.    Time 12    Period Weeks    Status On-going    Target Date 05/13/21      OT LONG TERM GOAL #8   Title Pt.  will perform light home management tasks with minA    Baseline Pt. is engaging in more home management tasks. Eval: pt. is unable to perform    Time 12    Period Weeks    Status On-going    Target Date 05/13/21  Plan - 04/16/21 1648    Clinical Impression Statement Pt. is making progress overall. Pt. was able to write multiple sentences with 50-75% legibility in printed form. Pt. required cues to increase spacing between each word in the sentence. Pt. continues to work on improving UE strength, and [facility] skills in order to work towards improving, and maximizing independence with ADLs, and IADL tasks.   OT Occupational Profile and History Problem Focused Assessment - Including review of records relating to presenting problem    Occupational performance deficits (Please refer to evaluation for details): ADL's;IADL's    Body Structure / Function / Physical Skills ADL;IADL;FMC;ROM;Strength    Rehab Potential Good    Clinical Decision Making Several treatment options, min-mod task modification necessary    Comorbidities Affecting Occupational Performance: May have comorbidities impacting occupational performance    Modification or Assistance to Complete Evaluation  Min-Moderate modification of tasks or assist with assess necessary to complete eval    OT Frequency 2x / week    OT Duration 12 weeks    OT Treatment/Interventions Self-care/ADL training;Neuromuscular education;Therapeutic activities;Patient/family education;DME and/or AE instruction;Visual/perceptual remediation/compensation    Consulted and Agree with Plan of Care Patient           Patient will benefit from skilled therapeutic intervention in order to improve the following deficits and impairments:   Body Structure / Function / Physical Skills: ADL,IADL,FMC,ROM,Strength       Visit Diagnosis: Muscle weakness (generalized)  Other lack of coordination    Problem List Patient Active Problem List    Diagnosis Date Noted  . Multiple lung nodules 03/27/2021  . Dysarthria and anarthria 02/11/2021  . Sinus tachycardia   . Labile blood glucose   . Dysphagia   . Seizure prophylaxis   . Malnutrition of moderate degree 01/23/2021  . Anoxic brain injury ([place]) 01/10/2021  . Prediabetes   . Dyslipidemia   . S/P percutaneous endoscopic gastrostomy (PEG) tube placement ([place])   . Status post tracheostomy ([place])   . Aspiration pneumonia of both lower lobes due to gastric secretions ([place]) 01/05/2021  . Acute respiratory failure ([place])   . Hypoxic ischemic encephalopathy   . Endotracheal tube present   . Cardiac arrest ([place]) 12/02/2020  . Essential hypertension 12/09/2011    Harrel Carina, MS, OTR/L 04/16/2021, 4:50 PM  [place] MAIN [facility] SERVICES [address] [place], [place], [address] Phone: [phone]   Fax:  [phone]  Name: [name] [name] MRN: [id number] Date of Birth: [birth year]

## 2021-04-17 ENCOUNTER — Ambulatory Visit
Admission: RE | Admit: 2021-04-17 | Discharge: 2021-04-17 | Disposition: A | Discharge status: Home / Self Care | Payer: Self-pay | Source: Ambulatory Visit | Attending: Oncology | Admitting: Oncology

## 2021-04-17 DIAGNOSIS — R918 Other nonspecific abnormal finding of lung field: Secondary | ICD-10-CM | POA: Insufficient documentation

## 2021-04-17 LAB — POCT I-STAT CREATININE: Creatinine, Ser: 0.4 mg/dL — ABNORMAL LOW (ref 0.44–1.00)

## 2021-04-17 IMAGING — CT CT ANGIO CHEST
2 of 7 series · 15 of 36 positions shown · IV contrast (omnipaque)
Comparison: PET-CT from [DATE], CT heart from [DATE]

CLINICAL DATA: 54-year-old female with history of incidental
pulmonary nodules identified on recent chest CT, not significantly
hypermetabolic on follow-up PET-CT. There is suspicion based on
morphology that these nodules represent pulmonary artery aneurysms,
therefore CTPA requested.

EXAM:
CT ANGIOGRAPHY CHEST WITH CONTRAST
TECHNIQUE: Multidetector CT imaging of the chest was performed using the
standard protocol during bolus administration of intravenous
contrast. Multiplanar CT image reconstructions and MIPs were
obtained to evaluate the vascular anatomy.
CONTRAST:  Seventy-five mL Omnipaque 350, intravenous

[Series 5: thins cta pulmonary 1.00 · axial · 0.65mm/px · z∈[-1183,-927]mm · 14 of 370 slices shown]
[im 25/370  lung]
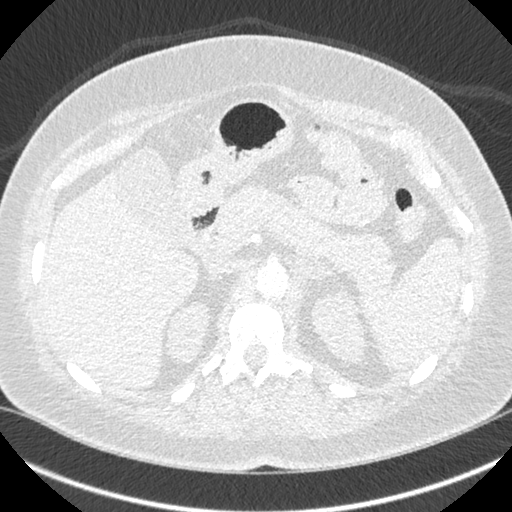
[im 50/370  mediastinal]
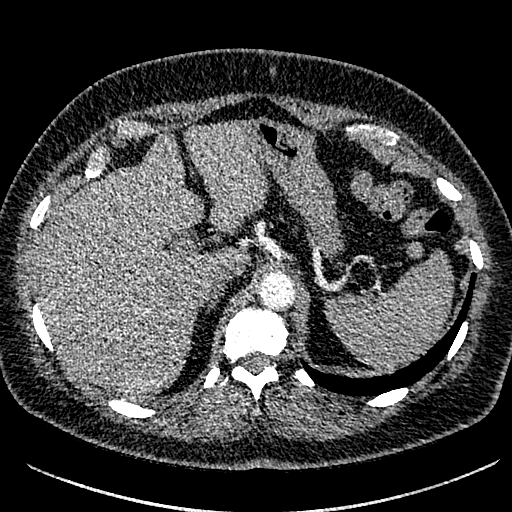
[im 74/370  lung]
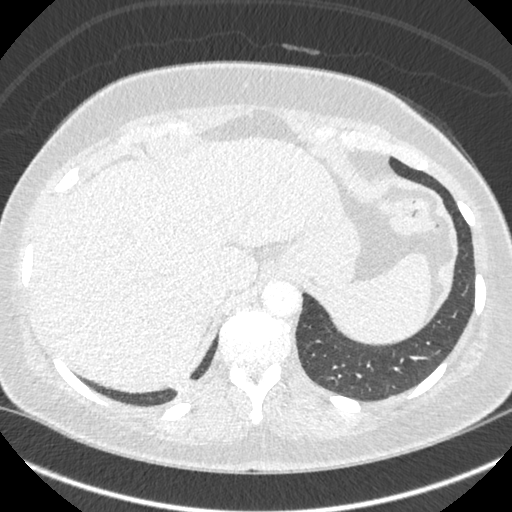
[im 99/370  mediastinal]
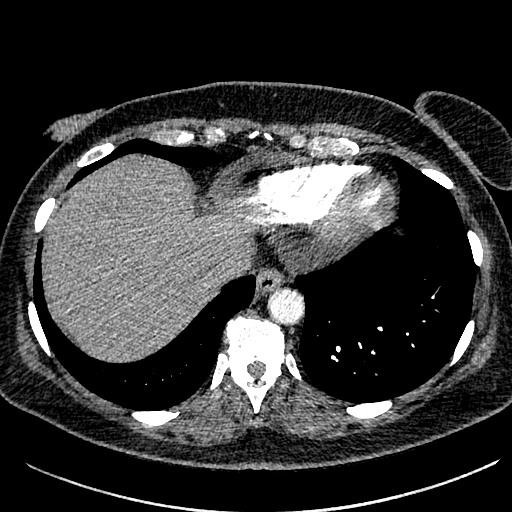
[im 124/370  lung]
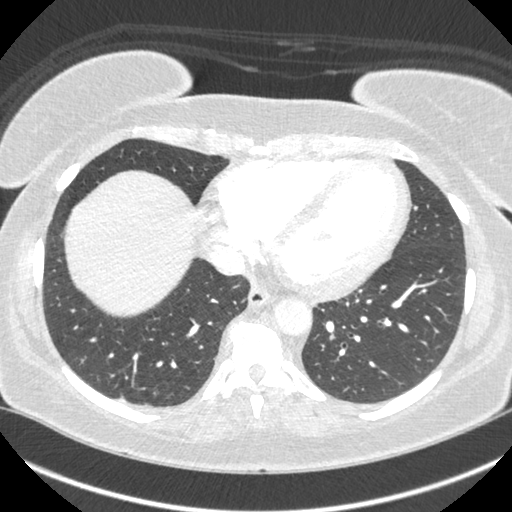
[im 148/370  mediastinal]
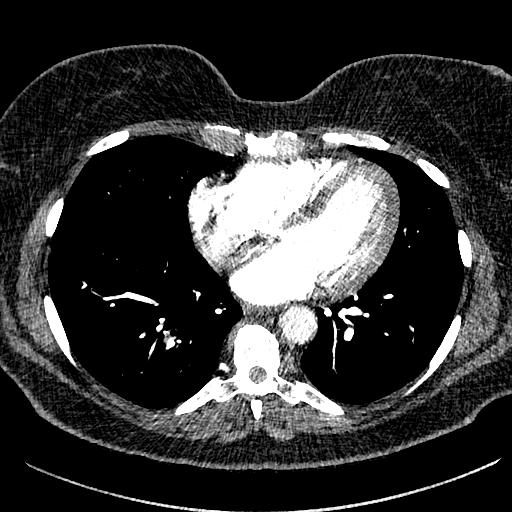
[im 173/370  lung]
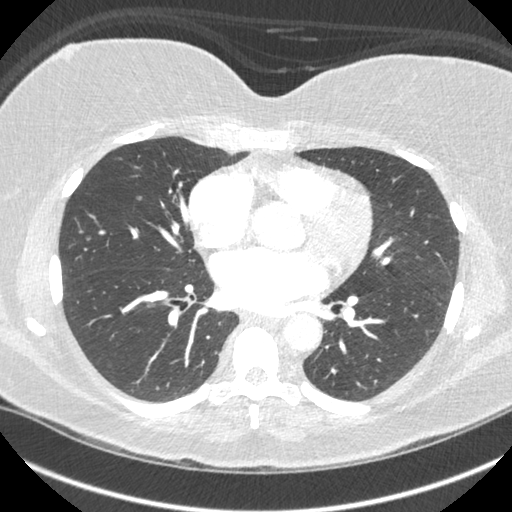
[im 197/370  mediastinal]
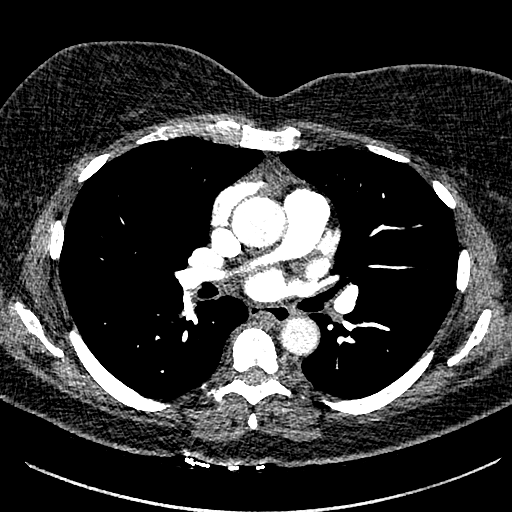
[im 222/370  lung]
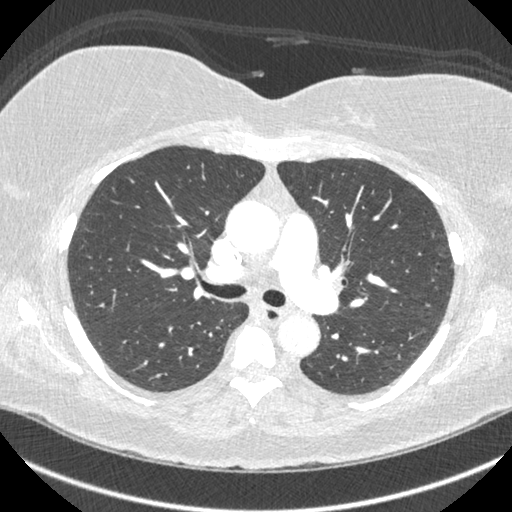
[im 247/370  mediastinal]
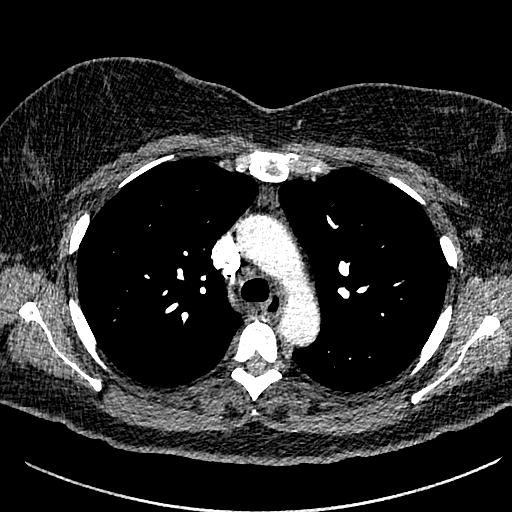
[im 271/370  lung]
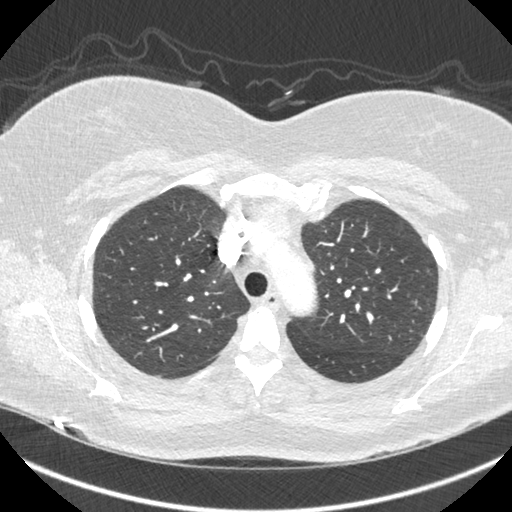
[im 296/370  mediastinal]
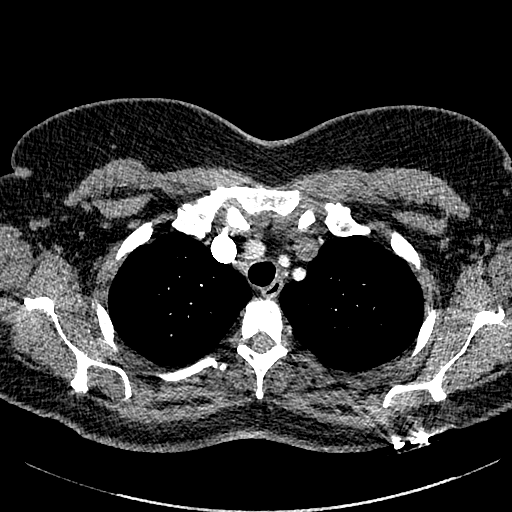
[im 320/370  lung]
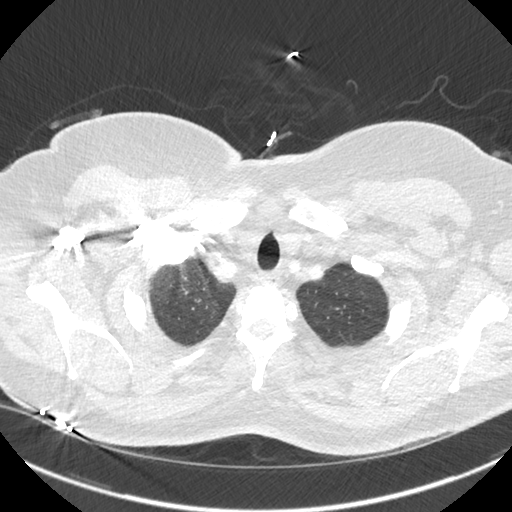
[im 345/370  mediastinal]
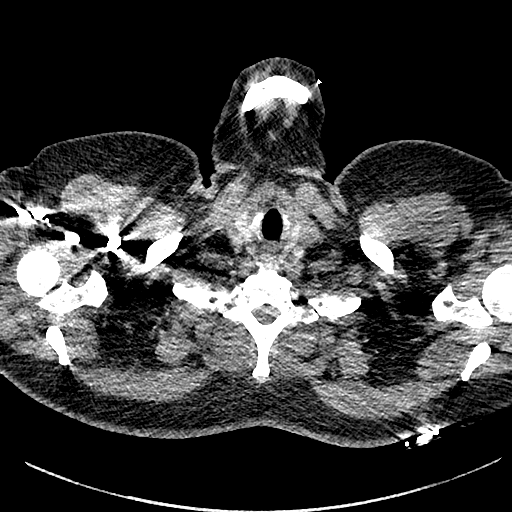

[Series 7: cta pulmonary 2.00 cor · coronal · 0.58mm/px · 1 of 140 slices shown]
[im 70/140  mediastinal]
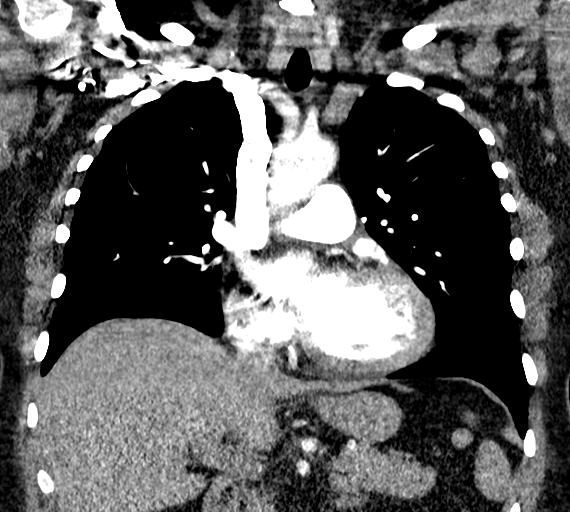

[15 of 36 positions shown; findings below may reference images not displayed]

FINDINGS: Cardiovascular: Satisfactory opacification of the pulmonary arteries
to the segmental level. No evidence of pulmonary embolism. Normal
heart size. No pericardial effusion.

Mediastinum/Nodes: No enlarged mediastinal, hilar, or axillary lymph
nodes. Thyroid gland, trachea, and esophagus demonstrate no
significant findings.

Lungs/Pleura: Similar appearing bilobed right lower lobe solid
pulmonary nodules (1.8 x 1.8 cm, previously 1.8 x 1.8 cm) which are
in close proximity with subsegmental branches of the right lower
lobe pulmonary artery, without definite evidence of enhancement.
Similar density, well-defined solid pulmonary nodule in the left
upper lobe (1.4 cm, previously 1.4 cm). Unchanged scattered,
innumerable mostly less than 6 mm solid pulmonary nodules throughout
the lungs bilaterally. No focal consolidations, pleural effusions,
or pneumothorax.

Upper Abdomen: The visualized upper abdomen is within normal limits.

Musculoskeletal: No chest wall abnormality. No acute or significant
osseous findings.

Review of the MIP images confirms the above findings.
IMPRESSION: Vascular:

No definite evidence of pulmonary artery aneurysm, as queried. The
bilobed structure in the right lower lobe, although in close
proximity to patent subsegmental pulmonary arteries does not
demonstrate luminal opacification.

Non-Vascular:

Unchanged appearance of bilobed right lower lobe solid pulmonary
nodules (1.8 cm), left upper lobe solid pulmonary nodule (1.4 cm),
and diffuse scattered less than 6 mm solid pulmonary nodules.
Non-contrast chest CT at 3-6 months is recommended. If the nodules
are stable at time of repeat CT, then future CT at 18-24 months
(from today's scan) is considered optional for low-risk patients,
but is recommended for high-risk patients. This recommendation
follows the consensus statement: Guidelines for Management of
Incidental Pulmonary Nodules Detected on CT Images: From the

## 2021-04-17 MED ORDER — IOHEXOL 350 MG/ML SOLN
75.0000 mL | Freq: Once | INTRAVENOUS | Status: AC | PRN
  Administered 2021-04-17: 75 mL via INTRAVENOUS

## 2021-04-17 NOTE — Patient Instructions (Signed)
Complete basic addition and subtract problems at home

## 2021-04-17 NOTE — Therapy (Signed)
[place] MAIN [facility] SERVICES [address] [place], [place], [address] Phone: [phone]   Fax:  [phone]  Speech Language Pathology Treatment  Patient Details  Name: [name] [name] MRN: [id number] Date of Birth: 01/01/1966 Referring Provider (SLP): [place], [place]   Encounter Date: 04/16/2021   End of Session - 04/17/21 0944    Visit Number 16    Number of Visits 25    Date for SLP Re-Evaluation 05/06/21    Authorization Type Medicaid    Authorization Time Period 02/11/2021 thru 05/06/2021    Authorization - Visit Number 6    Progress Note Due on Visit 10    SLP Start Time 1500    SLP Stop Time  1600    SLP Time Calculation (min) 60 min    Activity Tolerance Patient tolerated treatment well           Past Medical History:  Diagnosis Date  . Anoxic brain injury ([place])   . Cardiac arrest ([place])   . Cognitive communication deficit   . Hyperlipidemia   . Hypertension   . Seizures ([place])   . Ventricular fibrillation [facility])     Past Surgical History:  Procedure Laterality Date  . PEG PLACEMENT N/A 12/13/2020   Procedure: PERCUTANEOUS ENDOSCOPIC GASTROSTOMY (PEG) PLACEMENT;  Surgeon: Lesly Rubenstein, MD;  Location: [facility] ENDOSCOPY;  Service: Endoscopy;  Laterality: N/A;  . TRACHEOSTOMY TUBE PLACEMENT N/A 12/13/2020   Procedure: TRACHEOSTOMY;  Surgeon: Clyde Canterbury, MD;  Location: [facility] ORS;  Service: ENT;  Laterality: N/A;    There were no vitals filed for this visit.   Subjective Assessment - 04/17/21 0943    Subjective reported correct date and upcoming hair appt accurately    Patient is accompained by: Family member    Currently in Pain? No/denies                 ADULT SLP TREATMENT - 04/17/21 0001      Cognitive-Linquistic Treatment   Skilled Treatment 50% accurate improving to 100% accurate with maximal faded to moderate cues basic money addition and subtract; specifically she required cues for impulsivity,  problem solving and emergent awareness            SLP Education - 04/17/21 0944    Education Details impulsivity, slow rate, self-awareness    Person(s) Educated Patient;Spouse    Methods Explanation;Demonstration;Verbal cues;Handout    Comprehension Verbalized understanding;Need further instruction            SLP Short Term Goals - 03/20/21 0941      SLP SHORT TERM GOAL #1   Title Pt will independently demonstrate selective attention in mildly distracting environment for 15 minutes.    Baseline goal met (03/20/2021); upgraded    Time 10    Period --   sessions   Status Revised      SLP SHORT TERM GOAL #2   Title With moderate cues, pt will utilize speech intelligibility strategies to achieve > 90% intelligibility at the phrase level.    Baseline Goal not met in time period. Goal downgraded from minimal cues to moderate cues.    Time 10    Period --   sessions   Status Revised      SLP SHORT TERM GOAL #3   Title With moderate cues, pt will utilize compensatory memory strategies to recall therapy sessions for week as well as order of therapy in 8 out of 10 sessions.    Baseline goal  met (03/20/2021); goal updated    Time 10    Period --   sessions   Status Revised      SLP SHORT TERM GOAL #4   Title With moderate assistance, pt will complete basic problem solving tasks with >95% accuracy.    Baseline Goal met (03/20/2021); goal updated    Time 10    Period --   sessions   Status Revised      SLP SHORT TERM GOAL #5   Title Pt will consume current diet without any reports s/s of dysphagia by pt or family members.    Status Achieved            SLP Long Term Goals - 03/22/21 1447      SLP LONG TERM GOAL #1   Title Pt will consume safest, least restrictive diet without overt s/s of dysphagia, aspiration or respiratory distress.    Status Achieved      SLP LONG TERM GOAL #2   Title Pt will independently complete functional problem solving for basic and familiar  tasks.    Status On-going      SLP LONG TERM GOAL #3   Title Pt will demonstrate selective attention in a moderately distracting environment for 15 minutes with < 1 redirection to task.    Status On-going      SLP LONG TERM GOAL #4   Title With minimal cues, pt will increase speech intelligibility to > 75% at the sentence level.    Status On-going            Plan - 04/17/21 0945    Clinical Impression Statement Pt presents with severe memory impairment that impacts all cognitive functions as well as moderate dysarthria d/t cognitive deficits. Pt responded well to problem solving tasks involving money management. She also reported the accurate date and her overall communication is more precise and is louder.  While pt is progressing with assistance, she was not able to write appropriate information to add/subtract simplistic information for checkbook management. Skilled ST remains necessary to target there impairments in memory as well as pt's expressive abilities in order to increase pt's functional independence and ability to participate in family/community/leisure activities thereby reducing pt's caregiver's burden.    Speech Therapy Frequency 2x / week    Duration 12 weeks    Treatment/Interventions Diet toleration management by SLP;Cognitive reorganization;Compensatory techniques;Internal/external aids;SLP instruction and feedback;Patient/family education;Functional tasks;Compensatory strategies    Potential to Achieve Goals Fair    Potential Considerations Severity of impairments;Ability to learn/carryover information    SLP Home Exercise Plan provided, see instruction section    Consulted and Agree with Plan of Care Patient;Family member/caregiver    Family Member Consulted pt's husband           Patient will benefit from skilled therapeutic intervention in order to improve the following deficits and impairments:   Cognitive communication deficit  Anoxic brain injury  [facility])    Problem List Patient Active Problem List   Diagnosis Date Noted  . Multiple lung nodules 03/27/2021  . Dysarthria and anarthria 02/11/2021  . Sinus tachycardia   . Labile blood glucose   . Dysphagia   . Seizure prophylaxis   . Malnutrition of moderate degree 01/23/2021  . Anoxic brain injury ([place]) 01/10/2021  . Prediabetes   . Dyslipidemia   . S/P percutaneous endoscopic gastrostomy (PEG) tube placement ([place])   . Status post tracheostomy ([place])   . Aspiration pneumonia of both lower lobes due to gastric  secretions ([place]) 01/05/2021  . Acute respiratory failure ([place])   . Hypoxic ischemic encephalopathy   . Endotracheal tube present   . Cardiac arrest ([place]) 12/02/2020  . Essential hypertension 12/09/2011   Ahmani Daoud B. Rutherford Nail M.S., CCC-SLP, [place] Pathologist Rehabilitation Services Office [phone]  Stormy Fabian 04/17/2021, 9:47 AM  [place] MAIN [facility] SERVICES [address] [place], [place], [address] Phone: [phone]   Fax:  [phone]   Name: [name] [name] MRN: [id number] Date of Birth: [birth year]

## 2021-04-22 ENCOUNTER — Telehealth: Payer: Self-pay

## 2021-04-22 NOTE — Telephone Encounter (Signed)
Results of CTA sent to Dr. Rogue Bussing. Awaiting to hear from radiology regarding if any necrosis seen in fibroids on pelvic MRI.

## 2021-04-23 ENCOUNTER — Ambulatory Visit: Payer: Self-pay | Admitting: Occupational Therapy

## 2021-04-23 ENCOUNTER — Telehealth: Payer: Self-pay

## 2021-04-23 ENCOUNTER — Ambulatory Visit: Payer: Self-pay | Admitting: Speech Pathology

## 2021-04-23 ENCOUNTER — Ambulatory Visit: Payer: Self-pay

## 2021-04-23 NOTE — Telephone Encounter (Signed)
Called and reviewed CTA scan with spouse Daryl. They will be contacted by the lung nodule clinic to arrange appropriate surveillance scans. Awaiting radiology review of fibroids.    IMPRESSION: Vascular:  No definite evidence of pulmonary artery aneurysm, as queried. The bilobed structure in the right lower lobe, although in close proximity to patent subsegmental pulmonary arteries does not demonstrate luminal opacification.  Non-Vascular:  Unchanged appearance of bilobed right lower lobe solid pulmonary nodules (1.8 cm), left upper lobe solid pulmonary nodule (1.4 cm), and diffuse scattered less than 6 mm solid pulmonary nodules. Non-contrast chest CT at 3-6 months is recommended. If the nodules are stable at time of repeat CT, then future CT at 18-24 months (from today's scan) is considered optional for low-risk patients, but is recommended for high-risk patients. This recommendation follows the consensus statement: Guidelines for Management of Incidental Pulmonary Nodules Detected on CT Images: From the Fleischner Society 2017; Radiology 2017; [id number]

## 2021-04-25 ENCOUNTER — Other Ambulatory Visit: Payer: Self-pay

## 2021-04-28 ENCOUNTER — Other Ambulatory Visit: Payer: Self-pay

## 2021-04-28 ENCOUNTER — Ambulatory Visit: Payer: Self-pay | Attending: Physician Assistant | Admitting: Speech Pathology

## 2021-04-28 ENCOUNTER — Ambulatory Visit: Payer: Self-pay

## 2021-04-28 DIAGNOSIS — G931 Anoxic brain damage, not elsewhere classified: Secondary | ICD-10-CM | POA: Insufficient documentation

## 2021-04-28 DIAGNOSIS — M6281 Muscle weakness (generalized): Secondary | ICD-10-CM | POA: Insufficient documentation

## 2021-04-28 DIAGNOSIS — R278 Other lack of coordination: Secondary | ICD-10-CM | POA: Insufficient documentation

## 2021-04-28 DIAGNOSIS — R41841 Cognitive communication deficit: Secondary | ICD-10-CM | POA: Insufficient documentation

## 2021-04-28 NOTE — Therapy (Signed)
[place] MAIN [facility] SERVICES [address] [place], [place], [address] Phone: [phone]   Fax:  [phone]  Speech Language Pathology Treatment  Patient Details  Name: [name] [name] MRN: [id number] Date of Birth: [birth year] Referring Provider (SLP): Marlowe Shores, [place]   Encounter Date: 04/28/2021   End of Session - 04/28/21 1841    Visit Number 17    Number of Visits 25    Date for SLP Re-Evaluation 05/06/21    Authorization Type Medicaid    Authorization Time Period 02/11/2021 thru 05/06/2021    Authorization - Visit Number 7    Progress Note Due on Visit 10    SLP Start Time 1500    SLP Stop Time  1600    SLP Time Calculation (min) 60 min    Activity Tolerance Patient tolerated treatment well           Past Medical History:  Diagnosis Date  . Anoxic brain injury ([place])   . Cardiac arrest ([place])   . Cognitive communication deficit   . Hyperlipidemia   . Hypertension   . Seizures ([place])   . Ventricular fibrillation [facility])     Past Surgical History:  Procedure Laterality Date  . PEG PLACEMENT N/A 12/13/2020   Procedure: PERCUTANEOUS ENDOSCOPIC GASTROSTOMY (PEG) PLACEMENT;  Surgeon: Lesly Rubenstein, MD;  Location: [facility] ENDOSCOPY;  Service: Endoscopy;  Laterality: N/A;  . TRACHEOSTOMY TUBE PLACEMENT N/A 12/13/2020   Procedure: TRACHEOSTOMY;  Surgeon: Clyde Canterbury, MD;  Location: [facility] ORS;  Service: ENT;  Laterality: N/A;    There were no vitals filed for this visit.   Subjective Assessment - 04/28/21 1834    Subjective "I have been doing a lot"    Patient is accompained by: Family member    Currently in Pain? No/denies                 ADULT SLP TREATMENT - 04/28/21 0001      Cognitive-Linquistic Treatment   Skilled Treatment WORD PROBLEMS: Minimal assistance to solve basic word problems targeting functional scenerios; minimal assistance for emergent awareness throughout activity; moderate assistance for  impulsivity SPEECH PRODUCTION: > 75% intelligibility during unknown simple conversation            SLP Education - 04/28/21 1838    Education Details impulsivity, slow rate, self-awareness    Person(s) Educated Patient;Spouse    Methods Explanation;Demonstration;Verbal cues    Comprehension Verbalized understanding;Returned demonstration;Verbal cues required;Need further instruction            SLP Short Term Goals - 03/20/21 0941      SLP SHORT TERM GOAL #1   Title Pt will independently demonstrate selective attention in mildly distracting environment for 15 minutes.    Baseline goal met (03/20/2021); upgraded    Time 10    Period --   sessions   Status Revised      SLP SHORT TERM GOAL #2   Title With moderate cues, pt will utilize speech intelligibility strategies to achieve > 90% intelligibility at the phrase level.    Baseline Goal not met in time period. Goal downgraded from minimal cues to moderate cues.    Time 10    Period --   sessions   Status Revised      SLP SHORT TERM GOAL #3   Title With moderate cues, pt will utilize compensatory memory strategies to recall therapy sessions for week as well as order of therapy in 8 out of 10 sessions.  Baseline goal met (03/20/2021); goal updated    Time 10    Period --   sessions   Status Revised      SLP SHORT TERM GOAL #4   Title With moderate assistance, pt will complete basic problem solving tasks with >95% accuracy.    Baseline Goal met (03/20/2021); goal updated    Time 10    Period --   sessions   Status Revised      SLP SHORT TERM GOAL #5   Title Pt will consume current diet without any reports s/s of dysphagia by pt or family members.    Status Achieved            SLP Long Term Goals - 03/22/21 1447      SLP LONG TERM GOAL #1   Title Pt will consume safest, least restrictive diet without overt s/s of dysphagia, aspiration or respiratory distress.    Status Achieved      SLP LONG TERM GOAL #2   Title  Pt will independently complete functional problem solving for basic and familiar tasks.    Status On-going      SLP LONG TERM GOAL #3   Title Pt will demonstrate selective attention in a moderately distracting environment for 15 minutes with < 1 redirection to task.    Status On-going      SLP LONG TERM GOAL #4   Title With minimal cues, pt will increase speech intelligibility to > 75% at the sentence level.    Status On-going            Plan - 04/28/21 1842    Clinical Impression Statement Pt presents with moderate to severe cognitive communication impairment. 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 She reports being very proud of herself. In addition, her overall communication is more precise and is louder. She continues to require assistance with problem solving, impulsivity, memory and emergent awareness. Skilled ST remains necessary to target there impairments in memory as well as pt's expressive abilities in order to increase pt's functional independence and ability to participate in family/community/leisure activities thereby reducing pt's caregiver's burden.    Speech Therapy Frequency 2x / week    Duration 12 weeks    Treatment/Interventions Diet toleration management by SLP;Cognitive reorganization;Compensatory techniques;Internal/external aids;SLP instruction and feedback;Patient/family education;Functional tasks;Compensatory strategies    Potential to Achieve Goals Good    Potential Considerations Severity of impairments;Ability to learn/carryover information    SLP Home Exercise Plan provided, see instruction section    Consulted and Agree with Plan of Care Patient;Family member/caregiver    Family Member Consulted pt's husband           Patient will benefit from skilled therapeutic intervention in order to improve the following  deficits and impairments:   Cognitive communication deficit  Anoxic brain injury [facility])    Problem List Patient Active Problem List   Diagnosis Date Noted  . Multiple lung nodules 03/27/2021  . Dysarthria and anarthria 02/11/2021  . Sinus tachycardia   . Labile blood glucose   . Dysphagia   . Seizure prophylaxis   . Malnutrition of moderate degree 01/23/2021  . Anoxic brain injury ([place]) 01/10/2021  . Prediabetes   . Dyslipidemia   . S/P percutaneous endoscopic gastrostomy (PEG) tube placement ([place])   . Status post tracheostomy ([place])   . Aspiration pneumonia of both  lower lobes due to gastric secretions ([place]) 01/05/2021  . Acute respiratory failure ([place])   . Hypoxic ischemic encephalopathy   . Endotracheal tube present   . Cardiac arrest ([place]) 12/02/2020  . Essential hypertension 12/09/2011   Jolanta Cabeza B. Rutherford Nail M.S., CCC-SLP, [place] Pathologist Rehabilitation Services Office [phone]  Stormy Fabian 04/28/2021, 6:47 PM  [place] MAIN [facility] SERVICES [address] [place], [place], [address] Phone: [phone]   Fax:  [phone]   Name: [name] [name] MRN: [id number] Date of Birth: July 27, 1966

## 2021-04-30 ENCOUNTER — Ambulatory Visit: Payer: Self-pay | Admitting: Occupational Therapy

## 2021-04-30 ENCOUNTER — Ambulatory Visit: Payer: Self-pay | Admitting: Speech Pathology

## 2021-04-30 ENCOUNTER — Other Ambulatory Visit: Payer: Self-pay

## 2021-04-30 ENCOUNTER — Ambulatory Visit: Payer: Self-pay

## 2021-04-30 ENCOUNTER — Encounter: Payer: Self-pay | Admitting: Occupational Therapy

## 2021-04-30 DIAGNOSIS — M6281 Muscle weakness (generalized): Secondary | ICD-10-CM

## 2021-04-30 DIAGNOSIS — G931 Anoxic brain damage, not elsewhere classified: Secondary | ICD-10-CM

## 2021-04-30 DIAGNOSIS — R278 Other lack of coordination: Secondary | ICD-10-CM

## 2021-04-30 DIAGNOSIS — R41841 Cognitive communication deficit: Secondary | ICD-10-CM

## 2021-04-30 NOTE — Therapy (Signed)
[place] MAIN [facility] SERVICES [address] [place], [place], [address] Phone: [phone]   Fax:  [phone]  Occupational Therapy Treatment  Patient Details  Name: [name] [name] MRN: [id number] Date of Birth: 07/31/1966 No data recorded  Encounter Date: 04/30/2021   OT End of Session - 04/30/21 1621    Visit Number 13    Number of Visits 24    Date for OT Re-Evaluation 05/13/21    Authorization Type Progress reports period starting 02/11/2021    OT Start Time 1600    OT Stop Time 1645    OT Time Calculation (min) 45 min    Activity Tolerance Patient tolerated treatment well    Behavior During Therapy [facility] for tasks assessed/performed           Past Medical History:  Diagnosis Date  . Anoxic brain injury ([place])   . Cardiac arrest ([place])   . Cognitive communication deficit   . Hyperlipidemia   . Hypertension   . Seizures ([place])   . Ventricular fibrillation [facility])     Past Surgical History:  Procedure Laterality Date  . PEG PLACEMENT N/A 12/13/2020   Procedure: PERCUTANEOUS ENDOSCOPIC GASTROSTOMY (PEG) PLACEMENT;  Surgeon: Lesly Rubenstein, MD;  Location: [facility] ENDOSCOPY;  Service: Endoscopy;  Laterality: N/A;  . TRACHEOSTOMY TUBE PLACEMENT N/A 12/13/2020   Procedure: TRACHEOSTOMY;  Surgeon: Clyde Canterbury, MD;  Location: [facility] ORS;  Service: ENT;  Laterality: N/A;    There were no vitals filed for this visit.   Subjective Assessment - 04/30/21 1620    Subjective  Pt. reports doing well today.    Patient is accompanied by: Family member    Pertinent History Pt. is a 55 y.o. female who went into cardiac arrest, and was admitted to [facility] on 12/02/2020. Pt. was diagnosed with Anoxic Brain Injury following the Cardiac Arrest. When medically stable, the pt. was tranferred to [place] rehab. The patient was discharged home on 02/08/2021, and is ready to begin outpatient OT services. Pt. was independent, and working prior to the  onset. Pt. Has very supportive family. Pt. Enjoys gardening.    Currently in Pain? No/denies          OT TREATMENT   Therapeutic Exercise:  Pt. worked on [employer], and reciprocal motion using the UBE in standing for 8 min. with no resistance. Constant monitoring was provided.  Selfcare:  Pt. worked on Armed forces logistics/support/administrative officer sentences. Pt. utilized a standard pen. Pt. was able to formulate sentences in printed form with 50% legibility initially with intermittent deviation below the line, and decreased spacing between the words.   Pt. is making progress overall. Pt. continues to be able to write multiple sentences with 50-75% legibility inprinted form. Pt. required cues to increase spacing between each word in the sentence.  pt. Presented with intermittent deviation below the line. Pt. continues to work on improving UE strength, and [facility] skills in order to work towards improving, and maximizing independence with ADLs, and IADL tasks.                          OT Education - 04/30/21 1621    Education Details UE strength, [place], writing.    Person(s) Educated Patient;Child(ren)    Methods Explanation;Demonstration    Comprehension Verbalized understanding;Returned demonstration               OT Long Term Goals - 04/09/21 1656  OT LONG TERM GOAL #1   Title Pt. will be able to independently sign her name with 100% legibility.    Baseline Pt. continues to present with limited signature. Eval: Signature not legible, however was able to sign on the line with no deviation above, or below the line.    Time 12    Period Weeks    Status On-going    Target Date 05/13/21      OT LONG TERM GOAL #2   Title Pt. will increase right shoulder AROM to be able to independently retrieve items from closets, and cabinets.    Baseline Pt. has improved with AROM, and is now able to retrieve items from closets, and cabinets. Eval: Pt. presents  with limited ROM.    Time 12    Period Weeks    Status Partially Met    Target Date 05/13/21      OT LONG TERM GOAL #3   Title Pt. will increase right grip strength by 5# to be able hold a full glass    Baseline Pt. is improving with formulating a composite fist. Eval: limited grip strength. Limited bilateral digit flexion, unable to make a full composite fist.    Time 12    Period Weeks    Status On-going    Target Date 05/13/21      OT LONG TERM GOAL #4   Title Pt. will independently access a computer, and type a one sentence email correspondence.    Baseline Pt. is able to type a one sentence correspondence with multiple mistypes. Eval: Pt. is unable to to perform    Time 12    Period Weeks    Status On-going    Target Date 05/13/21      OT LONG TERM GOAL #5   Title Pt. will improve bilateral [facility] skills to be able to grasp, and manipulate small objects during ADLs.    Baseline Pt. continues to have difficulty grasping,a nd manipulating small objects.Eval: Pt. presents with limited [facility] skills with difficulty picking,a nd manipulating small objects.    Time 12    Period Weeks    Status On-going    Target Date 05/13/21      OT LONG TERM GOAL #6   Title Pt. will improve UE strength by 2 mm grades to assist with ADLs, and IADLS,    Baseline Pt. continues to present with limited BUE stregth. Pt. is engaging in more ADL,a nd IADL tasks at home. Eval: pt. has difficulty performing.    Time 12    Period Weeks    Status On-going    Target Date 05/13/21      OT LONG TERM GOAL #7   Title Pt. will complete light meal preparation with minA    Baseline Pt. is preparing meals with her daughter's assist. Eval: Pt. is unable to prepare light meal preparation.    Time 12    Period Weeks    Status On-going    Target Date 05/13/21      OT LONG TERM GOAL #8   Title Pt. will perform light home management tasks with minA    Baseline Pt. is engaging in more home management tasks. Eval: pt.  is unable to perform    Time 12    Period Weeks    Status On-going    Target Date 05/13/21                 Plan - 04/30/21 1622  Clinical Impression Statement Pt. is making progress overall. Pt. continues to be able to write multiple sentences with 50-75% legibility inprinted form. Pt. required cues to increase spacing between each word in the sentence.  pt. Presented with intermittent deviation below the line. Pt. continues to work on improving UE strength, and [facility] skills in order to work towards improving, and maximizing independence with ADLs, and IADL tasks.   OT Occupational Profile and History Problem Focused Assessment - Including review of records relating to presenting problem    Occupational performance deficits (Please refer to evaluation for details): ADL's;IADL's    Body Structure / Function / Physical Skills ADL;IADL;FMC;ROM;Strength    Rehab Potential Good    Clinical Decision Making Several treatment options, min-mod task modification necessary    Comorbidities Affecting Occupational Performance: May have comorbidities impacting occupational performance    Modification or Assistance to Complete Evaluation  Min-Moderate modification of tasks or assist with assess necessary to complete eval    OT Frequency 2x / week    OT Duration 12 weeks    OT Treatment/Interventions Self-care/ADL training;Neuromuscular education;Therapeutic activities;Patient/family education;DME and/or AE instruction;Visual/perceptual remediation/compensation    Consulted and Agree with Plan of Care Patient           Patient will benefit from skilled therapeutic intervention in order to improve the following deficits and impairments:   Body Structure / Function / Physical Skills: ADL,IADL,FMC,ROM,Strength       Visit Diagnosis: Muscle weakness (generalized)  Other lack of coordination    Problem List Patient Active Problem List   Diagnosis Date Noted  . Multiple lung nodules  03/27/2021  . Dysarthria and anarthria 02/11/2021  . Sinus tachycardia   . Labile blood glucose   . Dysphagia   . Seizure prophylaxis   . Malnutrition of moderate degree 01/23/2021  . Anoxic brain injury ([place]) 01/10/2021  . Prediabetes   . Dyslipidemia   . S/P percutaneous endoscopic gastrostomy (PEG) tube placement ([place])   . Status post tracheostomy ([place])   . Aspiration pneumonia of both lower lobes due to gastric secretions ([place]) 01/05/2021  . Acute respiratory failure ([place])   . Hypoxic ischemic encephalopathy   . Endotracheal tube present   . Cardiac arrest ([place]) 12/02/2020  . Essential hypertension 12/09/2011    Harrel Carina, MS, OTR/L 04/30/2021, 4:27 PM  [place] MAIN [facility] SERVICES [address] [place], [place], [address] Phone: [phone]   Fax:  [phone]  Name: JANIEL DERHAMMER MRN: [id number] Date of Birth: 01/16/1966

## 2021-04-30 NOTE — Therapy (Signed)
[place] MAIN [facility] SERVICES [address] [place], [place], [address] Phone: [phone]   Fax:  [phone]  Speech Language Pathology Treatment  Patient Details  Name: [name] [name] MRN: [id number] Date of Birth: [birth year] Referring Provider (SLP): Marlowe [place], [place]   Encounter Date: 04/30/2021   End of Session - 04/30/21 1658    Visit Number 18    Number of Visits 25    Date for SLP Re-Evaluation 05/06/21    Authorization Type Medicaid    Authorization Time Period 02/11/2021 thru 05/06/2021    Authorization - Visit Number 8    Progress Note Due on Visit 10    SLP Start Time 1500    SLP Stop Time  1600    SLP Time Calculation (min) 60 min    Activity Tolerance Patient tolerated treatment well           Past Medical History:  Diagnosis Date  . Anoxic brain injury ([place])   . Cardiac arrest ([place])   . Cognitive communication deficit   . Hyperlipidemia   . Hypertension   . Seizures ([place])   . Ventricular fibrillation [facility])     Past Surgical History:  Procedure Laterality Date  . PEG PLACEMENT N/A 12/13/2020   Procedure: PERCUTANEOUS ENDOSCOPIC GASTROSTOMY (PEG) PLACEMENT;  Surgeon: Lesly Rubenstein, MD;  Location: [facility] ENDOSCOPY;  Service: Endoscopy;  Laterality: N/A;  . TRACHEOSTOMY TUBE PLACEMENT N/A 12/13/2020   Procedure: TRACHEOSTOMY;  Surgeon: Clyde Canterbury, MD;  Location: [facility] ORS;  Service: ENT;  Laterality: N/A;    There were no vitals filed for this visit.   Subjective Assessment - 04/30/21 1657    Subjective "I need to take my time"; "I brought in my calculator"    Currently in Pain? No/denies                 ADULT SLP TREATMENT - 04/30/21 0001      Cognitive-Linquistic Treatment   Skilled Treatment Minimal assistance with recall of strategies to increase accuracy of written information, information typed into calculator; Minimal cues for accurate math problem solving to achieve 90% accuracy,  supervision cues for emergent awareness; independent with immediate recall of information to be written down            SLP Education - 04/30/21 1657    Education Details reading numbers on calculator    Person(s) Educated Patient    Methods Explanation    Comprehension Need further instruction            SLP Short Term Goals - 03/20/21 0941      SLP SHORT TERM GOAL #1   Title Pt will independently demonstrate selective attention in mildly distracting environment for 15 minutes.    Baseline goal met (03/20/2021); upgraded    Time 10    Period --   sessions   Status Revised      SLP SHORT TERM GOAL #2   Title With moderate cues, pt will utilize speech intelligibility strategies to achieve > 90% intelligibility at the phrase level.    Baseline Goal not met in time period. Goal downgraded from minimal cues to moderate cues.    Time 10    Period --   sessions   Status Revised      SLP SHORT TERM GOAL #3   Title With moderate cues, pt will utilize compensatory memory strategies to recall therapy sessions for week as well as order of therapy in 8 out of 10  sessions.    Baseline goal met (03/20/2021); goal updated    Time 10    Period --   sessions   Status Revised      SLP SHORT TERM GOAL #4   Title With moderate assistance, pt will complete basic problem solving tasks with >95% accuracy.    Baseline Goal met (03/20/2021); goal updated    Time 10    Period --   sessions   Status Revised      SLP SHORT TERM GOAL #5   Title Pt will consume current diet without any reports s/s of dysphagia by pt or family members.    Status Achieved            SLP Long Term Goals - 03/22/21 1447      SLP LONG TERM GOAL #1   Title Pt will consume safest, least restrictive diet without overt s/s of dysphagia, aspiration or respiratory distress.    Status Achieved      SLP LONG TERM GOAL #2   Title Pt will independently complete functional problem solving for basic and familiar tasks.     Status On-going      SLP LONG TERM GOAL #3   Title Pt will demonstrate selective attention in a moderately distracting environment for 15 minutes with < 1 redirection to task.    Status On-going      SLP LONG TERM GOAL #4   Title With minimal cues, pt will increase speech intelligibility to > 75% at the sentence level.    Status On-going            Plan - 04/30/21 1658    Clinical Impression Statement Pt presents with moderate cognitive communication impairment. Her cognition continues to improve and as a result, her speech is also more intelligible at > 90% at the connected sentence level. Pt demonstrated great improvement with money management, immediate recall and problem solving as well as emergent awareness during today's session. Of note, pt had difficulty reading numbers on larger calculator. When she looked at each number "a little longer" she was able to state correctly. This needs further assessment for creation of strategies to increase accuracy as returning to work depends on this ability. Skilled ST remains necessary to target there impairments in memory as well as pt's expressive abilities in order to increase pt's functional independence and ability to participate in family/community/leisure activities thereby reducing pt's caregiver's burden.    Speech Therapy Frequency 2x / week    Duration 12 weeks    Treatment/Interventions Diet toleration management by SLP;Cognitive reorganization;Compensatory techniques;Internal/external aids;SLP instruction and feedback;Patient/family education;Functional tasks;Compensatory strategies    Potential to Achieve Goals Good    SLP Home Exercise Plan provided, see instruction section    Consulted and Agree with Plan of Care Patient           Patient will benefit from skilled therapeutic intervention in order to improve the following deficits and impairments:   Cognitive communication deficit  Anoxic brain injury [facility])    Problem  List Patient Active Problem List   Diagnosis Date Noted  . Multiple lung nodules 03/27/2021  . Dysarthria and anarthria 02/11/2021  . Sinus tachycardia   . Labile blood glucose   . Dysphagia   . Seizure prophylaxis   . Malnutrition of moderate degree 01/23/2021  . Anoxic brain injury ([place]) 01/10/2021  . Prediabetes   . Dyslipidemia   . S/P percutaneous endoscopic gastrostomy (PEG) tube placement ([place])   . Status post tracheostomy (  [place])   . Aspiration pneumonia of both lower lobes due to gastric secretions ([place]) 01/05/2021  . Acute respiratory failure ([place])   . Hypoxic ischemic encephalopathy   . Endotracheal tube present   . Cardiac arrest ([place]) 12/02/2020  . Essential hypertension 12/09/2011   Sherrell Farish B. Rutherford Nail M.S., CCC-SLP, [place] Office [phone]  Stormy Fabian 04/30/2021, 5:00 PM  [place] MAIN [facility] SERVICES [address] [place], [place], [address] Phone: [phone]   Fax:  [phone]   Name: ELIETTE DRUMWRIGHT MRN: [id number] Date of Birth: May 13, 1966

## 2021-05-01 ENCOUNTER — Telehealth: Payer: Self-pay

## 2021-05-01 NOTE — Telephone Encounter (Signed)
Dr. Theora Gianotti has reviewed message from radiology regarding fibroids. They do not see any liquefied, necrotic fibroids. However, there are a few hypoenhancing fibroids which may be degenerating. 3 month follow up arranged.

## 2021-05-05 ENCOUNTER — Ambulatory Visit: Payer: Self-pay | Admitting: Occupational Therapy

## 2021-05-05 ENCOUNTER — Ambulatory Visit: Payer: Self-pay

## 2021-05-07 ENCOUNTER — Other Ambulatory Visit: Payer: Self-pay

## 2021-05-07 ENCOUNTER — Ambulatory Visit: Payer: Self-pay | Admitting: Speech Pathology

## 2021-05-07 ENCOUNTER — Encounter: Payer: Self-pay | Admitting: Occupational Therapy

## 2021-05-07 ENCOUNTER — Ambulatory Visit: Payer: Self-pay | Admitting: Occupational Therapy

## 2021-05-07 ENCOUNTER — Ambulatory Visit: Payer: Self-pay

## 2021-05-07 DIAGNOSIS — G931 Anoxic brain damage, not elsewhere classified: Secondary | ICD-10-CM

## 2021-05-07 DIAGNOSIS — R41841 Cognitive communication deficit: Secondary | ICD-10-CM

## 2021-05-07 DIAGNOSIS — R278 Other lack of coordination: Secondary | ICD-10-CM

## 2021-05-07 DIAGNOSIS — M6281 Muscle weakness (generalized): Secondary | ICD-10-CM

## 2021-05-07 NOTE — Therapy (Signed)
[place] MAIN [facility] SERVICES [address] [place], [place], [address] Phone: [phone]   Fax:  [phone]  Occupational Therapy Treatment  Patient Details  Name: [name] [name] MRN: [id number] Date of Birth: 10/04/1966 No data recorded  Encounter Date: 05/07/2021   OT End of Session - 05/07/21 1742     Visit Number 14    Number of Visits 24    Date for OT Re-Evaluation 05/13/21    Authorization Type Progress reports period starting 02/11/2021    OT Start Time 1600    OT Stop Time 1645    OT Time Calculation (min) 45 min    Activity Tolerance Patient tolerated treatment well    Behavior During Therapy [facility] for tasks assessed/performed             Past Medical History:  Diagnosis Date   Anoxic brain injury ([place])    Cardiac arrest ([place])    Cognitive communication deficit    Hyperlipidemia    Hypertension    Seizures ([place])    Ventricular fibrillation ([place])     Past Surgical History:  Procedure Laterality Date   PEG PLACEMENT N/A 12/13/2020   Procedure: PERCUTANEOUS ENDOSCOPIC GASTROSTOMY (PEG) PLACEMENT;  Surgeon: Lesly Rubenstein, MD;  Location: [facility] ENDOSCOPY;  Service: Endoscopy;  Laterality: N/A;   TRACHEOSTOMY TUBE PLACEMENT N/A 12/13/2020   Procedure: TRACHEOSTOMY;  Surgeon: Clyde Canterbury, MD;  Location: [facility] ORS;  Service: ENT;  Laterality: N/A;    There were no vitals filed for this visit.   Subjective Assessment - 05/07/21 1741     Subjective  Pt. reports doing well today.    Patient is accompanied by: Family member    Pertinent History Pt. is a 55 y.o. female who went into cardiac arrest, and was admitted to [facility] on 12/02/2020. Pt. was diagnosed with Anoxic Brain Injury following the Cardiac Arrest. When medically stable, the pt. was tranferred to [place] rehab. The patient was discharged home on 02/08/2021, and is ready to begin outpatient OT services. Pt. was independent, and working prior to the  onset. Pt. Has very supportive family. Pt. Enjoys gardening.    Currently in Pain? No/denies            OT TREATMENT     Therapeutic Exercise:   Pt. worked on [employer], and reciprocal motion using the UBE in standing for 8 min. with no resistance. Constant monitoring was provided.    Selfcare:   Pt. worked on [employer] written sentences using a standard pencil initially, and transitioning to a pen. Pt. was able to formulate sentences in printed form with 75% legibility with less deviation below, and above the line.    Pt. continues to make progress with  Pt. Was able to write multiple short sentences using 75% legibility in printed form when writing the sentences in incremental segments progressing to writing the full sentence. Pt.  presented with improved spacing between each word of the sentence. Pt. presented with improved deviation above, and below the line. Pt. Was able to maintain a mature grasp on the pencil, and pen for the duration of the written text. Pt. Presented with less periods of shakiness when writing. Pt. continues to work on improving UE strength, and [facility] skills in order to work towards improving, and maximizing independence with ADLs, and IADL tasks.  OT Education - 05/07/21 1742     Education Details UE strength, [place], writing.    Person(s) Educated Patient;Child(ren)    Methods Explanation;Demonstration    Comprehension Verbalized understanding;Returned demonstration                 OT Long Term Goals - 04/09/21 1656       OT LONG TERM GOAL #1   Title Pt. will be able to independently sign her name with 100% legibility.    Baseline Pt. continues to present with limited signature. Eval: Signature not legible, however was able to sign on the line with no deviation above, or below the line.    Time 12    Period Weeks    Status On-going    Target Date 05/13/21      OT LONG  TERM GOAL #2   Title Pt. will increase right shoulder AROM to be able to independently retrieve items from closets, and cabinets.    Baseline Pt. has improved with AROM, and is now able to retrieve items from closets, and cabinets. Eval: Pt. presents with limited ROM.    Time 12    Period Weeks    Status Partially Met    Target Date 05/13/21      OT LONG TERM GOAL #3   Title Pt. will increase right grip strength by 5# to be able hold a full glass    Baseline Pt. is improving with formulating a composite fist. Eval: limited grip strength. Limited bilateral digit flexion, unable to make a full composite fist.    Time 12    Period Weeks    Status On-going    Target Date 05/13/21      OT LONG TERM GOAL #4   Title Pt. will independently access a computer, and type a one sentence email correspondence.    Baseline Pt. is able to type a one sentence correspondence with multiple mistypes. Eval: Pt. is unable to to perform    Time 12    Period Weeks    Status On-going    Target Date 05/13/21      OT LONG TERM GOAL #5   Title Pt. will improve bilateral [facility] skills to be able to grasp, and manipulate small objects during ADLs.    Baseline Pt. continues to have difficulty grasping,a nd manipulating small objects.Eval: Pt. presents with limited [facility] skills with difficulty picking,a nd manipulating small objects.    Time 12    Period Weeks    Status On-going    Target Date 05/13/21      OT LONG TERM GOAL #6   Title Pt. will improve UE strength by 2 mm grades to assist with ADLs, and IADLS,    Baseline Pt. continues to present with limited BUE stregth. Pt. is engaging in more ADL,a nd IADL tasks at home. Eval: pt. has difficulty performing.    Time 12    Period Weeks    Status On-going    Target Date 05/13/21      OT LONG TERM GOAL #7   Title Pt. will complete light meal preparation with minA    Baseline Pt. is preparing meals with her daughter's assist. Eval: Pt. is unable to prepare light  meal preparation.    Time 12    Period Weeks    Status On-going    Target Date 05/13/21      OT LONG TERM GOAL #8   Title Pt. will perform light home management tasks with minA  Baseline Pt. is engaging in more home management tasks. Eval: pt. is unable to perform    Time 12    Period Weeks    Status On-going    Target Date 05/13/21                   Plan - 05/07/21 1743     Clinical Impression Statement Pt. continues to make progress with  Pt. Was able to write multiple short sentences using 75% legibility in printed form when writing the sentences in incremental segments progressing to writing the full sentence. Pt.  presented with improved spacing between each word of the sentence. Pt. presented with improved deviation above, and below the line. Pt. Was able to maintain a mature grasp on the pencil, and pen for the duration of the written text. Pt. Presented with less periods of shakiness when writing. Pt. continues to work on improving UE strength, and [facility] skills in order to work towards improving, and maximizing independence with ADLs, and IADL tasks.       OT Occupational Profile and History Problem Focused Assessment - Including review of records relating to presenting problem    Occupational performance deficits (Please refer to evaluation for details): ADL's;IADL's    Body Structure / Function / Physical Skills ADL;IADL;FMC;ROM;Strength    Rehab Potential Good    Clinical Decision Making Several treatment options, min-mod task modification necessary    Comorbidities Affecting Occupational Performance: May have comorbidities impacting occupational performance    Modification or Assistance to Complete Evaluation  Min-Moderate modification of tasks or assist with assess necessary to complete eval    OT Frequency 2x / week    OT Duration 12 weeks    OT Treatment/Interventions Self-care/ADL training;Neuromuscular education;Therapeutic activities;Patient/family  education;DME and/or AE instruction;Visual/perceptual remediation/compensation    Consulted and Agree with Plan of Care Patient             Patient will benefit from skilled therapeutic intervention in order to improve the following deficits and impairments:   Body Structure / Function / Physical Skills: ADL, IADL, FMC, ROM, Strength       Visit Diagnosis: Muscle weakness (generalized)  Other lack of coordination    Problem List Patient Active Problem List   Diagnosis Date Noted   Multiple lung nodules 03/27/2021   Dysarthria and anarthria 02/11/2021   Sinus tachycardia    Labile blood glucose    Dysphagia    Seizure prophylaxis    Malnutrition of moderate degree 01/23/2021   Anoxic brain injury ([place]) 01/10/2021   Prediabetes    Dyslipidemia    S/P percutaneous endoscopic gastrostomy (PEG) tube placement ([place])    Status post tracheostomy ([place])    Aspiration pneumonia of both lower lobes due to gastric secretions ([place]) 01/05/2021   Acute respiratory failure ([place])    Hypoxic ischemic encephalopathy    Endotracheal tube present    Cardiac arrest ([place]) 12/02/2020   Essential hypertension 12/09/2011    Harrel Carina, MS, OTR/L 05/07/2021, 5:47 PM  [place] MAIN [facility] SERVICES [address] [place], [place], [address] Phone: [phone]   Fax:  [phone]  Name: [name] [name] MRN: [id number] Date of Birth: 01-10-1966

## 2021-05-08 NOTE — Therapy (Signed)
[place] MAIN [facility] SERVICES [address] [place], [place], [address] Phone: [phone]   Fax:  [phone]  Speech Language Pathology Treatment  Patient Details  Name: [name] [name] MRN: [id number] Date of Birth: 31-Aug-1966 Referring Provider (SLP): Marlowe [place], [place]   Encounter Date: 05/07/2021   End of Session - 05/08/21 1638     Visit Number 19    Number of Visits 25    Date for SLP Re-Evaluation 05/06/21    Authorization Type Medicaid    Authorization Time Period 02/11/2021 thru 05/06/2021    Authorization - Visit Number 9    Progress Note Due on Visit 10    SLP Start Time 1500    SLP Stop Time  1600    SLP Time Calculation (min) 60 min    Activity Tolerance Patient tolerated treatment well             Past Medical History:  Diagnosis Date   Anoxic brain injury ([place])    Cardiac arrest ([place])    Cognitive communication deficit    Hyperlipidemia    Hypertension    Seizures ([place])    Ventricular fibrillation ([place])     Past Surgical History:  Procedure Laterality Date   PEG PLACEMENT N/A 12/13/2020   Procedure: PERCUTANEOUS ENDOSCOPIC GASTROSTOMY (PEG) PLACEMENT;  Surgeon: Lesly Rubenstein, MD;  Location: [facility] ENDOSCOPY;  Service: Endoscopy;  Laterality: N/A;   TRACHEOSTOMY TUBE PLACEMENT N/A 12/13/2020   Procedure: TRACHEOSTOMY;  Surgeon: Clyde Canterbury, MD;  Location: [facility] ORS;  Service: ENT;  Laterality: N/A;    There were no vitals filed for this visit.   Subjective Assessment - 05/08/21 1637     Subjective Pt emotional "my writing didn't go so well, why is it so hard?, I can't copy" - pt began crying    Patient is accompained by: Family member    Currently in Pain? No/denies                   ADULT SLP TREATMENT - 05/08/21 0001       Cognitive-Linquistic Treatment   Skilled Treatment Education provided on the multiple cognitive processes that are involved when attempting to copy words or create a  printed sentence; when filling out dollar amounts on check task, pt required maximal assistance with gradient to less assistance, when assistance completely faded, pt unable to complete task              SLP Education - 05/08/21 1638     Education Details cognitive functions used when copying information    Person(s) Educated Patient;Child(ren)    Methods Explanation;Demonstration;Verbal cues;Handout    Comprehension Verbalized understanding;Need further instruction              SLP Short Term Goals - 03/20/21 0941       SLP SHORT TERM GOAL #1   Title Pt will independently demonstrate selective attention in mildly distracting environment for 15 minutes.    Baseline goal met (03/20/2021); upgraded    Time 10    Period --   sessions   Status Revised      SLP SHORT TERM GOAL #2   Title With moderate cues, pt will utilize speech intelligibility strategies to achieve > 90% intelligibility at the phrase level.    Baseline Goal not met in time period. Goal downgraded from minimal cues to moderate cues.    Time 10    Period --   sessions   Status Revised  SLP SHORT TERM GOAL #3   Title With moderate cues, pt will utilize compensatory memory strategies to recall therapy sessions for week as well as order of therapy in 8 out of 10 sessions.    Baseline goal met (03/20/2021); goal updated    Time 10    Period --   sessions   Status Revised      SLP SHORT TERM GOAL #4   Title With moderate assistance, pt will complete basic problem solving tasks with >95% accuracy.    Baseline Goal met (03/20/2021); goal updated    Time 10    Period --   sessions   Status Revised      SLP SHORT TERM GOAL #5   Title Pt will consume current diet without any reports s/s of dysphagia by pt or family members.    Status Achieved              SLP Long Term Goals - 03/22/21 1447       SLP LONG TERM GOAL #1   Title Pt will consume safest, least restrictive diet without overt s/s of  dysphagia, aspiration or respiratory distress.    Status Achieved      SLP LONG TERM GOAL #2   Title Pt will independently complete functional problem solving for basic and familiar tasks.    Status On-going      SLP LONG TERM GOAL #3   Title Pt will demonstrate selective attention in a moderately distracting environment for 15 minutes with < 1 redirection to task.    Status On-going      SLP LONG TERM GOAL #4   Title With minimal cues, pt will increase speech intelligibility to > 75% at the sentence level.    Status On-going              Plan - 05/08/21 1639     Clinical Impression Statement Pt presents with moderate cognitive communication impairment c/b deficits in all areas of cognition - memory, problem solving, divided attention, awareness and impulsivity. She continues to respond well to therapy and is eager, she demonstrates good reflection on task with increased insight during specific task. However, she is unable to perform tasks to return to work, manage her own finances and household. Skilled ST remains necessary to target there impairments in memory as well as pt's expressive abilities in order to increase pt's functional independence and ability to participate in family/community/leisure activities thereby reducing pt's caregiver's burden.    Speech Therapy Frequency 2x / week    Duration 12 weeks    Treatment/Interventions Diet toleration management by SLP;Cognitive reorganization;Compensatory techniques;Internal/external aids;SLP instruction and feedback;Patient/family education;Functional tasks;Compensatory strategies    Potential to Achieve Goals Good    SLP Home Exercise Plan provided, see instruction section    Consulted and Agree with Plan of Care Patient;Family member/caregiver    Family Member Consulted pt's daughter             Patient will benefit from skilled therapeutic intervention in order to improve the following deficits and impairments:    Cognitive communication deficit  Anoxic brain injury [facility])    Problem List Patient Active Problem List   Diagnosis Date Noted   Multiple lung nodules 03/27/2021   Dysarthria and anarthria 02/11/2021   Sinus tachycardia    Labile blood glucose    Dysphagia    Seizure prophylaxis    Malnutrition of moderate degree 01/23/2021   Anoxic brain injury ([place]) 01/10/2021   Prediabetes  Dyslipidemia    S/P percutaneous endoscopic gastrostomy (PEG) tube placement [facility])    Status post tracheostomy ([place])    Aspiration pneumonia of both lower lobes due to gastric secretions ([place]) 01/05/2021   Acute respiratory failure ([facility])    Hypoxic ischemic encephalopathy    Endotracheal tube present    Cardiac arrest ([place]) 12/02/2020   Essential hypertension 12/09/2011   Celenia Hruska B. Rutherford Nail M.S., CCC-SLP, [place] Pathologist Rehabilitation Services Office [phone]  Stormy Fabian 05/08/2021, 4:40 PM  [place] MAIN [facility] SERVICES [address] [place], [place], [address] Phone: [phone]   Fax:  [phone]   Name: KINZY WEYERS MRN: [id number] Date of Birth: [birth year]

## 2021-05-08 NOTE — Patient Instructions (Signed)
Practice adding/subtracting money

## 2021-05-12 ENCOUNTER — Ambulatory Visit: Payer: Self-pay

## 2021-05-12 ENCOUNTER — Ambulatory Visit: Payer: Self-pay | Admitting: Speech Pathology

## 2021-05-12 ENCOUNTER — Ambulatory Visit: Payer: Self-pay | Admitting: Occupational Therapy

## 2021-05-14 ENCOUNTER — Ambulatory Visit: Payer: Self-pay

## 2021-05-14 ENCOUNTER — Ambulatory Visit: Payer: Self-pay | Admitting: Speech Pathology

## 2021-05-19 ENCOUNTER — Ambulatory Visit: Payer: Self-pay | Admitting: Speech Pathology

## 2021-05-19 ENCOUNTER — Ambulatory Visit: Payer: Self-pay | Admitting: Occupational Therapy

## 2021-05-19 ENCOUNTER — Telehealth: Payer: Self-pay | Admitting: Speech Pathology

## 2021-05-19 ENCOUNTER — Ambulatory Visit: Payer: Self-pay

## 2021-05-19 NOTE — Telephone Encounter (Signed)
I reached out to pt's husband (contact listed) as pt missed therapy last week. He states that his family is "dealing with COVID" and would like to cancel all appts this week.   Front desk notified regarding request to cancel.     Kristeena Meineke B. Rutherford Nail M.S., [place], [place] Office [phone]

## 2021-05-21 ENCOUNTER — Ambulatory Visit: Payer: Self-pay

## 2021-05-21 ENCOUNTER — Ambulatory Visit: Payer: Self-pay | Admitting: Speech Pathology

## 2021-05-27 ENCOUNTER — Telehealth: Payer: Self-pay | Admitting: Nurse Practitioner

## 2021-05-27 ENCOUNTER — Other Ambulatory Visit: Payer: Self-pay

## 2021-05-27 ENCOUNTER — Other Ambulatory Visit: Payer: Self-pay | Admitting: Nurse Practitioner

## 2021-05-27 ENCOUNTER — Ambulatory Visit: Payer: Self-pay | Admitting: Occupational Therapy

## 2021-05-27 NOTE — Telephone Encounter (Signed)
I called Mr. Kannan to confirm f/u PC visit, Mr. Hartfield put Mrs. Ricketts on the phone. Mrs. Urquilla endorses she does not need PC and would like to cancel appointment and d/c from services. Notified PC admit

## 2021-05-28 ENCOUNTER — Other Ambulatory Visit: Payer: Self-pay

## 2021-05-28 ENCOUNTER — Ambulatory Visit: Payer: Self-pay | Attending: Physician Assistant | Admitting: Speech Pathology

## 2021-05-28 DIAGNOSIS — G931 Anoxic brain damage, not elsewhere classified: Secondary | ICD-10-CM | POA: Insufficient documentation

## 2021-05-28 DIAGNOSIS — R2681 Unsteadiness on feet: Secondary | ICD-10-CM | POA: Insufficient documentation

## 2021-05-28 DIAGNOSIS — R278 Other lack of coordination: Secondary | ICD-10-CM | POA: Insufficient documentation

## 2021-05-28 DIAGNOSIS — R41841 Cognitive communication deficit: Secondary | ICD-10-CM | POA: Insufficient documentation

## 2021-05-28 DIAGNOSIS — M6281 Muscle weakness (generalized): Secondary | ICD-10-CM | POA: Insufficient documentation

## 2021-05-29 ENCOUNTER — Encounter: Payer: Self-pay | Admitting: Speech Pathology

## 2021-05-29 NOTE — Therapy (Signed)
[place] MAIN [facility] SERVICES [address] [place], [place], [address] Phone: [phone]   Fax:  [phone]  Speech Language Pathology Treatment RE-CERTIFICATION Speech Therapy Progress Note   Dates of reporting period  03/18/2021  to   05/28/2021    Patient Details  Name: [name] [name] MRN: [id number] Date of Birth: 1966/01/20 Referring Provider (SLP): Marlowe [place], [place]   Encounter Date: 05/28/2021   End of Session - 05/29/21 1533     Visit Number 20    Number of Visits 14    Date for SLP Re-Evaluation 08/21/21    Authorization Type Medicaid    Authorization Time Period 05/28/2021 THRU 08/21/2021   Authorization - Visit Number 10    Progress Note Due on Visit 10    SLP Start Time 1511    SLP Stop Time  1600    SLP Time Calculation (min) 49 min    Activity Tolerance Patient tolerated treatment well             Past Medical History:  Diagnosis Date   Anoxic brain injury ([place])    Cardiac arrest ([place])    Cognitive communication deficit    Hyperlipidemia    Hypertension    Seizures ([place])    Ventricular fibrillation ([place])     Past Surgical History:  Procedure Laterality Date   PEG PLACEMENT N/A 12/13/2020   Procedure: PERCUTANEOUS ENDOSCOPIC GASTROSTOMY (PEG) PLACEMENT;  Surgeon: Lesly Rubenstein, MD;  Location: [facility] ENDOSCOPY;  Service: Endoscopy;  Laterality: N/A;   TRACHEOSTOMY TUBE PLACEMENT N/A 12/13/2020   Procedure: TRACHEOSTOMY;  Surgeon: Clyde Canterbury, MD;  Location: [facility] ORS;  Service: ENT;  Laterality: N/A;    There were no vitals filed for this visit.   Subjective Assessment - 05/29/21 1531     Subjective "it is so good to see you, I have missed you"    Patient is accompained by: Family member    Currently in Pain? No/denies                   ADULT SLP TREATMENT - 05/29/21 0001       Treatment Provided   Treatment provided Cognitive-Linquistic      Cognitive-Linquistic Treatment    Treatment focused on Cognition;Patient/family/caregiver education    Skilled Treatment Speech Intelligibility: greatly reduced speech intelligibility resulting in ~ 50% speech intelligibility during conversation d/t fast rate and imprecise articulation; began administration of Cognitive Linguistic Quick Test              SLP Education - 05/29/21 1532     Education Details decreased speech intelligibility    Person(s) Educated Patient;Spouse    Methods Explanation;Verbal cues    Comprehension Need further instruction              SLP Short Term Goals - 05/29/21 1536       SLP SHORT TERM GOAL #1   Title Pt will independently demonstrate selective attention in mildly distracting environment for 15 minutes.    Status Achieved      SLP SHORT TERM GOAL #2   Title With moderate cues, pt will utilize speech intelligibility strategies to achieve > 90% intelligibility at the sentence level.    Baseline Goal met, revised    Time 10    Period --   sessions   Status Revised      SLP SHORT TERM GOAL #3   Title With minimal cues, pt will utilize compensatory memory strategies to recall  therapy sessions for week as well as order of therapy in 8 out of 10 sessions.    Baseline goal met, goal upgraded    Time 10    Period --   sessions   Status Revised      SLP SHORT TERM GOAL #4   Title With moderate assistance, pt will complete basic problem solving tasks with >95% accuracy.    Time 10    Period --   sessions   Status On-going              SLP Long Term Goals - 05/29/21 1538       SLP LONG TERM GOAL #2   Title Pt will independently complete functional problem solving for basic and familiar tasks.    Time 12    Period Weeks    Status On-going    Target Date 08/21/21      SLP LONG TERM GOAL #3   Title Pt will demonstrate selective attention in a moderately distracting environment for 15 minutes with < 1 redirection to task.    Time 12    Period Weeks    Status  On-going    Target Date 08/21/21      SLP LONG TERM GOAL #4   Title With minimal cues, pt will increase speech intelligibility to > 75% at the conversation level.    Baseline goal met, goal updated    Time 12    Period Weeks    Status Revised    Target Date 08/21/21              Plan - 05/29/21 1534     Clinical Impression Statement Pt presents with moderate cognitive communication impairment c/b deficits in all areas of cognition - memory, problem solving, divided attention, awareness and impulsivity. She has missed 2 weeks of therapy d/t illness. Today she presents with significantly reduced speech intelligibility. She is unaware of this and was surprised by SLP's cues to slow rate to improve her intelligibility. In an effort to capture current cognitive ability, we began administration of the Cognitive Linguistic Quick Test. Plan made to complete all subtests in her next session. Will request re-certification for an additional 12 weeks of skilled ST as it remains necessary to target the abovementioned impairments  in order to increase pt's functional independence and ability to participate in family/community/leisure activities thereby reducing pt's caregiver's burden.    Speech Therapy Frequency 2x / week    Duration 12 weeks    Treatment/Interventions Diet toleration management by SLP;Cognitive reorganization;Compensatory techniques;Internal/external aids;SLP instruction and feedback;Patient/family education;Functional tasks;Compensatory strategies    Potential to Achieve Goals Good    Potential Considerations Severity of impairments;Ability to learn/carryover information    Consulted and Agree with Plan of Care Patient;Family member/caregiver    Family Member Consulted pt's husband             Patient will benefit from skilled therapeutic intervention in order to improve the following deficits and impairments:   Cognitive communication deficit  Anoxic brain injury  [facility])    Problem List Patient Active Problem List   Diagnosis Date Noted   Multiple lung nodules 03/27/2021   Dysarthria and anarthria 02/11/2021   Sinus tachycardia    Labile blood glucose    Dysphagia    Seizure prophylaxis    Malnutrition of moderate degree 01/23/2021   Anoxic brain injury ([place]) 01/10/2021   Prediabetes    Dyslipidemia    S/P percutaneous endoscopic gastrostomy (PEG) tube placement ([place])  Status post tracheostomy ([place])    Aspiration pneumonia of both lower lobes due to gastric secretions ([place]) 01/05/2021   Acute respiratory failure ([facility])    Hypoxic ischemic encephalopathy    Endotracheal tube present    Cardiac arrest ([place]) 12/02/2020   Essential hypertension 12/09/2011   Caelan Branden B. Rutherford Nail M.S., CCC-SLP, [place] Pathologist Rehabilitation Services Office [phone]  Stormy Fabian 05/29/2021, 3:40 PM  [place] MAIN [facility] SERVICES [address] [place], [place], [address] Phone: [phone]   Fax:  [phone]   Name: [name] [name] MRN: [id number] Date of Birth: [birth year]

## 2021-05-30 ENCOUNTER — Ambulatory Visit: Payer: Self-pay | Admitting: Speech Pathology

## 2021-05-30 ENCOUNTER — Other Ambulatory Visit: Payer: Self-pay

## 2021-05-30 ENCOUNTER — Ambulatory Visit: Payer: Self-pay

## 2021-05-30 DIAGNOSIS — G931 Anoxic brain damage, not elsewhere classified: Secondary | ICD-10-CM

## 2021-05-30 DIAGNOSIS — R278 Other lack of coordination: Secondary | ICD-10-CM

## 2021-05-30 DIAGNOSIS — R41841 Cognitive communication deficit: Secondary | ICD-10-CM

## 2021-05-30 DIAGNOSIS — M6281 Muscle weakness (generalized): Secondary | ICD-10-CM

## 2021-05-30 NOTE — Therapy (Addendum)
[name] MAIN [facility] SERVICES [address] [place], [place], [address] Phone: [phone]   Fax:  [phone]  Occupational Therapy Treatment/Recertification  Patient Details  Name: [name] [name] MRN: [id number] Date of Birth: 10/18/1966 No data recorded  Encounter Date: 05/30/2021   OT End of Session - 06/02/21 1400     Visit Number 15    Number of Visits 39    Date for OT Re-Evaluation 08/21/21    Authorization Type Progress reports period starting 02/11/2021    OT Start Time 0855    OT Stop Time 0935    OT Time Calculation (min) 40 min    Activity Tolerance Patient tolerated treatment well    Behavior During Therapy [facility] for tasks assessed/performed             Past Medical History:  Diagnosis Date   Anoxic brain injury ([name] [name])    Cardiac arrest ([name] [name])    Cognitive communication deficit    Hyperlipidemia    Hypertension    Seizures ([name] [name])    Ventricular fibrillation ([name] [name])     Past Surgical History:  Procedure Laterality Date   PEG PLACEMENT N/A 12/13/2020   Procedure: PERCUTANEOUS ENDOSCOPIC GASTROSTOMY (PEG) PLACEMENT;  Surgeon: Lesly Rubenstein, MD;  Location: [facility] ENDOSCOPY;  Service: Endoscopy;  Laterality: N/A;   TRACHEOSTOMY TUBE PLACEMENT N/A 12/13/2020   Procedure: TRACHEOSTOMY;  Surgeon: Clyde Canterbury, MD;  Location: [facility] ORS;  Service: ENT;  Laterality: N/A;    There were no vitals filed for this visit.   Subjective Assessment - 06/02/21 1359     Subjective  "I want to keep working on my strength and I need to work on my hand writing."    Patient is accompanied by: Family member    Pertinent History Pt. is a 55 y.o. female who went into cardiac arrest, and was admitted to [facility] on 12/02/2020. Pt. was diagnosed with Anoxic Brain Injury following the Cardiac Arrest. When medically stable, the pt. was tranferred to [facility]. The patient was discharged home on 02/08/2021, and is ready to begin  outpatient OT services. Pt. was independent, and working prior to the onset. Pt. Has very supportive family. Pt. Enjoys gardening.             Occupational Therapy Treatment: Therapeutic Exercise: Pt participated in RUE strengthening to improve UB ADL performance.  Completed R chest press, R shoulder flex/abduction/ER, elbow flex/ext, forearm pron/sup, and wrist flex/ext using 3 lb dumbbell x2 sets 10 reps each.  Intermittent CGA necessary for accurate completion as pt had difficulty following return demo with a visual cue.  Self Care: Pt participated in handwriting activity, copying from a "fill in the blank" sentence from therapist.  Pt begins with good legibility but declines after 3 sentences from good to fair legibility.  Pt also requires cues for spacing and extra time for processing when completing a cognitive challenge such as finishing a sequence of all the months in a year.  OT encouraged 1 finger tip spacing between each word, which pt was unable to mimic.  Completed clock drawing with pt's spacing spacing noted to be very impaired.    Response to treatment: Pt has been coming to OT post anoxic brain injury, sustaining deficits including RUE weakness, FMC deficits, cognitive deficits, and visual/spatial deficits, all causing decline in ADLs.  Pt has been making excellent progress towards goals.  Recent respiratory illness paused treatment briefly, but pt is now feeling better and continues  to progress towards OT goals.  Good tolerance to 3lb weight this day with short rest breaks between exercises.  Handwriting continues to be impaired as noted by hand fatigue contributing to decline in legibility of words after a few sentences.  Sentence writing also impaired by moderate-severe visual spatial deficits, with pt requiring max vc to increase spacing and scan to the R side of the paper.  Pt required max vc for initiating an organized clock drawing by starting with the 12, 3, 6, and 9 to  improve spacing, and max vc for pt to scan to the edge of the clock with each number to avoid drawing numbers toward the middle of the clock.  Continued skilled OT will benefit pt to focus on visual/spatial abilities that affect ADLs, and continue with improving RUE strength and coordination to maximize ADL/IADL performance.     OT Education - 06/02/21 1359     Education Details encouraged pt obtain jumbo word search to work on at home to improve visual scanning/R sided attention    Person(s) Educated Patient;Child(ren)    Methods Explanation;Demonstration    Comprehension Verbalized understanding;Returned demonstration               OT Long Term Goals - 06/02/21 1404       OT LONG TERM GOAL #1   Title Pt. will be able to independently sign her name with 100% legibility.    Baseline Pt. continues to present with limited signature. Eval: Signature not legible, however was able to sign on the line with no deviation above, or below the line.  Recert: Signature with 75% legibility. Spacing words on a line is impaired and requires extensive cueing to scan L to R across the paper.    Time 12    Period Weeks    Status On-going    Target Date 08/21/21      OT LONG TERM GOAL #2   Title Pt. will increase right shoulder AROM to be able to independently retrieve items from closets, and cabinets.    Baseline Pt. has improved with AROM, and is now able to retrieve items from closets, and cabinets. Eval: Pt. presents with limited ROM. Recert: Pt has good AROM overhead but repetitive reaching causes fatigue and need for rest breaks.    Time 12    Period Weeks    Status Partially Met    Target Date 08/21/21      OT LONG TERM GOAL #3   Title Pt. will increase right grip strength by 5# to be able hold a full glass    Baseline Pt. is improving with formulating a composite fist. Eval: limited grip strength. Limited bilateral digit flexion, unable to make a full composite fist.    Time 12    Period  Weeks    Status On-going    Target Date 08/21/21      OT LONG TERM GOAL #4   Title Pt. will independently access a computer, and type a one sentence email correspondence.    Baseline Pt. is able to type a one sentence correspondence with multiple mistypes. Eval: Pt. is unable to to perform    Time 12    Period Weeks    Status On-going    Target Date 08/21/21      OT LONG TERM GOAL #5   Title Pt. will improve bilateral [facility] skills to be able to grasp, and manipulate small objects during ADLs.    Baseline Pt. continues to have difficulty grasping,  and manipulating small objects. Eval: Pt. presents with limited [facility] skills with difficulty picking,and manipulating small objects.    Time 12    Period Weeks    Status On-going    Target Date 08/21/21      Long Term Additional Goals   Additional Long Term Goals Yes      OT LONG TERM GOAL #6   Title Pt. will improve UE strength by 2 mm grades to assist with ADLs, and IADLS,    Baseline Pt. continues to present with limited BUE stregth. Pt. is engaging in more ADL,a nd IADL tasks at home. Eval: pt. has difficulty performing.    Time 12    Period Weeks    Status On-going    Target Date 08/21/21      OT LONG TERM GOAL #7   Title Pt. will complete light meal preparation with minA    Baseline Pt. is preparing meals with her daughter's assist. Eval: Pt. is unable to prepare light meal preparation.    Time 12    Period Weeks    Status On-going    Target Date 08/21/21      OT LONG TERM GOAL #8   Title Pt. will perform light home management tasks with minA    Baseline Pt. is engaging in more home management tasks. Eval: pt. is unable to perform    Time 12    Period Weeks    Status On-going    Target Date 08/21/21      OT LONG TERM GOAL  #9   TITLE Pt will demonstrate improved visual/spatial skills as demonstrated by ability to adequately space words in a sentence with min vc.    Baseline Recert: Pt requires max vc. for spacing.    Time  12    Period Weeks    Status New    Target Date 08/21/21                   Plan - 06/02/21 1403     Clinical Impression Statement Good tolerance to 3lb weight this day with short rest breaks between exercises.  Handwriting continues to be impaired as noted by hand fatigue contributing to decline in legibility of words after a few sentences.  Sentence writing also impaired by moderate-severe visual spatial deficits, with pt requiring max vc to increase spacing and scan to the R side of the paper.  Pt required max vc for initiating an organized clock drawing by starting with the 12, 3, 6, and 9 to improve spacing, and max vc for pt to scan to the edge of the clock with each number to avoid drawing numbers toward the middle of the clock.  Continued skilled OT will benefit pt to focus on visual/spatial abilities that affect ADLs, and continue with improving RUE strength and coordination to maximize ADL/IADL performance.    OT Occupational Profile and History Problem Focused Assessment - Including review of records relating to presenting problem    Occupational performance deficits (Please refer to evaluation for details): ADL's;IADL's    Body Structure / Function / Physical Skills ADL;IADL;FMC;ROM;Strength    Rehab Potential Good    Clinical Decision Making Several treatment options, min-mod task modification necessary    Comorbidities Affecting Occupational Performance: May have comorbidities impacting occupational performance    Modification or Assistance to Complete Evaluation  Min-Moderate modification of tasks or assist with assess necessary to complete eval    OT Frequency 2x / week    OT Duration  12 weeks    OT Treatment/Interventions Self-care/ADL training;Neuromuscular education;Therapeutic activities;Patient/family education;DME and/or AE instruction;Visual/perceptual remediation/compensation    Consulted and Agree with Plan of Care Patient             Patient will benefit  from skilled therapeutic intervention in order to improve the following deficits and impairments:   Body Structure / Function / Physical Skills: ADL, IADL, FMC, ROM, Strength       Visit Diagnosis: Anoxic brain injury ([place])  Muscle weakness (generalized)  Other lack of coordination  Cognitive communication deficit    Problem List Patient Active Problem List   Diagnosis Date Noted   Multiple lung nodules 03/27/2021   Dysarthria and anarthria 02/11/2021   Sinus tachycardia    Labile blood glucose    Dysphagia    Seizure prophylaxis    Malnutrition of moderate degree 01/23/2021   Anoxic brain injury ([place]) 01/10/2021   Prediabetes    Dyslipidemia    S/P percutaneous endoscopic gastrostomy (PEG) tube placement ([place])    Status post tracheostomy ([place])    Aspiration pneumonia of both lower lobes due to gastric secretions ([place]) 01/05/2021   Acute respiratory failure ([place])    Hypoxic ischemic encephalopathy    Endotracheal tube present    Cardiac arrest ([place]) 12/02/2020   Essential hypertension 12/09/2011   Leta Speller, MS, OTR/L  Darleene Cleaver 06/02/2021, 2:17 PM  [place] MAIN [facility] SERVICES [address] [place], [place], [address] Phone: [phone]   Fax:  [phone]  Name: EATHEL PAJAK MRN: [id number] Date of Birth: [birth year]

## 2021-05-30 NOTE — Patient Instructions (Signed)
Use preplanning and sequencing to complete 2 basic mazes

## 2021-05-30 NOTE — Therapy (Signed)
[place] MAIN [facility] SERVICES [address] [place], [place], [address] Phone: [phone]   Fax:  [phone]  Speech Language Pathology Treatment  Patient Details  Name: [name] [name] MRN: [id number] Date of Birth: 06-09-1966 Referring Provider (SLP): [place], [place]   Encounter Date: 05/30/2021   End of Session - 05/30/21 1240     Visit Number 21    Number of Visits 75    Date for SLP Re-Evaluation 08/21/21    Authorization Type Medicaid    Authorization Time Period 05/28/2021    Authorization - Visit Number 1    Progress Note Due on Visit 10    SLP Start Time 0810    SLP Stop Time  0845    SLP Time Calculation (min) 35 min    Activity Tolerance Patient tolerated treatment well             Past Medical History:  Diagnosis Date   Anoxic brain injury ([place])    Cardiac arrest ([place])    Cognitive communication deficit    Hyperlipidemia    Hypertension    Seizures ([place])    Ventricular fibrillation ([place])     Past Surgical History:  Procedure Laterality Date   PEG PLACEMENT N/A 12/13/2020   Procedure: PERCUTANEOUS ENDOSCOPIC GASTROSTOMY (PEG) PLACEMENT;  Surgeon: Lesly Rubenstein, MD;  Location: [facility] ENDOSCOPY;  Service: Endoscopy;  Laterality: N/A;   TRACHEOSTOMY TUBE PLACEMENT N/A 12/13/2020   Procedure: TRACHEOSTOMY;  Surgeon: Clyde Canterbury, MD;  Location: [facility] ORS;  Service: ENT;  Laterality: N/A;    There were no vitals filed for this visit.   Subjective Assessment - 05/30/21 1239     Subjective "I made it in time"    Patient is accompained by: Family member    Currently in Pain? No/denies                   ADULT SLP TREATMENT - 05/30/21 0001       Treatment Provided   Treatment provided Cognitive-Linquistic      Cognitive-Linquistic Treatment   Treatment focused on Cognition;Patient/family/caregiver education    Skilled Treatment Completed administration of the Cognitive Linguistic Quick Test;  demonstration provided of sequencing, preplanning and problem solving basic maze; pt able to execute with SLP assistance              SLP Education - 05/30/21 1239     Education Details progress on the Cognitive Linguistic Quick Test    Person(s) Educated Patient;Spouse    Methods Explanation;Demonstration;Verbal cues;Handout    Comprehension Verbalized understanding;Returned demonstration;Need further instruction              SLP Short Term Goals - 05/29/21 1536       SLP SHORT TERM GOAL #1   Title Pt will independently demonstrate selective attention in mildly distracting environment for 15 minutes.    Status Achieved      SLP SHORT TERM GOAL #2   Title With moderate cues, pt will utilize speech intelligibility strategies to achieve > 90% intelligibility at the sentence level.    Baseline Goal met, revised    Time 10    Period --   sessions   Status Revised      SLP SHORT TERM GOAL #3   Title With minimal cues, pt will utilize compensatory memory strategies to recall therapy sessions for week as well as order of therapy in 8 out of 10 sessions.    Baseline goal met, goal  upgraded    Time 10    Period --   sessions   Status Revised      SLP SHORT TERM GOAL #4   Title With moderate assistance, pt will complete basic problem solving tasks with >95% accuracy.    Time 10    Period --   sessions   Status On-going              SLP Long Term Goals - 05/29/21 1538       SLP LONG TERM GOAL #2   Title Pt will independently complete functional problem solving for basic and familiar tasks.    Time 12    Period Weeks    Status On-going    Target Date 08/21/21      SLP LONG TERM GOAL #3   Title Pt will demonstrate selective attention in a moderately distracting environment for 15 minutes with < 1 redirection to task.    Time 12    Period Weeks    Status On-going    Target Date 08/21/21      SLP LONG TERM GOAL #4   Title With minimal cues, pt will increase  speech intelligibility to > 75% at the conversation level.    Baseline goal met, goal updated    Time 12    Period Weeks    Status Revised    Target Date 08/21/21              Plan - 05/30/21 1241     Clinical Impression Statement Pt demonstrated great improvement on the Cognitive Linguistic Quick Test. 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 This is a significant improvement over previous scores of severe on all areas.    Speech Therapy Frequency 2x / week    Duration 12 weeks    Treatment/Interventions Diet toleration management by SLP;Cognitive reorganization;Compensatory techniques;Internal/external aids;SLP instruction and feedback;Patient/family education;Functional tasks;Compensatory strategies    Potential to Achieve Goals Good    SLP Home Exercise Plan provided, see instruction section    Consulted and Agree with Plan of Care Patient;Family member/caregiver    Family Member Consulted pt's husband             Patient will benefit from skilled therapeutic intervention in order to improve the following deficits and impairments:   Cognitive communication deficit  Anoxic brain injury [facility])    Problem List Patient Active Problem List   Diagnosis Date Noted   Multiple lung nodules 03/27/2021   Dysarthria and anarthria 02/11/2021   Sinus tachycardia    Labile blood glucose    Dysphagia    Seizure prophylaxis    Malnutrition of moderate degree 01/23/2021   Anoxic brain injury ([place]) 01/10/2021   Prediabetes    Dyslipidemia    S/P percutaneous endoscopic gastrostomy (PEG) tube placement [facility])    Status post tracheostomy ([place])    Aspiration pneumonia of both lower lobes due to gastric secretions ([place]) 01/05/2021   Acute respiratory failure ([facility])     Hypoxic ischemic encephalopathy    Endotracheal tube present    Cardiac arrest ([place]) 12/02/2020   Essential hypertension 12/09/2011   Eldora Napp B. Rutherford Nail M.S., CCC-SLP, [place] Pathologist Rehabilitation Services Office [phone]  Stormy Fabian 05/30/2021, 12:42 PM  [place] MAIN [facility] SERVICES [address] [place], [place], [address] Phone: [phone]  Fax:  [phone]   Name: [name] [name] MRN: [id number] Date of Birth: 20-May-1966

## 2021-06-02 ENCOUNTER — Ambulatory Visit: Payer: Self-pay | Admitting: Speech Pathology

## 2021-06-02 ENCOUNTER — Ambulatory Visit: Payer: Self-pay | Admitting: Occupational Therapy

## 2021-06-02 ENCOUNTER — Encounter: Payer: Self-pay | Admitting: Occupational Therapy

## 2021-06-02 ENCOUNTER — Other Ambulatory Visit: Payer: Self-pay

## 2021-06-02 DIAGNOSIS — R41841 Cognitive communication deficit: Secondary | ICD-10-CM

## 2021-06-02 DIAGNOSIS — G931 Anoxic brain damage, not elsewhere classified: Secondary | ICD-10-CM

## 2021-06-02 DIAGNOSIS — M6281 Muscle weakness (generalized): Secondary | ICD-10-CM

## 2021-06-02 DIAGNOSIS — R278 Other lack of coordination: Secondary | ICD-10-CM

## 2021-06-02 NOTE — Therapy (Signed)
[place] MAIN [facility] SERVICES [address] [place], [place], [address] Phone: [phone]   Fax:  [phone]  Occupational Therapy Treatment  Patient Details  Name: [name] [name] MRN: [id number] Date of Birth: 06/14/1966 No data recorded  Encounter Date: 06/02/2021   OT End of Session - 06/02/21 1449     Visit Number 16    Number of Visits 39    Date for OT Re-Evaluation 08/21/21    Authorization Type Progress reports period starting 02/11/2021    OT Start Time 1435    OT Stop Time 1515    OT Time Calculation (min) 40 min    Activity Tolerance Patient tolerated treatment well    Behavior During Therapy [facility] for tasks assessed/performed             Past Medical History:  Diagnosis Date   Anoxic brain injury ([place])    Cardiac arrest ([place])    Cognitive communication deficit    Hyperlipidemia    Hypertension    Seizures ([place])    Ventricular fibrillation ([place])     Past Surgical History:  Procedure Laterality Date   PEG PLACEMENT N/A 12/13/2020   Procedure: PERCUTANEOUS ENDOSCOPIC GASTROSTOMY (PEG) PLACEMENT;  Surgeon: Lesly Rubenstein, MD;  Location: [facility] ENDOSCOPY;  Service: Endoscopy;  Laterality: N/A;   TRACHEOSTOMY TUBE PLACEMENT N/A 12/13/2020   Procedure: TRACHEOSTOMY;  Surgeon: Clyde Canterbury, MD;  Location: [facility] ORS;  Service: ENT;  Laterality: N/A;    There were no vitals filed for this visit.   Subjective Assessment - 06/02/21 1448     Subjective  Pt. reports doing well today.    Patient is accompanied by: Family member    Pertinent History Pt. is a 55 y.o. female who went into cardiac arrest, and was admitted to [facility] on 12/02/2020. Pt. was diagnosed with Anoxic Brain Injury following the Cardiac Arrest. When medically stable, the pt. was tranferred to [facility]. The patient was discharged home on 02/08/2021, and is ready to begin outpatient OT services. Pt. was independent, and working prior to the  onset. Pt. Has very supportive family. Pt. Enjoys gardening.    Currently in Pain? No/denies            OT TREATMENT    Therapeutic Exercise  Pt. worked on BUE strengthening, and reciprocal motion using the UBE in standing for 8 min. with no resistance. Constant monitoring was provided. Pt. performed 3# dowel ex. For UE strengthening secondary to weakness. Bilateral shoulder flexion, chest press, circular patterns, and elbow flexion/extension were performed. 3# dumbbell ex. for elbow flexion and extension forearm supination/pronation, wrist flexion/extension, and radial deviation. Pt. requires rest breaks and verbal cues for proper technique.   Selfcare:  Pt. worked on Building surveyor, Lobbyist items at a picnic that she went to using a fill in the blank format while progressively decreasing the amount of words provided.   Pt. continues to make progress. Pt. tolerated the 3# exercises well, however required consistent cues for motor planning, form, and technique. Pt. required verbal cues for spacing between each word. Pt. was able to maintain a mature grasp on a standard pencil, and pen. Pt. did present with increases erasing initially, with less erasing required as the task progressed. Pt. continues to work on improving UE strength, and and coordination skills in order to improve writing legibility.  OT Education - 06/02/21 1449     Education Details encouraged pt obtain jumbo word search to work on at home to improve visual scanning/R sided attention    Person(s) Educated Patient;Child(ren)    Methods Explanation;Demonstration    Comprehension Verbalized understanding;Returned demonstration                 OT Long Term Goals - 06/02/21 1404       OT LONG TERM GOAL #1   Title Pt. will be able to independently sign her name with 100% legibility.    Baseline Pt. continues to present with limited signature. Eval:  Signature not legible, however was able to sign on the line with no deviation above, or below the line.  Recert: Signature with 75% legibility. Spacing words on a line is impaired and requires extensive cueing to scan L to R across the paper.    Time 12    Period Weeks    Status On-going    Target Date 08/21/21      OT LONG TERM GOAL #2   Title Pt. will increase right shoulder AROM to be able to independently retrieve items from closets, and cabinets.    Baseline Pt. has improved with AROM, and is now able to retrieve items from closets, and cabinets. Eval: Pt. presents with limited ROM. Recert: Pt has good AROM overhead but repetitive reaching causes fatigue and need for rest breaks.    Time 12    Period Weeks    Status Partially Met    Target Date 08/21/21      OT LONG TERM GOAL #3   Title Pt. will increase right grip strength by 5# to be able hold a full glass    Baseline Pt. is improving with formulating a composite fist. Eval: limited grip strength. Limited bilateral digit flexion, unable to make a full composite fist.    Time 12    Period Weeks    Status On-going    Target Date 08/21/21      OT LONG TERM GOAL #4   Title Pt. will independently access a computer, and type a one sentence email correspondence.    Baseline Pt. is able to type a one sentence correspondence with multiple mistypes. Eval: Pt. is unable to to perform    Time 12    Period Weeks    Status On-going    Target Date 08/21/21      OT LONG TERM GOAL #5   Title Pt. will improve bilateral [facility] skills to be able to grasp, and manipulate small objects during ADLs.    Baseline Pt. continues to have difficulty grasping, and manipulating small objects. Eval: Pt. presents with limited [facility] skills with difficulty picking,and manipulating small objects.    Time 12    Period Weeks    Status On-going    Target Date 08/21/21      Long Term Additional Goals   Additional Long Term Goals Yes      OT LONG TERM GOAL #6    Title Pt. will improve UE strength by 2 mm grades to assist with ADLs, and IADLS,    Baseline Pt. continues to present with limited BUE stregth. Pt. is engaging in more ADL,a nd IADL tasks at home. Eval: pt. has difficulty performing.    Time 12    Period Weeks    Status On-going    Target Date 08/21/21      OT LONG TERM GOAL #7   Title Pt. will  complete light meal preparation with minA    Baseline Pt. is preparing meals with her daughter's assist. Eval: Pt. is unable to prepare light meal preparation.    Time 12    Period Weeks    Status On-going    Target Date 08/21/21      OT LONG TERM GOAL #8   Title Pt. will perform light home management tasks with minA    Baseline Pt. is engaging in more home management tasks. Eval: pt. is unable to perform    Time 12    Period Weeks    Status On-going    Target Date 08/21/21      OT LONG TERM GOAL  #9   TITLE Pt will demonstrate improved visual/spatial skills as demonstrated by ability to adequately space words in a sentence with min vc.    Baseline Recert: Pt requires max vc. for spacing.    Time 12    Period Weeks    Status New    Target Date 08/21/21                   Plan - 06/02/21 1451     Clinical Impression Statement Pt. continues to make progress. Pt. tolerated the 3# exercises well, however required consistent cues for motor planning, form, and technique. Pt. required verbal cues for spacing between each word. Pt. was able to maintain a mature grasp on a standard pencil, and pen. Pt. did present with increases erasing initially, with less erasing required as the task progressed. Pt. continues to work on improving UE strength, and and coordination skills in order to improve writing legibility.    Occupational performance deficits (Please refer to evaluation for details): ADL's;IADL's    Body Structure / Function / Physical Skills ADL;IADL;FMC;ROM;Strength    Rehab Potential Good    Clinical Decision Making Several  treatment options, min-mod task modification necessary    Comorbidities Affecting Occupational Performance: May have comorbidities impacting occupational performance    Modification or Assistance to Complete Evaluation  Min-Moderate modification of tasks or assist with assess necessary to complete eval    OT Frequency 2x / week    OT Duration 12 weeks    OT Treatment/Interventions Self-care/ADL training;Neuromuscular education;Therapeutic activities;Patient/family education;DME and/or AE instruction;Visual/perceptual remediation/compensation    Consulted and Agree with Plan of Care Patient             Patient will benefit from skilled therapeutic intervention in order to improve the following deficits and impairments:   Body Structure / Function / Physical Skills: ADL, IADL, FMC, ROM, Strength       Visit Diagnosis: Muscle weakness (generalized)  Other lack of coordination    Problem List Patient Active Problem List   Diagnosis Date Noted   Multiple lung nodules 03/27/2021   Dysarthria and anarthria 02/11/2021   Sinus tachycardia    Labile blood glucose    Dysphagia    Seizure prophylaxis    Malnutrition of moderate degree 01/23/2021   Anoxic brain injury ([place]) 01/10/2021   Prediabetes    Dyslipidemia    S/P percutaneous endoscopic gastrostomy (PEG) tube placement ([facility])    Status post tracheostomy ([place])    Aspiration pneumonia of both lower lobes due to gastric secretions ([place]) 01/05/2021   Acute respiratory failure ([facility])    Hypoxic ischemic encephalopathy    Endotracheal tube present    Cardiac arrest ([place]) 12/02/2020   Essential hypertension 12/09/2011    Harrel Carina, MS, OTR/L 06/02/2021, 2:59 PM  [facility]  [place] MAIN [facility] SERVICES [address] [place], [place], [address] Phone: [phone]   Fax:  [phone]  Name: MAVEN ROSANDER MRN: [id number] Date of Birth: [birth year]

## 2021-06-02 NOTE — Addendum Note (Signed)
Addended by: Darleene Cleaver on: 06/02/2021 02:21 PM   Modules accepted: Orders

## 2021-06-03 ENCOUNTER — Ambulatory Visit: Payer: Self-pay | Admitting: Occupational Therapy

## 2021-06-03 ENCOUNTER — Ambulatory Visit: Payer: Self-pay | Admitting: Speech Pathology

## 2021-06-03 NOTE — Patient Instructions (Signed)
Complete the semi-complex puzzles for homework - making sure to stop at each "decision point"

## 2021-06-04 ENCOUNTER — Ambulatory Visit: Payer: Self-pay | Admitting: Speech Pathology

## 2021-06-04 ENCOUNTER — Ambulatory Visit: Payer: Self-pay | Admitting: Occupational Therapy

## 2021-06-04 ENCOUNTER — Other Ambulatory Visit: Payer: Self-pay

## 2021-06-04 ENCOUNTER — Encounter: Payer: Self-pay | Admitting: Occupational Therapy

## 2021-06-04 DIAGNOSIS — R278 Other lack of coordination: Secondary | ICD-10-CM

## 2021-06-04 DIAGNOSIS — G931 Anoxic brain damage, not elsewhere classified: Secondary | ICD-10-CM

## 2021-06-04 DIAGNOSIS — R41841 Cognitive communication deficit: Secondary | ICD-10-CM

## 2021-06-04 DIAGNOSIS — M6281 Muscle weakness (generalized): Secondary | ICD-10-CM

## 2021-06-04 NOTE — Therapy (Signed)
[place] MAIN [facility] SERVICES [address] [place], [place], [address] Phone: [phone]   Fax:  [phone]  Occupational Therapy Treatment  Patient Details  Name: [name] [name] MRN: [id number] Date of Birth: 10-20-1966 No data recorded  Encounter Date: 06/04/2021   OT End of Session - 06/04/21 1759     Visit Number 17    Number of Visits 75    Date for OT Re-Evaluation 08/21/21    Authorization Type Progress reports period starting 02/11/2021    OT Start Time 1505    OT Stop Time 1545    OT Time Calculation (min) 40 min    Activity Tolerance Patient tolerated treatment well    Behavior During Therapy [facility] for tasks assessed/performed             Past Medical History:  Diagnosis Date   Anoxic brain injury ([place])    Cardiac arrest ([place])    Cognitive communication deficit    Hyperlipidemia    Hypertension    Seizures ([place])    Ventricular fibrillation ([place])     Past Surgical History:  Procedure Laterality Date   PEG PLACEMENT N/A 12/13/2020   Procedure: PERCUTANEOUS ENDOSCOPIC GASTROSTOMY (PEG) PLACEMENT;  Surgeon: Lesly Rubenstein, MD;  Location: [facility] ENDOSCOPY;  Service: Endoscopy;  Laterality: N/A;   TRACHEOSTOMY TUBE PLACEMENT N/A 12/13/2020   Procedure: TRACHEOSTOMY;  Surgeon: Clyde Canterbury, MD;  Location: [facility] ORS;  Service: ENT;  Laterality: N/A;    There were no vitals filed for this visit.   Pt. was administered the MVPT  to help determine how visual perceptual skills  may be affecting ADL, and IADL functioning. Pt. RAW score 25/36. Pt. Answered 26/36 total left sided responses, and 10/36 total right sided responses. Pt. Performance behavior revealed 16 correct left sided responses out of 21 expected left sided responses. Pt. presented with 9 correct right sided responses out of 15 expected correct right sided responses. Pt. misidentified 2/5 figure ground items, 1/5 visual discrimination items, 1/8 visual memory  items, and 7/11 visual closure items. Pt. required increased time for processing throughout the assessment. Will continue to assess visual perceptual functioning in order to help to determine how performance in these areas may be affecting ADL, and IADL functioning, and to assist with providing pt./caregiver education about visual compensatory strategies during ADLs, and IADLs.                   OT Education - 06/04/21 1758     Education Details Visual perceptual skills.    Person(s) Educated Patient;Child(ren)    Methods Explanation;Demonstration    Comprehension Verbalized understanding;Returned demonstration                 OT Long Term Goals - 06/02/21 1404       OT LONG TERM GOAL #1   Title Pt. will be able to independently sign her name with 100% legibility.    Baseline Pt. continues to present with limited signature. Eval: Signature not legible, however was able to sign on the line with no deviation above, or below the line.  Recert: Signature with 75% legibility. Spacing words on a line is impaired and requires extensive cueing to scan L to R across the paper.    Time 12    Period Weeks    Status On-going    Target Date 08/21/21      OT LONG TERM GOAL #2   Title Pt. will increase right shoulder AROM  to be able to independently retrieve items from closets, and cabinets.    Baseline Pt. has improved with AROM, and is now able to retrieve items from closets, and cabinets. Eval: Pt. presents with limited ROM. Recert: Pt has good AROM overhead but repetitive reaching causes fatigue and need for rest breaks.    Time 12    Period Weeks    Status Partially Met    Target Date 08/21/21      OT LONG TERM GOAL #3   Title Pt. will increase right grip strength by 5# to be able hold a full glass    Baseline Pt. is improving with formulating a composite fist. Eval: limited grip strength. Limited bilateral digit flexion, unable to make a full composite fist.    Time 12     Period Weeks    Status On-going    Target Date 08/21/21      OT LONG TERM GOAL #4   Title Pt. will independently access a computer, and type a one sentence email correspondence.    Baseline Pt. is able to type a one sentence correspondence with multiple mistypes. Eval: Pt. is unable to to perform    Time 12    Period Weeks    Status On-going    Target Date 08/21/21      OT LONG TERM GOAL #5   Title Pt. will improve bilateral [facility] skills to be able to grasp, and manipulate small objects during ADLs.    Baseline Pt. continues to have difficulty grasping, and manipulating small objects. Eval: Pt. presents with limited [facility] skills with difficulty picking,and manipulating small objects.    Time 12    Period Weeks    Status On-going    Target Date 08/21/21      Long Term Additional Goals   Additional Long Term Goals Yes      OT LONG TERM GOAL #6   Title Pt. will improve UE strength by 2 mm grades to assist with ADLs, and IADLS,    Baseline Pt. continues to present with limited BUE stregth. Pt. is engaging in more ADL,a nd IADL tasks at home. Eval: pt. has difficulty performing.    Time 12    Period Weeks    Status On-going    Target Date 08/21/21      OT LONG TERM GOAL #7   Title Pt. will complete light meal preparation with minA    Baseline Pt. is preparing meals with her daughter's assist. Eval: Pt. is unable to prepare light meal preparation.    Time 12    Period Weeks    Status On-going    Target Date 08/21/21      OT LONG TERM GOAL #8   Title Pt. will perform light home management tasks with minA    Baseline Pt. is engaging in more home management tasks. Eval: pt. is unable to perform    Time 12    Period Weeks    Status On-going    Target Date 08/21/21      OT LONG TERM GOAL  #9   TITLE Pt will demonstrate improved visual/spatial skills as demonstrated by ability to adequately space words in a sentence with min vc.    Baseline Recert: Pt requires max vc. for spacing.     Time 12    Period Weeks    Status New    Target Date 08/21/21  Plan - 06/04/21 1800     Clinical Impression Statement Pt. was administered the MVPT  to help determine how visual perceptual skills  may be affecting ADL, and IADL functioning. Pt. RAW score 25/36. Pt. Answered 26/36 total left sided responses, and 10/36 total right sided responses. Pt. Performance behavior revealed 16 correct left sided responses out of 21 expected left sided responses. Pt. presented with 9 correct right sided responses out of 15 expected correct right sided responses. Pt. misidentified 2/5 figure ground items, 1/5 visual discrimination items, 1/8 visual memory items, and 7/11 visual closure items. Pt. required increased time for processing throughout the assessment. Will continue to assess visual perceptual functioning in order to help to determine how performance in these areas may be affecting ADL, and IADL functioning, and to assist with providing pt./caregiver education about visual compensatory strategies during ADLs, and IADLs.     OT Occupational Profile and History Problem Focused Assessment - Including review of records relating to presenting problem    Occupational performance deficits (Please refer to evaluation for details): ADL's;IADL's    Body Structure / Function / Physical Skills ADL;IADL;FMC;ROM;Strength    Rehab Potential Good    Clinical Decision Making Several treatment options, min-mod task modification necessary    Comorbidities Affecting Occupational Performance: May have comorbidities impacting occupational performance    Modification or Assistance to Complete Evaluation  Min-Moderate modification of tasks or assist with assess necessary to complete eval    OT Frequency 2x / week    OT Duration 12 weeks    OT Treatment/Interventions Self-care/ADL training;Neuromuscular education;Therapeutic activities;Patient/family education;DME and/or AE  instruction;Visual/perceptual remediation/compensation    Consulted and Agree with Plan of Care Patient             Patient will benefit from skilled therapeutic intervention in order to improve the following deficits and impairments:   Body Structure / Function / Physical Skills: ADL, IADL, FMC, ROM, Strength       Visit Diagnosis: Muscle weakness (generalized)  Other lack of coordination    Problem List Patient Active Problem List   Diagnosis Date Noted   Multiple lung nodules 03/27/2021   Dysarthria and anarthria 02/11/2021   Sinus tachycardia    Labile blood glucose    Dysphagia    Seizure prophylaxis    Malnutrition of moderate degree 01/23/2021   Anoxic brain injury ([place]) 01/10/2021   Prediabetes    Dyslipidemia    S/P percutaneous endoscopic gastrostomy (PEG) tube placement ([place])    Status post tracheostomy ([place])    Aspiration pneumonia of both lower lobes due to gastric secretions ([place]) 01/05/2021   Acute respiratory failure ([place])    Hypoxic ischemic encephalopathy    Endotracheal tube present    Cardiac arrest ([place]) 12/02/2020   Essential hypertension 12/09/2011    [name] Carina, MS, OTR/L 06/04/2021, 6:02 PM  [place] MAIN [facility] SERVICES [address] [place], [place], [address] Phone: [phone]   Fax:  [phone]  Name: QUNISHA BRYK MRN: [id number] Date of Birth: 1966/10/20

## 2021-06-04 NOTE — Therapy (Signed)
[place] MAIN [facility] SERVICES [address] [place], [place], [address] Phone: [phone]   Fax:  [phone]  Speech Language Pathology Treatment  Patient Details  Name: [name] [name] MRN: [id number] Date of Birth: February 27, 1966 Referring Provider (SLP): Marlowe Shores, [place]   Encounter Date: 06/02/2021   End of Session - 06/03/21 1533     Visit Number 22    Number of Visits 78    Date for SLP Re-Evaluation 08/21/21    Authorization Type Medicaid    Authorization Time Period 05/28/2021    Authorization - Visit Number 2    Progress Note Due on Visit 10    SLP Start Time 1515    SLP Stop Time  1600    SLP Time Calculation (min) 45 min    Activity Tolerance Patient tolerated treatment well             Past Medical History:  Diagnosis Date   Anoxic brain injury ([place])    Cardiac arrest ([place])    Cognitive communication deficit    Hyperlipidemia    Hypertension    Seizures ([place])    Ventricular fibrillation ([place])     Past Surgical History:  Procedure Laterality Date   PEG PLACEMENT N/A 12/13/2020   Procedure: PERCUTANEOUS ENDOSCOPIC GASTROSTOMY (PEG) PLACEMENT;  Surgeon: Lesly Rubenstein, MD;  Location: [facility] ENDOSCOPY;  Service: Endoscopy;  Laterality: N/A;   TRACHEOSTOMY TUBE PLACEMENT N/A 12/13/2020   Procedure: TRACHEOSTOMY;  Surgeon: Clyde Canterbury, MD;  Location: [facility] ORS;  Service: ENT;  Laterality: N/A;    There were no vitals filed for this visit.   Subjective Assessment - 06/03/21 1529     Subjective "I am doing ok" "at least I did them" referring to homework    Patient is accompained by: Family member    Currently in Pain? No/denies                   ADULT SLP TREATMENT - 06/03/21 0001       Treatment Provided   Treatment provided Cognitive-Linquistic      Cognitive-Linquistic Treatment   Treatment focused on Cognition;Patient/family/caregiver education    Skilled Treatment 50% accuracy when problem  solving semi-complex activity increasing to 100% with moderate cues for task organization, basic problem solving, self-monitoring and self-correcting              SLP Education - 06/03/21 1533     Education Details executive functions    Person(s) Educated Patient;Spouse    Methods Explanation;Demonstration;Verbal cues;Tactile cues;Handout    Comprehension Need further instruction              SLP Short Term Goals - 05/29/21 1536       SLP SHORT TERM GOAL #1   Title Pt will independently demonstrate selective attention in mildly distracting environment for 15 minutes.    Status Achieved      SLP SHORT TERM GOAL #2   Title With moderate cues, pt will utilize speech intelligibility strategies to achieve > 90% intelligibility at the sentence level.    Baseline Goal met, revised    Time 10    Period --   sessions   Status Revised      SLP SHORT TERM GOAL #3   Title With minimal cues, pt will utilize compensatory memory strategies to recall therapy sessions for week as well as order of therapy in 8 out of 10 sessions.    Baseline goal met, goal upgraded  Time 10    Period --   sessions   Status Revised      SLP SHORT TERM GOAL #4   Title With moderate assistance, pt will complete basic problem solving tasks with >95% accuracy.    Time 10    Period --   sessions   Status On-going              SLP Long Term Goals - 05/29/21 1538       SLP LONG TERM GOAL #2   Title Pt will independently complete functional problem solving for basic and familiar tasks.    Time 12    Period Weeks    Status On-going    Target Date 08/21/21      SLP LONG TERM GOAL #3   Title Pt will demonstrate selective attention in a moderately distracting environment for 15 minutes with < 1 redirection to task.    Time 12    Period Weeks    Status On-going    Target Date 08/21/21      SLP LONG TERM GOAL #4   Title With minimal cues, pt will increase speech intelligibility to > 75% at  the conversation level.    Baseline goal met, goal updated    Time 12    Period Weeks    Status Revised    Target Date 08/21/21              Plan - 06/03/21 1534     Clinical Impression Statement Pt continues to present with moderate to severe cognitive deficits in the areas of semi-complex problem solving, visuospatial skills, memory, emergent awareness which results in severe impairments in executive function. Overall these cognitive impairments result in decreased speech intelligiblity to ~50% at the conversation level    Speech Therapy Frequency 2x / week    Duration 12 weeks    Treatment/Interventions Cognitive reorganization;Compensatory techniques;Internal/external aids;SLP instruction and feedback;Patient/family education;Functional tasks;Compensatory strategies    Potential to Achieve Goals Good    SLP Home Exercise Plan provided, see instruction section    Consulted and Agree with Plan of Care Patient;Family member/caregiver    Family Member Consulted pt's husband             Patient will benefit from skilled therapeutic intervention in order to improve the following deficits and impairments:   Cognitive communication deficit  Anoxic brain injury [facility])    Problem List Patient Active Problem List   Diagnosis Date Noted   Multiple lung nodules 03/27/2021   Dysarthria and anarthria 02/11/2021   Sinus tachycardia    Labile blood glucose    Dysphagia    Seizure prophylaxis    Malnutrition of moderate degree 01/23/2021   Anoxic brain injury ([place]) 01/10/2021   Prediabetes    Dyslipidemia    S/P percutaneous endoscopic gastrostomy (PEG) tube placement [facility])    Status post tracheostomy ([place])    Aspiration pneumonia of both lower lobes due to gastric secretions ([place]) 01/05/2021   Acute respiratory failure ([facility])    Hypoxic ischemic encephalopathy    Endotracheal tube present    Cardiac arrest ([place]) 12/02/2020   Essential hypertension 12/09/2011   Jerral Mccauley B.  Rutherford Nail M.S., CCC-SLP, [place] Pathologist Rehabilitation Services Office [phone]  Stormy Fabian 06/04/2021, 7:55 AM  [place] MAIN [facility] SERVICES [address] [place], [place], [address] Phone: [phone]   Fax:  [phone]   Name: LILYANA LIPPMAN MRN: [id number] Date of Birth: October 08, 1966

## 2021-06-06 NOTE — Therapy (Addendum)
[place] MAIN [facility] SERVICES [address] [place], [place], [address] Phone: [phone]   Fax:  [phone]  Speech Language Pathology Treatment  Patient Details  Name: [name] [name] MRN: [id number] Date of Birth: [birth year] Referring Provider (SLP): Marlowe Sh[place], [place]   Encounter Date: 06/04/2021   End of Session - 06/06/21 0720     Visit Number 23    Number of Visits 41    Date for SLP Re-Evaluation 08/21/21    Authorization Type Medicaid    Authorization Time Period 05/28/2021 thru 08/21/2021    Authorization - Visit Number 3    Progress Note Due on Visit 10    SLP Start Time 1410    SLP Stop Time  1500    SLP Time Calculation (min) 50 min    Activity Tolerance Patient tolerated treatment well             Past Medical History:  Diagnosis Date   Anoxic brain injury ([place])    Cardiac arrest ([place])    Cognitive communication deficit    Hyperlipidemia    Hypertension    Seizures ([place])    Ventricular fibrillation ([place])     Past Surgical History:  Procedure Laterality Date   PEG PLACEMENT N/A 12/13/2020   Procedure: PERCUTANEOUS ENDOSCOPIC GASTROSTOMY (PEG) PLACEMENT;  Surgeon: Lesly Rubenstein, MD;  Location: [facility] ENDOSCOPY;  Service: Endoscopy;  Laterality: N/A;   TRACHEOSTOMY TUBE PLACEMENT N/A 12/13/2020   Procedure: TRACHEOSTOMY;  Surgeon: Clyde Canterbury, MD;  Location: [facility] ORS;  Service: ENT;  Laterality: N/A;    There were no vitals filed for this visit.   Subjective Assessment - 06/06/21 0706     Subjective tearful - "it is veyr overwhelming" referring to anoxic brain injury and resultant impairments    Patient is accompained by: Family member    Currently in Pain? No/denies                   ADULT SLP TREATMENT - 06/06/21 0001       Treatment Provided   Treatment provided Cognitive-Linquistic      Cognitive-Linquistic Treatment   Treatment focused on Cognition;Patient/family/caregiver  education    Skilled Treatment MONEY MANAGEMENT: moderate faded to minimal cues to achieve 80% accuracy when adding and subtracting multiple digit values: EXECUTIVE FUNCTION: moderate cues to complete basic task, specifically cues for task organizaiton and sequencing              SLP Education - 06/06/21 0719     Education Details progress since initiation of therapy    Person(s) Educated Patient    Methods Explanation;Demonstration;Handout    Comprehension Need further instruction;Verbal cues required              SLP Short Term Goals - 05/29/21 1536       SLP SHORT TERM GOAL #1   Title Pt will independently demonstrate selective attention in mildly distracting environment for 15 minutes.    Status Achieved      SLP SHORT TERM GOAL #2   Title With moderate cues, pt will utilize speech intelligibility strategies to achieve > 90% intelligibility at the sentence level.    Baseline Goal met, revised    Time 10    Period --   sessions   Status Revised      SLP SHORT TERM GOAL #3   Title With minimal cues, pt will utilize compensatory memory strategies to recall therapy sessions for week as well as  order of therapy in 8 out of 10 sessions.    Baseline goal met, goal upgraded    Time 10    Period --   sessions   Status Revised      SLP SHORT TERM GOAL #4   Title With moderate assistance, pt will complete basic problem solving tasks with >95% accuracy.    Time 10    Period --   sessions   Status On-going              SLP Long Term Goals - 05/29/21 1538       SLP LONG TERM GOAL #2   Title Pt will independently complete functional problem solving for basic and familiar tasks.    Time 12    Period Weeks    Status On-going    Target Date 08/21/21      SLP LONG TERM GOAL #3   Title Pt will demonstrate selective attention in a moderately distracting environment for 15 minutes with < 1 redirection to task.    Time 12    Period Weeks    Status On-going    Target  Date 08/21/21      SLP LONG TERM GOAL #4   Title With minimal cues, pt will increase speech intelligibility to > 75% at the conversation level.    Baseline goal met, goal updated    Time 12    Period Weeks    Status Revised    Target Date 08/21/21              Plan - 06/06/21 0141     Clinical Impression Statement Pt continues to present with moderate to severe cognitive communication deficits related to anoxic brain injury. 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 Pt reports that she has not returned to reading her Bible as it is difficult to read (alexia) and remember. Skilled ST intervention is required to target pt's cognitive communication deficits to increase her functional independence within the community, work and leisure activities.     Speech Therapy Frequency 2x / week    Duration 12 weeks    Treatment/Interventions Cognitive reorganization;Compensatory techniques;Internal/external aids;SLP instruction and feedback;Patient/family education;Functional tasks;Compensatory strategies    Potential to Achieve Goals Good    SLP Home Exercise Plan provided, see instruction section    Consulted and Agree with Plan of Care Patient             Patient will benefit from skilled therapeutic intervention in order to improve the following deficits and impairments:   Cognitive communication deficit  Anoxic brain injury [facility])    Problem List Patient Active Problem List   Diagnosis Date Noted   Multiple lung nodules 03/27/2021   Dysarthria and anarthria 02/11/2021   Sinus tachycardia    Labile blood glucose    Dysphagia    Seizure prophylaxis    Malnutrition of moderate degree 01/23/2021   Anoxic brain injury ([place]) 01/10/2021   Prediabetes    Dyslipidemia    S/P percutaneous endoscopic gastrostomy (PEG) tube placement [facility])    Status post tracheostomy ([place])    Aspiration pneumonia of both lower  lobes due to gastric secretions ([place]) 01/05/2021   Acute respiratory failure ([facility])    Hypoxic ischemic encephalopathy    Endotracheal tube present    Cardiac arrest ([place]) 12/02/2020   Essential hypertension 12/09/2011   Eiliana Drone B. Rutherford Nail M.S., [place], [place] Office [phone]  Chen Saadeh 06/06/2021, 3:25 PM  [place] MAIN [facility] SERVICES [address] [place], [place], [address] Phone: [phone]   Fax:  [phone]   Name: RAYNELLE FUJIKAWA MRN: [id number] Date of Birth: 30-Dec-1965

## 2021-06-06 NOTE — Patient Instructions (Signed)
Complete money management activities

## 2021-06-09 ENCOUNTER — Encounter: Payer: Self-pay | Admitting: Occupational Therapy

## 2021-06-09 ENCOUNTER — Encounter: Payer: Self-pay | Admitting: Speech Pathology

## 2021-06-10 ENCOUNTER — Other Ambulatory Visit: Payer: Self-pay

## 2021-06-10 ENCOUNTER — Encounter: Payer: Self-pay | Admitting: Occupational Therapy

## 2021-06-10 ENCOUNTER — Ambulatory Visit: Payer: Self-pay | Admitting: Occupational Therapy

## 2021-06-10 ENCOUNTER — Ambulatory Visit: Payer: Self-pay | Admitting: Speech Pathology

## 2021-06-10 DIAGNOSIS — R278 Other lack of coordination: Secondary | ICD-10-CM

## 2021-06-10 DIAGNOSIS — M6281 Muscle weakness (generalized): Secondary | ICD-10-CM

## 2021-06-10 DIAGNOSIS — G931 Anoxic brain damage, not elsewhere classified: Secondary | ICD-10-CM

## 2021-06-10 DIAGNOSIS — R41841 Cognitive communication deficit: Secondary | ICD-10-CM

## 2021-06-10 NOTE — Therapy (Signed)
[place] MAIN [facility] SERVICES [address] [place], [place], [address] Phone: [phone]   Fax:  [phone]  Occupational Therapy Treatment  Patient Details  Name: [name] [name] MRN: [id number] Date of Birth: 05/25/1966 No data recorded  Encounter Date: 06/10/2021   OT End of Session - 06/10/21 1408     Visit Number 18    Number of Visits 39    Date for OT Re-Evaluation 08/21/21    Authorization Type Progress reports period starting 02/11/2021    OT Start Time 1315    OT Stop Time 1400    OT Time Calculation (min) 45 min    Activity Tolerance Patient tolerated treatment well    Behavior During Therapy [facility] for tasks assessed/performed             Past Medical History:  Diagnosis Date   Anoxic brain injury ([place])    Cardiac arrest ([place])    Cognitive communication deficit    Hyperlipidemia    Hypertension    Seizures ([place])    Ventricular fibrillation ([place])     Past Surgical History:  Procedure Laterality Date   PEG PLACEMENT N/A 12/13/2020   Procedure: PERCUTANEOUS ENDOSCOPIC GASTROSTOMY (PEG) PLACEMENT;  Surgeon: Lesly Rubenstein, MD;  Location: [facility] ENDOSCOPY;  Service: Endoscopy;  Laterality: N/A;   TRACHEOSTOMY TUBE PLACEMENT N/A 12/13/2020   Procedure: TRACHEOSTOMY;  Surgeon: Clyde Canterbury, MD;  Location: [facility] ORS;  Service: ENT;  Laterality: N/A;    There were no vitals filed for this visit.   Subjective Assessment - 06/10/21 1407     Subjective  Pt. reports doing well today.    Patient is accompanied by: Family member    Pertinent History Pt. is a 55 y.o. female who went into cardiac arrest, and was admitted to [facility] on 12/02/2020. Pt. was diagnosed with Anoxic Brain Injury following the Cardiac Arrest. When medically stable, the pt. was tranferred to [place] re[facility]. The patient was discharged home on 02/08/2021, and is ready to begin outpatient OT services. Pt. was independent, and working prior to the  onset. Pt. Has very supportive family. Pt. Enjoys gardening.    Currently in Pain? No/denies            Pt. was administered a portion of the BiVABA. Search strategies for Near space, and Extrapersonal space were assessed. Structured Visual Array; Single letter search-simple: Pt. had 39/40 accurate responses in 2 min. & 48 sec. with one omission on the right using symmetrical horizontal rectilinear visual search strategies. Single letter search-crowded: Pt. completed 40/40 accurate responses in 4 min & 4 sec. using horizontal rectilinear visual search strategies. Word Search: Pt. Completed 30/30 accurate responses completed in 3 min & 31 sec. using symmetrical horizontal rectilinear visual search strategies. Structure complex circles search: Pt. completed 30/30 accurate responses in 2 min., in addition pt. had 7 misidentifications with 5 on the right, and 2 on the left. Pt. chose all the 1/2 circles including the vertical, and horizontal 1/2 circles instead of diagonal only 1/2 circles. Unstructured visual Array; Random Plain Circles simple: Pt. was able to complete 20/20 targets in 43 sec. using symmetrical horizontal left to right visual search strategies. Random Plain Circles-Crowded: Pt. completed 40/40 accurate targets in 1 min. & 52 sec. using symmetrical horizontal left to right visual search strategies. Random complex circles search; Pt. completed 39/40 accurate responses in 3 min. & 49 sec. With one ommission on the far right. Pt. initially utilized symmetrical horizontal  rectilinear visual search strategies, and transitioned to random visual search strategies with no predictable visual search pattern. Pt. was able to only chose the diagonal 1/2 circles instead of the vertical, and horizontal 1/2 circles. Visual search strategies for external space; Scanboard: Pt. utilized a vertical rectilinear visual scan pattern from right to left. Plan to continue to assess visual attention, and visual search  strategies in order to help determine how vision may be affecting ADLs, and IADL functioning, and to provide pt. education about visual compensatory strategies during ADLs, and IADLs .                        OT Education - 06/10/21 1407     Education Details Visual perceptual skills.    Person(s) Educated Patient;Child(ren)    Methods Explanation;Demonstration    Comprehension Verbalized understanding;Returned demonstration                 OT Long Term Goals - 06/02/21 1404       OT LONG TERM GOAL #1   Title Pt. will be able to independently sign her name with 100% legibility.    Baseline Pt. continues to present with limited signature. Eval: Signature not legible, however was able to sign on the line with no deviation above, or below the line.  Recert: Signature with 75% legibility. Spacing words on a line is impaired and requires extensive cueing to scan L to R across the paper.    Time 12    Period Weeks    Status On-going    Target Date 08/21/21      OT LONG TERM GOAL #2   Title Pt. will increase right shoulder AROM to be able to independently retrieve items from closets, and cabinets.    Baseline Pt. has improved with AROM, and is now able to retrieve items from closets, and cabinets. Eval: Pt. presents with limited ROM. Recert: Pt has good AROM overhead but repetitive reaching causes fatigue and need for rest breaks.    Time 12    Period Weeks    Status Partially Met    Target Date 08/21/21      OT LONG TERM GOAL #3   Title Pt. will increase right grip strength by 5# to be able hold a full glass    Baseline Pt. is improving with formulating a composite fist. Eval: limited grip strength. Limited bilateral digit flexion, unable to make a full composite fist.    Time 12    Period Weeks    Status On-going    Target Date 08/21/21      OT LONG TERM GOAL #4   Title Pt. will independently access a computer, and type a one sentence email correspondence.     Baseline Pt. is able to type a one sentence correspondence with multiple mistypes. Eval: Pt. is unable to to perform    Time 12    Period Weeks    Status On-going    Target Date 08/21/21      OT LONG TERM GOAL #5   Title Pt. will improve bilateral [facility] skills to be able to grasp, and manipulate small objects during ADLs.    Baseline Pt. continues to have difficulty grasping, and manipulating small objects. Eval: Pt. presents with limited [facility] skills with difficulty picking,and manipulating small objects.    Time 12    Period Weeks    Status On-going    Target Date 08/21/21      Long  Term Additional Goals   Additional Long Term Goals Yes      OT LONG TERM GOAL #6   Title Pt. will improve UE strength by 2 mm grades to assist with ADLs, and IADLS,    Baseline Pt. continues to present with limited BUE stregth. Pt. is engaging in more ADL,a nd IADL tasks at home. Eval: pt. has difficulty performing.    Time 12    Period Weeks    Status On-going    Target Date 08/21/21      OT LONG TERM GOAL #7   Title Pt. will complete light meal preparation with minA    Baseline Pt. is preparing meals with her daughter's assist. Eval: Pt. is unable to prepare light meal preparation.    Time 12    Period Weeks    Status On-going    Target Date 08/21/21      OT LONG TERM GOAL #8   Title Pt. will perform light home management tasks with minA    Baseline Pt. is engaging in more home management tasks. Eval: pt. is unable to perform    Time 12    Period Weeks    Status On-going    Target Date 08/21/21      OT LONG TERM GOAL  #9   TITLE Pt will demonstrate improved visual/spatial skills as demonstrated by ability to adequately space words in a sentence with min vc.    Baseline Recert: Pt requires max vc. for spacing.    Time 12    Period Weeks    Status New    Target Date 08/21/21                   Plan - 06/10/21 1409     Clinical Impression Statement Pt. was administered a  portion of the BiVABA. Search strategies for Near space, and Extrapersonal space were assessed. Structured Visual Array; Single letter search-simple: Pt. had 39/40 accurate responses in 2 min. & 48 sec. with one omission on the right using symmetrical horizontal rectilinear visual search strategies. Single letter search-crowded: Pt. completed 40/40 accurate responses in 4 min & 4 sec. using horizontal rectilinear visual search strategies. Word Search: Pt. Completed 30/30 accurate responses completed in 3 min & 31 sec. using symmetrical horizontal rectilinear visual search strategies. Structure complex circles search: Pt. completed 30/30 accurate responses in 2 min., in addition pt. had 7 misidentifications with 5 on the right, and 2 on the left. Pt. chose all the 1/2 circles including the vertical, and horizontal 1/2 circles instead of diagonal only 1/2 circles. Unstructured visual Array; Random Plain Circles simple: Pt. was able to complete 20/20 targets in 43 sec. using symmetrical horizontal left to right visual search strategies. Random Plain Circles-Crowded: Pt. completed 40/40 accurate targets in 1 min. & 52 sec. using symmetrical horizontal left to right visual search strategies. Random complex circles search; Pt. completed 39/40 accurate responses in 3 min. & 49 sec. With one ommission on the far right. Pt. initially utilized symmetrical horizontal rectilinear visual search strategies, and transitioned to random visual search strategies with no predictable visual search pattern. Pt. was able to only chose the diagonal 1/2 circles instead of the vertical, and horizontal 1/2 circles. Visual search strategies for external space; Scanboard: Pt. utilized a vertical rectilinear visual scan pattern from right to left. Plan to continue to assess visual attention, and visual search strategies in order to help determine how vision may be affecting ADLs, and IADL functioning, and to  provide pt. education about visual  compensatory strategies during ADLs, and IADLs .      OT Occupational Profile and History Problem Focused Assessment - Including review of records relating to presenting problem    Occupational performance deficits (Please refer to evaluation for details): ADL's;IADL's    Body Structure / Function / Physical Skills ADL;IADL;FMC;ROM;Strength    Rehab Potential Good    Clinical Decision Making Several treatment options, min-mod task modification necessary    Comorbidities Affecting Occupational Performance: May have comorbidities impacting occupational performance    Modification or Assistance to Complete Evaluation  Min-Moderate modification of tasks or assist with assess necessary to complete eval    OT Frequency 2x / week    OT Duration 12 weeks    OT Treatment/Interventions Self-care/ADL training;Neuromuscular education;Therapeutic activities;Patient/family education;DME and/or AE instruction;Visual/perceptual remediation/compensation    Consulted and Agree with Plan of Care Patient             Patient will benefit from skilled therapeutic intervention in order to improve the following deficits and impairments:   Body Structure / Function / Physical Skills: ADL, IADL, FMC, ROM, Strength       Visit Diagnosis: Muscle weakness (generalized)  Other lack of coordination    Problem List Patient Active Problem List   Diagnosis Date Noted   Multiple lung nodules 03/27/2021   Dysarthria and anarthria 02/11/2021   Sinus tachycardia    Labile blood glucose    Dysphagia    Seizure prophylaxis    Malnutrition of moderate degree 01/23/2021   Anoxic brain injury ([place]) 01/10/2021   Prediabetes    Dyslipidemia    S/P percutaneous endoscopic gastrostomy (PEG) tube placement ([place])    Status post tracheostomy ([place])    Aspiration pneumonia of both lower lobes due to gastric secretions ([place]) 01/05/2021   Acute respiratory failure ([place])    Hypoxic ischemic encephalopathy     Endotracheal tube present    Cardiac arrest ([place]) 12/02/2020   Essential hypertension 12/09/2011    Harrel Carina MS, OTR/L 06/10/2021, 2:14 PM  [place] MAIN [facility] SERVICES [address] [place], [place], [address] Phone: [phone]   Fax:  [phone]  Name: [name] [name] MRN: [id number] Date of Birth: 05-11-1966

## 2021-06-13 ENCOUNTER — Ambulatory Visit: Payer: Self-pay | Admitting: Speech Pathology

## 2021-06-13 ENCOUNTER — Ambulatory Visit: Payer: Self-pay

## 2021-06-13 ENCOUNTER — Other Ambulatory Visit: Payer: Self-pay

## 2021-06-13 DIAGNOSIS — G931 Anoxic brain damage, not elsewhere classified: Secondary | ICD-10-CM

## 2021-06-13 DIAGNOSIS — R278 Other lack of coordination: Secondary | ICD-10-CM

## 2021-06-13 DIAGNOSIS — M6281 Muscle weakness (generalized): Secondary | ICD-10-CM

## 2021-06-13 DIAGNOSIS — R41841 Cognitive communication deficit: Secondary | ICD-10-CM

## 2021-06-13 NOTE — Therapy (Addendum)
[place] MAIN [facility] SERVICES [address] [place], [place], [address] Phone: [phone]   Fax:  [phone]  Speech Language Pathology Treatment  Patient Details  Name: [name] [name] MRN: [id number] Date of Birth: [birth year] Referring Provider (SLP): Mar[place]e [place], [place]   Encounter Date: 06/10/2021   End of Session - 06/13/21 0901     Visit Number 24    Number of Visits 44    Date for SLP Re-Evaluation 08/21/21    Authorization Type Medicaid    Authorization Time Period 05/28/2021 thru 08/21/2021    Authorization - Visit Number 4    Progress Note Due on Visit 10    SLP Start Time 1400    SLP Stop Time  1500    SLP Time Calculation (min) 60 min    Activity Tolerance Patient tolerated treatment well             Past Medical History:  Diagnosis Date   Anoxic brain injury ([place])    Cardiac arrest ([place])    Cognitive communication deficit    Hyperlipidemia    Hypertension    Seizures ([place])    Ventricular fibrillation ([place])     Past Surgical History:  Procedure Laterality Date   PEG PLACEMENT N/A 12/13/2020   Procedure: PERCUTANEOUS ENDOSCOPIC GASTROSTOMY (PEG) PLACEMENT;  Surgeon: Lesly Rubenstein, MD;  Location: [facility] ENDOSCOPY;  Service: Endoscopy;  Laterality: N/A;   TRACHEOSTOMY TUBE PLACEMENT N/A 12/13/2020   Procedure: TRACHEOSTOMY;  Surgeon: Clyde Canterbury, MD;  Location: [facility] ORS;  Service: ENT;  Laterality: N/A;    There were no vitals filed for this visit.   Subjective Assessment - 06/13/21 0817     Subjective pt pleasant    Patient is accompained by: Family member    Currently in Pain? No/denies                   ADULT SLP TREATMENT - 06/13/21 0001       Treatment Provided   Treatment provided Cognitive-Linquistic      Cognitive-Linquistic Treatment   Treatment focused on Cognition    Skilled Treatment PROBLEM SOLVING: moderate to maximal cues to complete basic problem solving task, cues  specficically for impulsivity,task organization,              SLP Education - 06/13/21 0901     Education Details provided    Person(s) Educated Patient;Spouse    Methods Explanation;Demonstration;Handout;Verbal cues    Comprehension Verbalized understanding;Need further instruction              SLP Short Term Goals - 05/29/21 1536       SLP SHORT TERM GOAL #1   Title Pt will independently demonstrate selective attention in mildly distracting environment for 15 minutes.    Status Achieved      SLP SHORT TERM GOAL #2   Title With moderate cues, pt will utilize speech intelligibility strategies to achieve > 90% intelligibility at the sentence level.    Baseline Goal met, revised    Time 10    Period --   sessions   Status Revised      SLP SHORT TERM GOAL #3   Title With minimal cues, pt will utilize compensatory memory strategies to recall therapy sessions for week as well as order of therapy in 8 out of 10 sessions.    Baseline goal met, goal upgraded    Time 10    Period --   sessions   Status  Revised      SLP SHORT TERM GOAL #4   Title With moderate assistance, pt will complete basic problem solving tasks with >95% accuracy.    Time 10    Period --   sessions   Status On-going              SLP Long Term Goals - 05/29/21 1538       SLP LONG TERM GOAL #2   Title Pt will independently complete functional problem solving for basic and familiar tasks.    Time 12    Period Weeks    Status On-going    Target Date 08/21/21      SLP LONG TERM GOAL #3   Title Pt will demonstrate selective attention in a moderately distracting environment for 15 minutes with < 1 redirection to task.    Time 12    Period Weeks    Status On-going    Target Date 08/21/21      SLP LONG TERM GOAL #4   Title With minimal cues, pt will increase speech intelligibility to > 75% at the conversation level.    Baseline goal met, goal updated    Time 12    Period Weeks    Status  Revised    Target Date 08/21/21              Plan - 06/13/21 0902     Clinical Impression Statement Pt continues to present with moderate to severe cognitive communication deficits related to anoxic brain injury. 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 Pt reports that she has not returned to reading her Bible as it is difficult to read (alexia) and remember. Skilled ST intervention is required to target pt's cognitive communication deficits to increase her functional independence within the community, work and leisure activities.    Speech Therapy Frequency 2x / week    Duration 12 weeks    Treatment/Interventions Cognitive reorganization;Compensatory techniques;Internal/external aids;SLP instruction and feedback;Patient/family education;Functional tasks;Compensatory strategies    Potential to Achieve Goals Good    Potential Considerations Severity of impairments;Ability to learn/carryover information    SLP Home Exercise Plan provided, see instruction section    Consulted and Agree with Plan of Care Patient;Family member/caregiver    Family Member Consulted pt's husband             Patient will benefit from skilled therapeutic intervention in order to improve the following deficits and impairments:   Cognitive communication deficit  Anoxic brain injury [facility])    Problem List Patient Active Problem List   Diagnosis Date Noted   Multiple lung nodules 03/27/2021   Dysarthria and anarthria 02/11/2021   Sinus tachycardia    Labile blood glucose    Dysphagia    Seizure prophylaxis    Malnutrition of moderate degree 01/23/2021   Anoxic brain injury ([place]) 01/10/2021   Prediabetes    Dyslipidemia    S/P percutaneous endoscopic gastrostomy (PEG) tube placement [facility])    Status post tracheostomy ([place])    Aspiration pneumonia of both lower lobes due to gastric secretions ([place]) 01/05/2021   Acute  respiratory failure ([facility])    Hypoxic ischemic encephalopathy    Endotracheal tube present    Cardiac arrest ([place]) 12/02/2020   Essential hypertension 12/09/2011   Curvin Hunger B. Rutherford Nail M.S., CCC-SLP, [place] Office [phone]  Stormy Fabian 06/13/2021, 9:03 AM  [place] [facility]IN REHAB  SERVICES [place], [place], [address] Phone: [phone]   Fax:  [phone]   Name: [name] [name] MRN: [id number] Date of Birth: May 08, 1966

## 2021-06-13 NOTE — Therapy (Signed)
[place] MAIN [facility] SERVICES [address] [place], [place], [address] Phone: [phone]   Fax:  [phone]  Occupational Therapy Treatment  Patient Details  Name: [name] [name] MRN: [id number] Date of Birth: Jun 22, 1966 No data recorded  Encounter Date: 06/13/2021   OT End of Session - 06/13/21 1138     Visit Number 19    Number of Visits 39    Date for OT Re-Evaluation 08/21/21    Authorization Type Progress reports period starting 02/11/2021    OT Start Time 1021    OT Stop Time 1101    OT Time Calculation (min) 40 min    Activity Tolerance Patient tolerated treatment well    Behavior During Therapy [facility] for tasks assessed/performed             Past Medical History:  Diagnosis Date   Anoxic brain injury ([place])    Cardiac arrest ([place])    Cognitive communication deficit    Hyperlipidemia    Hypertension    Seizures ([place])    Ventricular fibrillation ([place])     Past Surgical History:  Procedure Laterality Date   PEG PLACEMENT N/A 12/13/2020   Procedure: PERCUTANEOUS ENDOSCOPIC GASTROSTOMY (PEG) PLACEMENT;  Surgeon: Lesly Rubenstein, MD;  Location: [facility] ENDOSCOPY;  Service: Endoscopy;  Laterality: N/A;   TRACHEOSTOMY TUBE PLACEMENT N/A 12/13/2020   Procedure: TRACHEOSTOMY;  Surgeon: Clyde Canterbury, MD;  Location: [facility] ORS;  Service: ENT;  Laterality: N/A;    There were no vitals filed for this visit.   Subjective Assessment - 06/13/21 1137     Subjective  "I'm having a good morning so far."    Patient is accompanied by: Family member    Pertinent History Pt. is a 55 y.o. female who went into cardiac arrest, and was admitted to [facility] on 12/02/2020. Pt. was diagnosed with Anoxic Brain Injury following the Cardiac Arrest. When medically stable, the pt. was tranferred to [facility] rehab. The patient was discharged home on 02/08/2021, and is ready to begin outpatient OT services. Pt. was independent, and working prior to  the onset. Pt. Has very supportive family. Pt. Enjoys gardening.    Currently in Pain? No/denies    Multiple Pain Sites No            Occupational Therapy Treatment: Therapeutic Activities: Pt participated in visual/perceptual retraining activities, with focus on visual spatial and form constancy activities.  Pt was able to correctly match 3/3 simple shapes when given 4 choices, and correctly match 3/3 moderately simple shapes with extra time when given a choice of 5 shapes.  Pt copied figures drawn on a 4x3 dotted grid, and a 4x4 dotted grid with moderately complex designs, requiring extra time and mod A for accuracy.  Participated in graded clock drawing activity, first tracing all 12 numbers on the clock, then completing the numbers which were partially written, then filling in clock which had missing numbers, then filling in all 12 numbers with lines drawn for correct spacing of each number, and blank clock.  Pt with good accuracy to trace numbers independently, required min vc to finish 3 out of 12 numbers that were 1/2 written, was able to fill in missing numbers without cues, then required min vc for correct spacing on a blank clock.     Response to Treatment: Overall pt making steady progress with improving visual/perceptual skills.  Pt requires moderate vc to utilize organized scanning patterns and sequencing cues to avoid missed lines  when copying figures on a dotted grid.  Pt continues to show visual spatial and form constancy deficits which impair hand writing, reading, and processing visual information within her environment.  Pt will continue to benefit from skilled OT to improve these skills in order to maximize indep with ADLs/IADLs.      OT Education - 06/13/21 1138     Education Details HEP progression to include visual perceptual activities at home    Person(s) Educated Patient;Child(ren)    Methods Explanation;Demonstration    Comprehension Verbalized understanding;Returned  demonstration                 OT Long Term Goals - 06/02/21 1404       OT LONG TERM GOAL #1   Title Pt. will be able to independently sign her name with 100% legibility.    Baseline Pt. continues to present with limited signature. Eval: Signature not legible, however was able to sign on the line with no deviation above, or below the line.  Recert: Signature with 75% legibility. Spacing words on a line is impaired and requires extensive cueing to scan L to R across the paper.    Time 12    Period Weeks    Status On-going    Target Date 08/21/21      OT LONG TERM GOAL #2   Title Pt. will increase right shoulder AROM to be able to independently retrieve items from closets, and cabinets.    Baseline Pt. has improved with AROM, and is now able to retrieve items from closets, and cabinets. Eval: Pt. presents with limited ROM. Recert: Pt has good AROM overhead but repetitive reaching causes fatigue and need for rest breaks.    Time 12    Period Weeks    Status Partially Met    Target Date 08/21/21      OT LONG TERM GOAL #3   Title Pt. will increase right grip strength by 5# to be able hold a full glass    Baseline Pt. is improving with formulating a composite fist. Eval: limited grip strength. Limited bilateral digit flexion, unable to make a full composite fist.    Time 12    Period Weeks    Status On-going    Target Date 08/21/21      OT LONG TERM GOAL #4   Title Pt. will independently access a computer, and type a one sentence email correspondence.    Baseline Pt. is able to type a one sentence correspondence with multiple mistypes. Eval: Pt. is unable to to perform    Time 12    Period Weeks    Status On-going    Target Date 08/21/21      OT LONG TERM GOAL #5   Title Pt. will improve bilateral [facility] skills to be able to grasp, and manipulate small objects during ADLs.    Baseline Pt. continues to have difficulty grasping, and manipulating small objects. Eval: Pt. presents  with limited [facility] skills with difficulty picking,and manipulating small objects.    Time 12    Period Weeks    Status On-going    Target Date 08/21/21      Long Term Additional Goals   Additional Long Term Goals Yes      OT LONG TERM GOAL #6   Title Pt. will improve UE strength by 2 mm grades to assist with ADLs, and IADLS,    Baseline Pt. continues to present with limited BUE stregth. Pt. is engaging in more  ADL,a nd IADL tasks at home. Eval: pt. has difficulty performing.    Time 12    Period Weeks    Status On-going    Target Date 08/21/21      OT LONG TERM GOAL #7   Title Pt. will complete light meal preparation with minA    Baseline Pt. is preparing meals with her daughter's assist. Eval: Pt. is unable to prepare light meal preparation.    Time 12    Period Weeks    Status On-going    Target Date 08/21/21      OT LONG TERM GOAL #8   Title Pt. will perform light home management tasks with minA    Baseline Pt. is engaging in more home management tasks. Eval: pt. is unable to perform    Time 12    Period Weeks    Status On-going    Target Date 08/21/21      OT LONG TERM GOAL  #9   TITLE Pt will demonstrate improved visual/spatial skills as demonstrated by ability to adequately space words in a sentence with min vc.    Baseline Recert: Pt requires max vc. for spacing.    Time 12    Period Weeks    Status New    Target Date 08/21/21                   Plan - 06/13/21 1155     Clinical Impression Statement Overall pt making steady progress with improving visual/perceptual skills.  Pt requires moderate vc to utilize organized scanning patterns and sequencing cues to avoid missed lines when copying figures on a dotted grid.  Pt continues to show visual spatial and form constancy deficits which impair hand writing, reading, and processing visual information within her environment.  Pt will continue to benefit from skilled OT to improve these skills in order to maximize  indep with ADLs/IADLs.    OT Occupational Profile and History Problem Focused Assessment - Including review of records relating to presenting problem    Occupational performance deficits (Please refer to evaluation for details): ADL's;IADL's    Body Structure / Function / Physical Skills ADL;IADL;FMC;ROM;Strength    Rehab Potential Good    Clinical Decision Making Several treatment options, min-mod task modification necessary    Comorbidities Affecting Occupational Performance: May have comorbidities impacting occupational performance    Modification or Assistance to Complete Evaluation  Min-Moderate modification of tasks or assist with assess necessary to complete eval    OT Frequency 2x / week    OT Duration 12 weeks    OT Treatment/Interventions Self-care/ADL training;Neuromuscular education;Therapeutic activities;Patient/family education;DME and/or AE instruction;Visual/perceptual remediation/compensation    Consulted and Agree with Plan of Care Patient             Patient will benefit from skilled therapeutic intervention in order to improve the following deficits and impairments:   Body Structure / Function / Physical Skills: ADL, IADL, FMC, ROM, Strength       Visit Diagnosis: Anoxic brain injury ([place])  Muscle weakness (generalized)  Other lack of coordination    Problem List Patient Active Problem List   Diagnosis Date Noted   Multiple lung nodules 03/27/2021   Dysarthria and anarthria 02/11/2021   Sinus tachycardia    Labile blood glucose    Dysphagia    Seizure prophylaxis    Malnutrition of moderate degree 01/23/2021   Anoxic brain injury ([place]) 01/10/2021   Prediabetes    Dyslipidemia    S/P  percutaneous endoscopic gastrostomy (PEG) tube placement [facility])    Status post tracheostomy ([place])    Aspiration pneumonia of both lower lobes due to gastric secretions ([place]) 01/05/2021   Acute respiratory failure ([place])    Hypoxic ischemic encephalopathy     Endotracheal tube present    Cardiac arrest ([place]) 12/02/2020   Essential hypertension 12/09/2011   Leta Speller, MS, OTR/L  Darleene Cleaver 06/13/2021, 11:56 AM  [place] MAIN [facility] SERVICES [address] [place], [place], [address] Phone: [phone]   Fax:  [phone]  Name: [name] [name] MRN: [id number] Date of Birth: [birth year]

## 2021-06-13 NOTE — Patient Instructions (Signed)
Complete word search

## 2021-06-14 NOTE — Patient Instructions (Signed)
Complete word searches

## 2021-06-14 NOTE — Therapy (Signed)
[place] MAIN [facility] SERVICES [address] [place], [place], [address] Phone: [phone]   Fax:  [phone]  Speech Language Pathology Treatment  Patient Details  Name: [name] [name] MRN: [id number] Date of Birth: [birth year] Referring Provider (SLP): [place]e [place], [place]   Encounter Date: 06/13/2021   End of Session - 06/14/21 2153     Visit Number 25    Number of Visits 44    Date for SLP Re-Evaluation 08/21/21    Authorization Type Self Pay    Authorization Time Period 05/28/2021 thru 08/21/2021    Authorization - Visit Number 5    Progress Note Due on Visit 10    SLP Start Time 1100    SLP Stop Time  1200    SLP Time Calculation (min) 60 min    Activity Tolerance Patient tolerated treatment well             Past Medical History:  Diagnosis Date   Anoxic brain injury ([place])    Cardiac arrest ([place])    Cognitive communication deficit    Hyperlipidemia    Hypertension    Seizures ([place])    Ventricular fibrillation ([place])     Past Surgical History:  Procedure Laterality Date   PEG PLACEMENT N/A 12/13/2020   Procedure: PERCUTANEOUS ENDOSCOPIC GASTROSTOMY (PEG) PLACEMENT;  Surgeon: Lesly Rubenstein, MD;  Location: [facility] ENDOSCOPY;  Service: Endoscopy;  Laterality: N/A;   TRACHEOSTOMY TUBE PLACEMENT N/A 12/13/2020   Procedure: TRACHEOSTOMY;  Surgeon: Clyde Canterbury, MD;  Location: [facility] ORS;  Service: ENT;  Laterality: N/A;    There were no vitals filed for this visit.   Subjective Assessment - 06/14/21 2151     Subjective pt pleasant    Currently in Pain? No/denies                   ADULT SLP TREATMENT - 06/14/21 0001       Treatment Provided   Treatment provided Cognitive-Linquistic      Cognitive-Linquistic Treatment   Treatment focused on Cognition    Skilled Treatment PROBLEM SOLVING: moderate cues to complete basic problem solving task, cues specficically for impulsivity,task organization,               SLP Education - 06/14/21 2153     Education Details provided    Person(s) Educated Patient    Methods Explanation    Comprehension Verbalized understanding;Need further instruction              SLP Short Term Goals - 05/29/21 1536       SLP SHORT TERM GOAL #1   Title Pt will independently demonstrate selective attention in mildly distracting environment for 15 minutes.    Status Achieved      SLP SHORT TERM GOAL #2   Title With moderate cues, pt will utilize speech intelligibility strategies to achieve > 90% intelligibility at the sentence level.    Baseline Goal met, revised    Time 10    Period --   sessions   Status Revised      SLP SHORT TERM GOAL #3   Title With minimal cues, pt will utilize compensatory memory strategies to recall therapy sessions for week as well as order of therapy in 8 out of 10 sessions.    Baseline goal met, goal upgraded    Time 10    Period --   sessions   Status Revised      SLP SHORT TERM GOAL #4  Title With moderate assistance, pt will complete basic problem solving tasks with >95% accuracy.    Time 10    Period --   sessions   Status On-going              SLP Long Term Goals - 05/29/21 1538       SLP LONG TERM GOAL #2   Title Pt will independently complete functional problem solving for basic and familiar tasks.    Time 12    Period Weeks    Status On-going    Target Date 08/21/21      SLP LONG TERM GOAL #3   Title Pt will demonstrate selective attention in a moderately distracting environment for 15 minutes with < 1 redirection to task.    Time 12    Period Weeks    Status On-going    Target Date 08/21/21      SLP LONG TERM GOAL #4   Title With minimal cues, pt will increase speech intelligibility to > 75% at the conversation level.    Baseline goal met, goal updated    Time 12    Period Weeks    Status Revised    Target Date 08/21/21              Plan - 06/14/21 2153     Clinical Impression  Statement Pt continues to present with moderate to severe cognitive communication deficits related to anoxic brain injury. 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 Skilled ST intervention is required to target pt's cognitive communication deficits to increase her functional independence within the community, work and leisure activities.    Speech Therapy Frequency 2x / week    Duration 12 weeks    Treatment/Interventions Cognitive reorganization;Compensatory techniques;Internal/external aids;SLP instruction and feedback;Patient/family education;Functional tasks;Compensatory strategies    Potential to Achieve Goals Good    Potential Considerations Severity of impairments;Ability to learn/carryover information    SLP Home Exercise Plan provided, see instruction section    Consulted and Agree with Plan of Care Patient             Patient will benefit from skilled therapeutic intervention in order to improve the following deficits and impairments:   Cognitive communication deficit  Anoxic brain injury [facility])    Problem List Patient Active Problem List   Diagnosis Date Noted   Multiple lung nodules 03/27/2021   Dysarthria and anarthria 02/11/2021   Sinus tachycardia    Labile blood glucose    Dysphagia    Seizure prophylaxis    Malnutrition of moderate degree 01/23/2021   Anoxic brain injury ([place]) 01/10/2021   Prediabetes    Dyslipidemia    S/P percutaneous endoscopic gastrostomy (PEG) tube placement [facility])    Status post tracheostomy ([place])    Aspiration pneumonia of both lower lobes due to gastric secretions ([place]) 01/05/2021   Acute respiratory failure ([facility])    Hypoxic ischemic encephalopathy    Endotracheal tube present    Cardiac arrest ([place]) 12/02/2020   Essential hypertension 12/09/2011   Lenus Trauger B. Rutherford Nail M.S., CCC-SLP, [place] Pathologist [facility] Services Office  [phone]  Stormy Fabian 06/14/2021, 9:55 PM  [place] MAIN [facility] SERVICES [address] [place], [place], [address] Phone: [phone]   Fax:  [phone]   Name: [name] [name] MRN: [id number] Date of Birth: 1965/12/27

## 2021-06-16 ENCOUNTER — Encounter: Payer: Self-pay | Admitting: Occupational Therapy

## 2021-06-16 ENCOUNTER — Ambulatory Visit: Payer: Self-pay | Admitting: Occupational Therapy

## 2021-06-16 ENCOUNTER — Other Ambulatory Visit: Payer: Self-pay

## 2021-06-16 ENCOUNTER — Ambulatory Visit: Payer: Self-pay | Admitting: Speech Pathology

## 2021-06-16 DIAGNOSIS — R278 Other lack of coordination: Secondary | ICD-10-CM

## 2021-06-16 DIAGNOSIS — G931 Anoxic brain damage, not elsewhere classified: Secondary | ICD-10-CM

## 2021-06-16 DIAGNOSIS — R41841 Cognitive communication deficit: Secondary | ICD-10-CM

## 2021-06-16 DIAGNOSIS — M6281 Muscle weakness (generalized): Secondary | ICD-10-CM

## 2021-06-16 NOTE — Therapy (Signed)
[place] MAIN [facility] SERVICES [address] [place], [place], [address] Phone: [phone]   Fax:  [phone]  Occupational Therapy Progress Note                                                              Dates of reporting period  02/11/2021   to   06/16/2021   Patient Details  Name: [name] [name] MRN: [id number] Date of Birth: [birth year] No data recorded  Encounter Date: 06/16/2021   OT End of Session - 06/16/21 1432     Visit Number 20    Number of Visits 39    Date for OT Re-Evaluation 08/21/21    Authorization Type Progress reports period starting 02/11/2021    OT Start Time 1430    OT Stop Time 1515    OT Time Calculation (min) 45 min    Activity Tolerance Patient tolerated treatment well    Behavior During Therapy [facility] for tasks assessed/performed             Past Medical History:  Diagnosis Date   Anoxic brain injury ([place])    Cardiac arrest ([place])    Cognitive communication deficit    Hyperlipidemia    Hypertension    Seizures ([place])    Ventricular fibrillation ([place])     Past Surgical History:  Procedure Laterality Date   PEG PLACEMENT N/A 12/13/2020   Procedure: PERCUTANEOUS ENDOSCOPIC GASTROSTOMY (PEG) PLACEMENT;  Surgeon: Lesly Rubenstein, MD;  Location: [facility] ENDOSCOPY;  Service: Endoscopy;  Laterality: N/A;   TRACHEOSTOMY TUBE PLACEMENT N/A 12/13/2020   Procedure: TRACHEOSTOMY;  Surgeon: Clyde Canterbury, MD;  Location: [facility] ORS;  Service: ENT;  Laterality: N/A;    There were no vitals filed for this visit.   Subjective Assessment - 06/16/21 1431     Subjective  Pt. reports doing well this afternoon.    Patient is accompanied by: Family member    Pertinent History Pt. is a 55 y.o. female who went into cardiac arrest, and was admitted to [facility] on 12/02/2020. Pt. was diagnosed with Anoxic Brain Injury following the Cardiac Arrest. When medically stable, the pt. was tranferred to [facility] rehab. The  patient was discharged home on 02/08/2021, and is ready to begin outpatient OT services. Pt. was independent, and working prior to the onset. Pt. Has very supportive family. Pt. Enjoys gardening.    Currently in Pain? No/denies                [facility] OT Assessment - 06/16/21 1440       Coordination   Right 9 Hole Peg Test 29 sec.    Left 9 Hole Peg Test 32 sec.      Hand Function   Right Hand Grip (lbs) 29    Right Hand Lateral Pinch 9 lbs    Right Hand 3 Point Pinch 11 lbs    Left Hand Grip (lbs) 29    Left Hand Lateral Pinch 11 lbs    Left 3 point pinch 11 lbs             Pt. has made excellent progress with bilateral UE stregnth, pinch strength, and [place] skills. Pt. Is now able to consistently complete home management tasks including: laundry, dishes, bedmaking, dusting,  sweeping, and vacuuming. Pt. is now cooking more, and assisting her daughter  with meals. Pt. Continues to work on improving UE strength, coordination, typing skills in preparation for being able to send email correspondence, and writing legibility, as well as visual compensatory strategies during AD, and IADL tasks.                 OT Education - 06/16/21 1432     Education Details Goals, POC    Person(s) Educated Patient;Child(ren)    Methods Explanation;Demonstration    Comprehension Verbalized understanding;Returned demonstration                 OT Long Term Goals - 06/16/21 1434       OT LONG TERM GOAL #1   Title Pt. will be able to independently sign her name with 100% legibility.    Baseline Pt. continues to present with limited signature. Eval: Signature not legible, however was able to sign on the line with no deviation above, or below the line.  Recert: Signature with 75% legibility. Spacing words on a line is impaired and requires extensive cueing to scan L to R across the paper. 20th visit: 75% legible printed, 50% signature.    Time 12    Period Weeks    Status  On-going    Target Date 08/21/21      OT LONG TERM GOAL #2   Title Pt. will increase right shoulder AROM to be able to independently retrieve items from closets, and cabinets.    Baseline Pt. has improved with AROM, and is now able to retrieve items from closets, and cabinets. Eval: Pt. presents with limited ROM. Recert: Pt has good AROM overhead but repetitive reaching causes fatigue and need for rest breaks. 20th visit: pt. has progressed with AROM, and functional reaching.    Time 12    Period Weeks    Status Achieved      OT LONG TERM GOAL #3   Title Pt. will increase right grip strength by 5# to be able hold a full glass    Baseline Pt. is improving with formulating a composite fist. Eval: limited grip strength. Limited bilateral digit flexion, unable to make a full composite fist. 20th visit: Pt. is making excellent progress with bilateral grip strengt. Pt. is able to hold, a d drink from a full glass.    Time 12    Period Weeks    Status On-going    Target Date 08/21/21      OT LONG TERM GOAL #4   Title Pt. will independently access a computer, and type a one sentence email correspondence.    Baseline 20th visit: Pt. continues to have difficulty tying a sentence leggibly. Pt. is able to type a one sentence correspondence with multiple mistypes. Eval: Pt. is unable to to perform    Time 12    Period Weeks    Status On-going    Target Date 08/21/21      OT LONG TERM GOAL #5   Title Pt. will improve bilateral [name] skills to be able to grasp, and manipulate small objects during ADLs.    Baseline 20th visit: Pt. has made excellent progress with bialteral [facility] skills, and grasping small objects. Pt. continues to have difficulty grasping, and manipulating small objects. Eval: Pt. presents with limited [facility] skills with difficulty picking,and manipulating small objects.    Time 12    Period Weeks    Status On-going    Target Date 08/21/21  OT LONG TERM GOAL #6   Title Pt. will  improve UE strength by 2 mm grades to assist with ADLs, and IADLS,    Baseline [id number] visit: BUE strength; 4+/5 overall. Pt. continues to present with limited BUE stregth. Pt. is engaging in more ADL,a nd IADL tasks at home. Eval: pt. has difficulty performing.    Time 12    Period Weeks    Status On-going    Target Date 08/21/21      OT LONG TERM GOAL #7   Title Pt. will complete light meal preparation with minA    Baseline 06/16/2021: Pt. is assisting her daughter with cooking chiscken, bacon, slamon, and grits. Pt. is preparing meals with her daughter's assist. Eval: Pt. is unable to prepare light meal preparation.    Time 12    Period Weeks    Target Date 08/21/21      OT LONG TERM GOAL #8   Title Pt. will perform light home management tasks with minA    Baseline 06/16/2021: Pt. is independent with bedmaking, vacuuming, dishes, dusting, sweeping the porch. Pt. is independent with her, and har husband's laundry. Pt.'s daughter helps her down the outdoor steps [place] of the stones. Pt. is engaging in more home management tasks. Eval: pt. is unable to perform    Time 12    Status On-going    Target Date 08/21/21      OT LONG TERM GOAL  #9   TITLE Pt will demonstrate improved visual/spatial skills as demonstrated by ability to adequately space words in a sentence with min vc.    Baseline 20th visit: Pt. requires cues, and assist for spacing when writing sentences.    Time 12    Period Weeks    Status On-going    Target Date 08/21/21                   Plan - 06/16/21 1433     Clinical Impression Statement Pt. has made excellent progress with bilateral UE stregnth, pinch strength, and [place] skills. Pt. Is now able to consistently complete home management tasks including: laundry, dishes, bedmaking, dusting, sweeping, and vacuuming. Pt. is now cooking more, and assisting her daughter  with meals. Pt. Continues to work on improving UE strength, coordination, typing skills in  preparation for being able to send email correspondence, and writing legibility, as well as visual compensatory strategies during AD, and IADL tasks.   OT Occupational Profile and History Problem Focused Assessment - Including review of records relating to presenting problem    Occupational performance deficits (Please refer to evaluation for details): ADL's;IADL's    Body Structure / Function / Physical Skills ADL;IADL;FMC;ROM;Strength    Rehab Potential Good    Clinical Decision Making Several treatment options, min-mod task modification necessary    Comorbidities Affecting Occupational Performance: May have comorbidities impacting occupational performance    Modification or Assistance to Complete Evaluation  Min-Moderate modification of tasks or assist with assess necessary to complete eval    OT Frequency 2x / week    OT Duration 12 weeks    OT Treatment/Interventions Self-care/ADL training;Neuromuscular education;Therapeutic activities;Patient/family education;DME and/or AE instruction;Visual/perceptual remediation/compensation    Consulted and Agree with Plan of Care Patient             Patient will benefit from skilled therapeutic intervention in order to improve the following deficits and impairments:   Body Structure / Function / Physical Skills: ADL, IADL, FMC, ROM, Strength  Visit Diagnosis: Muscle weakness (generalized)  Other lack of coordination    Problem List Patient Active Problem List   Diagnosis Date Noted   Multiple lung nodules 03/27/2021   Dysarthria and anarthria 02/11/2021   Sinus tachycardia    Labile blood glucose    Dysphagia    Seizure prophylaxis    Malnutrition of moderate degree 01/23/2021   Anoxic brain injury ([place]) 01/10/2021   Prediabetes    Dyslipidemia    S/P percutaneous endoscopic gastrostomy (PEG) tube placement [facility])    Status post tracheostomy ([place])    Aspiration pneumonia of both lower lobes due to gastric secretions ([place])  01/05/2021   Acute respiratory failure (HCC)    Hypoxic ischemic encephalopathy    Endotracheal tube present    Cardiac arrest ([place]) 12/02/2020   Essential hypertension 12/09/2011    Harrel Carina, MS, OTR/L 06/16/2021, 3:06 PM  [place] MAIN [facility] SERVICES [address] [place], [place], [address] Phone: [phone]   Fax:  [phone]  Name: AIMME STARZYNSKI MRN: [id number] Date of Birth: 01-21-1966

## 2021-06-17 NOTE — Therapy (Signed)
[place] MAIN [facility] SERVICES [address] [place], [place], [address] Phone: [phone]   Fax:  [phone]  Speech Language Pathology Treatment  Patient Details  Name: [name] [name] MRN: [id number] Date of Birth: 08-Jul-1966 Referring Provider (SLP): Marlowe [place], [place]   Encounter Date: 06/16/2021   End of Session - 06/17/21 0851     Visit Number 26    Number of Visits 37    Date for SLP Re-Evaluation 08/21/21    Authorization Type Self Pay    Authorization Time Period 05/28/2021 thru 08/21/2021    Authorization - Visit Number 6    Progress Note Due on Visit 10    SLP Start Time 1515    SLP Stop Time  1600    SLP Time Calculation (min) 45 min    Activity Tolerance Patient tolerated treatment well             Past Medical History:  Diagnosis Date   Anoxic brain injury ([place])    Cardiac arrest ([place])    Cognitive communication deficit    Hyperlipidemia    Hypertension    Seizures ([place])    Ventricular fibrillation ([place])     Past Surgical History:  Procedure Laterality Date   PEG PLACEMENT N/A 12/13/2020   Procedure: PERCUTANEOUS ENDOSCOPIC GASTROSTOMY (PEG) PLACEMENT;  Surgeon: Lesly Rubenstein, MD;  Location: [facility] ENDOSCOPY;  Service: Endoscopy;  Laterality: N/A;   TRACHEOSTOMY TUBE PLACEMENT N/A 12/13/2020   Procedure: TRACHEOSTOMY;  Surgeon: Clyde Canterbury, MD;  Location: [facility] ORS;  Service: ENT;  Laterality: N/A;    There were no vitals filed for this visit.   Subjective Assessment - 06/17/21 0850     Subjective pt pleasant, accompanied by her husband    Patient is accompained by: Family member    Currently in Pain? No/denies                   ADULT SLP TREATMENT - 06/17/21 0001       Treatment Provided   Treatment provided Cognitive-Linquistic      Cognitive-Linquistic Treatment   Treatment focused on Cognition;Patient/family/caregiver education    Skilled Treatment COGNITION: Maximal faded to  moderate cues for sequencing words in a rote sentence - "Good afternoon [name], Can I get a $150 comcheck? Thank you" - written example provided with words gradually faded out, pt experienced improved accuracy with this model of assistance.              SLP Education - 06/17/21 0850     Education Details provided    Person(s) Educated Patient    Methods Explanation    Comprehension Verbalized understanding;Need further instruction              SLP Short Term Goals - 05/29/21 1536       SLP SHORT TERM GOAL #1   Title Pt will independently demonstrate selective attention in mildly distracting environment for 15 minutes.    Status Achieved      SLP SHORT TERM GOAL #2   Title With moderate cues, pt will utilize speech intelligibility strategies to achieve > 90% intelligibility at the sentence level.    Baseline Goal met, revised    Time 10    Period --   sessions   Status Revised      SLP SHORT TERM GOAL #3   Title With minimal cues, pt will utilize compensatory memory strategies to recall therapy sessions for week as well as order of  therapy in 8 out of 10 sessions.    Baseline goal met, goal upgraded    Time 10    Period --   sessions   Status Revised      SLP SHORT TERM GOAL #4   Title With moderate assistance, pt will complete basic problem solving tasks with >95% accuracy.    Time 10    Period --   sessions   Status On-going              SLP Long Term Goals - 05/29/21 1538       SLP LONG TERM GOAL #2   Title Pt will independently complete functional problem solving for basic and familiar tasks.    Time 12    Period Weeks    Status On-going    Target Date 08/21/21      SLP LONG TERM GOAL #3   Title Pt will demonstrate selective attention in a moderately distracting environment for 15 minutes with < 1 redirection to task.    Time 12    Period Weeks    Status On-going    Target Date 08/21/21      SLP LONG TERM GOAL #4   Title With minimal cues, pt  will increase speech intelligibility to > 75% at the conversation level.    Baseline goal met, goal updated    Time 12    Period Weeks    Status Revised    Target Date 08/21/21              Plan - 06/17/21 0851     Clinical Impression Statement Pt continues to present with moderate to severe cognitive communication deficits related to anoxic brain injury. 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 Pt shared email that she frequently needs to send for her husband's business. At this time, she is unable to type emails d/t cognitive deficits in processing, problem solving, awareness, impulsivity, reading and spelling.  Skilled ST intervention is required to target pt's cognitive communication deficits to increase her functional independence within the community, work and leisure activities.    Speech Therapy Frequency 2x / week    Duration 12 weeks    Treatment/Interventions Cognitive reorganization;Compensatory techniques;Internal/external aids;SLP instruction and feedback;Patient/family education;Functional tasks;Compensatory strategies    Potential to Achieve Goals Good    Consulted and Agree with Plan of Care Patient    Family Member Consulted pt's husband             Patient will benefit from skilled therapeutic intervention in order to improve the following deficits and impairments:   Cognitive communication deficit  Anoxic brain injury [facility])    Problem List Patient Active Problem List   Diagnosis Date Noted   Multiple lung nodules 03/27/2021   Dysarthria and anarthria 02/11/2021   Sinus tachycardia    Labile blood glucose    Dysphagia    Seizure prophylaxis    Malnutrition of moderate degree 01/23/2021   Anoxic brain injury ([place]) 01/10/2021   Prediabetes    Dyslipidemia    S/P percutaneous endoscopic gastrostomy (PEG) tube placement [name])    Status post tracheostomy ([place])    Aspiration  pneumonia of both lower lobes due to gastric secretions ([place]) 01/05/2021   Acute respiratory failure (HCC)    Hypoxic ischemic encephalopathy    Endotracheal tube present    Cardiac arrest ([place]) 12/02/2020   Essential hypertension 12/09/2011   Gerhart Ruggieri B. Rutherford Nail, M.S.,  CCC-SLP, Mining engineer Rehabilitation Services Office [phone]  Stormy Fabian 06/17/2021, 8:52 AM  [place] MAIN [facility] SERVICES [address] [place], [place], [address] Phone: [phone]   Fax:  [phone]   Name: CLYDEAN POSAS MRN: [id number] Date of Birth: 14-May-1966

## 2021-06-18 ENCOUNTER — Encounter: Payer: Self-pay | Admitting: Occupational Therapy

## 2021-06-20 ENCOUNTER — Ambulatory Visit: Payer: Self-pay | Admitting: Speech Pathology

## 2021-06-20 ENCOUNTER — Ambulatory Visit: Payer: Self-pay | Admitting: Occupational Therapy

## 2021-06-20 ENCOUNTER — Encounter: Payer: Self-pay | Admitting: Occupational Therapy

## 2021-06-20 ENCOUNTER — Other Ambulatory Visit: Payer: Self-pay

## 2021-06-20 DIAGNOSIS — M6281 Muscle weakness (generalized): Secondary | ICD-10-CM

## 2021-06-20 DIAGNOSIS — R2681 Unsteadiness on feet: Secondary | ICD-10-CM

## 2021-06-20 DIAGNOSIS — G931 Anoxic brain damage, not elsewhere classified: Secondary | ICD-10-CM

## 2021-06-20 DIAGNOSIS — R41841 Cognitive communication deficit: Secondary | ICD-10-CM

## 2021-06-20 DIAGNOSIS — R278 Other lack of coordination: Secondary | ICD-10-CM

## 2021-06-20 NOTE — Therapy (Signed)
[place] MAIN [facility] SERVICES [address] [place], [place], [address] Phone: [phone]   Fax:  [phone]  Occupational Therapy Treatment  Patient Details  Name: [name] [name] MRN: [id number] Date of Birth: 02-Feb-1966 No data recorded  Encounter Date: 06/20/2021   OT End of Session - 06/20/21 1025     Visit Number 21    Number of Visits 39    Date for OT Re-Evaluation 08/21/21    Authorization Type Progress reports period starting 02/11/2021    OT Start Time 1015    OT Stop Time 1100    OT Time Calculation (min) 45 min    Activity Tolerance Patient tolerated treatment well    Behavior During Therapy [facility] for tasks assessed/performed             Past Medical History:  Diagnosis Date   Anoxic brain injury ([place])    Cardiac arrest ([place])    Cognitive communication deficit    Hyperlipidemia    Hypertension    Seizures ([place])    Ventricular fibrillation ([place])     Past Surgical History:  Procedure Laterality Date   PEG PLACEMENT N/A 12/13/2020   Procedure: PERCUTANEOUS ENDOSCOPIC GASTROSTOMY (PEG) PLACEMENT;  Surgeon: Lesly Rubenstein, MD;  Location: [facility] ENDOSCOPY;  Service: Endoscopy;  Laterality: N/A;   TRACHEOSTOMY TUBE PLACEMENT N/A 12/13/2020   Procedure: TRACHEOSTOMY;  Surgeon: Clyde Canterbury, MD;  Location: [facility] ORS;  Service: ENT;  Laterality: N/A;    There were no vitals filed for this visit.   Subjective Assessment - 06/20/21 1020     Subjective  Pt denies any pain, reports she has been working on strengthening of her hand at home using hand gripper.    Pertinent History Pt. is a 55 y.o. female who went into cardiac arrest, and was admitted to [facility] on 12/02/2020. Pt. was diagnosed with Anoxic Brain Injury following the Cardiac Arrest. When medically stable, the pt. was tranferred to [place] rehab. The patient was discharged home on 02/08/2021, and is ready to begin outpatient OT services. Pt. was  independent, and working prior to the onset. Pt. Has very supportive family. Pt. Enjoys gardening.    Patient Stated Goals Pt would like to be as independent as possible.    Currently in Pain? No/denies    Multiple Pain Sites No             Therapeutic Exercises: Pt seen for Grip strengthening 2 sets with 12#, 1 set 17.9# cues for proper positioning of hand on gripper and for sustained gripping patterns.    Finger strengthening with use of Bulletin board with push pins, moderate resistance to push into board.  Pt completing 20 pins to place and remove.    Neuro: Fine motor coordination skills with use of medium nylon rope for unknotting with bilateral hand use, cues for prehension patterns.  Cards with scatter pile method for visual scanning to place cards in ascending order.  Cues for sequencing of cards especially with face cards.  Difficulty with picking up cards from flat surface and requires pulling cards to edge of the table.    Response to tx: Patient continues to improve with grip strengthening with use of resistive hand gripper, effort to place push pins in moderate resistive board.  Performed well with card scatter pile for visual scanning, attention and manipulation of cards.  Requires increased time for scanning when all cards are on the table, difficulty with hand skills to pick  up cards from flat surface.  Continue to work towards goals in plan of care to maximize safety and independence in necessary daily tasks.                        OT Education - 06/20/21 1024     Education Details Goals, POC    Person(s) Educated Patient    Methods Explanation;Demonstration    Comprehension Verbalized understanding;Returned demonstration                 OT Long Term Goals - 06/16/21 1434       OT LONG TERM GOAL #1   Title Pt. will be able to independently sign her name with 100% legibility.    Baseline Pt. continues to present with limited signature.  Eval: Signature not legible, however was able to sign on the line with no deviation above, or below the line.  Recert: Signature with 75% legibility. Spacing words on a line is impaired and requires extensive cueing to scan L to R across the paper. 20th visit: 75% legible printed, 50% signature.    Time 12    Period Weeks    Status On-going    Target Date 08/21/21      OT LONG TERM GOAL #2   Title Pt. will increase right shoulder AROM to be able to independently retrieve items from closets, and cabinets.    Baseline Pt. has improved with AROM, and is now able to retrieve items from closets, and cabinets. Eval: Pt. presents with limited ROM. Recert: Pt has good AROM overhead but repetitive reaching causes fatigue and need for rest breaks. 20th visit: pt. has progressed with AROM, and functional reaching.    Time 12    Period Weeks    Status Achieved      OT LONG TERM GOAL #3   Title Pt. will increase right grip strength by 5# to be able hold a full glass    Baseline Pt. is improving with formulating a composite fist. Eval: limited grip strength. Limited bilateral digit flexion, unable to make a full composite fist. 20th visit: Pt. is making excellent progress with bilateral grip strengt. Pt. is able to hold, a d drink from a full glass.    Time 12    Period Weeks    Status On-going    Target Date 08/21/21      OT LONG TERM GOAL #4   Title Pt. will independently access a computer, and type a one sentence email correspondence.    Baseline 20th visit: Pt. continues to have difficulty tying a sentence leggibly. Pt. is able to type a one sentence correspondence with multiple mistypes. Eval: Pt. is unable to to perform    Time 12    Period Weeks    Status On-going    Target Date 08/21/21      OT LONG TERM GOAL #5   Title Pt. will improve bilateral [facility] skills to be able to grasp, and manipulate small objects during ADLs.    Baseline 20th visit: Pt. has made excellent progress with bialteral  [facility] skills, and grasping small objects. Pt. continues to have difficulty grasping, and manipulating small objects. Eval: Pt. presents with limited [facility] skills with difficulty picking,and manipulating small objects.    Time 12    Period Weeks    Status On-going    Target Date 08/21/21      OT LONG TERM GOAL #6   Title Pt. will improve UE  strength by 2 mm grades to assist with ADLs, and IADLS,    Baseline [id number] visit: BUE strength; 4+/5 overall. Pt. continues to present with limited BUE stregth. Pt. is engaging in more ADL,a nd IADL tasks at home. Eval: pt. has difficulty performing.    Time 12    Period Weeks    Status On-going    Target Date 08/21/21      OT LONG TERM GOAL #7   Title Pt. will complete light meal preparation with minA    Baseline 06/16/2021: Pt. is assisting her daughter with cooking chiscken, bacon, slamon, and grits. Pt. is preparing meals with her daughter's assist. Eval: Pt. is unable to prepare light meal preparation.    Time 12    Period Weeks    Target Date 08/21/21      OT LONG TERM GOAL #8   Title Pt. will perform light home management tasks with minA    Baseline 06/16/2021: Pt. is independent with bedmaking, vacuuming, dishes, dusting, sweeping the porch. Pt. is independent with her, and har husband's laundry. Pt.'s daughter helps her down the outdoor steps [place] of the stones. Pt. is engaging in more home management tasks. Eval: pt. is unable to perform    Time 12    Status On-going    Target Date 08/21/21      OT LONG TERM GOAL  #9   TITLE Pt will demonstrate improved visual/spatial skills as demonstrated by ability to adequately space words in a sentence with min vc.    Baseline 20th visit: Pt. requires cues, and assist for spacing when writing sentences.    Time 12    Period Weeks    Status On-going    Target Date 08/21/21                   Plan - 06/20/21 1025     Clinical Impression Statement Patient continues to improve with grip  strengthening with use of resistive hand gripper, effort to place push pins in moderate resistive board.  Performed well with card scatter pile for visual scanning, attention and manipulation of cards.  Requires increased time for scanning when all cards are on the table, difficulty with hand skills to pick up cards from flat surface.  Continue to work towards goals in plan of care to maximize safety and independence in necessary daily tasks.    OT Occupational Profile and History Problem Focused Assessment - Including review of records relating to presenting problem    Occupational performance deficits (Please refer to evaluation for details): ADL's;IADL's    Body Structure / Function / Physical Skills ADL;IADL;FMC;ROM;Strength    Rehab Potential Good    Clinical Decision Making Several treatment options, min-mod task modification necessary    Comorbidities Affecting Occupational Performance: May have comorbidities impacting occupational performance    Modification or Assistance to Complete Evaluation  Min-Moderate modification of tasks or assist with assess necessary to complete eval    OT Frequency 2x / week    OT Duration 12 weeks    OT Treatment/Interventions Self-care/ADL training;Neuromuscular education;Therapeutic activities;Patient/family education;DME and/or AE instruction;Visual/perceptual remediation/compensation    Consulted and Agree with Plan of Care Patient             Patient will benefit from skilled therapeutic intervention in order to improve the following deficits and impairments:   Body Structure / Function / Physical Skills: ADL, IADL, FMC, ROM, Strength       Visit Diagnosis: Muscle weakness (generalized)  Other  lack of coordination  Unsteadiness on feet    Problem List Patient Active Problem List   Diagnosis Date Noted   Multiple lung nodules 03/27/2021   Dysarthria and anarthria 02/11/2021   Sinus tachycardia    Labile blood glucose    Dysphagia     Seizure prophylaxis    Malnutrition of moderate degree 01/23/2021   Anoxic brain injury ([place]) 01/10/2021   Prediabetes    Dyslipidemia    S/P percutaneous endoscopic gastrostomy (PEG) tube placement [facility])    Status post tracheostomy ([place])    Aspiration pneumonia of both lower lobes due to gastric secretions ([place]) 01/05/2021   Acute respiratory failure (HCC)    Hypoxic ischemic encephalopathy    Endotracheal tube present    Cardiac arrest ([place]) 12/02/2020   Essential hypertension 12/09/2011   [name] [name], OTR/L, CLT  [name] [name] 06/21/2021, 5:09 PM  [place] MAIN [facility] SERVICES [address] [place], [place], [address] Phone: [phone]   Fax:  [phone]  Name: [name] [name] MRN: [id number] Date of Birth: 29-Jul-1966

## 2021-06-21 NOTE — Therapy (Signed)
[place] MAIN [facility] SERVICES [address] [place], [place], [address] Phone: [phone]   Fax:  [phone]  Speech Language Pathology Treatment  Patient Details  Name: [name] [name] MRN: [id number] Date of Birth: 1966/06/05 Referring Provider (SLP): Mar[place]e [place], [place]   Encounter Date: 06/20/2021   End of Session - 06/21/21 1216     Visit Number 27    Number of Visits 16    Date for SLP Re-Evaluation 08/21/21    Authorization Type Self Pay    Authorization Time Period 05/28/2021 thru 08/21/2021    Authorization - Visit Number 7    Progress Note Due on Visit 10    SLP Start Time 1100    SLP Stop Time  0370    SLP Time Calculation (min) 45 min    Activity Tolerance Patient tolerated treatment well             Past Medical History:  Diagnosis Date   Anoxic brain injury ([place])    Cardiac arrest ([place])    Cognitive communication deficit    Hyperlipidemia    Hypertension    Seizures ([place])    Ventricular fibrillation ([place])     Past Surgical History:  Procedure Laterality Date   PEG PLACEMENT N/A 12/13/2020   Procedure: PERCUTANEOUS ENDOSCOPIC GASTROSTOMY (PEG) PLACEMENT;  Surgeon: Lesly Rubenstein, MD;  Location: [facility] ENDOSCOPY;  Service: Endoscopy;  Laterality: N/A;   TRACHEOSTOMY TUBE PLACEMENT N/A 12/13/2020   Procedure: TRACHEOSTOMY;  Surgeon: Clyde Canterbury, MD;  Location: [facility] ORS;  Service: ENT;  Laterality: N/A;    There were no vitals filed for this visit.   Subjective Assessment - 06/21/21 1010     Subjective "I am having a good day"    Currently in Pain? No/denies                   ADULT SLP TREATMENT - 06/21/21 0001       Treatment Provided   Treatment provided Cognitive-Linquistic      Cognitive-Linquistic Treatment   Treatment focused on Cognition    Skilled Treatment COGNITION: MEMORY - pt read pre-reader 1 and was ablet o recall information with 75% accuracy              SLP  Education - 06/21/21 1216     Education Details provided    Person(s) Educated Patient    Methods Explanation    Comprehension Verbalized understanding;Need further instruction              SLP Short Term Goals - 05/29/21 1536       SLP SHORT TERM GOAL #1   Title Pt will independently demonstrate selective attention in mildly distracting environment for 15 minutes.    Status Achieved      SLP SHORT TERM GOAL #2   Title With moderate cues, pt will utilize speech intelligibility strategies to achieve > 90% intelligibility at the sentence level.    Baseline Goal met, revised    Time 10    Period --   sessions   Status Revised      SLP SHORT TERM GOAL #3   Title With minimal cues, pt will utilize compensatory memory strategies to recall therapy sessions for week as well as order of therapy in 8 out of 10 sessions.    Baseline goal met, goal upgraded    Time 10    Period --   sessions   Status Revised  SLP SHORT TERM GOAL #4   Title With moderate assistance, pt will complete basic problem solving tasks with >95% accuracy.    Time 10    Period --   sessions   Status On-going              SLP Long Term Goals - 05/29/21 1538       SLP LONG TERM GOAL #2   Title Pt will independently complete functional problem solving for basic and familiar tasks.    Time 12    Period Weeks    Status On-going    Target Date 08/21/21      SLP LONG TERM GOAL #3   Title Pt will demonstrate selective attention in a moderately distracting environment for 15 minutes with < 1 redirection to task.    Time 12    Period Weeks    Status On-going    Target Date 08/21/21      SLP LONG TERM GOAL #4   Title With minimal cues, pt will increase speech intelligibility to > 75% at the conversation level.    Baseline goal met, goal updated    Time 12    Period Weeks    Status Revised    Target Date 08/21/21              Plan - 06/21/21 1217     Clinical Impression Statement Pt  continues to present with moderate to severe cognitive communication deficits related to anoxic brain injury. 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 Pt expresses desire to read, comprehend and recall information read.  Skilled ST intervention is required to target pt's cognitive communication deficits to increase her functional independence within the community, work and leisure activities.    Speech Therapy Frequency 2x / week    Duration 12 weeks    Treatment/Interventions Cognitive reorganization;Compensatory techniques;Internal/external aids;SLP instruction and feedback;Patient/family education;Functional tasks;Compensatory strategies    Potential to Achieve Goals Good    Potential Considerations Severity of impairments;Ability to learn/carryover information    Consulted and Agree with Plan of Care Patient             Patient will benefit from skilled therapeutic intervention in order to improve the following deficits and impairments:   Cognitive communication deficit  Anoxic brain injury [facility])    Problem List Patient Active Problem List   Diagnosis Date Noted   Multiple lung nodules 03/27/2021   Dysarthria and anarthria 02/11/2021   Sinus tachycardia    Labile blood glucose    Dysphagia    Seizure prophylaxis    Malnutrition of moderate degree 01/23/2021   Anoxic brain injury ([place]) 01/10/2021   Prediabetes    Dyslipidemia    S/P percutaneous endoscopic gastrostomy (PEG) tube placement [facility])    Status post tracheostomy ([place])    Aspiration pneumonia of both lower lobes due to gastric secretions ([place]) 01/05/2021   Acute respiratory failure (HCC)    Hypoxic ischemic encephalopathy    Endotracheal tube present    Cardiac arrest ([place]) 12/02/2020   Essential hypertension 12/09/2011   Roseland Braun B. Rutherford Nail M.S., CCC-SLP, [place] Pathologist Rehabilitation Services Office  [phone]  Stormy Fabian 06/21/2021, 12:18 PM  [place] MAIN [facility] SERVICES [address] [place], [place], [address] Phone: [phone]   Fax:  [phone]   Name: [name] [name] MRN: [id number] Date of Birth: 1965/11/25

## 2021-06-23 DIAGNOSIS — Z8616 Personal history of COVID-19: Secondary | ICD-10-CM

## 2021-06-23 HISTORY — DX: Personal history of COVID-19: Z86.16

## 2021-06-24 ENCOUNTER — Ambulatory Visit: Payer: Self-pay | Admitting: Occupational Therapy

## 2021-06-24 ENCOUNTER — Ambulatory Visit: Payer: Self-pay | Attending: Physician Assistant | Admitting: Speech Pathology

## 2021-06-24 ENCOUNTER — Other Ambulatory Visit: Payer: Self-pay | Admitting: Physician Assistant

## 2021-06-24 ENCOUNTER — Other Ambulatory Visit: Payer: Self-pay

## 2021-06-24 DIAGNOSIS — M6281 Muscle weakness (generalized): Secondary | ICD-10-CM | POA: Insufficient documentation

## 2021-06-24 DIAGNOSIS — R41841 Cognitive communication deficit: Secondary | ICD-10-CM | POA: Insufficient documentation

## 2021-06-24 DIAGNOSIS — R2681 Unsteadiness on feet: Secondary | ICD-10-CM | POA: Insufficient documentation

## 2021-06-24 DIAGNOSIS — G931 Anoxic brain damage, not elsewhere classified: Secondary | ICD-10-CM | POA: Insufficient documentation

## 2021-06-24 DIAGNOSIS — R278 Other lack of coordination: Secondary | ICD-10-CM | POA: Insufficient documentation

## 2021-06-24 DIAGNOSIS — Z1231 Encounter for screening mammogram for malignant neoplasm of breast: Secondary | ICD-10-CM

## 2021-06-25 ENCOUNTER — Ambulatory Visit: Payer: Self-pay | Admitting: Occupational Therapy

## 2021-06-25 ENCOUNTER — Encounter: Payer: Self-pay | Admitting: Occupational Therapy

## 2021-06-25 ENCOUNTER — Ambulatory Visit: Payer: Self-pay | Admitting: Speech Pathology

## 2021-06-25 DIAGNOSIS — G931 Anoxic brain damage, not elsewhere classified: Secondary | ICD-10-CM

## 2021-06-25 DIAGNOSIS — M6281 Muscle weakness (generalized): Secondary | ICD-10-CM

## 2021-06-25 DIAGNOSIS — R41841 Cognitive communication deficit: Secondary | ICD-10-CM

## 2021-06-25 DIAGNOSIS — R278 Other lack of coordination: Secondary | ICD-10-CM

## 2021-06-25 NOTE — Therapy (Signed)
[place] MAIN [name] SERVICES [address] [place], [place], [address] Phone: [phone]   Fax:  [phone]  Occupational Therapy Treatment  Patient Details  Name: [name] [name] MRN: [id number] Date of Birth: 19-Mar-1966 No data recorded  Encounter Date: 06/25/2021   OT End of Session - 06/25/21 1433     Visit Number 22    Number of Visits 39    Date for OT Re-Evaluation 08/21/21    Authorization Type Progress reports period starting 02/11/2021    OT Start Time 1338    OT Stop Time 1417    OT Time Calculation (min) 39 min    Activity Tolerance Patient tolerated treatment well    Behavior During Therapy [facility] for tasks assessed/performed             Past Medical History:  Diagnosis Date   Anoxic brain injury ([place])    Cardiac arrest ([place])    Cognitive communication deficit    Hyperlipidemia    Hypertension    Seizures ([place])    Ventricular fibrillation ([place])     Past Surgical History:  Procedure Laterality Date   PEG PLACEMENT N/A 12/13/2020   Procedure: PERCUTANEOUS ENDOSCOPIC GASTROSTOMY (PEG) PLACEMENT;  Surgeon: Lesly Rubenstein, MD;  Location: [facility] ENDOSCOPY;  Service: Endoscopy;  Laterality: N/A;   TRACHEOSTOMY TUBE PLACEMENT N/A 12/13/2020   Procedure: TRACHEOSTOMY;  Surgeon: Clyde Canterbury, MD;  Location: [facility] ORS;  Service: ENT;  Laterality: N/A;    There were no vitals filed for this visit.   Subjective Assessment - 06/25/21 1430     Subjective  Pt. was late for the appointment due to confusion with her schedule time today.    Patient is accompanied by: Family member    Pertinent History Pt. is a 55 y.o. female who went into cardiac arrest, and was admitted to [name] on 12/02/2020. Pt. was diagnosed with Anoxic Brain Injury following the Cardiac Arrest. When medically stable, the pt. was tranferred to [name]. The patient was discharged home on 02/08/2021, and is ready to begin outpatient OT services.  Pt. was independent, and working prior to the onset. Pt. Has very supportive family. Pt. Enjoys gardening.    Currently in Pain? No/denies            Pt. was administered the Attention To Detail portion of the BiVABA, Telephone number copy with 100% accuracy, Design Copying a house, flower, and clock. Pt. Required increased time to copy each design. Pt. was able to copy the house accurately,  Pt. had difficulty copying the flower, and initially copied #'s 1-5 outside of the clock. Pt. was able to  initiate correcting the clock without cues. Pt. Was administered [employer]. Pt. Was able to identify the dot at all numbers with the left eye, and at most numbers with the right eye with the exception to # 32. The vision assessments were administered to help determine how vision may be affecting ADL, and IADL functioning.  Pt. Continues to work on improving UE functioning, writing, typing, and implementing visual compensatory strategies during ADLs, and IADL tasks.                          OT Education - 06/25/21 1433     Education Details Visual functioning    Person(s) Educated Patient    Methods Explanation;Demonstration    Comprehension Verbalized understanding;Returned demonstration  OT Long Term Goals - 06/16/21 1434       OT LONG TERM GOAL #1   Title Pt. will be able to independently sign her name with 100% legibility.    Baseline Pt. continues to present with limited signature. Eval: Signature not legible, however was able to sign on the line with no deviation above, or below the line.  Recert: Signature with 75% legibility. Spacing words on a line is impaired and requires extensive cueing to scan L to R across the paper. 20th visit: 75% legible printed, 50% signature.    Time 12    Period Weeks    Status On-going    Target Date 08/21/21      OT LONG TERM GOAL #2   Title Pt. will increase right shoulder AROM to be able to  independently retrieve items from closets, and cabinets.    Baseline Pt. has improved with AROM, and is now able to retrieve items from closets, and cabinets. Eval: Pt. presents with limited ROM. Recert: Pt has good AROM overhead but repetitive reaching causes fatigue and need for rest breaks. 20th visit: pt. has progressed with AROM, and functional reaching.    Time 12    Period Weeks    Status Achieved      OT LONG TERM GOAL #3   Title Pt. will increase right grip strength by 5# to be able hold a full glass    Baseline Pt. is improving with formulating a composite fist. Eval: limited grip strength. Limited bilateral digit flexion, unable to make a full composite fist. 20th visit: Pt. is making excellent progress with bilateral grip strengt. Pt. is able to hold, a d drink from a full glass.    Time 12    Period Weeks    Status On-going    Target Date 08/21/21      OT LONG TERM GOAL #4   Title Pt. will independently access a computer, and type a one sentence email correspondence.    Baseline 20th visit: Pt. continues to have difficulty tying a sentence leggibly. Pt. is able to type a one sentence correspondence with multiple mistypes. Eval: Pt. is unable to to perform    Time 12    Period Weeks    Status On-going    Target Date 08/21/21      OT LONG TERM GOAL #5   Title Pt. will improve bilateral [facility] skills to be able to grasp, and manipulate small objects during ADLs.    Baseline 20th visit: Pt. has made excellent progress with bialteral [facility] skills, and grasping small objects. Pt. continues to have difficulty grasping, and manipulating small objects. Eval: Pt. presents with limited [facility] skills with difficulty picking,and manipulating small objects.    Time 12    Period Weeks    Status On-going    Target Date 08/21/21      OT LONG TERM GOAL #6   Title Pt. will improve UE strength by 2 mm grades to assist with ADLs, and IADLS,    Baseline [id number] visit: BUE strength; 4+/5 overall. Pt.  continues to present with limited BUE stregth. Pt. is engaging in more ADL,a nd IADL tasks at home. Eval: pt. has difficulty performing.    Time 12    Period Weeks    Status On-going    Target Date 08/21/21      OT LONG TERM GOAL #7   Title Pt. will complete light meal preparation with minA    Baseline 06/16/2021: Pt. is  assisting her daughter with cooking chiscken, bacon, slamon, and grits. Pt. is preparing meals with her daughter's assist. Eval: Pt. is unable to prepare light meal preparation.    Time 12    Period Weeks    Target Date 08/21/21      OT LONG TERM GOAL #8   Title Pt. will perform light home management tasks with minA    Baseline 06/16/2021: Pt. is independent with bedmaking, vacuuming, dishes, dusting, sweeping the porch. Pt. is independent with her, and har husband's laundry. Pt.'s daughter helps her down the outdoor steps [place] of the stones. Pt. is engaging in more home management tasks. Eval: pt. is unable to perform    Time 12    Status On-going    Target Date 08/21/21      OT LONG TERM GOAL  #9   TITLE Pt will demonstrate improved visual/spatial skills as demonstrated by ability to adequately space words in a sentence with min vc.    Baseline 20th visit: Pt. requires cues, and assist for spacing when writing sentences.    Time 12    Period Weeks    Status On-going    Target Date 08/21/21                   Plan - 06/25/21 1434     Clinical Impression Statement Pt. was administered the Attention To Detail portion of the BiVABA, Telephone number copy with 100% accuracy, Design Copying a house, flower, and clock. Pt. Required increased time to copy each design. Pt. was able to copy the house accurately,  Pt. had difficulty copying the flower, and initially copied #'s 1-5 outside of the clock. Pt. was able to  initiate correcting the clock without cues. Pt. Was administered [employer]. Pt. Was able to identify the dot at all numbers with the left  eye, and at most numbers with the right eye with the exception to # 32. The vision assessments were administered to help determine how vision may be affecting ADL, and IADL functioning.  Pt. Continues to work on improving UE functioning, writing, typing, and implementing visual compensatory strategies during ADLs, and IADL tasks.      OT Occupational Profile and History Problem Focused Assessment - Including review of records relating to presenting problem    Occupational performance deficits (Please refer to evaluation for details): ADL's;IADL's    Body Structure / Function / Physical Skills ADL;IADL;FMC;ROM;Strength    [name] Potential Good    Clinical Decision Making Several treatment options, min-mod task modification necessary    Comorbidities Affecting Occupational Performance: May have comorbidities impacting occupational performance    Modification or Assistance to Complete Evaluation  Min-Moderate modification of tasks or assist with assess necessary to complete eval    OT Frequency 2x / week    OT Duration 12 weeks    OT Treatment/Interventions Self-care/ADL training;Neuromuscular education;Therapeutic activities;Patient/family education;DME and/or AE instruction;Visual/perceptual remediation/compensation    Consulted and Agree with Plan of Care Patient             Patient will benefit from skilled therapeutic intervention in order to improve the following deficits and impairments:   Body Structure / Function / Physical Skills: ADL, IADL, FMC, ROM, Strength       Visit Diagnosis: Muscle weakness (generalized)  Other lack of coordination    Problem List Patient Active Problem List   Diagnosis Date Noted   Multiple lung nodules 03/27/2021   Dysarthria and anarthria 02/11/2021   Sinus tachycardia  Labile blood glucose    Dysphagia    Seizure prophylaxis    Malnutrition of moderate degree 01/23/2021   Anoxic brain injury ([place]) 01/10/2021   Prediabetes     Dyslipidemia    S/P percutaneous endoscopic gastrostomy (PEG) tube placement [name])    Status post tracheostomy ([place])    Aspiration pneumonia of both lower lobes due to gastric secretions ([place]) 01/05/2021   Acute respiratory failure ([place])    Hypoxic ischemic encephalopathy    Endotracheal tube present    Cardiac arrest ([place]) 12/02/2020   Essential hypertension 12/09/2011    Harrel Carina, MS, OTR/L 06/25/2021, 4:17 PM  [place] MAIN [name] SERVICES [address] [place], [place], [address] Phone: [phone]   Fax:  [phone]  Name: DUNG DEWHIRST MRN: [id number] Date of Birth: [birth year]

## 2021-06-25 NOTE — Therapy (Signed)
[place] MAIN [facility] SERVICES [address] [place], [place], [address] Phone: [phone]   Fax:  [phone]  Speech Language Pathology Treatment  Patient Details  Name: [name] [name] MRN: [id number] Date of Birth: 1966-09-09 Referring Provider (SLP): [place], [place]   Encounter Date: 06/24/2021   End of Session - 06/25/21 0629     Visit Number 28    Number of Visits 27    Date for SLP Re-Evaluation 08/21/21    Authorization Type Self Pay    Authorization Time Period 05/28/2021 thru 08/21/2021    Authorization - Visit Number 8    Progress Note Due on Visit 10    SLP Start Time 1000    SLP Stop Time  7412    SLP Time Calculation (min) 45 min    Activity Tolerance Patient tolerated treatment well             Past Medical History:  Diagnosis Date   Anoxic brain injury ([place])    Cardiac arrest ([place])    Cognitive communication deficit    Hyperlipidemia    Hypertension    Seizures ([place])    Ventricular fibrillation ([place])     Past Surgical History:  Procedure Laterality Date   PEG PLACEMENT N/A 12/13/2020   Procedure: PERCUTANEOUS ENDOSCOPIC GASTROSTOMY (PEG) PLACEMENT;  Surgeon: Lesly Rubenstein, MD;  Location: [facility] ENDOSCOPY;  Service: Endoscopy;  Laterality: N/A;   TRACHEOSTOMY TUBE PLACEMENT N/A 12/13/2020   Procedure: TRACHEOSTOMY;  Surgeon: Clyde Canterbury, MD;  Location: [facility] ORS;  Service: ENT;  Laterality: N/A;    There were no vitals filed for this visit.   Subjective Assessment - 06/25/21 0627     Subjective "I have an appt with Mimi at 11am today"    Patient is accompained by: Family member    Currently in Pain? No/denies                   ADULT SLP TREATMENT - 06/25/21 0001       Treatment Provided   Treatment provided Cognitive-Linquistic      Cognitive-Linquistic Treatment   Treatment focused on Cognition;Patient/family/caregiver education    Skilled Treatment COGNITION: MEMORY - pt read  pre-reader 1 "Baby Animals" and was able to recall basic information with 80% accuracy              SLP Education - 06/25/21 0628     Education Details reading, recall and generative naming tasks to use during long haul trips with her husband    Person(s) Educated Patient;Spouse    Methods Explanation;Demonstration;Verbal cues    Comprehension Verbalized understanding;Need further instruction              SLP Short Term Goals - 05/29/21 [place] #1   Title Pt will independently demonstrate selective attention in mildly distracting environment for 15 minutes.    Status Achieved      SLP SHORT TERM GOAL #2   Title With moderate cues, pt will utilize speech intelligibility strategies to achieve > 90% intelligibility at the sentence level.    Baseline Goal met, revised    Time 10    Period --   sessions   Status Revised      SLP SHORT TERM GOAL #3   Title With minimal cues, pt will utilize compensatory memory strategies to recall therapy sessions for week as well as order of therapy in 8 out of 10  sessions.    Baseline goal met, goal upgraded    Time 10    Period --   sessions   Status Revised      SLP SHORT TERM GOAL #4   Title With moderate assistance, pt will complete basic problem solving tasks with >95% accuracy.    Time 10    Period --   sessions   Status On-going              SLP Long Term Goals - 05/29/21 1538       SLP LONG TERM GOAL #2   Title Pt will independently complete functional problem solving for basic and familiar tasks.    Time 12    Period Weeks    Status On-going    Target Date 08/21/21      SLP LONG TERM GOAL #3   Title Pt will demonstrate selective attention in a moderately distracting environment for 15 minutes with < 1 redirection to task.    Time 12    Period Weeks    Status On-going    Target Date 08/21/21      SLP LONG TERM GOAL #4   Title With minimal cues, pt will increase speech intelligibility  to > 75% at the conversation level.    Baseline goal met, goal updated    Time 12    Period Weeks    Status Revised    Target Date 08/21/21              Plan - 06/25/21 0630     Clinical Impression Statement Pt continues to present with moderate to severe cognitive communication deficits related to anoxic brain injury. 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 Pt expresses desire to read, comprehend and recall information read.  Skilled ST intervention is required to target pt's cognitive communication deficits to increase her functional independence within the community, work and leisure activities.    Speech Therapy Frequency 2x / week    Duration 12 weeks    Treatment/Interventions Cognitive reorganization;Compensatory techniques;Internal/external aids;SLP instruction and feedback;Patient/family education;Functional tasks;Compensatory strategies    Potential to Achieve Goals Good    Potential Considerations Severity of impairments;Ability to learn/carryover information    Consulted and Agree with Plan of Care Patient;Family member/caregiver    Family Member Consulted pt's husband             Patient will benefit from skilled therapeutic intervention in order to improve the following deficits and impairments:   Cognitive communication deficit  Anoxic brain injury [facility])    Problem List Patient Active Problem List   Diagnosis Date Noted   Multiple lung nodules 03/27/2021   Dysarthria and anarthria 02/11/2021   Sinus tachycardia    Labile blood glucose    Dysphagia    Seizure prophylaxis    Malnutrition of moderate degree 01/23/2021   Anoxic brain injury ([place]) 01/10/2021   Prediabetes    Dyslipidemia    S/P percutaneous endoscopic gastrostomy (PEG) tube placement [facility])    Status post tracheostomy ([place])    Aspiration pneumonia of both lower lobes due to gastric secretions ([place]) 01/05/2021    Acute respiratory failure ([facility])    Hypoxic ischemic encephalopathy    Endotracheal tube present    Cardiac arrest ([place]) 12/02/2020   Essential hypertension 12/09/2011   Aaryav Hopfensperger B. Rutherford Nail M.S., [place], [place] Office [phone]  Stormy Fabian 06/25/2021, 6:32 AM  [facility]  [place] MAIN [facility] SERVICES [place], [place], [address] Phone: [phone]   Fax:  [phone]   Name: [name] MRN: [id number] Date of Birth: 04/27/1966

## 2021-06-26 ENCOUNTER — Ambulatory Visit: Payer: Self-pay | Admitting: Occupational Therapy

## 2021-06-26 ENCOUNTER — Ambulatory Visit: Payer: Self-pay | Admitting: Speech Pathology

## 2021-06-27 NOTE — Therapy (Signed)
[place] MAIN [facility] SERVICES [address] [place], [place], [address] Phone: [phone]   Fax:  [phone]  Speech Language Pathology Treatment  Patient Details  Name: [name] MRN: [id number] Date of Birth: January 17, 1966 Referring Provider (SLP): [place], [place]   Encounter Date: 06/25/2021   End of Session - 06/27/21 1443     Visit Number 29    Number of Visits 36    Date for SLP Re-Evaluation 08/21/21    Authorization Type Self Pay    Authorization Time Period 05/28/2021 thru 08/21/2021    Authorization - Visit Number 9    Progress Note Due on Visit 10    SLP Start Time 1432    SLP Stop Time  1455    SLP Time Calculation (min) 23 min    Activity Tolerance Patient tolerated treatment well             Past Medical History:  Diagnosis Date   Anoxic brain injury ([place])    Cardiac arrest ([place])    Cognitive communication deficit    Hyperlipidemia    Hypertension    Seizures ([place])    Ventricular fibrillation ([place])     Past Surgical History:  Procedure Laterality Date   PEG PLACEMENT N/A 12/13/2020   Procedure: PERCUTANEOUS ENDOSCOPIC GASTROSTOMY (PEG) PLACEMENT;  Surgeon: Lesly Rubenstein, MD;  Location: [facility] ENDOSCOPY;  Service: Endoscopy;  Laterality: N/A;   TRACHEOSTOMY TUBE PLACEMENT N/A 12/13/2020   Procedure: TRACHEOSTOMY;  Surgeon: Clyde Canterbury, MD;  Location: [facility] ORS;  Service: ENT;  Laterality: N/A;    There were no vitals filed for this visit.   Subjective Assessment - 06/27/21 1441     Subjective Pt apologetic for being late to sessions    Patient is accompained by: Family member    Currently in Pain? No/denies                   ADULT SLP TREATMENT - 06/27/21 0001       Treatment Provided   Treatment provided Cognitive-Linquistic      Cognitive-Linquistic Treatment   Treatment focused on Cognition;Patient/family/caregiver education    Skilled Treatment COGNITION: MEMORY - assisted pt  with setting multiple alarms on her phone, alarms set for 2 hours prior to her various appts, ringtone changed to louder sound              SLP Education - 06/27/21 1443     Education Details using alarm on phone as an external memory aid    Person(s) Educated Patient    Methods Explanation;Demonstration;Verbal cues    Comprehension Need further instruction;Verbalized understanding              SLP Short Term Goals - 05/29/21 1536       SLP SHORT TERM GOAL #1   Title Pt will independently demonstrate selective attention in mildly distracting environment for 15 minutes.    Status Achieved      SLP SHORT TERM GOAL #2   Title With moderate cues, pt will utilize speech intelligibility strategies to achieve > 90% intelligibility at the sentence level.    Baseline Goal met, revised    Time 10    Period --   sessions   Status Revised      SLP SHORT TERM GOAL #3   Title With minimal cues, pt will utilize compensatory memory strategies to recall therapy sessions for week as well as order of therapy in 8 out of 10  sessions.    Baseline goal met, goal upgraded    Time 10    Period --   sessions   Status Revised      SLP SHORT TERM GOAL #4   Title With moderate assistance, pt will complete basic problem solving tasks with >95% accuracy.    Time 10    Period --   sessions   Status On-going              SLP Long Term Goals - 05/29/21 1538       SLP LONG TERM GOAL #2   Title Pt will independently complete functional problem solving for basic and familiar tasks.    Time 12    Period Weeks    Status On-going    Target Date 08/21/21      SLP LONG TERM GOAL #3   Title Pt will demonstrate selective attention in a moderately distracting environment for 15 minutes with < 1 redirection to task.    Time 12    Period Weeks    Status On-going    Target Date 08/21/21      SLP LONG TERM GOAL #4   Title With minimal cues, pt will increase speech intelligibility to > 75% at  the conversation level.    Baseline goal met, goal updated    Time 12    Period Weeks    Status Revised    Target Date 08/21/21              Plan - 06/27/21 1444     Clinical Impression Statement Pt continues to present with moderate to severe cognitive communication deficits related to anoxic brain injury. 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 Pt expresses desire to read, comprehend and recall information read. Additionally, in today's session, pt instructed in using external memory aid (alarm on phone). Skilled ST intervention is required to target pt's cognitive communication deficits to increase her functional independence within the community, work and leisure activities.    Speech Therapy Frequency 2x / week    Duration 12 weeks    Treatment/Interventions Cognitive reorganization;Compensatory techniques;Internal/external aids;SLP instruction and feedback;Patient/family education;Functional tasks;Compensatory strategies    Potential to Achieve Goals Good    Potential Considerations Severity of impairments;Ability to learn/carryover information    SLP Home Exercise Plan provided, see instruction section    Consulted and Agree with Plan of Care Patient             Patient will benefit from skilled therapeutic intervention in order to improve the following deficits and impairments:   Cognitive communication deficit  Anoxic brain injury [facility])    Problem List Patient Active Problem List   Diagnosis Date Noted   Multiple lung nodules 03/27/2021   Dysarthria and anarthria 02/11/2021   Sinus tachycardia    Labile blood glucose    Dysphagia    Seizure prophylaxis    Malnutrition of moderate degree 01/23/2021   Anoxic brain injury ([place]) 01/10/2021   Prediabetes    Dyslipidemia    S/P percutaneous endoscopic gastrostomy (PEG) tube placement [facility])    Status post tracheostomy ([place])     Aspiration pneumonia of both lower lobes due to gastric secretions ([place]) 01/05/2021   Acute respiratory failure ([facility])    Hypoxic ischemic encephalopathy    Endotracheal tube present    Cardiac arrest ([place]) 12/02/2020   Essential hypertension 12/09/2011   Raydon Chappuis B. Rutherford Nail, M.S., CCC-SLP, CBIS Speech-Language  [place] Office [phone]  Stormy Fabian 06/27/2021, 2:46 PM  [place] MAIN [facility] SERVICES [address] [place], [place], [address] Phone: [phone]   Fax:  [phone]   Name: MALICIA BLASDEL MRN: [id number] Date of Birth: [birth year]

## 2021-06-27 NOTE — Patient Instructions (Signed)
Set alarm on phone for 2 hours prior to each appt

## 2021-07-01 ENCOUNTER — Other Ambulatory Visit: Payer: Self-pay

## 2021-07-01 ENCOUNTER — Ambulatory Visit: Payer: Self-pay | Admitting: Speech Pathology

## 2021-07-01 ENCOUNTER — Ambulatory Visit: Payer: Self-pay | Admitting: Occupational Therapy

## 2021-07-01 DIAGNOSIS — R2681 Unsteadiness on feet: Secondary | ICD-10-CM

## 2021-07-01 DIAGNOSIS — G931 Anoxic brain damage, not elsewhere classified: Secondary | ICD-10-CM

## 2021-07-01 DIAGNOSIS — M6281 Muscle weakness (generalized): Secondary | ICD-10-CM

## 2021-07-01 DIAGNOSIS — R278 Other lack of coordination: Secondary | ICD-10-CM

## 2021-07-01 DIAGNOSIS — R41841 Cognitive communication deficit: Secondary | ICD-10-CM

## 2021-07-02 ENCOUNTER — Ambulatory Visit: Payer: Self-pay | Admitting: Physical Medicine & Rehabilitation

## 2021-07-02 ENCOUNTER — Encounter: Payer: Self-pay | Admitting: Occupational Therapy

## 2021-07-02 NOTE — Therapy (Addendum)
[place] MAIN [facility] SERVICES [name] [place], [place], [address] Phone: [phone]   Fax:  [phone]  Speech Language Pathology Treatment Speech Therapy Progress Note   Dates of reporting period   05/28/2021  to   07/01/2021  Patient Details  Name: [name] [name] MRN: [id number] Date of Birth: [birth year] Referring Provider (SLP): Mar[place]e [place], [place]   Encounter Date: 07/01/2021   End of Session - 07/02/21 1619     Visit Number 30    Number of Visits 84    Date for SLP Re-Evaluation 08/21/21    Authorization Type Self Pay    Authorization Time Period 05/28/2021 thru 08/21/2021    Authorization - Visit Number 10    Progress Note Due on Visit 10    SLP Start Time 1500    SLP Stop Time  1540    SLP Time Calculation (min) 45 min    Activity Tolerance Patient tolerated treatment well             Past Medical History:  Diagnosis Date   Anoxic brain injury ([place])    Cardiac arrest ([place])    Cognitive communication deficit    Hyperlipidemia    Hypertension    Seizures ([place])    Ventricular fibrillation ([place])     Past Surgical History:  Procedure Laterality Date   PEG PLACEMENT N/A 12/13/2020   Procedure: PERCUTANEOUS ENDOSCOPIC GASTROSTOMY (PEG) PLACEMENT;  Surgeon: Lesly Rubenstein, MD;  Location: [facility] ENDOSCOPY;  Service: Endoscopy;  Laterality: N/A;   TRACHEOSTOMY TUBE PLACEMENT N/A 12/13/2020   Procedure: TRACHEOSTOMY;  Surgeon: Clyde Canterbury, MD;  Location: [facility] ORS;  Service: ENT;  Laterality: N/A;    There were no vitals filed for this visit.   Subjective Assessment - 07/02/21 1407     Subjective Pt emotional about speech intelligibility when talking to friend on phone    Currently in Pain? No/denies                   ADULT SLP TREATMENT - 07/02/21 0001       Treatment Provided   Treatment provided Cognitive-Linquistic      Cognitive-Linquistic Treatment   Treatment focused on  Dysarthria;Cognition;Patient/family/caregiver education    Skilled Treatment DYSARTHRIA: maximal cues to increase vocal intensity and over articulation at the phrase level to achieve ~ 60% intelligibility              SLP Education - 07/02/21 1605     Education Details speech intelligibility    Person(s) Educated Patient    Methods Explanation;Demonstration    Comprehension Verbalized understanding;Need further instruction              SLP Short Term Goals -07/01/2021      SLP SHORT TERM GOAL #1   Title Pt will independently demonstrate selective attention in mildly distracting environment for 15 minutes.    Status Achieved      SLP SHORT TERM GOAL #2   Title With moderate cues, pt will utilize speech intelligibility strategies to achieve > 90% intelligibility at the sentence level.    Baseline Goal met, revised    Time 10    Period --   sessions   Status Revised      SLP SHORT TERM GOAL #3   Title With minimal cues, pt will utilize compensatory memory strategies to recall therapy sessions for week as well as order of therapy in 8 out of 10 sessions.    Baseline goal  met, goal upgraded    Time 10    Period --   sessions   Status Revised      SLP SHORT TERM GOAL #4   Title With moderate assistance, pt will complete basic problem solving tasks with >95% accuracy.    Time 10    Period --   sessions   Status On-going              SLP Long Term Goals - 07/01/2021      SLP LONG TERM GOAL #2   Title Pt will independently complete functional problem solving for basic and familiar tasks.    Time 12    Period Weeks    Status On-going    Target Date 08/21/21      SLP LONG TERM GOAL #3   Title Pt will demonstrate selective attention in a moderately distracting environment for 15 minutes with < 1 redirection to task.    Time 12    Period Weeks    Status On-going    Target Date 08/21/21      SLP LONG TERM GOAL #4   Title With minimal cues, pt will increase  speech intelligibility to > 75% at the conversation level.    Baseline goal met, goal updated    Time 12    Period Weeks    Status Revised    Target Date 08/21/21              Plan - 07/02/21 1620     Clinical Impression Statement Pt continues to present with moderate to severe cognitive communication deficits related to anoxic brain injury. 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 Pt expresses desire to read, comprehend and recall information read. Additionally, in today's session, pt instructed in using external memory aid (alarm on phone). Skilled ST intervention is required to target pt's cognitive communication deficits to increase her functional independence within the community, work and leisure activities.    Speech Therapy Frequency 2x / week    Duration 12 weeks    Treatment/Interventions Cognitive reorganization;Compensatory techniques;Internal/external aids;SLP instruction and feedback;Patient/family education;Functional tasks;Compensatory strategies    Potential to Achieve Goals Good    Potential Considerations Severity of impairments;Ability to learn/carryover information    SLP Home Exercise Plan provided, see instruction section    Consulted and Agree with Plan of Care Patient;Family member/caregiver    Family Member Consulted pt's husband             Patient will benefit from skilled therapeutic intervention in order to improve the following deficits and impairments:   Cognitive communication deficit  Anoxic brain injury [facility])    Problem List Patient Active Problem List   Diagnosis Date Noted   Multiple lung nodules 03/27/2021   Dysarthria and anarthria 02/11/2021   Sinus tachycardia    Labile blood glucose    Dysphagia    Seizure prophylaxis    Malnutrition of moderate degree 01/23/2021   Anoxic brain injury ([place]) 01/10/2021   Prediabetes    Dyslipidemia    S/P  percutaneous endoscopic gastrostomy (PEG) tube placement [facility])    Status post tracheostomy ([place])    Aspiration pneumonia of both lower lobes due to gastric secretions ([place]) 01/05/2021   Acute respiratory failure (H[facility])    Hypoxic ischemic encephalopathy    Endotracheal tube present    Cardiac arrest ([place]) 12/02/2020   Essential hypertension 12/09/2011   Brittay Mogle B. Rutherford Nail, M.S., CCC-SLP, CBIS  [place] Office [phone]  Stormy Fabian 07/02/2021, 4:20 PM  [place] MAIN [facility] SERVICES [address] [place], [place], [address] Phone: [phone]   Fax:  [phone]   Name: JANEI SCHEFF MRN: [id number] Date of Birth: 05-30-1966

## 2021-07-02 NOTE — Therapy (Signed)
[place] MAIN [facility] SERVICES [address] [place], [place], [address] Phone: [phone]   Fax:  [phone]  Occupational Therapy Treatment  [name] [name] Details  Name: [name] [name] MRN: [id number] Date of Birth: 09-06-1966 No data recorded  Encounter Date: 07/01/2021   OT End of Session - 07/02/21 2105     Visit Number 23    Number of Visits 39    Date for OT Re-Evaluation 08/21/21    Authorization Type Progress reports period starting 02/11/2021    OT Start Time 1545    OT Stop Time 1632    OT Time Calculation (min) 47 min    Activity Tolerance [name] [name] tolerated treatment well    Behavior During Therapy [facility] for tasks assessed/performed             Past Medical History:  Diagnosis Date   Anoxic brain injury ([place])    Cardiac arrest ([place])    Cognitive communication deficit    Hyperlipidemia    Hypertension    Seizures ([place])    Ventricular fibrillation ([place])     Past Surgical History:  Procedure Laterality Date   PEG PLACEMENT N/A 12/13/2020   Procedure: PERCUTANEOUS ENDOSCOPIC GASTROSTOMY (PEG) PLACEMENT;  Surgeon: Lesly Rubenstein, MD;  Location: [facility] ENDOSCOPY;  Service: Endoscopy;  Laterality: N/A;   TRACHEOSTOMY TUBE PLACEMENT N/A 12/13/2020   Procedure: TRACHEOSTOMY;  Surgeon: Clyde Canterbury, MD;  Location: [facility] ORS;  Service: ENT;  Laterality: N/A;    There were no vitals filed for this visit.   Subjective Assessment - 07/02/21 2059     Subjective  [name] [name] reports her 15 yo daughter has been cooking, basically taught herself with use of youtube along with [name] [name] teaching her skills in the past.    [name] [name] is accompanied by: Family member    Pertinent History [name] [name]. is a 55 y.o. female who went into cardiac arrest, and was admitted to [facility] on 12/02/2020. [name] [name]. was diagnosed with Anoxic Brain Injury following the Cardiac Arrest. When medically stable, the [name] [name]. was tranferred to [facility] re[facility]. The [name] [name] was discharged  home on 02/08/2021, and is ready to begin outpatient OT services. [name] [name]. was independent, and working prior to the onset. [name] [name]. Has very supportive family. [name] [name]. Enjoys gardening.    [name] [name] Stated Goals [name] [name] would like to be as independent as possible.    Currently in Pain? No/denies    Multiple Pain Sites No            [name] [name]'s preferred focus this date was working on strengthening  Therapeutic Exercise:  [name] [name] seen for UB strengthening with use of 3# dowel, shoulder flexion with overhead press, chest press, ABD/ADD, forwards and backwards circles and placing dowel in vertical position on table and climbing up dowel hand over hand , therapist demo and cues for proper form and technique. Grip strengthening with right hand for sustained gripping patterns with therapist adjusting settings and providing cues for proper hand hold and for gripping patterns. 17.9# for 20 reps  11# for 20 reps Pinch strength performed with all levels of resistive pinch pins but black level with increased difficulty, placing pins in multi directions onto small long dowel stick    Response to tx: [name] [name] requested to focus on strength this date.  She is progressing well, she responds to verbal and tactile cues.  Able to perform 10-12 reps of each exercise with weighted dowel.  Fatigued easily with grip strength with heavier setting, 2nd set decreased resistance  to complete task.  Some difficulty with most resistive level of pinch pins (black).  Continue OT towards goals in plan of care to improve RUE function for daily tasks.                  OT Education - 07/02/21 2104     Education Details strengthening, functional use of UE    Person(s) Educated [name] [name]    Methods Explanation;Demonstration    Comprehension Verbalized understanding;Returned demonstration                 OT Long Term Goals - 06/16/21 1434       OT LONG TERM GOAL #1   Title [name] [name]. will be able to independently sign her name with 100%  legibility.    Baseline [name] [name]. continues to present with limited signature. Eval: Signature not legible, however was able to sign on the line with no deviation above, or below the line.  Recert: Signature with 75% legibility. Spacing words on a line is impaired and requires extensive cueing to scan L to R across the paper. 20th visit: 75% legible printed, 50% signature.    Time 12    Period Weeks    Status On-going    Target Date 08/21/21      OT LONG TERM GOAL #2   Title [name] [name]. will increase right shoulder AROM to be able to independently retrieve items from closets, and cabinets.    Baseline [name] [name]. has improved with AROM, and is now able to retrieve items from closets, and cabinets. Eval: [name] [name]. presents with limited ROM. Recert: [name] [name] has good AROM overhead but repetitive reaching causes fatigue and need for rest breaks. 20th visit: [name] [name]. has progressed with AROM, and functional reaching.    Time 12    Period Weeks    Status Achieved      OT LONG TERM GOAL #3   Title [name] [name]. will increase right grip strength by 5# to be able hold a full glass    Baseline [name] [name]. is improving with formulating a composite fist. Eval: limited grip strength. Limited bilateral digit flexion, unable to make a full composite fist. 20th visit: [name] [name]. is making excellent progress with bilateral grip strengt. [name] [name]. is able to hold, a d drink from a full glass.    Time 12    Period Weeks    Status On-going    Target Date 08/21/21      OT LONG TERM GOAL #4   Title [name] [name]. will independently access a computer, and type a one sentence email correspondence.    Baseline 20th visit: [name] [name]. continues to have difficulty tying a sentence leggibly. [name] [name]. is able to type a one sentence correspondence with multiple mistypes. Eval: [name] [name]. is unable to to perform    Time 12    Period Weeks    Status On-going    Target Date 08/21/21      OT LONG TERM GOAL #5   Title [name] [name]. will improve bilateral [facility] skills to be able to grasp, and manipulate small objects during ADLs.     Baseline 20th visit: [name] [name]. has made excellent progress with bialteral [facility] skills, and grasping small objects. [name] [name]. continues to have difficulty grasping, and manipulating small objects. Eval: [name] [name]. presents with limited [facility] skills with difficulty picking,and manipulating small objects.    Time 12    Period Weeks    Status On-going    Target Date 08/21/21      OT LONG TERM GOAL #6   Title [name] [name].  will improve UE strength by 2 mm grades to assist with ADLs, and IADLS,    Baseline [id number] visit: BUE strength; 4+/5 overall. [name] [name]. continues to present with limited BUE stregth. [name] [name]. is engaging in more ADL,a nd IADL tasks at home. Eval: [name] [name]. has difficulty performing.    Time 12    Period Weeks    Status On-going    Target Date 08/21/21      OT LONG TERM GOAL #7   Title [name] [name]. will complete light meal preparation with minA    Baseline 06/16/2021: [name] [name]. is assisting her daughter with cooking chiscken, bacon, slamon, and grits. [name] [name]. is preparing meals with her daughter's assist. Eval: [name] [name]. is unable to prepare light meal preparation.    Time 12    Period Weeks    Target Date 08/21/21      OT LONG TERM GOAL #8   Title [name] [name]. will perform light home management tasks with minA    Baseline 06/16/2021: [name] [name]. is independent with bedmaking, vacuuming, dishes, dusting, sweeping the porch. [name] [name]. is independent with her, and har husband's laundry. [name] [name].'s daughter helps her down the outdoor steps [place] stones. [name] [name]. is engaging in more home management tasks. Eval: [name] [name]. is unable to perform    Time 12    Status On-going    Target Date 08/21/21      OT LONG TERM GOAL  #9   TITLE [name] [name] will demonstrate improved visual/spatial skills as demonstrated by ability to adequately space words in a sentence with min vc.    Baseline 20th visit: [name] [name]. requires cues, and assist for spacing when writing sentences.    Time 12    Period Weeks    Status On-going    Target Date 08/21/21                   Plan - 07/02/21 2106      Clinical Impression Statement [name] [name] requested to focus on strength this date.  She is progressing well, she responds to verbal and tactile cues.  Able to perform 10-12 reps of each exercise with weighted dowel.  Fatigued easily with grip strength with heavier setting, 2nd set decreased resistance to complete task.  Some difficulty with most resistive level of pinch pins (black).  Continue OT towards goals in plan of care to improve RUE function for daily tasks.    OT Occupational Profile and History Problem Focused Assessment - Including review of records relating to presenting problem    Occupational performance deficits (Please refer to evaluation for details): ADL's;IADL's    Body Structure / Function / Physical Skills ADL;IADL;FMC;ROM;Strength    Rehab Potential Good    Clinical Decision Making Several treatment options, min-mod task modification necessary    Comorbidities Affecting Occupational Performance: May have comorbidities impacting occupational performance    Modification or Assistance to Complete Evaluation  Min-Moderate modification of tasks or assist with assess necessary to complete eval    OT Frequency 2x / week    OT Duration 12 weeks    OT Treatment/Interventions Self-care/ADL training;Neuromuscular education;Therapeutic activities;[name] [name]/family education;DME and/or AE instruction;Visual/perceptual remediation/compensation    Consulted and Agree with Plan of Care [name] [name]             [name] [name] will benefit from skilled therapeutic intervention in order to improve the following deficits and impairments:   Body Structure / Function / Physical Skills: ADL, IADL, FMC, ROM, Strength       Visit Diagnosis: Muscle weakness (generalized)  Other lack of  coordination  Unsteadiness on feet    Problem List [name] [name] Active Problem List   Diagnosis Date Noted   Multiple lung nodules 03/27/2021   Dysarthria and anarthria 02/11/2021   Sinus tachycardia    Labile blood glucose     Dysphagia    Seizure prophylaxis    Malnutrition of moderate degree 01/23/2021   Anoxic brain injury ([place]) 01/10/2021   Prediabetes    Dyslipidemia    S/P percutaneous endoscopic gastrostomy (PEG) tube placement [facility])    Status post tracheostomy ([place])    Aspiration pneumonia of both lower lobes due to gastric secretions ([place]) 01/05/2021   Acute respiratory failure (HCC)    Hypoxic ischemic encephalopathy    Endotracheal tube present    Cardiac arrest ([place]) 12/02/2020   Essential hypertension 12/09/2011   Donavon Kimrey T Magdelyn Roebuck, OTR/L, CLT  [place] 07/03/2021, 10:51 AM  [place] [address] [place], [place], [address] Phone: [phone]   Fax:  [phone]  Name: IANTHA SCHMEER MRN: [id number] Date of Birth: [birth year]

## 2021-07-03 ENCOUNTER — Other Ambulatory Visit: Payer: Self-pay

## 2021-07-03 ENCOUNTER — Encounter: Payer: Self-pay | Admitting: Occupational Therapy

## 2021-07-03 ENCOUNTER — Ambulatory Visit: Payer: Self-pay | Admitting: Occupational Therapy

## 2021-07-03 ENCOUNTER — Ambulatory Visit: Payer: Self-pay | Admitting: Speech Pathology

## 2021-07-03 DIAGNOSIS — R41841 Cognitive communication deficit: Secondary | ICD-10-CM

## 2021-07-03 DIAGNOSIS — R278 Other lack of coordination: Secondary | ICD-10-CM

## 2021-07-03 DIAGNOSIS — G931 Anoxic brain damage, not elsewhere classified: Secondary | ICD-10-CM

## 2021-07-03 DIAGNOSIS — M6281 Muscle weakness (generalized): Secondary | ICD-10-CM

## 2021-07-03 NOTE — Therapy (Signed)
[place] MAIN [facility] SERVICES [address] [place], [place], [address] Phone: [phone]   Fax:  [phone]  Occupational Therapy Treatment  Patient Details  Name: [name] [name] MRN: [id number] Date of Birth: February 03, 1966 No data recorded  Encounter Date: 07/03/2021   OT End of Session - 07/03/21 1416     Visit Number 24    Number of Visits 39    Date for OT Re-Evaluation 08/21/21    Authorization Type Progress reports period starting 02/11/2021    OT Start Time 1400    OT Stop Time 1445    OT Time Calculation (min) 45 min    Activity Tolerance Patient tolerated treatment well    Behavior During Therapy [facility] for tasks assessed/performed             Past Medical History:  Diagnosis Date   Anoxic brain injury ([place])    Cardiac arrest ([place])    Cognitive communication deficit    Hyperlipidemia    Hypertension    Seizures ([place])    Ventricular fibrillation ([place])     Past Surgical History:  Procedure Laterality Date   PEG PLACEMENT N/A 12/13/2020   Procedure: PERCUTANEOUS ENDOSCOPIC GASTROSTOMY (PEG) PLACEMENT;  Surgeon: Lesly Rubenstein, MD;  Location: [facility] ENDOSCOPY;  Service: Endoscopy;  Laterality: N/A;   TRACHEOSTOMY TUBE PLACEMENT N/A 12/13/2020   Procedure: TRACHEOSTOMY;  Surgeon: Clyde Canterbury, MD;  Location: [facility] ORS;  Service: ENT;  Laterality: N/A;    There were no vitals filed for this visit.   Subjective Assessment - 07/03/21 1415     Subjective  [name] reports having had a picnic in her back yard.    Patient is accompanied by: Family member    Pertinent History Pt. is a 55 y.o. female who went into cardiac arrest, and was admitted to [facility] on 12/02/2020. Pt. was diagnosed with Anoxic Brain Injury following the Cardiac Arrest. When medically stable, the pt. was tranferred to [place] rehab. The patient was discharged home on 02/08/2021, and is ready to begin outpatient OT services. Pt. was independent, and  working prior to the onset. Pt. Has very supportive family. Pt. Enjoys gardening.    Currently in Pain? No/denies            OT TREATMENT    Therapeutic Exercise:  Pt. Worked on BUE strengthening, and reciprocal motion using the UBE in standing for 10 min. with no resistance. Constant monitoring was provided. Pt. performed 2# dowel ex. For UE strengthening secondary to weakness. Bilateral shoulder flexion, chest press, circular patterns, and elbow flexion/extension were performed. 2# dumbbell ex. for elbow flexion and extension,  2# for forearm supination/pronation, wrist flexion/extension, and radial deviation. Pt. requires rest breaks and verbal cues for proper technique. Pt. performed gross gripping with a gross grip strengthener. Pt. worked on sustaining grip while grasping pegs and reaching at various heights. The gripper was set to 17.9# of grip strength resistance for the first rep, and 11.2# of grip strength force for the second.   Pt. is making excellent progress overall. Pt. requires cues and assist for performing BUE strengthening. Pt. required cues for positioning while standing at the UBE. Pt. required cues for proper there. Ex. form and technique with the dowel, and dumb bell exercises. Pt. continues to work on improving, and maximizing BUE strength in preparation for ADLs, and IADL functioning.  OT Education - 07/03/21 1416     Education Details strengthening, functional use of UE    Person(s) Educated Patient    Methods Explanation;Demonstration    Comprehension Verbalized understanding;Returned demonstration                 OT Long Term Goals - 06/16/21 1434       OT LONG TERM GOAL #1   Title Pt. will be able to independently sign her name with 100% legibility.    Baseline Pt. continues to present with limited signature. Eval: Signature not legible, however was able to sign on the line with no deviation above, or below the line.   Recert: Signature with 75% legibility. Spacing words on a line is impaired and requires extensive cueing to scan L to R across the paper. 20th visit: 75% legible printed, 50% signature.    Time 12    Period Weeks    Status On-going    Target Date 08/21/21      OT LONG TERM GOAL #2   Title Pt. will increase right shoulder AROM to be able to independently retrieve items from closets, and cabinets.    Baseline Pt. has improved with AROM, and is now able to retrieve items from closets, and cabinets. Eval: Pt. presents with limited ROM. Recert: Pt has good AROM overhead but repetitive reaching causes fatigue and need for rest breaks. 20th visit: pt. has progressed with AROM, and functional reaching.    Time 12    Period Weeks    Status Achieved      OT LONG TERM GOAL #3   Title Pt. will increase right grip strength by 5# to be able hold a full glass    Baseline Pt. is improving with formulating a composite fist. Eval: limited grip strength. Limited bilateral digit flexion, unable to make a full composite fist. 20th visit: Pt. is making excellent progress with bilateral grip strengt. Pt. is able to hold, a d drink from a full glass.    Time 12    Period Weeks    Status On-going    Target Date 08/21/21      OT LONG TERM GOAL #4   Title Pt. will independently access a computer, and type a one sentence email correspondence.    Baseline 20th visit: Pt. continues to have difficulty tying a sentence leggibly. Pt. is able to type a one sentence correspondence with multiple mistypes. Eval: Pt. is unable to to perform    Time 12    Period Weeks    Status On-going    Target Date 08/21/21      OT LONG TERM GOAL #5   Title Pt. will improve bilateral [facility] skills to be able to grasp, and manipulate small objects during ADLs.    Baseline 20th visit: Pt. has made excellent progress with bialteral [facility] skills, and grasping small objects. Pt. continues to have difficulty grasping, and manipulating small  objects. Eval: Pt. presents with limited [facility] skills with difficulty picking,and manipulating small objects.    Time 12    Period Weeks    Status On-going    Target Date 08/21/21      OT LONG TERM GOAL #6   Title Pt. will improve UE strength by 2 mm grades to assist with ADLs, and IADLS,    Baseline [id number] visit: BUE strength; 4+/5 overall. Pt. continues to present with limited BUE stregth. Pt. is engaging in more ADL,a nd IADL tasks at home. Eval: pt. has difficulty  performing.    Time 12    Period Weeks    Status On-going    Target Date 08/21/21      OT LONG TERM GOAL #7   Title Pt. will complete light meal preparation with minA    Baseline 06/16/2021: Pt. is assisting her daughter with cooking chiscken, bacon, slamon, and grits. Pt. is preparing meals with her daughter's assist. Eval: Pt. is unable to prepare light meal preparation.    Time 12    Period Weeks    Target Date 08/21/21      OT LONG TERM GOAL #8   Title Pt. will perform light home management tasks with minA    Baseline 06/16/2021: Pt. is independent with bedmaking, vacuuming, dishes, dusting, sweeping the porch. Pt. is independent with her, and har husband's laundry. Pt.'s daughter helps her down the outdoor steps be[place] of the stones. Pt. is engaging in more home management tasks. Eval: pt. is unable to perform    Time 12    Status On-going    Target Date 08/21/21      OT LONG TERM GOAL  #9   TITLE Pt will demonstrate improved visual/spatial skills as demonstrated by ability to adequately space words in a sentence with min vc.    Baseline 20th visit: Pt. requires cues, and assist for spacing when writing sentences.    Time 12    Period Weeks    Status On-going    Target Date 08/21/21                   Plan - 07/03/21 1417     Clinical Impression Statement Pt. is making excellent progress overall. Pt. requires cues and assist for performing BUE strengthening. Pt. required cues for positioning while  standing at the UBE. Pt. required cues for proper there. Ex. form and technique with the dowel, and dumb bell exercises. Pt. continues to work on improving, and maximizing BUE strength in preparation for ADLs, and IADL functioning.     OT Occupational Profile and History Problem Focused Assessment - Including review of records relating to presenting problem    Occupational performance deficits (Please refer to evaluation for details): ADL's;IADL's    Body Structure / Function / Physical Skills ADL;IADL;FMC;ROM;Strength    Rehab Potential Good    Clinical Decision Making Several treatment options, min-mod task modification necessary    Comorbidities Affecting Occupational Performance: May have comorbidities impacting occupational performance    Modification or Assistance to Complete Evaluation  Min-Moderate modification of tasks or assist with assess necessary to complete eval    OT Frequency 2x / week    OT Duration 12 weeks    OT Treatment/Interventions Self-care/ADL training;Neuromuscular education;Therapeutic activities;Patient/family education;DME and/or AE instruction;Visual/perceptual remediation/compensation    Consulted and Agree with Plan of Care Patient             Patient will benefit from skilled therapeutic intervention in order to improve the following deficits and impairments:   Body Structure / Function / Physical Skills: ADL, IADL, FMC, ROM, Strength       Visit Diagnosis: Muscle weakness (generalized)  Other lack of coordination    Problem List Patient Active Problem List   Diagnosis Date Noted   Multiple lung nodules 03/27/2021   Dysarthria and anarthria 02/11/2021   Sinus tachycardia    Labile blood glucose    Dysphagia    Seizure prophylaxis    Malnutrition of moderate degree 01/23/2021   Anoxic brain injury ([place]) 01/10/2021  Prediabetes    Dyslipidemia    S/P percutaneous endoscopic gastrostomy (PEG) tube placement [facility])    Status post tracheostomy  ([place])    Aspiration pneumonia of both lower lobes due to gastric secretions ([place]) 01/05/2021   Acute respiratory failure ([place])    Hypoxic ischemic encephalopathy    Endotracheal tube present    Cardiac arrest ([place]) 12/02/2020   Essential hypertension 12/09/2011    Harrel Carina, MS, OTR/L 07/03/2021, 2:22 PM  [place] [address] [place], [place], [address] Phone: [phone]   Fax:  [phone]  Name: KANIJA ROMAIN MRN: [id number] Date of Birth: 27-Jun-1966

## 2021-07-05 NOTE — Therapy (Signed)
[place] MAIN [facility] SERVICES [address] [place], [place], [address] Phone: [phone]   Fax:  [phone]  Speech Language Pathology Treatment  Patient Details  Name: [name] [name] MRN: [id number] Date of Birth: 06/25/1966 Referring Provider (SLP): [place]e [place], [place]   Encounter Date: 07/03/2021   End of Session - 07/05/21 1501     Visit Number 31    Number of Visits 50    Date for SLP Re-Evaluation 08/21/21    Authorization Type Self Pay    Authorization Time Period 05/28/2021 thru 08/21/2021    Authorization - Visit Number 1    Progress Note Due on Visit 10    SLP Start Time 1300    SLP Stop Time  1400    SLP Time Calculation (min) 60 min    Activity Tolerance Patient tolerated treatment well             Past Medical History:  Diagnosis Date   Anoxic brain injury ([place])    Cardiac arrest ([place])    Cognitive communication deficit    Hyperlipidemia    Hypertension    Seizures ([place])    Ventricular fibrillation ([place])     Past Surgical History:  Procedure Laterality Date   PEG PLACEMENT N/A 12/13/2020   Procedure: PERCUTANEOUS ENDOSCOPIC GASTROSTOMY (PEG) PLACEMENT;  Surgeon: Lesly Rubenstein, MD;  Location: [facility] ENDOSCOPY;  Service: Endoscopy;  Laterality: N/A;   TRACHEOSTOMY TUBE PLACEMENT N/A 12/13/2020   Procedure: TRACHEOSTOMY;  Surgeon: Clyde Canterbury, MD;  Location: [facility] ORS;  Service: ENT;  Laterality: N/A;    There were no vitals filed for this visit.   Subjective Assessment - 07/05/21 1500     Subjective "I have been thinking about starting to drive"    Currently in Pain? No/denies                   ADULT SLP TREATMENT - 07/05/21 0001       Treatment Provided   Treatment provided Cognitive-Linquistic      Cognitive-Linquistic Treatment   Treatment focused on Cognition    Skilled Treatment COGNITION: maximal faded to moderate assistance problem solving semi-complex reasoning app on iPad               SLP Education - 07/05/21 1501     Education Details provided    [employer]) Educated Patient    Methods Explanation;Demonstration    Comprehension Need further instruction              SLP Short Term Goals - 05/29/21 1536       SLP SHORT TERM GOAL #1   Title Pt will independently demonstrate selective attention in mildly distracting environment for 15 minutes.    Status Achieved      SLP SHORT TERM GOAL #2   Title With moderate cues, pt will utilize speech intelligibility strategies to achieve > 90% intelligibility at the sentence level.    Baseline Goal met, revised    Time 10    Period --   sessions   Status Revised      SLP SHORT TERM GOAL #3   Title With minimal cues, pt will utilize compensatory memory strategies to recall therapy sessions for week as well as order of therapy in 8 out of 10 sessions.    Baseline goal met, goal upgraded    Time 10    Period --   sessions   Status Revised      SLP SHORT  TERM GOAL #4   Title With moderate assistance, pt will complete basic problem solving tasks with >95% accuracy.    Time 10    Period --   sessions   Status On-going              SLP Long Term Goals - 05/29/21 1538       SLP LONG TERM GOAL #2   Title Pt will independently complete functional problem solving for basic and familiar tasks.    Time 12    Period Weeks    Status On-going    Target Date 08/21/21      SLP LONG TERM GOAL #3   Title Pt will demonstrate selective attention in a moderately distracting environment for 15 minutes with < 1 redirection to task.    Time 12    Period Weeks    Status On-going    Target Date 08/21/21      SLP LONG TERM GOAL #4   Title With minimal cues, pt will increase speech intelligibility to > 75% at the conversation level.    Baseline goal met, goal updated    Time 12    Period Weeks    Status Revised    Target Date 08/21/21              Plan - 07/05/21 1502     Clinical Impression  Statement Pt arrived to session and began describing her desire to drive again. As such, SLP introduced a map navigating app on [place]. Given pt's visual-perceptual deficits, she was not able to read sign names or look for intersections on app. Another app was introduced targeted semi-complex problem solving. This app involved strategizing how to clear a graph of it's pieces. Pt required maximal assistance for sequencial thought processing as well as impulsivity. Pt continues to express desire to live independently and return to work, driving and community activities. As such, skilled ST services are required to target her moderate cognitive communication deficits.    Speech Therapy Frequency 2x / week    Duration 12 weeks    Treatment/Interventions Cognitive reorganization;Compensatory techniques;Internal/external aids;SLP instruction and feedback;Patient/family education;Functional tasks;Compensatory strategies    Potential to Achieve Goals Good    Potential Considerations Severity of impairments;Ability to learn/carryover information    Consulted and Agree with Plan of Care Patient             Patient will benefit from skilled therapeutic intervention in order to improve the following deficits and impairments:   Cognitive communication deficit  Anoxic brain injury [facility])    Problem List Patient Active Problem List   Diagnosis Date Noted   Multiple lung nodules 03/27/2021   Dysarthria and anarthria 02/11/2021   Sinus tachycardia    Labile blood glucose    Dysphagia    Seizure prophylaxis    Malnutrition of moderate degree 01/23/2021   Anoxic brain injury ([place]) 01/10/2021   Prediabetes    Dyslipidemia    S/P percutaneous endoscopic gastrostomy (PEG) tube placement [facility])    Status post tracheostomy ([place])    Aspiration pneumonia of both lower lobes due to gastric secretions ([place]) 01/05/2021   Acute respiratory failure ([facility])    Hypoxic ischemic encephalopathy     Endotracheal tube present    Cardiac arrest ([place]) 12/02/2020   Essential hypertension 12/09/2011   Natividad Halls B. Rutherford Nail M.S., CCC-SLP, [place] Pathologist Rehabilitation Services Office [phone]  Stormy Fabian 07/05/2021, 3:06 PM  [place] MAIN [facility] SERVICES [address]  [place], [place], [address] Phone: [phone]   Fax:  [phone]   Name: SHANAYA SCHNECK MRN: [id number] Date of Birth: 02-02-1966

## 2021-07-07 ENCOUNTER — Encounter: Payer: Self-pay | Admitting: *Deleted

## 2021-07-08 ENCOUNTER — Ambulatory Visit: Payer: Self-pay | Admitting: Occupational Therapy

## 2021-07-08 ENCOUNTER — Other Ambulatory Visit: Payer: Self-pay

## 2021-07-08 ENCOUNTER — Ambulatory Visit: Payer: Self-pay | Admitting: Speech Pathology

## 2021-07-08 DIAGNOSIS — R41841 Cognitive communication deficit: Secondary | ICD-10-CM

## 2021-07-08 DIAGNOSIS — M6281 Muscle weakness (generalized): Secondary | ICD-10-CM

## 2021-07-08 DIAGNOSIS — G931 Anoxic brain damage, not elsewhere classified: Secondary | ICD-10-CM

## 2021-07-08 DIAGNOSIS — R278 Other lack of coordination: Secondary | ICD-10-CM

## 2021-07-08 NOTE — Therapy (Signed)
[place] MAIN [facility] SERVICES [address] [place], [place], [address] Phone: [phone]   Fax:  [phone]  Occupational Therapy Treatment  Patient Details  Name: [name] [name] MRN: [id number] Date of Birth: [birth year] No data recorded  Encounter Date: 07/08/2021   OT End of Session - 07/08/21 1636     Visit Number 25    Number of Visits 39    Date for OT Re-Evaluation 08/21/21    Authorization Type Progress reports period starting 02/11/2021    OT Start Time 1620    OT Stop Time 1700    OT Time Calculation (min) 40 min    Activity Tolerance Patient tolerated treatment well    Behavior During Therapy [facility] for tasks assessed/performed             Past Medical History:  Diagnosis Date   Anoxic brain injury ([place])    Cardiac arrest ([place])    Cognitive communication deficit    Hyperlipidemia    Hypertension    Seizures ([place])    Ventricular fibrillation ([place])     Past Surgical History:  Procedure Laterality Date   PEG PLACEMENT N/A 12/13/2020   Procedure: PERCUTANEOUS ENDOSCOPIC GASTROSTOMY (PEG) PLACEMENT;  Surgeon: Lesly Rubenstein, MD;  Location: [facility] ENDOSCOPY;  Service: Endoscopy;  Laterality: N/A;   TRACHEOSTOMY TUBE PLACEMENT N/A 12/13/2020   Procedure: TRACHEOSTOMY;  Surgeon: Clyde Canterbury, MD;  Location: [facility] ORS;  Service: ENT;  Laterality: N/A;    There were no vitals filed for this visit.   Subjective Assessment - 07/08/21 1634     Subjective  [name] reports having had a picnic in her back yard.    Patient is accompanied by: Family member    Pertinent History Pt. is a 55 y.o. female who went into cardiac arrest, and was admitted to [facility] on 12/02/2020. Pt. was diagnosed with Anoxic Brain Injury following the Cardiac Arrest. When medically stable, the pt. was tranferred to [facility]. The patient was discharged home on 02/08/2021, and is ready to begin outpatient OT services. Pt. was independent, and  working prior to the onset. Pt. Has very supportive family. Pt. Enjoys gardening.    Currently in Pain? No/denies             OT TREATMENT     Therapeutic Exercise:   Pt. worked on the [employer] for 8 min. With constant monitoring of the BUEs. Pt. worked on  level 4.5 without difficulty. No rest breaks were required. Pt. performed 2# dowel ex. For UE strengthening secondary to weakness. Bilateral shoulder flexion, chest press, circular patterns, and elbow flexion/extension were performed.  2# dumbbell ex. for elbow flexion and extension, 2# for forearm supination/pronation, wrist flexion/extension, and radial deviation. Pt. requires rest breaks and verbal cues for proper technique. Pt. performed gross gripping with a gross grip strengthener. Pt. worked on sustaining grip while grasping pegs and reaching at various heights. The gripper was set to 17.9# of grip strength resistance.   Pt. Continues to make excellent progress overall. Pt. was able to tolerate increased weight to 2.5# dowel. Pt. requires cues and assist for performing BUE strengthening. Pt. required cues for positioning while standing at the UBE. Pt. required verbal cues, and cues for visual demonstration for proper there. Ex. form and technique with the dowel, and dumb bell exercises. Pt. continues to work on improving, and maximizing BUE strength in preparation for ADLs, and IADL functioning.  OT Education - 07/08/21 1636     Education Details strengthening, functional use of UE    Person(s) Educated Patient    Methods Explanation;Demonstration    Comprehension Verbalized understanding;Returned demonstration                 OT Long Term Goals - 06/16/21 1434       OT LONG TERM GOAL #1   Title Pt. will be able to independently sign her name with 100% legibility.    Baseline Pt. continues to present with limited signature. Eval: Signature not legible, however was able to  sign on the line with no deviation above, or below the line.  Recert: Signature with 75% legibility. Spacing words on a line is impaired and requires extensive cueing to scan L to R across the paper. 20th visit: 75% legible printed, 50% signature.    Time 12    Period Weeks    Status On-going    Target Date 08/21/21      OT LONG TERM GOAL #2   Title Pt. will increase right shoulder AROM to be able to independently retrieve items from closets, and cabinets.    Baseline Pt. has improved with AROM, and is now able to retrieve items from closets, and cabinets. Eval: Pt. presents with limited ROM. Recert: Pt has good AROM overhead but repetitive reaching causes fatigue and need for rest breaks. 20th visit: pt. has progressed with AROM, and functional reaching.    Time 12    Period Weeks    Status Achieved      OT LONG TERM GOAL #3   Title Pt. will increase right grip strength by 5# to be able hold a full glass    Baseline Pt. is improving with formulating a composite fist. Eval: limited grip strength. Limited bilateral digit flexion, unable to make a full composite fist. 20th visit: Pt. is making excellent progress with bilateral grip strengt. Pt. is able to hold, a d drink from a full glass.    Time 12    Period Weeks    Status On-going    Target Date 08/21/21      OT LONG TERM GOAL #4   Title Pt. will independently access a computer, and type a one sentence email correspondence.    Baseline 20th visit: Pt. continues to have difficulty tying a sentence leggibly. Pt. is able to type a one sentence correspondence with multiple mistypes. Eval: Pt. is unable to to perform    Time 12    Period Weeks    Status On-going    Target Date 08/21/21      OT LONG TERM GOAL #5   Title Pt. will improve bilateral [facility] skills to be able to grasp, and manipulate small objects during ADLs.    Baseline 20th visit: Pt. has made excellent progress with bialteral [facility] skills, and grasping small objects. Pt.  continues to have difficulty grasping, and manipulating small objects. Eval: Pt. presents with limited [facility] skills with difficulty picking,and manipulating small objects.    Time 12    Period Weeks    Status On-going    Target Date 08/21/21      OT LONG TERM GOAL #6   Title Pt. will improve UE strength by 2 mm grades to assist with ADLs, and IADLS,    Baseline [id number] visit: BUE strength; 4+/5 overall. Pt. continues to present with limited BUE stregth. Pt. is engaging in more ADL,a nd IADL tasks at home. Eval: pt. has difficulty  performing.    Time 12    Period Weeks    Status On-going    Target Date 08/21/21      OT LONG TERM GOAL #7   Title Pt. will complete light meal preparation with minA    Baseline 06/16/2021: Pt. is assisting her daughter with cooking chiscken, bacon, slamon, and grits. Pt. is preparing meals with her daughter's assist. Eval: Pt. is unable to prepare light meal preparation.    Time 12    Period Weeks    Target Date 08/21/21      OT LONG TERM GOAL #8   Title Pt. will perform light home management tasks with minA    Baseline 06/16/2021: Pt. is independent with bedmaking, vacuuming, dishes, dusting, sweeping the porch. Pt. is independent with her, and har husband's laundry. Pt.'s daughter helps her down the outdoor steps [place] of the stones. Pt. is engaging in more home management tasks. Eval: pt. is unable to perform    Time 12    Status On-going    Target Date 08/21/21      OT LONG TERM GOAL  #9   TITLE Pt will demonstrate improved visual/spatial skills as demonstrated by ability to adequately space words in a sentence with min vc.    Baseline 20th visit: Pt. requires cues, and assist for spacing when writing sentences.    Time 12    Period Weeks    Status On-going    Target Date 08/21/21                   Plan - 07/08/21 1637     Clinical Impression Statement Pt. Continues to make excellent progress overall. Pt. was able to tolerate increased  weight to 2.5# dowel. Pt. requires cues and assist for performing BUE strengthening. Pt. required cues for positioning while standing at the UBE. Pt. required verbal cues, and cues for visual demonstration for proper there. Ex. form and technique with the dowel, and dumb bell exercises. Pt. continues to work on improving, and maximizing BUE strength in preparation for ADLs, and IADL functioning.     OT Occupational Profile and History Problem Focused Assessment - Including review of records relating to presenting problem    Occupational performance deficits (Please refer to evaluation for details): ADL's;IADL's    Body Structure / Function / Physical Skills ADL;IADL;FMC;ROM;Strength    Rehab Potential Good    Clinical Decision Making Several treatment options, min-mod task modification necessary    Comorbidities Affecting Occupational Performance: May have comorbidities impacting occupational performance    Modification or Assistance to Complete Evaluation  Min-Moderate modification of tasks or assist with assess necessary to complete eval    OT Frequency 2x / week    OT Duration 12 weeks    OT Treatment/Interventions Self-care/ADL training;Neuromuscular education;Therapeutic activities;Patient/family education;DME and/or AE instruction;Visual/perceptual remediation/compensation    Consulted and Agree with Plan of Care Patient             Patient will benefit from skilled therapeutic intervention in order to improve the following deficits and impairments:   Body Structure / Function / Physical Skills: ADL, IADL, FMC, ROM, Strength       Visit Diagnosis: Muscle weakness (generalized)  Other lack of coordination    Problem List Patient Active Problem List   Diagnosis Date Noted   Multiple lung nodules 03/27/2021   Dysarthria and anarthria 02/11/2021   Sinus tachycardia    Labile blood glucose    Dysphagia    Seizure  prophylaxis    Malnutrition of moderate degree 01/23/2021    Anoxic brain injury ([place]) 01/10/2021   Prediabetes    Dyslipidemia    S/P percutaneous endoscopic gastrostomy (PEG) tube placement [facility])    Status post tracheostomy ([place])    Aspiration pneumonia of both lower lobes due to gastric secretions ([place]) 01/05/2021   Acute respiratory failure ([place])    Hypoxic ischemic encephalopathy    Endotracheal tube present    Cardiac arrest ([place]) 12/02/2020   Essential hypertension 12/09/2011    Harrel Carina, MS, OTR/L 07/08/2021, 4:41 PM  [place] MAIN [facility] SERVICES [address] [place], [place], [address] Phone: [phone]   Fax:  [phone]  Name: [name] [name] MRN: [id number] Date of Birth: [birth year]

## 2021-07-09 NOTE — Therapy (Signed)
[place] MAIN [facility] SERVICES [address] [place], [place], [address] Phone: [phone]   Fax:  [phone]  Speech Language Pathology Treatment  Patient Details  Name: [name] [name] MRN: [id number] Date of Birth: 10/30/1966 Referring Provider (SLP): Mar[place]e [place], [place]   Encounter Date: 07/08/2021   End of Session - 07/09/21 1515     Visit Number 32    Number of Visits 98    Date for SLP Re-Evaluation 08/21/21    Authorization Type Self Pay    Authorization Time Period 05/28/2021 thru 08/21/2021    Authorization - Visit Number 2    Progress Note Due on Visit 10    SLP Start Time 1520    SLP Stop Time  4944    SLP Time Calculation (min) 55 min    Activity Tolerance Patient tolerated treatment well             Past Medical History:  Diagnosis Date   Anoxic brain injury ([place])    Cardiac arrest ([place])    Cognitive communication deficit    Hyperlipidemia    Hypertension    Seizures ([place])    Ventricular fibrillation ([place])     Past Surgical History:  Procedure Laterality Date   PEG PLACEMENT N/A 12/13/2020   Procedure: PERCUTANEOUS ENDOSCOPIC GASTROSTOMY (PEG) PLACEMENT;  Surgeon: Lesly Rubenstein, MD;  Location: [facility] ENDOSCOPY;  Service: Endoscopy;  Laterality: N/A;   TRACHEOSTOMY TUBE PLACEMENT N/A 12/13/2020   Procedure: TRACHEOSTOMY;  Surgeon: Clyde Canterbury, MD;  Location: [facility] ORS;  Service: ENT;  Laterality: N/A;    There were no vitals filed for this visit.   Subjective Assessment - 07/09/21 1504     Subjective "I have signed up for driving school"    Currently in Pain? No/denies                   ADULT SLP TREATMENT - 07/09/21 0001       Treatment Provided   Treatment provided Cognitive-Linquistic      Cognitive-Linquistic Treatment   Treatment focused on Cognition    Skilled Treatment COGNITION: moderate assistance problem solving semi-complex reasoning app on iPad              SLP  Education - 07/09/21 1515     Education Details provided    [employer]) Educated Patient    Methods Explanation;Demonstration    Comprehension Need further instruction              SLP Short Term Goals - 05/29/21 1536       SLP SHORT TERM GOAL #1   Title Pt will independently demonstrate selective attention in mildly distracting environment for 15 minutes.    Status Achieved      SLP SHORT TERM GOAL #2   Title With moderate cues, pt will utilize speech intelligibility strategies to achieve > 90% intelligibility at the sentence level.    Baseline Goal met, revised    Time 10    Period --   sessions   Status Revised      SLP SHORT TERM GOAL #3   Title With minimal cues, pt will utilize compensatory memory strategies to recall therapy sessions for week as well as order of therapy in 8 out of 10 sessions.    Baseline goal met, goal upgraded    Time 10    Period --   sessions   Status Revised      SLP SHORT TERM GOAL #4  Title With moderate assistance, pt will complete basic problem solving tasks with >95% accuracy.    Time 10    Period --   sessions   Status On-going              SLP Long Term Goals - 05/29/21 1538       SLP LONG TERM GOAL #2   Title Pt will independently complete functional problem solving for basic and familiar tasks.    Time 12    Period Weeks    Status On-going    Target Date 08/21/21      SLP LONG TERM GOAL #3   Title Pt will demonstrate selective attention in a moderately distracting environment for 15 minutes with < 1 redirection to task.    Time 12    Period Weeks    Status On-going    Target Date 08/21/21      SLP LONG TERM GOAL #4   Title With minimal cues, pt will increase speech intelligibility to > 75% at the conversation level.    Baseline goal met, goal updated    Time 12    Period Weeks    Status Revised    Target Date 08/21/21              Plan - 07/09/21 1516     Clinical Impression Statement Pt arrived to  session and stated that she is registered for driving school in October. She reprots that she and her daughter have been reading the information and studying, although the studying is very difficulty and she keeps failing the pre-tests.During today's session pt demonstrated improved impulsivity and sequenctial problem solving. As such she required moderate assistance. Pt continues to express desire to live independently and return to work, driving and community activities. As such, skilled ST services are required to target her moderate cognitive communication deficits.    Speech Therapy Frequency 3x / week    Duration 12 weeks    Treatment/Interventions Cognitive reorganization;Compensatory techniques;Internal/external aids;SLP instruction and feedback;Patient/family education;Functional tasks;Compensatory strategies    Potential to Achieve Goals Good    Potential Considerations Severity of impairments;Ability to learn/carryover information    Consulted and Agree with Plan of Care Patient             Patient will benefit from skilled therapeutic intervention in order to improve the following deficits and impairments:   Cognitive communication deficit  Anoxic brain injury [facility])    Problem List Patient Active Problem List   Diagnosis Date Noted   Multiple lung nodules 03/27/2021   Dysarthria and anarthria 02/11/2021   Sinus tachycardia    Labile blood glucose    Dysphagia    Seizure prophylaxis    Malnutrition of moderate degree 01/23/2021   Anoxic brain injury ([place]) 01/10/2021   Prediabetes    Dyslipidemia    S/P percutaneous endoscopic gastrostomy (PEG) tube placement [facility])    Status post tracheostomy ([place])    Aspiration pneumonia of both lower lobes due to gastric secretions ([place]) 01/05/2021   Acute respiratory failure ([facility])    Hypoxic ischemic encephalopathy    Endotracheal tube present    Cardiac arrest ([place]) 12/02/2020   Essential hypertension 12/09/2011   Tranell Wojtkiewicz B.  Rutherford Nail M.S., CCC-SLP, [place] Pathologist Rehabilitation Services Office [phone]  Stormy Fabian 07/09/2021, 3:19 PM  [place] MAIN [facility] SERVICES [address] [place], [place], [address] Phone: [phone]   Fax:  [phone]   Name: [name] [name] MRN: [id number]  Date of Birth: 1966/01/25

## 2021-07-10 ENCOUNTER — Ambulatory Visit: Payer: Self-pay | Admitting: Speech Pathology

## 2021-07-10 ENCOUNTER — Other Ambulatory Visit: Payer: Self-pay

## 2021-07-10 ENCOUNTER — Ambulatory Visit: Payer: Self-pay | Admitting: Occupational Therapy

## 2021-07-10 DIAGNOSIS — R41841 Cognitive communication deficit: Secondary | ICD-10-CM

## 2021-07-10 DIAGNOSIS — M6281 Muscle weakness (generalized): Secondary | ICD-10-CM

## 2021-07-10 DIAGNOSIS — G931 Anoxic brain damage, not elsewhere classified: Secondary | ICD-10-CM

## 2021-07-10 DIAGNOSIS — R278 Other lack of coordination: Secondary | ICD-10-CM

## 2021-07-10 NOTE — Therapy (Signed)
[place] MAIN [facility] SERVICES [address] [place], [place], [address] Phone: [phone]   Fax:  [phone]  Occupational Therapy Treatment  Patient Details  Name: [name] [name] MRN: [id number] Date of Birth: 1966-04-26 No data recorded  Encounter Date: 07/10/2021   OT End of Session - 07/10/21 1421     Visit Number 26    Number of Visits 39    Date for OT Re-Evaluation 08/21/21    Authorization Type Progress reports period starting 02/11/2021    OT Start Time 1400    OT Stop Time 1445    OT Time Calculation (min) 45 min    Activity Tolerance Patient tolerated treatment well    Behavior During Therapy [facility] for tasks assessed/performed             Past Medical History:  Diagnosis Date   Anoxic brain injury ([place])    Cardiac arrest ([place])    Cognitive communication deficit    Hyperlipidemia    Hypertension    Seizures ([place])    Ventricular fibrillation ([place])     Past Surgical History:  Procedure Laterality Date   PEG PLACEMENT N/A 12/13/2020   Procedure: PERCUTANEOUS ENDOSCOPIC GASTROSTOMY (PEG) PLACEMENT;  Surgeon: Lesly Rubenstein, MD;  Location: [facility] ENDOSCOPY;  Service: Endoscopy;  Laterality: N/A;   TRACHEOSTOMY TUBE PLACEMENT N/A 12/13/2020   Procedure: TRACHEOSTOMY;  Surgeon: Clyde Canterbury, MD;  Location: [facility] ORS;  Service: ENT;  Laterality: N/A;    There were no vitals filed for this visit.   Subjective Assessment - 07/10/21 1420     Subjective  Pt. reports that she would like to drive again, and has an appointment with [facility] in October.    Patient is accompanied by: Family member    Pertinent History Pt. is a 55 y.o. female who went into cardiac arrest, and was admitted to [facility] on 12/02/2020. Pt. was diagnosed with Anoxic Brain Injury following the Cardiac Arrest. When medically stable, the pt. was tranferred to [facility] re[facility]. The patient was discharged home on 02/08/2021, and is ready to  begin outpatient OT services. Pt. was independent, and working prior to the onset. Pt. Has very supportive family. Pt. Enjoys gardening.    Currently in Pain? No/denies            OT TREATMENT    Therapeutic Exercise:  Pt. worked on the [employer] for 8 min. On level 4.5 for UE strengthening, with constant monitoring of the BUEs.   Therapeutic Activities:  Pt. worked on aligning Care[employer] information officer. Pt. Initially attempted to use tweezers to place the tetra pieces, however had difficulty turning them to place them on the board.   Pt. reports that she was scheduled with [facility] for testing in September, however had to move it back a month to October because she is not ready. Pt. Tolerated the [employer] for 8 min. on level 4.5. Pt. required increased verbal, and visual cues to turn the tetra pieces in the appropriate direction to fit, and follow the exact design pattern. Pt. Had difficulty problem solving through placement of the final piece in the correct direction. However, was able to place the final piece with cues, and increased time.                         OT Education - 07/10/21 1421     Education Details strengthening, functional use of UE  Person(s) Educated Patient    Methods Explanation;Demonstration    Comprehension Verbalized understanding;Returned demonstration                 OT Long Term Goals - 06/16/21 1434       OT LONG TERM GOAL #1   Title Pt. will be able to independently sign her name with 100% legibility.    Baseline Pt. continues to present with limited signature. Eval: Signature not legible, however was able to sign on the line with no deviation above, or below the line.  Recert: Signature with 75% legibility. Spacing words on a line is impaired and requires extensive cueing to scan L to R across the paper. 20th visit: 75% legible printed, 50% signature.    Time 12    Period Weeks    Status  On-going    Target Date 08/21/21      OT LONG TERM GOAL #2   Title Pt. will increase right shoulder AROM to be able to independently retrieve items from closets, and cabinets.    Baseline Pt. has improved with AROM, and is now able to retrieve items from closets, and cabinets. Eval: Pt. presents with limited ROM. Recert: Pt has good AROM overhead but repetitive reaching causes fatigue and need for rest breaks. 20th visit: pt. has progressed with AROM, and functional reaching.    Time 12    Period Weeks    Status Achieved      OT LONG TERM GOAL #3   Title Pt. will increase right grip strength by 5# to be able hold a full glass    Baseline Pt. is improving with formulating a composite fist. Eval: limited grip strength. Limited bilateral digit flexion, unable to make a full composite fist. 20th visit: Pt. is making excellent progress with bilateral grip strengt. Pt. is able to hold, a d drink from a full glass.    Time 12    Period Weeks    Status On-going    Target Date 08/21/21      OT LONG TERM GOAL #4   Title Pt. will independently access a computer, and type a one sentence email correspondence.    Baseline 20th visit: Pt. continues to have difficulty tying a sentence leggibly. Pt. is able to type a one sentence correspondence with multiple mistypes. Eval: Pt. is unable to to perform    Time 12    Period Weeks    Status On-going    Target Date 08/21/21      OT LONG TERM GOAL #5   Title Pt. will improve bilateral [facility] skills to be able to grasp, and manipulate small objects during ADLs.    Baseline 20th visit: Pt. has made excellent progress with bialteral [facility] skills, and grasping small objects. Pt. continues to have difficulty grasping, and manipulating small objects. Eval: Pt. presents with limited [facility] skills with difficulty picking,and manipulating small objects.    Time 12    Period Weeks    Status On-going    Target Date 08/21/21      OT LONG TERM GOAL #6   Title Pt. will  improve UE strength by 2 mm grades to assist with ADLs, and IADLS,    Baseline [id number] visit: BUE strength; 4+/5 overall. Pt. continues to present with limited BUE stregth. Pt. is engaging in more ADL,a nd IADL tasks at home. Eval: pt. has difficulty performing.    Time 12    Period Weeks    Status On-going  Target Date 08/21/21      OT LONG TERM GOAL #7   Title Pt. will complete light meal preparation with minA    Baseline 06/16/2021: Pt. is assisting her daughter with cooking chiscken, bacon, slamon, and grits. Pt. is preparing meals with her daughter's assist. Eval: Pt. is unable to prepare light meal preparation.    Time 12    Period Weeks    Target Date 08/21/21      OT LONG TERM GOAL #8   Title Pt. will perform light home management tasks with minA    Baseline 06/16/2021: Pt. is independent with bedmaking, vacuuming, dishes, dusting, sweeping the porch. Pt. is independent with her, and har husband's laundry. Pt.'s daughter helps her down the outdoor steps be[place] of the stones. Pt. is engaging in more home management tasks. Eval: pt. is unable to perform    Time 12    Status On-going    Target Date 08/21/21      OT LONG TERM GOAL  #9   TITLE Pt will demonstrate improved visual/spatial skills as demonstrated by ability to adequately space words in a sentence with min vc.    Baseline 20th visit: Pt. requires cues, and assist for spacing when writing sentences.    Time 12    Period Weeks    Status On-going    Target Date 08/21/21                   Plan - 07/10/21 1423     Clinical Impression Statement p    OT Occupational Profile and History Problem Focused Assessment - Including review of records relating to presenting problem    Occupational performance deficits (Please refer to evaluation for details): ADL's;IADL's    Body Structure / Function / Physical Skills ADL;IADL;FMC;ROM;Strength    Rehab Potential Good    Clinical Decision Making Several treatment options,  min-mod task modification necessary    Comorbidities Affecting Occupational Performance: May have comorbidities impacting occupational performance    Modification or Assistance to Complete Evaluation  Min-Moderate modification of tasks or assist with assess necessary to complete eval    OT Frequency 2x / week    OT Duration 12 weeks    OT Treatment/Interventions Self-care/ADL training;Neuromuscular education;Therapeutic activities;Patient/family education;DME and/or AE instruction;Visual/perceptual remediation/compensation    Consulted and Agree with Plan of Care Patient             Patient will benefit from skilled therapeutic intervention in order to improve the following deficits and impairments:   Body Structure / Function / Physical Skills: ADL, IADL, FMC, ROM, Strength       Visit Diagnosis: Muscle weakness (generalized)  Other lack of coordination    Problem List Patient Active Problem List   Diagnosis Date Noted   Multiple lung nodules 03/27/2021   Dysarthria and anarthria 02/11/2021   Sinus tachycardia    Labile blood glucose    Dysphagia    Seizure prophylaxis    Malnutrition of moderate degree 01/23/2021   Anoxic brain injury ([place]) 01/10/2021   Prediabetes    Dyslipidemia    S/P percutaneous endoscopic gastrostomy (PEG) tube placement ([place])    Status post tracheostomy ([place])    Aspiration pneumonia of both lower lobes due to gastric secretions ([place]) 01/05/2021   Acute respiratory failure ([place])    Hypoxic ischemic encephalopathy    Endotracheal tube present    Cardiac arrest ([place]) 12/02/2020   Essential hypertension 12/09/2011    Harrel Carina 07/10/2021, 2:25 PM  [place] MAIN [facility] SERVICES [address] [place], [place], [address] Phone: [phone]   Fax:  [phone]  Name: [name] [name] MRN: [id number] Date of Birth: [birth year]

## 2021-07-11 NOTE — Therapy (Signed)
[place] MAIN [facility] SERVICES [address] [place], [place], [address] Phone: [phone]   Fax:  [phone]  Speech Language Pathology Treatment  Patient Details  Name: [name] [name] MRN: [id number] Date of Birth: Oct 20, 1966 Referring Provider (SLP): Mar[place]e [place], [place]   Encounter Date: 07/10/2021   End of Session - 07/11/21 1139     Visit Number 33    Number of Visits 66    Date for SLP Re-Evaluation 08/21/21    Authorization Type Self Pay    Authorization Time Period 05/28/2021 thru 08/21/2021    Authorization - Visit Number 3    Progress Note Due on Visit 10    SLP Start Time 1300    SLP Stop Time  1400    SLP Time Calculation (min) 60 min    Activity Tolerance Patient tolerated treatment well             Past Medical History:  Diagnosis Date   Anoxic brain injury ([place])    Cardiac arrest ([place])    Cognitive communication deficit    Hyperlipidemia    Hypertension    Seizures ([place])    Ventricular fibrillation ([place])     Past Surgical History:  Procedure Laterality Date   PEG PLACEMENT N/A 12/13/2020   Procedure: PERCUTANEOUS ENDOSCOPIC GASTROSTOMY (PEG) PLACEMENT;  Surgeon: Lesly Rubenstein, MD;  Location: [facility] ENDOSCOPY;  Service: Endoscopy;  Laterality: N/A;   TRACHEOSTOMY TUBE PLACEMENT N/A 12/13/2020   Procedure: TRACHEOSTOMY;  Surgeon: Clyde Canterbury, MD;  Location: [facility] ORS;  Service: ENT;  Laterality: N/A;    There were no vitals filed for this visit.   Subjective Assessment - 07/11/21 1135     Subjective pleasant, accompanied by her daughter    Patient is accompained by: Family member    Currently in Pain? No/denies                   ADULT SLP TREATMENT - 07/11/21 0001       Treatment Provided   Treatment provided Cognitive-Linquistic      Cognitive-Linquistic Treatment   Treatment focused on Cognition;Patient/family/caregiver education    Skilled Treatment COGNITION: improved ability to  identify directional positioning of items (total assistance to moderate assistance); improved ability to move items in correct direction (total to moderate); improved ability to problem solve taks (total to moderate)              SLP Education - 07/11/21 1138     Education Details provided on cognitive demands of driving, written test as well as ways to address current cogntiive deficits in preparation for driving in the distant future    Person(s) Educated Patient    Methods Explanation;Demonstration;Handout;Verbal cues    Comprehension Need further instruction              SLP Short Term Goals - 05/29/21 1536       SLP SHORT TERM GOAL #1   Title Pt will independently demonstrate selective attention in mildly distracting environment for 15 minutes.    Status Achieved      SLP SHORT TERM GOAL #2   Title With moderate cues, pt will utilize speech intelligibility strategies to achieve > 90% intelligibility at the sentence level.    Baseline Goal met, revised    Time 10    Period --   sessions   Status Revised      SLP SHORT TERM GOAL #3   Title With minimal cues, pt will utilize  compensatory memory strategies to recall therapy sessions for week as well as order of therapy in 8 out of 10 sessions.    Baseline goal met, goal upgraded    Time 10    Period --   sessions   Status Revised      SLP SHORT TERM GOAL #4   Title With moderate assistance, pt will complete basic problem solving tasks with >95% accuracy.    Time 10    Period --   sessions   Status On-going              SLP Long Term Goals - 05/29/21 1538       SLP LONG TERM GOAL #2   Title Pt will independently complete functional problem solving for basic and familiar tasks.    Time 12    Period Weeks    Status On-going    Target Date 08/21/21      SLP LONG TERM GOAL #3   Title Pt will demonstrate selective attention in a moderately distracting environment for 15 minutes with < 1 redirection to task.     Time 12    Period Weeks    Status On-going    Target Date 08/21/21      SLP LONG TERM GOAL #4   Title With minimal cues, pt will increase speech intelligibility to > 75% at the conversation level.    Baseline goal met, goal updated    Time 12    Period Weeks    Status Revised    Target Date 08/21/21              Plan - 07/11/21 1139     Clinical Impression Statement Pt presents with continued deficits in awareness and insight into cognitive impairments. In an effort to promote increased awareness, SLP assisted pt with identifying areas of deficits and their relationship to driving safely. Areas identified are high level cognitive processess that continue to be severely impaired. As such, skilled sT intervention cotninues to be required to target these areas and increase pt's functional independence as well as reduce caregiver burden.    Speech Therapy Frequency 2x / week    Duration 12 weeks    Treatment/Interventions Cognitive reorganization;Compensatory techniques;Internal/external aids;SLP instruction and feedback;Patient/family education;Functional tasks;Compensatory strategies    Potential to Achieve Goals Good    Potential Considerations Severity of impairments;Ability to learn/carryover information    Consulted and Agree with Plan of Care Patient             Patient will benefit from skilled therapeutic intervention in order to improve the following deficits and impairments:   Cognitive communication deficit  Anoxic brain injury [facility])    Problem List Patient Active Problem List   Diagnosis Date Noted   Multiple lung nodules 03/27/2021   Dysarthria and anarthria 02/11/2021   Sinus tachycardia    Labile blood glucose    Dysphagia    Seizure prophylaxis    Malnutrition of moderate degree 01/23/2021   Anoxic brain injury ([place]) 01/10/2021   Prediabetes    Dyslipidemia    S/P percutaneous endoscopic gastrostomy (PEG) tube placement [facility])    Status post  tracheostomy ([place])    Aspiration pneumonia of both lower lobes due to gastric secretions ([place]) 01/05/2021   Acute respiratory failure ([facility])    Hypoxic ischemic encephalopathy    Endotracheal tube present    Cardiac arrest ([place]) 12/02/2020   Essential hypertension 12/09/2011   Kalyiah Saintil B. Rutherford Nail, M.S., CCC-SLP, CBIS Speech-Language Pathologist Rehabilitation  Services Office [phone]  Stormy Fabian 07/11/2021, 11:42 AM  [place] MAIN [facility] SERVICES [address] [place], [place], [address] Phone: [phone]   Fax:  [phone]   Name: VIKKIE GOEDEN MRN: [id number] Date of Birth: 10-10-1966

## 2021-07-14 ENCOUNTER — Ambulatory Visit: Payer: Self-pay | Admitting: Speech Pathology

## 2021-07-14 ENCOUNTER — Other Ambulatory Visit: Payer: Self-pay

## 2021-07-14 ENCOUNTER — Ambulatory Visit: Payer: Self-pay

## 2021-07-14 DIAGNOSIS — G931 Anoxic brain damage, not elsewhere classified: Secondary | ICD-10-CM

## 2021-07-14 DIAGNOSIS — R278 Other lack of coordination: Secondary | ICD-10-CM

## 2021-07-14 NOTE — Therapy (Signed)
[place] MAIN [facility] SERVICES [address] [place], [place], [address] Phone: [phone]   Fax:  [phone]  Occupational Therapy Treatment  Patient Details  Name: [name] [name] MRN: [id number] Date of Birth: Mar 23, 1966 No data recorded  Encounter Date: 07/14/2021   OT End of Session - 07/14/21 1140     Visit Number 27    Number of Visits 39    Date for OT Re-Evaluation 08/21/21    Authorization Type Progress reports period starting 02/11/2021    OT Start Time 1040    OT Stop Time 1125    OT Time Calculation (min) 45 min    Activity Tolerance Patient tolerated treatment well    Behavior During Therapy [facility] for tasks assessed/performed             Past Medical History:  Diagnosis Date   Anoxic brain injury ([place])    Cardiac arrest ([place])    Cognitive communication deficit    Hyperlipidemia    Hypertension    Seizures ([place])    Ventricular fibrillation ([place])     Past Surgical History:  Procedure Laterality Date   PEG PLACEMENT N/A 12/13/2020   Procedure: PERCUTANEOUS ENDOSCOPIC GASTROSTOMY (PEG) PLACEMENT;  Surgeon: Lesly Rubenstein, MD;  Location: [facility] ENDOSCOPY;  Service: Endoscopy;  Laterality: N/A;   TRACHEOSTOMY TUBE PLACEMENT N/A 12/13/2020   Procedure: TRACHEOSTOMY;  Surgeon: Clyde Canterbury, MD;  Location: [facility] ORS;  Service: ENT;  Laterality: N/A;    There were no vitals filed for this visit.   Subjective Assessment - 07/14/21 1138     Subjective  Pt reports she's doing ok today.    Patient is accompanied by: Family member    Pertinent History Pt. is a 55 y.o. female who went into cardiac arrest, and was admitted to [facility] on 12/02/2020. Pt. was diagnosed with Anoxic Brain Injury following the Cardiac Arrest. When medically stable, the pt. was tranferred to [facility]. The patient was discharged home on 02/08/2021, and is ready to begin outpatient OT services. Pt. was independent, and working prior to the  onset. Pt. Has very supportive family. Pt. Enjoys gardening.    Patient Stated Goals Pt would like to be as independent as possible.    Currently in Pain? No/denies    Multiple Pain Sites No            Occupational Therapy Treatment: Therapeutic Activity: Participation in visual perceptual retraining activities copying simple design with colored pegs on pegboard and using wooden shapes to fill in tangram puzzles.    Response to Treatment: Improved/more organized scanning patterns noted today when copying shapes on pegboard.  Pt required min vc to correct errors when copying designs.  Required mod vc and extra time to accurately complete intermediate tangram puzzle, extra time to complete a simple puzzle.  Pt was able to sort a box of 4 different shapes with extra time and min vc to scan the table top and organize her sorting system.  Visual perceptual deficits contribute to increased assist required for IADLs.  Pt will continue to benefit from OT to address visual perceptual deficits in order to maximize her indep with IADLs.       OT Education - 07/14/21 1139     Education Details Visual Perceptual activities for home program    Person(s) Educated Patient    Methods Explanation;Demonstration    Comprehension Verbalized understanding;Returned demonstration  OT Long Term Goals - 06/16/21 1434       OT LONG TERM GOAL #1   Title Pt. will be able to independently sign her name with 100% legibility.    Baseline Pt. continues to present with limited signature. Eval: Signature not legible, however was able to sign on the line with no deviation above, or below the line.  Recert: Signature with 75% legibility. Spacing words on a line is impaired and requires extensive cueing to scan L to R across the paper. 20th visit: 75% legible printed, 50% signature.    Time 12    Period Weeks    Status On-going    Target Date 08/21/21      OT LONG TERM GOAL #2   Title Pt.  will increase right shoulder AROM to be able to independently retrieve items from closets, and cabinets.    Baseline Pt. has improved with AROM, and is now able to retrieve items from closets, and cabinets. Eval: Pt. presents with limited ROM. Recert: Pt has good AROM overhead but repetitive reaching causes fatigue and need for rest breaks. 20th visit: pt. has progressed with AROM, and functional reaching.    Time 12    Period Weeks    Status Achieved      OT LONG TERM GOAL #3   Title Pt. will increase right grip strength by 5# to be able hold a full glass    Baseline Pt. is improving with formulating a composite fist. Eval: limited grip strength. Limited bilateral digit flexion, unable to make a full composite fist. 20th visit: Pt. is making excellent progress with bilateral grip strengt. Pt. is able to hold, a d drink from a full glass.    Time 12    Period Weeks    Status On-going    Target Date 08/21/21      OT LONG TERM GOAL #4   Title Pt. will independently access a computer, and type a one sentence email correspondence.    Baseline 20th visit: Pt. continues to have difficulty tying a sentence leggibly. Pt. is able to type a one sentence correspondence with multiple mistypes. Eval: Pt. is unable to to perform    Time 12    Period Weeks    Status On-going    Target Date 08/21/21      OT LONG TERM GOAL #5   Title Pt. will improve bilateral [facility] skills to be able to grasp, and manipulate small objects during ADLs.    Baseline 20th visit: Pt. has made excellent progress with bialteral [facility] skills, and grasping small objects. Pt. continues to have difficulty grasping, and manipulating small objects. Eval: Pt. presents with limited [facility] skills with difficulty picking,and manipulating small objects.    Time 12    Period Weeks    Status On-going    Target Date 08/21/21      OT LONG TERM GOAL #6   Title Pt. will improve UE strength by 2 mm grades to assist with ADLs, and IADLS,    Baseline  [id number] visit: BUE strength; 4+/5 overall. Pt. continues to present with limited BUE stregth. Pt. is engaging in more ADL,a nd IADL tasks at home. Eval: pt. has difficulty performing.    Time 12    Period Weeks    Status On-going    Target Date 08/21/21      OT LONG TERM GOAL #7   Title Pt. will complete light meal preparation with minA    Baseline 06/16/2021: Pt. is  assisting her daughter with cooking chiscken, bacon, slamon, and grits. Pt. is preparing meals with her daughter's assist. Eval: Pt. is unable to prepare light meal preparation.    Time 12    Period Weeks    Target Date 08/21/21      OT LONG TERM GOAL #8   Title Pt. will perform light home management tasks with minA    Baseline 06/16/2021: Pt. is independent with bedmaking, vacuuming, dishes, dusting, sweeping the porch. Pt. is independent with her, and har husband's laundry. Pt.'s daughter helps her down the outdoor steps [place] of the stones. Pt. is engaging in more home management tasks. Eval: pt. is unable to perform    Time 12    Status On-going    Target Date 08/21/21      OT LONG TERM GOAL  #9   TITLE Pt will demonstrate improved visual/spatial skills as demonstrated by ability to adequately space words in a sentence with min vc.    Baseline 20th visit: Pt. requires cues, and assist for spacing when writing sentences.    Time 12    Period Weeks    Status On-going    Target Date 08/21/21                   Plan - 07/14/21 1150     Clinical Impression Statement Improved/more organized scanning patterns noted today when copying shapes on pegboard.  Pt required min vc to correct errors when copying designs.  Required mod vc and extra time to accurately complete intermediate tangram puzzle, extra time to complete a simple puzzle.  Pt was able to sort a box of 4 different shapes with extra time and min vc to scan the table top and organize her sorting system.  Visual perceptual deficits contribute to increased assist  required for IADLs.  Pt will continue to benefit from OT to address visual perceptual deficits in order to maximize her indep with IADLs.    OT Occupational Profile and History Problem Focused Assessment - Including review of records relating to presenting problem    Occupational performance deficits (Please refer to evaluation for details): ADL's;IADL's    Body Structure / Function / Physical Skills ADL;IADL;FMC;ROM;Strength    Rehab Potential Good    Clinical Decision Making Several treatment options, min-mod task modification necessary    Comorbidities Affecting Occupational Performance: May have comorbidities impacting occupational performance    Modification or Assistance to Complete Evaluation  Min-Moderate modification of tasks or assist with assess necessary to complete eval    OT Frequency 2x / week    OT Duration 12 weeks    OT Treatment/Interventions Self-care/ADL training;Neuromuscular education;Therapeutic activities;Patient/family education;DME and/or AE instruction;Visual/perceptual remediation/compensation    Consulted and Agree with Plan of Care Patient             Patient will benefit from skilled therapeutic intervention in order to improve the following deficits and impairments:   Body Structure / Function / Physical Skills: ADL, IADL, FMC, ROM, Strength       Visit Diagnosis: Anoxic brain injury ([place])  Other lack of coordination    Problem List Patient Active Problem List   Diagnosis Date Noted   Multiple lung nodules 03/27/2021   Dysarthria and anarthria 02/11/2021   Sinus tachycardia    Labile blood glucose    Dysphagia    Seizure prophylaxis    Malnutrition of moderate degree 01/23/2021   Anoxic brain injury ([place]) 01/10/2021   Prediabetes    Dyslipidemia  S/P percutaneous endoscopic gastrostomy (PEG) tube placement [facility])    Status post tracheostomy ([place])    Aspiration pneumonia of both lower lobes due to gastric secretions ([place]) 01/05/2021    Acute respiratory failure ([place])    Hypoxic ischemic encephalopathy    Endotracheal tube present    Cardiac arrest ([place]) 12/02/2020   Essential hypertension 12/09/2011   Leta Speller, MS, OTR/L  Darleene Cleaver 07/14/2021, 11:51 AM  [place] MAIN [facility] SERVICES [address] [place], [place], [address] Phone: [phone]   Fax:  [phone]  Name: MALANII PRUNEAU MRN: [id number] Date of Birth: 03/31/1966

## 2021-07-15 ENCOUNTER — Encounter: Payer: Self-pay | Admitting: Speech Pathology

## 2021-07-15 ENCOUNTER — Encounter: Payer: Self-pay | Admitting: Occupational Therapy

## 2021-07-17 ENCOUNTER — Other Ambulatory Visit: Payer: Self-pay

## 2021-07-17 ENCOUNTER — Ambulatory Visit: Payer: Self-pay | Admitting: Occupational Therapy

## 2021-07-17 ENCOUNTER — Encounter: Payer: Self-pay | Admitting: Occupational Therapy

## 2021-07-17 ENCOUNTER — Ambulatory Visit: Payer: Self-pay | Admitting: Speech Pathology

## 2021-07-17 DIAGNOSIS — R41841 Cognitive communication deficit: Secondary | ICD-10-CM

## 2021-07-17 DIAGNOSIS — M6281 Muscle weakness (generalized): Secondary | ICD-10-CM

## 2021-07-17 DIAGNOSIS — G931 Anoxic brain damage, not elsewhere classified: Secondary | ICD-10-CM

## 2021-07-17 DIAGNOSIS — R278 Other lack of coordination: Secondary | ICD-10-CM

## 2021-07-17 NOTE — Therapy (Signed)
[place] MAIN [facility] SERVICES [address] [place], [place], [address] Phone: [phone]   Fax:  [phone]  Speech Language Pathology Treatment  Patient Details  Name: [name] [name] MRN: [id number] Date of Birth: 04/03/1966 Referring Provider (SLP): [place]e [place], [place]   Encounter Date: 07/17/2021   End of Session - 07/17/21 2212     Visit Number 34    Number of Visits 1    Date for SLP Re-Evaluation 08/21/21    Authorization Type Self Pay    Authorization Time Period 05/28/2021 thru 08/21/2021    Authorization - Visit Number 4    Progress Note Due on Visit 10    SLP Start Time 1500    SLP Stop Time  1600    SLP Time Calculation (min) 60 min    Activity Tolerance Patient tolerated treatment well             Past Medical History:  Diagnosis Date   Anoxic brain injury ([place])    Cardiac arrest ([place])    Cognitive communication deficit    Hyperlipidemia    Hypertension    Seizures ([place])    Ventricular fibrillation ([place])     Past Surgical History:  Procedure Laterality Date   PEG PLACEMENT N/A 12/13/2020   Procedure: PERCUTANEOUS ENDOSCOPIC GASTROSTOMY (PEG) PLACEMENT;  Surgeon: Lesly Rubenstein, MD;  Location: [facility] ENDOSCOPY;  Service: Endoscopy;  Laterality: N/A;   TRACHEOSTOMY TUBE PLACEMENT N/A 12/13/2020   Procedure: TRACHEOSTOMY;  Surgeon: Clyde Canterbury, MD;  Location: [facility] ORS;  Service: ENT;  Laterality: N/A;    There were no vitals filed for this visit.   Subjective Assessment - 07/17/21 2210     Subjective "I am so upset"    Currently in Pain? No/denies                   ADULT SLP TREATMENT - 07/17/21 0001       Treatment Provided   Treatment provided Cognitive-Linquistic      Cognitive-Linquistic Treatment   Treatment focused on Cognition;Patient/family/caregiver education    Skilled Treatment COGNITION: Pt tearful regarding her recent weight gain d/t presumed menopause as well as some of her  family's comments regarding weight gain (specifically her brother, sister-in-law and mother). SLP assisted pt in creating strategies as well as verbal sentences to use in response to people's freely offered opinions about her weight as well as several other identified areas of her life. SLP further assisted pt in identifying characteristics that she associates with her vision of herself. She then selected one of the identified characteristics and strategies were created to hep pt achieve.              SLP Education - 07/17/21 2211     Education Details steps to increase pt's confidence despite cognitive communication deficits    Person(s) Educated Patient    Methods Explanation;Demonstration;Verbal cues    Comprehension Verbalized understanding;Need further instruction              SLP Short Term Goals - 05/29/21 1536       SLP SHORT TERM GOAL #1   Title Pt will independently demonstrate selective attention in mildly distracting environment for 15 minutes.    Status Achieved      SLP SHORT TERM GOAL #2   Title With moderate cues, pt will utilize speech intelligibility strategies to achieve > 90% intelligibility at the sentence level.    Baseline Goal met, revised  Time 10    Period --   sessions   Status Revised      SLP SHORT TERM GOAL #3   Title With minimal cues, pt will utilize compensatory memory strategies to recall therapy sessions for week as well as order of therapy in 8 out of 10 sessions.    Baseline goal met, goal upgraded    Time 10    Period --   sessions   Status Revised      SLP SHORT TERM GOAL #4   Title With moderate assistance, pt will complete basic problem solving tasks with >95% accuracy.    Time 10    Period --   sessions   Status On-going              SLP Long Term Goals - 05/29/21 1538       SLP LONG TERM GOAL #2   Title Pt will independently complete functional problem solving for basic and familiar tasks.    Time 12    Period  Weeks    Status On-going    Target Date 08/21/21      SLP LONG TERM GOAL #3   Title Pt will demonstrate selective attention in a moderately distracting environment for 15 minutes with < 1 redirection to task.    Time 12    Period Weeks    Status On-going    Target Date 08/21/21      SLP LONG TERM GOAL #4   Title With minimal cues, pt will increase speech intelligibility to > 75% at the conversation level.    Baseline goal met, goal updated    Time 12    Period Weeks    Status Revised    Target Date 08/21/21              Plan - 07/17/21 2212     Clinical Impression Statement Pt continues to present with moderate to severe cognitive communication deficits related to anoxic brain injury. These deficits appear to result in decreased confidence in herself. Skilled ST intervention is required to target pt's cognitive communication deficits to increase her functional independence and her confidence in herself within her extended family, the community, work and leisure activities.    Speech Therapy Frequency 2x / week    Duration 12 weeks    Treatment/Interventions Cognitive reorganization;Compensatory techniques;Internal/external aids;SLP instruction and feedback;Patient/family education;Functional tasks;Compensatory strategies    Potential to Achieve Goals Good    Potential Considerations Severity of impairments;Ability to learn/carryover information    Consulted and Agree with Plan of Care Patient             Patient will benefit from skilled therapeutic intervention in order to improve the following deficits and impairments:   Cognitive communication deficit  Anoxic brain injury [facility])    Problem List Patient Active Problem List   Diagnosis Date Noted   Multiple lung nodules 03/27/2021   Dysarthria and anarthria 02/11/2021   Sinus tachycardia    Labile blood glucose    Dysphagia    Seizure prophylaxis    Malnutrition of moderate degree 01/23/2021   Anoxic brain  injury ([place]) 01/10/2021   Prediabetes    Dyslipidemia    S/P percutaneous endoscopic gastrostomy (PEG) tube placement [facility])    Status post tracheostomy ([place])    Aspiration pneumonia of both lower lobes due to gastric secretions ([place]) 01/05/2021   Acute respiratory failure ([facility])    Hypoxic ischemic encephalopathy    Endotracheal tube present  Cardiac arrest ([place]) 12/02/2020   Essential hypertension 12/09/2011   Gianni Mihalik B. Rutherford Nail M.S., CCC-SLP, [facility]ologist Rehabilitation Services Office [phone]  Stormy Fabian 07/17/2021, 10:14 PM  [place] MAIN [facility] SERVICES [address] [place], [place], [address] Phone: [phone]   Fax:  [phone]   Name: DANISA KOPEC MRN: [id number] Date of Birth: 1966-07-03

## 2021-07-17 NOTE — Therapy (Signed)
[place] MAIN [facility] SERVICES [address] [place], [place], [address] Phone: [phone]   Fax:  [phone]  Occupational Therapy Treatment  Patient Details  Name: [name] [name] MRN: [id number] Date of Birth: 04-01-1966 No data recorded  Encounter Date: 07/17/2021   OT End of Session - 07/17/21 1617     Visit Number 28    Number of Visits 39    Date for OT Re-Evaluation 08/21/21    Authorization Type Progress reports period starting 02/11/2021    OT Start Time 1610    OT Stop Time 1645    OT Time Calculation (min) 35 min    Activity Tolerance Patient tolerated treatment well    Behavior During Therapy WFL for tasks assessed/performed             Past Medical History:  Diagnosis Date   Anoxic brain injury ([place])    Cardiac arrest ([place])    Cognitive communication deficit    Hyperlipidemia    Hypertension    Seizures ([place])    Ventricular fibrillation ([place])     Past Surgical History:  Procedure Laterality Date   PEG PLACEMENT N/A 12/13/2020   Procedure: PERCUTANEOUS ENDOSCOPIC GASTROSTOMY (PEG) PLACEMENT;  Surgeon: Lesly Rubenstein, MD;  Location: [facility] ENDOSCOPY;  Service: Endoscopy;  Laterality: N/A;   TRACHEOSTOMY TUBE PLACEMENT N/A 12/13/2020   Procedure: TRACHEOSTOMY;  Surgeon: Clyde Canterbury, MD;  Location: [facility] ORS;  Service: ENT;  Laterality: N/A;    There were no vitals filed for this visit.   Subjective Assessment - 07/17/21 1616     Subjective  Pt reports she's doing ok today.    Patient is accompanied by: Family member    Pertinent History Pt. is a 55 y.o. female who went into cardiac arrest, and was admitted to [facility] on 12/02/2020. Pt. was diagnosed with Anoxic Brain Injury following the Cardiac Arrest. When medically stable, the pt. was tranferred to [place] rehab. The patient was discharged home on 02/08/2021, and is ready to begin outpatient OT services. Pt. was independent, and working prior to the  onset. Pt. Has very supportive family. Pt. Enjoys gardening.    Currently in Pain? No/denies           OT TREATMENT    Therapeutic Activities:   Pt. worked on following [employer] and aligning tertra pieces according to the design image placed on the tabletop vertically in front of her.   Pt. was able to independently, and efficiently complete the first 1/3, and 1/4 of the design pattern without cues. Pt. required the most cuing for the middle portion of the design. Pt. Required increased time to complete.  Pt. required  step-by-step verbal, and visual cues to turn the design pieces in the appropriate direction to fit, and follow the exact design pattern. Pt. Continues to work on improving visual perceptual skills,UE strength, bilateral hand function in order to maximize independence with ADLs, and IADL tasks.                           OT Education - 07/17/21 1617     Education Details Visual Perceptual activities for home program    Person(s) Educated Patient    Methods Explanation;Demonstration    Comprehension Verbalized understanding;Returned demonstration                 OT Long Term Goals - 06/16/21 1434  OT LONG TERM GOAL #1   Title Pt. will be able to independently sign her name with 100% legibility.    Baseline Pt. continues to present with limited signature. Eval: Signature not legible, however was able to sign on the line with no deviation above, or below the line.  Recert: Signature with 75% legibility. Spacing words on a line is impaired and requires extensive cueing to scan L to R across the paper. 20th visit: 75% legible printed, 50% signature.    Time 12    Period Weeks    Status On-going    Target Date 08/21/21      OT LONG TERM GOAL #2   Title Pt. will increase right shoulder AROM to be able to independently retrieve items from closets, and cabinets.    Baseline Pt. has improved with AROM, and is now able to  retrieve items from closets, and cabinets. Eval: Pt. presents with limited ROM. Recert: Pt has good AROM overhead but repetitive reaching causes fatigue and need for rest breaks. 20th visit: pt. has progressed with AROM, and functional reaching.    Time 12    Period Weeks    Status Achieved      OT LONG TERM GOAL #3   Title Pt. will increase right grip strength by 5# to be able hold a full glass    Baseline Pt. is improving with formulating a composite fist. Eval: limited grip strength. Limited bilateral digit flexion, unable to make a full composite fist. 20th visit: Pt. is making excellent progress with bilateral grip strengt. Pt. is able to hold, a d drink from a full glass.    Time 12    Period Weeks    Status On-going    Target Date 08/21/21      OT LONG TERM GOAL #4   Title Pt. will independently access a computer, and type a one sentence email correspondence.    Baseline 20th visit: Pt. continues to have difficulty tying a sentence leggibly. Pt. is able to type a one sentence correspondence with multiple mistypes. Eval: Pt. is unable to to perform    Time 12    Period Weeks    Status On-going    Target Date 08/21/21      OT LONG TERM GOAL #5   Title Pt. will improve bilateral [facility] skills to be able to grasp, and manipulate small objects during ADLs.    Baseline 20th visit: Pt. has made excellent progress with bialteral [facility] skills, and grasping small objects. Pt. continues to have difficulty grasping, and manipulating small objects. Eval: Pt. presents with limited [facility] skills with difficulty picking,and manipulating small objects.    Time 12    Period Weeks    Status On-going    Target Date 08/21/21      OT LONG TERM GOAL #6   Title Pt. will improve UE strength by 2 mm grades to assist with ADLs, and IADLS,    Baseline [id number] visit: BUE strength; 4+/5 overall. Pt. continues to present with limited BUE stregth. Pt. is engaging in more ADL,a nd IADL tasks at home. Eval: pt. has  difficulty performing.    Time 12    Period Weeks    Status On-going    Target Date 08/21/21      OT LONG TERM GOAL #7   Title Pt. will complete light meal preparation with minA    Baseline 06/16/2021: Pt. is assisting her daughter with cooking chiscken, bacon, slamon, and grits. Pt. is preparing  meals with her daughter's assist. Eval: Pt. is unable to prepare light meal preparation.    Time 12    Period Weeks    Target Date 08/21/21      OT LONG TERM GOAL #8   Title Pt. will perform light home management tasks with minA    Baseline 06/16/2021: Pt. is independent with bedmaking, vacuuming, dishes, dusting, sweeping the porch. Pt. is independent with her, and har husband's laundry. Pt.'s daughter helps her down the outdoor steps [place] of the stones. Pt. is engaging in more home management tasks. Eval: pt. is unable to perform    Time 12    Status On-going    Target Date 08/21/21      OT LONG TERM GOAL  #9   TITLE Pt will demonstrate improved visual/spatial skills as demonstrated by ability to adequately space words in a sentence with min vc.    Baseline 20th visit: Pt. requires cues, and assist for spacing when writing sentences.    Time 12    Period Weeks    Status On-going    Target Date 08/21/21                   Plan - 07/17/21 1618     Clinical Impression Statement Pt. was able to independently, and efficiently complete the first 1/3, and 1/4 of the design pattern without cues. Pt. required the most cuing for the middle portion of the design. Pt. Required increased time to complete.  Pt. required  step-by-step verbal, and visual cues to turn the design pieces in the appropriate direction to fit, and follow the exact design pattern. Pt. Continues to work on improving visual perceptual skills,UE strength, bilateral hand function in order to maximize independence with ADLs, and IADL tasks.     OT Occupational Profile and History Problem Focused Assessment - Including  review of records relating to presenting problem    Occupational performance deficits (Please refer to evaluation for details): ADL's;IADL's    Body Structure / Function / Physical Skills ADL;IADL;FMC;ROM;Strength    Rehab Potential Good    Clinical Decision Making Several treatment options, min-mod task modification necessary    Comorbidities Affecting Occupational Performance: May have comorbidities impacting occupational performance    Modification or Assistance to Complete Evaluation  Min-Moderate modification of tasks or assist with assess necessary to complete eval    OT Frequency 2x / week    OT Duration 12 weeks    OT Treatment/Interventions Self-care/ADL training;Neuromuscular education;Therapeutic activities;Patient/family education;DME and/or AE instruction;Visual/perceptual remediation/compensation    Consulted and Agree with Plan of Care Patient             Patient will benefit from skilled therapeutic intervention in order to improve the following deficits and impairments:   Body Structure / Function / Physical Skills: ADL, IADL, FMC, ROM, Strength       Visit Diagnosis: Muscle weakness (generalized)  Other lack of coordination  Cognitive communication deficit    Problem List Patient Active Problem List   Diagnosis Date Noted   Multiple lung nodules 03/27/2021   Dysarthria and anarthria 02/11/2021   Sinus tachycardia    Labile blood glucose    Dysphagia    Seizure prophylaxis    Malnutrition of moderate degree 01/23/2021   Anoxic brain injury ([place]) 01/10/2021   Prediabetes    Dyslipidemia    S/P percutaneous endoscopic gastrostomy (PEG) tube placement ([place])    Status post tracheostomy ([place])    Aspiration pneumonia of both lower  lobes due to gastric secretions ([place]) 01/05/2021   Acute respiratory failure ([place])    Hypoxic ischemic encephalopathy    Endotracheal tube present    Cardiac arrest ([place]) 12/02/2020   Essential hypertension 12/09/2011     Harrel Carina, MS, OTR/L 07/17/2021, 4:22 PM  [place] MAIN [facility] SERVICES [address] [place], [place], [address] Phone: [phone]   Fax:  [phone]  Name: [name] [name] MRN: [id number] Date of Birth: 09-16-1966

## 2021-07-22 ENCOUNTER — Other Ambulatory Visit: Payer: Self-pay

## 2021-07-22 ENCOUNTER — Ambulatory Visit: Payer: Self-pay | Admitting: Occupational Therapy

## 2021-07-22 ENCOUNTER — Ambulatory Visit: Payer: Self-pay | Admitting: Speech Pathology

## 2021-07-22 DIAGNOSIS — G931 Anoxic brain damage, not elsewhere classified: Secondary | ICD-10-CM

## 2021-07-22 DIAGNOSIS — R278 Other lack of coordination: Secondary | ICD-10-CM

## 2021-07-22 DIAGNOSIS — M6281 Muscle weakness (generalized): Secondary | ICD-10-CM

## 2021-07-22 DIAGNOSIS — R41841 Cognitive communication deficit: Secondary | ICD-10-CM

## 2021-07-22 NOTE — Therapy (Signed)
[place] MAIN [facility] SERVICES [address] [place], [place], [address] Phone: [phone]   Fax:  [phone]  Occupational Therapy Treatment  Patient Details  Name: [name] [name] MRN: [id number] Date of Birth: 1966-01-31 No data recorded  Encounter Date: 07/22/2021   OT End of Session - 07/22/21 1625     Visit Number 29    Number of Visits 39    Date for OT Re-Evaluation 08/21/21    Authorization Type Progress reports period starting 02/11/2021    OT Start Time 1615    OT Stop Time [id number]    OT Time Calculation (min) 60 min    Activity Tolerance Patient tolerated treatment well    Behavior During Therapy [facility] for tasks assessed/performed             Past Medical History:  Diagnosis Date   Anoxic brain injury ([place])    Cardiac arrest ([place])    Cognitive communication deficit    Hyperlipidemia    Hypertension    Seizures ([place])    Ventricular fibrillation ([place])     Past Surgical History:  Procedure Laterality Date   PEG PLACEMENT N/A 12/13/2020   Procedure: PERCUTANEOUS ENDOSCOPIC GASTROSTOMY (PEG) PLACEMENT;  Surgeon: Lesly Rubenstein, MD;  Location: [facility] ENDOSCOPY;  Service: Endoscopy;  Laterality: N/A;   TRACHEOSTOMY TUBE PLACEMENT N/A 12/13/2020   Procedure: TRACHEOSTOMY;  Surgeon: Clyde Canterbury, MD;  Location: [facility] ORS;  Service: ENT;  Laterality: N/A;    There were no vitals filed for this visit.   Subjective Assessment - 07/22/21 1624     Subjective  Pt. reports that she is doing well today.    Patient is accompanied by: Family member    Pertinent History Pt. is a 55 y.o. female who went into cardiac arrest, and was admitted to [facility] on 12/02/2020. Pt. was diagnosed with Anoxic Brain Injury following the Cardiac Arrest. When medically stable, the pt. was tranferred to [place] rehab. The patient was discharged home on 02/08/2021, and is ready to begin outpatient OT services. Pt. was independent, and working  prior to the onset. Pt. Has very supportive family. Pt. Enjoys gardening.    Currently in Pain? No/denies            OT TREATMENT   Therapeutic Ex.:  Pt. worked on the [employer] for 9 min. With constant monitoring of the BUEs. Pt. Worked on changing, and alternating forward reverse position every 2 min. Rest breaks were required.     Therapeutic Activities:   Pt. worked on Pension scheme manager and aligning tertra pieces according to a Restaurant manager, fast food placed on the tabletop vertically in front of her.   Pt. was able to independently complete the first 3/4s of the design pattern without cues, however required assist to complete the final 1/4 of the design. Pt. required cues to identify, and make corrections. At times, Pt. required assist, and cues for the direction of the design pieces. Pt. required increased time to complete. Pt. required fewer step-by-step verbal, and visual cues to turn the design pieces in the appropriate direction to fit, and follow the exact design pattern. Pt. Continues to work on improving visual perceptual skills, UE strength, bilateral hand function in order to maximize independence with ADLs, and IADL tasks.                           OT Education - 07/22/21 1625  Education Details Visual Perceptual activities for home program    Person(s) Educated Patient    Methods Explanation;Demonstration    Comprehension Verbalized understanding;Returned demonstration                 OT Long Term Goals - 06/16/21 1434       OT LONG TERM GOAL #1   Title Pt. will be able to independently sign her name with 100% legibility.    Baseline Pt. continues to present with limited signature. Eval: Signature not legible, however was able to sign on the line with no deviation above, or below the line.  Recert: Signature with 75% legibility. Spacing words on a line is impaired and requires extensive cueing to scan L to R across  the paper. 20th visit: 75% legible printed, 50% signature.    Time 12    Period Weeks    Status On-going    Target Date 08/21/21      OT LONG TERM GOAL #2   Title Pt. will increase right shoulder AROM to be able to independently retrieve items from closets, and cabinets.    Baseline Pt. has improved with AROM, and is now able to retrieve items from closets, and cabinets. Eval: Pt. presents with limited ROM. Recert: Pt has good AROM overhead but repetitive reaching causes fatigue and need for rest breaks. 20th visit: pt. has progressed with AROM, and functional reaching.    Time 12    Period Weeks    Status Achieved      OT LONG TERM GOAL #3   Title Pt. will increase right grip strength by 5# to be able hold a full glass    Baseline Pt. is improving with formulating a composite fist. Eval: limited grip strength. Limited bilateral digit flexion, unable to make a full composite fist. 20th visit: Pt. is making excellent progress with bilateral grip strengt. Pt. is able to hold, a d drink from a full glass.    Time 12    Period Weeks    Status On-going    Target Date 08/21/21      OT LONG TERM GOAL #4   Title Pt. will independently access a computer, and type a one sentence email correspondence.    Baseline 20th visit: Pt. continues to have difficulty tying a sentence leggibly. Pt. is able to type a one sentence correspondence with multiple mistypes. Eval: Pt. is unable to to perform    Time 12    Period Weeks    Status On-going    Target Date 08/21/21      OT LONG TERM GOAL #5   Title Pt. will improve bilateral [facility] skills to be able to grasp, and manipulate small objects during ADLs.    Baseline 20th visit: Pt. has made excellent progress with bialteral [facility] skills, and grasping small objects. Pt. continues to have difficulty grasping, and manipulating small objects. Eval: Pt. presents with limited [facility] skills with difficulty picking,and manipulating small objects.    Time 12    Period  Weeks    Status On-going    Target Date 08/21/21      OT LONG TERM GOAL #6   Title Pt. will improve UE strength by 2 mm grades to assist with ADLs, and IADLS,    Baseline [id number] visit: BUE strength; 4+/5 overall. Pt. continues to present with limited BUE stregth. Pt. is engaging in more ADL,a nd IADL tasks at home. Eval: pt. has difficulty performing.    Time 12  Period Weeks    Status On-going    Target Date 08/21/21      OT LONG TERM GOAL #7   Title Pt. will complete light meal preparation with minA    Baseline 06/16/2021: Pt. is assisting her daughter with cooking chiscken, bacon, slamon, and grits. Pt. is preparing meals with her daughter's assist. Eval: Pt. is unable to prepare light meal preparation.    Time 12    Period Weeks    Target Date 08/21/21      OT LONG TERM GOAL #8   Title Pt. will perform light home management tasks with minA    Baseline 06/16/2021: Pt. is independent with bedmaking, vacuuming, dishes, dusting, sweeping the porch. Pt. is independent with her, and har husband's laundry. Pt.'s daughter helps her down the outdoor steps [place] of the stones. Pt. is engaging in more home management tasks. Eval: pt. is unable to perform    Time 12    Status On-going    Target Date 08/21/21      OT LONG TERM GOAL  #9   TITLE Pt will demonstrate improved visual/spatial skills as demonstrated by ability to adequately space words in a sentence with min vc.    Baseline 20th visit: Pt. requires cues, and assist for spacing when writing sentences.    Time 12    Period Weeks    Status On-going    Target Date 08/21/21                   Plan - 07/22/21 1626     Clinical Impression Statement Pt. was able to independently complete the first 3/4s of the design pattern without cues, however required assist to complete the final 1/4 of the design. Pt. required cues to identify, and make corrections. At times, Pt. required assist, and cues for the direction of the design  pieces. Pt. required increased time to complete. Pt. required fewer step-by-step verbal, and visual cues to turn the design pieces in the appropriate direction to fit, and follow the exact design pattern. Pt. Continues to work on improving visual perceptual skills, UE strength, bilateral hand function in order to maximize independence with ADLs, and IADL tasks.             OT Occupational Profile and History Problem Focused Assessment - Including review of records relating to presenting problem    Occupational performance deficits (Please refer to evaluation for details): ADL's;IADL's    Body Structure / Function / Physical Skills ADL;IADL;FMC;ROM;Strength    Rehab Potential Good    Clinical Decision Making Several treatment options, min-mod task modification necessary    Comorbidities Affecting Occupational Performance: May have comorbidities impacting occupational performance    Modification or Assistance to Complete Evaluation  Min-Moderate modification of tasks or assist with assess necessary to complete eval    OT Frequency 2x / week    OT Duration 12 weeks    OT Treatment/Interventions Self-care/ADL training;Neuromuscular education;Therapeutic activities;Patient/family education;DME and/or AE instruction;Visual/perceptual remediation/compensation    Consulted and Agree with Plan of Care Patient             Patient will benefit from skilled therapeutic intervention in order to improve the following deficits and impairments:   Body Structure / Function / Physical Skills: ADL, IADL, FMC, ROM, Strength       Visit Diagnosis: Muscle weakness (generalized)  Other lack of coordination    Problem List Patient Active Problem List   Diagnosis Date Noted   Multiple lung  nodules 03/27/2021   Dysarthria and anarthria 02/11/2021   Sinus tachycardia    Labile blood glucose    Dysphagia    Seizure prophylaxis    Malnutrition of moderate degree 01/23/2021   Anoxic brain injury  ([place]) 01/10/2021   Prediabetes    Dyslipidemia    S/P percutaneous endoscopic gastrostomy (PEG) tube placement [facility])    Status post tracheostomy ([place])    Aspiration pneumonia of both lower lobes due to gastric secretions ([place]) 01/05/2021   Acute respiratory failure ([place])    Hypoxic ischemic encephalopathy    Endotracheal tube present    Cardiac arrest ([place]) 12/02/2020   Essential hypertension 12/09/2011    Harrel Carina, MS, OTR/L 07/22/2021, 4:37 PM  [place] MAIN [facility] SERVICES [address] [place], [place], [address] Phone: [phone]   Fax:  [phone]  Name: [name] [name] MRN: [id number] Date of Birth: [birth year]

## 2021-07-22 NOTE — Therapy (Signed)
[place] MAIN [facility] SERVICES [name] [place], [place], [address] Phone: [phone]   Fax:  [phone]  Speech Language Pathology Treatment  Patient Details  Name: [name] [name] MRN: [id number] Date of Birth: [birth year] Referring Provider (SLP): [place]e [place], [place]   Encounter Date: 07/22/2021   End of Session - 07/22/21 1629     Visit Number 35    Number of Visits 17    Date for SLP Re-Evaluation 08/21/21    Authorization Type Self Pay    Authorization Time Period 05/28/2021 thru 08/21/2021    Authorization - Visit Number 5    Progress Note Due on Visit 10    SLP Start Time 1500    SLP Stop Time  1600    SLP Time Calculation (min) 60 min    Activity Tolerance Patient tolerated treatment well             Past Medical History:  Diagnosis Date   Anoxic brain injury ([place])    Cardiac arrest ([place])    Cognitive communication deficit    Hyperlipidemia    Hypertension    Seizures ([place])    Ventricular fibrillation ([place])     Past Surgical History:  Procedure Laterality Date   PEG PLACEMENT N/A 12/13/2020   Procedure: PERCUTANEOUS ENDOSCOPIC GASTROSTOMY (PEG) PLACEMENT;  Surgeon: Lesly Rubenstein, MD;  Location: [facility] ENDOSCOPY;  Service: Endoscopy;  Laterality: N/A;   TRACHEOSTOMY TUBE PLACEMENT N/A 12/13/2020   Procedure: TRACHEOSTOMY;  Surgeon: Clyde Canterbury, MD;  Location: [facility] ORS;  Service: ENT;  Laterality: N/A;    There were no vitals filed for this visit.   Subjective Assessment - 07/22/21 1621     Subjective "I am working on my vision"    Currently in Pain? No/denies                   ADULT SLP TREATMENT - 07/22/21 0001       Treatment Provided   Treatment provided Cognitive-Linquistic      Cognitive-Linquistic Treatment   Treatment focused on Cognition;Patient/family/caregiver education    Skilled Treatment COGNITION: Minimal assistance to create strategies to target barriers within daily  living; Minimal assistance faded to supervision cues for semi-complex problem solving, scanning, impulsivity              SLP Education - 07/22/21 1628     Education Details steps to increase pt's confidence despite cognitive communication deficits    Person(s) Educated Patient    Methods Explanation;Demonstration;Verbal cues    Comprehension Verbalized understanding;Need further instruction              SLP Short Term Goals - 05/29/21 1536       SLP SHORT TERM GOAL #1   Title Pt will independently demonstrate selective attention in mildly distracting environment for 15 minutes.    Status Achieved      SLP SHORT TERM GOAL #2   Title With moderate cues, pt will utilize speech intelligibility strategies to achieve > 90% intelligibility at the sentence level.    Baseline Goal met, revised    Time 10    Period --   sessions   Status Revised      SLP SHORT TERM GOAL #3   Title With minimal cues, pt will utilize compensatory memory strategies to recall therapy sessions for week as well as order of therapy in 8 out of 10 sessions.    Baseline goal met, goal upgraded    Time  10    Period --   sessions   Status Revised      SLP SHORT TERM GOAL #4   Title With moderate assistance, pt will complete basic problem solving tasks with >95% accuracy.    Time 10    Period --   sessions   Status On-going              SLP Long Term Goals - 05/29/21 1538       SLP LONG TERM GOAL #2   Title Pt will independently complete functional problem solving for basic and familiar tasks.    Time 12    Period Weeks    Status On-going    Target Date 08/21/21      SLP LONG TERM GOAL #3   Title Pt will demonstrate selective attention in a moderately distracting environment for 15 minutes with < 1 redirection to task.    Time 12    Period Weeks    Status On-going    Target Date 08/21/21      SLP LONG TERM GOAL #4   Title With minimal cues, pt will increase speech intelligibility to  > 75% at the conversation level.    Baseline goal met, goal updated    Time 12    Period Weeks    Status Revised    Target Date 08/21/21              Plan - 07/22/21 1629     Clinical Impression Statement Pt continues to present with moderate to severe cognitive communication deficits related to anoxic brain injury. These deficits appear to result in decreased confidence in herself. Skilled ST intervention is required to target pt's cognitive communication deficits to increase her functional independence and her confidence in herself within her extended family, the community, work and leisure activities.    Speech Therapy Frequency 2x / week    Duration 12 weeks    Treatment/Interventions Cognitive reorganization;Compensatory techniques;Internal/external aids;SLP instruction and feedback;Patient/family education;Functional tasks;Compensatory strategies    Potential to Achieve Goals Good    Potential Considerations Severity of impairments;Ability to learn/carryover information    Consulted and Agree with Plan of Care Patient             Patient will benefit from skilled therapeutic intervention in order to improve the following deficits and impairments:   Cognitive communication deficit  Anoxic brain injury [facility])    Problem List Patient Active Problem List   Diagnosis Date Noted   Multiple lung nodules 03/27/2021   Dysarthria and anarthria 02/11/2021   Sinus tachycardia    Labile blood glucose    Dysphagia    Seizure prophylaxis    Malnutrition of moderate degree 01/23/2021   Anoxic brain injury ([place]) 01/10/2021   Prediabetes    Dyslipidemia    S/P percutaneous endoscopic gastrostomy (PEG) tube placement [facility])    Status post tracheostomy ([place])    Aspiration pneumonia of both lower lobes due to gastric secretions ([place]) 01/05/2021   Acute respiratory failure ([facility])    Hypoxic ischemic encephalopathy    Endotracheal tube present    Cardiac arrest ([place]) 12/02/2020    Essential hypertension 12/09/2011   Orbin Mayeux B. Rutherford Nail M.S., CCC-SLP, [place] Pathologist Rehabilitation Services Office [phone]  Stormy Fabian 07/22/2021, 4:30 PM  [place] MAIN [facility] SERVICES [address] [place], [place], [address] Phone: [phone]   Fax:  [phone]   Name: ANAKA BEAZER MRN: [id number] Date of Birth: 04-02-1966

## 2021-07-24 ENCOUNTER — Ambulatory Visit: Payer: Self-pay | Admitting: Occupational Therapy

## 2021-07-24 ENCOUNTER — Other Ambulatory Visit: Payer: Self-pay

## 2021-07-24 ENCOUNTER — Encounter: Payer: Self-pay | Admitting: Occupational Therapy

## 2021-07-24 ENCOUNTER — Ambulatory Visit: Payer: Self-pay | Attending: Physician Assistant | Admitting: Speech Pathology

## 2021-07-24 DIAGNOSIS — R278 Other lack of coordination: Secondary | ICD-10-CM

## 2021-07-24 DIAGNOSIS — R41841 Cognitive communication deficit: Secondary | ICD-10-CM | POA: Insufficient documentation

## 2021-07-24 DIAGNOSIS — R471 Dysarthria and anarthria: Secondary | ICD-10-CM | POA: Insufficient documentation

## 2021-07-24 DIAGNOSIS — M6281 Muscle weakness (generalized): Secondary | ICD-10-CM

## 2021-07-24 DIAGNOSIS — G931 Anoxic brain damage, not elsewhere classified: Secondary | ICD-10-CM | POA: Insufficient documentation

## 2021-07-24 NOTE — Therapy (Signed)
[place] MAIN [facility] SERVICES [address] [place], [place], [address] Phone: [phone]   Fax:  [phone]  Occupational Therapy Progress Note  Dates of reporting period  06/16/2021   to   07/24/2021   Patient Details  Name: [name] [name] MRN: [id number] Date of Birth: Aug 26, 1966 No data recorded  Encounter Date: 07/24/2021   OT End of Session - 07/24/21 1705     Visit Number 30    Number of Visits 48    Date for OT Re-Evaluation 08/21/21    Authorization Type Progress reports period starting 07/24/2021    OT Start Time 13    OT Stop Time 1645    OT Time Calculation (min) 45 min    Activity Tolerance Patient tolerated treatment well    Behavior During Therapy [facility] for tasks assessed/performed             Past Medical History:  Diagnosis Date   Anoxic brain injury ([place])    Cardiac arrest ([place])    Cognitive communication deficit    Hyperlipidemia    Hypertension    Seizures ([place])    Ventricular fibrillation ([place])     Past Surgical History:  Procedure Laterality Date   PEG PLACEMENT N/A 12/13/2020   Procedure: PERCUTANEOUS ENDOSCOPIC GASTROSTOMY (PEG) PLACEMENT;  Surgeon: Lesly Rubenstein, MD;  Location: [facility] ENDOSCOPY;  Service: Endoscopy;  Laterality: N/A;   TRACHEOSTOMY TUBE PLACEMENT N/A 12/13/2020   Procedure: TRACHEOSTOMY;  Surgeon: Clyde Canterbury, MD;  Location: [facility] ORS;  Service: ENT;  Laterality: N/A;    There were no vitals filed for this visit.   Subjective Assessment - 07/24/21 1705     Subjective  Pt. reports that she is doing well today.    Patient is accompanied by: Family member    Pertinent History Pt. is a 55 y.o. female who went into cardiac arrest, and was admitted to [facility] on 12/02/2020. Pt. was diagnosed with Anoxic Brain Injury following the Cardiac Arrest. When medically stable, the pt. was tranferred to [place] rehab. The patient was discharged home on 02/08/2021, and is ready to begin  outpatient OT services. Pt. was independent, and working prior to the onset. Pt. Has very supportive family. Pt. Enjoys gardening.    Currently in Pain? No/denies                [facility] OT Assessment - 07/24/21 1602       Coordination   Right 9 Hole Peg Test 29    Left 9 Hole Peg Test 32      AROM   Overall AROM Comments Right shoulder flexion 145, abduction: 120  left shoulder flexion 155 abduction: 125      Strength   Overall Strength Comments BUE strength 4+/5      Hand Function   Right Hand Grip (lbs) 29#    Right Hand Lateral Pinch 10 lbs    Right Hand 3 Point Pinch 11 lbs    Left Hand Grip (lbs) 33#    Left Hand Lateral Pinch 12 lbs    Left 3 point pinch 11 lbs            OT TREATMENT     Neuro muscular re-education:  Pt. performed [place] tasks using the Grooved pegboard. Pt. worked on grasping the grooved pegs from a horizontal position, and moving the pegs to a vertical position in the hand to prepare for placing them in the grooved slot. Pt. Worked on  translatory movements of the right hand, moving them through her hand from her palm to the tip of her 2nd digit, and thumb. Pt. Was able to turn the pegs with the tips of her fingers, however dropped multiple pegs when attempting to perform translatory movements.  Measurements were obtained, and goals were reviewed with the pt. Pt. has made progress with BUE ROM, and strength for functional reaching. Pt. has progressed with bilateral pinch strength. Pt. is able to open containers, and jars however has difficulty opening soda bottles, and bottle tops. Pt. is now able to prepare light cold meal items in preparation for cooking, and meal prep. Pt. is performing most home management, house cleaning tasks, and laundry. Pt. has improved with writing legibility, however continues to present with visual perceptual/spatial impairments with make spacing between between words inconsistent.  Pt. Continues to work on improving UE  functioning, and visual perceptual, and spatial skills in order to work towards improving, and maximizing independence with ADLs, and IADLs.                  OT Education - 07/24/21 1705     Education Details goals, measurments    Person(s) Educated Patient    Methods Explanation;Demonstration    Comprehension Verbalized understanding;Returned demonstration                 OT Long Term Goals - 07/24/21 1617       OT LONG TERM GOAL #1   Title Pt. will be able to independently sign her name with 100% legibility.    Baseline Pt. continues to present with limited legibility formulating her signature with inconsistent spacing between her first, and last name. Eval: Signature not legible, however was able to sign on the line with no deviation above, or below the line.  Recert: Signature with 75% legibility. Spacing words on a line is impaired and requires extensive cueing to scan L to R across the paper. 20th visit: 75% legible printed, 50% signature.    Time 12    Period Weeks    Status On-going    Target Date 08/21/21      OT LONG TERM GOAL #2   Title .      OT LONG TERM GOAL #3   Title Pt. will increase right grip strength by 5# to be able hold a full glass    Baseline 07/24/2021:  Pt. is making progress with opening containers., however has difficulty opening soda bottles. Pt. is improving with formulating a composite fist. Eval: limited grip strength. Limited bilateral digit flexion, unable to make a full composite fist. 20th visit: Pt. is making excellent progress with bilateral grip strengt. Pt. is able to hold, a d drink from a full glass.    Time 12    Period Weeks    Status Revised    Target Date 08/21/21      OT LONG TERM GOAL #4   Title Pt. will independently access a computer, and type a one sentence email correspondence.    Baseline 30th visit: Pt. is now starting to send work related emails, that her daughter reviews before sending..20th visit: Pt.  continues to have difficulty tying a sentence leggibly. Pt. is able to type a one sentence correspondence with multiple mistypes. Eval: Pt. is unable to to perform    Time 12    Period Weeks    Status On-going    Target Date 08/21/21      OT LONG TERM GOAL #5  Title Pt. will improve bilateral [facility] skills to be able to grasp, and manipulate small objects during ADLs.    Baseline 30th visit: Pt. continues to present with  [facility] limitiations dropping multiple small objects from her hand during translatory movements of the hand. 20th visit: Pt. has made excellent progress with bialteral [facility] skills, and grasping small objects. Pt. continues to have difficulty grasping, and manipulating small objects. Eval: Pt. presents with limited [facility] skills with difficulty picking,and manipulating small objects.    Time 12    Period Weeks    Status On-going    Target Date 08/21/21      OT LONG TERM GOAL #6   Title Pt. will improve UE strength by 2 mm grades to assist with ADLs, and IADLS,    Baseline [id number] visit: BUE strength: 4+/5 20th visit: BUE strength; 4+/5 overall. Pt. continues to present with limited BUE stregth. Pt. is engaging in more ADL,a nd IADL tasks at home. Eval: pt. has difficulty performing.    Time 12    Period Weeks    Status On-going      OT LONG TERM GOAL #7   Title Pt. will complete light meal preparation with minA    Baseline 30th visit: Pt. is able to prepare  items for cooking, and make cold light meals, sandwiches. Pt. does not use the stovetop or oven.  06/16/2021: Pt. is assisting her daughter with cooking chiscken, bacon, slamon, and grits. Pt. is preparing meals with her daughter's assist. Eval: Pt. is unable to prepare light meal preparation.    Time 12    Period Weeks    Status On-going    Target Date 08/21/21      OT LONG TERM GOAL #8   Title Pt. will perform light home management tasks with minA    Baseline 30th visit: pt. continues to participate in more  home management  tasks. 06/16/2021: Pt. is independent with bedmaking, vacuuming, dishes, dusting, sweeping the porch. Pt. is independent with her, and her husband's laundry. Pt.'s daughter helps her down the outdoor steps [place] of the stones. Pt. is engaging in more home management tasks. Eval: pt. is unable to perform    Time 12    Period Weeks    Status On-going    Target Date 08/21/21      OT LONG TERM GOAL  #9   TITLE Pt will demonstrate improved visual/spatial skills as demonstrated by ability to adequately space words in a sentence with min vc.    Baseline 30th visit: Pt. continues to require cues, and assist for spacing between words when formulating sentences.  20th visit: Pt. requires cues, and assist for spacing when writing sentences.    Time 12    Period Weeks    Status On-going    Target Date 08/21/21                   Plan - 07/24/21 1703     Clinical Impression Statement Measurements were obtained, and goals were reviewed with the pt. Pt. has made progress with BUE ROM, and strength for functional reaching. Pt. has progressed with bilateral pinch strength. Pt. is able to open containers, and jars however has difficulty opening soda bottles, and bottle tops. Pt. is now able to prepare light cold meal items in preparation for cooking, and meal prep. Pt. is performing most moe management, house cleaning tasks, and laundry. Pt. has improved with writing legibility, however continues to present with visual perceptual/spatial impairments  with make spacing between between words inconsistent.  Pt. Continues to work on improving UE functioning, and visual perceptual, and spatial skills in order to work towards improving, and maximizing independence with ADLs, and IADLs.     OT Occupational Profile and History Problem Focused Assessment - Including review of records relating to presenting problem    Occupational performance deficits (Please refer to evaluation for details): ADL's;IADL's    Body  Structure / Function / Physical Skills ADL;IADL;FMC;ROM;Strength    Rehab Potential Good    Clinical Decision Making Several treatment options, min-mod task modification necessary    Comorbidities Affecting Occupational Performance: May have comorbidities impacting occupational performance    Modification or Assistance to Complete Evaluation  Min-Moderate modification of tasks or assist with assess necessary to complete eval    OT Frequency 2x / week    OT Duration 12 weeks    OT Treatment/Interventions Self-care/ADL training;Neuromuscular education;Therapeutic activities;Patient/family education;DME and/or AE instruction;Visual/perceptual remediation/compensation    Consulted and Agree with Plan of Care Patient             Patient will benefit from skilled therapeutic intervention in order to improve the following deficits and impairments:   Body Structure / Function / Physical Skills: ADL, IADL, FMC, ROM, Strength       Visit Diagnosis: Muscle weakness (generalized)  Other lack of coordination    Problem List Patient Active Problem List   Diagnosis Date Noted   Multiple lung nodules 03/27/2021   Dysarthria and anarthria 02/11/2021   Sinus tachycardia    Labile blood glucose    Dysphagia    Seizure prophylaxis    Malnutrition of moderate degree 01/23/2021   Anoxic brain injury ([place]) 01/10/2021   Prediabetes    Dyslipidemia    S/P percutaneous endoscopic gastrostomy (PEG) tube placement ([place])    Status post tracheostomy ([place])    Aspiration pneumonia of both lower lobes due to gastric secretions ([place]) 01/05/2021   Acute respiratory failure ([place])    Hypoxic ischemic encephalopathy    Endotracheal tube present    Cardiac arrest ([place]) 12/02/2020   Essential hypertension 12/09/2011    Harrel Carina, MS, OTR/L 07/24/2021, 5:07 PM  [place] MAIN [facility] SERVICES [address] [place], [place], [address] Phone: [phone]    Fax:  [phone]  Name: [name] [name] MRN: [id number] Date of Birth: 07/13/1966

## 2021-07-25 NOTE — Patient Instructions (Signed)
Complete levels thru 50 on parking app

## 2021-07-25 NOTE — Therapy (Signed)
[place] MAIN [facility] SERVICES [address] [place], [place], [address] Phone: [phone]   Fax:  [phone]  Speech Language Pathology Treatment  Patient Details  Name: [name] [name] MRN: [id number] Date of Birth: Jul 26, 1966 Referring Provider (SLP): Marlowe Shores, [place]   Encounter Date: 07/24/2021   End of Session - 07/25/21 1130     Visit Number 4    Number of Visits 6    Date for SLP Re-Evaluation 08/21/21    Authorization Type Self Pay    Authorization Time Period 05/28/2021 thru 08/21/2021    Authorization - Visit Number 6    Progress Note Due on Visit 10    SLP Start Time 1500    SLP Stop Time  1600    SLP Time Calculation (min) 60 min    Activity Tolerance Patient tolerated treatment well             Past Medical History:  Diagnosis Date   Anoxic brain injury ([place])    Cardiac arrest ([place])    Cognitive communication deficit    Hyperlipidemia    Hypertension    Seizures ([place])    Ventricular fibrillation ([place])     Past Surgical History:  Procedure Laterality Date   PEG PLACEMENT N/A 12/13/2020   Procedure: PERCUTANEOUS ENDOSCOPIC GASTROSTOMY (PEG) PLACEMENT;  Surgeon: Lesly Rubenstein, MD;  Location: [facility] ENDOSCOPY;  Service: Endoscopy;  Laterality: N/A;   TRACHEOSTOMY TUBE PLACEMENT N/A 12/13/2020   Procedure: TRACHEOSTOMY;  Surgeon: Clyde Canterbury, MD;  Location: [facility] ORS;  Service: ENT;  Laterality: N/A;    There were no vitals filed for this visit.   Subjective Assessment - 07/25/21 1128     Subjective "I am making progress towards the person that I want to be"    Currently in Pain? No/denies                   ADULT SLP TREATMENT - 07/25/21 0001       Treatment Provided   Treatment provided Cognitive-Linquistic      Cognitive-Linquistic Treatment   Treatment focused on Cognition;Patient/family/caregiver education    Skilled Treatment COGNITION: Minimal assistance faded to supervsion cues to  problem solve basic to semi-complex problem solving task using apps on pt's ipad (parking and alike/differences)              SLP Education - 07/25/21 1130     Education Details steps to increase pt's confidence despite cognitive communication deficits    Person(s) Educated Patient    Methods Explanation;Demonstration;Verbal cues    Comprehension Verbalized understanding;Need further instruction              SLP Short Term Goals - 05/29/21 1536       SLP SHORT TERM GOAL #1   Title Pt will independently demonstrate selective attention in mildly distracting environment for 15 minutes.    Status Achieved      SLP SHORT TERM GOAL #2   Title With moderate cues, pt will utilize speech intelligibility strategies to achieve > 90% intelligibility at the sentence level.    Baseline Goal met, revised    Time 10    Period --   sessions   Status Revised      SLP SHORT TERM GOAL #3   Title With minimal cues, pt will utilize compensatory memory strategies to recall therapy sessions for week as well as order of therapy in 8 out of 10 sessions.    Baseline goal met,  goal upgraded    Time 10    Period --   sessions   Status Revised      SLP SHORT TERM GOAL #4   Title With moderate assistance, pt will complete basic problem solving tasks with >95% accuracy.    Time 10    Period --   sessions   Status On-going              SLP Long Term Goals - 05/29/21 1538       SLP LONG TERM GOAL #2   Title Pt will independently complete functional problem solving for basic and familiar tasks.    Time 12    Period Weeks    Status On-going    Target Date 08/21/21      SLP LONG TERM GOAL #3   Title Pt will demonstrate selective attention in a moderately distracting environment for 15 minutes with < 1 redirection to task.    Time 12    Period Weeks    Status On-going    Target Date 08/21/21      SLP LONG TERM GOAL #4   Title With minimal cues, pt will increase speech  intelligibility to > 75% at the conversation level.    Baseline goal met, goal updated    Time 12    Period Weeks    Status Revised    Target Date 08/21/21              Plan - 07/25/21 1131     Clinical Impression Statement Pt continues to present with moderate cognitive communication deficits related to anoxic brain injury. These deficits appear to result in decreased confidence in herself. Skilled ST intervention is required to target pt's cognitive communication deficits to increase her functional independence and her confidence in herself within her extended family, the community, work and leisure activities.    Speech Therapy Frequency 2x / week    Duration 12 weeks    Treatment/Interventions Cognitive reorganization;Compensatory techniques;Internal/external aids;SLP instruction and feedback;Patient/family education;Functional tasks;Compensatory strategies    Potential to Achieve Goals Good    Potential Considerations Severity of impairments;Ability to learn/carryover information    SLP Home Exercise Plan provided, see instruction section    Consulted and Agree with Plan of Care Patient             Patient will benefit from skilled therapeutic intervention in order to improve the following deficits and impairments:   Cognitive communication deficit  Anoxic brain injury [facility])    Problem List Patient Active Problem List   Diagnosis Date Noted   Multiple lung nodules 03/27/2021   Dysarthria and anarthria 02/11/2021   Sinus tachycardia    Labile blood glucose    Dysphagia    Seizure prophylaxis    Malnutrition of moderate degree 01/23/2021   Anoxic brain injury ([place]) 01/10/2021   Prediabetes    Dyslipidemia    S/P percutaneous endoscopic gastrostomy (PEG) tube placement [facility])    Status post tracheostomy ([place])    Aspiration pneumonia of both lower lobes due to gastric secretions ([place]) 01/05/2021   Acute respiratory failure (HCC)    Hypoxic ischemic encephalopathy     Endotracheal tube present    Cardiac arrest ([place]) 12/02/2020   Essential hypertension 12/09/2011   Tejasvi Brissett B. Rutherford Nail M.S., CCC-SLP, [place] Pathologist Rehabilitation Services Office [phone]  Stormy Fabian 07/25/2021, 11:35 AM  [place] MAIN [facility] SERVICES [address] [place], [place], [address] Phone: [phone]   Fax:  [phone]   Name: ANGELIAH WISDOM MRN: [id number] Date of Birth: 1966-07-23

## 2021-07-29 ENCOUNTER — Other Ambulatory Visit: Payer: Self-pay

## 2021-07-29 ENCOUNTER — Ambulatory Visit: Payer: Self-pay | Admitting: Occupational Therapy

## 2021-07-29 ENCOUNTER — Telehealth: Payer: Self-pay | Admitting: *Deleted

## 2021-07-29 ENCOUNTER — Ambulatory Visit: Payer: Self-pay | Admitting: Speech Pathology

## 2021-07-29 ENCOUNTER — Encounter: Payer: Self-pay | Admitting: Occupational Therapy

## 2021-07-29 DIAGNOSIS — R41841 Cognitive communication deficit: Secondary | ICD-10-CM

## 2021-07-29 DIAGNOSIS — M6281 Muscle weakness (generalized): Secondary | ICD-10-CM

## 2021-07-29 DIAGNOSIS — R911 Solitary pulmonary nodule: Secondary | ICD-10-CM

## 2021-07-29 DIAGNOSIS — G931 Anoxic brain damage, not elsewhere classified: Secondary | ICD-10-CM

## 2021-07-29 DIAGNOSIS — R278 Other lack of coordination: Secondary | ICD-10-CM

## 2021-07-29 NOTE — Telephone Encounter (Signed)
Message left with pt' spouse, Marguerite Olea, to schedule pt for follow up CT scan and follow up appt in the lung nodule clinic. Awaiting call back.

## 2021-07-29 NOTE — Therapy (Signed)
[place] MAIN [facility] SERVICES [address] [place], [place], [address] Phone: [phone]   Fax:  [phone]  Speech Language Pathology Treatment  Patient Details  Name: [name] [name] MRN: [id number] Date of Birth: 1966-10-07 Referring Provider (SLP): Marlowe Sh[place], [place]   Encounter Date: 07/29/2021   End of Session - 07/29/21 1650     Visit Number 37    Number of Visits 72    Date for SLP Re-Evaluation 08/21/21    Authorization Type Self Pay    Authorization Time Period 05/28/2021 thru 08/21/2021    Authorization - Visit Number 7    Progress Note Due on Visit 10    SLP Start Time 1500    SLP Stop Time  1600    SLP Time Calculation (min) 60 min    Activity Tolerance Patient tolerated treatment well             Past Medical History:  Diagnosis Date   Anoxic brain injury ([place])    Cardiac arrest ([place])    Cognitive communication deficit    Hyperlipidemia    Hypertension    Seizures ([place])    Ventricular fibrillation ([place])     Past Surgical History:  Procedure Laterality Date   PEG PLACEMENT N/A 12/13/2020   Procedure: PERCUTANEOUS ENDOSCOPIC GASTROSTOMY (PEG) PLACEMENT;  Surgeon: Lesly Rubenstein, MD;  Location: [facility] ENDOSCOPY;  Service: Endoscopy;  Laterality: N/A;   TRACHEOSTOMY TUBE PLACEMENT N/A 12/13/2020   Procedure: TRACHEOSTOMY;  Surgeon: Clyde Canterbury, MD;  Location: [facility] ORS;  Service: ENT;  Laterality: N/A;    There were no vitals filed for this visit.   Subjective Assessment - 07/29/21 1649     Subjective "our anniversary was really good"    Currently in Pain? No/denies                   ADULT SLP TREATMENT - 07/29/21 0001       Treatment Provided   Treatment provided Cognitive-Linquistic      Cognitive-Linquistic Treatment   Treatment focused on Cognition;Patient/family/caregiver education    Skilled Treatment COGNITION: pt reports targeting 2 activities that are directly aligned with  becoming the "vision" that she has for herself; maximal cues to decrease impulsivity when completing directional semi-complex activity on iPad ([employer]); moderate assistance to complete levels 20thru22 d/t impulsivity and missed directional movement; maximal multimodal assistance to complete scanning task from left to right              SLP Education - 07/29/21 1650     Education Details controlling impulsivity    Person(s) Educated Patient    Methods Explanation;Demonstration;Verbal cues    Comprehension Verbalized understanding;Need further instruction;Verbal cues required;Tactile cues required              SLP Short Term Goals - 05/29/21 1536       SLP SHORT TERM GOAL #1   Title Pt will independently demonstrate selective attention in mildly distracting environment for 15 minutes.    Status Achieved      SLP SHORT TERM GOAL #2   Title With moderate cues, pt will utilize speech intelligibility strategies to achieve > 90% intelligibility at the sentence level.    Baseline Goal met, revised    Time 10    Period --   sessions   Status Revised      SLP SHORT TERM GOAL #3   Title With minimal cues, pt will utilize compensatory memory strategies to recall  therapy sessions for week as well as order of therapy in 8 out of 10 sessions.    Baseline goal met, goal upgraded    Time 10    Period --   sessions   Status Revised      SLP SHORT TERM GOAL #4   Title With moderate assistance, pt will complete basic problem solving tasks with >95% accuracy.    Time 10    Period --   sessions   Status On-going              SLP Long Term Goals - 05/29/21 1538       SLP LONG TERM GOAL #2   Title Pt will independently complete functional problem solving for basic and familiar tasks.    Time 12    Period Weeks    Status On-going    Target Date 08/21/21      SLP LONG TERM GOAL #3   Title Pt will demonstrate selective attention in a moderately distracting environment for 15  minutes with < 1 redirection to task.    Time 12    Period Weeks    Status On-going    Target Date 08/21/21      SLP LONG TERM GOAL #4   Title With minimal cues, pt will increase speech intelligibility to > 75% at the conversation level.    Baseline goal met, goal updated    Time 12    Period Weeks    Status Revised    Target Date 08/21/21              Plan - 07/29/21 1651     Clinical Impression Statement Pt says that she is more confident and lists several tasks that she has been more confident to perform that align with her vision of herself. While pt is beginning to feel more confident about herself, this quickly fades when she struggled with today's activities. During today's session, pt was more impulsive and therefore had more difficulty completing tasks accurately. As a result, she required moderate assistance for accuracy across all activities. Skilled St intervention continues to be required to target pt's moderate cognitive communication impairment to increase her functional independence and reduce caregiver burden.    Speech Therapy Frequency 2x / week    Duration 12 weeks    Treatment/Interventions Cognitive reorganization;Compensatory techniques;Internal/external aids;SLP instruction and feedback;Patient/family education;Functional tasks;Compensatory strategies    Potential to Achieve Goals Good    Potential Considerations Severity of impairments;Ability to learn/carryover information    Consulted and Agree with Plan of Care Patient             Patient will benefit from skilled therapeutic intervention in order to improve the following deficits and impairments:   Cognitive communication deficit  Anoxic brain injury [facility])    Problem List Patient Active Problem List   Diagnosis Date Noted   Multiple lung nodules 03/27/2021   Dysarthria and anarthria 02/11/2021   Sinus tachycardia    Labile blood glucose    Dysphagia    Seizure prophylaxis     Malnutrition of moderate degree 01/23/2021   Anoxic brain injury ([place]) 01/10/2021   Prediabetes    Dyslipidemia    S/P percutaneous endoscopic gastrostomy (PEG) tube placement [facility])    Status post tracheostomy ([place])    Aspiration pneumonia of both lower lobes due to gastric secretions ([place]) 01/05/2021   Acute respiratory failure (HCC)    Hypoxic ischemic encephalopathy    Endotracheal tube present  Cardiac arrest ([place]) 12/02/2020   Essential hypertension 12/09/2011   Eulene Pekar B. Rutherford Nail M.S., CCC-SLP, [place] Pathologist Rehabilitation Services Office [phone]  Stormy Fabian 07/29/2021, 4:52 PM  [place] MAIN [facility] SERVICES [address] [place], [place], [address] Phone: [phone]   Fax:  [phone]   Name: [name] [name] MRN: [id number] Date of Birth: [birth year]

## 2021-07-29 NOTE — Therapy (Signed)
[place] MAIN [facility] SERVICES [address] [place], [place], [address] Phone: [phone]   Fax:  [phone]  Occupational Therapy Treatment  Patient Details  Name: [name] [name] MRN: [id number] Date of Birth: 18-Jul-1966 No data recorded  Encounter Date: 07/29/2021   OT End of Session - 07/29/21 1615     Visit Number 31    Number of Visits 3    Date for OT Re-Evaluation 08/21/21    Authorization Type Progress reports period starting 02/11/2021    OT Start Time 1605    OT Stop Time 1645    OT Time Calculation (min) 40 min    Activity Tolerance Patient tolerated treatment well    Behavior During Therapy WFL for tasks assessed/performed             Past Medical History:  Diagnosis Date   Anoxic brain injury ([place])    Cardiac arrest ([place])    Cognitive communication deficit    Hyperlipidemia    Hypertension    Seizures ([place])    Ventricular fibrillation ([place])     Past Surgical History:  Procedure Laterality Date   PEG PLACEMENT N/A 12/13/2020   Procedure: PERCUTANEOUS ENDOSCOPIC GASTROSTOMY (PEG) PLACEMENT;  Surgeon: Lesly Rubenstein, MD;  Location: [facility] ENDOSCOPY;  Service: Endoscopy;  Laterality: N/A;   TRACHEOSTOMY TUBE PLACEMENT N/A 12/13/2020   Procedure: TRACHEOSTOMY;  Surgeon: Clyde Canterbury, MD;  Location: [facility] ORS;  Service: ENT;  Laterality: N/A;    There were no vitals filed for this visit.   Subjective Assessment - 07/29/21 1615     Subjective  Pt. reports that she is doing well today.    Patient is accompanied by: Family member    Pertinent History Pt. is a 55 y.o. female who went into cardiac arrest, and was admitted to [facility] on 12/02/2020. Pt. was diagnosed with Anoxic Brain Injury following the Cardiac Arrest. When medically stable, the pt. was tranferred to [facility]. The patient was discharged home on 02/08/2021, and is ready to begin outpatient OT services. Pt. was independent, and working prior  to the onset. Pt. Has very supportive family. Pt. Enjoys gardening.    Currently in Pain? No/denies            OT TREATMENT    Therapeutic Activities:   Pt. worked on manipulating small 1/2" mini lightbrite pegs with her right hand, and pressing them through the resistive board following simple design patterns.   Pt. was able to grasp the mini pegs from a dish of multiple pegs to pick out the appropriate color. Pt. dropped several pegs  Pt. was able to follow the design pattern, however required cues for accuracy for the right side of the design pattern. Pt. was able initiate corrections when cues were provided. Pt. continues to work on improving UE strength, [place], and visual perceptual skills in order to work towards improving, and maximizing independence with ADLs, and IADL tasks.                     OT Education - 07/29/21 1615     Education Details goals, measurments    Person(s) Educated Patient    Methods Explanation;Demonstration    Comprehension Verbalized understanding;Returned demonstration                 OT Long Term Goals - 07/24/21 1617       OT LONG TERM GOAL #1   Title Pt. will be able  to independently sign her name with 100% legibility.    Baseline Pt. continues to present with limited legibility formulating her signature with inconsistent spacing between her first, and last name. Eval: Signature not legible, however was able to sign on the line with no deviation above, or below the line.  Recert: Signature with 75% legibility. Spacing words on a line is impaired and requires extensive cueing to scan L to R across the paper. 20th visit: 75% legible printed, 50% signature.    Time 12    Period Weeks    Status On-going    Target Date 08/21/21      OT LONG TERM GOAL #2   Title .      OT LONG TERM GOAL #3   Title Pt. will increase right grip strength by 5# to be able hold a full glass    Baseline 07/24/2021:  Pt. is making progress with  opening containers., however has difficulty opening soda bottles. Pt. is improving with formulating a composite fist. Eval: limited grip strength. Limited bilateral digit flexion, unable to make a full composite fist. 20th visit: Pt. is making excellent progress with bilateral grip strengt. Pt. is able to hold, a d drink from a full glass.    Time 12    Period Weeks    Status Revised    Target Date 08/21/21      OT LONG TERM GOAL #4   Title Pt. will independently access a computer, and type a one sentence email correspondence.    Baseline 30th visit: Pt. is now starting to send work related emails, that her daughter reviews before sending..20th visit: Pt. continues to have difficulty tying a sentence leggibly. Pt. is able to type a one sentence correspondence with multiple mistypes. Eval: Pt. is unable to to perform    Time 12    Period Weeks    Status On-going    Target Date 08/21/21      OT LONG TERM GOAL #5   Title Pt. will improve bilateral [facility] skills to be able to grasp, and manipulate small objects during ADLs.    Baseline 30th visit: Pt. continues to present with  [facility] limitiations dropping multiple small objects from her hand during translatory movements of the hand. 20th visit: Pt. has made excellent progress with bialteral [facility] skills, and grasping small objects. Pt. continues to have difficulty grasping, and manipulating small objects. Eval: Pt. presents with limited [facility] skills with difficulty picking,and manipulating small objects.    Time 12    Period Weeks    Status On-going    Target Date 08/21/21      OT LONG TERM GOAL #6   Title Pt. will improve UE strength by 2 mm grades to assist with ADLs, and IADLS,    Baseline [id number] visit: BUE strength: 4+/5 20th visit: BUE strength; 4+/5 overall. Pt. continues to present with limited BUE stregth. Pt. is engaging in more ADL,a nd IADL tasks at home. Eval: pt. has difficulty performing.    Time 12    Period Weeks    Status On-going       OT LONG TERM GOAL #7   Title Pt. will complete light meal preparation with minA    Baseline 30th visit: Pt. is able to prepare  items for cooking, and make cold light meals, sandwiches. Pt. does not use the stovetop or oven.  06/16/2021: Pt. is assisting her daughter with cooking chiscken, bacon, slamon, and grits. Pt. is preparing meals with her daughter's  assist. Eval: Pt. is unable to prepare light meal preparation.    Time 12    Period Weeks    Status On-going    Target Date 08/21/21      OT LONG TERM GOAL #8   Title Pt. will perform light home management tasks with minA    Baseline 30th visit: pt. continues to participate in more  home management tasks. 06/16/2021: Pt. is independent with bedmaking, vacuuming, dishes, dusting, sweeping the porch. Pt. is independent with her, and her husband's laundry. Pt.'s daughter helps her down the outdoor steps [place] of the stones. Pt. is engaging in more home management tasks. Eval: pt. is unable to perform    Time 12    Period Weeks    Status On-going    Target Date 08/21/21      OT LONG TERM GOAL  #9   TITLE Pt will demonstrate improved visual/spatial skills as demonstrated by ability to adequately space words in a sentence with min vc.    Baseline 30th visit: Pt. continues to require cues, and assist for spacing between words when formulating sentences.  20th visit: Pt. requires cues, and assist for spacing when writing sentences.    Time 12    Period Weeks    Status On-going    Target Date 08/21/21                   Plan - 07/29/21 1616     Clinical Impression Statement Pt. was able to grasp the mini pegs from a dish of multiple pegs to pick out the appropriate color. Pt. dropped several pegs  Pt. was able to follow the design pattern, however required cues for accuracy for the right side of the design pattern. Pt. was able initiate corrections when cues were provided. Pt. continues to work on improving UE strength, [place], and visual  perceptual skills in order to work towards improving, and maximizing independence with ADLs, and IADL tasks.   OT Occupational Profile and History Problem Focused Assessment - Including review of records relating to presenting problem    Occupational performance deficits (Please refer to evaluation for details): ADL's;IADL's    Body Structure / Function / Physical Skills ADL;IADL;FMC;ROM;Strength    Rehab Potential Good    Clinical Decision Making Several treatment options, min-mod task modification necessary    Comorbidities Affecting Occupational Performance: May have comorbidities impacting occupational performance    Modification or Assistance to Complete Evaluation  Min-Moderate modification of tasks or assist with assess necessary to complete eval    OT Frequency 2x / week    OT Duration 12 weeks    OT Treatment/Interventions Self-care/ADL training;Neuromuscular education;Therapeutic activities;Patient/family education;DME and/or AE instruction;Visual/perceptual remediation/compensation    Consulted and Agree with Plan of Care Patient             Patient will benefit from skilled therapeutic intervention in order to improve the following deficits and impairments:   Body Structure / Function / Physical Skills: ADL, IADL, FMC, ROM, Strength       Visit Diagnosis: Muscle weakness (generalized)  Other lack of coordination    Problem List Patient Active Problem List   Diagnosis Date Noted   Multiple lung nodules 03/27/2021   Dysarthria and anarthria 02/11/2021   Sinus tachycardia    Labile blood glucose    Dysphagia    Seizure prophylaxis    Malnutrition of moderate degree 01/23/2021   Anoxic brain injury ([place]) 01/10/2021   Prediabetes    Dyslipidemia  S/P percutaneous endoscopic gastrostomy (PEG) tube placement [facility])    Status post tracheostomy ([place])    Aspiration pneumonia of both lower lobes due to gastric secretions ([place]) 01/05/2021   Acute respiratory failure  ([place])    Hypoxic ischemic encephalopathy    Endotracheal tube present    Cardiac arrest ([place]) 12/02/2020   Essential hypertension 12/09/2011    Harrel Carina, MS, OTR/L 07/29/2021, 4:20 PM  [place] MAIN [facility] SERVICES [address] [place], [place], [address] Phone: [phone]   Fax:  [phone]  Name: [name] [name] MRN: [id number] Date of Birth: 12/22/1965

## 2021-07-30 NOTE — Telephone Encounter (Signed)
Spoke with pt's hsuband, Darrell, who is agreeable to proceed with follow CT chest and follow up in the lung nodule clinic. Darrell reminded of pt's appt with gynonc on 08/06/21 at 2:30pm at the [facility]. Informed Darrell will call back once appts have been scheduled for the lung nodule clinic. He verbalized understanding.

## 2021-07-30 NOTE — Addendum Note (Signed)
Addended by: Telford Nab on: 07/30/2021 09:13 AM   Modules accepted: Orders

## 2021-07-31 ENCOUNTER — Ambulatory Visit: Payer: Self-pay | Admitting: Occupational Therapy

## 2021-07-31 ENCOUNTER — Encounter: Payer: Self-pay | Admitting: Occupational Therapy

## 2021-07-31 ENCOUNTER — Ambulatory Visit: Payer: Self-pay | Admitting: Speech Pathology

## 2021-07-31 ENCOUNTER — Other Ambulatory Visit: Payer: Self-pay

## 2021-07-31 DIAGNOSIS — R41841 Cognitive communication deficit: Secondary | ICD-10-CM

## 2021-07-31 DIAGNOSIS — M6281 Muscle weakness (generalized): Secondary | ICD-10-CM

## 2021-07-31 DIAGNOSIS — G931 Anoxic brain damage, not elsewhere classified: Secondary | ICD-10-CM

## 2021-07-31 NOTE — Therapy (Signed)
[place] MAIN [facility] SERVICES [address] [place], [place], [address] Phone: [phone]   Fax:  [phone]  Occupational Therapy Treatment  Patient Details  Name: [name] [name] MRN: [id number] Date of Birth: 25-Aug-1966 No data recorded  Encounter Date: 07/31/2021   OT End of Session - 07/31/21 1609     Visit Number 32    Number of Visits 48    Date for OT Re-Evaluation 08/21/21    Authorization Type Progress reports period starting 02/11/2021    OT Start Time 1604    OT Stop Time 1645    OT Time Calculation (min) 41 min    Activity Tolerance Patient tolerated treatment well    Behavior During Therapy [facility] for tasks assessed/performed             Past Medical History:  Diagnosis Date   Anoxic brain injury ([place])    Cardiac arrest ([place])    Cognitive communication deficit    Hyperlipidemia    Hypertension    Seizures ([place])    Ventricular fibrillation ([place])     Past Surgical History:  Procedure Laterality Date   PEG PLACEMENT N/A 12/13/2020   Procedure: PERCUTANEOUS ENDOSCOPIC GASTROSTOMY (PEG) PLACEMENT;  Surgeon: Lesly Rubenstein, MD;  Location: [facility] ENDOSCOPY;  Service: Endoscopy;  Laterality: N/A;   TRACHEOSTOMY TUBE PLACEMENT N/A 12/13/2020   Procedure: TRACHEOSTOMY;  Surgeon: Clyde Canterbury, MD;  Location: [facility] ORS;  Service: ENT;  Laterality: N/A;    There were no vitals filed for this visit.  OT Treatment   Subjective Assessment - 07/31/21 1718     Subjective  Pt. reports that she is doing well today.    Patient is accompanied by: Family member    Pertinent History Pt. is a 55 y.o. female who went into cardiac arrest, and was admitted to [facility] on 12/02/2020. Pt. was diagnosed with Anoxic Brain Injury following the Cardiac Arrest. When medically stable, the pt. was tranferred to [place] re[facility]. The patient was discharged home on 02/08/2021, and is ready to begin outpatient OT services. Pt. was independent,  and working prior to the onset. Pt. Has very supportive family. Pt. Enjoys gardening.    Patient Stated Goals Pt would like to be as independent as possible.    Currently in Pain? No/denies            OT Treatment  Therapeutic Activities:  Pt. worked on Fish farm manager, and [facility] sorting shapes, and Restaurant manager, fast food patterns.   Pt. was able to complete simple Parguetry design patterns, and was able to create a shape by connecting 2 design pieces to form one shape in a simple small parquetry design pattern. Pt. has difficulty with more complex abstract design patterns requiring multiple shapes to be connected.  Pt. Requires verbal cues, and cues for visual demonstration to turn the design pieces in the proper direction to fit the design patten, as well as using the appropriate size triangle to fit the design pattern. Pt. Worked on Solicitor with the pieces already connected to form the design.                         OT Education - 07/31/21 1609     Education Details goals, measurments    Person(s) Educated Patient    Methods Explanation;Demonstration    Comprehension Verbalized understanding;Returned demonstration  OT Long Term Goals - 07/24/21 1617       OT LONG TERM GOAL #1   Title Pt. will be able to independently sign her name with 100% legibility.    Baseline Pt. continues to present with limited legibility formulating her signature with inconsistent spacing between her first, and last name. Eval: Signature not legible, however was able to sign on the line with no deviation above, or below the line.  Recert: Signature with 75% legibility. Spacing words on a line is impaired and requires extensive cueing to scan L to R across the paper. 20th visit: 75% legible printed, 50% signature.    Time 12    Period Weeks    Status On-going    Target Date 08/21/21      OT LONG TERM GOAL #2   Title .      OT LONG  TERM GOAL #3   Title Pt. will increase right grip strength by 5# to be able hold a full glass    Baseline 07/24/2021:  Pt. is making progress with opening containers., however has difficulty opening soda bottles. Pt. is improving with formulating a composite fist. Eval: limited grip strength. Limited bilateral digit flexion, unable to make a full composite fist. 20th visit: Pt. is making excellent progress with bilateral grip strengt. Pt. is able to hold, a d drink from a full glass.    Time 12    Period Weeks    Status Revised    Target Date 08/21/21      OT LONG TERM GOAL #4   Title Pt. will independently access a computer, and type a one sentence email correspondence.    Baseline 30th visit: Pt. is now starting to send work related emails, that her daughter reviews before sending..20th visit: Pt. continues to have difficulty tying a sentence leggibly. Pt. is able to type a one sentence correspondence with multiple mistypes. Eval: Pt. is unable to to perform    Time 12    Period Weeks    Status On-going    Target Date 08/21/21      OT LONG TERM GOAL #5   Title Pt. will improve bilateral [facility] skills to be able to grasp, and manipulate small objects during ADLs.    Baseline 30th visit: Pt. continues to present with  [facility] limitiations dropping multiple small objects from her hand during translatory movements of the hand. 20th visit: Pt. has made excellent progress with bialteral [facility] skills, and grasping small objects. Pt. continues to have difficulty grasping, and manipulating small objects. Eval: Pt. presents with limited [facility] skills with difficulty picking,and manipulating small objects.    Time 12    Period Weeks    Status On-going    Target Date 08/21/21      OT LONG TERM GOAL #6   Title Pt. will improve UE strength by 2 mm grades to assist with ADLs, and IADLS,    Baseline [id number] visit: BUE strength: 4+/5 20th visit: BUE strength; 4+/5 overall. Pt. continues to present with limited BUE  stregth. Pt. is engaging in more ADL,a nd IADL tasks at home. Eval: pt. has difficulty performing.    Time 12    Period Weeks    Status On-going      OT LONG TERM GOAL #7   Title Pt. will complete light meal preparation with minA    Baseline 30th visit: Pt. is able to prepare  items for cooking, and make cold light meals, sandwiches. Pt. does not use  the stovetop or oven.  06/16/2021: Pt. is assisting her daughter with cooking chiscken, bacon, slamon, and grits. Pt. is preparing meals with her daughter's assist. Eval: Pt. is unable to prepare light meal preparation.    Time 12    Period Weeks    Status On-going    Target Date 08/21/21      OT LONG TERM GOAL #8   Title Pt. will perform light home management tasks with minA    Baseline 30th visit: pt. continues to participate in more  home management tasks. 06/16/2021: Pt. is independent with bedmaking, vacuuming, dishes, dusting, sweeping the porch. Pt. is independent with her, and her husband's laundry. Pt.'s daughter helps her down the outdoor steps beac[place] of the stones. Pt. is engaging in more home management tasks. Eval: pt. is unable to perform    Time 12    Period Weeks    Status On-going    Target Date 08/21/21      OT LONG TERM GOAL  #9   TITLE Pt will demonstrate improved visual/spatial skills as demonstrated by ability to adequately space words in a sentence with min vc.    Baseline 30th visit: Pt. continues to require cues, and assist for spacing between words when formulating sentences.  20th visit: Pt. requires cues, and assist for spacing when writing sentences.    Time 12    Period Weeks    Status On-going    Target Date 08/21/21                   Plan - 07/31/21 1609     Clinical Impression Statement Pt. was able to complete simple Parguetry design patterns, and was able to create a shape by connecting 2 design pieces to form one shape in a simple small parquetry design pattern. Pt. has difficulty with more  complex abstract design patterns requiring multiple shapes to be connected.  Pt. Requires verbal cues, and cues for visual demonstration to turn the design pieces in the proper direction to fit the design patten, as well as using the appropriate size triangle to fit the design pattern. Pt. Worked on Solicitor with the pieces already connected to form the design.   OT Occupational Profile and History Problem Focused Assessment - Including review of records relating to presenting problem    Occupational performance deficits (Please refer to evaluation for details): ADL's;IADL's    Body Structure / Function / Physical Skills ADL;IADL;FMC;ROM;Strength    Rehab Potential Good    Clinical Decision Making Several treatment options, min-mod task modification necessary    Comorbidities Affecting Occupational Performance: May have comorbidities impacting occupational performance    Modification or Assistance to Complete Evaluation  Min-Moderate modification of tasks or assist with assess necessary to complete eval    OT Frequency 2x / week    OT Duration 12 weeks    OT Treatment/Interventions Self-care/ADL training;Neuromuscular education;Therapeutic activities;Patient/family education;DME and/or AE instruction;Visual/perceptual remediation/compensation    Consulted and Agree with Plan of Care Patient             Patient will benefit from skilled therapeutic intervention in order to improve the following deficits and impairments:   Body Structure / Function / Physical Skills: ADL, IADL, FMC, ROM, Strength       Visit Diagnosis: Muscle weakness (generalized)  Cognitive communication deficit    Problem List Patient Active Problem List   Diagnosis Date Noted   Multiple lung nodules 03/27/2021   Dysarthria and anarthria 02/11/2021  Sinus tachycardia    Labile blood glucose    Dysphagia    Seizure prophylaxis    Malnutrition of moderate degree 01/23/2021   Anoxic brain injury ([place])  01/10/2021   Prediabetes    Dyslipidemia    S/P percutaneous endoscopic gastrostomy (PEG) tube placement [facility])    Status post tracheostomy ([place])    Aspiration pneumonia of both lower lobes due to gastric secretions ([place]) 01/05/2021   Acute respiratory failure ([place])    Hypoxic ischemic encephalopathy    Endotracheal tube present    Cardiac arrest ([place]) 12/02/2020   Essential hypertension 12/09/2011    Harrel Carina, MS, OTR/L 07/31/2021, 5:21 PM  [place] MAIN [facility] SERVICES [address] [place], [place], [address] Phone: [phone]   Fax:  [phone]  Name: [name] [name] MRN: [id number] Date of Birth: 03-17-1966

## 2021-08-02 NOTE — Patient Instructions (Signed)
Complete levels on apps found on iPad

## 2021-08-02 NOTE — Therapy (Signed)
[place] MAIN [facility] SERVICES [address] [place], [place], [address] Phone: [phone]   Fax:  [phone]  Speech Language Pathology Treatment  Patient Details  Name: [name] [name] MRN: [id number] Date of Birth: 03-26-1966 Referring Provider (SLP): Marlowe Shores, [place]   Encounter Date: 07/31/2021   End of Session - 08/02/21 1001     Visit Number 38    Number of Visits 69    Date for SLP Re-Evaluation 08/21/21    Authorization Type Self Pay    Authorization Time Period 05/28/2021 thru 08/21/2021    Authorization - Visit Number 8    Progress Note Due on Visit 10    SLP Start Time 1500    SLP Stop Time  1600    SLP Time Calculation (min) 60 min    Activity Tolerance Patient tolerated treatment well             Past Medical History:  Diagnosis Date   Anoxic brain injury ([place])    Cardiac arrest ([place])    Cognitive communication deficit    Hyperlipidemia    Hypertension    Seizures ([place])    Ventricular fibrillation ([place])     Past Surgical History:  Procedure Laterality Date   PEG PLACEMENT N/A 12/13/2020   Procedure: PERCUTANEOUS ENDOSCOPIC GASTROSTOMY (PEG) PLACEMENT;  Surgeon: Lesly Rubenstein, MD;  Location: [facility] ENDOSCOPY;  Service: Endoscopy;  Laterality: N/A;   TRACHEOSTOMY TUBE PLACEMENT N/A 12/13/2020   Procedure: TRACHEOSTOMY;  Surgeon: Clyde Canterbury, MD;  Location: [facility] ORS;  Service: ENT;  Laterality: N/A;    There were no vitals filed for this visit.   Subjective Assessment - 08/02/21 0954     Subjective "I had a test for disability, she said to work on my cognition"    Currently in Pain? No/denies                   ADULT SLP TREATMENT - 08/02/21 0001       Treatment Provided   Treatment provided Cognitive-Linquistic      Cognitive-Linquistic Treatment   Treatment focused on Cognition;Patient/family/caregiver education    Skilled Treatment COGNITION:minimal cues to decrease impulsivity when  completing directional semi-complex activity on iPad ([employer]); moderate faded minimal assistance to complete levels [id number] d/t impulsivity and missed directional movement; moderate faded to minimal assistance to complete scanning task from left to right              SLP Education - 08/02/21 1001     Education Details tasks targeting cognition    Person(s) Educated Patient    Methods Explanation;Demonstration    Comprehension Need further instruction              SLP Short Term Goals - 05/29/21 [place] #1   Title Pt will independently demonstrate selective attention in mildly distracting environment for 15 minutes.    Status Achieved      SLP SHORT TERM GOAL #2   Title With moderate cues, pt will utilize speech intelligibility strategies to achieve > 90% intelligibility at the sentence level.    Baseline Goal met, revised    Time 10    Period --   sessions   Status Revised      SLP SHORT TERM GOAL #3   Title With minimal cues, pt will utilize compensatory memory strategies to recall therapy sessions for week as well as order of therapy in 8  out of 10 sessions.    Baseline goal met, goal upgraded    Time 10    Period --   sessions   Status Revised      SLP SHORT TERM GOAL #4   Title With moderate assistance, pt will complete basic problem solving tasks with >95% accuracy.    Time 10    Period --   sessions   Status On-going              SLP Long Term Goals - 05/29/21 1538       SLP LONG TERM GOAL #2   Title Pt will independently complete functional problem solving for basic and familiar tasks.    Time 12    Period Weeks    Status On-going    Target Date 08/21/21      SLP LONG TERM GOAL #3   Title Pt will demonstrate selective attention in a moderately distracting environment for 15 minutes with < 1 redirection to task.    Time 12    Period Weeks    Status On-going    Target Date 08/21/21      SLP LONG TERM GOAL #4   Title  With minimal cues, pt will increase speech intelligibility to > 75% at the conversation level.    Baseline goal met, goal updated    Time 12    Period Weeks    Status Revised    Target Date 08/21/21              Plan - 08/02/21 1002     Clinical Impression Statement Pt continues to present with moderate to severe cognitive communication deficits related to anoxic brain injury. These deficits appear to result in decreased confidence in herself. Skilled ST intervention is required to target pt's cognitive communication deficits to increase her functional independence and her confidence in herself within her extended family, the community, work and leisure activities.    Speech Therapy Frequency 2x / week    Duration 12 weeks    Treatment/Interventions Cognitive reorganization;Compensatory techniques;Internal/external aids;SLP instruction and feedback;Patient/family education;Functional tasks;Compensatory strategies    Potential to Achieve Goals Good    Potential Considerations Severity of impairments;Ability to learn/carryover information    SLP Home Exercise Plan provided, see instruction section    Consulted and Agree with Plan of Care Patient             Patient will benefit from skilled therapeutic intervention in order to improve the following deficits and impairments:   Cognitive communication deficit  Anoxic brain injury [facility])    Problem List Patient Active Problem List   Diagnosis Date Noted   Multiple lung nodules 03/27/2021   Dysarthria and anarthria 02/11/2021   Sinus tachycardia    Labile blood glucose    Dysphagia    Seizure prophylaxis    Malnutrition of moderate degree 01/23/2021   Anoxic brain injury ([place]) 01/10/2021   Prediabetes    Dyslipidemia    S/P percutaneous endoscopic gastrostomy (PEG) tube placement [facility])    Status post tracheostomy ([place])    Aspiration pneumonia of both lower lobes due to gastric secretions ([place]) 01/05/2021   Acute  respiratory failure (H[facility])    Hypoxic ischemic encephalopathy    Endotracheal tube present    Cardiac arrest ([place]) 12/02/2020   Essential hypertension 12/09/2011   Scout Gumbs B. Rutherford Nail M.S., CCC-SLP, [place] Office [phone]  Stormy Fabian 08/02/2021, 10:03 AM  [place] MAIN [facility] SERVICES  [place], [place], [address] Phone: [phone]   Fax:  [phone]   Name: HEBE MERRIWETHER MRN: [id number] Date of Birth: 04/08/1966

## 2021-08-05 ENCOUNTER — Other Ambulatory Visit: Payer: Self-pay

## 2021-08-05 ENCOUNTER — Encounter: Payer: Self-pay | Admitting: Occupational Therapy

## 2021-08-05 ENCOUNTER — Ambulatory Visit: Payer: Self-pay | Admitting: Speech Pathology

## 2021-08-05 ENCOUNTER — Ambulatory Visit: Payer: Self-pay | Admitting: Occupational Therapy

## 2021-08-05 DIAGNOSIS — G931 Anoxic brain damage, not elsewhere classified: Secondary | ICD-10-CM

## 2021-08-05 DIAGNOSIS — M6281 Muscle weakness (generalized): Secondary | ICD-10-CM

## 2021-08-05 DIAGNOSIS — R41841 Cognitive communication deficit: Secondary | ICD-10-CM

## 2021-08-05 DIAGNOSIS — R278 Other lack of coordination: Secondary | ICD-10-CM

## 2021-08-05 NOTE — Therapy (Signed)
[place] MAIN [facility] SERVICES [address] [place], [place], [address] Phone: [phone]   Fax:  [phone]  Speech Language Pathology Treatment  Patient Details  Name: [name] [name] MRN: [id number] Date of Birth: 30-Nov-1965 Referring Provider (SLP): Marlowe Shores, [place]   Encounter Date: 08/05/2021   End of Session - 08/05/21 1642     Visit Number 39    Number of Visits 4    Date for SLP Re-Evaluation 08/21/21    Authorization Type Self Pay    Authorization Time Period 05/28/2021 thru 08/21/2021    Authorization - Visit Number 9    Progress Note Due on Visit 10    SLP Start Time 1500    SLP Stop Time  1600    SLP Time Calculation (min) 60 min    Activity Tolerance Patient tolerated treatment well             Past Medical History:  Diagnosis Date   Anoxic brain injury ([place])    Cardiac arrest ([place])    Cognitive communication deficit    Hyperlipidemia    Hypertension    Seizures ([place])    Ventricular fibrillation ([place])     Past Surgical History:  Procedure Laterality Date   PEG PLACEMENT N/A 12/13/2020   Procedure: PERCUTANEOUS ENDOSCOPIC GASTROSTOMY (PEG) PLACEMENT;  Surgeon: Lesly Rubenstein, MD;  Location: [facility] ENDOSCOPY;  Service: Endoscopy;  Laterality: N/A;   TRACHEOSTOMY TUBE PLACEMENT N/A 12/13/2020   Procedure: TRACHEOSTOMY;  Surgeon: Clyde Canterbury, MD;  Location: [facility] ORS;  Service: ENT;  Laterality: N/A;    There were no vitals filed for this visit.   Subjective Assessment - 08/05/21 1640     Subjective "I am doing really good"    Currently in Pain? No/denies                   ADULT SLP TREATMENT - 08/05/21 0001       Treatment Provided   Treatment provided Cognitive-Linquistic      Cognitive-Linquistic Treatment   Treatment focused on Cognition;Patient/family/caregiver education    Skilled Treatment COGNITION:minimal cuesto achieve >95% during basic scanning task with time constraints and  mild distractions; moderate to maximal cues to achive ~ 60% accuracy when complexity of task and distractions increased d/t slow processing and attention deficits              SLP Education - 08/05/21 1642     Education Details attention, scanning, processing    Person(s) Educated Patient    Methods Explanation;Demonstration    Comprehension Verbalized understanding;Returned demonstration;Need further instruction              SLP Short Term Goals - 05/29/21 1536       SLP SHORT TERM GOAL #1   Title Pt will independently demonstrate selective attention in mildly distracting environment for 15 minutes.    Status Achieved      SLP SHORT TERM GOAL #2   Title With moderate cues, pt will utilize speech intelligibility strategies to achieve > 90% intelligibility at the sentence level.    Baseline Goal met, revised    Time 10    Period --   sessions   Status Revised      SLP SHORT TERM GOAL #3   Title With minimal cues, pt will utilize compensatory memory strategies to recall therapy sessions for week as well as order of therapy in 8 out of 10 sessions.    Baseline goal met, goal  upgraded    Time 10    Period --   sessions   Status Revised      SLP SHORT TERM GOAL #4   Title With moderate assistance, pt will complete basic problem solving tasks with >95% accuracy.    Time 10    Period --   sessions   Status On-going              SLP Long Term Goals - 05/29/21 1538       SLP LONG TERM GOAL #2   Title Pt will independently complete functional problem solving for basic and familiar tasks.    Time 12    Period Weeks    Status On-going    Target Date 08/21/21      SLP LONG TERM GOAL #3   Title Pt will demonstrate selective attention in a moderately distracting environment for 15 minutes with < 1 redirection to task.    Time 12    Period Weeks    Status On-going    Target Date 08/21/21      SLP LONG TERM GOAL #4   Title With minimal cues, pt will increase  speech intelligibility to > 75% at the conversation level.    Baseline goal met, goal updated    Time 12    Period Weeks    Status Revised    Target Date 08/21/21              Plan - 08/05/21 1643     Clinical Impression Statement Pt continues to present with moderate to severe cognitive communication deficits related to anoxic brain injury. Pt has difficulty with all cognitive function as complexity of tasks increase beyond a basic level with moderate assistance for semi-complex tasks and maximal assistance for complex tasks.  Skilled ST intervention is required to target pt's cognitive communication deficits to increase her functional independence and her confidence in herself within her extended family, the community, work and leisure activities.    Speech Therapy Frequency 2x / week    Duration 12 weeks    Treatment/Interventions Cognitive reorganization;Compensatory techniques;Internal/external aids;SLP instruction and feedback;Patient/family education;Functional tasks;Compensatory strategies    Potential to Achieve Goals Good    Potential Considerations Severity of impairments;Ability to learn/carryover information    SLP Home Exercise Plan provided, see instruction section    Consulted and Agree with Plan of Care Patient             Patient will benefit from skilled therapeutic intervention in order to improve the following deficits and impairments:   Cognitive communication deficit  Anoxic brain injury [facility])    Problem List Patient Active Problem List   Diagnosis Date Noted   Multiple lung nodules 03/27/2021   Dysarthria and anarthria 02/11/2021   Sinus tachycardia    Labile blood glucose    Dysphagia    Seizure prophylaxis    Malnutrition of moderate degree 01/23/2021   Anoxic brain injury ([place]) 01/10/2021   Prediabetes    Dyslipidemia    S/P percutaneous endoscopic gastrostomy (PEG) tube placement [facility])    Status post tracheostomy ([place])    Aspiration  pneumonia of both lower lobes due to gastric secretions ([place]) 01/05/2021   Acute respiratory failure (H[facility])    Hypoxic ischemic encephalopathy    Endotracheal tube present    Cardiac arrest ([place]) 12/02/2020   Essential hypertension 12/09/2011   Bryan Goin B. Rutherford Nail M.S., CCC-SLP, [place] Office [phone]  Stormy Fabian 08/05/2021, 4:44 PM  [place]  [place] MAIN [facility] SERVICES [address] [place], [place], [address] Phone: [phone]   Fax:  [phone]   Name: [name] [name] MRN: [id number] Date of Birth: 08-15-1966

## 2021-08-05 NOTE — Therapy (Signed)
[place] MAIN [facility] [place] SERVICES [address] [place], [place], [address] Phone: [phone]   Fax:  [phone]  Occupational Therapy Treatment  Patient Details  Name: [name] [name] MRN: [id number] Date of Birth: 1966/06/25 No data recorded  Encounter Date: 08/05/2021   OT End of Session - 08/05/21 1606     Visit Number 33    Number of Visits 48    Date for OT Re-Evaluation 08/21/21    Authorization Type Progress reports period starting 02/11/2021    OT Start Time 1600    OT Stop Time 1645    OT Time Calculation (min) 45 min    Activity Tolerance Patient tolerated treatment well    Behavior During Therapy [facility] for tasks assessed/performed             Past Medical History:  Diagnosis Date   Anoxic brain injury ([place])    Cardiac arrest ([place])    Cognitive communication deficit    Hyperlipidemia    Hypertension    Seizures ([place])    Ventricular fibrillation ([place])     Past Surgical History:  Procedure Laterality Date   PEG PLACEMENT N/A 12/13/2020   Procedure: PERCUTANEOUS ENDOSCOPIC GASTROSTOMY (PEG) PLACEMENT;  Surgeon: Lesly Rubenstein, MD;  Location: [facility] ENDOSCOPY;  Service: Endoscopy;  Laterality: N/A;   TRACHEOSTOMY TUBE PLACEMENT N/A 12/13/2020   Procedure: TRACHEOSTOMY;  Surgeon: Clyde Canterbury, MD;  Location: [facility] ORS;  Service: ENT;  Laterality: N/A;    There were no vitals filed for this visit.   Subjective Assessment - 08/05/21 1606     Subjective  Pt. reports that she is doing well today.    Patient is accompanied by: Family member    Pertinent History Pt. is a 55 y.o. female who went into cardiac arrest, and was admitted to [facility] on 12/02/2020. Pt. was diagnosed with Anoxic Brain Injury following the Cardiac Arrest. When medically stable, the pt. was tranferred to [facility] re[facility]. The patient was discharged home on 02/08/2021, and is ready to begin outpatient OT services. Pt. was independent, and working  prior to the onset. Pt. Has very supportive family. Pt. Enjoys gardening.    Currently in Pain? No/denies            OT TREATMENT   Therapeutic Exercise:  Pt. worked on the [employer] for 8 min. With constant monitoring of the BUEs. Pt. Worked on changing, and alternating forward reverse position every 2 min. Rest breaks were required.     Therapeutic Activities:   Pt. worked on Location manager and aligning terra pieces according to [employer] placed on the tabletop vertically in front of her. Pt. worked on [facility] skills grasping, and turning the Environmental health practitioner.    Pt. was able to independently, and efficiently complete the first full design pattern without any cues. No cues were required for the accuracy of the direction of each tetra piece for the first design. Pt. required increased time, and extensive cues for the direction of the orange 5pt. tetra piece when completing the second design pattern, as well as multiple tetra pieces for the last 1/4 of the design pattern. Pt. required step-by-step verbal, and visual cues to turn the design pieces in the appropriate direction to fit, and follow the exact design pattern. Pt. continues to work on improving visual perceptual skills, UE strength, [place], and bilateral hand function in order to maximize independence with ADLs, and IADL tasks.  OT Education - 08/05/21 1606     Education Details goals, measurments    Person(s) Educated Patient    Methods Explanation;Demonstration    Comprehension Verbalized understanding;Returned demonstration                 OT Long Term Goals - 07/24/21 1617       OT LONG TERM GOAL #1   Title Pt. will be able to independently sign her name with 100% legibility.    Baseline Pt. continues to present with limited legibility formulating her signature with inconsistent spacing between her first, and last  name. Eval: Signature not legible, however was able to sign on the line with no deviation above, or below the line.  Recert: Signature with 75% legibility. Spacing words on a line is impaired and requires extensive cueing to scan L to R across the paper. 20th visit: 75% legible printed, 50% signature.    Time 12    Period Weeks    Status On-going    Target Date 08/21/21      OT LONG TERM GOAL #2   Title .      OT LONG TERM GOAL #3   Title Pt. will increase right grip strength by 5# to be able hold a full glass    Baseline 07/24/2021:  Pt. is making progress with opening containers., however has difficulty opening soda bottles. Pt. is improving with formulating a composite fist. Eval: limited grip strength. Limited bilateral digit flexion, unable to make a full composite fist. 20th visit: Pt. is making excellent progress with bilateral grip strengt. Pt. is able to hold, a d drink from a full glass.    Time 12    Period Weeks    Status Revised    Target Date 08/21/21      OT LONG TERM GOAL #4   Title Pt. will independently access a computer, and type a one sentence email correspondence.    Baseline 30th visit: Pt. is now starting to send work related emails, that her daughter reviews before sending..20th visit: Pt. continues to have difficulty tying a sentence leggibly. Pt. is able to type a one sentence correspondence with multiple mistypes. Eval: Pt. is unable to to perform    Time 12    Period Weeks    Status On-going    Target Date 08/21/21      OT LONG TERM GOAL #5   Title Pt. will improve bilateral [facility] skills to be able to grasp, and manipulate small objects during ADLs.    Baseline 30th visit: Pt. continues to present with  [facility] limitiations dropping multiple small objects from her hand during translatory movements of the hand. 20th visit: Pt. has made excellent progress with bialteral [facility] skills, and grasping small objects. Pt. continues to have difficulty grasping, and manipulating  small objects. Eval: Pt. presents with limited [facility] skills with difficulty picking,and manipulating small objects.    Time 12    Period Weeks    Status On-going    Target Date 08/21/21      OT LONG TERM GOAL #6   Title Pt. will improve UE strength by 2 mm grades to assist with ADLs, and IADLS,    Baseline [id number] visit: BUE strength: 4+/5 20th visit: BUE strength; 4+/5 overall. Pt. continues to present with limited BUE stregth. Pt. is engaging in more ADL,a nd IADL tasks at home. Eval: pt. has difficulty performing.    Time 12    Period Weeks    Status  On-going      OT LONG TERM GOAL #7   Title Pt. will complete light meal preparation with minA    Baseline 30th visit: Pt. is able to prepare  items for cooking, and make cold light meals, sandwiches. Pt. does not use the stovetop or oven.  06/16/2021: Pt. is assisting her daughter with cooking chiscken, bacon, slamon, and grits. Pt. is preparing meals with her daughter's assist. Eval: Pt. is unable to prepare light meal preparation.    Time 12    Period Weeks    Status On-going    Target Date 08/21/21      OT LONG TERM GOAL #8   Title Pt. will perform light home management tasks with minA    Baseline 30th visit: pt. continues to participate in more  home management tasks. 06/16/2021: Pt. is independent with bedmaking, vacuuming, dishes, dusting, sweeping the porch. Pt. is independent with her, and her husband's laundry. Pt.'s daughter helps her down the outdoor steps be[place] of the stones. Pt. is engaging in more home management tasks. Eval: pt. is unable to perform    Time 12    Period Weeks    Status On-going    Target Date 08/21/21      OT LONG TERM GOAL  #9   TITLE Pt will demonstrate improved visual/spatial skills as demonstrated by ability to adequately space words in a sentence with min vc.    Baseline 30th visit: Pt. continues to require cues, and assist for spacing between words when formulating sentences.  20th visit: Pt. requires  cues, and assist for spacing when writing sentences.    Time 12    Period Weeks    Status On-going    Target Date 08/21/21                   Plan - 08/05/21 1607     Clinical Impression Statement Pt. was able to independently, and efficiently complete the first full design pattern without any cues. No cues were required for the accuracy of the direction of each tetra piece for the first design. Pt. required increased time, and extensive cues for the direction of the orange 5pt. tetra piece when completing the second design pattern, as well as multiple tetra pieces for the last 1/4 of the design pattern. Pt. required step-by-step verbal, and visual cues to turn the design pieces in the appropriate direction to fit, and follow the exact design pattern. Pt. continues to work on improving visual perceptual skills, UE strength, [place], and bilateral hand function in order to maximize independence with ADLs, and IADL tasks.        OT Occupational Profile and History Problem Focused Assessment - Including review of records relating to presenting problem    Occupational performance deficits (Please refer to evaluation for details): ADL's;IADL's    Body Structure / Function / Physical Skills ADL;IADL;FMC;ROM;Strength    Rehab Potential Good    Clinical Decision Making Several treatment options, min-mod task modification necessary    Comorbidities Affecting Occupational Performance: May have comorbidities impacting occupational performance    Modification or Assistance to Complete Evaluation  Min-Moderate modification of tasks or assist with assess necessary to complete eval    OT Frequency 2x / week    OT Duration 12 weeks    OT Treatment/Interventions Self-care/ADL training;Neuromuscular education;Therapeutic activities;Patient/family education;DME and/or AE instruction;Visual/perceptual remediation/compensation    Consulted and Agree with Plan of Care Patient  Patient will  benefit from skilled therapeutic intervention in order to improve the following deficits and impairments:   Body Structure / Function / Physical Skills: ADL, IADL, FMC, ROM, Strength       Visit Diagnosis: Muscle weakness (generalized)  Other lack of coordination    Problem List Patient Active Problem List   Diagnosis Date Noted   Multiple lung nodules 03/27/2021   Dysarthria and anarthria 02/11/2021   Sinus tachycardia    Labile blood glucose    Dysphagia    Seizure prophylaxis    Malnutrition of moderate degree 01/23/2021   Anoxic brain injury ([place]) 01/10/2021   Prediabetes    Dyslipidemia    S/P percutaneous endoscopic gastrostomy (PEG) tube placement [facility])    Status post tracheostomy ([place])    Aspiration pneumonia of both lower lobes due to gastric secretions ([place]) 01/05/2021   Acute respiratory failure ([facility])    Hypoxic ischemic encephalopathy    Endotracheal tube present    Cardiac arrest ([place]) 12/02/2020   Essential hypertension 12/09/2011    Harrel Carina, MS, OTR/L 08/05/2021, 4:09 PM  [place] MAIN [facility] SERVICES [address] [place], [place], [address] Phone: [phone]   Fax:  [phone]  Name: [name] [name] MRN: [id number] Date of Birth: Mar 27, 1966

## 2021-08-05 NOTE — Patient Instructions (Signed)
Continue progress by 5 levels within apps on her ipad (alike/difference and 3d parking lot)

## 2021-08-06 ENCOUNTER — Inpatient Hospital Stay: Payer: Self-pay | Attending: Obstetrics and Gynecology | Admitting: Obstetrics and Gynecology

## 2021-08-06 ENCOUNTER — Encounter: Payer: Self-pay | Admitting: Obstetrics and Gynecology

## 2021-08-06 VITALS — BP 150/92 | HR 82 | Temp 98.7°F | Resp 20 | Wt 195.6 lb

## 2021-08-06 DIAGNOSIS — R918 Other nonspecific abnormal finding of lung field: Secondary | ICD-10-CM | POA: Insufficient documentation

## 2021-08-06 DIAGNOSIS — D252 Subserosal leiomyoma of uterus: Secondary | ICD-10-CM

## 2021-08-06 DIAGNOSIS — D250 Submucous leiomyoma of uterus: Secondary | ICD-10-CM

## 2021-08-06 DIAGNOSIS — D259 Leiomyoma of uterus, unspecified: Secondary | ICD-10-CM

## 2021-08-06 NOTE — Progress Notes (Signed)
Gynecologic Oncology Interval Visit   Referring Provider: Dr. Rogue Bussing  Chief Concern: abnormal uterine findings on PET scan  Subjective:  [name] [name] is a 55 y.o. P2 (1 SVD; 1 C/S) female with hsitory of leiomyoma and cardiac arrest with anoxic brain injury, returns to clinic for follow up for PET avid uterine masses c/w leiomyoma.   In the interim, she was discussed at Tumor Board to evaluate lung findings:   Unchanged appearance of bilobed right lower lobe solid pulmonary nodules (1.8 cm), left upper lobe solid pulmonary nodule (1.4 cm), and diffuse scattered less than 6 mm solid pulmonary nodules.   She is now followed by lung nodule clinic.   At her last visit with [place] on 04/02/2021 she had a normal FSH and negative Pap NIML/HRHPV negative.   04/08/2021 pelvic MRI  Reproductive: Uterus: Measures 13.0 x 9.8 by 13.2 cm (volume = 880 cm^3). There are multiple uterine fibroids, too numerous to count. The FDG avid fibroids within the posterior lower uterine segment and anterior lower uterine segment fibroids are intramural, submucosal, and subserosal  IMPRESSION: 1. Enlarged uterus with uterine fibroids which are too numerous to count. There are no distinguishing characteristics between the FDG avid and non FDG avid fibroids identified on recent PET-CT. The FDG avid fibroid within the left anterior lower uterine segment exhibits relative hypoenhancement on the postcontrast images. However, the FDG avid fibroid within the posterior lower uterine segment exhibits relative uniform enhancement on the postcontrast images. If there is a clinical concern for leiomyosarcoma or metastasizing benign leiomyomas correlation with tissue sampling is recommended. 2. No acute findings within the pelvis. 3. Diastasis recti with ventral herniation of the anterior fundal fibroid.    She had a normal cycle 3 months ago; no bleeding the following month; and this past month spotting during her  anticipated menses.   Per patient and her husband she is doing very well. Continues to recover from cardiac arrest. Continues to work with physical and occupational therapy. No abdominal pain, changes in bowel movements.    Gynecologic History:  She has a long history of leiomyoma dating all the way back to 2009. She has not been symptomatic. Her cycles are regular x 5-7 days with normal flow, occurring monthly with menarche at age 27. She missed her cycle for ~3 months which she was in the ICU.  Her last Pap is unknown. She has not had a pelvic exam in ~4-5 years per her report.   February 15, 2012 Last Pelvic US Findings:  The uterus is anteverted. It measures 8.9 x 12.0 x 7.3 cm  transabdominally.  #1: In the anterior left lateral fundus there is an isoechoic subserosal  fibroid labeled 1, which measures 3.0 x 2.4 x 2.9 cm (previously 2.2 x  1.9 x 2.3 cm).  #2: Posterior to structure 1 in the mid-uterine fundus is a hypoechoic  subserosal fibroid labeled 2, which measures 2.5 x 1.7 x 2.7 cm (previously 2.5 x 2.2 x 2.7 cm).  #3: In the posterior left uterus adjacent to endometrium with likely  submucosal component there is an isoechoic fibroid, which measures 2.6 x  2.2 x 2.2 cm (does not correspond to #3 on prior).  #4: No fibroid was labeled 4.  #5: In the posterior uterine body there is an isoechoic heterogeneous  fibroid labeled 5, which measures 3.9 x 3.1 x 4.5 cm (does not correspond  to #5 on prior).  #6: In the right uterine fundus there is a isoechoic exophytic  subserosal  fibroid labeled 6, which measures 2.7 x 2.4 x 2.7 cm.  #7: Inferior to structure 6 in the right uterine fundus is an isoechoic  fibroid, labeled 7, which measures 2.2 x 1.5 x 2.6 cm.  #8: In the left posterior body there is an isoechoic intramural fibroid,  labeled 8, which measures 3.4 x 2.7 x 3.4 cm.   There is a nabothian cyst present in the cervix.   11/2020 Ms. Anding had cardiac arrest [noted to be in V. Fib] s/p  ACLS-defibrillation/intubation. Anoxic brain injury-PEG/trach; Her course was complicated by aspiration pneumonia from hospitalization needing tracheostomy/PEG tube placement.  Patient after lengthy hospital stay was discharged to rehab where she subsequently recovered.  As part of her evaluation she had a coronary CT-incidentally picked up that right lower lobe nodule.    February 27, 2021- A lobulated solid nodule is seen in the posterior right lower lobe measures 2.2 x 1.9 cm.   March 20, 2021 This was followed by a PET scan-no significant uptake in the right lower lobe lung nodule; however noted to have uterine mass/uptake -  fibroid versus malignancy   FINDINGS: Mediastinal blood pool activity: SUV max 1.2   Liver activity: SUV max NA   NECK: No areas of abnormal hypermetabolism.   Incidental CT findings: No cervical adenopathy. Minimal mucosal thickening of bilateral maxillary sinuses.   CHEST: Low-level right hilar and infrahilar nodal hypermetabolism without adenopathy is favored to be reactive. The right infrahilar hypermetabolism measures a S.U.V. max of 2.3 including on image 91/3.   The right lower lobe lung nodule measures 1.9 by 1.9 cm and is not significantly  hpermetabolic, including at a S.U.V. max of 1.5. This measured 2.2 x 1.9 cm on the prior chest CT.   A dominant posterior left upper lobe pulmonary nodule measures 1.1 cm and a S.U.V. max of 1.1 on 67/3.   Incidental CT findings: Smaller bilateral pulmonary nodules as on prior chest CT.   ABDOMEN/PELVIS: An aortocaval node measures 5 mm a S.U.V. max of 3.3 on 156/3.   Left adrenal hypermetabolism laterally corresponds to low-density nodularity, favoring an adenoma. Example 1.3 cm and s.U.V. Max of 3.4.   Uterine enlargement and heterogeneity with 2 hypermetabolic lesions within. Example at ar S.U.V. max of 15.4 within the right-side of the uterine body/fundus.   Incidental CT findings: No renal calculi or hydronephrosis.  Aortic atherosclerosis. Mild pelvic floor laxity.   SKELETON: Likely degenerative hypermetabolism about both shoulders. There is also hypermetabolism about the caudal portion of the medial right clavicle, in the region of the anterior first right rib. No CT correlate. Example at a S.U.V. max of 2.9.   Incidental CT findings: none   IMPRESSION: 1. Bilateral pulmonary nodules, including the dominant right lower lobe pulmonary nodule, which are not significantly FDG avid. Considerations include non FDG avid metastasis or infectious/inflammatory process. Potential clinical strategies include tissue sampling versus 3 month follow-up with CT. 2. Uterine enlargement heterogeneity, with hypermetabolic lesions within. Most likely fibroids. Given the pulmonary nodules, leiomyosarcoma(s) or metastasizing benign leiomyomas are considerations. 3. Small abdominal retroperitoneal hypermetabolic node. Most likely benign/reactive. If the patient is diagnosed with metastatic disease, nodal metastasis would be considered. 4. Right chest wall hypermetabolism adjacent the caudal portion of the medial clavicle. Most likely degenerative. The clinical history on cardiac CTA describes cardiac arrest. CT occult injury from CPR could look similar.  She presents today for evaluation. She accompanied by her husband. 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 They had two children - a boy and a girl.     Problem List: Patient Active Problem List   Diagnosis Date Noted   Multiple lung nodules 03/27/2021   Dysarthria and anarthria 02/11/2021   Sinus tachycardia    Labile blood glucose    Dysphagia    Seizure prophylaxis    Malnutrition of moderate degree 01/23/2021   Anoxic brain injury ([place]) 01/10/2021   Prediabetes    Dyslipidemia    S/P percutaneous endoscopic gastrostomy (PEG) tube placement [facility])    Status post tracheostomy ([place])    Aspiration pneumonia of both lower lobes due to  gastric secretions ([place]) 01/05/2021   Acute respiratory failure ([facility])    Hypoxic ischemic encephalopathy    Endotracheal tube present    Cardiac arrest ([place]) 12/02/2020   Essential hypertension 12/09/2011    Past Medical History: Past Medical History:  Diagnosis Date   Anoxic brain injury ([place])    Cardiac arrest ([place])    Cognitive communication deficit    Hyperlipidemia    Hypertension    Seizures ([place])    Ventricular fibrillation ([place])     Past Surgical History: Past Surgical History:  Procedure Laterality Date   PEG PLACEMENT N/A 12/13/2020   Procedure: PERCUTANEOUS ENDOSCOPIC GASTROSTOMY (PEG) PLACEMENT;  Surgeon: Lesly Rubenstein, MD;  Location: [facility] ENDOSCOPY;  Service: Endoscopy;  Laterality: N/A;   TRACHEOSTOMY TUBE PLACEMENT N/A 12/13/2020   Procedure: TRACHEOSTOMY;  Surgeon: Clyde Canterbury, MD;  Location: [facility] ORS;  Service: ENT;  Laterality: N/A;    Past Gynecologic History:  Menarche: 12 Menstrual details: as per HPI Menses regular: Yes History of Abnormal pap:No Last pap: obtained 04/02/2021 Contraception: None - uses withdrawal method Sexually active: no not at this time since her illness  OB History: G10P2 (1SVD - preterm; 1 C/S) OB History  Gravida Para Term Preterm AB Living  '10 2     8    '$ SAB IAB Ectopic Multiple Live Births  8            # Outcome Date GA Lbr Len/2nd Weight Sex Delivery Anes PTL Lv  10 SAB           9 SAB           8 SAB           7 SAB           6 SAB           5 SAB           4 SAB           3 SAB           2 Para           1 Para             Family History: Family History  Problem Relation Age of Onset   Hypertension Mother    Leukemia Mother    Diabetes Mother    Healthy Father     Social History: Social History   Socioeconomic History   Marital status: Married    Spouse name: Not on file   Number of children: Not on file   Years of education: Not on file   Highest education level: Not on file   Occupational History   Not on file  Tobacco Use   Smoking status: Former    Types: Cigarettes    Quit date: 2007  Years since quitting: 15.7   Smokeless tobacco: Never  Vaping Use   Vaping Use: Never used  Substance and Sexual Activity   Alcohol use: No   Drug use: Never   Sexual activity: Not on file  Other Topics Concern   Not on file  Social History Narrative   QUIT smoke [2001]; no alcohol; lives in [place] with husband; office work- own [employer].    Social Determinants of Health   Financial Resource Strain: Not on file  Food Insecurity: Not on file  Transportation Needs: Not on file  Physical Activity: Not on file  Stress: Not on file  Social Connections: Not on file  Intimate Partner Violence: Not on file    Allergies: No Known Allergies  Current Medications: Current Outpatient Medications  Medication Sig Dispense Refill   amLODipine (NORVASC) 10 MG tablet Take 1 tablet (10 mg total) by mouth daily. 30 tablet 5   atorvastatin (LIPITOR) 80 MG tablet Take 1 tablet (80 mg total) by mouth daily. 30 tablet 5   metoprolol tartrate (LOPRESSOR) 25 MG tablet Take 1 tablet (25 mg total) by mouth 2 (two) times daily. 60 tablet 5   potassium chloride SA (KLOR-CON M20) 20 MEQ tablet Take 2 tablets (40 mEq total) by mouth daily. 60 tablet 5   saccharomyces boulardii (FLORASTOR) 250 MG capsule Take 1 capsule (250 mg total) by mouth 2 (two) times daily. 60 capsule 0   No current facility-administered medications for this visit.    Review of Systems General:  no complaints Skin: no complaints Eyes: no complaints HEENT: no complaints Breasts: no complaints Pulmonary: no complaints Cardiac: no complaints Gastrointestinal: no complaints Genitourinary/Sexual: no complaints Ob/Gyn: no complaints Musculoskeletal: no complaints Hematology: no complaints Neurologic/Psych: no complaints    Objective:  Physical Examination:  BP (!) 150/92   Pulse 82   Temp  98.7 F (37.1 C)   Resp 20   Wt 195 lb 9.6 oz (88.7 kg)   SpO2 100%   BMI 33.57 kg/m    ECOG Performance Status: 1 - Symptomatic but completely ambulatory  GENERAL: Patient is a well appearing female in no acute distress. Accompanied by husband. HEENT:  Sclera clear. Anicteric NODES:  Negative axillary, supraclavicular, inguinal lymph node survery LUNGS:  Clear to auscultation bilaterally.   HEART:  Regular rate and rhythm.  ABDOMEN:  Soft, nontender, nondistended. Pelvic mass is 4 finger breadths below umbilicus; minimal mobility.  EXTREMITIES:  No peripheral edema. Atraumatic. No cyanosis SKIN:  Clear with no obvious rashes or skin changes.  NEURO:  Nonfocal. Well oriented.  Appropriate affect.  Pelvic: Exam chaperoned by CMA EGBUS: no lesions Cervix: no lesions, nontender Vagina: no lesions, no discharge or bleeding Uterus: enlarged 16 week size; nontender, limited mobility but parameter free bilaterally. Adnexa: no palpable masses but limited by uterine size Rectovaginal: deferred     Radiologic Imaging:  04/08/2021 MRI imaging:  EXAM:  MRI PELVIS WITHOUT AND WITH CONTRAST   TECHNIQUE: Multiplanar multisequence MR imaging of the pelvis was performed both before and after administration of intravenous contrast.   CONTRAST:  84m GADAVIST GADOBUTROL 1 MMOL/ML IV SOLN   COMPARISON:  PET-CT from 03/19/2021   FINDINGS: Urinary Tract:  Urinary bladder is within normal limits.   Bowel:  Unremarkable visualized pelvic bowel loops.   Vascular/Lymphatic: Patent vasculature. No aneurysm identified. No pelvic adenopathy. Right pelvic sidewall lymph node is upper limits of normal measuring 9 mm previously 8 mm, image 31/21.   Reproductive: Uterus: Measures 13.0  x 9.8 by 13.2 cm (volume = 880 cm^3). There are multiple uterine fibroids, too numerous to count. The FDG avid fibroids within the posterior lower uterine segment and anterior lower uterine segment fibroids are  intramural, submucosal, and subserosal including:   -FDG avid fibroid within the left anterior lower uterine segment measures 2.3 x 2.5 by 2.3 cm, image 32/17 and image 46/27. This exhibits T2 hypointensity, T1 isointensity with heterogeneous hypoenhancement on the postcontrast images.   -Smaller FDG avid fibroid within the posterior lower uterine segment is T2 hypointense, T1 isointense and exhibits avid enhancement on the postcontrast images.   -not significantly FDG avid is the dominant fibroid within the left side of fundus with a long axis of 6.4 cm, image 18/14. This exhibits uniform enhancement on the postcontrast images.   -also non FDG avid is a exophytic subserosal fibroid arising off the anterior fundus measures 4 cm, image 36/27. this also exhibits relative uniform enhancement on the postcontrast images.   Endometrium: Measures 5 mm, image 18/28. No focal abnormality aside from submucosal fibroids.   Right ovary: Measures 4.4 x 3.5 by 3.9 cm (volume = 31 cm^3). Multiple prominent cysts are identified. The largest measures 2.8 cm, image 26/2.   Left ovary: Measures 4.9 x 1.6 by 2.4 cm (volume = 9.9 cm^3). Prominent follicles are noted. The largest measures 8 mm, image 13/3.   Other: Trace free fluid noted within the cul-de-sac. No discrete fluid collections.   Musculoskeletal: Diastasis recti is identified with ventral herniation of anterior fundal fibroid, image 9/3.   IMPRESSION: 1. Enlarged uterus with uterine fibroids which are too numerous to count. There are no distinguishing characteristics between the FDG avid and non FDG avid fibroids identified on recent PET-CT. The FDG avid fibroid within the left anterior lower uterine segment exhibits relative hypoenhancement on the postcontrast images. However, the FDG avid fibroid within the posterior lower uterine segment exhibits relative uniform enhancement on the postcontrast images. If there is a clinical  concern for leiomyosarcoma or metastasizing benign leiomyomas correlation with tissue sampling is recommended. 2. No acute findings within the pelvis. 3. Diastasis recti with ventral herniation of the anterior fundal fibroid.     Electronically Signed   By: Kerby Moors M.D.   On: 04/10/2021 09:26      Assessment:  ADIANNA WHIPP is a 55 y.o. female diagnosed with PET avid uterine masses c/w leiomyoma, overall stable size on imaging, but slightly increased by exam today. She remains asymptomatic. Fibroids can be PET avid Ma et al [employer]. 2016).   History of mild anemia, MCV normal, asymptomatic   Medical co-morbidities complicating care: H/o cardiac arrest and anoxic brain injury.  Plan:   Problem List Items Addressed This Visit   None Visit Diagnoses     Uterine leiomyoma, unspecified location    -  Primary   Relevant Orders   MR PELVIS W WO CONTRAST      Given the slight increase in size I recommended repeat MRI and return for repeat exam in 3 months for close surveillance. She is asymptomatic which is reassuring.    Precautions given regarding symptoms specifically increased or abnormal bleeding, pelvic and/or abdominal pain.    The patient's diagnosis, an outline of the further diagnostic and laboratory studies which will be required, the recommendation, and alternatives were discussed.  All questions were answered to the patient's satisfaction.  A total of 75 minutes were spent with the patient/family today; >50% was spent in education,  counseling and coordination of care for PET avid uterine masses c/w leiomyoma.  Pasqualina Colasurdo Gaetana Michaelis, MD

## 2021-08-07 ENCOUNTER — Other Ambulatory Visit: Payer: Self-pay

## 2021-08-07 ENCOUNTER — Ambulatory Visit: Payer: Self-pay | Admitting: Occupational Therapy

## 2021-08-07 ENCOUNTER — Ambulatory Visit: Payer: Self-pay | Admitting: Speech Pathology

## 2021-08-07 DIAGNOSIS — G931 Anoxic brain damage, not elsewhere classified: Secondary | ICD-10-CM

## 2021-08-07 DIAGNOSIS — R278 Other lack of coordination: Secondary | ICD-10-CM

## 2021-08-07 DIAGNOSIS — R41841 Cognitive communication deficit: Secondary | ICD-10-CM

## 2021-08-07 DIAGNOSIS — M6281 Muscle weakness (generalized): Secondary | ICD-10-CM

## 2021-08-08 ENCOUNTER — Encounter: Payer: Self-pay | Admitting: Occupational Therapy

## 2021-08-08 NOTE — Therapy (Signed)
[place] MAIN [facility] SERVICES [address] [place], [place], [address] Phone: [phone]   Fax:  [phone]  Occupational Therapy Treatment  Patient Details  Name: [name] [name] MRN: [id number] Date of Birth: May 22, 1966 No data recorded  Encounter Date: 08/07/2021   OT End of Session - 08/07/21 1639     Visit Number 34    Number of Visits 48    Date for OT Re-Evaluation 08/21/21    Authorization Type Progress reports period starting 02/11/2021    OT Start Time 1610    OT Stop Time 1645    OT Time Calculation (min) 35 min    Activity Tolerance Patient tolerated treatment well    Behavior During Therapy [facility] for tasks assessed/performed             Past Medical History:  Diagnosis Date   Anoxic brain injury ([place])    Cardiac arrest ([place])    Cognitive communication deficit    Hyperlipidemia    Hypertension    Seizures ([place])    Ventricular fibrillation ([place])     Past Surgical History:  Procedure Laterality Date   PEG PLACEMENT N/A 12/13/2020   Procedure: PERCUTANEOUS ENDOSCOPIC GASTROSTOMY (PEG) PLACEMENT;  Surgeon: Lesly Rubenstein, MD;  Location: [facility] ENDOSCOPY;  Service: Endoscopy;  Laterality: N/A;   TRACHEOSTOMY TUBE PLACEMENT N/A 12/13/2020   Procedure: TRACHEOSTOMY;  Surgeon: Clyde Canterbury, MD;  Location: [facility] ORS;  Service: ENT;  Laterality: N/A;    There were no vitals filed for this visit.   Subjective Assessment - 08/07/21 1638     Subjective  Pt. reports that she is going to go back to the office tomorrow.    Patient is accompanied by: Family member    Pertinent History Pt. is a 55 y.o. female who went into cardiac arrest, and was admitted to [facility] on 12/02/2020. Pt. was diagnosed with Anoxic Brain Injury following the Cardiac Arrest. When medically stable, the pt. was tranferred to [facility] rehab. The patient was discharged home on 02/08/2021, and is ready to begin outpatient OT services. Pt. was  independent, and working prior to the onset. Pt. Has very supportive family. Pt. Enjoys gardening.    Currently in Pain? No/denies            OT TREATMENT    Therapeutic Exercise:   Pt. worked on the [employer] for 8 min. With constant monitoring of the BUEs. Pt. Worked on changing, and alternating forward reverse position every 2 min. Rest breaks were required.      Therapeutic Activities:   Pt. worked on Fish farm manager, and [facility] sorting shapes, and Restaurant manager, fast food patterns.    Pt. reports that she is planning to return to the work office next week. Reviewed pt.'s work routines, work functions, as well as anticipated areas of difficulty. Pt. Reports that she is able to sort work applications, and receipts together by the date. Pt. Reports having difficulty with computer work related functions. Pt. Reports that she would like to work on filling out paper work related applications, and that it would make her really happy to be able to work on them. Pt. plans to bring in blank applications to practice completing them in written form during the next treatment session. Pt. was able to complete simple Parguetry design patterns more efficiently, and continues to require assist with more complex abstract design patterns requiring multiple shapes to be connected. Pt. continues to work on improving UE functioning,  and maximizing independence with ADLs, and IADLs.                                 OT Education - 08/07/21 1638     Education Details visual perceptual skills.    Person(s) Educated Patient    Methods Explanation    Comprehension Verbalized understanding;Returned demonstration                 OT Long Term Goals - 07/24/21 1617       OT LONG TERM GOAL #1   Title Pt. will be able to independently sign her name with 100% legibility.    Baseline Pt. continues to present with limited legibility formulating her signature with  inconsistent spacing between her first, and last name. Eval: Signature not legible, however was able to sign on the line with no deviation above, or below the line.  Recert: Signature with 75% legibility. Spacing words on a line is impaired and requires extensive cueing to scan L to R across the paper. 20th visit: 75% legible printed, 50% signature.    Time 12    Period Weeks    Status On-going    Target Date 08/21/21      OT LONG TERM GOAL #2   Title .      OT LONG TERM GOAL #3   Title Pt. will increase right grip strength by 5# to be able hold a full glass    Baseline 07/24/2021:  Pt. is making progress with opening containers., however has difficulty opening soda bottles. Pt. is improving with formulating a composite fist. Eval: limited grip strength. Limited bilateral digit flexion, unable to make a full composite fist. 20th visit: Pt. is making excellent progress with bilateral grip strengt. Pt. is able to hold, a d drink from a full glass.    Time 12    Period Weeks    Status Revised    Target Date 08/21/21      OT LONG TERM GOAL #4   Title Pt. will independently access a computer, and type a one sentence email correspondence.    Baseline 30th visit: Pt. is now starting to send work related emails, that her daughter reviews before sending..20th visit: Pt. continues to have difficulty tying a sentence leggibly. Pt. is able to type a one sentence correspondence with multiple mistypes. Eval: Pt. is unable to to perform    Time 12    Period Weeks    Status On-going    Target Date 08/21/21      OT LONG TERM GOAL #5   Title Pt. will improve bilateral [facility] skills to be able to grasp, and manipulate small objects during ADLs.    Baseline 30th visit: Pt. continues to present with  [facility] limitiations dropping multiple small objects from her hand during translatory movements of the hand. 20th visit: Pt. has made excellent progress with bialteral [facility] skills, and grasping small objects. Pt. continues  to have difficulty grasping, and manipulating small objects. Eval: Pt. presents with limited [facility] skills with difficulty picking,and manipulating small objects.    Time 12    Period Weeks    Status On-going    Target Date 08/21/21      OT LONG TERM GOAL #6   Title Pt. will improve UE strength by 2 mm grades to assist with ADLs, and IADLS,    Baseline [id number] visit: BUE strength: 4+/5 20th visit: BUE strength;  4+/5 overall. Pt. continues to present with limited BUE stregth. Pt. is engaging in more ADL,a nd IADL tasks at home. Eval: pt. has difficulty performing.    Time 12    Period Weeks    Status On-going      OT LONG TERM GOAL #7   Title Pt. will complete light meal preparation with minA    Baseline 30th visit: Pt. is able to prepare  items for cooking, and make cold light meals, sandwiches. Pt. does not use the stovetop or oven.  06/16/2021: Pt. is assisting her daughter with cooking chiscken, bacon, slamon, and grits. Pt. is preparing meals with her daughter's assist. Eval: Pt. is unable to prepare light meal preparation.    Time 12    Period Weeks    Status On-going    Target Date 08/21/21      OT LONG TERM GOAL #8   Title Pt. will perform light home management tasks with minA    Baseline 30th visit: pt. continues to participate in more  home management tasks. 06/16/2021: Pt. is independent with bedmaking, vacuuming, dishes, dusting, sweeping the porch. Pt. is independent with her, and her husband's laundry. Pt.'s daughter helps her down the outdoor steps [place] of the stones. Pt. is engaging in more home management tasks. Eval: pt. is unable to perform    Time 12    Period Weeks    Status On-going    Target Date 08/21/21      OT LONG TERM GOAL  #9   TITLE Pt will demonstrate improved visual/spatial skills as demonstrated by ability to adequately space words in a sentence with min vc.    Baseline 30th visit: Pt. continues to require cues, and assist for spacing between words when  formulating sentences.  20th visit: Pt. requires cues, and assist for spacing when writing sentences.    Time 12    Period Weeks    Status On-going    Target Date 08/21/21                   Plan - 08/07/21 1640     Clinical Impression Statement Pt. reports that she is planning to return to the work office next week. Reviewed pt.'s work routines, work functions, as well as anticipated areas of difficulty. Pt. Reports that she is able to sort work applications, and receipts together by the date. Pt. Reports having difficulty with computer work related functions. Pt. Reports that she would like to work on filling out paper work related applications, and that it would make her really happy to be able to work on them. Pt. plans to bring in blank applications to practice completing them in written form during the next treatment session. Pt. was able to complete simple Parguetry design patterns more efficiently, and continues to require assist with more complex abstract design patterns requiring multiple shapes to be connected. Pt. Continues to work on improving UE functioning, and maximizing independence with ADLs, and IADLs.          OT Occupational Profile and History Problem Focused Assessment - Including review of records relating to presenting problem    Occupational performance deficits (Please refer to evaluation for details): ADL's;IADL's    Body Structure / Function / Physical Skills ADL;IADL;FMC;ROM;Strength    Rehab Potential Good    Clinical Decision Making Several treatment options, min-mod task modification necessary    Comorbidities Affecting Occupational Performance: May have comorbidities impacting occupational performance    Modification or Assistance to  Complete Evaluation  Min-Moderate modification of tasks or assist with assess necessary to complete eval    OT Frequency 2x / week    OT Duration 12 weeks    OT Treatment/Interventions Self-care/ADL training;Neuromuscular  education;Therapeutic activities;Patient/family education;DME and/or AE instruction;Visual/perceptual remediation/compensation    Consulted and Agree with Plan of Care Patient             Patient will benefit from skilled therapeutic intervention in order to improve the following deficits and impairments:   Body Structure / Function / Physical Skills: ADL, IADL, FMC, ROM, Strength       Visit Diagnosis: Muscle weakness (generalized)  Other lack of coordination  Cognitive communication deficit    Problem List Patient Active Problem List   Diagnosis Date Noted   Multiple lung nodules 03/27/2021   Dysarthria and anarthria 02/11/2021   Sinus tachycardia    Labile blood glucose    Dysphagia    Seizure prophylaxis    Malnutrition of moderate degree 01/23/2021   Anoxic brain injury ([place]) 01/10/2021   Prediabetes    Dyslipidemia    S/P percutaneous endoscopic gastrostomy (PEG) tube placement ([place])    Status post tracheostomy ([place])    Aspiration pneumonia of both lower lobes due to gastric secretions ([place]) 01/05/2021   Acute respiratory failure (H[facility])    Hypoxic ischemic encephalopathy    Endotracheal tube present    Cardiac arrest ([place]) 12/02/2020   Essential hypertension 12/09/2011    Harrel Carina, MS, OTR/L 08/08/2021, 6:00 PM  [place] [address] [place], [place], [address] Phone: [phone]   Fax:  [phone]  Name: [name] [name] MRN: [id number] Date of Birth: October 17, 1966

## 2021-08-09 NOTE — Therapy (Signed)
[place] MAIN [facility] SERVICES [address] [place], [place], [address] Phone: [phone]   Fax:  [phone]  Speech Language Pathology Treatment Speech Therapy Progress Note   Dates of reporting period  07/01/2021   to   08/07/2021   Patient Details  Name: [name] [name] MRN: [id number] Date of Birth: December 21, 1965 Referring Provider (SLP): [place], [place]   Encounter Date: 08/07/2021   End of Session - 08/09/21 1810     Visit Number 40    Number of Visits 55    Date for SLP Re-Evaluation 08/21/21    Authorization Type Self Pay    Authorization Time Period 05/28/2021 thru 08/21/2021    Authorization - Visit Number 10    Progress Note Due on Visit 10    SLP Start Time 1500    SLP Stop Time  1600    SLP Time Calculation (min) 60 min    Activity Tolerance Patient tolerated treatment well             Past Medical History:  Diagnosis Date   Anoxic brain injury ([place])    Cardiac arrest ([place])    Cognitive communication deficit    Hyperlipidemia    Hypertension    Seizures ([place])    Ventricular fibrillation ([place])     Past Surgical History:  Procedure Laterality Date   PEG PLACEMENT N/A 12/13/2020   Procedure: PERCUTANEOUS ENDOSCOPIC GASTROSTOMY (PEG) PLACEMENT;  Surgeon: Lesly Rubenstein, MD;  Location: [facility] ENDOSCOPY;  Service: Endoscopy;  Laterality: N/A;   TRACHEOSTOMY TUBE PLACEMENT N/A 12/13/2020   Procedure: TRACHEOSTOMY;  Surgeon: Clyde Canterbury, MD;  Location: [facility] ORS;  Service: ENT;  Laterality: N/A;    There were no vitals filed for this visit.   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 No/denies                   ADULT SLP TREATMENT - 08/09/21 0001       Treatment Provided   Treatment provided Cognitive-Linquistic      Cognitive-Linquistic  Treatment   Treatment focused on Cognition;Patient/family/caregiver education    Skilled Treatment COGNITION: moderate faded to minimal cues to verbal sequence organization tasks at her office; moderate cues to slow rate of speech to increase speech intelligibility at the 2 sentence level to 80%; Education provided to pt and her husband on specific ways to safely increase pt's independence at home              SLP Education - 08/09/21 1809     Education Details ways to increase functional independence at home with dialy tasks    Person(s) Educated Patient    Methods Explanation;Demonstration;Handout    Comprehension Verbalized understanding;Returned demonstration;Need further instruction              SLP Short Term Goals - 08/09/21 1811       SLP SHORT TERM GOAL #2   Title With minimal cues, pt will utilize speech intelligibility strategies to achieve > 90% intelligibility at the sentence level.    Baseline Goal met, upgraded to minimal cues    Time 10    Period --   sessions   Status Revised      SLP SHORT TERM GOAL #3   Title With minimal cues,  pt will utilize compensatory memory strategies to recall therapy sessions for week as well as order of therapy in 8 out of 10 sessions.    Status Achieved      SLP SHORT TERM GOAL #4   Title With minima assistance, pt will complete semi-complex tasks with 75% accuracy.    Baseline Goal met, goal updated    Time 10    Period --   sessions   Status Revised              SLP Long Term Goals - 08/09/21 1814       SLP LONG TERM GOAL #2   Title Pt will independently complete functional problem solving for basic and familiar tasks.    Status Partially Met    Target Date 08/21/21      SLP LONG TERM GOAL #3   Title Pt will demonstrate selective attention in a moderately distracting environment for 15 minutes with < 1 redirection to task.    Status Achieved      SLP LONG TERM GOAL #4   Title With minimal cues, pt will  increase speech intelligibility to > 75% at the conversation level.    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 Moderate assistance was required to "think thru" these different activities to ensure accuracy and safety. Education provided to pt and her husband on safe ways to increase pt's functional independence at home.    Speech Therapy Frequency 2x / week    Duration 12 weeks    Treatment/Interventions Cognitive reorganization;Compensatory techniques;Internal/external aids;SLP instruction and feedback;Patient/family education;Functional tasks;Compensatory strategies    Potential to Achieve Goals Good    Potential Considerations Severity of impairments;Ability to learn/carryover information    Consulted and Agree with Plan of Care Patient             Patient will benefit from skilled therapeutic intervention in order to improve the following deficits and impairments:   Cognitive communication deficit  Anoxic brain injury [facility])    Problem List Patient Active Problem List   Diagnosis Date Noted   Multiple lung nodules 03/27/2021   Dysarthria and anarthria 02/11/2021   Sinus tachycardia    Labile blood glucose    Dysphagia    Seizure prophylaxis    Malnutrition of moderate degree 01/23/2021   Anoxic brain injury ([place]) 01/10/2021   Prediabetes    Dyslipidemia    S/P percutaneous endoscopic gastrostomy (PEG) tube placement [facility])    Status post tracheostomy ([place])    Aspiration pneumonia of both lower lobes due to gastric secretions ([place]) 01/05/2021   Acute respiratory failure (H[facility])    Hypoxic ischemic encephalopathy    Endotracheal tube present    Cardiac arrest ([place]) 12/02/2020   Essential hypertension 12/09/2011    Imanii Gosdin B. Rutherford Nail M.S., CCC-SLP, [place] Pathologist Rehabilitation Services Office [phone]  Stormy Fabian 08/09/2021, [place] MAIN [facility] SERVICES [address] [place], [place], [address] Phone: [phone]   Fax:  [phone]   Name: GLORIA RICARDO MRN: [id number] Date of Birth: 06/30/1966

## 2021-08-11 ENCOUNTER — Ambulatory Visit
Admission: RE | Admit: 2021-08-11 | Discharge: 2021-08-11 | Disposition: A | Discharge status: Home / Self Care | Payer: Self-pay | Source: Ambulatory Visit | Attending: Oncology | Admitting: Oncology

## 2021-08-11 ENCOUNTER — Other Ambulatory Visit: Payer: Self-pay

## 2021-08-11 DIAGNOSIS — R911 Solitary pulmonary nodule: Secondary | ICD-10-CM | POA: Insufficient documentation

## 2021-08-11 IMAGING — CT CT CHEST W/O CM
2 of 4 series · 15 of 36 positions shown, 18 images · non-contrast
Comparison: Chest CT [DATE]

CLINICAL DATA: Follow up lung nodule.

EXAM:
CT CHEST WITHOUT CONTRAST
TECHNIQUE: Multidetector CT imaging of the chest was performed following the
standard protocol without IV contrast.

[Series 2: chest 2.00 · axial · 0.67mm/px · z∈[-1188,-924]mm · 12 of 157 slices shown, 15 images]
[im 13/157  mediastinal]
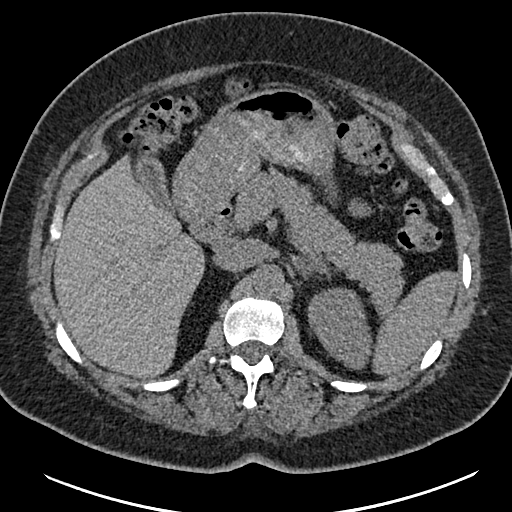
[im 13/157  lung]
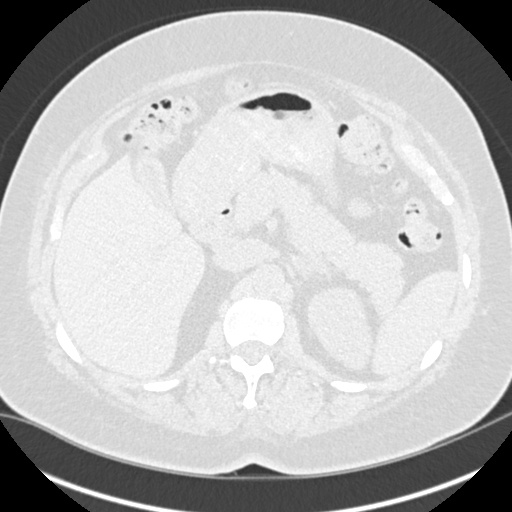
[im 25/157  lung]
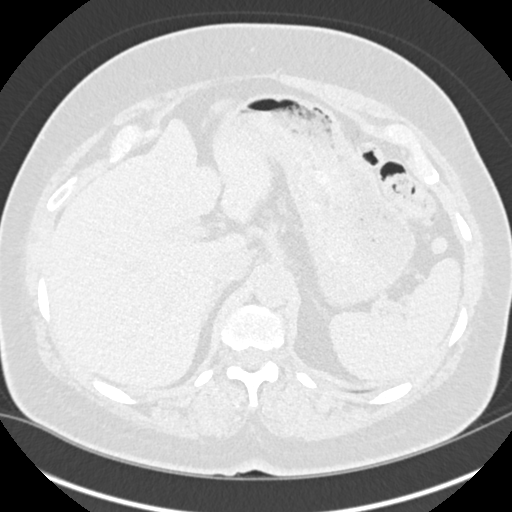
[im 37/157  lung]
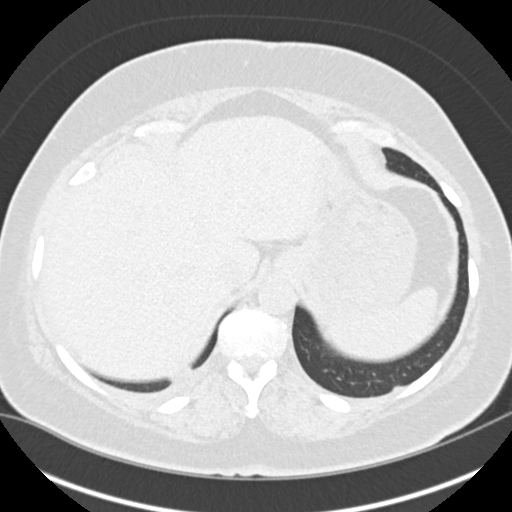
[im 49/157  lung]
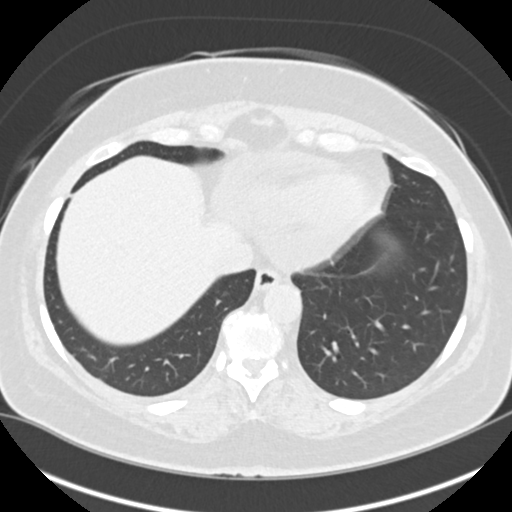
[im 61/157  mediastinal]
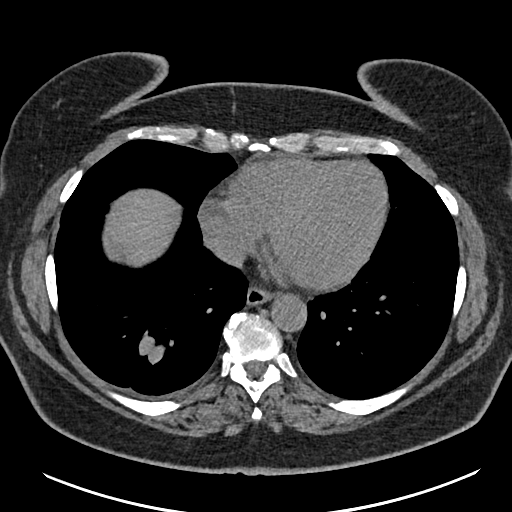
[im 61/157  lung]
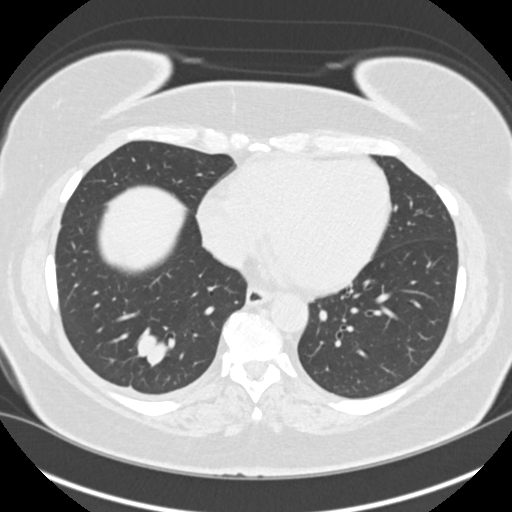
[im 73/157  lung]
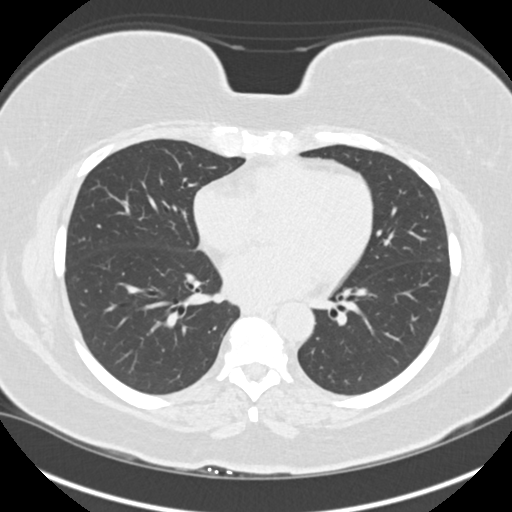
[im 85/157  lung]
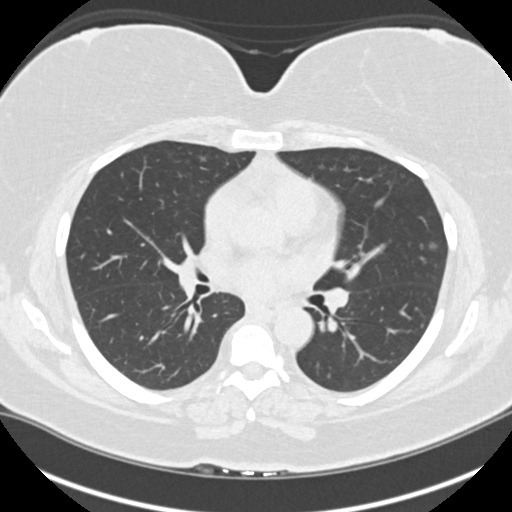
[im 97/157  lung]
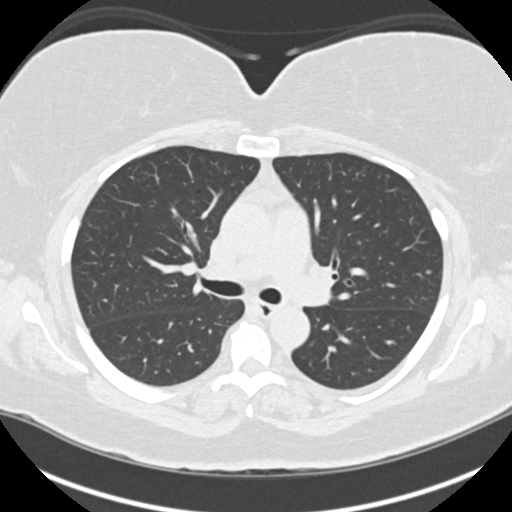
[im 109/157  mediastinal]
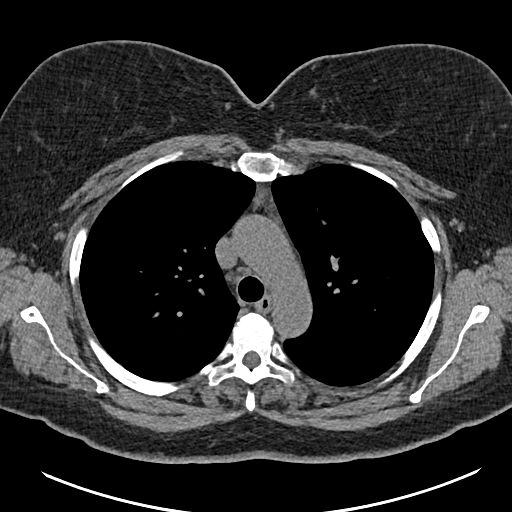
[im 109/157  lung]
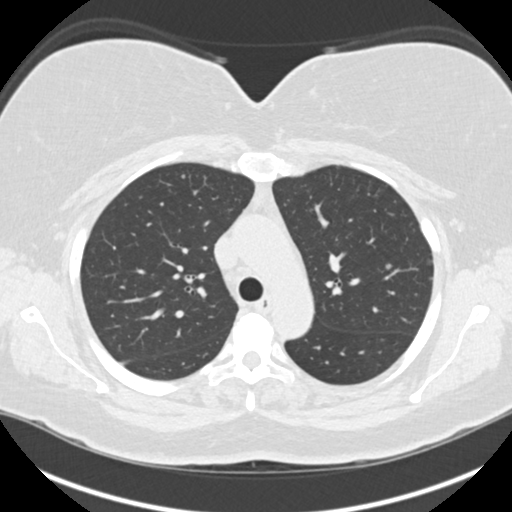
[im 121/157  lung]
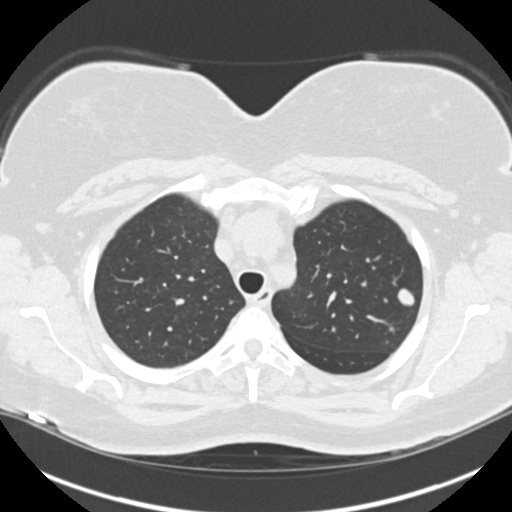
[im 133/157  lung]
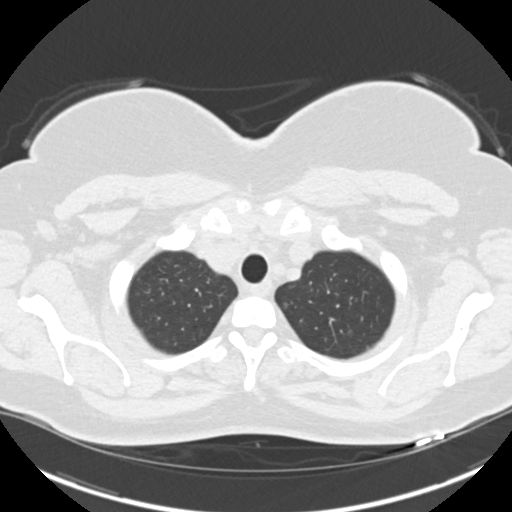
[im 145/157  lung]
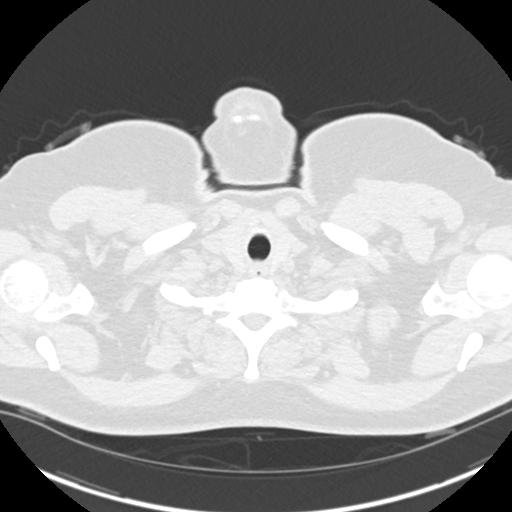

[Series 5: coronals chest 2.00 cor · coronal · 0.61mm/px · 3 of 152 slices shown]
[im 31/152  lung]
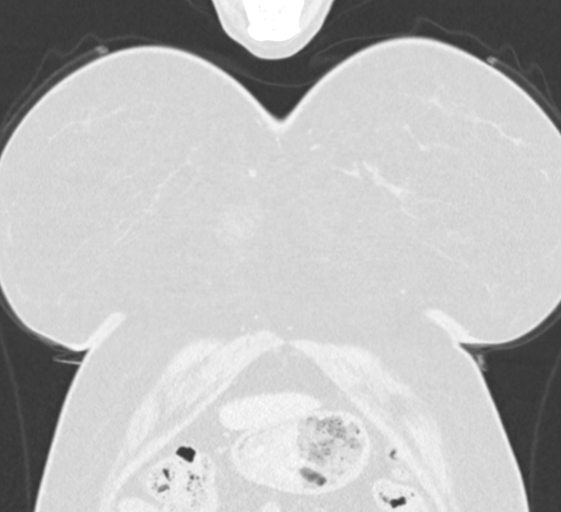
[im 61/152  lung]
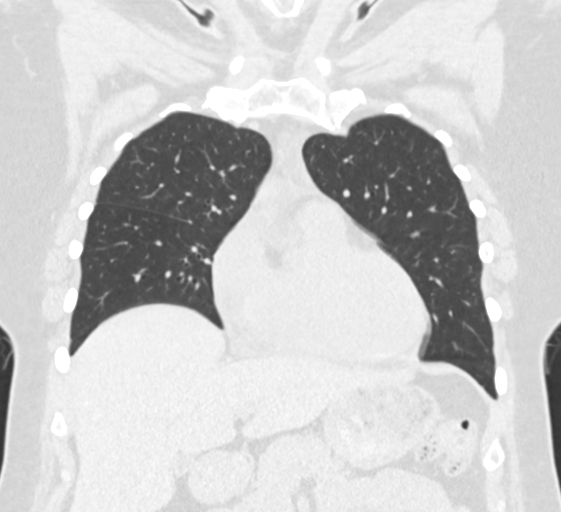
[im 91/152  lung]
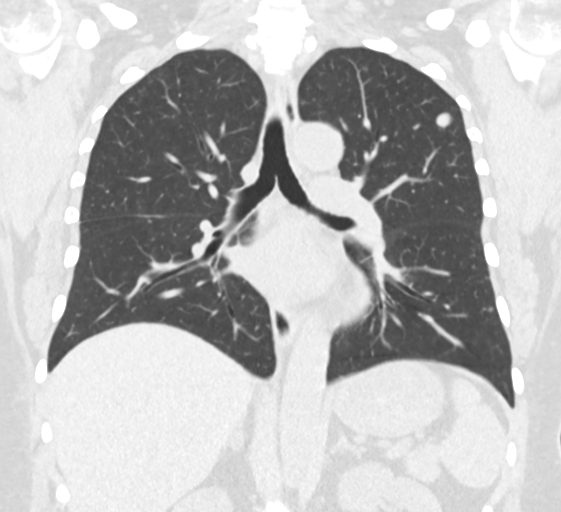

[15 of 36 positions shown; findings below may reference images not displayed]

FINDINGS: Cardiovascular: Minimal atherosclerotic calcification along the
aortic arch.

Mediastinum/Nodes: Small mediastinal lymph nodes are not currently
pathologically enlarged.

Lungs/Pleura: Trace right pleural effusion.

1.2 by 0.8 cm left upper lobe pulmonary nodule on image 37 series 3,
no change from PET-CT dated [DATE].

Branching peribronchovascular nodule in the right lower lobe
measures 1.9 cm in short axis diameter on image 96 series 3, stable
from [DATE].

Numerous scattered bilateral subcentimeter ground-glass density and
solid pulmonary nodules, not changed from [DATE] and in the
covered portion of the lungs in [facility], not changed from [DATE].
No new or enlarging nodules are identified.

Upper Abdomen: Left upper adrenal mass 1.1 by 1.4 cm, density 3
Hounsfield units, compatible with adenoma. Lower left adrenal mass
1.6 by 1.7 cm, internal density of 3 Hounsfield units compatible
with adrenal adenoma.

Musculoskeletal: Thoracic spondylosis.
IMPRESSION: 1. Stable bilateral pulmonary nodules, including the individual
dominant nodules in the right lower lobe and left upper lobe, as
well as scattered sub-centimeter solid and sub solid pulmonary
nodules. These nodules were previously not substantially
hypermetabolic and as such could represent postinflammatory findings
or low-grade malignancy. If the patient has no history of
malignancy, and assuming the workup of the uterine masses has been
negative, then I would favor surveillance noncontrast chest CT in 12
months. If the patient does have a history of malignancy, than
closer surveillance may be indicated.
2. Trace right pleural effusion, etiology uncertain.
3. Minimal atherosclerosis.
4. Left adrenal adenomas.

## 2021-08-12 ENCOUNTER — Encounter: Payer: Self-pay | Admitting: Occupational Therapy

## 2021-08-12 ENCOUNTER — Ambulatory Visit: Payer: Self-pay | Admitting: Speech Pathology

## 2021-08-12 ENCOUNTER — Ambulatory Visit: Payer: Self-pay | Admitting: Occupational Therapy

## 2021-08-12 DIAGNOSIS — R41841 Cognitive communication deficit: Secondary | ICD-10-CM

## 2021-08-12 DIAGNOSIS — G931 Anoxic brain damage, not elsewhere classified: Secondary | ICD-10-CM

## 2021-08-12 DIAGNOSIS — R278 Other lack of coordination: Secondary | ICD-10-CM

## 2021-08-12 DIAGNOSIS — M6281 Muscle weakness (generalized): Secondary | ICD-10-CM

## 2021-08-12 NOTE — Therapy (Signed)
[place] MAIN [facility] SERVICES [address] [place], [place], [address] Phone: [phone]   Fax:  [phone]  Occupational Therapy Treatment  Patient Details  Name: [name] [name] MRN: [id number] Date of Birth: 05-25-1966 No data recorded  Encounter Date: 08/12/2021   OT End of Session - 08/12/21 1745     Visit Number 35    Number of Visits 48    Date for OT Re-Evaluation 08/21/21    Authorization Type Progress reports period starting 02/11/2021    OT Start Time 1604    OT Stop Time 1645    OT Time Calculation (min) 41 min    Activity Tolerance Patient tolerated treatment well    Behavior During Therapy [facility] for tasks assessed/performed             Past Medical History:  Diagnosis Date   Anoxic brain injury ([place])    Cardiac arrest ([place])    Cognitive communication deficit    Hyperlipidemia    Hypertension    Seizures ([place])    Ventricular fibrillation ([place])     Past Surgical History:  Procedure Laterality Date   PEG PLACEMENT N/A 12/13/2020   Procedure: PERCUTANEOUS ENDOSCOPIC GASTROSTOMY (PEG) PLACEMENT;  Surgeon: Lesly Rubenstein, MD;  Location: [facility] ENDOSCOPY;  Service: Endoscopy;  Laterality: N/A;   TRACHEOSTOMY TUBE PLACEMENT N/A 12/13/2020   Procedure: TRACHEOSTOMY;  Surgeon: Clyde Canterbury, MD;  Location: [facility] ORS;  Service: ENT;  Laterality: N/A;    There were no vitals filed for this visit.   Subjective Assessment - 08/12/21 1744     Subjective  Patient reports its good to be back at the office    Patient is accompanied by: Family member    Pertinent History Pt. is a 55 y.o. female who went into cardiac arrest, and was admitted to [facility] on 12/02/2020. Pt. was diagnosed with Anoxic Brain Injury following the Cardiac Arrest. When medically stable, the pt. was tranferred to [place] rehab. The patient was discharged home on 02/08/2021, and is ready to begin outpatient OT services. Pt. was independent, and  working prior to the onset. Pt. Has very supportive family. Pt. Enjoys gardening.    Currently in Pain? No/denies            Patient worked on bilateral hand fine motor coordination skills to construct a soft globe.  Patient worked on using her right hand to press the globe core and shell from the template. Patient  used bilateral hand function, and coordination to stabilize, and construct the base, anf core of the globe. Pt. worked on grasping pressing and pulling the light foam pieces to secure the small foam tabs in place.  Patient required verbal cues and step-by-step instruction, as well as assist to stabilize each section.  Patient continues to work on improving lateral hand function, and fine motor coordination skills in order to increase upper extremity engagement in ADLs, and IADL tasks.                      OT Education - 08/12/21 1745     Education Details visual perceptual skills, [facility]    Person(s) Educated Patient    Methods Explanation    Comprehension Verbalized understanding;Returned demonstration                 OT Long Term Goals - 07/24/21 1617       OT LONG TERM GOAL #1   Title Pt. will be  able to independently sign her name with 100% legibility.    Baseline Pt. continues to present with limited legibility formulating her signature with inconsistent spacing between her first, and last name. Eval: Signature not legible, however was able to sign on the line with no deviation above, or below the line.  Recert: Signature with 75% legibility. Spacing words on a line is impaired and requires extensive cueing to scan L to R across the paper. 20th visit: 75% legible printed, 50% signature.    Time 12    Period Weeks    Status On-going    Target Date 08/21/21      OT LONG TERM GOAL #2   Title .      OT LONG TERM GOAL #3   Title Pt. will increase right grip strength by 5# to be able hold a full glass    Baseline 07/24/2021:  Pt. is making progress  with opening containers., however has difficulty opening soda bottles. Pt. is improving with formulating a composite fist. Eval: limited grip strength. Limited bilateral digit flexion, unable to make a full composite fist. 20th visit: Pt. is making excellent progress with bilateral grip strengt. Pt. is able to hold, a d drink from a full glass.    Time 12    Period Weeks    Status Revised    Target Date 08/21/21      OT LONG TERM GOAL #4   Title Pt. will independently access a computer, and type a one sentence email correspondence.    Baseline 30th visit: Pt. is now starting to send work related emails, that her daughter reviews before sending..20th visit: Pt. continues to have difficulty tying a sentence leggibly. Pt. is able to type a one sentence correspondence with multiple mistypes. Eval: Pt. is unable to to perform    Time 12    Period Weeks    Status On-going    Target Date 08/21/21      OT LONG TERM GOAL #5   Title Pt. will improve bilateral [facility] skills to be able to grasp, and manipulate small objects during ADLs.    Baseline 30th visit: Pt. continues to present with  [facility] limitiations dropping multiple small objects from her hand during translatory movements of the hand. 20th visit: Pt. has made excellent progress with bialteral [facility] skills, and grasping small objects. Pt. continues to have difficulty grasping, and manipulating small objects. Eval: Pt. presents with limited [facility] skills with difficulty picking,and manipulating small objects.    Time 12    Period Weeks    Status On-going    Target Date 08/21/21      OT LONG TERM GOAL #6   Title Pt. will improve UE strength by 2 mm grades to assist with ADLs, and IADLS,    Baseline 14ER visit: BUE strength: 4+/5 20th visit: BUE strength; 4+/5 overall. Pt. continues to present with limited BUE stregth. Pt. is engaging in more ADL,a nd IADL tasks at home. Eval: pt. has difficulty performing.    Time 12    Period Weeks    Status On-going       OT LONG TERM GOAL #7   Title Pt. will complete light meal preparation with minA    Baseline 30th visit: Pt. is able to prepare  items for cooking, and make cold light meals, sandwiches. Pt. does not use the stovetop or oven.  06/16/2021: Pt. is assisting her daughter with cooking chiscken, bacon, slamon, and grits. Pt. is preparing meals with her  daughter's assist. Eval: Pt. is unable to prepare light meal preparation.    Time 12    Period Weeks    Status On-going    Target Date 08/21/21      OT LONG TERM GOAL #8   Title Pt. will perform light home management tasks with minA    Baseline 30th visit: pt. continues to participate in more  home management tasks. 06/16/2021: Pt. is independent with bedmaking, vacuuming, dishes, dusting, sweeping the porch. Pt. is independent with her, and her husband's laundry. Pt.'s daughter helps her down the outdoor steps [place] of the stones. Pt. is engaging in more home management tasks. Eval: pt. is unable to perform    Time 12    Period Weeks    Status On-going    Target Date 08/21/21      OT LONG TERM GOAL  #9   TITLE Pt will demonstrate improved visual/spatial skills as demonstrated by ability to adequately space words in a sentence with min vc.    Baseline 30th visit: Pt. continues to require cues, and assist for spacing between words when formulating sentences.  20th visit: Pt. requires cues, and assist for spacing when writing sentences.    Time 12    Period Weeks    Status On-going    Target Date 08/21/21                   Plan - 08/12/21 1746     Clinical Impression Statement Patient worked on bilateral hand fine motor coordination skills to construct a soft globe.  Patient worked on using her right hand to press the globe core and shell from the template. Patient  used bilateral hand function, and coordination to stabilize, and construct the base, anf core of the globe. Pt. worked on grasping pressing and pulling the light foam  pieces to secure the small foam tabs in place.  Patient required verbal cues and step-by-step instruction, as well as assist to stabilize each section.  Patient continues to work on improving lateral hand function, and fine motor coordination skills in order to increase upper extremity engagement in ADLs, and IADL tasks.   OT Occupational Profile and History Problem Focused Assessment - Including review of records relating to presenting problem    Occupational performance deficits (Please refer to evaluation for details): ADL's;IADL's    Body Structure / Function / Physical Skills ADL;IADL;FMC;ROM;Strength    Rehab Potential Good    Clinical Decision Making Several treatment options, min-mod task modification necessary    Comorbidities Affecting Occupational Performance: May have comorbidities impacting occupational performance    Modification or Assistance to Complete Evaluation  Min-Moderate modification of tasks or assist with assess necessary to complete eval    OT Frequency 2x / week    OT Duration 12 weeks    OT Treatment/Interventions Self-care/ADL training;Neuromuscular education;Therapeutic activities;Patient/family education;DME and/or AE instruction;Visual/perceptual remediation/compensation    Consulted and Agree with Plan of Care Patient             Patient will benefit from skilled therapeutic intervention in order to improve the following deficits and impairments:   Body Structure / Function / Physical Skills: ADL, IADL, FMC, ROM, Strength       Visit Diagnosis: Muscle weakness (generalized)  Other lack of coordination    Problem List Patient Active Problem List   Diagnosis Date Noted   Multiple lung nodules 03/27/2021   Dysarthria and anarthria 02/11/2021   Sinus tachycardia    Labile blood glucose  Dysphagia    Seizure prophylaxis    Malnutrition of moderate degree 01/23/2021   Anoxic brain injury ([place]) 01/10/2021   Prediabetes    Dyslipidemia    S/P  percutaneous endoscopic gastrostomy (PEG) tube placement [facility])    Status post tracheostomy ([place])    Aspiration pneumonia of both lower lobes due to gastric secretions ([place]) 01/05/2021   Acute respiratory failure ([place])    Hypoxic ischemic encephalopathy    Endotracheal tube present    Cardiac arrest ([place]) 12/02/2020   Essential hypertension 12/09/2011    Harrel Carina, MS, OTR/L 08/12/2021, 5:55 PM  [place] MAIN [facility] SERVICES [address] [place], [place], [address] Phone: [phone]   Fax:  [phone]  Name: [name] [name] MRN: [id number] Date of Birth: 1966-01-22

## 2021-08-13 ENCOUNTER — Inpatient Hospital Stay (HOSPITAL_BASED_OUTPATIENT_CLINIC_OR_DEPARTMENT_OTHER): Payer: Self-pay | Admitting: Oncology

## 2021-08-13 VITALS — BP 134/76 | HR 75 | Temp 97.8°F | Resp 18 | Wt 197.0 lb

## 2021-08-13 DIAGNOSIS — R918 Other nonspecific abnormal finding of lung field: Secondary | ICD-10-CM

## 2021-08-13 NOTE — Progress Notes (Signed)
Pulmonary Nodule Clinic Consult note [facility]  Telephone:([phone] [phone]  Patient Care Team: Marinda Elk, MD as PCP - General (Physician Assistant) Minna Merritts, MD as PCP - Cardiology (Cardiology) Clent Jacks, RN as Oncology Nurse Navigator   Name of the patient: [name] [name]  [id number]  [name] [name]   Date of visit: 08/13/2021   Diagnosis- Lung Nodule  Chief complaint/ Reason for visit- Pulmonary Nodule Clinic Initial Visit  Past Medical History: [name] [name] is a 54 year old female with past medical history significant for hypertension, acute respiratory failure, dysphagia, brain injury, cardiac arrest, dyslipidemia, seizure prophylaxis, previous tracheostomy who had a CT angio on 04/17/2021 for follow-up for previously seen pulmonary nodules.  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  Previously she has had a PET scan which did not reveal any hypermetabolic activity.  She is also followed by gynecology oncology and met with Dr. Theora Gianotti on 08/06/2021 for a uterine myoma discovered during PET scan.  She recommended MRI of her pelvis to return in 3 months.  She is not symptomatic which is reassuring.  Interval history-today patient presents to discuss her most recent CT scan.  Patient reports overall doing well.  She is not a current smoker although she did smoke for approximately 7 years smoking 1 pack of cigarettes every 2 to 3 days.  She quit smoking in 2014.  She denies a family history of cancer although her aunt did have a "cancer scare".  She is unsure of the type.  She did have COVID in August 2022 and this was her first time.  She has been vaccinated.  She denies any exposure to toxins known to cause cancer.  She denies any current concerns including  respiratory issues.  ECOG FS:1 - Symptomatic but completely ambulatory  Review of systems- Review of Systems  Constitutional:  Positive for malaise/fatigue. Negative for chills, fever and weight loss.  HENT:  Negative for congestion, ear pain and tinnitus.   Eyes: Negative.  Negative for blurred vision and double vision.  Respiratory: Negative.  Negative for cough, sputum production and shortness of breath.   Cardiovascular: Negative.  Negative for chest pain, palpitations and leg swelling.  Gastrointestinal: Negative.  Negative for abdominal pain, constipation, diarrhea, nausea and vomiting.  Genitourinary:  Negative for dysuria, frequency and urgency.  Musculoskeletal:  Negative for back pain and falls.  Skin: Negative.  Negative for rash.  Neurological:  Positive for weakness. Negative for headaches.  Endo/Heme/Allergies: Negative.  Does not bruise/bleed easily.  Psychiatric/Behavioral: Negative.  Negative for depression. The patient is not nervous/anxious and does not have insomnia.     No Known Allergies   Past Medical History:  Diagnosis Date   Anoxic brain injury ([place])    Cardiac arrest ([place])    Cognitive communication deficit    Hyperlipidemia    Hypertension    Seizures ([place])    Ventricular fibrillation ([place])      Past Surgical History:  Procedure Laterality Date   PEG PLACEMENT N/A 12/13/2020   Procedure: PERCUTANEOUS ENDOSCOPIC GASTROSTOMY (PEG) PLACEMENT;  Surgeon: Lesly Rubenstein, MD;  Location: [facility] ENDOSCOPY;  Service: Endoscopy;  Laterality: N/A;   TRACHEOSTOMY TUBE PLACEMENT N/A 12/13/2020   Procedure: TRACHEOSTOMY;  Surgeon: Clyde Canterbury, MD;  Location: [facility] ORS;  Service: ENT;  Laterality: N/A;  Social History   Socioeconomic History   Marital status: Married    Spouse name: Not on file   Number of children: Not on file   Years of education: Not on file   Highest education level: Not on file  Occupational History   Not on file  Tobacco Use    Smoking status: Former    Types: Cigarettes    Quit date: 2007    Years since quitting: 15.7   Smokeless tobacco: Never  Vaping Use   Vaping Use: Never used  Substance and Sexual Activity   Alcohol use: No   Drug use: Never   Sexual activity: Not on file  Other Topics Concern   Not on file  Social History Narrative   QUIT smoke [2001]; no alcohol; lives in [place] with husband; 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    Social Determinants of Health   Financial Resource Strain: Not on file  Food Insecurity: Not on file  Transportation Needs: Not on file  Physical Activity: Not on file  Stress: Not on file  Social Connections: Not on file  Intimate Partner Violence: Not on file    Family History  Problem Relation Age of Onset   Hypertension Mother    Leukemia Mother    Diabetes Mother    Healthy Father      Current Outpatient Medications:    amLODipine (NORVASC) 10 MG tablet, Take 1 tablet (10 mg total) by mouth daily., Disp: 30 tablet, Rfl: 5   atorvastatin (LIPITOR) 80 MG tablet, Take 1 tablet (80 mg total) by mouth daily., Disp: 30 tablet, Rfl: 5   metoprolol tartrate (LOPRESSOR) 25 MG tablet, Take 1 tablet (25 mg total) by mouth 2 (two) times daily., Disp: 60 tablet, Rfl: 5   potassium chloride SA (KLOR-CON M20) 20 MEQ tablet, Take 2 tablets (40 mEq total) by mouth daily., Disp: 60 tablet, Rfl: 5   saccharomyces boulardii (FLORASTOR) 250 MG capsule, Take 1 capsule (250 mg total) by mouth 2 (two) times daily., Disp: 60 capsule, Rfl: 0  Physical exam:  Vitals:   08/13/21 1027 08/13/21 1029 08/13/21 1030  BP:  (!) 145/101 134/76  Pulse:  75   Resp:  18   Temp:  97.8 F (36.6 C)   TempSrc:  Tympanic   SpO2:  100%   Weight: 197 lb (89.4 kg)     Physical Exam   CMP Latest Ref Rng & Units 04/17/2021  Glucose 70 - 99 mg/dL -  BUN 6 - 20 mg/dL -  Creatinine 0.44 - 1.00 mg/dL 0.40(L)  Sodium 135 - 145 mmol/L -  Potassium 3.5 - 5.1 mmol/L -  Chloride 98 - 111 mmol/L -   CO2 22 - 32 mmol/L -  Calcium 8.9 - 10.3 mg/dL -  Total Protein 6.5 - 8.1 g/dL -  Total Bilirubin 0.3 - 1.2 mg/dL -  Alkaline Phos 38 - 126 U/L -  AST 15 - 41 U/L -  ALT 0 - 44 U/L -   CBC Latest Ref Rng & Units 02/03/2021  WBC 4.0 - 10.5 K/uL 5.8  Hemoglobin 12.0 - 15.0 g/dL 10.7(L)  Hematocrit 36.0 - 46.0 % 33.5(L)  Platelets 150 - 400 K/uL 181    No images are attached to the encounter.  CT Chest Wo Contrast  Result Date: 08/12/2021 CLINICAL DATA:  Follow up lung nodule. EXAM: CT CHEST WITHOUT CONTRAST TECHNIQUE: Multidetector CT imaging of the chest was performed following the standard protocol without IV contrast. COMPARISON:  Chest  CT 04/17/2021 FINDINGS: Cardiovascular: Minimal atherosclerotic calcification along the aortic arch. Mediastinum/Nodes: Small mediastinal lymph nodes are not currently pathologically enlarged. Lungs/Pleura: Trace right pleural effusion. 1.2 by 0.8 cm left upper lobe pulmonary nodule on image 37 series 3, no change from PET-CT dated 03/19/2021. Branching peribronchovascular nodule in the right lower lobe measures 1.9 cm in short axis diameter on image 96 series 3, stable from 03/19/2021. Numerous scattered bilateral subcentimeter ground-glass density and solid pulmonary nodules, not changed from 04/17/2021 and in the covered portion of the lungs in April, not changed from 02/27/2021. No new or enlarging nodules are identified. Upper Abdomen: Left upper adrenal mass 1.1 by 1.4 cm, density 3 Hounsfield units, compatible with adenoma. Lower left adrenal mass 1.6 by 1.7 cm, internal density of 3 Hounsfield units compatible with adrenal adenoma. Musculoskeletal: Thoracic spondylosis. IMPRESSION: 1. Stable bilateral pulmonary nodules, including the individual dominant nodules in the right lower lobe and left upper lobe, as well as scattered sub-centimeter solid and sub solid pulmonary nodules. These nodules were previously not substantially hypermetabolic and as such  could represent postinflammatory findings or low-grade malignancy. If the patient has no history of malignancy, and assuming the workup of the uterine masses has been negative, then I would favor surveillance noncontrast chest CT in 12 months. If the patient does have a history of malignancy, than closer surveillance may be indicated. 2. Trace right pleural effusion, etiology uncertain. 3. Minimal atherosclerosis. 4. Left adrenal adenomas. Electronically Signed   By: Van Clines M.D.   On: 08/12/2021 07:58     Assessment and plan- Patient is a 55 y.o. female who presents to pulmonary nodule clinic for follow-up of incidental lung nodules.     CT chest without contrast from 08/11/2021 showed stable bilateral pulmonary nodules including the dominant nodule in the right lower lobe and left upper lobe.  These nodules are previously not substantially hypermetabolic and as of such could represent postinflammatory findings or low-grade malignancy.  If the patient has no history of malignancy, and uterine mass is indeed negative then surveillance with a noncontrast chest CT in 12 months would be indicated.  If she does indeed have uterine cancer, closer surveillance would be recommended.  Patient is very low risk for the development of lung cancer from smoking.  She smoked for about 7 years at 1 pack of cigarettes per 2 to 3 days.  For now, we will set her up for an annual CT scan but will continue to periodically touch base with patient until her MRI in the next couple months.    We discussed the probability of pulmonary nodules becoming malignant increase with age, pack years of tobacco use, size/characteristics of the nodule and location; with upper lobe involvement being most worrisome.   We discussed the goal of our clinic is to thoroughly evaluate each nodule, developed a comprehensive, individualized plan of care utilizing the most advanced technology and significantly reduce the time from detection  to treatment.  A dedicated pulmonary nodule clinic has proven to indeed expedite the detection and treatment of lung cancer.   Patient education in fact sheet provided along with most recent CT scans.  Plan- Mrs. Sampley is asymptomatic of her pulmonary nodules.  Previously PET scan was negative for hypermetabolic activity in her lungs.  She is currently undergoing work-up for a uterine mass with Dr. Theora Gianotti and plan is to have an MRI in the next couple months.  For now, I will plan to repeat her CT scan in 12  months unless uterine mass does indeed turn out to be cancer.  Patient is in agreement.   Visit Diagnosis 1. Multiple lung nodules     Patient expressed understanding and was in agreement with this plan. She also understands that She can call clinic at any time with any questions, concerns, or complaints.   I spent 15 minutes dedicated to the care of this patient (face-to-face and non-face-to-face) on the date of the encounter to include what is described in the assessment and plan.   Thank you for allowing me to participate in the care of this very pleasant patient.    Jacquelin Hawking, NP [place] at [facility] Cell - [phone] Pager- [phone] 08/13/2021 11:04 AM

## 2021-08-14 ENCOUNTER — Other Ambulatory Visit: Payer: Self-pay

## 2021-08-14 ENCOUNTER — Ambulatory Visit: Payer: Self-pay | Admitting: Occupational Therapy

## 2021-08-14 ENCOUNTER — Ambulatory Visit: Payer: Self-pay | Admitting: Speech Pathology

## 2021-08-14 DIAGNOSIS — R278 Other lack of coordination: Secondary | ICD-10-CM

## 2021-08-14 DIAGNOSIS — M6281 Muscle weakness (generalized): Secondary | ICD-10-CM

## 2021-08-14 NOTE — Therapy (Signed)
[place] MAIN [facility] SERVICES [address] [place], [place], [address] Phone: [phone]   Fax:  [phone]  Speech Language Pathology Treatment  Patient Details  Name: [name] [name] MRN: [id number] Date of Birth: Jun 17, 1966 Referring Provider (SLP): Mar[place]e [place], [place]   Encounter Date: 08/12/2021   End of Session - 08/14/21 1059     Visit Number 41    Number of Visits 34    Date for SLP Re-Evaluation 08/21/21    Authorization Type Self Pay    Authorization Time Period 05/28/2021 thru 08/21/2021    Authorization - Visit Number 1    Progress Note Due on Visit 10    SLP Start Time 1500    SLP Stop Time  1600    SLP Time Calculation (min) 60 min    Activity Tolerance Patient tolerated treatment well             Past Medical History:  Diagnosis Date   Anoxic brain injury ([place])    Cardiac arrest ([place])    Cognitive communication deficit    Hyperlipidemia    Hypertension    Seizures ([place])    Ventricular fibrillation ([place])     Past Surgical History:  Procedure Laterality Date   PEG PLACEMENT N/A 12/13/2020   Procedure: PERCUTANEOUS ENDOSCOPIC GASTROSTOMY (PEG) PLACEMENT;  Surgeon: Lesly Rubenstein, MD;  Location: [facility] ENDOSCOPY;  Service: Endoscopy;  Laterality: N/A;   TRACHEOSTOMY TUBE PLACEMENT N/A 12/13/2020   Procedure: TRACHEOSTOMY;  Surgeon: Clyde Canterbury, MD;  Location: [facility] ORS;  Service: ENT;  Laterality: N/A;    There were no vitals filed for this visit.   Subjective Assessment - 08/14/21 1057     Subjective "work was wonderful" and "I made a pound cake"    Currently in Pain? No/denies                   ADULT SLP TREATMENT - 08/14/21 0001       Treatment Provided   Treatment provided Cognitive-Linquistic      Cognitive-Linquistic Treatment   Treatment focused on Cognition;Patient/family/caregiver education    Skilled Treatment COGNITION: pt organized BID pill box with 1 error with improved  ability to problem solve error correctly with only initial supervision cues (immense progress made), scanning semi- complex task - 86% (25/29) improving to 100% with minimal cues, scanning complex task - 53% (10/19) improving to 100% with moderate multimodal assistance              SLP Education - 08/14/21 1058     Education Details divided attention task    Person(s) Educated Patient    Methods Explanation;Demonstration    Comprehension Verbalized understanding;Returned demonstration;Need further instruction              SLP Short Term Goals - 08/09/21 1811       SLP SHORT TERM GOAL #2   Title With minimal cues, pt will utilize speech intelligibility strategies to achieve > 90% intelligibility at the sentence level.    Baseline Goal met, upgraded to minimal cues    Time 10    Period --   sessions   Status Revised      SLP SHORT TERM GOAL #3   Title With minimal cues, pt will utilize compensatory memory strategies to recall therapy sessions for week as well as order of therapy in 8 out of 10 sessions.    Status Achieved      SLP SHORT TERM GOAL #4  Title With minima assistance, pt will complete semi-complex tasks with 75% accuracy.    Baseline Goal met, goal updated    Time 10    Period --   sessions   Status Revised              SLP Long Term Goals - 08/09/21 1814       SLP LONG TERM GOAL #2   Title Pt will independently complete functional problem solving for basic and familiar tasks.    Status Partially Met    Target Date 08/21/21      SLP LONG TERM GOAL #3   Title Pt will demonstrate selective attention in a moderately distracting environment for 15 minutes with < 1 redirection to task.    Status Achieved      SLP LONG TERM GOAL #4   Title With minimal cues, pt will increase speech intelligibility to > 75% at the conversation level.    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 Moderate assistance was required to "think thru" these different activities to ensure accuracy and safety. Education provided to pt and her husband on safe ways to increase pt's functional independence at home.    Speech Therapy Frequency 2x / week    Duration 12 weeks    Treatment/Interventions Cognitive reorganization;Compensatory techniques;Internal/external aids;SLP instruction and feedback;Patient/family education;Functional tasks;Compensatory strategies    Potential to Achieve Goals Good    Consulted and Agree with Plan of Care Patient             Patient will benefit from skilled therapeutic intervention in order to improve the following deficits and impairments:   Cognitive communication deficit  Anoxic brain injury [facility])    Problem List Patient Active Problem List   Diagnosis Date Noted   Multiple lung nodules 03/27/2021   Dysarthria and anarthria 02/11/2021   Sinus tachycardia    Labile blood glucose    Dysphagia    Seizure prophylaxis    Malnutrition of moderate degree 01/23/2021   Anoxic brain injury ([place]) 01/10/2021   Prediabetes    Dyslipidemia    S/P percutaneous endoscopic gastrostomy (PEG) tube placement [facility])    Status post tracheostomy ([place])    Aspiration pneumonia of both lower lobes due to gastric secretions ([place]) 01/05/2021   Acute respiratory failure ([facility])    Hypoxic ischemic encephalopathy    Endotracheal tube present    Cardiac arrest ([place]) 12/02/2020   Essential hypertension 12/09/2011   [name] B. Rutherford Nail M.S., CCC-SLP, [place] Pathologist Rehabilitation Services Office [phone]  Stormy Fabian 08/14/2021, [place] MAIN [facility] SERVICES [address] [place], [place], [address] Phone:  [phone]   Fax:  [phone]   Name: [name] [name] MRN: [id number] Date of Birth: 30-Dec-1965

## 2021-08-15 ENCOUNTER — Encounter: Payer: Self-pay | Admitting: Occupational Therapy

## 2021-08-15 NOTE — Therapy (Addendum)
[place] MAIN [facility] SERVICES [address] [place], [place], [address] Phone: [phone]   Fax:  [phone]  Occupational Therapy Treatment  Patient Details  Name: [name] [name] MRN: [id number] Date of Birth: 01-20-1966 No data recorded  Encounter Date: 08/14/2021   OT End of Session - 08/15/21 2005     Visit Number 36    Number of Visits 48    Date for OT Re-Evaluation 08/21/21    Authorization Type Progress reports period starting 02/11/2021    OT Start Time 101    OT Stop Time 1645    OT Time Calculation (min) 45 min    Activity Tolerance Patient tolerated treatment well    Behavior During Therapy [facility] for tasks assessed/performed             Past Medical History:  Diagnosis Date   Anoxic brain injury ([place])    Cardiac arrest ([place])    Cognitive communication deficit    Hyperlipidemia    Hypertension    Seizures ([place])    Ventricular fibrillation ([place])     Past Surgical History:  Procedure Laterality Date   PEG PLACEMENT N/A 12/13/2020   Procedure: PERCUTANEOUS ENDOSCOPIC GASTROSTOMY (PEG) PLACEMENT;  Surgeon: Lesly Rubenstein, MD;  Location: [facility] ENDOSCOPY;  Service: Endoscopy;  Laterality: N/A;   TRACHEOSTOMY TUBE PLACEMENT N/A 12/13/2020   Procedure: TRACHEOSTOMY;  Surgeon: Clyde Canterbury, MD;  Location: [facility] ORS;  Service: ENT;  Laterality: N/A;    There were no vitals filed for this visit.   Subjective Assessment - 08/15/21 2005     Subjective  Patient reports enjoying being back at the office    Pertinent History Pt. is a 55 y.o. female who went into cardiac arrest, and was admitted to [facility] on 12/02/2020. Pt. was diagnosed with Anoxic Brain Injury following the Cardiac Arrest. When medically stable, the pt. was tranferred to [facility] rehab. The patient was discharged home on 02/08/2021, and is ready to begin outpatient OT services. Pt. was independent, and working prior to the onset. Pt. Has very  supportive family. Pt. Enjoys gardening.    Patient Stated Goals Pt would like to be as independent as possible.    Currently in Pain? 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  Patient was able to verbalize the information needed to fill out each portion of the application.  Patient was able to fill out the sections containing the address phone numbers, dates, initials, and [place] number on the appropriate page. Pt. Required verbal cues for spelling  the month September, as patient consistently omitted letters e-r. Pt. Was able to initiate corrections with cues. Patient education was provided about options for simplifying the form with preprinted business address and [facility] numbers which the pt. reports are on each form due provided to the client.  Patient continues to work on improving accuracy, and efficiency with filling out work related application forms.                        OT Education - 08/15/21 2005     Education Details visual perceptual skills, [facility]    Person(s) Educated Patient    Methods Explanation    Comprehension Verbalized understanding;Returned demonstration  OT Long Term Goals - 07/24/21 1617       OT LONG TERM GOAL #1   Title Pt. will be able to independently sign her name with 100% legibility.    Baseline Pt. continues to present with limited legibility formulating her signature with inconsistent spacing between her first, and last name. Eval: Signature not legible, however was able to sign on the line with no deviation above, or below the line.  Recert: Signature with 75% legibility. Spacing words on a line is impaired and requires extensive cueing to scan L to R across the paper. 20th visit: 75% legible printed, 50% signature.    Time 12    Period Weeks    Status On-going    Target Date  08/21/21      OT LONG TERM GOAL #2   Title .      OT LONG TERM GOAL #3   Title Pt. will increase right grip strength by 5# to be able hold a full glass    Baseline 07/24/2021:  Pt. is making progress with opening containers., however has difficulty opening soda bottles. Pt. is improving with formulating a composite fist. Eval: limited grip strength. Limited bilateral digit flexion, unable to make a full composite fist. 20th visit: Pt. is making excellent progress with bilateral grip strengt. Pt. is able to hold, a d drink from a full glass.    Time 12    Period Weeks    Status Revised    Target Date 08/21/21      OT LONG TERM GOAL #4   Title Pt. will independently access a computer, and type a one sentence email correspondence.    Baseline 30th visit: Pt. is now starting to send work related emails, that her daughter reviews before sending..20th visit: Pt. continues to have difficulty tying a sentence leggibly. Pt. is able to type a one sentence correspondence with multiple mistypes. Eval: Pt. is unable to to perform    Time 12    Period Weeks    Status On-going    Target Date 08/21/21      OT LONG TERM GOAL #5   Title Pt. will improve bilateral [facility] skills to be able to grasp, and manipulate small objects during ADLs.    Baseline 30th visit: Pt. continues to present with  [facility] limitiations dropping multiple small objects from her hand during translatory movements of the hand. 20th visit: Pt. has made excellent progress with bialteral [facility] skills, and grasping small objects. Pt. continues to have difficulty grasping, and manipulating small objects. Eval: Pt. presents with limited [facility] skills with difficulty picking,and manipulating small objects.    Time 12    Period Weeks    Status On-going    Target Date 08/21/21      OT LONG TERM GOAL #6   Title Pt. will improve UE strength by 2 mm grades to assist with ADLs, and IADLS,    Baseline 08QP visit: BUE strength: 4+/5 20th visit: BUE strength;  4+/5 overall. Pt. continues to present with limited BUE stregth. Pt. is engaging in more ADL,a nd IADL tasks at home. Eval: pt. has difficulty performing.    Time 12    Period Weeks    Status On-going      OT LONG TERM GOAL #7   Title Pt. will complete light meal preparation with minA    Baseline 30th visit: Pt. is able to prepare  items for cooking, and make cold light meals, sandwiches. Pt. does not use  the stovetop or oven.  06/16/2021: Pt. is assisting her daughter with cooking chiscken, bacon, slamon, and grits. Pt. is preparing meals with her daughter's assist. Eval: Pt. is unable to prepare light meal preparation.    Time 12    Period Weeks    Status On-going    Target Date 08/21/21      OT LONG TERM GOAL #8   Title Pt. will perform light home management tasks with minA    Baseline 30th visit: pt. continues to participate in more  home management tasks. 06/16/2021: Pt. is independent with bedmaking, vacuuming, dishes, dusting, sweeping the porch. Pt. is independent with her, and her husband's laundry. Pt.'s daughter helps her down the outdoor steps [place] of the stones. Pt. is engaging in more home management tasks. Eval: pt. is unable to perform    Time 12    Period Weeks    Status On-going    Target Date 08/21/21      OT LONG TERM GOAL  #9   TITLE Pt will demonstrate improved visual/spatial skills as demonstrated by ability to adequately space words in a sentence with min vc.    Baseline 30th visit: Pt. continues to require cues, and assist for spacing between words when formulating sentences.  20th visit: Pt. requires cues, and assist for spacing when writing sentences.    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  Patient was  able to verbalize the information needed to fill out each portion of the application.  Patient was able to fill out the sections containing the address phone numbers, dates, initials, and [place] number on the appropriate page. Pt. Required verbal cues for spelling  the month September, as patient consistently omitted letters e-r. Pt. Was able to initiate corrections with cues. Patient education was provided about options for simplifying the form with preprinted business address and [facility] numbers which the pt. reports are on each form due provided to the client.  Patient continues to work on improving accuracy, and efficiency with filling out work related application forms.   OT Occupational Profile and History Problem Focused Assessment - Including review of records relating to presenting problem    Occupational performance deficits (Please refer to evaluation for details): ADL's;IADL's    Body Structure / Function / Physical Skills ADL;IADL;FMC;ROM;Strength    Rehab Potential Good    Clinical Decision Making Several treatment options, min-mod task modification necessary    Comorbidities Affecting Occupational Performance: May have comorbidities impacting occupational performance    Modification or Assistance to Complete Evaluation  Min-Moderate modification of tasks or assist with assess necessary to complete eval    OT Frequency 2x / week    OT Duration 12 weeks    OT Treatment/Interventions Self-care/ADL training;Neuromuscular education;Therapeutic activities;Patient/family education;DME and/or AE instruction;Visual/perceptual remediation/compensation    Consulted and Agree with Plan of Care Patient             Patient will benefit from skilled therapeutic intervention in order to improve the following deficits and impairments:   Body Structure / Function / Physical Skills: ADL, IADL, FMC, ROM, Strength       Visit Diagnosis:  Muscle weakness (generalized)  Other lack of  coordination    Problem List Patient Active Problem List   Diagnosis Date Noted   Multiple lung nodules 03/27/2021   Dysarthria and anarthria 02/11/2021   Sinus tachycardia    Labile blood glucose    Dysphagia    Seizure prophylaxis    Malnutrition of moderate degree 01/23/2021   Anoxic brain injury ([place]) 01/10/2021   Prediabetes    Dyslipidemia    S/P percutaneous endoscopic gastrostomy (PEG) tube placement [facility])    Status post tracheostomy ([place])    Aspiration pneumonia of both lower lobes due to gastric secretions ([place]) 01/05/2021   Acute respiratory failure ([place])    Hypoxic ischemic encephalopathy    Endotracheal tube present    Cardiac arrest ([place]) 12/02/2020   Essential hypertension 12/09/2011    Harrel Carina, MS, OTR/L 08/15/2021, 8:08 PM  [place] MAIN [facility] SERVICES [address] [place], [place], [address] Phone: [phone]   Fax:  [phone]  Name: [name] [name] MRN: [id number] Date of Birth: 17-Nov-1966

## 2021-08-19 ENCOUNTER — Other Ambulatory Visit: Payer: Self-pay

## 2021-08-19 ENCOUNTER — Encounter: Payer: Self-pay | Admitting: Occupational Therapy

## 2021-08-19 ENCOUNTER — Ambulatory Visit: Payer: Self-pay | Admitting: Speech Pathology

## 2021-08-19 ENCOUNTER — Ambulatory Visit: Payer: Self-pay | Admitting: Occupational Therapy

## 2021-08-19 DIAGNOSIS — G931 Anoxic brain damage, not elsewhere classified: Secondary | ICD-10-CM

## 2021-08-19 DIAGNOSIS — R41841 Cognitive communication deficit: Secondary | ICD-10-CM

## 2021-08-19 DIAGNOSIS — R278 Other lack of coordination: Secondary | ICD-10-CM

## 2021-08-19 NOTE — Therapy (Signed)
[place] MAIN [facility] SERVICES [address] [place], [place], [address] Phone: [phone]   Fax:  [phone]  Occupational Therapy Treatment  Patient Details  Name: [name] [name] MRN: [id number] Date of Birth: [birth year] No data recorded  Encounter Date: 08/19/2021   OT End of Session - 08/19/21 1614     Visit Number 37    Number of Visits 48    Date for OT Re-Evaluation 08/21/21    Authorization Type Progress reports period starting 02/11/2021    OT Start Time 1600    OT Stop Time 1645    OT Time Calculation (min) 45 min    Activity Tolerance Patient tolerated treatment well    Behavior During Therapy [facility] for tasks assessed/performed             Past Medical History:  Diagnosis Date   Anoxic brain injury ([place])    Cardiac arrest ([place])    Cognitive communication deficit    Hyperlipidemia    Hypertension    Seizures ([place])    Ventricular fibrillation ([place])     Past Surgical History:  Procedure Laterality Date   PEG PLACEMENT N/A 12/13/2020   Procedure: PERCUTANEOUS ENDOSCOPIC GASTROSTOMY (PEG) PLACEMENT;  Surgeon: Lesly Rubenstein, MD;  Location: [facility] ENDOSCOPY;  Service: Endoscopy;  Laterality: N/A;   TRACHEOSTOMY TUBE PLACEMENT N/A 12/13/2020   Procedure: TRACHEOSTOMY;  Surgeon: Clyde Canterbury, MD;  Location: [facility] ORS;  Service: ENT;  Laterality: N/A;    There were no vitals filed for this visit.   Subjective Assessment - 08/19/21 1613     Subjective  Patient reports being back at the office has been wonderful.    Patient is accompanied by: Family member    Pertinent History Pt. is a 55 y.o. female who went into cardiac arrest, and was admitted to [facility] on 12/02/2020. Pt. was diagnosed with Anoxic Brain Injury following the Cardiac Arrest. When medically stable, the pt. was tranferred to [facility]. The patient was discharged home on 02/08/2021, and is ready to begin outpatient OT services. Pt. was  independent, and working prior to the onset. Pt. Has very supportive family. Pt. Enjoys gardening.    Patient Stated Goals Pt would like to be as independent as possible.    Currently in Pain? No/denies            OT TREATMENT    Therapeutic Exercise:   Pt. worked on the [employer] for 8 min. with constant monitoring of the BUEs. Pt. Worked on changing, and alternating forward reverse position every 2 min. Rest breaks were required.     Therapeutic Activities:   Pt. worked on Location manager and aligning terra pieces according to [employer] placed on the tabletop vertically in front of her. Pt. worked on [facility] skills grasping, and turning the design pieces to fit in the spaces according to a design pattern.    Pt. was able to independently, and efficiently complete the first full design pattern without any cues. No cues were required for the accuracy of the direction of each tetra piece to complete the full rectangular design pattern. Pt. required fewer step-by-step verbal, and visual cues to turn the design pieces in the appropriate direction to fit, and follow the exact design pattern. Pt. Had difficulty completing the design pattern with missing pieces. Pt. continues to work on improving visual perceptual skills, UE strength, [place], and bilateral hand function in order to maximize  independence with ADLs, and IADL tasks.                         OT Education - 08/19/21 1613     Education Details visual perceptual skills, [facility]    Person(s) Educated Patient    Methods Explanation    Comprehension Verbalized understanding;Returned demonstration                 OT Long Term Goals - 07/24/21 1617       OT LONG TERM GOAL #1   Title Pt. will be able to independently sign her name with 100% legibility.    Baseline Pt. continues to present with limited legibility formulating her signature with inconsistent spacing between  her first, and last name. Eval: Signature not legible, however was able to sign on the line with no deviation above, or below the line.  Recert: Signature with 75% legibility. Spacing words on a line is impaired and requires extensive cueing to scan L to R across the paper. 20th visit: 75% legible printed, 50% signature.    Time 12    Period Weeks    Status On-going    Target Date 08/21/21      OT LONG TERM GOAL #2   Title .      OT LONG TERM GOAL #3   Title Pt. will increase right grip strength by 5# to be able hold a full glass    Baseline 07/24/2021:  Pt. is making progress with opening containers., however has difficulty opening soda bottles. Pt. is improving with formulating a composite fist. Eval: limited grip strength. Limited bilateral digit flexion, unable to make a full composite fist. 20th visit: Pt. is making excellent progress with bilateral grip strengt. Pt. is able to hold, a d drink from a full glass.    Time 12    Period Weeks    Status Revised    Target Date 08/21/21      OT LONG TERM GOAL #4   Title Pt. will independently access a computer, and type a one sentence email correspondence.    Baseline 30th visit: Pt. is now starting to send work related emails, that her daughter reviews before sending..20th visit: Pt. continues to have difficulty tying a sentence leggibly. Pt. is able to type a one sentence correspondence with multiple mistypes. Eval: Pt. is unable to to perform    Time 12    Period Weeks    Status On-going    Target Date 08/21/21      OT LONG TERM GOAL #5   Title Pt. will improve bilateral [facility] skills to be able to grasp, and manipulate small objects during ADLs.    Baseline 30th visit: Pt. continues to present with  [facility] limitiations dropping multiple small objects from her hand during translatory movements of the hand. 20th visit: Pt. has made excellent progress with bialteral [facility] skills, and grasping small objects. Pt. continues to have difficulty grasping,  and manipulating small objects. Eval: Pt. presents with limited [facility] skills with difficulty picking,and manipulating small objects.    Time 12    Period Weeks    Status On-going    Target Date 08/21/21      OT LONG TERM GOAL #6   Title Pt. will improve UE strength by 2 mm grades to assist with ADLs, and IADLS,    Baseline 64QI visit: BUE strength: 4+/5 20th visit: BUE strength; 4+/5 overall. Pt. continues to present with limited  BUE stregth. Pt. is engaging in more ADL,a nd IADL tasks at home. Eval: pt. has difficulty performing.    Time 12    Period Weeks    Status On-going      OT LONG TERM GOAL #7   Title Pt. will complete light meal preparation with minA    Baseline 30th visit: Pt. is able to prepare  items for cooking, and make cold light meals, sandwiches. Pt. does not use the stovetop or oven.  06/16/2021: Pt. is assisting her daughter with cooking chiscken, bacon, slamon, and grits. Pt. is preparing meals with her daughter's assist. Eval: Pt. is unable to prepare light meal preparation.    Time 12    Period Weeks    Status On-going    Target Date 08/21/21      OT LONG TERM GOAL #8   Title Pt. will perform light home management tasks with minA    Baseline 30th visit: pt. continues to participate in more  home management tasks. 06/16/2021: Pt. is independent with bedmaking, vacuuming, dishes, dusting, sweeping the porch. Pt. is independent with her, and her husband's laundry. Pt.'s daughter helps her down the outdoor steps beac[place] of the stones. Pt. is engaging in more home management tasks. Eval: pt. is unable to perform    Time 12    Period Weeks    Status On-going    Target Date 08/21/21      OT LONG TERM GOAL  #9   TITLE Pt will demonstrate improved visual/spatial skills as demonstrated by ability to adequately space words in a sentence with min vc.    Baseline 30th visit: Pt. continues to require cues, and assist for spacing between words when formulating sentences.  20th  visit: Pt. requires cues, and assist for spacing when writing sentences.    Time 12    Period Weeks    Status On-going    Target Date 08/21/21                   Plan - 08/19/21 1615     Clinical Impression Statement Pt. was able to independently, and efficiently complete the first full design pattern without any cues. No cues were required for the accuracy of the direction of each tetra piece to complete the full rectangular design pattern. Pt. required fewer step-by-step verbal, and visual cues to turn the design pieces in the appropriate direction to fit, and follow the exact design pattern. Pt. Had difficulty completing the design pattern with missing pieces. Pt. continues to work on improving visual perceptual skills, UE strength, [place], and bilateral hand function in order to maximize independence with ADLs, and IADL tasks.      OT Occupational Profile and History Problem Focused Assessment - Including review of records relating to presenting problem    Occupational performance deficits (Please refer to evaluation for details): ADL's;IADL's    Body Structure / Function / Physical Skills ADL;IADL;FMC;ROM;Strength    Clinical Decision Making Several treatment options, min-mod task modification necessary    Comorbidities Affecting Occupational Performance: May have comorbidities impacting occupational performance    Modification or Assistance to Complete Evaluation  Min-Moderate modification of tasks or assist with assess necessary to complete eval    OT Frequency 2x / week    OT Duration 12 weeks    OT Treatment/Interventions Self-care/ADL training;Neuromuscular education;Therapeutic activities;Patient/family education;DME and/or AE instruction;Visual/perceptual remediation/compensation    Consulted and Agree with Plan of Care Patient  Patient will benefit from skilled therapeutic intervention in order to improve the following deficits and impairments:   Body  Structure / Function / Physical Skills: ADL, IADL, FMC, ROM, Strength       Visit Diagnosis: Other lack of coordination    Problem List Patient Active Problem List   Diagnosis Date Noted   Multiple lung nodules 03/27/2021   Dysarthria and anarthria 02/11/2021   Sinus tachycardia    Labile blood glucose    Dysphagia    Seizure prophylaxis    Malnutrition of moderate degree 01/23/2021   Anoxic brain injury ([place]) 01/10/2021   Prediabetes    Dyslipidemia    S/P percutaneous endoscopic gastrostomy (PEG) tube placement [facility])    Status post tracheostomy ([place])    Aspiration pneumonia of both lower lobes due to gastric secretions ([place]) 01/05/2021   Acute respiratory failure ([facility])    Hypoxic ischemic encephalopathy    Endotracheal tube present    Cardiac arrest ([place]) 12/02/2020   Essential hypertension 12/09/2011    Harrel Carina, MS, OTR/L 08/19/2021, 4:17 PM  [place] MAIN [facility] SERVICES [address] [place], [place], [address] Phone: [phone]   Fax:  [phone]  Name: [name] [name] MRN: [id number] Date of Birth: February 19, 1966

## 2021-08-21 ENCOUNTER — Ambulatory Visit: Payer: Self-pay | Admitting: Speech Pathology

## 2021-08-21 ENCOUNTER — Ambulatory Visit: Payer: Self-pay | Admitting: Occupational Therapy

## 2021-08-21 ENCOUNTER — Encounter: Payer: Self-pay | Admitting: Occupational Therapy

## 2021-08-21 ENCOUNTER — Other Ambulatory Visit: Payer: Self-pay

## 2021-08-21 DIAGNOSIS — R278 Other lack of coordination: Secondary | ICD-10-CM

## 2021-08-21 DIAGNOSIS — G931 Anoxic brain damage, not elsewhere classified: Secondary | ICD-10-CM

## 2021-08-21 DIAGNOSIS — R471 Dysarthria and anarthria: Secondary | ICD-10-CM

## 2021-08-21 DIAGNOSIS — R41841 Cognitive communication deficit: Secondary | ICD-10-CM

## 2021-08-21 DIAGNOSIS — M6281 Muscle weakness (generalized): Secondary | ICD-10-CM

## 2021-08-21 NOTE — Patient Instructions (Signed)
Begin organizing her medicines with supervision from family member

## 2021-08-21 NOTE — Therapy (Signed)
[place] MAIN [facility] SERVICES [address] [place], [place], [address] Phone: [phone]   Fax:  [phone]  Occupational Therapy Treatment  Patient Details  Name: [name] [name] MRN: [id number] Date of Birth: 07/25/1966 No data recorded  Encounter Date: 08/21/2021   OT End of Session - 08/21/21 1604     Visit Number 38    Number of Visits 48    Date for OT Re-Evaluation 11/13/21    Authorization Type Progress reports period starting 02/11/2021    OT Start Time 56    OT Stop Time 1645    OT Time Calculation (min) 45 min    Activity Tolerance Patient tolerated treatment well    Behavior During Therapy [facility] for tasks assessed/performed             Past Medical History:  Diagnosis Date   Anoxic brain injury ([place])    Cardiac arrest ([place])    Cognitive communication deficit    Hyperlipidemia    Hypertension    Seizures ([place])    Ventricular fibrillation ([place])     Past Surgical History:  Procedure Laterality Date   PEG PLACEMENT N/A 12/13/2020   Procedure: PERCUTANEOUS ENDOSCOPIC GASTROSTOMY (PEG) PLACEMENT;  Surgeon: Lesly Rubenstein, MD;  Location: [facility] ENDOSCOPY;  Service: Endoscopy;  Laterality: N/A;   TRACHEOSTOMY TUBE PLACEMENT N/A 12/13/2020   Procedure: TRACHEOSTOMY;  Surgeon: Clyde Canterbury, MD;  Location: [facility] ORS;  Service: ENT;  Laterality: N/A;    There were no vitals filed for this visit.   Subjective Assessment - 08/21/21 1603     Subjective  Patient reports that it feels good to dress in her normal clothes/outfits again.    Patient is accompanied by: Family member    Pertinent History Pt. is a 55 y.o. female who went into cardiac arrest, and was admitted to [facility] on 12/02/2020. Pt. was diagnosed with Anoxic Brain Injury following the Cardiac Arrest. When medically stable, the pt. was tranferred to [place] re[facility]. The patient was discharged home on 02/08/2021, and is ready to begin outpatient OT  services. Pt. was independent, and working prior to the onset. Pt. Has very supportive family. Pt. Enjoys gardening.    Currently in Pain? No/denies                [facility] OT Assessment - 08/21/21 1610       Assessment   Medical Diagnosis Anoxic Brain Injury      Coordination   Right 9 Hole Peg Test 29    Left 9 Hole Peg Test 30      AROM   Overall AROM Comments Right shoulder flexion 150, abduction: 120, Left shoulder flexion 175,abduction 150      Strength   Overall Strength Comments 5/5      Hand Function   Right Hand Grip (lbs) 37#    Right Hand Lateral Pinch 13 lbs    Right Hand 3 Point Pinch 13 lbs    Left Hand Grip (lbs) 42    Left Hand Lateral Pinch 15 lbs    Left 3 point pinch 11 lbs             Measurements were obtained and goals were reviewed with the pt.  Pt. has made excellent  progress overall. Pt. Has made progress with bilateral UE strength, grip strength, pinch strength, and [place] skills. Pt. Has met her goals in these areas. Pt. Is now performing home management tasks, and performing meal preparation  independently, however receives supervision at home for cooking on the stovetop. Pt. Reports that she does not use the oven, but would like to now as he daughter will be going away to school.  Pt. Has returned to work. Pt. Reports she is now able to send short phrases for email correspondence, however would like to be able to formulate longer email messages. Pt. Reports that she is mostly sending text messages now. Pt. Is improving with writing skills however continues to present with inconsistent spacing between words. Pt. Is appropriate for a change of frequency from 2x's a week to 1x a week. Goals were reviewed with the pt. Pt. Would like to focus on writing, typing emails, and cooking skills in order to work towards independence using the stovetop, and oven over the next recertification period.                  OT Education - 08/21/21 1603      Education Details visual perceptual skills, [facility]    Person(s) Educated Patient    Methods Explanation    Comprehension Verbalized understanding;Returned demonstration                 OT Long Term Goals - 08/21/21 1623       OT LONG TERM GOAL #1   Title Pt. will be able to independently sign her name with 100% legibility.    Baseline Pt. continues to present with limited legibility formulating her signature with inconsistent spacing between her first, and last name. Eval: Signature not legible, however was able to sign on the line with no deviation above, or below the line.  Recert: Signature with 75% legibility. Spacing words on a line is impaired and requires extensive cueing to scan L to R across the paper. 20th visit: 75% legible printed, 50% signature. Pt. has improved legibility with printed form, however, occasssionally omits letters in her name.    Time 12    Period Weeks    Status On-going    Target Date 11/13/21      OT LONG TERM GOAL #3   Title Pt. will increase right grip strength by 5# to be able hold a full glass    Baseline 08/21/2021: Goal met. 07/24/2021:  Pt. is making progress with opening containers., however has difficulty opening soda bottles. Pt. is improving with formulating a composite fist. Eval: limited grip strength. Limited bilateral digit flexion, unable to make a full composite fist. 20th visit: Pt. is making excellent progress with bilateral grip strengt. Pt. is able to hold, a d drink from a full glass.    Time 12    Period Weeks    Status Achieved    Target Date 08/21/21      OT LONG TERM GOAL #4   Title Pt. will independently access a computer, and type a one sentence email correspondence.    Baseline 08/21/2021: Pt. is able to type, and send simple phrase emails. 30th visit: Pt. is now starting to send work related emails, that her daughter reviews before sending..20th visit: Pt. continues to have difficulty tying a sentence leggibly. Pt. is able to  type a one sentence correspondence with multiple mistypes. Eval: Pt. is unable to to perform    Time 12    Period Weeks    Status On-going      OT LONG TERM GOAL #5   Title Pt. will improve bilateral [facility] skills to be able to grasp, and manipulate small objects during ADLs.  Baseline 08/21/2021: Goal met. 30th visit: Pt. continues to present with  [facility] limitiations dropping multiple small objects from her hand during translatory movements of the hand. 20th visit: Pt. has made excellent progress with bialteral [facility] skills, and grasping small objects. Pt. continues to have difficulty grasping, and manipulating small objects. Eval: Pt. presents with limited [facility] skills with difficulty picking,and manipulating small objects.    Time 12    Period Weeks    Status On-going      OT LONG TERM GOAL #6   Title Pt. will improve UE strength by 2 mm grades to assist with ADLs, and IADLS,    Baseline 6/70/1410: BUE strength 5/5 overall. Goal met. 30th visit: BUE strength: 4+/5 20th visit: BUE strength; 4+/5 overall. Pt. continues to present with limited BUE stregth. Pt. is engaging in more ADL,a nd IADL tasks at home. Eval: pt. has difficulty performing.    Time 12    Period Weeks    Status Achieved      OT LONG TERM GOAL #7   Title Pt. will complete light meal preparation with minA    Baseline 08/21/2021: Pt. is able to perform cold meal preparation independently. Pt. uses the stovetop with supervion. Pt. does not use the oven. 30th visit: Pt. is able to prepare  items for cooking, and make cold light meals, sandwiches. Pt. does not use the stovetop or oven.  06/16/2021: Pt. is assisting her daughter with cooking chiscken, bacon, slamon, and grits. Pt. is preparing meals with her daughter's assist. Eval: Pt. is unable to prepare light meal preparation.    Time 12    Period Weeks    Status On-going    Target Date 11/13/21      OT LONG TERM GOAL #8   Title Pt. will perform light home management tasks with  minA    Baseline 08/21/2021: Goal met 30th visit: pt. continues to participate in more  home management tasks. 06/16/2021: Pt. is independent with bedmaking, vacuuming, dishes, dusting, sweeping the porch. Pt. is independent with her, and her husband's laundry. Pt.'s daughter helps her down the outdoor steps [place] of the stones. Pt. is engaging in more home management tasks. Eval: pt. is unable to perform    Time 12    Period Weeks    Status On-going      OT LONG TERM GOAL  #9   TITLE Pt will demonstrate improved visual/spatial skills as demonstrated by ability to adequately space words in a sentence with min vc.    Baseline 08/21/2021: Pt. is making progress however continues to ipersent with inconsistent spacing. when writing sentences. 30th visit: Pt. continues to require cues, and assist for spacing between words when formulating sentences.  20th visit: Pt. requires cues, and assist for spacing when writing sentences.    Time 12    Period Weeks    Target Date 11/13/21                   Plan - 08/21/21 1609     Clinical Impression Statement Measurements were obtained and goals were reviewed with the pt.  Pt. has made excellent  progress overall. Pt. Has made progress with bilateral UE strength, grip strength, pinch strength, and [place] skills. Pt. Has met her goals in these areas. Pt. Is now performing home management tasks, and performing meal preparation independently, however receives supervision at home for cooking on the stovetop. Pt. Reports that she does not use the oven, but would like to  now as he daughter will be going away to school.  Pt. Has returned to work. Pt. Reports she is now able to send short phrases for email correspondence, however would like to be able to formulate longer email messages. Pt. Reports that she is mostly sending text messages now. Pt. Is improving with writing skills however continues to present with inconsistent spacing between words. Pt. Is appropriate  for a change of frequency from 2x's a week to 1x a week. Goals were reviewed with the pt. Pt. Would like to focus on writing, typing emails, and cooking skills in order to work towards independence using the stovetop, and oven over the next recertification period.       OT Occupational Profile and History Problem Focused Assessment - Including review of records relating to presenting problem    Occupational performance deficits (Please refer to evaluation for details): ADL's;IADL's    Body Structure / Function / Physical Skills ADL;IADL;FMC;ROM;Strength    Rehab Potential Good    Clinical Decision Making Several treatment options, min-mod task modification necessary    Comorbidities Affecting Occupational Performance: May have comorbidities impacting occupational performance    Modification or Assistance to Complete Evaluation  Min-Moderate modification of tasks or assist with assess necessary to complete eval    OT Frequency 1x / week    OT Duration 12 weeks    OT Treatment/Interventions Self-care/ADL training;Neuromuscular education;Therapeutic activities;Patient/family education;DME and/or AE instruction;Visual/perceptual remediation/compensation    Consulted and Agree with Plan of Care Patient             Patient will benefit from skilled therapeutic intervention in order to improve the following deficits and impairments:   Body Structure / Function / Physical Skills: ADL, IADL, FMC, ROM, Strength       Visit Diagnosis: Muscle weakness (generalized)  Other lack of coordination    Problem List Patient Active Problem List   Diagnosis Date Noted   Multiple lung nodules 03/27/2021   Dysarthria and anarthria 02/11/2021   Sinus tachycardia    Labile blood glucose    Dysphagia    Seizure prophylaxis    Malnutrition of moderate degree 01/23/2021   Anoxic brain injury ([place]) 01/10/2021   Prediabetes    Dyslipidemia    S/P percutaneous endoscopic gastrostomy (PEG) tube  placement ([place])    Status post tracheostomy ([place])    Aspiration pneumonia of both lower lobes due to gastric secretions ([place]) 01/05/2021   Acute respiratory failure ([place])    Hypoxic ischemic encephalopathy    Endotracheal tube present    Cardiac arrest ([place]) 12/02/2020   Essential hypertension 12/09/2011    Harrel Carina, MS, OTR/L 08/21/2021, 6:27 PM  [place] MAIN [facility] SERVICES [address] [place], [place], [address] Phone: [phone]   Fax:  [phone]  Name: CRIMSON DUBBERLY MRN: [id number] Date of Birth: February 20, 1966

## 2021-08-21 NOTE — Therapy (Signed)
[place] MAIN [facility] SERVICES [address] [place], [place], [address] Phone: [phone]   Fax:  [phone]  Speech Language Pathology Treatment  Patient Details  Name: QUINCY PRISCO MRN: [id number] Date of Birth: 1966-06-10 Referring Provider (SLP): Marlowe Shores, [place]   Encounter Date: 08/19/2021   End of Session - 08/20/21 1759     Visit Number 42    Number of Visits 39    Date for SLP Re-Evaluation 08/21/21    Authorization Type Self Pay    Authorization Time Period 05/28/2021 thru 08/21/2021    Authorization - Visit Number 2    Progress Note Due on Visit 10    SLP Start Time 1500    SLP Stop Time  1600    SLP Time Calculation (min) 60 min    Activity Tolerance Patient tolerated treatment well             Past Medical History:  Diagnosis Date   Anoxic brain injury ([place])    Cardiac arrest ([place])    Cognitive communication deficit    Hyperlipidemia    Hypertension    Seizures ([place])    Ventricular fibrillation ([place])     Past Surgical History:  Procedure Laterality Date   PEG PLACEMENT N/A 12/13/2020   Procedure: PERCUTANEOUS ENDOSCOPIC GASTROSTOMY (PEG) PLACEMENT;  Surgeon: Lesly Rubenstein, MD;  Location: [facility] ENDOSCOPY;  Service: Endoscopy;  Laterality: N/A;   TRACHEOSTOMY TUBE PLACEMENT N/A 12/13/2020   Procedure: TRACHEOSTOMY;  Surgeon: Clyde Canterbury, MD;  Location: [facility] ORS;  Service: ENT;  Laterality: N/A;    There were no vitals filed for this visit.   Subjective Assessment - 08/20/21 1718     Subjective "I used great speech to tell someone off"    Currently in Pain? No/denies                     SLP Education - 08/20/21 1759     Education Details provided    Person(s) Educated Patient    Methods Explanation    Comprehension Verbalized understanding;Returned demonstration;Need further instruction              SLP Short Term Goals - 08/20/21 1800       SLP SHORT TERM GOAL #2    Title With minimal cues, pt will utilize speech intelligibility strategies to achieve > 90% intelligibility at the sentence level.              SLP Long Term Goals - 08/09/21 1814       SLP LONG TERM GOAL #2   Title Pt will independently complete functional problem solving for basic and familiar tasks.    Status Partially Met    Target Date 08/21/21      SLP LONG TERM GOAL #3   Title Pt will demonstrate selective attention in a moderately distracting environment for 15 minutes with < 1 redirection to task.    Status Achieved      SLP LONG TERM GOAL #4   Title With minimal cues, pt will increase speech intelligibility to > 75% at the conversation level.    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 Moderate assistance was required to "think thru" these different activities to ensure accuracy and safety. Education provided to pt and her husband on safe ways to increase pt's functional independence at home.    Speech Therapy Frequency 2x / week    Duration 12 weeks    Treatment/Interventions Cognitive reorganization;Compensatory techniques;Internal/external aids;SLP instruction and feedback;Patient/family education;Functional tasks;Compensatory strategies    Potential to Achieve Goals Good    Potential Considerations Severity of impairments;Ability to learn/carryover information    Consulted and Agree with Plan of Care Patient             Patient will benefit from skilled therapeutic intervention in order to improve the following deficits and impairments:   Cognitive communication deficit  Anoxic brain injury [facility])    Problem List Patient Active Problem List   Diagnosis Date Noted   Multiple lung nodules 03/27/2021    Dysarthria and anarthria 02/11/2021   Sinus tachycardia    Labile blood glucose    Dysphagia    Seizure prophylaxis    Malnutrition of moderate degree 01/23/2021   Anoxic brain injury ([place]) 01/10/2021   Prediabetes    Dyslipidemia    S/P percutaneous endoscopic gastrostomy (PEG) tube placement [facility])    Status post tracheostomy ([place])    Aspiration pneumonia of both lower lobes due to gastric secretions ([place]) 01/05/2021   Acute respiratory failure (HCC)    Hypoxic ischemic encephalopathy    Endotracheal tube present    Cardiac arrest ([place]) 12/02/2020   Essential hypertension 12/09/2011   Wilmon Conover B. Rutherford Nail M.S., CCC-SLP, [place] Pathologist Rehabilitation Services Office [phone]  Stormy Fabian 08/21/2021, 8:03 AM  [place] MAIN [facility] SERVICES [address] [place], [place], [address] Phone: [phone]   Fax:  [phone]   Name: GYSELLE MATTHEW MRN: [id number] Date of Birth: Oct 05, 1966

## 2021-08-22 NOTE — Therapy (Signed)
[place] MAIN [facility] SERVICES [address] [place], [place], [address] Phone: [phone]   Fax:  [phone]  Speech Language Pathology Treatment RECERTIFICATION Patient Details  Name: [name] [name] MRN: [id number] Date of Birth: Jan 24, 1966 Referring Provider (SLP): Marlowe Shores, [place]   Encounter Date: 08/21/2021   End of Session - 08/21/21 1517     Visit Number 43    Number of Visits 45    Date for SLP Re-Evaluation 11/13/21    Authorization Type Self Pay    Authorization Time Period 08/21/2021 thru 11/13/2021    Authorization - Visit Number 3    Progress Note Due on Visit 10    SLP Start Time 6010    SLP Stop Time  1600    SLP Time Calculation (min) 55 min    Activity Tolerance Patient tolerated treatment well             Past Medical History:  Diagnosis Date   Anoxic brain injury ([place])    Cardiac arrest ([place])    Cognitive communication deficit    Hyperlipidemia    Hypertension    Seizures ([place])    Ventricular fibrillation ([place])     Past Surgical History:  Procedure Laterality Date   PEG PLACEMENT N/A 12/13/2020   Procedure: PERCUTANEOUS ENDOSCOPIC GASTROSTOMY (PEG) PLACEMENT;  Surgeon: Lesly Rubenstein, MD;  Location: [facility] ENDOSCOPY;  Service: Endoscopy;  Laterality: N/A;   TRACHEOSTOMY TUBE PLACEMENT N/A 12/13/2020   Procedure: TRACHEOSTOMY;  Surgeon: Clyde Canterbury, MD;  Location: [facility] ORS;  Service: ENT;  Laterality: N/A;    There were no vitals filed for this visit.   Subjective Assessment - 08/21/21 1515     Subjective "I went to see my neighbor, I won't go for the longest time, she asked me to prayer with her and I did"    Currently in Pain? No/denies                   ADULT SLP TREATMENT - 08/22/21 0001       Cognitive-Linquistic Treatment   Treatment focused on Cognition;Patient/family/caregiver education    Skilled Treatment COGNITION: medication management with directions per pt's current  prescriptions - BID pill organizer independently without error; completed divided attention task with 76% accuracy problem solving (much improved)              SLP Education - 08/22/21 1430     Education Details ST POC    Person(s) Educated Patient    Methods Explanation    Comprehension Verbalized understanding;Need further instruction              SLP Short Term Goals - 08/21/21 1519       SLP SHORT TERM GOAL #1   Title With min-mod cues, pt will demonstrated divided attention to task with 75% accuracy over 30 minutes.    Baseline goal met, goal upgraded    Time 10    Period --   sessions   Status Revised      SLP SHORT TERM GOAL #2   Title Pt will demonstrate emergent awareness by self-monitoring and self-correcting instances of decrease speech intelligibility to achieve >75% speech intelligibility.    Baseline Goal met, upgraded    Time 10    Period --   sessions   Status Revised      SLP SHORT TERM GOAL #3   Title With minimal cues, pt will recall her list of medicines with 90% accuracy in 8  out of 10 opportunities.    Baseline goal met, goal revised    Time 10    Period Weeks    Status Revised      SLP SHORT TERM GOAL #4   Title With minimal assistance, pt will complete semi-complex tasks with 80% accuracy.    Baseline Goal met, goal updated    Time 10    Period --   weeks   Status On-going              SLP Long Term Goals - 08/21/21 1526       SLP LONG TERM GOAL #2   Title Pt will complete complex problem solving tasks with 80% accuracy over 3 out of 5 sessions given minimal cues.    Baseline goal met in time period, goal updated to reflect progress    Time 12    Status Revised    Target Date 11/13/21      SLP LONG TERM GOAL #3   Title Pt will demonstrate divided attention to task in a moderately distracting environment for 30 minutes.    Baseline goal met in alloted time, goal updated to reflect progress    Time 12    Period Weeks     Status Revised    Target Date 11/13/21      SLP LONG TERM GOAL #4   Title Pt will self-monitor and self-correct moments of decreased speech intelligibility to achieve > 90% intelligibillity at the simple conversation level.    Baseline goal met, goal updated    Time 12    Period Weeks    Status Revised    Target Date 11/13/21      SLP LONG TERM GOAL #5   Title Pt will independently use compensatory memory strategies to recall medical information with 90% accuracy.    Baseline new goal    Time 12    Period Weeks    Status New    Target Date 11/13/21              Plan - 08/22/21 1430     Clinical Impression Statement Pt is always eager to attend ST sessions and she is also eager to use recommended strategies to promote carry over into dailty tasks. As a result, pt has met her STGs and LTGs. She continues to present with mild to moderate cognitive deficits in complex memory, speech intelligibility, attention, complex problem solving, executive function. As a result, will request recrtification for an additional 12 weeks of skilled ST intervention to increase pt's functional independence and decrease caregiver burden.    Speech Therapy Frequency 2x / week    Duration 12 weeks    Treatment/Interventions Cognitive reorganization;Compensatory techniques;Internal/external aids;SLP instruction and feedback;Patient/family education;Functional tasks;Compensatory strategies    Potential to Achieve Goals Good    SLP Home Exercise Plan provided, see instruction section    Consulted and Agree with Plan of Care Patient             Patient will benefit from skilled therapeutic intervention in order to improve the following deficits and impairments:   Cognitive communication deficit  Dysarthria and anarthria  Anoxic brain injury [facility])    Problem List Patient Active Problem List   Diagnosis Date Noted   Multiple lung nodules 03/27/2021   Dysarthria and anarthria 02/11/2021   Sinus  tachycardia    Labile blood glucose    Dysphagia    Seizure prophylaxis    Malnutrition of moderate degree 01/23/2021   Anoxic  brain injury ([place]) 01/10/2021   Prediabetes    Dyslipidemia    S/P percutaneous endoscopic gastrostomy (PEG) tube placement [facility])    Status post tracheostomy ([place])    Aspiration pneumonia of both lower lobes due to gastric secretions ([place]) 01/05/2021   Acute respiratory failure (HCC)    Hypoxic ischemic encephalopathy    Endotracheal tube present    Cardiac arrest ([place]) 12/02/2020   Essential hypertension 12/09/2011   Kalvyn Desa B. Rutherford Nail M.S., CCC-SLP, [place] Pathologist Rehabilitation Services Office [phone]  Stormy Fabian 08/22/2021, 2:39 PM  [place] MAIN [facility] SERVICES [address] [place], [place], [address] Phone: [phone]   Fax:  [phone]   Name: [name] [name] MRN: [id number] Date of Birth: 23-Jan-1966

## 2021-08-26 ENCOUNTER — Ambulatory Visit: Payer: Self-pay | Admitting: Occupational Therapy

## 2021-08-26 ENCOUNTER — Other Ambulatory Visit: Payer: Self-pay

## 2021-08-26 ENCOUNTER — Ambulatory Visit: Payer: Self-pay | Attending: Physician Assistant | Admitting: Speech Pathology

## 2021-08-26 DIAGNOSIS — M6281 Muscle weakness (generalized): Secondary | ICD-10-CM | POA: Insufficient documentation

## 2021-08-26 DIAGNOSIS — R41841 Cognitive communication deficit: Secondary | ICD-10-CM | POA: Insufficient documentation

## 2021-08-26 DIAGNOSIS — G931 Anoxic brain damage, not elsewhere classified: Secondary | ICD-10-CM | POA: Insufficient documentation

## 2021-08-27 NOTE — Therapy (Signed)
[place] MAIN [facility] SERVICES [address] [place], [place], [address] Phone: [phone]   Fax:  [phone]  Speech Language Pathology Treatment  Patient Details  Name: [name] [name] MRN: [id number] Date of Birth: 24-Sep-1966 Referring Provider (SLP): Marlowe Shores, [place]   Encounter Date: 08/26/2021   End of Session - 08/27/21 1334     Visit Number 44    Number of Visits 55    Date for SLP Re-Evaluation 11/13/21    Authorization Type Self Pay    Authorization Time Period 08/21/2021 thru 11/13/2021    Authorization - Visit Number 4    Progress Note Due on Visit 10    SLP Start Time 5009    SLP Stop Time  3818    SLP Time Calculation (min) 60 min    Activity Tolerance Patient tolerated treatment well             Past Medical History:  Diagnosis Date   Anoxic brain injury ([place])    Cardiac arrest ([place])    Cognitive communication deficit    Hyperlipidemia    Hypertension    Seizures ([place])    Ventricular fibrillation ([place])     Past Surgical History:  Procedure Laterality Date   PEG PLACEMENT N/A 12/13/2020   Procedure: PERCUTANEOUS ENDOSCOPIC GASTROSTOMY (PEG) PLACEMENT;  Surgeon: Lesly Rubenstein, MD;  Location: [facility] ENDOSCOPY;  Service: Endoscopy;  Laterality: N/A;   TRACHEOSTOMY TUBE PLACEMENT N/A 12/13/2020   Procedure: TRACHEOSTOMY;  Surgeon: Clyde Canterbury, MD;  Location: [facility] ORS;  Service: ENT;  Laterality: N/A;    There were no vitals filed for this visit.   Subjective Assessment - 08/27/21 1149     Subjective "organizing my medicine went really well"    Currently in Pain? No/denies                   ADULT SLP TREATMENT - 08/27/21 0001       Treatment Provided   Treatment provided Cognitive-Linquistic      Cognitive-Linquistic Treatment   Treatment focused on Cognition;Patient/family/caregiver education    Skilled Treatment COGNITION: 88% accuracy when completing divided attention task, minimal  cues to use scanning strategy when recreating various designs              SLP Education - 08/27/21 1334     Education Details progress noted towards goals in today's session    Person(s) Educated Patient    Methods Explanation    Comprehension Verbalized understanding              SLP Short Term Goals - 08/21/21 1519       SLP SHORT TERM GOAL #1   Title With min-mod cues, pt will demonstrated divided attention to task with 75% accuracy over 30 minutes.    Baseline goal met, goal upgraded    Time 10    Period --   sessions   Status Revised      SLP SHORT TERM GOAL #2   Title Pt will demonstrate emergent awareness by self-monitoring and self-correcting instances of decrease speech intelligibility to achieve >75% speech intelligibility.    Baseline Goal met, upgraded    Time 10    Period --   sessions   Status Revised      SLP SHORT TERM GOAL #3   Title With minimal cues, pt will recall her list of medicines with 90% accuracy in 8 out of 10 opportunities.    Baseline goal met, goal  revised    Time 10    Period Weeks    Status Revised      SLP SHORT TERM GOAL #4   Title With minimal assistance, pt will complete semi-complex tasks with 80% accuracy.    Baseline Goal met, goal updated    Time 10    Period --   weeks   Status On-going              SLP Long Term Goals - 08/21/21 1526       SLP LONG TERM GOAL #2   Title Pt will complete complex problem solving tasks with 80% accuracy over 3 out of 5 sessions given minimal cues.    Baseline goal met in time period, goal updated to reflect progress    Time 12    Status Revised    Target Date 11/13/21      SLP LONG TERM GOAL #3   Title Pt will demonstrate divided attention to task in a moderately distracting environment for 30 minutes.    Baseline goal met in alloted time, goal updated to reflect progress    Time 12    Period Weeks    Status Revised    Target Date 11/13/21      SLP LONG TERM GOAL #4    Title Pt will self-monitor and self-correct moments of decreased speech intelligibility to achieve > 90% intelligibillity at the simple conversation level.    Baseline goal met, goal updated    Time 12    Period Weeks    Status Revised    Target Date 11/13/21      SLP LONG TERM GOAL #5   Title Pt will independently use compensatory memory strategies to recall medical information with 90% accuracy.    Baseline new goal    Time 12    Period Weeks    Status New    Target Date 11/13/21              Plan - 08/27/21 1334     Clinical Impression Statement Pt continues to present with mild cognitive deficits in complex memory, speech intelligibility, attention, complex problem solving, executive function. These deficits continue to limit her ability to work, drive, perform household tasks such as cooking. As a result, skilled ST intervention continues to be required to increase functional independence and reduce caregiver burden.    Speech Therapy Frequency 2x / week    Duration 12 weeks    Treatment/Interventions Cognitive reorganization;Compensatory techniques;Internal/external aids;SLP instruction and feedback;Patient/family education;Functional tasks;Compensatory strategies    Potential to Achieve Goals Good    Potential Considerations Severity of impairments;Ability to learn/carryover information    Consulted and Agree with Plan of Care Patient             Patient will benefit from skilled therapeutic intervention in order to improve the following deficits and impairments:   Cognitive communication deficit  Anoxic brain injury [facility])    Problem List Patient Active Problem List   Diagnosis Date Noted   Multiple lung nodules 03/27/2021   Dysarthria and anarthria 02/11/2021   Sinus tachycardia    Labile blood glucose    Dysphagia    Seizure prophylaxis    Malnutrition of moderate degree 01/23/2021   Anoxic brain injury ([place]) 01/10/2021   Prediabetes    Dyslipidemia     S/P percutaneous endoscopic gastrostomy (PEG) tube placement ([place])    Status post tracheostomy ([place])    Aspiration pneumonia of both lower lobes due to gastric  secretions ([place]) 01/05/2021   Acute respiratory failure (HCC)    Hypoxic ischemic encephalopathy    Endotracheal tube present    Cardiac arrest ([place]) 12/02/2020   Essential hypertension 12/09/2011   Lupe Bonner B. Rutherford Nail M.S., CCC-SLP, [place] Pathologist Rehabilitation Services Office [phone]  Stormy Fabian 08/27/2021, 1:38 PM  [place] MAIN [facility] SERVICES [address] [place], [place], [address] Phone: [phone]   Fax:  [phone]   Name: [name] [name] MRN: [id number] Date of Birth: Aug 24, 1966

## 2021-08-28 ENCOUNTER — Ambulatory Visit: Payer: Self-pay | Admitting: Speech Pathology

## 2021-08-28 ENCOUNTER — Ambulatory Visit: Payer: Self-pay | Admitting: Occupational Therapy

## 2021-08-28 ENCOUNTER — Other Ambulatory Visit: Payer: Self-pay

## 2021-08-28 ENCOUNTER — Encounter: Payer: Self-pay | Admitting: Occupational Therapy

## 2021-08-28 DIAGNOSIS — R41841 Cognitive communication deficit: Secondary | ICD-10-CM

## 2021-08-28 DIAGNOSIS — M6281 Muscle weakness (generalized): Secondary | ICD-10-CM

## 2021-08-28 DIAGNOSIS — G931 Anoxic brain damage, not elsewhere classified: Secondary | ICD-10-CM

## 2021-08-28 NOTE — Therapy (Signed)
[place] MAIN [facility] SERVICES [address] [place], [place], [address] Phone: [phone]   Fax:  [phone]  Occupational Therapy Treatment  Patient Details  Name: [name] [name] MRN: [id number] Date of Birth: Nov 14, 1966 No data recorded  Encounter Date: 08/28/2021   OT End of Session - 08/28/21 1651     Visit Number 39    Number of Visits 48    Date for OT Re-Evaluation 11/13/21    Authorization Type Progress reports period starting 02/11/2021    OT Start Time 1600    OT Stop Time 1645    OT Time Calculation (min) 45 min    Activity Tolerance Patient tolerated treatment well    Behavior During Therapy [facility] for tasks assessed/performed             Past Medical History:  Diagnosis Date   Anoxic brain injury ([place])    Cardiac arrest ([place])    Cognitive communication deficit    Hyperlipidemia    Hypertension    Seizures ([place])    Ventricular fibrillation ([place])     Past Surgical History:  Procedure Laterality Date   PEG PLACEMENT N/A 12/13/2020   Procedure: PERCUTANEOUS ENDOSCOPIC GASTROSTOMY (PEG) PLACEMENT;  Surgeon: Lesly Rubenstein, MD;  Location: [facility] ENDOSCOPY;  Service: Endoscopy;  Laterality: N/A;   TRACHEOSTOMY TUBE PLACEMENT N/A 12/13/2020   Procedure: TRACHEOSTOMY;  Surgeon: Clyde Canterbury, MD;  Location: [facility] ORS;  Service: ENT;  Laterality: N/A;    There were no vitals filed for this visit.   Subjective Assessment - 08/28/21 1711     Subjective  Pt. reports that they use Alexa to detrmine when to take things out of the oven.    Patient is accompanied by: Family member    Pertinent History Pt. is a 55 y.o. female who went into cardiac arrest, and was admitted to [facility] on 12/02/2020. Pt. was diagnosed with Anoxic Brain Injury following the Cardiac Arrest. When medically stable, the pt. was tranferred to [facility]. The patient was discharged home on 02/08/2021, and is ready to begin outpatient OT  services. Pt. was independent, and working prior to the onset. Pt. Has very supportive family. Pt. Enjoys gardening.    Currently in Pain? No/denies             Pt. reports having an appointment scheduled for a driver rehabilitation assessment on Oct. 28th. Pt. education was provided about what to expect in anticipation for it.  Pt. education was provided about visual perceptual skills, and compensatory strategies. Pt. reports having difficulty with reading her Bible. Pt. plans to bring her Bible to the next session to review compensatory strategies. Pt. reports that she would like to improve cursive writing. Pt. Education was provided about activities, and modifications to assist with writing, and typing. Pt. reviewed OT goals, and POC with the pt. Pt. plans to work on the cooking/oven use goal at home with her daughter in in preparation for when her daughter will be leaving for college. Pt. Has made progress overall, and continues to work on improving, and maximizing independence with ADLs, and IADL tasks.                      OT Education - 08/28/21 1650     Education Details goals, POC, hand wrting, typing, visual perceptual skills.    Person(s) Educated Patient    Methods Explanation    Comprehension Verbalized understanding;Returned demonstration  OT Long Term Goals - 08/21/21 1623       OT LONG TERM GOAL #1   Title Pt. will be able to independently sign her name with 100% legibility.    Baseline Pt. continues to present with limited legibility formulating her signature with inconsistent spacing between her first, and last name. Eval: Signature not legible, however was able to sign on the line with no deviation above, or below the line.  Recert: Signature with 75% legibility. Spacing words on a line is impaired and requires extensive cueing to scan L to R across the paper. 20th visit: 75% legible printed, 50% signature. Pt. has improved  legibility with printed form, however, occasssionally omits letters in her name.    Time 12    Period Weeks    Status On-going    Target Date 11/13/21      OT LONG TERM GOAL #3   Title Pt. will increase right grip strength by 5# to be able hold a full glass    Baseline 08/21/2021: Goal met. 07/24/2021:  Pt. is making progress with opening containers., however has difficulty opening soda bottles. Pt. is improving with formulating a composite fist. Eval: limited grip strength. Limited bilateral digit flexion, unable to make a full composite fist. 20th visit: Pt. is making excellent progress with bilateral grip strengt. Pt. is able to hold, a d drink from a full glass.    Time 12    Period Weeks    Status Achieved    Target Date 08/21/21      OT LONG TERM GOAL #4   Title Pt. will independently access a computer, and type a one sentence email correspondence.    Baseline 08/21/2021: Pt. is able to type, and send simple phrase emails. 30th visit: Pt. is now starting to send work related emails, that her daughter reviews before sending..20th visit: Pt. continues to have difficulty tying a sentence leggibly. Pt. is able to type a one sentence correspondence with multiple mistypes. Eval: Pt. is unable to to perform    Time 12    Period Weeks    Status On-going      OT LONG TERM GOAL #5   Title Pt. will improve bilateral [facility] skills to be able to grasp, and manipulate small objects during ADLs.    Baseline 08/21/2021: Goal met. 30th visit: Pt. continues to present with  [facility] limitiations dropping multiple small objects from her hand during translatory movements of the hand. 20th visit: Pt. has made excellent progress with bialteral [facility] skills, and grasping small objects. Pt. continues to have difficulty grasping, and manipulating small objects. Eval: Pt. presents with limited [facility] skills with difficulty picking,and manipulating small objects.    Time 12    Period Weeks    Status On-going      OT LONG TERM  GOAL #6   Title Pt. will improve UE strength by 2 mm grades to assist with ADLs, and IADLS,    Baseline 03/19/622: BUE strength 5/5 overall. Goal met. 30th visit: BUE strength: 4+/5 20th visit: BUE strength; 4+/5 overall. Pt. continues to present with limited BUE stregth. Pt. is engaging in more ADL,a nd IADL tasks at home. Eval: pt. has difficulty performing.    Time 12    Period Weeks    Status Achieved      OT LONG TERM GOAL #7   Title Pt. will complete light meal preparation with minA    Baseline 08/21/2021: Pt. is able to perform cold meal preparation  independently. Pt. uses the stovetop with supervion. Pt. does not use the oven. 30th visit: Pt. is able to prepare  items for cooking, and make cold light meals, sandwiches. Pt. does not use the stovetop or oven.  06/16/2021: Pt. is assisting her daughter with cooking chiscken, bacon, slamon, and grits. Pt. is preparing meals with her daughter's assist. Eval: Pt. is unable to prepare light meal preparation.    Time 12    Period Weeks    Status On-going    Target Date 11/13/21      OT LONG TERM GOAL #8   Title Pt. will perform light home management tasks with minA    Baseline 08/21/2021: Goal met 30th visit: pt. continues to participate in more  home management tasks. 06/16/2021: Pt. is independent with bedmaking, vacuuming, dishes, dusting, sweeping the porch. Pt. is independent with her, and her husband's laundry. Pt.'s daughter helps her down the outdoor steps beacuse of the stones. Pt. is engaging in more home management tasks. Eval: pt. is unable to perform    Time 12    Period Weeks    Status On-going      OT LONG TERM GOAL  #9   TITLE Pt will demonstrate improved visual/spatial skills as demonstrated by ability to adequately space words in a sentence with min vc.    Baseline 08/21/2021: Pt. is making progress however continues to ipersent with inconsistent spacing. when writing sentences. 30th visit: Pt. continues to require cues, and  assist for spacing between words when formulating sentences.  20th visit: Pt. requires cues, and assist for spacing when writing sentences.    Time 12    Period Weeks    Target Date 11/13/21                   Plan - 08/28/21 1651     Clinical Impression Statement Pt. reports having an appointment scheduled for a driver rehabilitation assessment on Oct. 28th. Pt. education was provided about what to expect in anticipation for it.  Pt. education was provided about visual perceptual skills, and compensatory strategies. Pt. reports having difficulty with reading her Bible. Pt. plans to bring her Bible to the next session to review compensatory strategies. Pt. reports that she would like to improve cursive writing. Pt. Education was provided about activities, and modifications to assist with writing, and typing. Pt. reviewed OT goals, and POC with the pt. Pt. plans to work on the cooking/oven use goal at home with her daughter in in preparation for when her daughter will be leaving for college. Pt. Has made progress overall, and continues to work on improving, and maximizing independence with ADLs, and IADL tasks.         OT Occupational Profile and History Problem Focused Assessment - Including review of records relating to presenting problem    Occupational performance deficits (Please refer to evaluation for details): ADL's;IADL's    Body Structure / Function / Physical Skills ADL;IADL;FMC;ROM;Strength    Rehab Potential Good    Clinical Decision Making Several treatment options, min-mod task modification necessary    Comorbidities Affecting Occupational Performance: May have comorbidities impacting occupational performance    Modification or Assistance to Complete Evaluation  Min-Moderate modification of tasks or assist with assess necessary to complete eval    OT Frequency 1x / week    OT Duration 12 weeks    OT Treatment/Interventions Self-care/ADL training;Neuromuscular  education;Therapeutic activities;Patient/family education;DME and/or AE instruction;Visual/perceptual remediation/compensation    Consulted and Agree with Plan  of Care Patient             Patient will benefit from skilled therapeutic intervention in order to improve the following deficits and impairments:   Body Structure / Function / Physical Skills: ADL, IADL, FMC, ROM, Strength       Visit Diagnosis: Muscle weakness (generalized)  Cognitive communication deficit    Problem List Patient Active Problem List   Diagnosis Date Noted   Multiple lung nodules 03/27/2021   Dysarthria and anarthria 02/11/2021   Sinus tachycardia    Labile blood glucose    Dysphagia    Seizure prophylaxis    Malnutrition of moderate degree 01/23/2021   Anoxic brain injury ([place]) 01/10/2021   Prediabetes    Dyslipidemia    S/P percutaneous endoscopic gastrostomy (PEG) tube placement [facility])    Status post tracheostomy ([place])    Aspiration pneumonia of both lower lobes due to gastric secretions ([place]) 01/05/2021   Acute respiratory failure ([place])    Hypoxic ischemic encephalopathy    Endotracheal tube present    Cardiac arrest ([place]) 12/02/2020   Essential hypertension 12/09/2011    Harrel Carina, MS,OTR/L 08/28/2021, 5:14 PM  [place] MAIN [facility] SERVICES [address] [place], [place], [address] Phone: [phone]   Fax:  [phone]  Name: STEPHENE ALEGRIA MRN: [id number] Date of Birth: 1966/02/06

## 2021-08-30 NOTE — Therapy (Signed)
[place] MAIN [facility] SERVICES [address] [place], [place], [address] Phone: [phone]   Fax:  [phone]  Speech Language Pathology Treatment  Patient Details  Name: [name] [name] MRN: [id number] Date of Birth: 07-08-1966 Referring Provider (SLP): [place], [place]   Encounter Date: 08/28/2021   End of Session - 08/30/21 1239     Visit Number 45    Number of Visits 5    Date for SLP Re-Evaluation 11/13/21    Authorization Type Self Pay    Authorization Time Period 08/21/2021 thru 11/13/2021    Authorization - Visit Number 5    Progress Note Due on Visit 10    SLP Start Time 1500    SLP Stop Time  1600    SLP Time Calculation (min) 60 min    Activity Tolerance Patient tolerated treatment well             Past Medical History:  Diagnosis Date   Anoxic brain injury ([place])    Cardiac arrest ([place])    Cognitive communication deficit    Hyperlipidemia    Hypertension    Seizures ([place])    Ventricular fibrillation ([place])     Past Surgical History:  Procedure Laterality Date   PEG PLACEMENT N/A 12/13/2020   Procedure: PERCUTANEOUS ENDOSCOPIC GASTROSTOMY (PEG) PLACEMENT;  Surgeon: Lesly Rubenstein, MD;  Location: [facility] ENDOSCOPY;  Service: Endoscopy;  Laterality: N/A;   TRACHEOSTOMY TUBE PLACEMENT N/A 12/13/2020   Procedure: TRACHEOSTOMY;  Surgeon: Clyde Canterbury, MD;  Location: [facility] ORS;  Service: ENT;  Laterality: N/A;    There were no vitals filed for this visit.   Subjective Assessment - 08/30/21 1237     Subjective "I have some questions"    Currently in Pain? No/denies                   ADULT SLP TREATMENT - 08/30/21 0001       Treatment Provided   Treatment provided Cognitive-Linquistic      Cognitive-Linquistic Treatment   Treatment focused on Cognition;Patient/family/caregiver education    Skilled Treatment COGNITION: moderate assistance provided to organize thoughts into functional questions;  novel semi-complex card game introduced, pt able to start problem solving after initial teaching              SLP Education - 08/30/21 1239     Education Details formulating thoughts and writing them down is a great compensatory memory strategy    Person(s) Educated Patient    Methods Explanation;Demonstration;Handout    Comprehension Verbalized understanding;Need further instruction              SLP Short Term Goals - 08/21/21 1519       SLP SHORT TERM GOAL #1   Title With min-mod cues, pt will demonstrated divided attention to task with 75% accuracy over 30 minutes.    Baseline goal met, goal upgraded    Time 10    Period --   sessions   Status Revised      SLP SHORT TERM GOAL #2   Title Pt will demonstrate emergent awareness by self-monitoring and self-correcting instances of decrease speech intelligibility to achieve >75% speech intelligibility.    Baseline Goal met, upgraded    Time 10    Period --   sessions   Status Revised      SLP SHORT TERM GOAL #3   Title With minimal cues, pt will recall her list of medicines with 90% accuracy in 8  out of 10 opportunities.    Baseline goal met, goal revised    Time 10    Period Weeks    Status Revised      SLP SHORT TERM GOAL #4   Title With minimal assistance, pt will complete semi-complex tasks with 80% accuracy.    Baseline Goal met, goal updated    Time 10    Period --   weeks   Status On-going              SLP Long Term Goals - 08/21/21 1526       SLP LONG TERM GOAL #2   Title Pt will complete complex problem solving tasks with 80% accuracy over 3 out of 5 sessions given minimal cues.    Baseline goal met in time period, goal updated to reflect progress    Time 12    Status Revised    Target Date 11/13/21      SLP LONG TERM GOAL #3   Title Pt will demonstrate divided attention to task in a moderately distracting environment for 30 minutes.    Baseline goal met in alloted time, goal updated to  reflect progress    Time 12    Period Weeks    Status Revised    Target Date 11/13/21      SLP LONG TERM GOAL #4   Title Pt will self-monitor and self-correct moments of decreased speech intelligibility to achieve > 90% intelligibillity at the simple conversation level.    Baseline goal met, goal updated    Time 12    Period Weeks    Status Revised    Target Date 11/13/21      SLP LONG TERM GOAL #5   Title Pt will independently use compensatory memory strategies to recall medical information with 90% accuracy.    Baseline new goal    Time 12    Period Weeks    Status New    Target Date 11/13/21              Plan - 08/30/21 1240     Clinical Impression Statement Pt continues to present with mild cognitive deficits in complex memory, speech intelligibility, attention, complex problem solving, executive function. These deficits continue to limit her ability to work, drive, perform household tasks such as cooking. As a result, skilled ST intervention continues to be required to increase functional independence and reduce caregiver burden.    Speech Therapy Frequency 2x / week    Duration 12 weeks    Treatment/Interventions Cognitive reorganization;Compensatory techniques;Internal/external aids;SLP instruction and feedback;Patient/family education;Functional tasks;Compensatory strategies    Potential to Achieve Goals Good    SLP Home Exercise Plan provided, see instruction section    Consulted and Agree with Plan of Care Patient             Patient will benefit from skilled therapeutic intervention in order to improve the following deficits and impairments:   Cognitive communication deficit  Anoxic brain injury [facility])    Problem List Patient Active Problem List   Diagnosis Date Noted   Multiple lung nodules 03/27/2021   Dysarthria and anarthria 02/11/2021   Sinus tachycardia    Labile blood glucose    Dysphagia    Seizure prophylaxis    Malnutrition of moderate  degree 01/23/2021   Anoxic brain injury ([place]) 01/10/2021   Prediabetes    Dyslipidemia    S/P percutaneous endoscopic gastrostomy (PEG) tube placement ([place])    Status post tracheostomy ([place])  Aspiration pneumonia of both lower lobes due to gastric secretions ([place]) 01/05/2021   Acute respiratory failure ([facility])    Hypoxic ischemic encephalopathy    Endotracheal tube present    Cardiac arrest ([place]) 12/02/2020   Essential hypertension 12/09/2011   Deontre Allsup B. [place] Nail M.S., CCC-SLP, [place] Pathologist Rehabilitation Services Office [phone]  Stormy Fabian 08/30/2021, 12:40 PM  [place] MAIN [facility] SERVICES [address] [place], [place], [address] Phone: [phone]   Fax:  [phone]   Name: [name] [name] MRN: [id number] Date of Birth: 1966-08-07

## 2021-09-02 ENCOUNTER — Other Ambulatory Visit: Payer: Self-pay

## 2021-09-02 ENCOUNTER — Ambulatory Visit: Payer: Self-pay | Admitting: Speech Pathology

## 2021-09-02 ENCOUNTER — Ambulatory Visit: Payer: Self-pay | Admitting: Occupational Therapy

## 2021-09-02 DIAGNOSIS — G931 Anoxic brain damage, not elsewhere classified: Secondary | ICD-10-CM

## 2021-09-02 DIAGNOSIS — R41841 Cognitive communication deficit: Secondary | ICD-10-CM

## 2021-09-04 ENCOUNTER — Ambulatory Visit: Payer: Self-pay | Admitting: Speech Pathology

## 2021-09-04 ENCOUNTER — Ambulatory Visit: Payer: Self-pay | Admitting: Occupational Therapy

## 2021-09-04 ENCOUNTER — Encounter: Payer: Self-pay | Admitting: Occupational Therapy

## 2021-09-04 NOTE — Therapy (Signed)
[place] MAIN [facility] SERVICES [address] [place], [place], [address] Phone: [phone]   Fax:  [phone]  September 04, 2021    No Recipients  Occupational Therapy Discharge Summary   Patient: [name] [name] MRN: [id number] Date of Birth: Aug 04, 1966  Diagnosis: No diagnosis found.  No data recorded  The above patient had been seen in Occupational Therapy for 39 scheduled treatments.   The treatment consisted of  ADL training, UE there. Ex., Neuromuscular re-education, and pt./caregiver education about visual perceptual compensatory strategies, and cognitive compensatory strategies.   The patient is: Improved  Subjective:   Pt. has returned to working in the office, and has been scheduled for a driving assessment through [employer] at the end of October.   Functional Status at Discharge:    OT Long Term Goals - 08/21/21 1623       OT LONG TERM GOAL #1   Title Pt. will be able to independently sign her name with 100% legibility.    Baseline Pt. continues to present with limited legibility formulating her signature with inconsistent spacing between her first, and last name. Eval: Signature not legible, however was able to sign on the line with no deviation above, or below the line.  Recert: Signature with 75% legibility. Spacing words on a line is impaired and requires extensive cueing to scan L to R across the paper. 20th visit: 75% legible printed, 50% signature. Pt. has improved legibility with printed form, however, occasssionally omits letters in her name.    Time 12    Period Weeks    Status   Not Met. Improved.   Target Date 11/13/21      OT LONG TERM GOAL #3   Title Pt. will increase right grip strength by 5# to be able hold a full glass    Baseline 08/21/2021: Goal met. 07/24/2021:  Pt. is making progress with opening containers., however has difficulty opening soda bottles. Pt. is improving with  formulating a composite fist. Eval: limited grip strength. Limited bilateral digit flexion, unable to make a full composite fist. 20th visit: Pt. is making excellent progress with bilateral grip strengt. Pt. is able to hold, a d drink from a full glass.    Time 12    Period Weeks    Status Achieved    Target Date 08/21/21      OT LONG TERM GOAL #4   Title Pt. will independently access a computer, and type a one sentence email correspondence.    Baseline 08/21/2021: Pt. is able to type, and send simple phrase emails. 30th visit: Pt. is now starting to send work related emails, that her daughter reviews before sending..20th visit: Pt. continues to have difficulty typing a sentence leggibly. Pt. is able to type a one sentence correspondence with multiple mistypes. Eval: Pt. is unable to to perform    Time 12    Period Weeks    Status Partially met     OT LONG TERM GOAL #5   Title Pt. will improve bilateral [facility] skills to be able to grasp, and manipulate small objects during ADLs.    Baseline 08/21/2021: Goal met. 30th visit: Pt. continues to present with  [facility] limitiations dropping multiple small objects from her hand during translatory movements of the hand. 20th visit: Pt. has made excellent progress with bialteral [facility] skills, and grasping small objects. Pt. continues to have difficulty grasping, and manipulating small objects. Eval: Pt. presents with limited [facility] skills  with difficulty picking,and manipulating small objects.    Time 12    Period Weeks    Status Partially met.     OT LONG TERM GOAL #6   Title Pt. will improve UE strength by 2 mm grades to assist with ADLs, and IADLS,    Baseline 03/01/1447: BUE strength 5/5 overall. Goal met. 30th visit: BUE strength: 4+/5 20th visit: BUE strength; 4+/5 overall. Pt. continues to present with limited BUE stregth. Pt. is engaging in more ADL,a nd IADL tasks at home. Eval: pt. has difficulty performing.    Time 12    Period Weeks    Status Achieved       OT LONG TERM GOAL #7   Title Pt. will complete light meal preparation with minA    Baseline 08/21/2021: Pt. is able to perform cold meal preparation independently. Pt. uses the stovetop with supervion. Pt. does not use the oven. 30th visit: Pt. is able to prepare  items for cooking, and make cold light meals, sandwiches. Pt. does not use the stovetop or oven.  06/16/2021: Pt. is assisting her daughter with cooking chiscken, bacon, slamon, and grits. Pt. is preparing meals with her daughter's assist. Eval: Pt. is unable to prepare light meal preparation.    Time 12    Period Weeks    Status Partially met.    Target Date 11/13/21      OT LONG TERM GOAL #8   Title Pt. will perform light home management tasks with minA    Baseline 08/21/2021: Goal met 30th visit: pt. continues to participate in more  home management tasks. 06/16/2021: Pt. is independent with bedmaking, vacuuming, dishes, dusting, sweeping the porch. Pt. is independent with her, and her husband's laundry. Pt.'s daughter helps her down the outdoor steps beacuse of the stones. Pt. is engaging in more home management tasks. Eval: pt. is unable to perform    Time 12    Period Weeks    Status Goal met     OT LONG TERM GOAL  #9   TITLE Pt will demonstrate improved visual/spatial skills as demonstrated by ability to adequately space words in a sentence with min vc.    Baseline 08/21/2021: Pt. is making progress however continues to present with inconsistent spacing. when writing sentences. 30th visit: Pt. continues to require cues, and assist for spacing between words when formulating sentences.  20th visit: Pt. requires cues, and assist for spacing when writing sentences.    Time 12    Period Weeks    Target Date 11/13/21        Status                                                        Not Met              Goals Partially Met    Sincerely,  Harrel Carina, MS, OTR/L  CC No Recipients  [place] MAIN [facility] SERVICES [address] [place], [place], [address] Phone: [phone]   Fax:  [phone]  Patient: [name] [name] MRN: [id number] Date of Birth: [birth year]

## 2021-09-04 NOTE — Therapy (Signed)
[place] MAIN [facility] SERVICES [address] [place], [place], [address] Phone: [phone]   Fax:  [phone]  Speech Language Pathology Treatment DISCHARGE SUMMARY   Patient Details  Name: [name] [name] MRN: [id number] Date of Birth: Jul 14, 1966 Referring Provider (SLP): Mar[place]e [place], [place]   Encounter Date: 09/02/2021   End of Session - 09/04/21 0707     Visit Number 46    Number of Visits 21    Date for SLP Re-Evaluation 11/13/21    Authorization Type Self Pay    Authorization Time Period 08/21/2021 thru 11/13/2021    Authorization - Visit Number 6    Progress Note Due on Visit 10    SLP Start Time 1500    SLP Stop Time  1600    SLP Time Calculation (min) 60 min    Activity Tolerance Patient tolerated treatment well             Past Medical History:  Diagnosis Date   Anoxic brain injury ([place])    Cardiac arrest ([place])    Cognitive communication deficit    Hyperlipidemia    Hypertension    Seizures ([place])    Ventricular fibrillation ([place])     Past Surgical History:  Procedure Laterality Date   PEG PLACEMENT N/A 12/13/2020   Procedure: PERCUTANEOUS ENDOSCOPIC GASTROSTOMY (PEG) PLACEMENT;  Surgeon: Lesly Rubenstein, MD;  Location: [facility] ENDOSCOPY;  Service: Endoscopy;  Laterality: N/A;   TRACHEOSTOMY TUBE PLACEMENT N/A 12/13/2020   Procedure: TRACHEOSTOMY;  Surgeon: Clyde Canterbury, MD;  Location: [facility] ORS;  Service: ENT;  Laterality: N/A;    There were no vitals filed for this visit.   Subjective Assessment - 09/04/21 0703     Subjective "I want to talk to you about something"    Currently in Pain? No/denies                   ADULT SLP TREATMENT - 09/04/21 0001       Treatment Provided   Treatment provided Cognitive-Linquistic      Cognitive-Linquistic Treatment   Treatment focused on Cognition;Patient/family/caregiver education    Skilled Treatment Skilled treatment session focused on investigating  possible ways to her emails read aloud to her to compensate for visual deficits that prevent her from reading. Pt took notes so that she could further investigate using her desktop at home. She is currently using voice to text feature to compose emails. She is currently independent with medication management, preparing simple meals, communicating her wants/needs/opinions in a truthful confident manner, she demonstrates appropriate insight into deficits.              SLP Education - 09/04/21 0706     Education Details completed    Person(s) Educated Patient    Methods Explanation;Demonstration;Verbal cues;Handout    Comprehension Verbalized understanding              SLP Short Term Goals - 09/04/21 3845       SLP SHORT TERM GOAL #1   Title With min-mod cues, pt will demonstrated divided attention to task with 75% accuracy over 30 minutes.    Status Achieved      SLP SHORT TERM GOAL #2   Title Pt will demonstrate emergent awareness by self-monitoring and self-correcting instances of decrease speech intelligibility to achieve >75% speech intelligibility.    Status Achieved      SLP SHORT TERM GOAL #3   Title With minimal cues, pt will recall her list of  medicines with 90% accuracy in 8 out of 10 opportunities.    Status Achieved      SLP SHORT TERM GOAL #4   Status Deferred              SLP Long Term Goals - 09/04/21 3159       SLP LONG TERM GOAL #2   Title Pt will complete complex problem solving tasks with 80% accuracy over 3 out of 5 sessions given minimal cues.    Status Deferred      SLP LONG TERM GOAL #3   Title Pt will demonstrate divided attention to task in a moderately distracting environment for 30 minutes.    Status Deferred      SLP LONG TERM GOAL #4   Title Pt will self-monitor and self-correct moments of decreased speech intelligibility to achieve > 90% intelligibillity at the simple conversation level.    Status Achieved      SLP LONG TERM GOAL  #5   Title Pt will independently use compensatory memory strategies to recall medical information with 90% accuracy.    Status Achieved              Plan - 09/04/21 0707     Clinical Impression Statement Pt requested discharge from skilled ST services. She feels she is at a new functional baseline. While pt is moderately functional when performing basic tasks, she continues to exhibit mild to moderate cognitive deficits c/b deficits in speech intelligibility at the conversation level, divided attention, memory, reasoning and complex problem solving. Pt's desire to participate in activities of daily living has helped her make progress in very functional ways. Education provided that continue to engage in these activities will hopefully foster more progress. At this time, pt is likely at new functional baseline but she is learning to cope and compensate with moderate cognitive impairments.    Potential Considerations Severity of impairments;Ability to learn/carryover information    Consulted and Agree with Plan of Care Patient             Patient will benefit from skilled therapeutic intervention in order to improve the following deficits and impairments:   Cognitive communication deficit  Anoxic brain injury [facility])    Problem List Patient Active Problem List   Diagnosis Date Noted   Multiple lung nodules 03/27/2021   Dysarthria and anarthria 02/11/2021   Sinus tachycardia    Labile blood glucose    Dysphagia    Seizure prophylaxis    Malnutrition of moderate degree 01/23/2021   Anoxic brain injury ([place]) 01/10/2021   Prediabetes    Dyslipidemia    S/P percutaneous endoscopic gastrostomy (PEG) tube placement [facility])    Status post tracheostomy ([place])    Aspiration pneumonia of both lower lobes due to gastric secretions ([place]) 01/05/2021   Acute respiratory failure (HCC)    Hypoxic ischemic encephalopathy    Endotracheal tube present    Cardiac arrest ([place]) 12/02/2020    Essential hypertension 12/09/2011   Mizuki Hoel B. Rutherford Nail M.S., CCC-SLP, [place] Pathologist Rehabilitation Services Office [phone]  Stormy Fabian 09/04/2021, [place] MAIN [facility] SERVICES [address] [place], [place], [address] Phone: [phone]   Fax:  [phone]   Name: [name] [name] MRN: [id number] Date of Birth: 02-Oct-1966

## 2021-09-09 ENCOUNTER — Ambulatory Visit: Payer: Self-pay | Admitting: Speech Pathology

## 2021-09-09 ENCOUNTER — Ambulatory Visit: Payer: Self-pay | Admitting: Occupational Therapy

## 2021-09-10 ENCOUNTER — Encounter: Payer: Self-pay | Admitting: Physical Medicine & Rehabilitation

## 2021-09-10 ENCOUNTER — Other Ambulatory Visit: Payer: Self-pay

## 2021-09-10 ENCOUNTER — Encounter: Payer: Self-pay | Attending: Physical Medicine & Rehabilitation | Admitting: Physical Medicine & Rehabilitation

## 2021-09-10 VITALS — BP 160/90 | HR 79 | Temp 98.2°F | Ht 64.0 in | Wt 198.0 lb

## 2021-09-10 DIAGNOSIS — G931 Anoxic brain damage, not elsewhere classified: Secondary | ICD-10-CM

## 2021-09-10 NOTE — Patient Instructions (Signed)
KEEP WORKING BUT DON'T OVER-DO IT. IF YOU FEEL LIKE SOMETHING IS OVERWHELMING, THEN BACK OFF FOR A BIT.

## 2021-09-10 NOTE — Progress Notes (Signed)
Subjective:    Patient ID: [name] [name], female    DOB: 1966/08/09, 55 y.o.   MRN: [id number]  HPI [name] [name] is here in follow up of her anoxic brain injury. She completed therapy last week. She still has problem with reading and writing but is improving. She also reports intermittent problems with memory/recall. 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 She used to do much more including taxes and training. 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  She is sleeping well, 8 hours per night. She usually wakes up at 3am to meditate and then sleeps to 9am.  Balance is good. She is eating and swallowing without issues.     Pain Inventory Average Pain 0 Pain Right Now 0 My pain is  no pain  LOCATION OF PAIN  no pain  BOWEL Number of stools per week: 14 Oral laxative use No  Type of laxative na Enema or suppository use No  History of colostomy No  Incontinent No   BLADDER Normal In and out cath, frequency na Able to self cath  na Bladder incontinence No  Frequent urination No  Leakage with coughing No  Difficulty starting stream No  Incomplete bladder emptying No    Mobility ability to climb steps?  yes do you drive?  yes  Function not employed: date last employed 11/29/20 I need assistance with the following:  dressing, meal prep, household duties, and shopping  Neuro/Psych No problems in this area  Prior Studies Any changes since last visit?  no  Physicians involved in your care Any changes since last visit?  no   Family History  Problem Relation Age of Onset   Hypertension Mother    Leukemia Mother    Diabetes Mother    Healthy Father    Social History   Socioeconomic History   Marital status: Married    Spouse name: Not on file   Number of children: Not on file   Years of education: Not on file   Highest education level: Not on file  Occupational History   Not on file  Tobacco Use    Smoking status: Former    Types: Cigarettes    Quit date: 2007    Years since quitting: 15.8   Smokeless tobacco: Never  Vaping Use   Vaping Use: Never used  Substance and Sexual Activity   Alcohol use: No   Drug use: Never   Sexual activity: Not on file  Other Topics Concern   Not on file  Social History Narrative   QUIT smoke [2001]; no alcohol; lives in [place] with husband; 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    Social Determinants of Health   Financial Resource Strain: Not on file  Food Insecurity: Not on file  Transportation Needs: Not on file  Physical Activity: Not on file  Stress: Not on file  Social Connections: Not on file   Past Surgical History:  Procedure Laterality Date   PEG PLACEMENT N/A 12/13/2020   Procedure: PERCUTANEOUS ENDOSCOPIC GASTROSTOMY (PEG) PLACEMENT;  Surgeon: Lesly Rubenstein, MD;  Location: [facility] ENDOSCOPY;  Service: Endoscopy;  Laterality: N/A;   TRACHEOSTOMY TUBE PLACEMENT N/A 12/13/2020   Procedure: TRACHEOSTOMY;  Surgeon: Clyde Canterbury, MD;  Location: [facility] ORS;  Service: ENT;  Laterality: N/A;   Past Medical History:  Diagnosis Date   Anoxic brain injury ([place])    Cardiac arrest [facility])    Cognitive communication deficit    Hyperlipidemia  Hypertension    Seizures (HCC)    Ventricular fibrillation (HCC)    BP (!) 160/90   Pulse 79   Temp 98.2 F (36.8 C) (Oral)   Ht 5\' 4"  (1.626 m)   Wt 198 lb (89.8 kg)   SpO2 96%   BMI 33.99 kg/m   Opioid Risk Score:   Fall Risk Score:  `1  Depression screen PHQ 2/9  Depression screen PHQ 2/9 02/21/2021  Decreased Interest 0  Down, Depressed, Hopeless 0  PHQ - 2 Score 0  Altered sleeping 0  Tired, decreased energy 0  Change in appetite 0  Feeling bad or failure about yourself  0  Trouble concentrating 0  Moving slowly or fidgety/restless 0  Suicidal thoughts 0  PHQ-9 Score 0    Review of Systems  All other systems reviewed and are negative.     Objective:   Physical  Exam  Constitutional: No distress . Vital signs reviewed. HEENT: NCAT, EOMI, oral membranes moist Neck: supple Cardiovascular: RRR without murmur. No JVD    Respiratory/Chest: CTA Bilaterally without wheezes or rales. Normal effort    GI/Abdomen: BS +, non-tender, non-distended Ext: no clubbing, cyanosis, or edema Psych: pleasant and cooperative  Skin: intact Neuro: alert and oriented to person, place, date. Marland Kitchen Speech sl dysarthric but intelligible.     Strength 5/5. Balance nearly normal. Negative romberg.  Musculoskeletal: Full ROM, No pain with AROM or PROM in the neck, trunk, or extremities. Posture appropriate          Assessment & Plan:   1.  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 fib arrest/acute respiratory failure             -HEP  -CONTINUE with immersion work on job, with husband 2.  Pain Management:              -tylenol prn 3. Mood:improved.              -supportive family! 4. Neuropsych:  Taper was provided 5. Dysphagia resolved                             6.  Hypertension.  Continue Norvasc 10 mg daily, Lopressor 25 mg twice daily.     Fifteen minutes of face to face patient care time were spent during this visit. All questions were encouraged and answered.  Follow up with me prn .

## 2021-09-11 ENCOUNTER — Ambulatory Visit: Payer: Self-pay | Admitting: Speech Pathology

## 2021-09-11 ENCOUNTER — Ambulatory Visit: Payer: Self-pay | Admitting: Occupational Therapy

## 2021-09-16 ENCOUNTER — Ambulatory Visit: Payer: Self-pay | Admitting: Occupational Therapy

## 2021-09-16 ENCOUNTER — Ambulatory Visit: Payer: Self-pay | Admitting: Speech Pathology

## 2021-09-18 ENCOUNTER — Ambulatory Visit: Payer: Self-pay | Admitting: Occupational Therapy

## 2021-09-18 ENCOUNTER — Ambulatory Visit: Payer: Self-pay | Admitting: Speech Pathology

## 2021-09-23 ENCOUNTER — Ambulatory Visit: Payer: Self-pay | Admitting: Occupational Therapy

## 2021-09-23 ENCOUNTER — Ambulatory Visit: Payer: Self-pay | Admitting: Speech Pathology

## 2021-09-25 ENCOUNTER — Ambulatory Visit: Payer: Self-pay | Admitting: Occupational Therapy

## 2021-09-25 ENCOUNTER — Ambulatory Visit: Payer: Self-pay | Admitting: Speech Pathology

## 2021-09-30 ENCOUNTER — Ambulatory Visit: Payer: Self-pay | Admitting: Occupational Therapy

## 2021-09-30 ENCOUNTER — Ambulatory Visit: Payer: Self-pay | Admitting: Speech Pathology

## 2021-10-02 ENCOUNTER — Ambulatory Visit: Payer: Self-pay | Admitting: Speech Pathology

## 2021-10-02 ENCOUNTER — Ambulatory Visit: Payer: Self-pay | Admitting: Occupational Therapy

## 2021-10-07 ENCOUNTER — Ambulatory Visit: Payer: Self-pay | Admitting: Speech Pathology

## 2021-10-07 ENCOUNTER — Ambulatory Visit: Payer: Self-pay | Admitting: Occupational Therapy

## 2021-10-09 ENCOUNTER — Ambulatory Visit: Payer: Self-pay | Admitting: Speech Pathology

## 2021-10-09 ENCOUNTER — Ambulatory Visit: Payer: Self-pay | Admitting: Occupational Therapy

## 2021-10-14 ENCOUNTER — Ambulatory Visit: Payer: Self-pay | Admitting: Speech Pathology

## 2021-10-14 ENCOUNTER — Ambulatory Visit: Payer: Self-pay | Admitting: Occupational Therapy

## 2021-10-21 ENCOUNTER — Ambulatory Visit: Payer: Self-pay | Admitting: Occupational Therapy

## 2021-10-21 ENCOUNTER — Ambulatory Visit: Payer: Self-pay | Admitting: Speech Pathology

## 2021-10-23 ENCOUNTER — Ambulatory Visit: Payer: Self-pay | Admitting: Occupational Therapy

## 2021-10-23 ENCOUNTER — Ambulatory Visit: Payer: Self-pay | Admitting: Speech Pathology

## 2021-10-27 ENCOUNTER — Ambulatory Visit: Admission: RE | Admit: 2021-10-27 | Payer: Self-pay | Source: Ambulatory Visit

## 2021-10-28 ENCOUNTER — Ambulatory Visit: Payer: Self-pay | Admitting: Speech Pathology

## 2021-10-28 ENCOUNTER — Ambulatory Visit: Payer: Self-pay | Admitting: Occupational Therapy

## 2021-10-30 ENCOUNTER — Ambulatory Visit: Payer: Self-pay | Admitting: Occupational Therapy

## 2021-10-30 ENCOUNTER — Ambulatory Visit: Payer: Self-pay | Admitting: Speech Pathology

## 2021-11-04 ENCOUNTER — Ambulatory Visit: Payer: Self-pay | Admitting: Occupational Therapy

## 2021-11-04 ENCOUNTER — Ambulatory Visit: Payer: Self-pay | Admitting: Speech Pathology

## 2021-11-05 ENCOUNTER — Inpatient Hospital Stay: Payer: Self-pay | Attending: Obstetrics and Gynecology

## 2021-11-06 ENCOUNTER — Ambulatory Visit: Payer: Self-pay | Admitting: Speech Pathology

## 2021-11-06 ENCOUNTER — Ambulatory Visit: Payer: Self-pay | Admitting: Occupational Therapy

## 2021-11-09 ENCOUNTER — Ambulatory Visit: Admission: RE | Admit: 2021-11-09 | Payer: Self-pay | Source: Ambulatory Visit

## 2021-11-10 ENCOUNTER — Telehealth: Payer: Self-pay

## 2021-11-10 NOTE — Telephone Encounter (Signed)
Scheduling has made attempts to contact [name] [name] and her spouse to reschedule missed MRI and gyn oncology follow up. Unable to leave message due to full voicemail. Letter sent to MyChart and home address.

## 2021-11-11 ENCOUNTER — Ambulatory Visit: Payer: Self-pay | Admitting: Occupational Therapy

## 2021-11-11 ENCOUNTER — Ambulatory Visit: Payer: Self-pay | Admitting: Speech Pathology

## 2021-11-13 ENCOUNTER — Ambulatory Visit: Payer: Self-pay | Admitting: Speech Pathology

## 2021-11-13 ENCOUNTER — Ambulatory Visit: Payer: Self-pay | Admitting: Occupational Therapy

## 2021-11-18 ENCOUNTER — Ambulatory Visit: Payer: Self-pay | Admitting: Speech Pathology

## 2021-11-18 ENCOUNTER — Ambulatory Visit: Payer: Self-pay | Admitting: Occupational Therapy

## 2021-11-20 ENCOUNTER — Ambulatory Visit: Payer: Self-pay | Admitting: Speech Pathology

## 2021-11-20 ENCOUNTER — Ambulatory Visit: Payer: Self-pay | Admitting: Occupational Therapy

## 2021-11-25 ENCOUNTER — Ambulatory Visit: Payer: Self-pay | Admitting: Speech Pathology

## 2021-11-25 ENCOUNTER — Ambulatory Visit: Payer: Self-pay | Admitting: Occupational Therapy

## 2021-11-27 ENCOUNTER — Ambulatory Visit: Payer: Self-pay | Admitting: Speech Pathology

## 2021-11-27 ENCOUNTER — Ambulatory Visit: Payer: Self-pay | Admitting: Occupational Therapy

## 2021-12-01 ENCOUNTER — Encounter: Payer: Self-pay | Admitting: Anesthesiology

## 2021-12-01 ENCOUNTER — Encounter: Admission: RE | Payer: Self-pay | Source: Ambulatory Visit

## 2021-12-01 ENCOUNTER — Ambulatory Visit: Admission: RE | Admit: 2021-12-01 | Payer: Self-pay | Source: Ambulatory Visit

## 2021-12-01 SURGERY — COLONOSCOPY WITH PROPOFOL
Anesthesia: General

## 2021-12-02 ENCOUNTER — Ambulatory Visit: Payer: Self-pay | Admitting: Speech Pathology

## 2021-12-02 ENCOUNTER — Ambulatory Visit: Payer: Self-pay | Admitting: Occupational Therapy

## 2021-12-04 ENCOUNTER — Ambulatory Visit: Payer: Self-pay | Admitting: Speech Pathology

## 2021-12-04 ENCOUNTER — Ambulatory Visit: Payer: Self-pay | Admitting: Occupational Therapy

## 2021-12-05 ENCOUNTER — Telehealth: Payer: Self-pay | Admitting: Physician Assistant

## 2021-12-05 DIAGNOSIS — B9689 Other specified bacterial agents as the cause of diseases classified elsewhere: Secondary | ICD-10-CM

## 2021-12-05 DIAGNOSIS — J310 Chronic rhinitis: Secondary | ICD-10-CM

## 2021-12-05 DIAGNOSIS — J019 Acute sinusitis, unspecified: Secondary | ICD-10-CM

## 2021-12-05 MED ORDER — IPRATROPIUM BROMIDE 0.03 % NA SOLN
2.0000 | Freq: Two times a day (BID) | NASAL | 0 refills | Status: DC
Start: 2021-12-05 — End: 2022-03-19

## 2021-12-05 MED ORDER — DOXYCYCLINE HYCLATE 100 MG PO TABS
100.0000 mg | ORAL_TABLET | Freq: Two times a day (BID) | ORAL | 0 refills | Status: DC
Start: 2021-12-05 — End: 2022-03-19

## 2021-12-05 NOTE — Patient Instructions (Signed)
[name] [name], thank you for joining Mar Daring, PA-C for today's virtual visit.  While this provider is not your primary care provider (PCP), if your PCP is located in our provider database this encounter information will be shared with them immediately following your visit.  Consent: (Patient) [name] [name] provided verbal consent for this virtual visit at the beginning of the encounter.  Current Medications:  Current Outpatient Medications:    doxycycline (VIBRA-TABS) 100 MG tablet, Take 1 tablet (100 mg total) by mouth 2 (two) times daily., Disp: 20 tablet, Rfl: 0   ipratropium (ATROVENT) 0.03 % nasal spray, Place 2 sprays into both nostrils every 12 (twelve) hours., Disp: 30 mL, Rfl: 0   amLODipine (NORVASC) 10 MG tablet, Take 1 tablet (10 mg total) by mouth daily., Disp: 30 tablet, Rfl: 5   atorvastatin (LIPITOR) 80 MG tablet, Take 1 tablet (80 mg total) by mouth daily., Disp: 30 tablet, Rfl: 5   metoprolol tartrate (LOPRESSOR) 25 MG tablet, Take 1 tablet (25 mg total) by mouth 2 (two) times daily., Disp: 60 tablet, Rfl: 5   potassium chloride SA (KLOR-CON M20) 20 MEQ tablet, Take 2 tablets (40 mEq total) by mouth daily., Disp: 60 tablet, Rfl: 5   saccharomyces boulardii (FLORASTOR) 250 MG capsule, Take 1 capsule (250 mg total) by mouth 2 (two) times daily., Disp: 60 capsule, Rfl: 0   Medications ordered in this encounter:  Meds ordered this encounter  Medications   doxycycline (VIBRA-TABS) 100 MG tablet    Sig: Take 1 tablet (100 mg total) by mouth 2 (two) times daily.    Dispense:  20 tablet    Refill:  0    Order Specific Question:   Supervising Provider    Answer:   MILLER, BRIAN [[id number]]   ipratropium (ATROVENT) 0.03 % nasal spray    Sig: Place 2 sprays into both nostrils every 12 (twelve) hours.    Dispense:  30 mL    Refill:  0    Order Specific Question:   Supervising Provider    Answer:   Sabra Heck, BRIAN [[id number]]     *If you need refills on other medications  prior to your next appointment, please contact your pharmacy*  Follow-Up: Call back or seek an in-person evaluation if the symptoms worsen or if the condition fails to improve as anticipated.  Other Instructions Sinusitis, Adult Sinusitis is inflammation of your sinuses. Sinuses are hollow spaces in the bones around your face. Your sinuses are located: Around your eyes. In the middle of your forehead. Behind your nose. In your cheekbones. Mucus normally drains out of your sinuses. When your nasal tissues become inflamed or swollen, mucus can become trapped or blocked. This allows bacteria, viruses, and fungi to grow, which leads to infection. Most infections of the sinuses are caused by a virus. Sinusitis can develop quickly. It can last for up to 4 weeks (acute) or for more than 12 weeks (chronic). Sinusitis often develops after a cold. What are the causes? This condition is caused by anything that creates swelling in the sinuses or stops mucus from draining. This includes: Allergies. Asthma. Infection from bacteria or viruses. Deformities or blockages in your nose or sinuses. Abnormal growths in the nose (nasal polyps). Pollutants, such as chemicals or irritants in the air. Infection from fungi (rare). What increases the risk? You are more likely to develop this condition if you: Have a weak body defense system (immune system). Do a lot of swimming or diving. Overuse  nasal sprays. Smoke. What are the signs or symptoms? The main symptoms of this condition are pain and a feeling of pressure around the affected sinuses. Other symptoms include: Stuffy nose or congestion. Thick drainage from your nose. Swelling and warmth over the affected sinuses. Headache. Upper toothache. A cough that may get worse at night. Extra mucus that collects in the throat or the back of the nose (postnasal drip). Decreased sense of smell and taste. Fatigue. A fever. Sore throat. Bad breath. How is  this diagnosed? This condition is diagnosed based on: Your symptoms. Your medical history. A physical exam. Tests to find out if your condition is acute or chronic. This may include: Checking your nose for nasal polyps. Viewing your sinuses using a device that has a light (endoscope). Testing for allergies or bacteria. Imaging tests, such as an MRI or CT scan. In rare cases, a bone biopsy may be done to rule out more serious types of fungal sinus disease. How is this treated? Treatment for sinusitis depends on the cause and whether your condition is chronic or acute. If caused by a virus, your symptoms should go away on their own within 10 days. You may be given medicines to relieve symptoms. They include: Medicines that shrink swollen nasal passages (topical intranasal decongestants). Medicines that treat allergies (antihistamines). A spray that eases inflammation of the nostrils (topical intranasal corticosteroids). Rinses that help get rid of thick mucus in your nose (nasal saline washes). If caused by bacteria, your health care provider may recommend waiting to see if your symptoms improve. Most bacterial infections will get better without antibiotic medicine. You may be given antibiotics if you have: A severe infection. A weak immune system. If caused by narrow nasal passages or nasal polyps, you may need to have surgery. Follow these instructions at home: Medicines Take, use, or apply over-the-counter and prescription medicines only as told by your health care provider. These may include nasal sprays. If you were prescribed an antibiotic medicine, take it as told by your health care provider. Do not stop taking the antibiotic even if you start to feel better. Hydrate and humidify  Drink enough fluid to keep your urine pale yellow. Staying hydrated will help to thin your mucus. Use a cool mist humidifier to keep the humidity level in your home above 50%. Inhale steam for 10-15  minutes, 3-4 times a day, or as told by your health care provider. You can do this in the bathroom while a hot shower is running. Limit your exposure to cool or dry air. Rest Rest as much as possible. Sleep with your head raised (elevated). Make sure you get enough sleep each night. General instructions  Apply a warm, moist washcloth to your face 3-4 times a day or as told by your health care provider. This will help with discomfort. Wash your hands often with soap and water to reduce your exposure to germs. If soap and water are not available, use hand sanitizer. Do not smoke. Avoid being around people who are smoking (secondhand smoke). Keep all follow-up visits as told by your health care provider. This is important. Contact a health care provider if: You have a fever. Your symptoms get worse. Your symptoms do not improve within 10 days. Get help right away if: You have a severe headache. You have persistent vomiting. You have severe pain or swelling around your face or eyes. You have vision problems. You develop confusion. Your neck is stiff. You have trouble breathing. Summary Sinusitis  is soreness and inflammation of your sinuses. Sinuses are hollow spaces in the bones around your face. This condition is caused by nasal tissues that become inflamed or swollen. The swelling traps or blocks the flow of mucus. This allows bacteria, viruses, and fungi to grow, which leads to infection. If you were prescribed an antibiotic medicine, take it as told by your health care provider. Do not stop taking the antibiotic even if you start to feel better. Keep all follow-up visits as told by your health care provider. This is important. This information is not intended to replace advice given to you by your health care provider. Make sure you discuss any questions you have with your health care provider. Document Revised: 04/11/2018 Document Reviewed: 04/11/2018 Elsevier Patient Education  2022  [employer].    If you have been instructed to have an in-person evaluation today at a local Urgent Care facility, please use the link below. It will take you to a list of all of our available [facility], including address, phone number and hours of operation. Please do not delay care.  [facility]  If you or a family member do not have a primary care provider, use the link below to schedule a visit and establish care. When you choose a [place] primary care physician or advanced practice provider, you gain a long-term partner in health. Find a Primary Care Provider  Learn more about [facility]'s in-office and virtual care options: [facility]

## 2021-12-05 NOTE — Progress Notes (Signed)
Virtual Visit Consent   [name] [name], you are scheduled for a virtual visit with a [facility] provider today.     Just as with appointments in the office, your consent must be obtained to participate.  Your consent will be active for this visit and any virtual visit you may have with one of our providers in the next 365 days.     If you have a MyChart account, a copy of this consent can be sent to you electronically.  All virtual visits are billed to your insurance company just like a traditional visit in the office.    As this is a virtual visit, video technology does not allow for your provider to perform a traditional examination.  This may limit your provider's ability to fully assess your condition.  If your provider identifies any concerns that need to be evaluated in person or the need to arrange testing (such as labs, EKG, etc.), we will make arrangements to do so.     Although advances in technology are sophisticated, we cannot ensure that it will always work on either your end or our end.  If the connection with a video visit is poor, the visit may have to be switched to a telephone visit.  With either a video or telephone visit, we are not always able to ensure that we have a secure connection.     I need to obtain your verbal consent now.   Are you willing to proceed with your visit today?    [name] [name] has provided verbal consent on 12/05/2021 for a virtual visit (video or telephone).   Mar Daring, PA-C   Date: 12/05/2021 9:39 AM   Virtual Visit via Video Note   I, Mar Daring, connected with  [name] [name]  ([id number], 12-30-1965) on 12/05/21 at  9:30 AM EST by a video-enabled telemedicine application and verified that I am speaking with the correct person using two identifiers.  Location: Patient: Virtual Visit Location Patient: Home Provider: Virtual Visit Location Provider: Home Office   I discussed the limitations of evaluation and management by  telemedicine and the availability of in person appointments. The patient expressed understanding and agreed to proceed.    History of Present Illness: [name] [name] is a 56 y.o. who identifies as a female who was assigned female at birth, and is being seen today for sinus infection.  HPI: Sinusitis This is a new problem. The current episode started more than 1 month ago. The problem has been gradually worsening since onset. There has been no fever. Associated symptoms include congestion, coughing and sneezing. Pertinent negatives include no chills, ear pain, headaches, hoarse voice, sinus pressure or sore throat. (Rhinorrhea and post nasal drainage) Treatments tried: antihistamine. The treatment provided no relief.     Problems:  Patient Active Problem List   Diagnosis Date Noted   Multiple lung nodules 03/27/2021   Dysarthria and anarthria 02/11/2021   Sinus tachycardia    Labile blood glucose    Dysphagia    Seizure prophylaxis    Malnutrition of moderate degree 01/23/2021   Anoxic brain injury ([place]) 01/10/2021   Prediabetes    Dyslipidemia    S/P percutaneous endoscopic gastrostomy (PEG) tube placement [facility])    Status post tracheostomy ([place])    Aspiration pneumonia of both lower lobes due to gastric secretions ([place]) 01/05/2021   Acute respiratory failure (H[facility])    Hypoxic ischemic encephalopathy    Endotracheal tube present  Cardiac arrest ([place]) 12/02/2020   Essential hypertension 12/09/2011    Allergies: No Known Allergies Medications:  Current Outpatient Medications:    doxycycline (VIBRA-TABS) 100 MG tablet, Take 1 tablet (100 mg total) by mouth 2 (two) times daily., Disp: 20 tablet, Rfl: 0   ipratropium (ATROVENT) 0.03 % nasal spray, Place 2 sprays into both nostrils every 12 (twelve) hours., Disp: 30 mL, Rfl: 0   amLODipine (NORVASC) 10 MG tablet, Take 1 tablet (10 mg total) by mouth daily., Disp: 30 tablet, Rfl: 5   atorvastatin (LIPITOR) 80 MG tablet, Take 1 tablet  (80 mg total) by mouth daily., Disp: 30 tablet, Rfl: 5   metoprolol tartrate (LOPRESSOR) 25 MG tablet, Take 1 tablet (25 mg total) by mouth 2 (two) times daily., Disp: 60 tablet, Rfl: 5   potassium chloride SA (KLOR-CON M20) 20 MEQ tablet, Take 2 tablets (40 mEq total) by mouth daily., Disp: 60 tablet, Rfl: 5   saccharomyces boulardii (FLORASTOR) 250 MG capsule, Take 1 capsule (250 mg total) by mouth 2 (two) times daily., Disp: 60 capsule, Rfl: 0  Observations/Objective: Patient is well-developed, well-nourished in no acute distress.  Resting comfortably at home.  Head is normocephalic, atraumatic.  No labored breathing.  Speech is clear and coherent with logical content.  Patient is alert and oriented at baseline.    Assessment and Plan: 1. Acute bacterial sinusitis - doxycycline (VIBRA-TABS) 100 MG tablet; Take 1 tablet (100 mg total) by mouth 2 (two) times daily.  Dispense: 20 tablet; Refill: 0  2. Chronic rhinitis - ipratropium (ATROVENT) 0.03 % nasal spray; Place 2 sprays into both nostrils every 12 (twelve) hours.  Dispense: 30 mL; Refill: 0  - Worsening symptoms that have not responded to OTC medications.  - Will give Doxycycline  - Ipratropium added for nasal congestion and drainage - Continue allergy medications.  - Stay well hydrated and get plenty of rest.  - Seek in person evaluation if no symptom improvement or if symptoms worsen.  Follow Up Instructions: I discussed the assessment and treatment plan with the patient. The patient was provided an opportunity to ask questions and all were answered. The patient agreed with the plan and demonstrated an understanding of the instructions.  A copy of instructions were sent to the patient via MyChart unless otherwise noted below.   The patient was advised to call back or seek an in-person evaluation if the symptoms worsen or if the condition fails to improve as anticipated.  Time:  I spent 10 minutes with the patient via  telehealth technology discussing the above problems/concerns.    Mar Daring, PA-C

## 2021-12-09 ENCOUNTER — Ambulatory Visit: Payer: Self-pay | Admitting: Occupational Therapy

## 2021-12-09 ENCOUNTER — Ambulatory Visit: Payer: Self-pay | Admitting: Speech Pathology

## 2021-12-11 ENCOUNTER — Ambulatory Visit: Payer: Self-pay | Admitting: Speech Pathology

## 2021-12-11 ENCOUNTER — Ambulatory Visit: Payer: Self-pay | Admitting: Occupational Therapy

## 2021-12-16 ENCOUNTER — Ambulatory Visit: Payer: Self-pay | Admitting: Occupational Therapy

## 2021-12-16 ENCOUNTER — Ambulatory Visit: Payer: Self-pay | Admitting: Speech Pathology

## 2021-12-18 ENCOUNTER — Ambulatory Visit: Payer: Self-pay | Admitting: Speech Pathology

## 2021-12-18 ENCOUNTER — Ambulatory Visit: Payer: Self-pay | Admitting: Occupational Therapy

## 2021-12-23 ENCOUNTER — Ambulatory Visit: Payer: Self-pay | Admitting: Occupational Therapy

## 2021-12-23 ENCOUNTER — Ambulatory Visit: Payer: Self-pay | Admitting: Speech Pathology

## 2021-12-25 ENCOUNTER — Ambulatory Visit: Payer: Self-pay | Admitting: Speech Pathology

## 2021-12-25 ENCOUNTER — Ambulatory Visit: Payer: Self-pay | Admitting: Occupational Therapy

## 2021-12-30 ENCOUNTER — Ambulatory Visit: Payer: Self-pay | Admitting: Occupational Therapy

## 2021-12-30 ENCOUNTER — Ambulatory Visit: Payer: Self-pay | Admitting: Speech Pathology

## 2022-01-01 ENCOUNTER — Ambulatory Visit: Payer: Self-pay | Admitting: Speech Pathology

## 2022-01-01 ENCOUNTER — Ambulatory Visit: Payer: Self-pay | Admitting: Occupational Therapy

## 2022-01-06 ENCOUNTER — Encounter: Payer: Self-pay | Admitting: Occupational Therapy

## 2022-01-06 ENCOUNTER — Encounter: Payer: Self-pay | Admitting: Speech Pathology

## 2022-01-08 ENCOUNTER — Encounter: Payer: Self-pay | Admitting: Occupational Therapy

## 2022-01-08 ENCOUNTER — Encounter: Payer: Self-pay | Admitting: Speech Pathology

## 2022-01-13 ENCOUNTER — Encounter: Payer: Self-pay | Admitting: Occupational Therapy

## 2022-01-13 ENCOUNTER — Encounter: Payer: Self-pay | Admitting: Speech Pathology

## 2022-01-15 ENCOUNTER — Encounter: Payer: Self-pay | Admitting: Speech Pathology

## 2022-01-15 ENCOUNTER — Encounter: Payer: Self-pay | Admitting: Occupational Therapy

## 2022-01-20 ENCOUNTER — Encounter: Payer: Self-pay | Admitting: Occupational Therapy

## 2022-01-20 ENCOUNTER — Encounter: Payer: Self-pay | Admitting: Speech Pathology

## 2022-01-22 ENCOUNTER — Encounter: Payer: Self-pay | Admitting: Occupational Therapy

## 2022-01-22 ENCOUNTER — Encounter: Payer: Self-pay | Admitting: Speech Pathology

## 2022-02-06 ENCOUNTER — Ambulatory Visit
Admission: RE | Admit: 2022-02-06 | Discharge: 2022-02-06 | Disposition: A | Discharge status: Home / Self Care | Payer: Self-pay | Attending: Gastroenterology | Admitting: Gastroenterology

## 2022-02-06 ENCOUNTER — Ambulatory Visit: Payer: Self-pay | Admitting: Anesthesiology

## 2022-02-06 ENCOUNTER — Encounter
Admission: RE | Disposition: A | Discharge status: Home / Self Care | Payer: Self-pay | Source: Home / Self Care | Attending: Gastroenterology

## 2022-02-06 ENCOUNTER — Other Ambulatory Visit: Payer: Self-pay

## 2022-02-06 ENCOUNTER — Encounter: Payer: Self-pay | Admitting: Anesthesiology

## 2022-02-06 DIAGNOSIS — E669 Obesity, unspecified: Secondary | ICD-10-CM | POA: Insufficient documentation

## 2022-02-06 DIAGNOSIS — R000 Tachycardia, unspecified: Secondary | ICD-10-CM | POA: Insufficient documentation

## 2022-02-06 DIAGNOSIS — Z8674 Personal history of sudden cardiac arrest: Secondary | ICD-10-CM | POA: Insufficient documentation

## 2022-02-06 DIAGNOSIS — K6389 Other specified diseases of intestine: Secondary | ICD-10-CM

## 2022-02-06 DIAGNOSIS — Z6838 Body mass index (BMI) 38.0-38.9, adult: Secondary | ICD-10-CM | POA: Insufficient documentation

## 2022-02-06 DIAGNOSIS — Z87820 Personal history of traumatic brain injury: Secondary | ICD-10-CM | POA: Insufficient documentation

## 2022-02-06 DIAGNOSIS — I10 Essential (primary) hypertension: Secondary | ICD-10-CM | POA: Insufficient documentation

## 2022-02-06 DIAGNOSIS — Z931 Gastrostomy status: Secondary | ICD-10-CM | POA: Insufficient documentation

## 2022-02-06 DIAGNOSIS — D125 Benign neoplasm of sigmoid colon: Secondary | ICD-10-CM | POA: Insufficient documentation

## 2022-02-06 DIAGNOSIS — Z1211 Encounter for screening for malignant neoplasm of colon: Secondary | ICD-10-CM | POA: Insufficient documentation

## 2022-02-06 DIAGNOSIS — D124 Benign neoplasm of descending colon: Secondary | ICD-10-CM | POA: Insufficient documentation

## 2022-02-06 DIAGNOSIS — D126 Benign neoplasm of colon, unspecified: Principal | ICD-10-CM

## 2022-02-06 DIAGNOSIS — K640 First degree hemorrhoids: Secondary | ICD-10-CM | POA: Insufficient documentation

## 2022-02-06 HISTORY — DX: Other specified diseases of intestine: K63.89

## 2022-02-06 HISTORY — DX: Benign neoplasm of colon, unspecified: D12.6

## 2022-02-06 HISTORY — PX: COLONOSCOPY WITH PROPOFOL: SHX5780

## 2022-02-06 SURGERY — COLONOSCOPY WITH PROPOFOL
Anesthesia: General

## 2022-02-06 MED ORDER — SPOT INK MARKER SYRINGE KIT
PACK | SUBMUCOSAL | Status: DC | PRN
  Administered 2022-02-06: 1 mL via SUBMUCOSAL

## 2022-02-06 MED ORDER — SODIUM CHLORIDE 0.9 % IV SOLN: INTRAVENOUS | Status: DC

## 2022-02-06 MED ORDER — PROPOFOL 500 MG/50ML IV EMUL
INTRAVENOUS | Status: DC | PRN
  Administered 2022-02-06: 150 ug/kg/min via INTRAVENOUS

## 2022-02-06 MED ORDER — PROPOFOL 500 MG/50ML IV EMUL
INTRAVENOUS | Status: AC
  Filled 2022-02-06: qty 50

## 2022-02-06 NOTE — H&P (Signed)
Outpatient short stay form Pre-procedure ?02/06/2022  ?Lesly Rubenstein, MD ? ?Primary Physician: Marinda Elk, MD ? ?Reason for visit:  Screening ? ?History of present illness:   ? ?56 y/o lady with history of TBI here for screening colonoscopy. No family history of colon cancer. History of PEG tube. No blood thinners. ? ? ? ?Current Facility-Administered Medications:  ?  0.9 %  sodium chloride infusion, , Intravenous, Continuous, Hayzlee Mcsorley, Hilton Cork, MD, Last Rate: 20 mL/hr at 02/06/22 1211, New Bag at 02/06/22 1211 ? ?Medications Prior to Admission  ?Medication Sig Dispense Refill Last Dose  ? doxycycline (VIBRA-TABS) 100 MG tablet Take 1 tablet (100 mg total) by mouth 2 (two) times daily. 20 tablet 0 02/05/2022  ? metoprolol tartrate (LOPRESSOR) 25 MG tablet Take 1 tablet (25 mg total) by mouth 2 (two) times daily. 60 tablet 5 02/05/2022  ? potassium chloride SA (KLOR-CON M20) 20 MEQ tablet Take 2 tablets (40 mEq total) by mouth daily. 60 tablet 5 02/05/2022  ? amLODipine (NORVASC) 10 MG tablet Take 1 tablet (10 mg total) by mouth daily. 30 tablet 5   ? atorvastatin (LIPITOR) 80 MG tablet Take 1 tablet (80 mg total) by mouth daily. 30 tablet 5   ? ipratropium (ATROVENT) 0.03 % nasal spray Place 2 sprays into both nostrils every 12 (twelve) hours. (Patient not taking: Reported on 02/06/2022) 30 mL 0 Not Taking  ? saccharomyces boulardii (FLORASTOR) 250 MG capsule Take 1 capsule (250 mg total) by mouth 2 (two) times daily. (Patient not taking: Reported on 02/06/2022) 60 capsule 0 Not Taking  ? ? ? ?No Known Allergies ? ? ?Past Medical History:  ?Diagnosis Date  ? Anoxic brain injury ([place])   ? Cardiac arrest [facility])   ? Cognitive communication deficit   ? Hyperlipidemia   ? Hypertension   ? Seizures ([place])   ? Ventricular fibrillation ([place])   ? ? ?Review of systems:  Otherwise negative.  ? ? ?Physical Exam ? ?Gen: Alert, oriented. Appears stated age.  ?HEENT: PERRLA. ?Lungs: No respiratory distress ?CV: RRR ?Abd:  soft, benign, no masses ?Ext: No edema ? ? ? ?Planned procedures: Proceed with colonoscopy. The patient understands the nature of the planned procedure, indications, risks, alternatives and potential complications including but not limited to bleeding, infection, perforation, damage to internal organs and possible oversedation/side effects from anesthesia. The patient agrees and gives consent to proceed.  ?Please refer to procedure notes for findings, recommendations and patient disposition/instructions.  ? ? ? ?Lesly Rubenstein, MD ?[facility] ? ? ? ?  ? ?

## 2022-02-06 NOTE — Op Note (Addendum)
[facility] ?Gastroenterology ?Patient Name: [name] [name] ?Procedure Date: 02/06/2022 11:22 AM ?MRN: [id number] ?Account #: [id number] ?Date of Birth: [birth year] ?Admit Type: Outpatient ?Age: 56 ?Room: [facility] ENDO ROOM 3 ?Gender: Female ?Note Status: Finalized ?Instrument Name: Colonoscope [id number] ?Procedure:             Colonoscopy ?Indications:           Screening for colorectal malignant neoplasm ?Providers:             Andrey Farmer MD, MD ?Medicines:             Monitored Anesthesia Care ?Complications:         No immediate complications. Estimated blood loss:  ?                       Minimal. ?Procedure:             Pre-Anesthesia Assessment: ?                       - Prior to the procedure, a History and Physical was  ?                       performed, and patient medications and allergies were  ?                       reviewed. The patient is competent. The risks and  ?                       benefits of the procedure and the sedation options and  ?                       risks were discussed with the patient. All questions  ?                       were answered and informed consent was obtained.  ?                       Patient identification and proposed procedure were  ?                       verified by the physician, the nurse, the  ?                       anesthesiologist, the anesthetist and the technician  ?                       in the endoscopy suite. Mental Status Examination:  ?                       alert and oriented. Airway Examination: normal  ?                       oropharyngeal airway and neck mobility. Respiratory  ?                       Examination: clear to auscultation. CV Examination:  ?                       normal. Prophylactic Antibiotics: The patient does not  ?  require prophylactic antibiotics. Prior  ?                       Anticoagulants: The patient has taken no previous  ?                       anticoagulant or antiplatelet agents. ASA  Grade  ?                       Assessment: III - A patient with severe systemic  ?                       disease. After reviewing the risks and benefits, the  ?                       patient was deemed in satisfactory condition to  ?                       undergo the procedure. The anesthesia plan was to use  ?                       monitored anesthesia care (MAC). Immediately prior to  ?                       administration of medications, the patient was  ?                       re-assessed for adequacy to receive sedatives. The  ?                       heart rate, respiratory rate, oxygen saturations,  ?                       blood pressure, adequacy of pulmonary ventilation, and  ?                       response to care were monitored throughout the  ?                       procedure. The physical status of the patient was  ?                       re-assessed after the procedure. ?                       After obtaining informed consent, the colonoscope was  ?                       passed under direct vision. Throughout the procedure,  ?                       the patient's blood pressure, pulse, and oxygen  ?                       saturations were monitored continuously. The  ?                       Colonoscope was introduced through the anus and  ?  advanced to the the cecum, identified by appendiceal  ?                       orifice and ileocecal valve. The colonoscopy was  ?                       performed without difficulty. The patient tolerated  ?                       the procedure well. The quality of the bowel  ?                       preparation was adequate to identify polyps. ?Findings: ?     The perianal and digital rectal examinations were normal. ?     An ulcerated non-obstructing medium-sized mass was found in the  ?     descending colon. The mass was partially circumferential (involving  ?     one-third of the lumen circumference). The mass measured two cm in  ?     length.  In addition, its diameter measured twenty-seven mm. No bleeding  ?     was present. This was biopsied with a cold forceps for histology. Area  ?     was tattooed with an injection of 0.8 mL of [place] ink. Unfortunately  ?     tattoo was placed a little close to the lesion which may make future  ?     endoscopic removal more challenging. ?     A 5 mm polyp was found in the sigmoid colon. The polyp was  ?     semi-pedunculated. The polyp was removed with a cold snare. Resection  ?     and retrieval were complete. Estimated blood loss was minimal. ?     Internal hemorrhoids were found during retroflexion. The hemorrhoids  ?     were Grade I (internal hemorrhoids that do not prolapse). ?     The exam was otherwise without abnormality on direct and retroflexion  ?     views. ?Impression:            - Tumor in the descending colon. Biopsied. Tattooed. ?                       - One 5 mm polyp in the sigmoid colon, removed with a  ?                       cold snare. Resected and retrieved. ?                       - Internal hemorrhoids. ?                       - The examination was otherwise normal on direct and  ?                       retroflexion views. ?Recommendation:        - Discharge patient to home. ?                       - Resume previous diet. ?                       -  Continue present medications. ?                       - Await pathology results. ?                       - Repeat colonoscopy date to be determined after  ?                       pending pathology results are reviewed. ?                       - Return to referring physician as previously  ?                       scheduled. ?Procedure Code(s):     --- Professional --- ?                       [phone], Colonoscopy, flexible; with removal of  ?                       tumor(s), polyp(s), or other lesion(s) by snare  ?                       technique ?                       45381, Colonoscopy, flexible; with directed submucosal  ?                        injection(s), any substance ?                       45380, 59, Colonoscopy, flexible; with biopsy, single  ?                       or multiple ?CPT copyright 2019 American Medical Association. All rights reserved. ?The codes documented in this report are preliminary and upon coder review may  ?be revised to meet current compliance requirements. ?Andrey Farmer MD, MD ?02/06/2022 1:16:03 PM ?Number of Addenda: 0 ?Note Initiated On: 02/06/2022 11:22 AM ?Scope Withdrawal Time: 0 hours 6 minutes 38 seconds  ?Total Procedure Duration: 0 hours 14 minutes 21 seconds  ?Estimated Blood Loss:  Estimated blood loss was minimal. ?     [facility] ?

## 2022-02-06 NOTE — Transfer of Care (Signed)
Immediate Anesthesia Transfer of Care Note ? ?Patient: [name] [name] ? ?Procedure(s) Performed: COLONOSCOPY WITH PROPOFOL ? ?Patient Location: PACU ? ?Anesthesia Type:General ? ?Level of Consciousness: awake and sedated ? ?Airway & Oxygen Therapy: Patient Spontanous Breathing and Patient connected to face mask oxygen ? ?Post-op Assessment: Report given to RN and Post -op Vital signs reviewed and stable ? ?Post vital signs: Reviewed and stable ? ?Last Vitals:  ?Vitals Value Taken Time  ?BP    ?Temp    ?Pulse    ?Resp    ?SpO2    ? ? ?Last Pain:  ?Vitals:  ? 02/06/22 1155  ?TempSrc: Temporal  ?PainSc: 0-No pain  ?   ? ?  ? ?Complications: No notable events documented. ?

## 2022-02-06 NOTE — Anesthesia Postprocedure Evaluation (Signed)
Anesthesia Post Note ? ?Patient: [name] [name] ? ?Procedure(s) Performed: COLONOSCOPY WITH PROPOFOL ? ?Patient location during evaluation: PACU ?Anesthesia Type: General ?Level of consciousness: awake and alert ?Pain management: pain level controlled ?Vital Signs Assessment: post-procedure vital signs reviewed and stable ?Respiratory status: spontaneous breathing, nonlabored ventilation and respiratory function stable ?Cardiovascular status: blood pressure returned to baseline and stable ?Postop Assessment: no apparent nausea or vomiting ?Anesthetic complications: no ? ? ?No notable events documented. ? ? ?Last Vitals:  ?Vitals:  ? 02/06/22 1318 02/06/22 1328  ?BP: 118/90 (!) 161/92  ?Pulse: 93 78  ?Resp: 18 13  ?Temp:    ?SpO2: 99% 99%  ?  ?Last Pain:  ?Vitals:  ? 02/06/22 1328  ?TempSrc:   ?PainSc: 0-No pain  ? ? ?  ?  ?  ?  ?  ?  ? ?[place] Ouch ? ? ? ? ?

## 2022-02-06 NOTE — Anesthesia Preprocedure Evaluation (Addendum)
Anesthesia Evaluation  ?Patient identified by MRN, date of birth, ID bandGeneral Assessment Comment:Pt admitted 1/10 with cardiac arrest. ?Intubated since then, diffuse brain damage. ?Echo OK ? ?Reviewed: ?Allergy & Precautions, NPO status , Patient's Chart, lab work & pertinent test results, reviewed documented beta blocker date and time  ? ?History of Anesthesia Complications ?Negative for: history of anesthetic complications ? ?Airway ?Mallampati: III ? ?TM Distance: >3 FB ? ? ? ? Dental ? ?(+) Teeth Intact ?  ?Pulmonary ?neg pulmonary ROS, neg sleep apnea, neg COPD, Patient abstained from smoking.Not current smoker, former smoker,  ?  ?Pulmonary exam normal ?breath sounds clear to auscultation ? ? ? ? ? ? Cardiovascular ?METShypertension, Pt. on home beta blockers and Pt. on medications ?(-) CAD and (-) Past MI + dysrhythmias  ?Rhythm:Regular Rate:Tachycardia ?- Systolic murmurs ?TTE 12/03/20: ?1. Left ventricular ejection fraction, by estimation, is 60 to 65%. The  ?left ventricle has normal function. The left ventricle has no regional  ?wall motion abnormalities. There is mild left ventricular hypertrophy.  ?Left ventricular diastolic parameters  ?were normal.  ??2. Right ventricular systolic function is normal. The right ventricular  ?size is normal. Tricuspid regurgitation signal is inadequate for assessing  ?PA pressure.  ??3. A small pericardial effusion is present. The pericardial effusion is  ?posterior and lateral to the left ventricle.  ??4. The mitral valve is grossly normal. No evidence of mitral valve  ?regurgitation. No evidence of mitral stenosis.  ??5. The aortic valve is tricuspid. Aortic valve regurgitation is not  ?visualized. No aortic stenosis is present.  ??6. The inferior vena cava is normal in size with greater than 50%  ?respiratory variability, suggesting right atrial pressure of 3 mmHg.  ?  ?Neuro/Psych ?negative neurological ROS ? negative psych ROS  ?  GI/Hepatic ?neg GERD  ,(+)  ?  ? (-) substance abuse ? , S/P percutaneous endoscopic gastrostomy (PEG) tube placement ?  ?dysphagia ?  ?Endo/Other  ?neg diabetes ? Renal/GU ?negative Renal ROS  ? ?  ?Musculoskeletal ? ? Abdominal ?(+) + obese,   ?Peds ? Hematology ?  ?Anesthesia Other Findings ?H/o respiratory failure and cardiac arrest on 1/22 with anoxic brain injury necessitating tracheostomy and peg tube.  ? Reproductive/Obstetrics ? ?  ? ? ? ? ? ? ? ? ? ? ? ? ? ?  ?  ? ? ? ? ? ? ? ?Anesthesia Physical ? ?Anesthesia Plan ? ?ASA: 3 ? ?Anesthesia Plan: General  ? ?Post-op Pain Management:   ? ?Induction: Intravenous ? ?PONV Risk Score and Plan: 3 and Treatment may vary due to age or medical condition, Propofol infusion and TIVA ? ?Airway Management Planned: Natural Airway and Simple Face Mask ? ?Additional Equipment: None ? ?Intra-op Plan:  ? ?Post-operative Plan:  ? ?Informed Consent: I have reviewed the patients History and Physical, chart, labs and discussed the procedure including the risks, benefits and alternatives for the proposed anesthesia with the patient or authorized representative who has indicated his/her understanding and acceptance.  ? ? ? ?Dental advisory given ? ?Plan Discussed with: Surgeon, CRNA and Anesthesiologist ? ?Anesthesia Plan Comments:   ? ? ? ? ? ?Anesthesia Quick Evaluation ? ?

## 2022-02-06 NOTE — Interval H&P Note (Signed)
History and Physical Interval Note: ? ?02/06/2022 ?12:33 PM ? ?[name] [name]  has presented today for surgery, with the diagnosis of COLON CANCER SCREENING.  The various methods of treatment have been discussed with the patient and family. After consideration of risks, benefits and other options for treatment, the patient has consented to  Procedure(s): ?COLONOSCOPY WITH PROPOFOL (N/A) as a surgical intervention.  The patient's history has been reviewed, patient examined, no change in status, stable for surgery.  I have reviewed the patient's chart and labs.  Questions were answered to the patient's satisfaction.   ? ? ?Hilton Cork Marquesha Robideau ? ?Ok to proceed with colonoscopy ?

## 2022-02-06 NOTE — Anesthesia Procedure Notes (Signed)
Date/Time: 02/06/2022 12:50 PM ?Performed by: Vaughan Sine ?Pre-anesthesia Checklist: Patient identified, Emergency Drugs available, Suction available, Patient being monitored and Timeout performed ?Patient Re-evaluated:Patient Re-evaluated prior to induction ?Oxygen Delivery Method: Supernova nasal CPAP ?Preoxygenation: Pre-oxygenation with 100% oxygen ?Induction Type: IV induction ?Placement Confirmation: positive ETCO2 and CO2 detector ? ? ? ? ?

## 2022-02-09 LAB — SURGICAL PATHOLOGY

## 2022-02-17 ENCOUNTER — Telehealth: Payer: Self-pay | Admitting: Cardiovascular Disease

## 2022-02-17 NOTE — Telephone Encounter (Signed)
? ?  Pre-operative Risk Assessment  ?  ?Patient Name: [name] [name]  ?DOB: Oct 03, 1966 ?MRN: [id number]  ? ?  ? ?Request for Surgical Clearance   ? ?Procedure:   Robotic Partial Colectomy ? ?Date of Surgery:  Clearance TBD                              ?   ?Surgeon:  Dr Peyton Najjar ?Surgeon's Group or Practice Name:  [facility] ?Phone number:  [phone] ?Fax number:  [phone] ?  ?Type of Clearance Requested:   ?- Medical  ?  ?Type of Anesthesia:  Not Indicated ?  ?Additional requests/questions:   ? ?Signed, ?Caryl Pina Gerringer   ?02/17/2022, 3:49 PM  ? ?

## 2022-02-17 NOTE — Telephone Encounter (Signed)
? ?  Name: [name] [name]  ?DOB: 12-05-1965  ?MRN: [id number] ? ?Primary Cardiologist: Ida Rogue, MD ? ?Chart reviewed as part of pre-operative protocol coverage. Because of [name]'s past medical history and time since last visit, she will require a follow-up visit in order to better assess preoperative cardiovascular risk. Given history of ventricular fibrillation cardiac arrest in 2022 and time since last visit in 03/2021, recommend in-office visit for surgical clearance with EKG. ? ?Pre-op covering staff: ?- Please schedule appointment and call patient to inform them.  ?- Please contact requesting surgeon's office via preferred method (i.e, phone, fax) to inform them of need for appointment prior to surgery. ? ?No blood thinners listed on MAR at this time. ? ?Charlie Pitter, PA-C  ?02/17/2022, 4:44 PM  ? ?

## 2022-02-18 NOTE — Telephone Encounter (Signed)
Attempted to schedule.  

## 2022-02-26 NOTE — Progress Notes (Signed)
Cardiology Office Note ? ?Date:  02/27/2022  ? ?ID:  [name] [name], DOB 1966-02-22, MRN [id number] ? ?PCP:  Marinda Elk, MD  ? ?Chief Complaint  ?Patient presents with  ? 12 month follow up   ?  "Doing well." Medications reviewed by the patient verbally.   ? ? ?HPI:  ?[name] [name] is a 56 year old woman presented to the hospital December 02, 2020 with V. fib arrest, January 2022 in the setting of potassium 2.1  ?Husband found her at 5 AM, gurgling, apneic, called 911 moved her to the ground started CPR,  ?EMT shock x2 V. 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 ?Last seen in clinic by myself May 2022 ?Presents for preoperative evaluation for Robotic Partial Colectomy ? ?Reports that she feels well no complaints ?BP elevated today, did not take her medications this morning ?Daughter checks it at home typically 140/80, occasionally higher ? ?Walks a lot, denies chest pain or shortness of breath on exertion ? ?Reports that she stays busy, hobbies include: painting ? ?EKG personally reviewed by myself on todays visit ?NSR rate 71 bpm, no significant ST or T wave abnormality ? ?Previous records reviewed ?Echocardiogram showing normal ejection fraction day after admission  ?PEG tube placed December 13, 2020, trach  ?December 30, 2020 transferred out of the unit  ?January 05, 2021 concern for aspiration pneumonia, some low-grade fever, possible aspiration event  ?Currently on antibiotics  ?   ?For anoxic brain injury being treated with Seroquel, Keppra, Klonopin  ?Unasyn for antibiotics  ? ?labs last month, potassium 3.5 ? ?Cardiac CTA 02/2021 ?1. Normal coronary calcium score of 0. Patient is low risk for ?coronary events. ?2. Normal coronary origin with right  dominance. ?3. No evidence of CAD. ?4. CAD-RADS 0. Consider non-atherosclerotic causes of cardiac ?arrest. ?  ?Overall she feels well no complaints, active, no bit of trouble with her speech and typing on a computer otherwise feels she is at her baseline ? ?EKG personally reviewed by myself on todays visit ?Shows normal sinus rhythm rate 60 bpm no significant ST-T wave changes ? ?PMH:   has a past medical history of Anoxic brain injury [facility]), Cardiac arrest ([place]), Cognitive communication deficit, Hyperlipidemia, Hypertension, Seizures ([place]), and Ventricular fibrillation ([place]). ? ?PSH:    ?Past Surgical History:  ?Procedure Laterality Date  ? COLONOSCOPY WITH PROPOFOL N/A 02/06/2022  ? Procedure: COLONOSCOPY WITH PROPOFOL;  Surgeon: Lesly Rubenstein, MD;  Location: [facility] ENDOSCOPY;  Service: Endoscopy;  Laterality: N/A;  ? PEG PLACEMENT N/A 12/13/2020  ? Procedure: PERCUTANEOUS ENDOSCOPIC GASTROSTOMY (PEG) PLACEMENT;  Surgeon: Lesly Rubenstein, MD;  Location: [facility] ENDOSCOPY;  Service: Endoscopy;  Laterality: N/A;  ? TRACHEOSTOMY TUBE PLACEMENT N/A 12/13/2020  ? Procedure: TRACHEOSTOMY;  Surgeon: Clyde Canterbury, MD;  Location: [facility] ORS;  Service: ENT;  Laterality: N/A;  ? ? ?Current Outpatient Medications  ?Medication Sig Dispense Refill  ? amLODipine (NORVASC) 10 MG tablet Take 1 tablet (10 mg total) by mouth daily. 30 tablet 5  ? atorvastatin (LIPITOR) 80 MG tablet Take 1 tablet (80 mg total) by mouth daily. 30 tablet 5  ? carvedilol (COREG) 12.5 MG tablet Take 1 tablet (12.5 mg total) by mouth 2 (two) times daily. 180 tablet 3  ?  doxycycline (VIBRA-TABS) 100 MG tablet Take 1 tablet (100 mg total) by mouth 2 (two) times daily. 20 tablet 0  ? ipratropium (ATROVENT) 0.03 % nasal spray Place 2 sprays into both nostrils every 12 (twelve) hours. 30 mL 0  ? potassium chloride SA (KLOR-CON M20) 20 MEQ tablet Take 2 tablets (40 mEq total) by mouth daily. 60 tablet 5  ? saccharomyces boulardii (FLORASTOR) 250 MG capsule  Take 1 capsule (250 mg total) by mouth 2 (two) times daily. 60 capsule 0  ? ?No current facility-administered medications for this visit.  ? ? ? ?Allergies:   Patient has no known allergies.  ? ?Social History:  The patient  reports that she quit smoking about 16 years ago. She has never used smokeless tobacco. She reports that she does not drink alcohol and does not use drugs.  ? ?Family History:   family history includes Diabetes in her mother; Healthy in her father; Hypertension in her mother; Leukemia in her mother.  ? ? ?Review of Systems: ?Review of Systems  ?Constitutional: Negative.   ?HENT: Negative.    ?Respiratory: Negative.    ?Cardiovascular: Negative.   ?Gastrointestinal: Negative.   ?Musculoskeletal: Negative.   ?Neurological: Negative.   ?Psychiatric/Behavioral: Negative.    ?All other systems reviewed and are negative. ? ?PHYSICAL EXAM: ?VS:  BP (!) 190/100 (BP Location: Left Arm, Patient Position: Sitting, Cuff Size: Large)   Pulse 71   Ht '5\' 1"'$  (1.549 m)   Wt 95.4 kg   SpO2 98%   BMI 39.75 kg/m?  , BMI Body mass index is 39.75 kg/m?. ?GEN: Well nourished, well developed, in no acute distress ?HEENT: normal ?Neck: no JVD, carotid bruits, or masses ?Cardiac: RRR; no murmurs, rubs, or gallops,no edema  ?Respiratory:  clear to auscultation bilaterally, normal work of breathing ?GI: soft, nontender, nondistended, + BS ?MS: no deformity or atrophy ?Skin: warm and dry, no rash ?Neuro:  Strength and sensation are intact ?Psych: euthymic mood, full affect ? ? ?Recent Labs: ?02/28/2021: BUN 8; Potassium 3.5; Sodium 141 ?04/17/2021: Creatinine, Ser 0.40  ? ? ?Lipid Panel ?Lab Results  ?Component Value Date  ? TRIG 78 12/11/2020  ? ?  ? ?Wt Readings from Last 3 Encounters:  ?02/27/22 95.4 kg  ?02/06/22 92.5 kg  ?09/10/21 89.8 kg  ?  ? ? ? ?ASSESSMENT AND PLAN: ? ?Problem List Items Addressed This Visit   ? ? Anoxic brain injury ([place])  ? Dyslipidemia  ? Essential hypertension  ? Relevant Medications  ?  carvedilol (COREG) 12.5 MG tablet  ? Other Relevant Orders  ? EKG 12-Lead  ? Acute respiratory failure ([place])  ? Cardiac arrest [facility]) - Primary  ? Relevant Medications  ? carvedilol (COREG) 12.5 MG tablet  ? Other Relevant Orders  ? EKG 12-Lead  ? ?Other Visit Diagnoses   ? ? Ventricular fibrillation ([place])      ? Relevant Medications  ? carvedilol (COREG) 12.5 MG tablet  ? Other Relevant Orders  ? EKG 12-Lead  ? Secondary DM with DKA, uncontrolled ([place])      ? Mixed hyperlipidemia      ? Relevant Medications  ? carvedilol (COREG) 12.5 MG tablet  ? ?  ? ?Preop cardiovascular evaluation for colon surgery ?Blood pressure markedly elevated on today's visit, medication changes as below, need to get numbers at guideline for having surgery ?Did not take her medications this morning ?Medication changes made as below, recommend she call us with blood pressure measurements in the next week  or so ?Certainly would need better blood pressure control prior to any surgical procedures ? ?V. fib arrest  ?In the setting of DKA with severe acidosis, potassium 2.1, CO2 20, lactic acid 10 Anion gap 21  ?-Normal echocardiogram, minimally elevated troponin  ?Cardiac CTA since that time no coronary disease ?No further cardiac testing needed ?   ?Essential hypertension  ?On amlodipine metoprolol tartrate ,did not take her medications this morning ?Blood pressure markedly elevated ?Starting to come down on my recheck 170/95 ?Recommend she stop the metoprolol, changed to carvedilol 12.5 twice daily continue amlodipine, call us with blood pressure measurements in the next several days ?-Avoid diuretic blood pressure medications given history of hypokalemia ?   ?Anoxic brain injury  ?Following cardiac arrest,  ?Has made good recovery ?   ?Diabetes type 2 ?A1c 6.4 ?Weight running high ? ? Total encounter time more than 30 minutes ? Greater than 50% was spent in counseling and coordination of care with the patient ? ? ? ?Signed, ?Esmond Plants, M.D.,  Ph.D. ?[facility] [place], [place] ?[phone] ?

## 2022-02-27 ENCOUNTER — Ambulatory Visit (INDEPENDENT_AMBULATORY_CARE_PROVIDER_SITE_OTHER): Payer: Self-pay | Admitting: Cardiovascular Disease

## 2022-02-27 ENCOUNTER — Encounter: Payer: Self-pay | Admitting: Cardiovascular Disease

## 2022-02-27 VITALS — BP 190/100 | HR 71 | Ht 61.0 in | Wt 210.4 lb

## 2022-02-27 DIAGNOSIS — E785 Hyperlipidemia, unspecified: Secondary | ICD-10-CM

## 2022-02-27 DIAGNOSIS — I4901 Ventricular fibrillation: Secondary | ICD-10-CM

## 2022-02-27 DIAGNOSIS — E1310 Other specified diabetes mellitus with ketoacidosis without coma: Secondary | ICD-10-CM

## 2022-02-27 DIAGNOSIS — J9601 Acute respiratory failure with hypoxia: Secondary | ICD-10-CM

## 2022-02-27 DIAGNOSIS — I10 Essential (primary) hypertension: Secondary | ICD-10-CM

## 2022-02-27 DIAGNOSIS — G931 Anoxic brain damage, not elsewhere classified: Secondary | ICD-10-CM

## 2022-02-27 DIAGNOSIS — I469 Cardiac arrest, cause unspecified: Secondary | ICD-10-CM

## 2022-02-27 DIAGNOSIS — E782 Mixed hyperlipidemia: Secondary | ICD-10-CM

## 2022-02-27 MED ORDER — CARVEDILOL 12.5 MG PO TABS: 12.5000 mg | ORAL_TABLET | Freq: Two times a day (BID) | ORAL | 3 refills | Status: DC

## 2022-02-27 NOTE — Patient Instructions (Signed)
Medication Instructions:  ?Please stop the metoprolol ?Change to coreg 12.5 twice a day ? ?Monitor blood pressure ?Call with numbers ? ?If you need a refill on your cardiac medications before your next appointment, please call your pharmacy.  ? ?Lab work: ?No new labs needed ? ?Testing/Procedures: ?No new testing needed ? ?Follow-Up: ?At [facility], you and your health needs are our priority.  As part of our continuing mission to provide you with exceptional heart care, we have created designated Provider Care Teams.  These Care Teams include your primary Cardiologist (physician) and Advanced Practice Providers (APPs -  Physician Assistants and Nurse Practitioners) who all work together to provide you with the care you need, when you need it. ? ?You will need a follow up appointment in 6 months  , app ok ? ?Providers on your designated Care Team:   ?Murray Hodgkins, NP ?Christell Faith, PA-C ?Cadence Kathlen Mody, PA-C ? ?COVID-19 Vaccine Information can be found at: [email or web address] For questions related to vaccine distribution or appointments, please email [email or web address] or call 9025466331.  ? ?

## 2022-03-05 ENCOUNTER — Telehealth: Payer: Self-pay | Admitting: Cardiovascular Disease

## 2022-03-05 NOTE — Telephone Encounter (Signed)
I called and spoke with the patient and her daughter, Sheryle Spray regarding the patient's BP reading from today. ? ?Patient recently seen in the office on 02/27/22 with Dr. Rockey Situ. ?Intake BP that day was 190/100. ?Per Dr. Donivan Scull note, the patient had not taken her medications that day.  ?By the end of her clinic visit BP was 170/95. ? ?Medication changes made at that time were: ?1) Stop metoprolol ?2) Start carvedilol 12.5 mg BID ? ? ?I inquired from the patient and her daughter if they had obtained more than today's BP reading at home. ?Per [place]: ? ?4/7 (pm)- 129/89 ?4/9 (pm)- 123/91 ?4/10 (pm)- 125/89 ?4/11 (pm)- 126/83 ?4/12 (pm)- 120/84 ?Today (2:30 pm) 128/69 (69)  ? ?The patient state she is currently feeling well.  ?No complaints of dizziness/ lightheadedness. ? ?I have advised the patient and her daughter I will forward her updated BP readings to Dr. Rockey Situ to review/ advise. ?They are aware they will receive a call back from nursing once reviewed. ?They voice understanding and are agreeable. ?

## 2022-03-05 NOTE — Telephone Encounter (Signed)
? ?  Pt c/o BP issue: STAT if pt c/o blurred vision, one-sided weakness or slurred speech ? ?1. What are your last 5 BP readings? 128/69 HR 69 @ 2:30 pm today ? ?2. Are you having any other symptoms (ex. Dizziness, headache, blurred vision, passed out)? None ? ?3. What is your BP issue? Pt's daughter Edynn Gillock, gave pt's BP today. She said, they were told by Dr. Rockey Situ to provide pt's BP reading, she said to call pt back ?

## 2022-03-06 NOTE — Telephone Encounter (Signed)
[name] Merritts, MD   ? ?Blood pressure appears well controlled, would continue to monitor  ?Thx  ?TG   ? ?Called patient. No answer. Left detailed message per DPR.  ?

## 2022-03-09 ENCOUNTER — Ambulatory Visit: Payer: Self-pay | Admitting: General Surgery

## 2022-03-09 NOTE — Telephone Encounter (Signed)
? ?  Patient Name: [name] [name]  ?DOB: Nov 29, 1965 ?MRN: [id number] ? ?Primary Cardiologist: Ida Rogue, MD ? ?Chart reviewed as part of pre-operative protocol coverage.  ?Received preop inbox message from Dr. Rockey Situ: ? ?"Now that blood pressure has improved, she is cleared for GI surgery  ?No further cardiac testing needed  ?We can send a note to the surgical team  ?Would recommend she take her blood pressure medications morning before the procedure" ? ?Will route this bundled recommendation to requesting provider via Epic fax function. Please call with questions.  ? ?I will also route this msg to our callback team to let patient know to take her BP medications the morning of surgery per Dr. Donivan Scull recommendation. ? ? ?Charlie Pitter, PA-C ?03/09/2022, 7:49 AM ? ? ?

## 2022-03-09 NOTE — Telephone Encounter (Signed)
Left detailed message on patient voicemail, per DPR, informing her of Dr Donivan Scull recommendations. Advised a call back with any questions or concerns. ?

## 2022-03-09 NOTE — Telephone Encounter (Signed)
Spoke with patient and her daughter and gave them Dr Donivan Scull recommendations. They voiced understanding.  ? ?The patients daughter wanted to know why they have not heard back from the requesting providers office to scheduled an appointment. I advised her to contact their office. She voiced understanding. ?

## 2022-03-17 ENCOUNTER — Encounter
Admission: RE | Admit: 2022-03-17 | Discharge: 2022-03-17 | Disposition: A | Discharge status: Home / Self Care | Payer: Self-pay | Source: Ambulatory Visit | Attending: General Surgery | Admitting: General Surgery

## 2022-03-17 ENCOUNTER — Other Ambulatory Visit: Payer: Self-pay

## 2022-03-17 NOTE — Patient Instructions (Addendum)
Your procedure is scheduled on: 03/23/22 Report to [facility]. To find out your arrival time please call [phone] between 1PM - 3PM on 03/20/22.  Remember: Instructions that are not followed completely may result in serious medical risk, up to and including death, or upon the discretion of your surgeon and anesthesiologist your surgery may need to be rescheduled.     _X__ 1. DIET AS INSTRUCTED PER DR CINTRON DIAZ  __X__2.  On the morning of surgery brush your teeth with toothpaste and water, you                 may rinse your mouth with mouthwash if you wish.  Do not swallow any              toothpaste of mouthwash.     _X__ 3.  No Alcohol for 24 hours before or after surgery.   _X__ 4.  Do Not Smoke or use e-cigarettes For 24 Hours Prior to Your Surgery.                 Do not use any chewable tobacco products for at least 6 hours prior to                 surgery.  ____  5.  Bring all medications with you on the day of surgery if instructed.   __X__  6.  Notify your doctor if there is any change in your medical condition      (cold, fever, infections).     Do not wear jewelry, make-up, hairpins, clips or nail polish. Do not wear lotions, powders, or perfumes.  Do not shave body hair 48 hours prior to surgery. Men may shave face and neck. Do not bring valuables to the hospital.    [facility] is not responsible for any belongings or valuables.  Contacts, dentures/partials or body piercings may not be worn into surgery. Bring a case for your contacts, glasses or hearing aids, a denture cup will be supplied. Leave your suitcase in the car. After surgery it may be brought to your room. For patients admitted to the hospital, discharge time is determined by your treatment team.   Patients discharged the day of surgery will not be allowed to drive home.   Please read over the following fact sheets that you were given:     __X__  Take these medicines the morning of surgery with A SIP OF WATER:    1. amLODipine (NORVASC) 10 MG tablet  2. atorvastatin (LIPITOR) 80 MG tablet  3. carvedilol (COREG) 12.5 MG tablet  4.  5.  6.  ____ Fleet Enema (as directed)   __X__ Use CHG Soap/SAGE wipes as directed  MAY USE DIAL GOLD BAR SOAP. SHOWER EVERY DAY WITH DIAL INCLUDING THE DAY OF SURGERY  ____ Use inhalers on the day of surgery  ____ Stop metformin/Janumet/Farxiga 2 days prior to surgery    ____ Take 1/2 of usual insulin dose the night before surgery. No insulin the morning          of surgery.   ____ Stop Blood Thinners Coumadin/Plavix/Xarelto/Pleta/Pradaxa/Eliquis/Effient/Aspirin  on   Or contact your Surgeon, Cardiologist or Medical Doctor regarding  ability to stop your blood thinners  __X__ Stop Anti-inflammatories 7 days before surgery such as Advil, Ibuprofen, Motrin,  BC or Goodies Powder, Naprosyn, Naproxen, Aleve, Aspirin    __X__ Stop all herbals and supplements, fish oil or vitamins for  7 days until after surgery.    ____ Bring C-Pap to the hospital.

## 2022-03-18 ENCOUNTER — Encounter: Payer: Self-pay | Admitting: General Surgery

## 2022-03-18 ENCOUNTER — Telehealth: Payer: Self-pay | Admitting: *Deleted

## 2022-03-18 NOTE — Progress Notes (Addendum)
?Perioperative Services ? ?Pre-Admission/Anesthesia Testing Clinical Review ? ?Date: 03/19/22 ? ?Patient Demographics:  ?Name: [name] [name] ?DOB:   1966/01/26 ?MRN:   [id number] ? ?Planned Surgical Procedure(s):  ? ? Case: [id number] Date/Time: 03/23/22 1015  ? Procedure: XI ROBOT ASSISTED SIGMOID COLECTOMY (Abdomen)  ? Anesthesia type: General  ? 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 Location: [facility] OR ROOM 04 / [facility] ORS FOR ANESTHESIA [facility]  ? Surgeons: Herbert Pun, MD  ? ?NOTE: Available PAT nursing documentation and vital signs have been reviewed. Clinical nursing staff has updated patient's PMH/PSHx, current medication list, and drug allergies/intolerances to ensure comprehensive history available to assist in medical decision making as it pertains to the aforementioned surgical procedure and anticipated anesthetic course. Extensive review of available clinical information performed. [place] PMH and PSHx updated with any diagnoses/procedures that  may have been inadvertently omitted during her intake with the pre-admission testing department's nursing staff. ? ?Clinical Discussion:  ?[name] [name] is a 56 y.o. female who is submitted for pre-surgical anesthesia review and clearance prior to her undergoing the above procedure. Patient is a Former Smoker (quit 11/1999). Pertinent PMH includes: Ventricular fibrillation arrest, HTN, HLD, T2DM, anoxic brain injury, cognitive communication deficit, seizures, respiratory failure, multiple lung nodules on CT, IDA, colonic mass. ? ?Patient with significant medical history as noted above.  On December 02, 2020, patient was reported to be failed in the floor "gurgling" and apneic by her husband; downtime unknown.  911 was called and CPR was started but patient's husband.  When EMS arrived, patient found to be in V-fib arrest.  Stable cardiac rhythm established following 2 defibrillations and epinephrine administration.  Patient was  transported to [facility]) where she was found to be severely hypokalemic and in DKA; K+ 2.1, CO2 20, lactic acid 10, and anion gap 21.  Patient required and extended 5-week admission to the hospital for anoxic encephalopathy, during which time she required tracheostomy and PEG tube placement.  Cardiology was consulted during her admission.  Patient has continued routine follow-up with cardiology service on an outpatient basis. ? ?Patient is followed by cardiology Rockey Situ, MD). She was last seen in the cardiology clinic on 02/27/2022; notes reviewed.  At the time of her clinic visit, patient doing well overall from a cardiovascular perspective.  She denied any episodes of chest pain, shortness of breath, PND, orthopnea, palpitations, significant peripheral edema, vertiginous symptoms, or presyncope/syncope.  Patient with some issues with aphasia and typing on computer, however otherwise she reported that was at her baseline.  Previous cardiovascular studies as follows: ? ?TTE performed on 12/03/2020 revealed a normal left ventricular systolic function with mild LVH; LVEF 60-65%.  RV SF normal.  There were no regional wall motion abnormalities.  Aorta was structurally normal with no evidence of dilatation.  There was trivial tricuspid valve regurgitation.  There was no evidence of a significant transvalvular gradient to suggest stenosis. ? ?Coronary CTA performed on 02/27/2021 demonstrated normal coronary origin with RIGHT-sided dominance.  There was no evidence of CAD.  Coronary calcium score 0.  Study interpreted as CAD-RADS 0, indicating the patient at a low risk for coronary events. ? ?Blood pressure and found to be significantly elevated at 190/100 in clinic despite prescribed CCB and beta-blocker therapies.  Of note, patient advised cardiologist that she did not take her antihypertensive medications prior to her visit.  Patient is on a statin for her HLD and ASCVD  prevention.  T2DM well-controlled at this point with diet lifestyle modifications alone; last HgbA1c was 6.4% when checked on 10/27/2021.  Patient making efforts to remain active.  She reports that she is walking a lot and is occupying her time pursuing personal hobbies. Functional capacity, as defined by DASI, is documented as being >/= 4 METS.  Given blood pressure elevation, metoprolol was discontinued and replaced with carvedilol.  No other changes were made to her medication regimen.  Patient follow-up with outpatient cardiology in 6 months or sooner if needed. ? ?[name] [name] is scheduled for a ROBOT ASSISTED SIGMOID COLECTOMY on 03/23/2022 with Dr. Herbert Pun, MD. Given patient's past medical history significant for cardiovascular diagnoses, presurgical cardiac clearance was sought by the performing surgeon's office and PAT team. Cardiology wished to hold on clearing patient initially to allow for better optimization of her blood pressures. In speaking with the patient, her reading have significantly improved at home. Patient contacted by cardiology to discuss on 03/19/2022; see notes. Following conversation with the patient, per cardiology, "the patient affirms she has been doing well without any new cardiac symptoms. They are able to achieve > 4 METS without cardiac limitations. Her blood pressure readings have been well controlled at home in the 326Z systolic. Therefore, based on ACC/AHA guidelines, the patient would be at an ACCEPTABLE risk for the planned procedure without further cardiovascular testing".  In review of her medication reconciliation, it is noted the patient is not on any type of anticoagulation or platelet therapies that would need to be held during the perioperative course. ? ?Patient denies previous perioperative complications with anesthesia in the past. In review of the available records, it is noted that patient underwent a general anesthetic course here at [facility]) (ASA III) in 01/2022 without documented complications.  ? ? ?  03/19/2022  ? 12:25 PM 03/17/2022  ?  3:18 PM 02/27/2022  ?  9:04 AM  ?Vitals with BMI  ?Height  '5\' 1"'$  '5\' 1"'$   ?Weight  204 lbs 210 lbs 6 oz  ?BMI  38.57 39.77  ?Systolic 124  580  ?Diastolic 89  998  ?Pulse 76  71  ? ? ?Providers/Specialists:  ? ?NOTE: Primary physician provider listed below. Patient may have been seen by APP or partner within same practice.  ? ?PROVIDER ROLE / SPECIALTY LAST OV  ?Herbert Pun, MD General Surgery (Surgeon) 02/17/2022  ?Marinda Elk, MD Primary Care Provider 10/27/2021  ?Ida Rogue, MD Cardiology 02/27/2022  ? ?Allergies:  ?Patient has no known allergies. ? ?Current Home Medications:  ? ?No current facility-administered medications for this encounter.  ? ? atorvastatin (LIPITOR) 80 MG tablet  ? carvedilol (COREG) 12.5 MG tablet  ? potassium chloride SA (KLOR-CON M20) 20 MEQ tablet  ? amLODipine (NORVASC) 10 MG tablet  ? saccharomyces boulardii (FLORASTOR) 250 MG capsule  ? ?History:  ? ?Past Medical History:  ?Diagnosis Date  ? Anemia   ? Anoxic brain injury ([place]) 11/2020  ? a.) following V-fib cardiac arrest. b.) Extended hospital admission x 5 weeks for anoxic encephalopathy requiring tracheostomy and PEG tube placement  ? Cardiac arrest with ventricular fibrillation ([place]) 12/02/2020  ? a.) found down at home "gurgling" and apneic; downtime unknown.  911 called and CPR was started.  EMS arrived and patient found to be in ventricular fibrillation; defibrillated x2 and epinephrine given.  Severe hypokalemia and DKA; K+ 2.1, CO2 20, lactic acid 10, gap 21.  ? Cognitive communication deficit   ?  Colonic mass 02/06/2022  ? 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  ? History of 2019 novel coronavirus disease (COVID-19) 06/2021  ? History of respiratory failure 12/02/2020  ? Hyperlipidemia   ? Hypertension   ? Hypokalemia   ? a.) avoid  diuretic medications per cardiology  ? IDA (iron deficiency anemia)   ? Internal hemorrhoids   ? Multiple lung nodules on CT   ? a.) followed by [facility] lung cancer screening team  ? Seizures ([place])   ? T2DM (type 2

## 2022-03-18 NOTE — Telephone Encounter (Signed)
Preoperative team, please contact this patient and set up a phone call appointment for further preoperative risk assessment. Please obtain consent and complete medication review. Thank you for your help.  ? ?Please have patient with BP readings.  ?

## 2022-03-18 NOTE — Telephone Encounter (Signed)
I tried to reach the pt though call would not go through. I also tried number on file for DPR though this number would not go through as well. I will send FYI to requesting office in hopes that they may s/w the pt and let her know to call her cardiologist office for an appt,  ?

## 2022-03-18 NOTE — Telephone Encounter (Signed)
-----   Message from Karen Kitchens, NP sent at 03/18/2022  9:57 AM EDT ----- ?Regarding: Request for pre-operative cardiac clearance ?Request for pre-operative cardiac clearance: ? ?NOTE: Patient seen by Dr. Rockey Situ on 02/27/2022.  Blood pressure markedly elevated at that time, therefore preoperative clearance not officially issued.  In review of the documentation, it appears that patient's blood pressures are much better controlled at this time.  Given improvement, seeking appropriate clearance for patient to proceed at this point.   ? ?1. What type of surgery is being performed?  ?ROBOT ASSISTED SIGMOID COLECTOMY ? ?2. When is this surgery scheduled?  ?03/23/2022 ?? ?3. Type of clearance being requested (medical, pharmacy, both). ?MEDICAL ?  ?4. Are there any medications that need to be held prior to surgery? ?NONE ? ?5. Practice name and name of physician performing surgery?  ?Performing surgeon: Dr. Herbert Pun, MD ?Requesting clearance: Honor Loh, FNP-C   ?? ?6. Anesthesia type (none, local, MAC, general)? ?GENERAL ? ?7. What is the office phone and fax number?   ?Phone: [phone] ?Fax: [phone] ? ?ATTENTION: Unable to create telephone message as per your standard workflow. Directed by [facility] providers to send requests for cardiac clearance to this pool for appropriate distribution to provider covering pre-operative clearances.  ? ?Honor Loh, MSN, APRN, FNP-C, CEN ?[place]  ?Peri-operative Services Nurse Practitioner ?Phone: [phone] ?03/18/22 9:57 AM ? ?

## 2022-03-19 ENCOUNTER — Telehealth: Payer: Self-pay | Admitting: *Deleted

## 2022-03-19 ENCOUNTER — Ambulatory Visit (INDEPENDENT_AMBULATORY_CARE_PROVIDER_SITE_OTHER): Payer: Self-pay | Admitting: Physician Assistant

## 2022-03-19 VITALS — BP 125/89 | HR 76

## 2022-03-19 DIAGNOSIS — Z01810 Encounter for preprocedural cardiovascular examination: Secondary | ICD-10-CM

## 2022-03-19 NOTE — Progress Notes (Signed)
? ?Virtual Visit via Telephone Note  ? ?This visit type was conducted due to national recommendations for restrictions regarding the COVID-19 Pandemic (e.g. social distancing) in an effort to limit this patient's exposure and mitigate transmission in our community.  Due to her co-morbid illnesses, this patient is at least at moderate risk for complications without adequate follow up.  This format is felt to be most appropriate for this patient at this time.  The patient did not have access to video technology/had technical difficulties with video requiring transitioning to audio format only (telephone).  All issues noted in this document were discussed and addressed.  No physical exam could be performed with this format.  Please refer to the patient's chart for her  consent to telehealth for [facility]. ? ?Evaluation Performed:  Preoperative cardiovascular risk assessment ?_____________  ? ?Date:  03/19/2022  ? ?Patient ID:  [name] [name], DOB May 14, 1966, MRN [id number] ?Patient Location:  ?Home ?Provider location:   ?Office ? ?Primary Care Provider:  Marinda Elk, MD ?Primary Cardiologist:  Ida Rogue, MD ? ?Chief Complaint  ?  ?56 y.o. y/o female with a h/o VF arrest in the setting of hypokalemia in 12/7515 complicated by anoxic brain injury with good recovery and DKA with severe acidosis, nonobstructive CAD by coronary CTA in 02/2021, DM2, HTN, and HLD who is pending robotic assisted colectomy on 03/23/2022, and presents today for telephonic preoperative cardiovascular risk assessment. ? ?Past Medical History  ?  ?Past Medical History:  ?Diagnosis Date  ? Anemia   ? Anoxic brain injury ([place]) 11/2020  ? a.) following V-fib cardiac arrest. b.) Extended hospital admission x 5 weeks for anoxic encephalopathy requiring tracheostomy and PEG tube placement  ? Cardiac arrest with ventricular fibrillation ([place]) 12/02/2020  ? a.) found down at home "gurgling" and apneic; downtime unknown.  911 called and CPR  was started.  EMS arrived and patient found to be in ventricular fibrillation; defibrillated x2 and epinephrine given.  Severe hypokalemia and DKA; K+ 2.1, CO2 20, lactic acid 10, gap 21.  ? Cognitive communication deficit   ? Colonic mass 02/06/2022  ? 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  ? History of 2019 novel coronavirus disease (COVID-19) 06/2021  ? History of respiratory failure 12/02/2020  ? Hyperlipidemia   ? Hypertension   ? Hypokalemia   ? a.) avoid diuretic medications per cardiology  ? IDA (iron deficiency anemia)   ? Internal hemorrhoids   ? Multiple lung nodules on CT   ? a.) followed by [facility] lung cancer screening team  ? Seizures ([place])   ? T2DM (type 2 diabetes mellitus) ([place])   ? Tubular adenoma of colon 02/06/2022  ? a.) Bx (-) for high-grade dysplasia or malignancy  ? Uterine leiomyoma   ? ?Past Surgical History:  ?Procedure Laterality Date  ? COLONOSCOPY WITH PROPOFOL N/A 02/06/2022  ? Procedure: COLONOSCOPY WITH PROPOFOL;  Surgeon: Lesly Rubenstein, MD;  Location: [facility] ENDOSCOPY;  Service: Endoscopy;  Laterality: N/A;  ? PEG PLACEMENT N/A 12/13/2020  ? Procedure: PERCUTANEOUS ENDOSCOPIC GASTROSTOMY (PEG) PLACEMENT;  Surgeon: Lesly Rubenstein, MD;  Location: [facility] ENDOSCOPY;  Service: Endoscopy;  Laterality: N/A;  ? TRACHEOSTOMY TUBE PLACEMENT N/A 12/13/2020  ? Procedure: TRACHEOSTOMY;  Surgeon: Clyde Canterbury, MD;  Location: [facility] ORS;  Service: ENT;  Laterality: N/A;  ? ? ?Allergies ? ?No Known Allergies ? ?History of Present Illness  ?  ?[name] [name] is a 56 y.o. female who presents via audio/video  conferencing for a telehealth visit today.  Pt was last seen in cardiology clinic on 02/27/2022 by Dr. Rockey Situ for preoperative cardiac risk stratification for robotic assisted colectomy.  At that time [name] [name] was doing well, and was without angina or decompensation.  She was noted to have elevated BP.  She had not yet taken her morning medications at that  time.  Metoprolol was transitioned to Coreg.  Since her last visit, she has done well and has been without symptoms of angina or decompensation. Her blood pressure readings at home have been well controlled in the 56 systolic. She is able to achieve > 4 METs without cardiac limitation.  ? ? ?Home Medications  ?  ?Prior to Admission medications   ?Medication Sig Start Date End Date Taking? Authorizing Provider  ?amLODipine (NORVASC) 10 MG tablet Take 10 mg by mouth daily.   Yes [provider]  ?carvedilol (COREG) 12.5 MG tablet Take 1 tablet (12.5 mg total) by mouth 2 (two) times daily. 02/27/22 05/28/22 Yes Gollan, Kathlene November, MD  ?atorvastatin (LIPITOR) 80 MG tablet Take 1 tablet (80 mg total) by mouth daily. ?Patient taking differently: Take 80 mg by mouth 2 (two) times daily. 02/28/21 03/17/22  Loel Dubonnet, NP  ?potassium chloride SA (KLOR-CON M20) 20 MEQ tablet Take 2 tablets (40 mEq total) by mouth daily. ?Patient taking differently: Take 40 mEq by mouth 2 (two) times daily. 02/28/21 03/17/22  Loel Dubonnet, NP  ?saccharomyces boulardii (FLORASTOR) 250 MG capsule Take 1 capsule (250 mg total) by mouth 2 (two) times daily. ?Patient not taking: Reported on 03/16/2022 02/07/21   Bary Leriche, PA-C  ? ? ?Physical Exam  ?  ?Vital Signs:  BP: 125/89, Pulse: 76 bpm ? ?Given telephonic nature of communication, physical exam is limited. ?AAOx3. NAD. Normal affect.  Speech and respirations are unlabored. ? ?Accessory Clinical Findings  ?  ?None ? ?Assessment & Plan  ?  ?1.  Preoperative Cardiovascular Risk Assessment: The patient affirms she has been doing well without any new cardiac symptoms. They are able to achieve > 4 METS without cardiac limitations. Her blood pressure readings have been well controlled at home in the 56 systolic.  Therefore, based on ACC/AHA guidelines, the patient would be at acceptable risk for the planned procedure without further cardiovascular testing. The patient was advised  that if she develops new symptoms prior to surgery to contact our office to arrange for a follow-up visit, and she verbalized understanding. ? ? ? ?A copy of this note will be routed to requesting surgeon. ? ?Time:   ?Today, I have spent 6 minutes with the patient with telehealth technology discussing medical history, symptoms, and management plan.   ? ? ?Christell Faith, PA-C ? ?03/19/2022, 12:40 PM ? ?

## 2022-03-19 NOTE — Telephone Encounter (Signed)
?  Patient Consent for Virtual Visit  ?Pt agreeable to tele appt today ? ?   ? ?[name] has provided verbal consent on 03/19/2022 for a virtual visit (video or telephone). ? ? ?CONSENT FOR VIRTUAL VISIT FOR:  [name] [name]  ?By participating in this virtual visit I agree to the following: ? ?I hereby voluntarily request, consent and authorize [place] and its employed or contracted physicians, physician assistants, nurse practitioners or other licensed health care professionals (the Practitioner), to provide me with telemedicine health care services (the ?Services") as deemed necessary by the treating Practitioner. I acknowledge and consent to receive the Services by the Practitioner via telemedicine. I understand that the telemedicine visit will involve communicating with the Practitioner through live audiovisual communication technology and the disclosure of certain medical information by electronic transmission. I acknowledge that I have been given the opportunity to request an in-person assessment or other available alternative prior to the telemedicine visit and am voluntarily participating in the telemedicine visit. ? ?I understand that I have the right to withhold or withdraw my consent to the use of telemedicine in the course of my care at any time, without affecting my right to future care or treatment, and that the Practitioner or I may terminate the telemedicine visit at any time. I understand that I have the right to inspect all information obtained and/or recorded in the course of the telemedicine visit and may receive copies of available information for a reasonable fee.  I understand that some of the potential risks of receiving the Services via telemedicine include:  ?Delay or interruption in medical evaluation due to technological equipment failure or disruption; ?Information transmitted may not be sufficient (e.g. poor resolution of images) to allow for appropriate medical decision  making by the Practitioner; and/or  ?In rare instances, security protocols could fail, causing a breach of personal health information. ? ?Furthermore, I acknowledge that it is my responsibility to provide information about my medical history, conditions and care that is complete and accurate to the best of my ability. I acknowledge that Practitioner's advice, recommendations, and/or decision may be based on factors not within their control, such as incomplete or inaccurate data provided by me or distortions of diagnostic images or specimens that may result from electronic transmissions. I understand that the practice of medicine is not an exact science and that Practitioner makes no warranties or guarantees regarding treatment outcomes. I acknowledge that a copy of this consent can be made available to me via my patient portal ([place]), or I can request a printed copy by calling the office of [place].   ? ?I understand that my insurance will be billed for this visit.  ? ?I have read or had this consent read to me. ?I understand the contents of this consent, which adequately explains the benefits and risks of the Services being provided via telemedicine.  ?I have been provided ample opportunity to ask questions regarding this consent and the Services and have had my questions answered to my satisfaction. ?I give my informed consent for the services to be provided through the use of telemedicine in my medical care ? ? ? ?

## 2022-03-19 NOTE — Telephone Encounter (Signed)
Consent has been done.  ? ?

## 2022-03-19 NOTE — Telephone Encounter (Addendum)
Patient returned call, she said she can be reached at [phone] ?

## 2022-03-19 NOTE — Progress Notes (Signed)
?  Perioperative Services ?Pre-Admission/Anesthesia Testing ? ?  ?Date: 03/19/22 ? ?Name: [name] [name] ?MRN:   [id number] ? ?Re: Cardiology clearance for sigmoid colectomy ? ?Patient is scheduled for removal sigmoid colectomy on 03/23/2022 with Dr. Herbert Pun, MD. Of note, patient seen recently by primary cardiologist Rockey Situ, MD, at which time her blood pressure was elevated.  Advised patient that clearance for surgery will require better optimization of her blood pressure.  Patient with medication changes during last cardiology visit.  There is documentation and [facility] noting improved blood pressure readings, however there is no official clearance from cardiology.   ? ?Patient was submitted through cardiology APP pool for official clearance.  Cardiology provider wished to speak with patient to review blood pressure readings and determine if she is having any symptoms associated with the previous medication changes.  Patient unable to be contacted.  Cardiology provider asked PAT to attempt to contact.  Several attempts were made to contact patient, however we were unsuccessful.  We were able to contact patient's aunt and number to the cardiology preoperative team was provided.  Aunt advising that it is better to "text" patient as she is difficult to contact.  No way to securely text patient  at this point from our office.  Patient is on provided cardiology clinic number and asked to have her niece contact them ASAP for official clearance for her procedure coming up on 03/23/2022; verbalized understanding. ? ?As of 1130 AM on 03/19/2022, patient has not returned call to cardiology. Will have front office staff attempt to contact her again and advise her that it is imperative that she speak with cardiology team in efforts to avoid having her case postponed pending official clearance from her cardiology team.   ? ?Honor Loh, MSN, APRN, FNP-C, CEN ?[place]  ?Peri-operative Services Nurse  Practitioner ?Phone: [phone] ?03/19/22 11:22 AM ? ?NOTE: This note has been prepared using [employer]. Despite my best ability to proofread, there is always the potential that unintentional transcriptional errors may still occur from this process. ? ?

## 2022-03-22 MED ORDER — BUPIVACAINE LIPOSOME 1.3 % IJ SUSP: 20.0000 mL | Freq: Once | INTRAMUSCULAR | Status: DC

## 2022-03-22 MED ORDER — HEPARIN SODIUM (PORCINE) 5000 UNIT/ML IJ SOLN: 5000.0000 [IU] | Freq: Once | INTRAMUSCULAR | Status: AC

## 2022-03-22 MED ORDER — ALVIMOPAN 12 MG PO CAPS: 12.0000 mg | ORAL_CAPSULE | ORAL | Status: AC

## 2022-03-22 MED ORDER — CHLORHEXIDINE GLUCONATE 0.12 % MT SOLN: 15.0000 mL | Freq: Once | OROMUCOSAL | Status: AC

## 2022-03-22 MED ORDER — GABAPENTIN 300 MG PO CAPS: 300.0000 mg | ORAL_CAPSULE | ORAL | Status: AC

## 2022-03-22 MED ORDER — ACETAMINOPHEN 500 MG PO TABS: 1000.0000 mg | ORAL_TABLET | ORAL | Status: AC

## 2022-03-22 MED ORDER — CELECOXIB 200 MG PO CAPS: 200.0000 mg | ORAL_CAPSULE | ORAL | Status: AC

## 2022-03-22 MED ORDER — LACTATED RINGERS IV SOLN: INTRAVENOUS | Status: DC

## 2022-03-22 MED ORDER — FAMOTIDINE 20 MG PO TABS: 20.0000 mg | ORAL_TABLET | Freq: Once | ORAL | Status: AC

## 2022-03-22 MED ORDER — SODIUM CHLORIDE 0.9 % IV SOLN
2.0000 g | INTRAVENOUS | Status: AC
  Administered 2022-03-23: 2 g via INTRAVENOUS

## 2022-03-22 MED ORDER — ORAL CARE MOUTH RINSE: 15.0000 mL | Freq: Once | OROMUCOSAL | Status: AC

## 2022-03-23 ENCOUNTER — Other Ambulatory Visit: Payer: Self-pay

## 2022-03-23 ENCOUNTER — Inpatient Hospital Stay: Payer: Self-pay | Admitting: Urgent Care

## 2022-03-23 ENCOUNTER — Encounter: Payer: Self-pay | Admitting: General Surgery

## 2022-03-23 ENCOUNTER — Encounter
Admission: RE | Disposition: A | Discharge status: Home / Self Care | Payer: Self-pay | Source: Home / Self Care | Attending: General Surgery

## 2022-03-23 ENCOUNTER — Inpatient Hospital Stay
Admission: RE | Admit: 2022-03-23 | Discharge: 2022-03-25 | DRG: 331 | Disposition: A | Discharge status: Home / Self Care | Payer: Self-pay | Attending: General Surgery | Admitting: General Surgery

## 2022-03-23 DIAGNOSIS — Z800 Family history of malignant neoplasm of digestive organs: Secondary | ICD-10-CM

## 2022-03-23 DIAGNOSIS — Z8674 Personal history of sudden cardiac arrest: Secondary | ICD-10-CM

## 2022-03-23 DIAGNOSIS — Z8249 Family history of ischemic heart disease and other diseases of the circulatory system: Secondary | ICD-10-CM

## 2022-03-23 DIAGNOSIS — K429 Umbilical hernia without obstruction or gangrene: Secondary | ICD-10-CM | POA: Diagnosis present

## 2022-03-23 DIAGNOSIS — I10 Essential (primary) hypertension: Secondary | ICD-10-CM | POA: Diagnosis present

## 2022-03-23 DIAGNOSIS — E785 Hyperlipidemia, unspecified: Secondary | ICD-10-CM | POA: Diagnosis present

## 2022-03-23 DIAGNOSIS — N736 Female pelvic peritoneal adhesions (postinfective): Secondary | ICD-10-CM | POA: Diagnosis present

## 2022-03-23 DIAGNOSIS — D126 Benign neoplasm of colon, unspecified: Principal | ICD-10-CM | POA: Diagnosis present

## 2022-03-23 DIAGNOSIS — Z8616 Personal history of COVID-19: Secondary | ICD-10-CM

## 2022-03-23 DIAGNOSIS — Z87891 Personal history of nicotine dependence: Secondary | ICD-10-CM

## 2022-03-23 DIAGNOSIS — E119 Type 2 diabetes mellitus without complications: Secondary | ICD-10-CM | POA: Diagnosis present

## 2022-03-23 DIAGNOSIS — Z833 Family history of diabetes mellitus: Secondary | ICD-10-CM

## 2022-03-23 DIAGNOSIS — D124 Benign neoplasm of descending colon: Principal | ICD-10-CM | POA: Diagnosis present

## 2022-03-23 DIAGNOSIS — D259 Leiomyoma of uterus, unspecified: Secondary | ICD-10-CM | POA: Diagnosis present

## 2022-03-23 DIAGNOSIS — Z806 Family history of leukemia: Secondary | ICD-10-CM

## 2022-03-23 HISTORY — PX: OTHER SURGICAL HISTORY: SHX169

## 2022-03-23 HISTORY — DX: Leiomyoma of uterus, unspecified: D25.9

## 2022-03-23 HISTORY — DX: Other nonspecific abnormal finding of lung field: R91.8

## 2022-03-23 HISTORY — DX: Anemia, unspecified: D64.9

## 2022-03-23 HISTORY — DX: Iron deficiency anemia, unspecified: D50.9

## 2022-03-23 HISTORY — PX: UMBILICAL HERNIA REPAIR: SHX196

## 2022-03-23 HISTORY — DX: Other hemorrhoids: K64.8

## 2022-03-23 HISTORY — DX: Hypokalemia: E87.6

## 2022-03-23 HISTORY — DX: Type 2 diabetes mellitus without complications: E11.9

## 2022-03-23 LAB — POCT I-STAT, CHEM 8
BUN: 8 mg/dL (ref 6–20)
Calcium, Ion: 1.2 mmol/L (ref 1.15–1.40)
Chloride: 103 mmol/L (ref 98–111)
Creatinine, Ser: 0.7 mg/dL (ref 0.44–1.00)
Glucose, Bld: 118 mg/dL — ABNORMAL HIGH (ref 70–99)
HCT: 46 % (ref 36.0–46.0)
Hemoglobin: 15.6 g/dL — ABNORMAL HIGH (ref 12.0–15.0)
Potassium: 3.5 mmol/L (ref 3.5–5.1)
Sodium: 143 mmol/L (ref 135–145)
TCO2: 28 mmol/L (ref 22–32)

## 2022-03-23 LAB — GLUCOSE, CAPILLARY: Glucose-Capillary: 144 mg/dL — ABNORMAL HIGH (ref 70–99)

## 2022-03-23 LAB — POCT PREGNANCY, URINE: Preg Test, Ur: NEGATIVE

## 2022-03-23 SURGERY — COLECTOMY, SIGMOID, ROBOT-ASSISTED
Anesthesia: General | Site: Abdomen

## 2022-03-23 MED ORDER — GABAPENTIN 300 MG PO CAPS
ORAL_CAPSULE | ORAL | Status: AC
Start: 2022-03-23 — End: 2022-03-23
  Administered 2022-03-23: 300 mg via ORAL
  Filled 2022-03-23: qty 1

## 2022-03-23 MED ORDER — HEPARIN SODIUM (PORCINE) 5000 UNIT/ML IJ SOLN
INTRAMUSCULAR | Status: AC
  Administered 2022-03-23: 5000 [IU] via SUBCUTANEOUS
  Filled 2022-03-23: qty 1

## 2022-03-23 MED ORDER — ALVIMOPAN 12 MG PO CAPS
ORAL_CAPSULE | ORAL | Status: AC
  Administered 2022-03-23: 12 mg via ORAL
  Filled 2022-03-23: qty 1

## 2022-03-23 MED ORDER — SODIUM CHLORIDE 0.9 % IV SOLN
INTRAVENOUS | Status: AC
  Administered 2022-03-23: 2 g via INTRAVENOUS
  Filled 2022-03-23: qty 2

## 2022-03-23 MED ORDER — FENTANYL CITRATE (PF) 100 MCG/2ML IJ SOLN
25.0000 ug | INTRAMUSCULAR | Status: DC | PRN
  Administered 2022-03-23: 25 ug via INTRAVENOUS

## 2022-03-23 MED ORDER — ONDANSETRON 4 MG PO TBDP: 4.0000 mg | ORAL_TABLET | Freq: Four times a day (QID) | ORAL | Status: DC | PRN

## 2022-03-23 MED ORDER — CELECOXIB 200 MG PO CAPS
200.0000 mg | ORAL_CAPSULE | Freq: Two times a day (BID) | ORAL | Status: DC
  Administered 2022-03-23 – 2022-03-25 (×4): 200 mg via ORAL
  Filled 2022-03-23 (×4): qty 1

## 2022-03-23 MED ORDER — SACCHAROMYCES BOULARDII 250 MG PO CAPS: 250.0000 mg | ORAL_CAPSULE | Freq: Two times a day (BID) | ORAL | Status: DC

## 2022-03-23 MED ORDER — KETAMINE HCL 50 MG/5ML IJ SOSY
PREFILLED_SYRINGE | INTRAMUSCULAR | Status: AC
Start: 2022-03-23 — End: ?
  Filled 2022-03-23: qty 5

## 2022-03-23 MED ORDER — SODIUM CHLORIDE (PF) 0.9 % IJ SOLN
INTRAMUSCULAR | Status: AC
  Filled 2022-03-23: qty 50

## 2022-03-23 MED ORDER — ACETAMINOPHEN 10 MG/ML IV SOLN
1000.0000 mg | Freq: Once | INTRAVENOUS | Status: DC | PRN
  Administered 2022-03-23: 1000 mg via INTRAVENOUS

## 2022-03-23 MED ORDER — ROCURONIUM BROMIDE 100 MG/10ML IV SOLN
INTRAVENOUS | Status: DC | PRN
Start: 2022-03-23 — End: 2022-03-23
  Administered 2022-03-23: 20 mg via INTRAVENOUS
  Administered 2022-03-23: 30 mg via INTRAVENOUS
  Administered 2022-03-23: 50 mg via INTRAVENOUS

## 2022-03-23 MED ORDER — SUGAMMADEX SODIUM 200 MG/2ML IV SOLN
INTRAVENOUS | Status: DC | PRN
  Administered 2022-03-23: 200 mg via INTRAVENOUS

## 2022-03-23 MED ORDER — SODIUM CHLORIDE 0.9 % IV SOLN: INTRAVENOUS | Status: DC

## 2022-03-23 MED ORDER — HYDROCODONE-ACETAMINOPHEN 5-325 MG PO TABS
1.0000 | ORAL_TABLET | ORAL | Status: DC | PRN
  Administered 2022-03-24: 1 via ORAL
  Filled 2022-03-23: qty 1

## 2022-03-23 MED ORDER — FENTANYL CITRATE (PF) 100 MCG/2ML IJ SOLN
INTRAMUSCULAR | Status: AC
  Filled 2022-03-23: qty 2

## 2022-03-23 MED ORDER — MIDAZOLAM HCL 2 MG/2ML IJ SOLN
INTRAMUSCULAR | Status: AC
  Filled 2022-03-23: qty 2

## 2022-03-23 MED ORDER — ACETAMINOPHEN 10 MG/ML IV SOLN
INTRAVENOUS | Status: AC
  Filled 2022-03-23: qty 100

## 2022-03-23 MED ORDER — PROPOFOL 10 MG/ML IV BOLUS
INTRAVENOUS | Status: AC
  Filled 2022-03-23: qty 20

## 2022-03-23 MED ORDER — ENOXAPARIN SODIUM 40 MG/0.4ML IJ SOSY
40.0000 mg | PREFILLED_SYRINGE | INTRAMUSCULAR | Status: DC
  Administered 2022-03-24 – 2022-03-25 (×2): 40 mg via SUBCUTANEOUS
  Filled 2022-03-23 (×2): qty 0.4

## 2022-03-23 MED ORDER — POTASSIUM CHLORIDE CRYS ER 20 MEQ PO TBCR
40.0000 meq | EXTENDED_RELEASE_TABLET | Freq: Two times a day (BID) | ORAL | Status: DC
  Administered 2022-03-23 – 2022-03-25 (×4): 40 meq via ORAL
  Filled 2022-03-23 (×4): qty 2

## 2022-03-23 MED ORDER — GLYCOPYRROLATE 0.2 MG/ML IJ SOLN
INTRAMUSCULAR | Status: DC | PRN
Start: 2022-03-23 — End: 2022-03-23
  Administered 2022-03-23 (×2): .1 mg via INTRAVENOUS

## 2022-03-23 MED ORDER — PANTOPRAZOLE SODIUM 40 MG IV SOLR
40.0000 mg | Freq: Every day | INTRAVENOUS | Status: DC
  Administered 2022-03-23 – 2022-03-24 (×2): 40 mg via INTRAVENOUS
  Filled 2022-03-23 (×2): qty 10

## 2022-03-23 MED ORDER — GABAPENTIN 300 MG PO CAPS
300.0000 mg | ORAL_CAPSULE | Freq: Two times a day (BID) | ORAL | Status: DC
  Administered 2022-03-23 – 2022-03-25 (×4): 300 mg via ORAL
  Filled 2022-03-23 (×4): qty 1

## 2022-03-23 MED ORDER — MORPHINE SULFATE (PF) 4 MG/ML IV SOLN: 4.0000 mg | INTRAVENOUS | Status: DC | PRN

## 2022-03-23 MED ORDER — BUPIVACAINE LIPOSOME 1.3 % IJ SUSP
INTRAMUSCULAR | Status: AC
  Filled 2022-03-23: qty 20

## 2022-03-23 MED ORDER — CHLORHEXIDINE GLUCONATE 0.12 % MT SOLN
OROMUCOSAL | Status: AC
  Administered 2022-03-23: 15 mL via OROMUCOSAL
  Filled 2022-03-23: qty 15

## 2022-03-23 MED ORDER — DROPERIDOL 2.5 MG/ML IJ SOLN: 0.6250 mg | Freq: Once | INTRAMUSCULAR | Status: DC | PRN

## 2022-03-23 MED ORDER — SODIUM CHLORIDE (PF) 0.9 % IJ SOLN
INTRAMUSCULAR | Status: DC | PRN
  Administered 2022-03-23: 100 mL

## 2022-03-23 MED ORDER — OXYCODONE HCL 5 MG/5ML PO SOLN: 5.0000 mg | Freq: Once | ORAL | Status: DC | PRN

## 2022-03-23 MED ORDER — SODIUM CHLORIDE 0.9 % IV SOLN
2.0000 g | Freq: Two times a day (BID) | INTRAVENOUS | Status: AC
  Filled 2022-03-23: qty 2

## 2022-03-23 MED ORDER — ONDANSETRON HCL 4 MG/2ML IJ SOLN
INTRAMUSCULAR | Status: DC | PRN
  Administered 2022-03-23: 4 mg via INTRAVENOUS

## 2022-03-23 MED ORDER — DEXAMETHASONE SODIUM PHOSPHATE 10 MG/ML IJ SOLN
INTRAMUSCULAR | Status: AC
  Filled 2022-03-23: qty 1

## 2022-03-23 MED ORDER — FAMOTIDINE 20 MG PO TABS
ORAL_TABLET | ORAL | Status: AC
Start: 2022-03-23 — End: 2022-03-23
  Administered 2022-03-23: 20 mg via ORAL
  Filled 2022-03-23: qty 1

## 2022-03-23 MED ORDER — BUPIVACAINE HCL (PF) 0.5 % IJ SOLN
INTRAMUSCULAR | Status: AC
  Filled 2022-03-23: qty 30

## 2022-03-23 MED ORDER — DEXAMETHASONE SODIUM PHOSPHATE 10 MG/ML IJ SOLN
INTRAMUSCULAR | Status: DC | PRN
  Administered 2022-03-23: 10 mg via INTRAVENOUS

## 2022-03-23 MED ORDER — AMLODIPINE BESYLATE 10 MG PO TABS
10.0000 mg | ORAL_TABLET | Freq: Every day | ORAL | Status: DC
Start: 2022-03-24 — End: 2022-03-25
  Administered 2022-03-24 – 2022-03-25 (×2): 10 mg via ORAL
  Filled 2022-03-23 (×2): qty 1

## 2022-03-23 MED ORDER — INDOCYANINE GREEN 25 MG IV SOLR
INTRAVENOUS | Status: DC | PRN
  Administered 2022-03-23 (×2): 7.5 mg via INTRAVENOUS

## 2022-03-23 MED ORDER — POLYETHYLENE GLYCOL 3350 17 G PO PACK
17.0000 g | PACK | Freq: Every day | ORAL | Status: DC
  Administered 2022-03-23: 17 g via ORAL
  Filled 2022-03-23: qty 1

## 2022-03-23 MED ORDER — LIDOCAINE HCL (CARDIAC) PF 100 MG/5ML IV SOSY
PREFILLED_SYRINGE | INTRAVENOUS | Status: DC | PRN
  Administered 2022-03-23: 100 mg via INTRAVENOUS

## 2022-03-23 MED ORDER — DEXMEDETOMIDINE HCL IN NACL 400 MCG/100ML IV SOLN
INTRAVENOUS | Status: DC | PRN
  Administered 2022-03-23: 8 ug via INTRAVENOUS
  Administered 2022-03-23: 12 ug via INTRAVENOUS

## 2022-03-23 MED ORDER — VECURONIUM BROMIDE 10 MG IV SOLR
INTRAVENOUS | Status: DC | PRN
Start: 2022-03-23 — End: 2022-03-23
  Administered 2022-03-23: 1 mg via INTRAVENOUS

## 2022-03-23 MED ORDER — ONDANSETRON HCL 4 MG/2ML IJ SOLN: 4.0000 mg | Freq: Four times a day (QID) | INTRAMUSCULAR | Status: DC | PRN

## 2022-03-23 MED ORDER — FENTANYL CITRATE (PF) 100 MCG/2ML IJ SOLN
INTRAMUSCULAR | Status: DC | PRN
  Administered 2022-03-23 (×2): 50 ug via INTRAVENOUS

## 2022-03-23 MED ORDER — ALVIMOPAN 12 MG PO CAPS
12.0000 mg | ORAL_CAPSULE | Freq: Two times a day (BID) | ORAL | Status: DC
  Filled 2022-03-23: qty 1

## 2022-03-23 MED ORDER — OXYCODONE HCL 5 MG PO TABS: 5.0000 mg | ORAL_TABLET | Freq: Once | ORAL | Status: DC | PRN

## 2022-03-23 MED ORDER — KETAMINE HCL 10 MG/ML IJ SOLN
INTRAMUSCULAR | Status: DC | PRN
  Administered 2022-03-23: 20 mg via INTRAVENOUS

## 2022-03-23 MED ORDER — ONDANSETRON HCL 4 MG/2ML IJ SOLN
INTRAMUSCULAR | Status: AC
  Filled 2022-03-23: qty 2

## 2022-03-23 MED ORDER — PROMETHAZINE HCL 25 MG/ML IJ SOLN: 6.2500 mg | INTRAMUSCULAR | Status: DC | PRN

## 2022-03-23 MED ORDER — ACETAMINOPHEN 500 MG PO TABS
ORAL_TABLET | ORAL | Status: AC
  Administered 2022-03-23: 1000 mg via ORAL
  Filled 2022-03-23: qty 2

## 2022-03-23 MED ORDER — CELECOXIB 200 MG PO CAPS
ORAL_CAPSULE | ORAL | Status: AC
  Administered 2022-03-23: 200 mg via ORAL
  Filled 2022-03-23: qty 1

## 2022-03-23 MED ORDER — CARVEDILOL 12.5 MG PO TABS
12.5000 mg | ORAL_TABLET | Freq: Two times a day (BID) | ORAL | Status: DC
  Administered 2022-03-23 – 2022-03-25 (×4): 12.5 mg via ORAL
  Filled 2022-03-23 (×4): qty 1

## 2022-03-23 MED ORDER — PROPOFOL 10 MG/ML IV BOLUS
INTRAVENOUS | Status: DC | PRN
Start: 2022-03-23 — End: 2022-03-23
  Administered 2022-03-23: 150 mg via INTRAVENOUS
  Administered 2022-03-23: 50 mg via INTRAVENOUS

## 2022-03-23 MED ORDER — PHENYLEPHRINE HCL-NACL 20-0.9 MG/250ML-% IV SOLN
INTRAVENOUS | Status: DC | PRN
  Administered 2022-03-23: 40 ug/min via INTRAVENOUS

## 2022-03-23 SURGICAL SUPPLY — 101 items
BLADE CLIPPER SURG (BLADE) ×1 IMPLANT
BLADE SURG SZ10 CARB STEEL (BLADE) ×2 IMPLANT
BLADE SURG SZ11 CARB STEEL (BLADE) ×2 IMPLANT
CANNULA REDUC XI 12-8 STAPL (CANNULA) ×1
CANNULA REDUCER 12-8 DVNC XI (CANNULA) ×1 IMPLANT
CLIP LIGATING HEMO O LOK GREEN (MISCELLANEOUS) ×1 IMPLANT
COVER TIP SHEARS 8 DVNC (MISCELLANEOUS) ×1 IMPLANT
COVER TIP SHEARS 8MM DA VINCI (MISCELLANEOUS) ×1
DERMABOND ADVANCED (GAUZE/BANDAGES/DRESSINGS) ×1
DERMABOND ADVANCED .7 DNX12 (GAUZE/BANDAGES/DRESSINGS) IMPLANT
DRAPE ARM DVNC X/XI (DISPOSABLE) ×4 IMPLANT
DRAPE COLUMN DVNC XI (DISPOSABLE) ×1 IMPLANT
DRAPE DA VINCI XI ARM (DISPOSABLE) ×4
DRAPE DA VINCI XI COLUMN (DISPOSABLE) ×1
DRAPE LEGGINS SURG 28X43 STRL (DRAPES) ×2 IMPLANT
DRAPE UNDER BUTTOCK W/FLU (DRAPES) ×2 IMPLANT
DRSG OPSITE POSTOP 3X4 (GAUZE/BANDAGES/DRESSINGS) ×1 IMPLANT
DRSG OPSITE POSTOP 4X10 (GAUZE/BANDAGES/DRESSINGS) IMPLANT
DRSG OPSITE POSTOP 4X8 (GAUZE/BANDAGES/DRESSINGS) IMPLANT
ELECT BLADE 6.5 EXT (BLADE) IMPLANT
ELECT CAUTERY BLADE 6.4 (BLADE) ×2 IMPLANT
ELECT REM PT RETURN 9FT ADLT (ELECTROSURGICAL) ×2 IMPLANT
ELECTRODE REM PT RTRN 9FT ADLT (ELECTROSURGICAL) ×1 IMPLANT
GLOVE BIO SURGEON STRL SZ 6.5 (GLOVE) ×9 IMPLANT
GLOVE BIOGEL PI IND STRL 6.5 (GLOVE) ×3 IMPLANT
GLOVE BIOGEL PI INDICATOR 6.5 (GLOVE) ×6
GOWN STRL REUS W/ TWL LRG LVL3 (GOWN DISPOSABLE) ×6 IMPLANT
GOWN STRL REUS W/TWL LRG LVL3 (GOWN DISPOSABLE) ×6
GRASPER SUT TROCAR 14GX15 (MISCELLANEOUS) IMPLANT
HANDLE YANKAUER SUCT BULB TIP (MISCELLANEOUS) ×2 IMPLANT
IRRIGATION STRYKERFLOW (MISCELLANEOUS) IMPLANT
IRRIGATOR STRYKERFLOW (MISCELLANEOUS) IMPLANT
IRRIGATOR SUCT 8 DISP DVNC XI (IRRIGATION / IRRIGATOR) IMPLANT
IRRIGATOR SUCTION 8MM XI DISP (IRRIGATION / IRRIGATOR)
IV NS 1000ML (IV SOLUTION)
IV NS 1000ML BAXH (IV SOLUTION) IMPLANT
KIT PINK PAD W/HEAD ARE REST (MISCELLANEOUS) ×2 IMPLANT
KIT PINK PAD W/HEAD ARM REST (MISCELLANEOUS) ×1 IMPLANT
LABEL OR SOLS (LABEL) IMPLANT
MANIFOLD NEPTUNE II (INSTRUMENTS) ×2 IMPLANT
NDL INSUFFLATION 14GA 120MM (NEEDLE) ×1 IMPLANT
NEEDLE HYPO 22GX1.5 SAFETY (NEEDLE) ×2 IMPLANT
NEEDLE INSUFFLATION 14GA 120MM (NEEDLE) ×2 IMPLANT
OBTURATOR OPTICAL STANDARD 8MM (TROCAR) ×1
OBTURATOR OPTICAL STND 8 DVNC (TROCAR) ×1 IMPLANT
OBTURATOR OPTICALSTD 8 DVNC (TROCAR) ×1 IMPLANT
PACK COLON CLEAN CLOSURE (MISCELLANEOUS) ×2 IMPLANT
PACK LAP CHOLECYSTECTOMY (MISCELLANEOUS) ×2 IMPLANT
PENCIL ELECTRO HAND CTR (MISCELLANEOUS) ×2 IMPLANT
PORT ACCESS TROCAR AIRSEAL 5 (TROCAR) ×2 IMPLANT
RELOAD STAPLE 45 3.5 BLU DVNC (STAPLE) IMPLANT
RELOAD STAPLE 60 3.5 BLU DVNC (STAPLE) IMPLANT
RELOAD STAPLER 3.5X45 BLU DVNC (STAPLE) IMPLANT
RELOAD STAPLER 3.5X60 BLU DVNC (STAPLE) ×3 IMPLANT
RETRACTOR RING XSMALL (MISCELLANEOUS) IMPLANT
RETRACTOR WOUND ALXS 18CM SML (MISCELLANEOUS) IMPLANT
RTRCTR WOUND ALEXIS 13CM XS SH (MISCELLANEOUS) IMPLANT
RTRCTR WOUND ALEXIS O 18CM SML (MISCELLANEOUS) IMPLANT
SEAL CANN UNIV 5-8 DVNC XI (MISCELLANEOUS) ×3 IMPLANT
SEAL XI 5MM-8MM UNIVERSAL (MISCELLANEOUS) ×3
SEALER VESSEL DA VINCI XI (MISCELLANEOUS) ×1
SEALER VESSEL EXT DVNC XI (MISCELLANEOUS) IMPLANT
SET TRI-LUMEN FLTR TB AIRSEAL (TUBING) ×2 IMPLANT
SOL PREP PVP 2OZ (MISCELLANEOUS) ×2 IMPLANT
SOLUTION ELECTROLUBE (MISCELLANEOUS) ×2 IMPLANT
SOLUTION PREP PVP 2OZ (MISCELLANEOUS) ×1 IMPLANT
SPONGE T-LAP 18X18 ~~LOC~~+RFID (SPONGE) ×2 IMPLANT
SPONGE T-LAP 4X18 ~~LOC~~+RFID (SPONGE) ×2 IMPLANT
STAPLER 45 DA VINCI SURE FORM (STAPLE)
STAPLER 45 SUREFORM DVNC (STAPLE) IMPLANT
STAPLER 60 DA VINCI SURE FORM (STAPLE) ×1
STAPLER 60 SUREFORM DVNC (STAPLE) IMPLANT
STAPLER CANNULA SEAL DVNC XI (STAPLE) ×1 IMPLANT
STAPLER CANNULA SEAL XI (STAPLE) ×1
STAPLER RELOAD 3.5X45 BLU DVNC (STAPLE) IMPLANT
STAPLER RELOAD 3.5X45 BLUE (STAPLE)
STAPLER RELOAD 3.5X60 BLU DVNC (STAPLE) ×3 IMPLANT
STAPLER RELOAD 3.5X60 BLUE (STAPLE) ×3
SURGILUBE 2OZ TUBE FLIPTOP (MISCELLANEOUS) ×2 IMPLANT
SUT MNCRL 4-0 (SUTURE) ×1
SUT MNCRL 4-0 27 PS-2 XMFL (SUTURE) ×1 IMPLANT
SUT MNCRL 4-0 27XMFL (SUTURE) ×1
SUT PDS PLUS 0 (SUTURE)
SUT PDS PLUS AB 0 CT-2 (SUTURE) ×2 IMPLANT
SUT PROLENE 2-0 (SUTURE) ×1
SUT PROLENE 2-0 RB1 36X2 ARM (SUTURE) ×1 IMPLANT
SUT SILK 0 SH 30 (SUTURE) ×2 IMPLANT
SUT SILK 3-0 (SUTURE) IMPLANT
SUT V-LOC 90 ABS 3-0 VLT  V-20 (SUTURE) ×3
SUT V-LOC 90 ABS 3-0 VLT V-20 (SUTURE) IMPLANT
SUT VIC AB 3-0 SH 27 (SUTURE) ×1
SUT VIC AB 3-0 SH 27X BRD (SUTURE) ×4 IMPLANT
SUT VICRYL 0 AB UR-6 (SUTURE) ×2 IMPLANT
SUT VLOC 90 6 CV-15 VIOLET (SUTURE) ×2 IMPLANT
SUTURE MNCRL 4-0 27XMF (SUTURE) ×2 IMPLANT
SUTURE PROLEN 2-0 RB1 36X2 ARM (SUTURE) IMPLANT
SYR 30ML LL (SYRINGE) ×4 IMPLANT
SYS TROCAR 1.5-3 SLV ABD GEL (ENDOMECHANICALS) ×2 IMPLANT
SYSTEM TROCR 1.5-3 SLV ABD GEL (ENDOMECHANICALS) ×1 IMPLANT
TRAY FOLEY MTR SLVR 16FR STAT (SET/KITS/TRAYS/PACK) ×2 IMPLANT
WATER STERILE IRR 500ML POUR (IV SOLUTION) ×1 IMPLANT

## 2022-03-23 NOTE — Progress Notes (Signed)
Notified patient spouse Rockie Vawter that patient is doing well and just arrived to room 228 on the second floor. Dr. Peyton Najjar in to update patient post op and called spouse ?

## 2022-03-23 NOTE — Transfer of Care (Addendum)
Immediate Anesthesia Transfer of Care Note ? ?Patient: [name] [name] ? ?Procedure(s) Performed: XI ROBOT ASSISTED SIGMOID COLECTOMY (Abdomen) ? ?Patient Location: PACU ? ?Anesthesia Type:General ? ?Level of Consciousness: drowsy ? ?Airway & Oxygen Therapy: Patient Spontanous Breathing and Patient connected to face mask ? ?Post-op Assessment: Report given to RN ? ?Post vital signs: stable ? ?Last Vitals:  ?Vitals Value Taken Time  ?BP    ?Temp    ?Pulse    ?Resp    ?SpO2    ? ? ?Last Pain:  ?Vitals:  ? 03/23/22 0953  ?TempSrc: Temporal  ?PainSc: 0-No pain  ?   ? ?  ? ?Complications: No notable events documented. ?

## 2022-03-23 NOTE — Op Note (Signed)
Preoperative diagnosis: Descending Colon Tubular adenoma ? ?Postoperative diagnosis: Descending Colon Tubular adenoma ?                                            Umbilical hernia ? ?Procedure: Robotic assisted laparoscopic partial (descending) colectomy. ?Umbilical hernia repair ?  ?Anesthesia: GETA ?  ?Surgeon: Herbert Pun, MD ? ?Assistant: None  ?  ?Wound Classification: Clean contaminated ?  ?Specimen: Descending colon ?                   Proximal descending colon ?  ?Complications: None ?  ?Estimated Blood Loss: 25 mL ? ? ?Indications: Patient is a 56 y.o. female with a descending colon polyp not amendable for endoscopy resection concerning for malignancy due to central depression. Biopsy showed tubular adenoma. Elective resection was indicated.   ?  ?FIndings: ?1.  Descending colon with tattoo ?2.  Poly right at the resection proximal margin so an additional proximal descending colon resected.  ?3.  Huge uterus with multiple large fibroids.  ?4. Adequate ICG green perfusion at the anastomosis ?5.  1 cm, reducible umbilical hernia. ?6.  Adequate hemostasis.  ?7.  No gross metastasis noted ? ? ? ? ? ?  ?Description of procedure: The patient was placed on the operating table in the lithotomy position, both arms tucked. General anesthesia was induced. A time-out was completed verifying correct patient, procedure, site, positioning, and implant(s) and/or special equipment prior to beginning this procedure. The abdomen was prepped and draped in the usual sterile fashion.  ?  ?A Veress needle was inserted on umbilical area.  Abdominal cavity was insufflated to 15 mmHg. Patient tolerated insufflation well.  An 8 mm port was inserted in an Optiview fashion in the right upper quadrant.  Two additional 8 mm ports and one 12 mm port were inserted under direct visualization along the right side of the abdominal wall. 5 mm assistant port was then placed on the right subcostal area.  No injuries from trocar  placements were noted. The table was placed in the Trendelenburg position.  Xi robotic platform was then brought to the operative field and docked at an angle from the left lower quadrant.  Tip up grasper and fenestrated bipolar were placed in the left arm ports.  Scissors were placed in right arm port. ?  ?Examination of the abdominal cavity noted no signs of gross metastasis.  The descending colon with inking was noted.  Abundant amount of adhesions were identified in the pelvis and left lower quadrant.  Difficult and time consuming lysis of adhesion was done with scissors.   ? ?After lysis of adhesions, the sigmoid and descending colon was mobilized from the lateral attachment following the [employer]. This was followed up to the splenic flexure.  A small window on the mesentery of the proximal descending colon proximal to the tattoo where healthy colon was identified was done. Then the mesentery was divided with Vessel Sealer device. The mesentery was divided down to the distal descending.  2.5 mg of ICG green was flushed intravenously and the site of adequate vascularity was identified.  The proximal and distal margins were identified.  Proximal and distal margins were divided with stapler.  I scrubbed back and made a 4 cm incision in the midline.  I remove the specimen through the midline.  The specimen was opened  on the back table and inspected.  The ulcerated polyp was identified right at the proximal transection line.  It was decided to do a second resection of the proximal descending colon.  The midline incision was approximated with 2-0 Prolene to be able to get pneumoperitoneum.   ? ?I went back to the robot.  Again with the vessel sealer the mesentery of the proximal sigmoid colon was resected up to be posterior colonic wall.  An additional 2 cm proximal descending colon was resected with stapler. ? ?Another 2.5 mg of ICG green with large and adequate perfusion was seen on the chronic flap  pedicles.  Since the resected colon was too high on the descending colon it was decided to proceed with a handsewn anastomosis.  The proximal and distal margins were approximated with 3 oh V-Loc to do the procedure outer layer closure.  Then 3 cm colotomies were done on the proximal and distal segments of the colon.  With a 3-0 V-Loc, the inner layer was started at the 6 o'clock position starting counterclockwise to the 3 o'clock position.  Then a second V-Loc started at the 6 o'clock position and started clockwise to the 9:00 position.  Then again the 3 oh V-Loc at the 6 o'clock position was continued to the anterior surface up to the 12 o'clock position.  The V-Loc at the 9 o'clock position was then used to reach the 12:00 positions.  The V-Loc from the outer layer was then used to complete the second layers on the anterior side. ? ?The proximal descending colon was removed through the midline incision. The trocars were removed under direct vision.  The midline incision was extended upright to be able to have access to the umbilical hernia.  A 1 cm umbilical hernia was identified.  The hernia sac was dissected free of the abdominal wall.  The hernia defect was repaired with 2-0 Vicryl. The fascia of the midline incision was closed with a #0 STRATAFIX suture.  The fascia of the 12 mm trocar was closed with 2-0 Vicryl.  The skin was closed with 4-0 Monocryl sutures in a running fashion and Dermabond was applied. The sponge and instrument count were correct, blood loss was minimal, and there were no complications.  ?  ?The patient tolerated the procedure well, awakened from anesthesia and was taken to the postanesthesia care unit in satisfactory condition.  Foley still in place.  Sponge count and instrument count correct at the end of the procedure. ? ?

## 2022-03-23 NOTE — H&P (Signed)
PATIENT PROFILE: ?[name] [name] is a 56 y.o. female who presents to the Clinic for consultation at the request of Dr. 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 ? ?PCP: Sheron Nightingale, PA ? ?HISTORY OF PRESENT ILLNESS: ?[name] reports she had screening colonoscopy and she was found with a large polyp in the descending colon. The quality had depigmentation in the center of the polyp concerning for malignancy. Biopsy shows tubular adenoma. ? ?Patient has history of cardiac arrest and anoxic brain injury by unknown cause. She has been evaluated by cardiology and neurology after long but successful recovery from hospitalization. She has been on physical and occupational therapy is progressing adequately. I evaluated her chart and last cardiology evaluation on May 2022 she was stable and doing adequately. Patient not on any antiplatelet or any anticoagulation therapy. ? ?Patient has family history of colon cancer (aunt). Denies weight loss. Patient denies changes in bowel habits or significant rectal bleeding. ? ?PROBLEM LIST: ?Problem List Date Reviewed: 09/06/2018  ?Noted  ?Anoxic brain injury (CMS-HCC) 02/20/2021  ?History of cardiac arrest 02/20/2021  ?Prediabetes 10/02/2016  ?Hypokalemia 04/01/2016  ?Anemia 02/05/2015  ?Uterine leiomyoma 03/11/2012  ?Chronic urticaria (Chronic) 12/09/2011  ?Menorrhagia 12/09/2011  ?Hypertension (Chronic) 12/09/2011  ? ?GENERAL REVIEW OF SYSTEMS:  ? ?General ROS: negative for - chills, fatigue, fever, weight gain or weight loss ?Allergy and Immunology ROS: negative for - hives  ?Hematological and Lymphatic ROS: negative for - bleeding problems or bruising, negative for palpable nodes ?Endocrine ROS: negative for - heat or cold intolerance, hair changes ?Respiratory ROS: negative for - cough, shortness of breath or wheezing ?Cardiovascular ROS: no chest pain or palpitations ?GI ROS: negative for nausea, vomiting, abdominal pain, diarrhea,  constipation ?Musculoskeletal ROS: negative for - joint swelling or muscle pain ?Neurological ROS: negative for - confusion, syncope ?Dermatological ROS: negative for pruritus and rash ?Psychiatric: negative for anxiety, depression, difficulty sleeping. Positive for memory loss ? ?MEDICATIONS: ?Current Outpatient Medications  ?Medication Sig Dispense Refill  ? amLODIPine (NORVASC) 10 MG tablet Take 1 tablet (10 mg total) by mouth once daily 90 tablet 1  ? atorvastatin (LIPITOR) 80 MG tablet Take 1 tablet (80 mg total) by mouth once daily 90 tablet 1  ? ferrous sulfate 325 (65 FE) MG tablet Take 325 mg by mouth daily with breakfast Reported on 04/01/2016  ? fexofenadine (ALLEGRA) 180 MG tablet Take 180 mg by mouth once daily. Reported on 04/01/2016 ? ? metoprolol tartrate (LOPRESSOR) 25 MG tablet Take 1 tablet (25 mg total) by mouth 2 (two) times daily 180 tablet 1  ? peg-electrolyte (NULYTELY) solution Take 4,000 mLs by mouth as directed 4000 mL 0  ? potassium chloride (KLOR-CON) 20 MEQ ER tablet Take 1 tablet (20 mEq total) by mouth 2 (two) times daily 180 tablet 1  ? Saccharomyces boulardii (FLORASTOR) 250 mg capsule Take 250 mg by mouth 2 (two) times daily  ? ciprofloxacin HCl (CIPRO) 500 MG tablet Take 1 tablet (500 mg total) by mouth 2 (two) times daily for 1 day 2 tablet 0  ? metroNIDAZOLE (FLAGYL) 500 MG tablet Take 2 tablets at 2 pm, 3 pm and 10 pm the day before surgery. 6 tablet 0  ? ?No current facility-administered medications for this visit.  ? ?ALLERGIES: ?Patient has no known allergies. ? ?PAST MEDICAL HISTORY: ?Past Medical History:  ?Diagnosis Date  ? Allergic rhinitis  ? Anemia  ? Cardiac arrest (CMS-HCC) 12/02/2020  ? Hypertension  ? ?PAST SURGICAL HISTORY: ?Past  Surgical History:  ?Procedure Laterality Date  ? COLONOSCOPY 02/21/2014  ?Adenomatous Polyp - Rpt 3 yrs per Dr. Rayann Heman (amw)  ? EGD 12/13/2020  ?(Inpatient) PEG tube placement was successful/Repeat as needed/CTL  ? COLONOSCOPY 02/06/2022   ?High grade dysplasia within an adenoma/Tubular adenomas/Repeat 53yrReferral to General surgery/CTL  ? CESAREAN SECTION  ? SKIN BIOPSY  ? ? ?FAMILY HISTORY: ?Family History  ?Problem Relation Age of Onset  ? Anxiety Mother  ? High blood pressure (Hypertension) Mother  ? Diabetes type II Mother  ? Leukemia Mother  ? Arthritis Mother  ? Diabetes type II Maternal Grandmother  ? Leukemia Brother  ? Colon cancer Maternal Aunt  ?557 ? Colon polyps Neg Hx  ? ? ?SOCIAL HISTORY: ?Social History  ? ?Socioeconomic History  ? Marital status: Married  ?Occupational History  ? Occupation: admin asst  ?Tobacco Use  ? Smoking status: Former  ?Years: 15.00  ?Types: Cigarettes  ?Quit date: 11/23/2005  ?Years since quitting: 16.2  ? Smokeless tobacco: Never  ?Vaping Use  ? Vaping Use: Never used  ?Substance and Sexual Activity  ? Alcohol use: No  ? Drug use: No  ?Types: Other-see comments  ? Sexual activity: Defer  ?Social History Narrative  ?Married African-American female; 2 children, son (2001), daughter (2004).  ? ?PHYSICAL EXAM: ?Vitals:  ?02/17/22 1058  ?BP: (!) 168/105  ?Pulse: 84  ? ?Body mass index is 37.43 kg/m?. ?Weight: 92.1 kg (203 lb)  ? ?GENERAL: Alert, active, oriented x3 ? ?HEENT: Pupils equal reactive to light. Extraocular movements are intact. Sclera clear. Palpebral conjunctiva normal red color.Pharynx clear. ? ?NECK: Supple with no palpable mass and no adenopathy. ? ?LUNGS: Sound clear with no rales rhonchi or wheezes. ? ?HEART: Regular rhythm S1 and S2 without murmur. ? ?ABDOMEN: Soft and depressible, nontender with no palpable mass, no hepatomegaly.  ? ?EXTREMITIES: Well-developed well-nourished symmetrical with no dependent edema. ? ?NEUROLOGICAL: Awake alert oriented, facial expression symmetrical, moving all extremities. ? ?REVIEW OF DATA: ?I have reviewed the following data today: ?Initial consult on 02/17/2022  ?Component Date Value  ? WBC (White Blood Cell Co* 02/17/2022 5.3  ? RBC (Red Blood Cell Coun*  02/17/2022 4.39  ? Hemoglobin 02/17/2022 14.0  ? Hematocrit 02/17/2022 41.6  ? MCV (Mean Corpuscular Vo* 02/17/2022 94.8  ? MCH (Mean Corpuscular He* 02/17/2022 31.9 (H)  ? MCHC (Mean Corpuscular H* 02/17/2022 33.7  ? Platelet Count 02/17/2022 205  ? RDW-CV (Red Cell Distrib* 02/17/2022 13.2  ? MPV (Mean Platelet Volum* 02/17/2022 11.9  ? Neutrophils 02/17/2022 2.10  ? Lymphocytes 02/17/2022 2.02  ? Monocytes 02/17/2022 0.35  ? Eosinophils 02/17/2022 0.75 (H)  ? Basophils 02/17/2022 0.05  ? Neutrophil % 02/17/2022 39.8  ? Lymphocyte % 02/17/2022 38.3  ? Monocyte % 02/17/2022 6.6  ? Eosinophil % 02/17/2022 14.2 (H)  ? Basophil% 02/17/2022 0.9  ? Immature Granulocyte % 02/17/2022 0.2  ? Immature Granulocyte Cou* 02/17/2022 0.01  ? ? ?ASSESSMENT: ?Ms. CKasingeris a 56y.o. female presenting for consultation for surgical management of unresectable descending colon polyp. ? ?Patient with large tubular adenoma of the descending colon. There was found unresectable on colonoscopy. GI discussed case with advanced endoscopy and there was no benefit of repeating colonoscopy since the characteristic of the polyp were concerning of malignant fissure source as central indentation of the polyp. Patient understand that the purpose of the colectomy is to remove the tubular adenoma polyp. She understand there is a possibility of upgrading the biopsy to malignancy. We  will treat this as a malignant lesion including lymphadenectomy. I discussed with the patient, the husband and the amount about the surgical management. I discussed with the patient about the goals of the surgery and the recovery period in the hospital. I also discussed with the patient the risks of surgery that includes injury to adjacent organ, anastomosis leak, intestinal obstruction, intra-abdominal infection, bleeding, injury to ureters and bladder, among others. They report he understood and agreed to proceed. I will get cardiology clearance due to her previous  history of cardiac arrest. Patient seems to be stable without chest pain or shortness of breath she is able to meet 4 METS without chest pain or shortness of breath. ? ?Adenomatous polyp of descending colon [D12.4] ? ?

## 2022-03-23 NOTE — Anesthesia Procedure Notes (Signed)
Procedure Name: Intubation ?Date/Time: 03/23/2022 11:18 AM ?Performed by: Kerri Perches, CRNA ?Pre-anesthesia Checklist: Patient identified, Patient being monitored, Timeout performed, Emergency Drugs available and Suction available ?Patient Re-evaluated:Patient Re-evaluated prior to induction ?Oxygen Delivery Method: Circle system utilized ?Preoxygenation: Pre-oxygenation with 100% oxygen ?Induction Type: IV induction ?Ventilation: Mask ventilation without difficulty ?Laryngoscope Size: 3 and McGraph ?Grade View: Grade I ?Tube type: Oral ?Tube size: 7.0 mm ?Number of attempts: 1 ?Airway Equipment and Method: Stylet ?Placement Confirmation: ETT inserted through vocal cords under direct vision, positive ETCO2 and breath sounds checked- equal and bilateral ?Secured at: 21 cm ?Tube secured with: Tape ?Dental Injury: Teeth and Oropharynx as per pre-operative assessment  ? ? ? ? ?

## 2022-03-23 NOTE — Anesthesia Preprocedure Evaluation (Addendum)
Anesthesia Evaluation  ?Patient identified by MRN, date of birth, ID bandGeneral Assessment Comment:Pt admitted 1/10 with cardiac arrest. ?Intubated since then, diffuse brain damage. ?Echo OK ? ?Reviewed: ?Allergy & Precautions, NPO status , Patient's Chart, lab work & pertinent test results, reviewed documented beta blocker date and time  ? ?History of Anesthesia Complications ?Negative for: history of anesthetic complications ? ?Airway ?Mallampati: II ? ?TM Distance: >3 FB ? ? ? ? Dental ? ?(+) Teeth Intact ?  ?Pulmonary ?neg sleep apnea, neg COPD, Patient abstained from smoking.Not current smoker, former smoker,  ?  ?Pulmonary exam normal ?breath sounds clear to auscultation ? ? ? ? ? ? Cardiovascular ?METShypertension, Pt. on home beta blockers and Pt. on medications ?(-) CAD and (-) Past MI + dysrhythmias  ?Rhythm:Regular Rate:Tachycardia ?- Systolic murmurs ?TTE 12/03/20: ?1. Left ventricular ejection fraction, by estimation, is 60 to 65%. The  ?left ventricle has normal function. The left ventricle has no regional  ?wall motion abnormalities. There is mild left ventricular hypertrophy.  ?Left ventricular diastolic parameters  ?were normal.  ??2. Right ventricular systolic function is normal. The right ventricular  ?size is normal. Tricuspid regurgitation signal is inadequate for assessing  ?PA pressure.  ??3. A small pericardial effusion is present. The pericardial effusion is  ?posterior and lateral to the left ventricle.  ??4. The mitral valve is grossly normal. No evidence of mitral valve  ?regurgitation. No evidence of mitral stenosis.  ??5. The aortic valve is tricuspid. Aortic valve regurgitation is not  ?visualized. No aortic stenosis is present.  ??6. The inferior vena cava is normal in size with greater than 50%  ?respiratory variability, suggesting right atrial pressure of 3 mmHg.  ? ?Coronary CTA performed on 02/27/2021 demonstrated normal coronary origin with  RIGHT-sided dominance.  There was no evidence of CAD.  Coronary calcium score 0.  Study interpreted as CAD-RADS 0, indicating the patient at a low risk for coronary events. ?  ?Neuro/Psych ?cognitive communication deficit due to history of anoxic encephalopathy in 2022 ?negative psych ROS  ? GI/Hepatic ?neg GERD  ,(+)  ?  ? (-) substance abuse ? , S/P percutaneous endoscopic gastrostomy (PEG) tube placement ?  ?Dysphagia ? ?colonic mass ?  ?Endo/Other  ?diabetes, Well Controlled ? Renal/GU ?negative Renal ROS  ? ?  ?Musculoskeletal ? ? Abdominal ?(+) + obese,   ?Peds ? Hematology ? ?(+) Blood dyscrasia, anemia , IDA   ?Anesthesia Other Findings ?H/o respiratory failure after v-fib cardiac arrest on 1/22 with anoxic brain injury necessitating tracheostomy and peg tube. She was originally found at home unresponsive. Stable cardiac rhythm established following 2 defibrillations and epinephrine administration. She was found to be severely hypokalemic and in DKA; K+ 2.1, CO2 20, lactic acid 10, and anion gap 21.  Patient required and extended 5-week admission to the hospital for anoxic encephalopathy, during which time she required tracheostomy and PEG tube. ? ?Pt was cleared by cardiology and seen in pre-care clinic. Notes reviewed. ? Reproductive/Obstetrics ? ?  ? ? ? ? ? ? ? ? ? ? ? ? ? ?  ?  ? ? ? ? ? ?Anesthesia Physical ? ?Anesthesia Plan ? ?ASA: 3 ? ?Anesthesia Plan: General  ? ?Post-op Pain Management: Regional block*, Celebrex PO (pre-op)* and Tylenol PO (pre-op)*  ? ?Induction: Intravenous ? ?PONV Risk Score and Plan: 3 and Ondansetron, Dexamethasone and Midazolam ? ?Airway Management Planned: Oral ETT ? ?Additional Equipment: None ? ?Intra-op Plan:  ? ?Post-operative Plan: Extubation in OR ? ?  Informed Consent: I have reviewed the patients History and Physical, chart, labs and discussed the procedure including the risks, benefits and alternatives for the proposed anesthesia with the patient or authorized  representative who has indicated his/her understanding and acceptance.  ? ? ? ?Dental advisory given ? ?Plan Discussed with: Surgeon, CRNA and Anesthesiologist ? ?Anesthesia Plan Comments:   ? ? ? ? ?Anesthesia Quick Evaluation ? ?

## 2022-03-24 ENCOUNTER — Encounter: Payer: Self-pay | Admitting: General Surgery

## 2022-03-24 LAB — BASIC METABOLIC PANEL
Anion gap: 13 (ref 5–15)
BUN: 11 mg/dL (ref 6–20)
CO2: 24 mmol/L (ref 22–32)
Calcium: 8 mg/dL — ABNORMAL LOW (ref 8.9–10.3)
Chloride: 101 mmol/L (ref 98–111)
Creatinine, Ser: 0.87 mg/dL (ref 0.44–1.00)
GFR, Estimated: >60 mL/min (ref >60)
Glucose, Bld: 142 mg/dL — ABNORMAL HIGH (ref 70–99)
Potassium: 3.1 mmol/L — ABNORMAL LOW (ref 3.5–5.1)
Sodium: 138 mmol/L (ref 135–145)

## 2022-03-24 LAB — CBC
HCT: 40.2 % (ref 36.0–46.0)
Hemoglobin: 13.5 g/dL (ref 12.0–15.0)
MCH: 31.5 pg (ref 26.0–34.0)
MCHC: 33.6 g/dL (ref 30.0–36.0)
MCV: 93.9 fL (ref 80.0–100.0)
Platelets: 206 10*3/uL (ref 150–400)
RBC: 4.28 MIL/uL (ref 3.87–5.11)
RDW: 13 % (ref 11.5–15.5)
WBC: 13.2 10*3/uL — ABNORMAL HIGH (ref 4.0–10.5)
nRBC: 0 % (ref 0.0–0.2)

## 2022-03-24 LAB — MAGNESIUM: Magnesium: 1.7 mg/dL (ref 1.7–2.4)

## 2022-03-24 LAB — PHOSPHORUS: Phosphorus: 2.7 mg/dL (ref 2.5–4.6)

## 2022-03-24 NOTE — Anesthesia Postprocedure Evaluation (Signed)
Anesthesia Post Note ? ?Patient: [name] [name] ? ?Procedure(s) Performed: XI ROBOT ASSISTED SIGMOID DESCENDING COLECTOMY WITH UMBILICAL HERNIA REPAIR (Abdomen) ?HERNIA REPAIR UMBILICAL ADULT (Abdomen) ? ?Patient location during evaluation: PACU ?Anesthesia Type: General ?Level of consciousness: awake and alert ?Pain management: pain level controlled ?Vital Signs Assessment: post-procedure vital signs reviewed and stable ?Respiratory status: spontaneous breathing, nonlabored ventilation and respiratory function stable ?Cardiovascular status: blood pressure returned to baseline and stable ?Postop Assessment: no apparent nausea or vomiting ?Anesthetic complications: no ? ? ?No notable events documented. ? ? ?Last Vitals:  ?Vitals:  ? 03/23/22 2332 03/24/22 0315  ?BP: 114/65 117/64  ?Pulse: (!) 107 64  ?Resp: 20 20  ?Temp: 36.8 ?C (!) 36.4 ?C  ?SpO2: 95% 94%  ?  ?Last Pain:  ?Vitals:  ? 03/23/22 2138  ?TempSrc:   ?PainSc: 0-No pain  ? ? ?  ?  ?  ?  ?  ?  ? ?[place] Ouch ? ? ? ? ?

## 2022-03-24 NOTE — Progress Notes (Addendum)
Removed foley catheter. Patient is due to void. ? ?Update: Patient voided @ [phone]. Ambulated well with minimal assistance to bathroom. ?

## 2022-03-24 NOTE — TOC Initial Note (Signed)
Transition of Care (TOC) - Initial/Assessment Note  ? ? ?Patient Details  ?Name: [name] [name] ?MRN: [id number] ?Date of Birth: May 28, 1966 ? ?Transition of Care (TOC) CM/SW Contact:    ?Laurena Slimmer, RN ?Phone Number: ?03/24/2022, 1:21 PM ? ?Clinical Narrative:                 ? ?Transition of Care (TOC) Screening Note ? ? ?Patient Details  ?Name: [name] [name] ?Date of Birth: [birth year] ? ? ?Transition of Care (TOC) CM/SW Contact:    ?Laurena Slimmer, RN ?Phone Number: ?03/24/2022, 1:22 PM ? ? ? ?Transition of Care Department [facility]) has reviewed patient and no TOC needs have been identified at this time. We will continue to monitor patient advancement through interdisciplinary progression rounds. If new patient transition needs arise, please place a TOC consult. ? ? ? ?  ?  ? ? ?Patient Goals and CMS Choice ?  ?  ?  ? ?Expected Discharge Plan and Services ?  ?  ?  ?  ?  ?                ?  ?  ?  ?  ?  ?  ?  ?  ?  ?  ? ?Prior Living Arrangements/Services ?  ?  ?  ?       ?  ?  ?  ?  ? ?Activities of Daily Living ?  ?  ? ?Permission Sought/Granted ?  ?  ?   ?   ?   ?   ? ?Emotional Assessment ?  ?  ?  ?  ?  ?  ? ?Admission diagnosis:  Tubular adenoma of colon [D12.6] ?Patient Active Problem List  ? Diagnosis Date Noted  ? Tubular adenoma of colon 03/23/2022  ? Multiple lung nodules 03/27/2021  ? Dysarthria and anarthria 02/11/2021  ? Sinus tachycardia   ? Labile blood glucose   ? Dysphagia   ? Seizure prophylaxis   ? Malnutrition of moderate degree 01/23/2021  ? Anoxic brain injury ([place]) 01/10/2021  ? Prediabetes   ? Dyslipidemia   ? S/P percutaneous endoscopic gastrostomy (PEG) tube placement ([place])   ? Status post tracheostomy ([place])   ? Aspiration pneumonia of both lower lobes due to gastric secretions ([place]) 01/05/2021  ? Acute respiratory failure ([place])   ? Hypoxic ischemic encephalopathy   ? Endotracheal tube present   ? Cardiac arrest ([place]) 12/02/2020  ? Essential hypertension 12/09/2011  ? ?PCP:  Marinda Elk, MD ?Pharmacy:   ?[facility] [place] [facility] ?[address] ([place]) [place] [address] ?Phone: [phone] Fax: [phone] ? ?[facility] ?[address] ?[place] [address] ?Phone: [phone] Fax: [phone] ? ? ? ? ?Social Determinants of Health (SDOH) Interventions ?  ? ?Readmission Risk Interventions ?   ? View : No data to display.  ?  ?  ?  ? ? ? ?

## 2022-03-24 NOTE — Progress Notes (Signed)
Mobility Specialist - Progress Note ? ? ? 03/24/22 1300  ?Mobility  ?Activity Ambulated with assistance in hallway  ?Level of Assistance Standby assist, set-up cues, supervision of patient - no hands on  ?Assistive Device None  ?Distance Ambulated (ft) 500 ft  ?Activity Response Tolerated well  ?$Mobility charge 1 Mobility  ? ? ?Pt ambulates in hallway with supervision, family at side voicing no complaints. Pt is left in bed with needs in reach and family at bedside. ? ?Merrily Brittle ?Mobility Specialist ?03/24/22, 1:06 PM ? ? ? ? ?

## 2022-03-24 NOTE — Progress Notes (Signed)
PT Cancellation Note ? ?Patient Details ?Name: [name] [name] ?MRN: [id number] ?DOB: 09-01-1966 ? ? ?Cancelled Treatment:    Reason Eval/Treat Not Completed: PT screened, no needs identified, will sign off. Per nursing pt ambulating in halls with her spouse and may not require PT services.  Spoke to patient who confirmed that she feels that she is Ind with mobility and at her functional baseline with no PT needs.  Will complete PT orders at this time but will reassess pt pending a change in status upon receipt of new PT orders. ? ? ? ?D. Royetta Asal PT, DPT ?03/24/22, 11:32 AM ? ?

## 2022-03-24 NOTE — Progress Notes (Signed)
Patient ID: MARVETTE SCHAMP, female   DOB: 1966/02/15, 56 y.o.   MRN: [id number] ?    SURGICAL PROGRESS NOTE  ? ?Hospital Day(s): 1.  ? ?Interval History: Patient seen and examined, no acute events or new complaints overnight. Patient reports feeling better.  Patient endorses control abdominal pain.  Pain controlled with pain medications.  She endorses passing gas.  She denies any nausea or vomiting. ? ?Vital signs in last 24 hours: [min-max] current  ?Temp:  [97 ?F (36.1 ?C)-98.9 ?F (37.2 ?C)] 98.9 ?F (37.2 ?C) (05/02 4696) ?Pulse Rate:  [64-108] 97 (05/02 0727) ?Resp:  [11-20] 16 (05/02 0727) ?BP: (114-155)/(64-102) 142/83 (05/02 0727) ?SpO2:  [93 %-100 %] 99 % (05/02 0727) ?Weight:  [92.5 kg] 92.5 kg (05/01 0953)     Height: '5\' 1"'$  (154.9 cm) Weight: 92.5 kg BMI (Calculated): 38.55  ? ?Physical Exam:  ?Constitutional: alert, cooperative and no distress  ?Respiratory: breathing non-labored at rest  ?Cardiovascular: regular rate and sinus rhythm  ?Gastrointestinal: soft, non-tender, and non-distended ? ?Labs:  ? ?  Latest Ref Rng & Units 03/24/2022  ?  5:17 AM 03/23/2022  ? 11:11 AM 02/03/2021  ?  4:58 AM  ?CBC  ?WBC 4.0 - 10.5 K/uL 13.2    5.8    ?Hemoglobin 12.0 - 15.0 g/dL 13.5   15.6   10.7    ?Hematocrit 36.0 - 46.0 % 40.2   46.0   33.5    ?Platelets 150 - 400 K/uL 206    181    ? ? ?  Latest Ref Rng & Units 03/24/2022  ?  5:17 AM 03/23/2022  ? 11:11 AM 04/17/2021  ?  3:10 PM  ?CMP  ?Glucose 70 - 99 mg/dL 142   118     ?BUN 6 - 20 mg/dL 11   8     ?Creatinine 0.44 - 1.00 mg/dL 0.87   0.70   0.40    ?Sodium 135 - 145 mmol/L 138   143     ?Potassium 3.5 - 5.1 mmol/L 3.1   3.5     ?Chloride 98 - 111 mmol/L 101   103     ?CO2 22 - 32 mmol/L 24      ?Calcium 8.9 - 10.3 mg/dL 8.0      ? ? ?Imaging studies: No new pertinent imaging studies ? ? ?Assessment/Plan:  ?56 y.o. female with abdominal adenoma of the descending colon 1 Day Post-Op s/p partial colectomy. ? ?-Adequate vital signs with no fever ?-Abdominal pain control ?-No  nausea or vomiting.  Will advance diet to full liquids ?-Endorses passing gas.  We will discontinue Entereg. ?-Encouraged to ambulate.  PT consulted. ?-Discontinue Foley catheter ? ?Gwendolyn Grant, MD ? ? ? ?

## 2022-03-25 MED ORDER — HYDROCODONE-ACETAMINOPHEN 5-325 MG PO TABS: 1.0000 | ORAL_TABLET | ORAL | 0 refills | Status: DC | PRN

## 2022-03-25 NOTE — Progress Notes (Signed)
Discharge instructions reviewed with patient including followup visits and new medications.  Understanding was verbalized and all questions were answered.  IV removed without complication; patient tolerated well.  Patient discharged home via wheelchair in stable condition escorted by volunteer staff.  

## 2022-03-25 NOTE — Discharge Summary (Signed)
?  Patient ID: ?[name] [name] ?MRN: [id number] ?DOB/AGE: Jun 29, 1966 56 y.o. ? ?Admit date: 03/23/2022 ?Discharge date: 03/25/2022 ? ? ?Discharge Diagnoses:  ?Principal Problem: ?  Tubular adenoma of colon ? ? ?Procedures: Robotic assisted laparoscopic partial colectomy ? ?Hospital Course: Patient with unresectable ascending colon polyp.  She underwent partial colectomy with anastomosis.  She has been tolerating the procedure well.  Patient is ambulating.  Patient is with pain control.  Patient has been tolerating diet.  Today she tolerated soft diet without any abdominal pain.  She is passing gas and having bowel movement.  No issues with the wounds. ? ?Physical Exam ?Constitutional:   ?   Appearance: Normal appearance.  ?HENT:  ?   Head: Normocephalic.  ?   Nose: Nose normal.  ?Cardiovascular:  ?   Rate and Rhythm: Normal rate and regular rhythm.  ?   Pulses: Normal pulses.  ?   Heart sounds: Normal heart sounds.  ?Pulmonary:  ?   Effort: Pulmonary effort is normal.  ?Abdominal:  ?   General: Abdomen is flat.  ?Musculoskeletal:  ?   Cervical back: Normal range of motion.  ?Skin: ?   General: Skin is warm.  ?Neurological:  ?   General: No focal deficit present.  ?   Mental Status: She is alert and oriented to person, place, and time.  ? ? ? ?Consults: None ? ?Disposition: Discharge disposition: 01-Home or Self Care ? ? ? ? ? ? ?Discharge Instructions   ? ? Diet - low sodium heart healthy   Complete by: As directed ?  ? Diet - low sodium heart healthy   Complete by: As directed ?  ? Increase activity slowly   Complete by: As directed ?  ? Increase activity slowly   Complete by: As directed ?  ? ?  ? ?Allergies as of 03/25/2022   ?No Known Allergies ?  ? ?  ?Medication List  ?  ? ?TAKE these medications   ? ?amLODipine 10 MG tablet ?Commonly known as: NORVASC ?Take 10 mg by mouth daily. ?  ?atorvastatin 80 MG tablet ?Commonly known as: LIPITOR ?Take 1 tablet (80 mg total) by mouth daily. ?What changed: when to take this ?   ?carvedilol 12.5 MG tablet ?Commonly known as: COREG ?Take 1 tablet (12.5 mg total) by mouth 2 (two) times daily. ?  ?HYDROcodone-acetaminophen 5-325 MG tablet ?Commonly known as: NORCO/VICODIN ?Take 1 tablet by mouth every 4 (four) hours as needed for moderate pain. ?  ?potassium chloride SA 20 MEQ tablet ?Commonly known as: [place]-Con M20 ?Take 2 tablets (40 mEq total) by mouth daily. ?What changed: when to take this ?  ?saccharomyces boulardii 250 MG capsule ?Commonly known as: FLORASTOR ?Take 1 capsule (250 mg total) by mouth 2 (two) times daily. ?  ? ?  ? ? Follow-up Information   ? ? Herbert Pun, MD Follow up in 2 week(s).   ?Specialty: General Surgery ?Contact information: ?[address] ?[place] [address] ?[phone] ? ? ?  ?  ? ?  ?  ? ?  ? ? ?

## 2022-03-25 NOTE — Plan of Care (Signed)
?  Problem: Education: ?Goal: Knowledge of General Education information will improve ?Description: Including pain rating scale, medication(s)/side effects and non-pharmacologic comfort measures ?Outcome: Progressing ?  ?Problem: Clinical Measurements: ?Goal: Ability to maintain clinical measurements within normal limits will improve ?Outcome: Progressing ?Goal: Will remain free from infection ?Outcome: Progressing ?Goal: Cardiovascular complication will be avoided ?Outcome: Progressing ?  ?Problem: Activity: ?Goal: Risk for activity intolerance will decrease ?Outcome: Progressing ?  ?Problem: Coping: ?Goal: Level of anxiety will decrease ?Outcome: Progressing ?  ?Problem: Elimination: ?Goal: Will not experience complications related to bowel motility ?Outcome: Progressing ?Goal: Will not experience complications related to urinary retention ?Outcome: Progressing ?  ?Problem: Pain Managment: ?Goal: General experience of comfort will improve ?Outcome: Progressing ?  ?

## 2022-03-25 NOTE — Discharge Instructions (Addendum)
?  Diet: Resume soft low fiber diet for 2 weeks. After two weeks, start regular high fiber diet. It is preferred to have frequent small meals than having 3 big meals a day. Drink at least 8 glasses of water daily.  ? ?Activity: No heavy lifting >10 pounds (children, pets, laundry, garbage) or strenuous activity for the next 6 weeks, but light activity and walking are encouraged.  ? ?It is normal to feel tire most of the time since your body is using energy to heal.   ? ?Wound care: May shower with soapy water and pat dry (do not rub incisions), but no baths or submerging incision underwater until follow-up. (no swimming)  ? ?Do not smoke, since smoking delays the process of healing, among other negative effects.  ? ?Call the office if the wound becomes red and start to drain pus.  ? ?Medications: Resume all home medications. For mild to moderate pain: acetaminophen (Tylenol) or ibuprofen (if no kidney disease). Combining Tylenol with alcohol can substantially increase your risk of causing liver disease. Narcotic pain medications, if prescribed, can be used for severe pain, though may cause nausea, constipation, and drowsiness. Do not combine Tylenol and Percocet within a 6 hour period as Percocet contains Tylenol. If you do not need the narcotic pain medication, you do not need to fill the prescription. Do not drink alcohol while taking narcotics.  ? ?Call office [phone]) at any time if any questions, worsening pain, fevers/chills, bleeding, drainage from incision site, or other concerns. ? ?

## 2022-03-26 ENCOUNTER — Other Ambulatory Visit: Payer: Self-pay | Admitting: General Surgery

## 2022-03-26 DIAGNOSIS — C186 Malignant neoplasm of descending colon: Secondary | ICD-10-CM

## 2022-04-01 ENCOUNTER — Encounter: Payer: Self-pay | Admitting: Internal Medicine

## 2022-04-01 ENCOUNTER — Inpatient Hospital Stay: Payer: Self-pay | Attending: Internal Medicine | Admitting: Internal Medicine

## 2022-04-01 DIAGNOSIS — D259 Leiomyoma of uterus, unspecified: Secondary | ICD-10-CM | POA: Insufficient documentation

## 2022-04-01 DIAGNOSIS — N888 Other specified noninflammatory disorders of cervix uteri: Secondary | ICD-10-CM | POA: Insufficient documentation

## 2022-04-01 DIAGNOSIS — G931 Anoxic brain damage, not elsewhere classified: Secondary | ICD-10-CM | POA: Insufficient documentation

## 2022-04-01 DIAGNOSIS — C186 Malignant neoplasm of descending colon: Secondary | ICD-10-CM

## 2022-04-01 DIAGNOSIS — Z8674 Personal history of sudden cardiac arrest: Secondary | ICD-10-CM | POA: Insufficient documentation

## 2022-04-01 NOTE — Progress Notes (Signed)
[place] ?CONSULT NOTE ? ?Patient Care Team: ?Marinda Elk, MD as PCP - General (Physician Assistant) ?Minna Merritts, MD as PCP - Cardiology (Cardiology) ?Clent Jacks, RN as Oncology Nurse Navigator ? ?CHIEF COMPLAINTS/PURPOSE OF CONSULTATION: COLON CANCER ? ? ?Oncology History Overview Note  ?# April 2023- [screening colonoscopy]-descending colon mass with ulceration; Dr. Peyton Najjar s/p surgery-stage I colon cancer PT1N0 ? ? ?#April 2022- ~2.5 cm lobulated lobe lung nodule [incidental on coronary CTA]; April 2022 PET scan-no significant uptake; however uterine enlargement-uptake fibroid versus malignancy ? ?#Uterine enlargement/fibroid versus malignancy ? ?# JAN 2022-cardiac arrest s/p resuscitation; anoxic brain injury-PEG/trach; rehab currently on; trach/PEG-explanted. ?  ?Cancer of descending colon ([place])  ?04/01/2022 Initial Diagnosis  ? Cancer of descending colon [facility]) ?  ?04/01/2022 Cancer Staging  ? Staging form: Colon and Rectum, AJCC 8th Edition ?- Clinical: Stage I (cT1, cN0, cM0) - Signed by Cammie Sickle, MD on 04/01/2022 ?Total positive nodes: 0 ? ?  ? ? ? ?HISTORY OF PRESENTING ILLNESS: Accompanied by husband.  Ambulating independently  ?[name] [name] 56 y.o.  female with multiple medical problems including lung nodules uterine fibroids history of anoxic brain injury is here for new diagnosis of colon cancer.   ? ?Patient had a surveillance colonoscopy-that was abnormal- "An ulcerated non-obstructing medium-sized mass was found in the descending colon. The mass was partially circumferential (involving one-third of the lumen circumference). The mass measured two cm in length".  This led to further evaluation with Dr. Peyton Najjar.  Patient is s/p surgery.  Recovering well. ? ? ?Review of Systems  ?Constitutional:  Positive for malaise/fatigue. Negative for chills, diaphoresis, fever and weight loss.  ?HENT:  Negative for nosebleeds and sore throat.   ?Eyes:  Negative for  double vision.  ?Respiratory:  Negative for cough, hemoptysis, sputum production, shortness of breath and wheezing.   ?Cardiovascular:  Negative for chest pain, palpitations, orthopnea and leg swelling.  ?Gastrointestinal:  Negative for abdominal pain, blood in stool, constipation, diarrhea, heartburn, melena, nausea and vomiting.  ?Genitourinary:  Negative for dysuria, frequency and urgency.  ?Musculoskeletal:  Positive for back pain and joint pain.  ?Skin: Negative.  Negative for itching and rash.  ?Neurological:  Negative for dizziness, tingling, focal weakness, weakness and headaches.  ?Endo/Heme/Allergies:  Does not bruise/bleed easily.  ?Psychiatric/Behavioral:  Negative for depression. The patient is not nervous/anxious and does not have insomnia.    ? ?MEDICAL HISTORY:  ?Past Medical History:  ?Diagnosis Date  ? Anemia   ? Anoxic brain injury ([place]) 11/2020  ? a.) following V-fib cardiac arrest. b.) Extended hospital admission x 5 weeks for anoxic encephalopathy requiring tracheostomy and PEG tube placement  ? Cardiac arrest with ventricular fibrillation ([place]) 12/02/2020  ? a.) found down at home "gurgling" and apneic; downtime unknown.  911 called and CPR was started.  EMS arrived and patient found to be in ventricular fibrillation; defibrillated x2 and epinephrine given.  Severe hypokalemia and DKA; K+ 2.1, CO2 20, lactic acid 10, gap 21.  ? Cognitive communication deficit   ? Colonic mass 02/06/2022  ? 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  ? History of 2019 novel coronavirus disease (COVID-19) 06/2021  ? History of respiratory failure 12/02/2020  ? Hyperlipidemia   ? Hypertension   ? Hypokalemia   ? a.) avoid diuretic medications per cardiology  ? IDA (iron deficiency anemia)   ? Internal hemorrhoids   ? Multiple lung nodules on CT   ? a.)  follow[facility] lung cancer screening team  ? Seizures ([place])   ? T2DM (type 2 diabetes mellitus) ([place])   ? Tubular adenoma of  colon 02/06/2022  ? a.) Bx (-) for high-grade dysplasia or malignancy  ? Uterine leiomyoma   ? ? ?SURGICAL HISTORY: ?Past Surgical History:  ?Procedure Laterality Date  ? COLONOSCOPY WITH PROPOFOL N/A 02/06/2022  ? Procedure: COLONOSCOPY WITH PROPOFOL;  Surgeon: Lesly Rubenstein, MD;  Location: [facility] ENDOSCOPY;  Service: Endoscopy;  Laterality: N/A;  ? PEG PLACEMENT N/A 12/13/2020  ? Procedure: PERCUTANEOUS ENDOSCOPIC GASTROSTOMY (PEG) PLACEMENT;  Surgeon: Lesly Rubenstein, MD;  Location: [facility] ENDOSCOPY;  Service: Endoscopy;  Laterality: N/A;  ? ROBOT ASSISTED SIGMOID COLECTOMY   03/23/2022  ? TRACHEOSTOMY TUBE PLACEMENT N/A 12/13/2020  ? Procedure: TRACHEOSTOMY;  Surgeon: Clyde Canterbury, MD;  Location: [facility] ORS;  Service: ENT;  Laterality: N/A;  ? UMBILICAL HERNIA REPAIR N/A 03/23/2022  ? Procedure: HERNIA REPAIR UMBILICAL ADULT;  Surgeon: Herbert Pun, MD;  Location: [facility] ORS;  Service: General;  Laterality: N/A;  ? ? ?SOCIAL HISTORY: ?Social History  ? ?Socioeconomic History  ? Marital status: Married  ?  Spouse name: Not on file  ? Number of children: Not on file  ? Years of education: Not on file  ? Highest education level: Not on file  ?Occupational History  ? Not on file  ?Tobacco Use  ? Smoking status: Former  ?  Types: Cigarettes  ?  Quit date: 2001  ?  Years since quitting: 22.3  ? Smokeless tobacco: Never  ?Vaping Use  ? Vaping Use: Never used  ?Substance and Sexual Activity  ? Alcohol use: No  ? Drug use: Never  ? Sexual activity: Not on file  ?Other Topics Concern  ? Not on file  ?Social History Narrative  ? 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   ? ?Social Determinants of Health  ? ?Financial Resource Strain: Not on file  ?Food Insecurity: Not on file  ?Transportation Needs: Not on file  ?Physical Activity: Not on file  ?Stress: Not on file  ?Social Connections: Not on file  ?Intimate Partner Violence: Not on file  ? ? ?FAMILY  HISTORY: ?Family History  ?Problem Relation Age of Onset  ? Hypertension Mother   ? Leukemia Mother   ? Diabetes Mother   ? Healthy Father   ? ? ?ALLERGIES:  has No Known Allergies. ? ?MEDICATIONS:  ?Current Outpatient Medications  ?Medication Sig Dispense Refill  ? amLODipine (NORVASC) 10 MG tablet Take 10 mg by mouth daily.    ? atorvastatin (LIPITOR) 80 MG tablet Take 1 tablet (80 mg total) by mouth daily. (Patient taking differently: Take 80 mg by mouth 2 (two) times daily.) 30 tablet 5  ? carvedilol (COREG) 12.5 MG tablet Take 1 tablet (12.5 mg total) by mouth 2 (two) times daily. 180 tablet 3  ? potassium chloride SA (KLOR-CON M20) 20 MEQ tablet Take 2 tablets (40 mEq total) by mouth daily. (Patient taking differently: Take 40 mEq by mouth 2 (two) times daily.) 60 tablet 5  ? HYDROcodone-acetaminophen (NORCO/VICODIN) 5-325 MG tablet Take 1 tablet by mouth every 4 (four) hours as needed for moderate pain. (Patient not taking: Reported on 04/01/2022) 20 tablet 0  ? saccharomyces boulardii (FLORASTOR) 250 MG capsule Take 1 capsule (250 mg total) by mouth 2 (two) times daily. (Patient not taking: Reported on 03/16/2022) 60 capsule 0  ? ?No current facility-administered medications for  this visit.  ? ? ?  ?. ? ?PHYSICAL EXAMINATION: ?ECOG PERFORMANCE STATUS: 0 - Asymptomatic ? ?Vitals:  ? 04/01/22 1000  ?BP: 130/86  ?Pulse: 74  ?Resp: 18  ?Temp: 97.8 ?F (36.6 ?C)  ? ?Filed Weights  ? 04/01/22 1000  ?Weight: 206 lb (93.4 kg)  ? ? ?Physical Exam ?HENT:  ?   Head: Normocephalic and atraumatic.  ?   Mouth/Throat:  ?   Pharynx: No oropharyngeal exudate.  ?Eyes:  ?   Pupils: Pupils are equal, round, and reactive to light.  ?Cardiovascular:  ?   Rate and Rhythm: Normal rate and regular rhythm.  ?Pulmonary:  ?   Effort: Pulmonary effort is normal. No respiratory distress.  ?   Breath sounds: Normal breath sounds. No wheezing.  ?Abdominal:  ?   General: Bowel sounds are normal. There is no distension.  ?   Palpations: Abdomen  is soft. There is no mass.  ?   Tenderness: There is no abdominal tenderness. There is no guarding or rebound.  ?Musculoskeletal:     ?   General: No tenderness. Normal range of motion.  ?   Cervical back:

## 2022-04-01 NOTE — Progress Notes (Signed)
Patient denies new problems/concerns today.   °

## 2022-04-01 NOTE — Assessment & Plan Note (Addendum)
#  Colon cancer descending colon stage I [screening colonoscopy]; PT1N0 stage I colon cancer.  Discussed that she is likely cured from her colon cancer with surgery more than 90% at times.  We will hold for any adjuvant therapy.  However I would recommend continued surveillance colonoscopies as recommended by GI.    #Incidental lung nodules right lower lobe lung nodule approximate 2.5 cm; not significantly PET avid.  Other subcentimeter lung nodules bilaterally  [s/p Dr.Aleskero].  September 2022 surveillance CT scan-stable.  Will recommend repeat scan in approximately 5 months.   #Uterine enlargement-hypermetabolic lesions-again discussed possibility of malignant [especially given lung nodules; small abdominal retroperitoneal hypermetabolic lymph node] versus benign etiologies.  Likely benign entities.  S/p evaluation with Dr. Secord.  Recommend follow-up with GYN oncology/follow-up MRI as recommended.  Informed nurse navigator  # DISPOSITION; # RE- schedule the CT scan in Sep 2023 to October/prior to next MD appointment # follow up in 5 months- MD: labs- Cbc/cmp-Thanks  # I reviewed the blood work- with the patient in detail; also reviewed the imaging independently [as summarized above]; and with the patient in detail.   Cc: Hayley-  

## 2022-04-03 ENCOUNTER — Ambulatory Visit
Admission: RE | Admit: 2022-04-03 | Discharge: 2022-04-03 | Disposition: A | Discharge status: Home / Self Care | Payer: Self-pay | Source: Ambulatory Visit | Attending: Nurse Practitioner | Admitting: Nurse Practitioner

## 2022-04-03 DIAGNOSIS — D259 Leiomyoma of uterus, unspecified: Secondary | ICD-10-CM | POA: Insufficient documentation

## 2022-04-03 IMAGING — MR MR PELVIS WO/W CM
22 of 24 series · 42 of 48 positions shown · IV contrast (gadavist)
Comparison: [DATE]

CLINICAL DATA: Follow-up uterine fibroids.

EXAM:
MRI PELVIS WITHOUT AND WITH CONTRAST
TECHNIQUE: Multiplanar multisequence MR imaging of the pelvis was performed
both before and after administration of intravenous contrast.
CONTRAST:  9mL GADAVIST GADOBUTROL 1 MMOL/ML IV SOLN

[Series 2: T2 · coronal · 5.0mm · 1.56mm/px · 2 of 40 slices shown (1 of 5)]
[im 1/40]
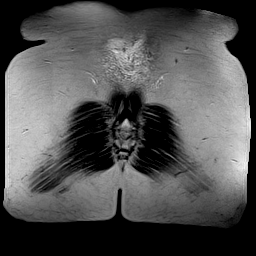
[im 40/40]
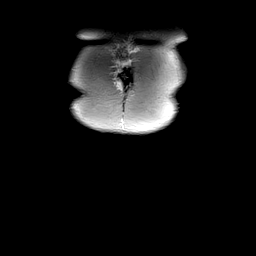

[Series 3: T2 · axial · 5.0mm · 0.47mm/px · z∈[-64,+200]mm · 2 of 45 slices shown (2 of 5)]
[im 1/45]
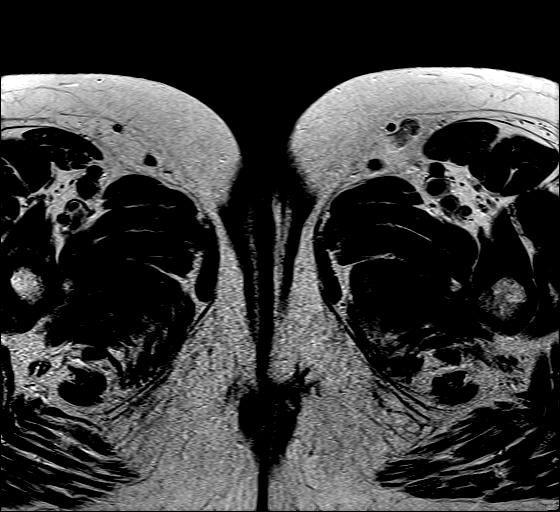
[im 45/45]
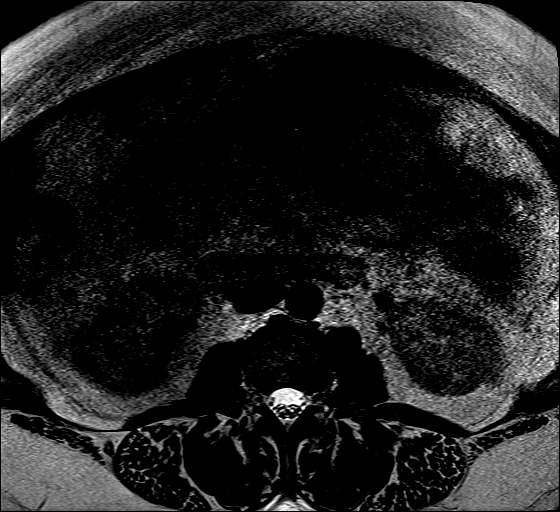

[Series 5: T2 · axial · 5.0mm · 0.47mm/px · z∈[-64,+200]mm · 2 of 45 slices shown (3 of 5)]
[im 1/45]
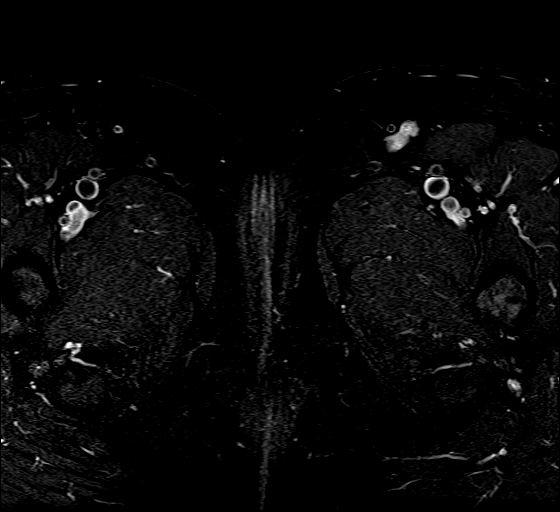
[im 45/45]
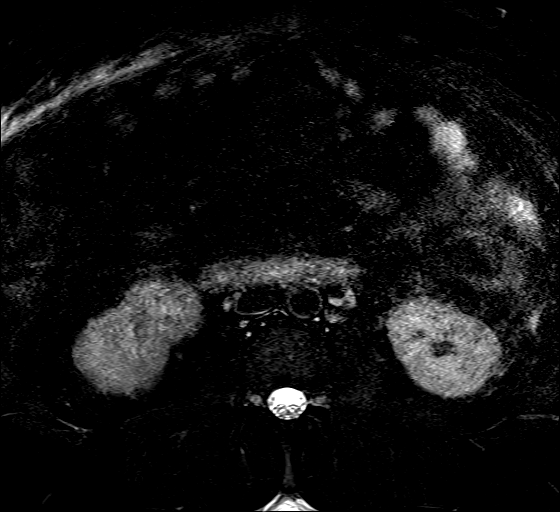

[Series 6: T2 · sagittal · 5.0mm · 0.75mm/px · 1 of 40 slices shown (4 of 5)]
[im 1/40]
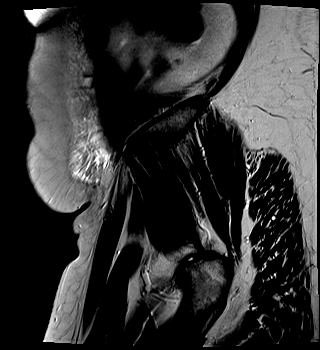

[Series 7: T2 · coronal · 5.0mm · 0.88mm/px · 1 of 36 slices shown (5 of 5)]
[im 1/36]
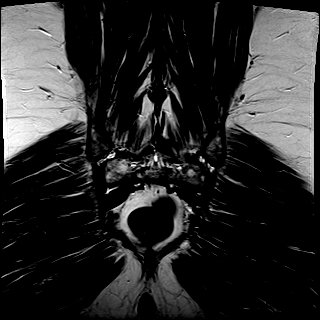

[Series 8: ax dwi_tracew · axial · 5.0mm · 1.34mm/px · z∈[-31,+167]mm · 4 of 102 slices shown]
[im 1/102]
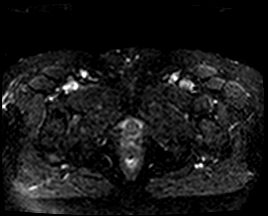
[im 34/102]
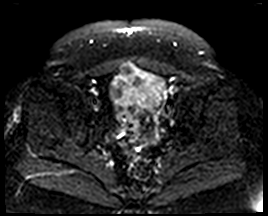
[im 68/102]
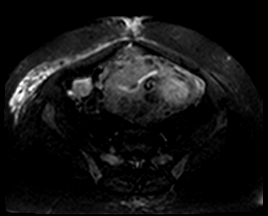
[im 102/102]
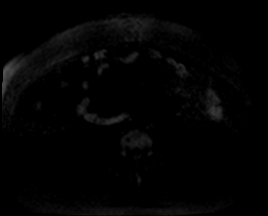

[Series 9: ax dwi_adc · axial · 5.0mm · 1.34mm/px · 1 of 34 slices shown]
[im 1/34]
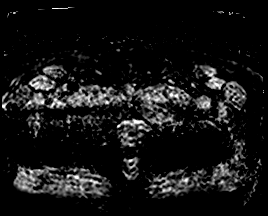

[Series 10: T1 dynamic · axial · 4.0mm · 0.75mm/px · z∈[-35,+185]mm · 2 of 56 slices shown (1 of 14)]
[im 1/56]
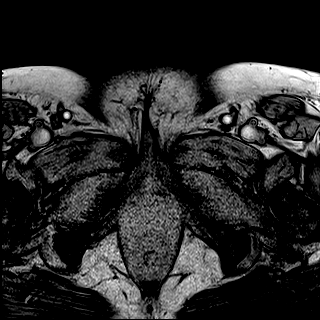
[im 56/56]
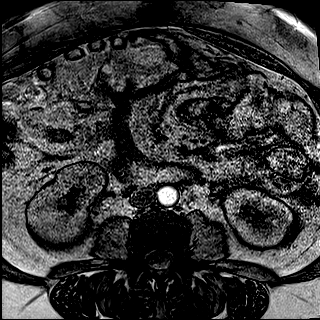

[Series 11: T1 dynamic · axial · 4.0mm · 0.75mm/px · z∈[-35,+185]mm · 2 of 56 slices shown (2 of 14)]
[im 1/56]
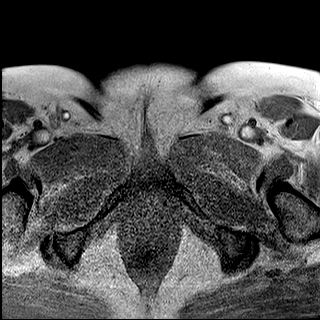
[im 56/56]
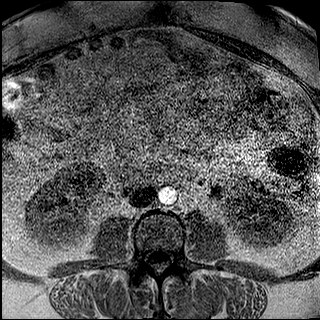

[Series 13: T1 dynamic · axial · 4.0mm · 0.75mm/px · z∈[-35,+185]mm · 2 of 56 slices shown (3 of 14)]
[im 1/56]
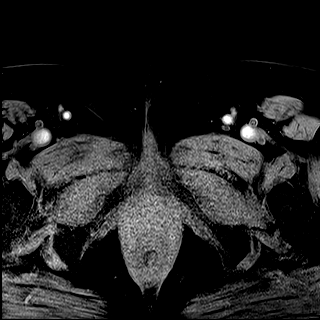
[im 56/56]
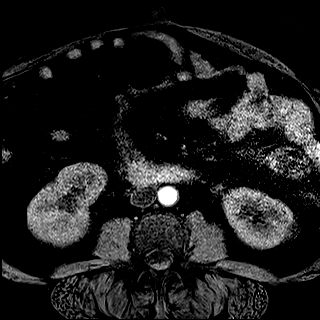

[Series 14: T1 dynamic · axial · 4.0mm · 0.75mm/px · z∈[-35,+185]mm · 2 of 56 slices shown (4 of 14)]
[im 1/56]
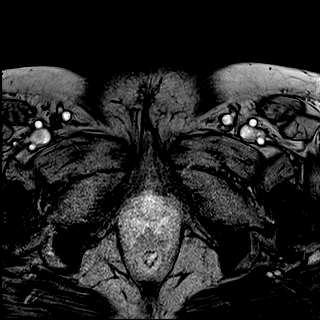
[im 56/56]
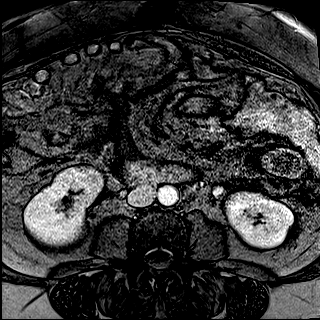

[Series 16: T1 dynamic · axial · 4.0mm · 0.75mm/px · z∈[-35,+185]mm · 2 of 53 slices shown (5 of 14)]
[im 1/53]
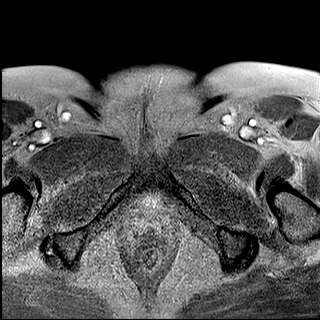
[im 53/53]
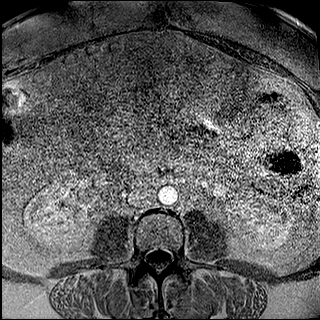

[Series 20: T1 dynamic · axial · 4.0mm · 0.75mm/px · z∈[-35,+185]mm · 2 of 56 slices shown (6 of 14)]
[im 1/56]
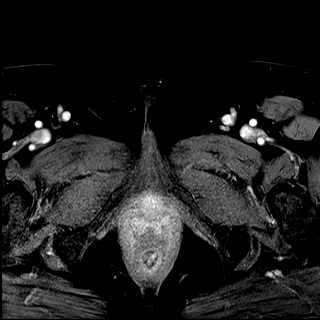
[im 56/56]
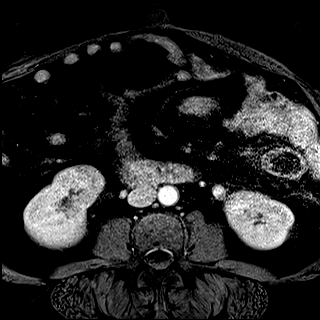

[Series 21: T1 dynamic · axial · 4.0mm · 0.75mm/px · z∈[-35,+185]mm · 2 of 56 slices shown (7 of 14)]
[im 1/56]
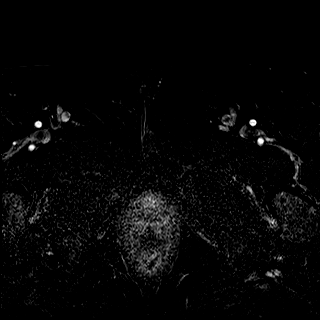
[im 56/56]
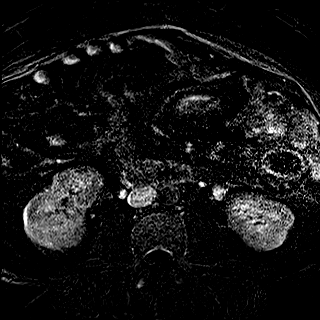

[Series 22: T1 dynamic · axial · 4.0mm · 0.75mm/px · z∈[-35,+185]mm · 2 of 55 slices shown (8 of 14)]
[im 1/55]
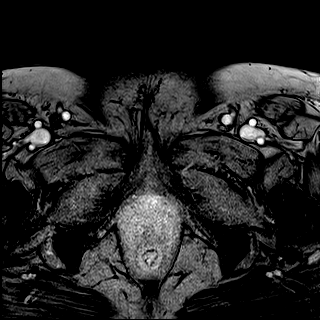
[im 55/55]
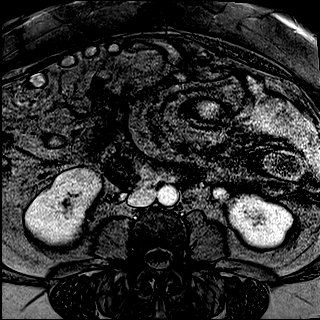

[Series 24: T1 dynamic · axial · 4.0mm · 0.75mm/px · z∈[-35,+185]mm · 2 of 56 slices shown (9 of 14)]
[im 1/56]
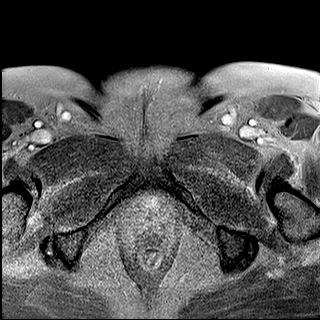
[im 56/56]
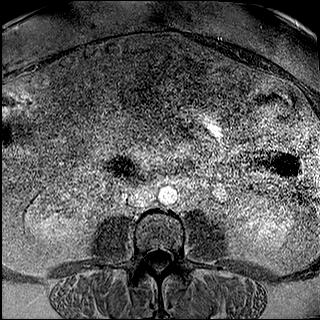

[Series 28: T1 dynamic · axial · 4.0mm · 0.75mm/px · z∈[-35,+185]mm · 2 of 56 slices shown (10 of 14)]
[im 1/56]
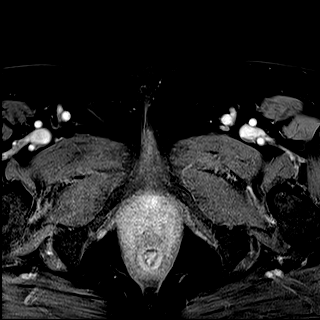
[im 56/56]
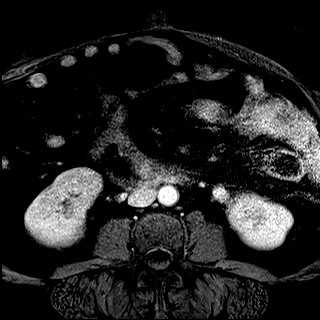

[Series 29: T1 dynamic · axial · 4.0mm · 0.75mm/px · z∈[-35,+185]mm · 2 of 56 slices shown (11 of 14)]
[im 1/56]
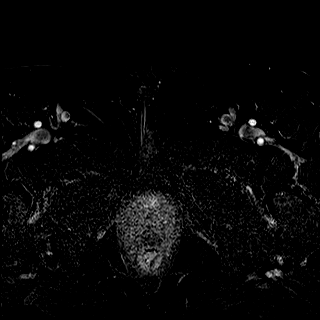
[im 56/56]
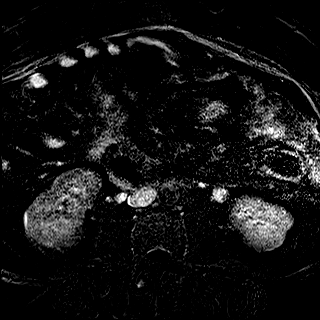

[Series 32: T1 dynamic · axial · 4.0mm · 0.75mm/px · z∈[-35,+185]mm · 2 of 56 slices shown (12 of 14)]
[im 1/56]
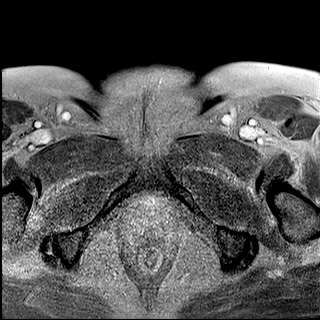
[im 56/56]
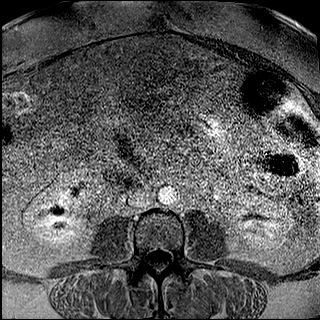

[Series 36: T1 dynamic · axial · 4.0mm · 0.75mm/px · z∈[-35,+185]mm · 2 of 56 slices shown (13 of 14)]
[im 1/56]
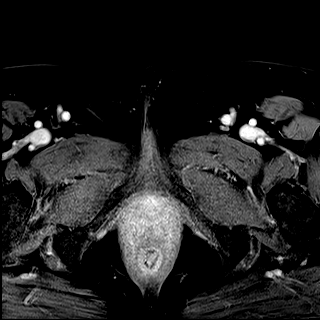
[im 56/56]
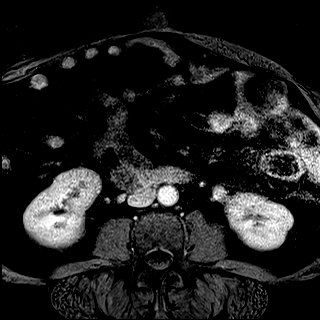

[Series 37: T1 dynamic · axial · 4.0mm · 0.75mm/px · z∈[-35,+185]mm · 2 of 56 slices shown (14 of 14)]
[im 1/56]
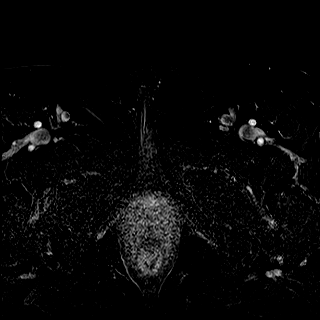
[im 56/56]
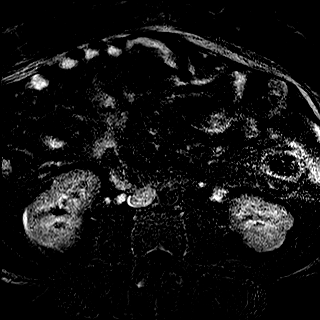

[Series 40: T1 fat-sat post-contrast · sagittal · 3.0mm · 0.88mm/px · 1 of 74 slices shown]
[im 1/74]
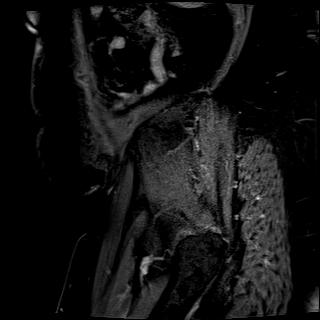

[42 of 48 positions shown; findings below may reference images not displayed]

FINDINGS: Lower Urinary Tract: No urinary bladder or urethral abnormality
identified.

Bowel: Unremarkable pelvic bowel loops.

Vascular/Lymphatic: Unremarkable. No pathologically enlarged pelvic
lymph nodes identified.

Reproductive:

-- Uterus: Measures 19.3 x 10.3 by 14.2 cm (volume = [OP] cm^3).
This is not significantly changed since previous study when
remeasured in same planes. Innumerable small fibroids are seen
involving the uterus diffusely, which are intramural, subserosal,
and submucosal in location. Largest fibroid is subserosal in
location in the anterior uterine fundus measuring 5.6 cm in
diameter. No significant changes seen compared to prior exam. Cervix
and vagina are unremarkable.

-- Right ovary: Appears normal. No ovarian or adnexal masses
identified.

-- Left ovary: Appears normal. No ovarian or adnexal masses
identified.

Other: No peritoneal thickening or abnormal free fluid.

Musculoskeletal:  Unremarkable.
IMPRESSION: Diffuse uterine involvement by innumerable small fibroids, without
significant change since prior study.

No evidence of adnexal mass or other significant abnormality.

## 2022-04-03 MED ORDER — GADOBUTROL 1 MMOL/ML IV SOLN
9.0000 mL | Freq: Once | INTRAVENOUS | Status: AC | PRN
  Administered 2022-04-03: 9 mL via INTRAVENOUS

## 2022-04-08 ENCOUNTER — Encounter: Payer: Self-pay | Admitting: Obstetrics and Gynecology

## 2022-04-08 ENCOUNTER — Inpatient Hospital Stay: Payer: Self-pay

## 2022-04-08 ENCOUNTER — Inpatient Hospital Stay (HOSPITAL_BASED_OUTPATIENT_CLINIC_OR_DEPARTMENT_OTHER): Payer: Self-pay | Admitting: Obstetrics and Gynecology

## 2022-04-08 VITALS — BP 158/86 | HR 66 | Temp 98.7°F | Resp 19 | Wt 207.6 lb

## 2022-04-08 DIAGNOSIS — D259 Leiomyoma of uterus, unspecified: Secondary | ICD-10-CM

## 2022-04-08 NOTE — Progress Notes (Addendum)
Gynecologic Oncology Interval Visit  ? ?Referring Provider: Dr. Rogue Bussing ? ?Chief Concern: abnormal uterine findings on PET scan ? ?Subjective:  ?[name] [name] is a 55 y.o. P2 (1 SVD; 1 C/S) female with hsitory of leiomyoma and cardiac arrest with anoxic brain injury, returns to clinic for follow up for MRI avid uterine masses c/w leiomyoma.  ? ?She was recently diagnosed with stage I colon cancer [id number] s/p partial colectom 03/23/2022. She has seen Dr. Rogue Bussing for recommendations. Results of immunohistochemistry for MMR (mismatch repair proteins) will be resulted in an addendum.  ? ?She says she is on her cycle today and has been bleeding for 5 days. Her last period was 2 months ago and she just had spotting and the one before that was 4 months ago.  ? ?Her [place] was normal on 04/02/2021.  ? ?FSH mIU/mL 6.7   ? ?She has no other complaints.  ? ?04/03/2022 MRI "Diffuse uterine involvement by innumerable small fibroids, without ?significant change since prior study. No evidence of adnexal mass or other significant abnormality." However when comparing reports the size the uterus has increased in size to 19.3 x 10.3 by 14.2 cm (volume = 1480 cm^3) from 13.0 x 9.8 by 13.2 cm (volume = 880 cm^3). ? ? ? ?Gynecologic History:  ?She has a long history of leiomyoma dating all the way back to 2009. She has not been symptomatic. Her cycles are regular x 5-7 days with normal flow, occurring monthly with menarche at age 46. She missed her cycle for ~3 months which she was in the ICU.  Her last Pap is unknown. She has not had a pelvic exam in ~4-5 years per her report.  ? ?February 15, 2012 Last Pelvic US ?Findings:  The uterus is anteverted. It measures 8.9 x 12.0 x 7.3 cm  transabdominally.  ?#1: In the anterior left lateral fundus there is an isoechoic subserosal  fibroid labeled 1, which measures 3.0 x 2.4 x 2.9 cm (previously 2.2 x  1.9 x 2.3 cm).  ?#2: Posterior to structure 1 in the mid-uterine fundus is a hypoechoic   subserosal fibroid labeled 2, which measures 2.5 x 1.7 x 2.7 cm (previously 2.5 x 2.2 x 2.7 cm).  ?#3: In the posterior left uterus adjacent to endometrium with likely  submucosal component there is an isoechoic fibroid, which measures 2.6 x  2.2 x 2.2 cm (does not correspond to #3 on prior).  ?#4: No fibroid was labeled 4.  ?#5: In the posterior uterine body there is an isoechoic heterogeneous  fibroid labeled 5, which measures 3.9 x 3.1 x 4.5 cm (does not correspond  to #5 on prior).  ?#6: In the right uterine fundus there is a isoechoic exophytic subserosal  fibroid labeled 6, which measures 2.7 x 2.4 x 2.7 cm.  ?#7: Inferior to structure 6 in the right uterine fundus is an isoechoic  fibroid, labeled 7, which measures 2.2 x 1.5 x 2.6 cm.  ?#8: In the left posterior body there is an isoechoic intramural fibroid,  labeled 8, which measures 3.4 x 2.7 x 3.4 cm.  ? ?There is a nabothian cyst present in the cervix.  ? ?11/2020 [name] [name] had cardiac arrest [noted to be in V. Fib] s/p ACLS-defibrillation/intubation. Anoxic brain injury-PEG/trach; Her course was complicated by aspiration pneumonia from hospitalization needing tracheostomy/PEG tube placement.  Patient after lengthy hospital stay was discharged to rehab where she subsequently recovered.  As part of her evaluation she had a coronary CT-incidentally picked up  that right lower lobe nodule.   ? ?February 27, 2021- A lobulated solid nodule is seen in the posterior right lower lobe measures 2.2 x 1.9 cm.  ? ?March 20, 2021 This was followed by a PET scan-no significant uptake in the right lower lobe lung nodule; however noted to have uterine mass/uptake -  fibroid versus malignancy ?  ?FINDINGS: ?Mediastinal blood pool activity: SUV max 1.2 ?  ?Liver activity: SUV max NA ?  ?NECK: No areas of abnormal hypermetabolism. ?  ?Incidental CT findings: No cervical adenopathy. Minimal mucosal thickening of bilateral maxillary sinuses. ?  ?CHEST: Low-level right hilar and  infrahilar nodal hypermetabolism without adenopathy is favored to be reactive. The right infrahilar hypermetabolism measures a S.U.V. max of 2.3 including on image 91/3. ?  ?The right lower lobe lung nodule measures 1.9 by 1.9 cm and is not significantly  hpermetabolic, including at a S.U.V. max of 1.5. This measured 2.2 x 1.9 cm on the prior chest CT. ?  ?A dominant posterior left upper lobe pulmonary nodule measures 1.1 cm and a S.U.V. max of 1.1 on 67/3. ?  ?Incidental CT findings: Smaller bilateral pulmonary nodules as on prior chest CT. ?  ?ABDOMEN/PELVIS: An aortocaval node measures 5 mm a S.U.V. max of 3.3 on 156/3. ?  ?Left adrenal hypermetabolism laterally corresponds to low-density nodularity, favoring an adenoma. Example 1.3 cm and s.U.V. Max of 3.4. ?  ?Uterine enlargement and heterogeneity with 2 hypermetabolic lesions within. Example at ar S.U.V. max of 15.4 within the right-side of the uterine body/fundus. ?  ?Incidental CT findings: No renal calculi or hydronephrosis. Aortic atherosclerosis. Mild pelvic floor laxity. ?  ?SKELETON: Likely degenerative hypermetabolism about both shoulders. There is also hypermetabolism about the caudal portion of the medial right clavicle, in the region of the anterior first right rib. No CT correlate. Example at a S.U.V. max of 2.9. ?  ?Incidental CT findings: none ?  ?IMPRESSION: ?1. Bilateral pulmonary nodules, including the dominant right lower lobe pulmonary nodule, which are not significantly FDG avid. Considerations include non FDG avid metastasis or ?infectious/inflammatory process. Potential clinical strategies include tissue sampling versus 3 month follow-up with CT. ?2. Uterine enlargement heterogeneity, with hypermetabolic lesions within. Most likely fibroids. Given the pulmonary nodules, leiomyosarcoma(s) or metastasizing benign leiomyomas are ?considerations. ?3. Small abdominal retroperitoneal hypermetabolic node. Most likely benign/reactive. If the  patient is diagnosed with metastatic disease, nodal metastasis would be considered. ?4. Right chest wall hypermetabolism adjacent the caudal portion of the medial clavicle. Most likely degenerative. The clinical history on cardiac CTA describes cardiac arrest. CT occult injury from CPR could look similar. ? ? ?She was discussed at Tumor Board to evaluate lung findings:  Unchanged appearance of bilobed right lower lobe solid pulmonary nodules (1.8 cm), left upper lobe solid pulmonary nodule (1.4 cm), and diffuse scattered less than 6 mm solid pulmonary nodules.  ? ?She is followed by lung nodule clinic.  ? ?04/02/2021 she had a normal FSH 6.7 and negative Pap NIML/HRHPV negative.  ? ?04/08/2021 pelvic MRI ? Reproductive: Uterus: Measures 13.0 x 9.8 by 13.2 cm (volume = 880 ?cm^3). There are multiple uterine fibroids, too numerous to count. ?The FDG avid fibroids within the posterior lower uterine segment and ?anterior lower uterine segment fibroids are intramural, submucosal, ?and subserosal  ?IMPRESSION: ?1. Enlarged uterus with uterine fibroids which are too numerous to ?count. There are no distinguishing characteristics between the FDG ?avid and non FDG avid fibroids identified on recent PET-CT. The FDG ?avid fibroid  within the left anterior lower uterine segment exhibits ?relative hypoenhancement on the postcontrast images. However, the ?FDG avid fibroid within the posterior lower uterine segment exhibits ?relative uniform enhancement on the postcontrast images. If there is ?a clinical concern for leiomyosarcoma or metastasizing benign ?leiomyomas correlation with tissue sampling is recommended. ?2. No acute findings within the pelvis. ?3. Diastasis recti with ventral herniation of the anterior fundal ?fibroid. ?  ? ? ?She presents today for evaluation. She accompanied by her husband. 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 They had two children - a boy and a girl.  ?  ? ?Problem  List: ?Patient Active Problem List  ? Diagnosis Date Noted  ? Uterine leiomyoma 04/08/2022  ? 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 Tubular adenoma of colon 03/23/2022  ? Multiple lung nodules 03/27/2021  ?

## 2022-04-09 LAB — FOLLICLE STIMULATING HORMONE: FSH: 28.8 m[IU]/mL

## 2022-04-10 ENCOUNTER — Telehealth: Payer: Self-pay

## 2022-04-10 NOTE — Telephone Encounter (Signed)
Called and left voicemail with Ms. Bizzarro to return call to go over of [facility] and recommendations per Beckey Rutter, NP

## 2022-04-13 ENCOUNTER — Telehealth: Payer: Self-pay

## 2022-04-13 NOTE — Telephone Encounter (Signed)
Called and reviewed results of [place] and recommendation for endometrial biopsy with [name] [name]. She is agreeable and appointment has been arranged.

## 2022-04-29 ENCOUNTER — Inpatient Hospital Stay: Payer: Self-pay | Attending: Internal Medicine | Admitting: Obstetrics and Gynecology

## 2022-04-29 VITALS — BP 172/98 | HR 63 | Temp 98.7°F | Resp 20 | Wt 207.3 lb

## 2022-04-29 DIAGNOSIS — Z79899 Other long term (current) drug therapy: Secondary | ICD-10-CM | POA: Insufficient documentation

## 2022-04-29 DIAGNOSIS — R918 Other nonspecific abnormal finding of lung field: Secondary | ICD-10-CM

## 2022-04-29 DIAGNOSIS — D259 Leiomyoma of uterus, unspecified: Secondary | ICD-10-CM

## 2022-04-29 DIAGNOSIS — Z7189 Other specified counseling: Secondary | ICD-10-CM

## 2022-04-29 DIAGNOSIS — N950 Postmenopausal bleeding: Secondary | ICD-10-CM

## 2022-04-29 DIAGNOSIS — C186 Malignant neoplasm of descending colon: Secondary | ICD-10-CM

## 2022-04-29 NOTE — Progress Notes (Signed)
Gynecologic Oncology Interval Visit   Referring Provider: Dr. Rogue [name]  Chief Concern: abnormal uterine findings on PET scan  Subjective:  [name] [name] is a 56 y.o. P2 (1 SVD; 1 C/S) female with hsitory of leiomyoma and cardiac arrest with anoxic brain injury, returns to clinic for discussion of blood work and consideration of endometrial biopsy.   [place] elevated to 28.8 which is postmenopausal based on lab range  04/03/2022 MRI "Diffuse uterine involvement by innumerable small fibroids, without significant change since prior study. No evidence of adnexal mass or other significant abnormality." However when comparing reports the size the uterus has increased in size to 19.3 x 10.3 by 14.2 cm (volume = 1480 cm^3) from 13.0 x 9.8 by 13.2 cm (volume = 880 cm^3). MRI findings reviewed with Dr. Kris [name] today and he confirmed the uterine mass with the leiomyomas was stable in size.   Gynecologic History:  She has a long history of leiomyoma dating all the way back to 2009. She has not been symptomatic. Her cycles are regular x 5-7 days with normal flow, occurring monthly with menarche at age 67. She missed her cycle for ~3 months which she was in the ICU.  Her last Pap is unknown. She has not had a pelvic exam in ~4-5 years per her report.   February 15, 2012 Last Pelvic US Findings:  The uterus is anteverted. It measures 8.9 x 12.0 x 7.3 cm  transabdominally.  #1: In the anterior left lateral fundus there is an isoechoic subserosal  fibroid labeled 1, which measures 3.0 x 2.4 x 2.9 cm (previously 2.2 x  1.9 x 2.3 cm).  #2: Posterior to structure 1 in the mid-uterine fundus is a hypoechoic  subserosal fibroid labeled 2, which measures 2.5 x 1.7 x 2.7 cm (previously 2.5 x 2.2 x 2.7 cm).  #3: In the posterior left uterus adjacent to endometrium with likely  submucosal component there is an isoechoic fibroid, which measures 2.6 x  2.2 x 2.2 cm (does not correspond to #3 on prior).  #4: No fibroid was  labeled 4.  #5: In the posterior uterine body there is an isoechoic heterogeneous  fibroid labeled 5, which measures 3.9 x 3.1 x 4.5 cm (does not correspond  to #5 on prior).  #6: In the right uterine fundus there is a isoechoic exophytic subserosal  fibroid labeled 6, which measures 2.7 x 2.4 x 2.7 cm.  #7: Inferior to structure 6 in the right uterine fundus is an isoechoic  fibroid, labeled 7, which measures 2.2 x 1.5 x 2.6 cm.  #8: In the left posterior body there is an isoechoic intramural fibroid,  labeled 8, which measures 3.4 x 2.7 x 3.4 cm.   There is a nabothian cyst present in the cervix.   11/2020 [name] [name] had cardiac arrest [noted to be in V. Fib] s/p ACLS-defibrillation/intubation. Anoxic brain injury-PEG/trach; Her course was complicated by aspiration pneumonia from hospitalization needing tracheostomy/PEG tube placement.  Patient after lengthy hospital stay was discharged to rehab where she subsequently recovered.  As part of her evaluation she had a coronary CT-incidentally picked up that right lower lobe nodule.    February 27, 2021- A lobulated solid nodule is seen in the posterior right lower lobe measures 2.2 x 1.9 cm.   March 20, 2021 This was followed by a PET scan-no significant uptake in the right lower lobe lung nodule; however noted to have uterine mass/uptake -  fibroid versus malignancy   FINDINGS: Mediastinal blood  pool activity: SUV max 1.2   Liver activity: SUV max NA   NECK: No areas of abnormal hypermetabolism.   Incidental CT findings: No cervical adenopathy. Minimal mucosal thickening of bilateral maxillary sinuses.   CHEST: Low-level right hilar and infrahilar nodal hypermetabolism without adenopathy is favored to be reactive. The right infrahilar hypermetabolism measures a S.U.V. max of 2.3 including on image 91/3.   The right lower lobe lung nodule measures 1.9 by 1.9 cm and is not significantly  hpermetabolic, including at a S.U.V. max of 1.5. This measured  2.2 x 1.9 cm on the prior chest CT.   A dominant posterior left upper lobe pulmonary nodule measures 1.1 cm and a S.U.V. max of 1.1 on 67/3.   Incidental CT findings: Smaller bilateral pulmonary nodules as on prior chest CT.   ABDOMEN/PELVIS: An aortocaval node measures 5 mm a S.U.V. max of 3.3 on 156/3.   Left adrenal hypermetabolism laterally corresponds to low-density nodularity, favoring an adenoma. Example 1.3 cm and s.U.V. Max of 3.4.   Uterine enlargement and heterogeneity with 2 hypermetabolic lesions within. Example at ar S.U.V. max of 15.4 within the right-side of the uterine body/fundus.   Incidental CT findings: No renal calculi or hydronephrosis. Aortic atherosclerosis. Mild pelvic floor laxity.   SKELETON: Likely degenerative hypermetabolism about both shoulders. There is also hypermetabolism about the caudal portion of the medial right clavicle, in the region of the anterior first right rib. No CT correlate. Example at a S.U.V. max of 2.9.   Incidental CT findings: none   IMPRESSION: 1. Bilateral pulmonary nodules, including the dominant right lower lobe pulmonary nodule, which are not significantly FDG avid. Considerations include non FDG avid metastasis or infectious/inflammatory process. Potential clinical strategies include tissue sampling versus 3 month follow-up with CT. 2. Uterine enlargement heterogeneity, with hypermetabolic lesions within. Most likely fibroids. Given the pulmonary nodules, leiomyosarcoma(s) or metastasizing benign leiomyomas are considerations. 3. Small abdominal retroperitoneal hypermetabolic node. Most likely benign/reactive. If the patient is diagnosed with metastatic disease, nodal metastasis would be considered. 4. Right chest wall hypermetabolism adjacent the caudal portion of the medial clavicle. Most likely degenerative. The clinical history on cardiac CTA describes cardiac arrest. CT occult injury from CPR could look similar.   She was  discussed at Tumor Board to evaluate lung findings:  Unchanged appearance of bilobed right lower lobe solid pulmonary nodules (1.8 cm), left upper lobe solid pulmonary nodule (1.4 cm), and diffuse scattered less than 6 mm solid pulmonary nodules.   She is followed by lung nodule clinic.   04/02/2021 she had a normal FSH 6.7 and negative Pap NIML/HRHPV negative.   04/08/2021 pelvic MRI  Reproductive: Uterus: Measures 13.0 x 9.8 by 13.2 cm (volume = 880 cm^3). There are multiple uterine fibroids, too numerous to count. The FDG avid fibroids within the posterior lower uterine segment and anterior lower uterine segment fibroids are intramural, submucosal, and subserosal  IMPRESSION: 1. Enlarged uterus with uterine fibroids which are too numerous to count. There are no distinguishing characteristics between the FDG avid and non FDG avid fibroids identified on recent PET-CT. The FDG avid fibroid within the left anterior lower uterine segment exhibits relative hypoenhancement on the postcontrast images. However, the FDG avid fibroid within the posterior lower uterine segment exhibits relative uniform enhancement on the postcontrast images. If there is a clinical concern for leiomyosarcoma or metastasizing benign leiomyomas correlation with tissue sampling is recommended. 2. No acute findings within the pelvis. 3. Diastasis recti with ventral herniation of  the anterior fundal fibroid.     She presents today for evaluation. She accompanied by her husband. 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 They had two children - a boy and a girl.     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   Tubular adenoma of colon 03/23/2022   Multiple lung nodules 03/27/2021   Dysarthria and anarthria 02/11/2021   Sinus tachycardia    Labile blood glucose    Dysphagia     Seizure prophylaxis    Malnutrition of moderate degree 01/23/2021   Anoxic brain injury ([place]) 01/10/2021   Prediabetes    Dyslipidemia    S/P percutaneous endoscopic gastrostomy (PEG) tube placement [facility])    Status post tracheostomy ([place])    Aspiration pneumonia of both lower lobes due to gastric secretions ([place]) 01/05/2021   Acute respiratory failure (HCC)    Hypoxic ischemic encephalopathy    Endotracheal tube present    Cardiac arrest ([place]) 12/02/2020   Essential hypertension 12/09/2011    Past Medical History: Past Medical History:  Diagnosis Date   Anemia    Anoxic brain injury ([place]) 11/2020   a.) following V-fib cardiac arrest. b.) Extended hospital admission x 5 weeks for anoxic encephalopathy requiring tracheostomy and PEG tube placement   Cardiac arrest with ventricular fibrillation ([place]) 12/02/2020   a.) found down at home "gurgling" and apneic; downtime unknown.  911 called and CPR was started.  EMS arrived and patient found to be in ventricular fibrillation; defibrillated x2 and epinephrine given.  Severe hypokalemia and DKA; K+ 2.1, CO2 20, lactic acid 10, gap 21.   Cognitive communication deficit    Colonic mass 02/06/2022   a.) Bx (+) for high-grade dysplasia within adenoma; resection recommended.   History of 2019 novel coronavirus disease (COVID-19) 06/2021   History of respiratory failure 12/02/2020   Hyperlipidemia    Hypertension    Hypokalemia    a.) avoid diuretic medications per cardiology   IDA (iron deficiency anemia)    Internal hemorrhoids    Multiple lung nodules on CT    a.) followed by [facility] lung cancer screening team   Seizures ([place])    T2DM (type 2 diabetes mellitus) ([place])    Tubular adenoma of colon 02/06/2022   a.) Bx (-) for high-grade dysplasia or malignancy   Uterine leiomyoma     Past Surgical History: Past Surgical History:  Procedure Laterality Date   COLONOSCOPY WITH PROPOFOL N/A 02/06/2022   Procedure: COLONOSCOPY WITH PROPOFOL;   Surgeon: Lesly Rubenstein, MD;  Location: [facility] ENDOSCOPY;  Service: Endoscopy;  Laterality: N/A;   PEG PLACEMENT N/A 12/13/2020   Procedure: PERCUTANEOUS ENDOSCOPIC GASTROSTOMY (PEG) PLACEMENT;  Surgeon: Lesly Rubenstein, MD;  Location: [facility] ENDOSCOPY;  Service: Endoscopy;  Laterality: N/A;   ROBOT ASSISTED SIGMOID COLECTOMY   03/23/2022   TRACHEOSTOMY TUBE PLACEMENT N/A 12/13/2020   Procedure: TRACHEOSTOMY;  Surgeon: Clyde Canterbury, MD;  Location: [facility] ORS;  Service: ENT;  Laterality: N/A;   UMBILICAL HERNIA REPAIR N/A 03/23/2022   Procedure: HERNIA REPAIR UMBILICAL ADULT;  Surgeon: Herbert Pun, MD;  Location: [facility] ORS;  Service: General;  Laterality: N/A;    Past Gynecologic History:  Menarche: 12 Menstrual details: as per HPI Menses regular: Yes History of Abnormal pap:No Last pap: obtained 04/02/2021 Contraception: None - uses withdrawal method Sexually active: no not at this time since  her illness  OB History: G10P2 (1SVD - preterm; 1 C/S) OB History  Gravida Para Term Preterm AB Living  _0 SAB IAB Ectopic Multiple Live Births  8            # Outcome Date GA Lbr Len/2nd Weight Sex Delivery Anes PTL Lv  10 SAB           9 SAB           8 SAB           7 SAB           6 SAB           5 SAB           4 SAB           3 SAB           2 Para           1 Para             Family History: Family History  Problem Relation Age of Onset   Hypertension Mother    Leukemia Mother    Diabetes Mother    Healthy Father     Social History: Social History   Socioeconomic History   Marital status: Married    Spouse name: Not on file   Number of children: Not on file   Years of education: Not on file   Highest education level: Not on file  Occupational History   Not on file  Tobacco Use   Smoking status: Former    Types: Cigarettes    Quit date: 2001    Years since quitting: 22.4   Smokeless tobacco: Never  Vaping Use   Vaping Use: Never used   Substance and Sexual Activity   Alcohol use: No   Drug use: Never   Sexual activity: Not on file  Other Topics Concern   Not on file  Social History Narrative   QUIT smoke [2001]; no alcohol; lives in [place] with husband; 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    Social Determinants of Health   Financial Resource Strain: Not on file  Food Insecurity: Not on file  Transportation Needs: Not on file  Physical Activity: Not on file  Stress: Not on file  Social Connections: Not on file  Intimate Partner Violence: Not on file    Allergies: No Known Allergies  Current Medications: Current Outpatient Medications  Medication Sig Dispense Refill   amLODipine (NORVASC) 10 MG tablet Take 10 mg by mouth daily.     atorvastatin (LIPITOR) 80 MG tablet Take 1 tablet (80 mg total) by mouth daily. (Patient taking differently: Take 80 mg by mouth 2 (two) times daily.) 30 tablet 5   carvedilol (COREG) 12.5 MG tablet Take 1 tablet (12.5 mg total) by mouth 2 (two) times daily. 180 tablet 3   potassium chloride SA (KLOR-CON M20) 20 MEQ tablet Take 2 tablets (40 mEq total) by mouth daily. (Patient taking differently: Take 40 mEq by mouth 2 (two) times daily.) 60 tablet 5   HYDROcodone-acetaminophen (NORCO/VICODIN) 5-325 MG tablet Take 1 tablet by mouth every 4 (four) hours as needed for moderate pain. (Patient not taking: Reported on 04/01/2022) 20 tablet 0   saccharomyces boulardii (FLORASTOR) 250 MG capsule Take 1 capsule (250 mg total) by mouth 2 (two) times daily. (Patient not taking: Reported on 03/16/2022) 60 capsule 0   No current facility-administered medications  for this visit.    Review of Systems General: no complaints  HEENT: no complaints  Lungs: no complaints  Cardiac: no complaints  GI: no complaints  GU: no complaints  Musculoskeletal: no complaints  Extremities: no complaints  Skin: no complaints  Neuro: no complaints  Endocrine: no complaints  Psych: no complaints        Objective:  Physical Examination:  BP (!) 172/98   Pulse 63   Temp 98.7 F (37.1 C)   Resp 20   Wt 207 lb 4.8 oz (94 kg)   SpO2 100%   BMI 39.17 kg/m    ECOG Performance Status: 1 - Symptomatic but completely ambulatory  GENERAL: Patient is a well appearing female in no acute distress. Accompanied by daughter. NEURO:  Nonfocal. Well oriented.  Appropriate affect.  Pelvic: deferred from prior visit EGBUS: no lesions Cervix: no lesions, nontender, mobile Vagina: no lesions, no discharge or bleeding Uterus: enlarged 16 week size; nontender, limited mobility but parameter free bilaterally. Adnexa: no palpable masses Rectovaginal: deferred   Radiologic Imaging:  04/08/2021 MRI imaging:  EXAM:  MRI PELVIS WITHOUT AND WITH CONTRAST   TECHNIQUE: Multiplanar multisequence MR imaging of the pelvis was performed both before and after administration of intravenous contrast.   CONTRAST:  44m GADAVIST GADOBUTROL 1 MMOL/ML IV SOLN   COMPARISON:  PET-CT from 03/19/2021   FINDINGS: Urinary Tract:  Urinary bladder is within normal limits.   Bowel:  Unremarkable visualized pelvic bowel loops.   Vascular/Lymphatic: Patent vasculature. No aneurysm identified. No pelvic adenopathy. Right pelvic sidewall lymph node is upper limits of normal measuring 9 mm previously 8 mm, image 31/21.   Reproductive: Uterus: Measures 13.0 x 9.8 by 13.2 cm (volume = 880 cm^3). There are multiple uterine fibroids, too numerous to count. The FDG avid fibroids within the posterior lower uterine segment and anterior lower uterine segment fibroids are intramural, submucosal, and subserosal including:   -FDG avid fibroid within the left anterior lower uterine segment measures 2.3 x 2.5 by 2.3 cm, image 32/17 and image 46/27. This exhibits T2 hypointensity, T1 isointensity with heterogeneous hypoenhancement on the postcontrast images.   -Smaller FDG avid fibroid within the posterior lower uterine  segment is T2 hypointense, T1 isointense and exhibits avid enhancement on the postcontrast images.   -not significantly FDG avid is the dominant fibroid within the left side of fundus with a long axis of 6.4 cm, image 18/14. This exhibits uniform enhancement on the postcontrast images.   -also non FDG avid is a exophytic subserosal fibroid arising off the anterior fundus measures 4 cm, image 36/27. this also exhibits relative uniform enhancement on the postcontrast images.   Endometrium: Measures 5 mm, image 18/28. No focal abnormality aside from submucosal fibroids.   Right ovary: Measures 4.4 x 3.5 by 3.9 cm (volume = 31 cm^3). Multiple prominent cysts are identified. The largest measures 2.8 cm, image 26/2.   Left ovary: Measures 4.9 x 1.6 by 2.4 cm (volume = 9.9 cm^3). Prominent follicles are noted. The largest measures 8 mm, image 13/3.   Other: Trace free fluid noted within the cul-de-sac. No discrete fluid collections.   Musculoskeletal: Diastasis recti is identified with ventral herniation of anterior fundal fibroid, image 9/3.   IMPRESSION: 1. Enlarged uterus with uterine fibroids which are too numerous to count. There are no distinguishing characteristics between the FDG avid and non FDG avid fibroids identified on recent PET-CT. The FDG avid fibroid within the left anterior lower uterine segment exhibits relative  hypoenhancement on the postcontrast images. However, the FDG avid fibroid within the posterior lower uterine segment exhibits relative uniform enhancement on the postcontrast images. If there is a clinical concern for leiomyosarcoma or metastasizing benign leiomyomas correlation with tissue sampling is recommended. 2. No acute findings within the pelvis. 3. Diastasis recti with ventral herniation of the anterior fundal fibroid.     04/06/2022 EXAM: MRI PELVIS WITHOUT AND WITH CONTRAST   TECHNIQUE: Multiplanar multisequence MR imaging of the pelvis  was performed both before and after administration of intravenous contrast.   CONTRAST:  18m GADAVIST GADOBUTROL 1 MMOL/ML IV SOLN   COMPARISON:  04/08/2021   FINDINGS: Lower Urinary Tract: No urinary bladder or urethral abnormality identified.   Bowel: Unremarkable pelvic bowel loops.   Vascular/Lymphatic: Unremarkable. No pathologically enlarged pelvic lymph nodes identified.   Reproductive:   -- Uterus: Measures 19.3 x 10.3 by 14.2 cm (volume = 1480 cm^3). This is not significantly changed since previous study when remeasured in same planes. Innumerable small fibroids are seen involving the uterus diffusely, which are intramural, subserosal, and submucosal in location. Largest fibroid is subserosal in location in the anterior uterine fundus measuring 5.6 cm in diameter. No significant changes seen compared to prior exam. Cervix and vagina are unremarkable.   -- Right ovary: Appears normal. No ovarian or adnexal masses identified.   -- Left ovary: Appears normal. No ovarian or adnexal masses identified.   Other: No peritoneal thickening or abnormal free fluid.   Musculoskeletal:  Unremarkable.   IMPRESSION: Diffuse uterine involvement by innumerable small fibroids, without significant change since prior study.   No evidence of adnexal mass or other significant abnormality.       Assessment:  AIVIE SAVITTis a 56y.o. female diagnosed with PET avid uterine masses c/w leiomyoma, overall stable on MRI report but increased size. Fibroids can be PET avid (Ma et al [facility] 2016). Vaginal bleeding is concerning given elevated FSH and represent postmenopausal bleeding.   History of mild anemia, MCV normal, asymptomatic  Medical co-morbidities complicating care: H/o cardiac arrest and anoxic brain injury.  Plan:   Problem List Items Addressed This Visit       Respiratory   Multiple lung nodules     Digestive   Cancer of descending colon ([facility]      Genitourinary   Uterine leiomyoma - Primary   Other Visit Diagnoses     Postmenopausal bleeding       Counseling and coordination of care          [facility] and recommended EMB[facility]. She is not ready to have the biopsy today. We reviewed how the procedure is done and risks which include infection, pain/discomfort, uterine perforation, bowel/bladder injury. She will reschedule another appt to have her EMBx completed.   Follow up MMR test results as that may alter management. We submitted a formal request to pathology for MMR testing.   The patient's diagnosis, an outline of the further diagnostic and laboratory studies which will be required, the recommendation, and alternatives were discussed.  All questions were answered to the patient's satisfaction.  I personally had a face to face interaction and evaluated the patient jointly with the NP, Ms. LBeckey Rutter  I have reviewed her history and available records and have performed the key portions of the physical exam including with my findings confirming those documented above by the APP.  I have discussed the case with the APP and the patient.  I agree with the above documentation, assessment and plan which was fully formulated by me.  Counseling was completed by me.   A total of 15 minutes were spent with the patient/family today; >50% was spent in education, counseling and coordination of care for PET avid uterine masses c/w leiomyoma and postmenopausal bleeding.  Juhi Lagrange Gaetana Michaelis, MD

## 2022-05-05 LAB — SURGICAL PATHOLOGY

## 2022-06-17 ENCOUNTER — Inpatient Hospital Stay: Payer: Self-pay | Attending: Internal Medicine | Admitting: Obstetrics and Gynecology

## 2022-06-17 VITALS — BP 149/77 | HR 98 | Temp 98.7°F | Resp 20 | Wt 211.3 lb

## 2022-06-17 DIAGNOSIS — R918 Other nonspecific abnormal finding of lung field: Secondary | ICD-10-CM | POA: Insufficient documentation

## 2022-06-17 DIAGNOSIS — E785 Hyperlipidemia, unspecified: Secondary | ICD-10-CM | POA: Insufficient documentation

## 2022-06-17 DIAGNOSIS — Z7189 Other specified counseling: Secondary | ICD-10-CM

## 2022-06-17 DIAGNOSIS — D259 Leiomyoma of uterus, unspecified: Secondary | ICD-10-CM | POA: Insufficient documentation

## 2022-06-17 DIAGNOSIS — C186 Malignant neoplasm of descending colon: Secondary | ICD-10-CM | POA: Insufficient documentation

## 2022-06-17 DIAGNOSIS — I10 Essential (primary) hypertension: Secondary | ICD-10-CM | POA: Insufficient documentation

## 2022-06-17 DIAGNOSIS — N950 Postmenopausal bleeding: Secondary | ICD-10-CM | POA: Insufficient documentation

## 2022-06-17 DIAGNOSIS — Z79899 Other long term (current) drug therapy: Secondary | ICD-10-CM | POA: Insufficient documentation

## 2022-06-17 DIAGNOSIS — E119 Type 2 diabetes mellitus without complications: Secondary | ICD-10-CM | POA: Insufficient documentation

## 2022-06-17 NOTE — Progress Notes (Signed)
Gynecologic Oncology Interval Visit   Referring Provider: Dr. Rogue [name]  Chief Concern: abnormal uterine findings on PET scan  Subjective:  [name] [name] is a 56 y.o. P2 (1 SVD; 1 C/S) female with hsitory of leiomyoma and cardiac arrest with anoxic brain injury, returns to clinic for discussion of blood work and consideration of endometrial biopsy.   [place] elevated to 28.8 which is postmenopausal based on lab range  04/03/2022 MRI "Diffuse uterine involvement by innumerable small fibroids, without significant change since prior study. No evidence of adnexal mass or other significant abnormality." However when comparing reports the size the uterus has increased in size to 19.3 x 10.3 by 14.2 cm (volume = 1480 cm^3) from 13.0 x 9.8 by 13.2 cm (volume = 880 cm^3). MRI findings reviewed with Dr. Kris [name] today and he confirmed the uterine mass with the leiomyomas was stable in size.  FSH was consistent with post-menopausal  Patient was seen on 04/29/22. She declined biopsy on that day and returns for biopsy today.   MMR was intact.    Gynecologic History:  She has a long history of leiomyoma dating all the way back to 2009. She has not been symptomatic. Her cycles are regular x 5-7 days with normal flow, occurring monthly with menarche at age 56. She missed her cycle for ~3 months which she was in the ICU.  Her last Pap is unknown. She has not had a pelvic exam in ~4-5 years per her report.   February 15, 2012 Last Pelvic US Findings:  The uterus is anteverted. It measures 8.9 x 12.0 x 7.3 cm  transabdominally.  #1: In the anterior left lateral fundus there is an isoechoic subserosal  fibroid labeled 1, which measures 3.0 x 2.4 x 2.9 cm (previously 2.2 x  1.9 x 2.3 cm).  #2: Posterior to structure 1 in the mid-uterine fundus is a hypoechoic  subserosal fibroid labeled 2, which measures 2.5 x 1.7 x 2.7 cm (previously 2.5 x 2.2 x 2.7 cm).  #3: In the posterior left uterus adjacent to endometrium with  likely  submucosal component there is an isoechoic fibroid, which measures 2.6 x  2.2 x 2.2 cm (does not correspond to #3 on prior).  #4: No fibroid was labeled 4.  #5: In the posterior uterine body there is an isoechoic heterogeneous  fibroid labeled 5, which measures 3.9 x 3.1 x 4.5 cm (does not correspond  to #5 on prior).  #6: In the right uterine fundus there is a isoechoic exophytic subserosal  fibroid labeled 6, which measures 2.7 x 2.4 x 2.7 cm.  #7: Inferior to structure 6 in the right uterine fundus is an isoechoic  fibroid, labeled 7, which measures 2.2 x 1.5 x 2.6 cm.  #8: In the left posterior body there is an isoechoic intramural fibroid,  labeled 8, which measures 3.4 x 2.7 x 3.4 cm.   There is a nabothian cyst present in the cervix.   11/2020 [name] [name] had cardiac arrest [noted to be in V. Fib] s/p ACLS-defibrillation/intubation. Anoxic brain injury-PEG/trach; Her course was complicated by aspiration pneumonia from hospitalization needing tracheostomy/PEG tube placement.  Patient after lengthy hospital stay was discharged to rehab where she subsequently recovered.  As part of her evaluation she had a coronary CT-incidentally picked up that right lower lobe nodule.    February 27, 2021- A lobulated solid nodule is seen in the posterior right lower lobe measures 2.2 x 1.9 cm.   March 20, 2021 This was followed  by a PET scan-no significant uptake in the right lower lobe lung nodule; however noted to have uterine mass/uptake -  fibroid versus malignancy   FINDINGS: Mediastinal blood pool activity: SUV max 1.2   Liver activity: SUV max NA   NECK: No areas of abnormal hypermetabolism.   Incidental CT findings: No cervical adenopathy. Minimal mucosal thickening of bilateral maxillary sinuses.   CHEST: Low-level right hilar and infrahilar nodal hypermetabolism without adenopathy is favored to be reactive. The right infrahilar hypermetabolism measures a S.U.V. max of 2.3 including on image  91/3.   The right lower lobe lung nodule measures 1.9 by 1.9 cm and is not significantly  hpermetabolic, including at a S.U.V. max of 1.5. This measured 2.2 x 1.9 cm on the prior chest CT.   A dominant posterior left upper lobe pulmonary nodule measures 1.1 cm and a S.U.V. max of 1.1 on 67/3.   Incidental CT findings: Smaller bilateral pulmonary nodules as on prior chest CT.   ABDOMEN/PELVIS: An aortocaval node measures 5 mm a S.U.V. max of 3.3 on 156/3.   Left adrenal hypermetabolism laterally corresponds to low-density nodularity, favoring an adenoma. Example 1.3 cm and s.U.V. Max of 3.4.   Uterine enlargement and heterogeneity with 2 hypermetabolic lesions within. Example at ar S.U.V. max of 15.4 within the right-side of the uterine body/fundus.   Incidental CT findings: No renal calculi or hydronephrosis. Aortic atherosclerosis. Mild pelvic floor laxity.   SKELETON: Likely degenerative hypermetabolism about both shoulders. There is also hypermetabolism about the caudal portion of the medial right clavicle, in the region of the anterior first right rib. No CT correlate. Example at a S.U.V. max of 2.9.   Incidental CT findings: none   IMPRESSION: 1. Bilateral pulmonary nodules, including the dominant right lower lobe pulmonary nodule, which are not significantly FDG avid. Considerations include non FDG avid metastasis or infectious/inflammatory process. Potential clinical strategies include tissue sampling versus 3 month follow-up with CT. 2. Uterine enlargement heterogeneity, with hypermetabolic lesions within. Most likely fibroids. Given the pulmonary nodules, leiomyosarcoma(s) or metastasizing benign leiomyomas are considerations. 3. Small abdominal retroperitoneal hypermetabolic node. Most likely benign/reactive. If the patient is diagnosed with metastatic disease, nodal metastasis would be considered. 4. Right chest wall hypermetabolism adjacent the caudal portion of the medial  clavicle. Most likely degenerative. The clinical history on cardiac CTA describes cardiac arrest. CT occult injury from CPR could look similar.   She was discussed at Tumor Board to evaluate lung findings:  Unchanged appearance of bilobed right lower lobe solid pulmonary nodules (1.8 cm), left upper lobe solid pulmonary nodule (1.4 cm), and diffuse scattered less than 6 mm solid pulmonary nodules.   She is followed by lung nodule clinic.   04/02/2021 she had a normal FSH 6.7 and negative Pap NIML/HRHPV negative.   04/08/2021 pelvic MRI  Reproductive: Uterus: Measures 13.0 x 9.8 by 13.2 cm (volume = 880 cm^3). There are multiple uterine fibroids, too numerous to count. The FDG avid fibroids within the posterior lower uterine segment and anterior lower uterine segment fibroids are intramural, submucosal, and subserosal  IMPRESSION: 1. Enlarged uterus with uterine fibroids which are too numerous to count. There are no distinguishing characteristics between the FDG avid and non FDG avid fibroids identified on recent PET-CT. The FDG avid fibroid within the left anterior lower uterine segment exhibits relative hypoenhancement on the postcontrast images. However, the FDG avid fibroid within the posterior lower uterine segment exhibits relative uniform enhancement on the postcontrast images. If there is  a clinical concern for leiomyosarcoma or metastasizing benign leiomyomas correlation with tissue sampling is recommended. 2. No acute findings within the pelvis. 3. Diastasis recti with ventral herniation of the anterior fundal fibroid.     She presents today for evaluation. She accompanied by her husband. 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 They had two children - a boy and a girl.     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   Tubular adenoma of  colon 03/23/2022   Multiple lung nodules 03/27/2021   Dysarthria and anarthria 02/11/2021   Sinus tachycardia    Labile blood glucose    Dysphagia    Seizure prophylaxis    Malnutrition of moderate degree 01/23/2021   Anoxic brain injury ([place]) 01/10/2021   Prediabetes    Dyslipidemia    S/P percutaneous endoscopic gastrostomy (PEG) tube placement [facility])    Status post tracheostomy ([place])    Aspiration pneumonia of both lower lobes due to gastric secretions ([place]) 01/05/2021   Acute respiratory failure (HCC)    Hypoxic ischemic encephalopathy    Endotracheal tube present    Cardiac arrest ([place]) 12/02/2020   Essential hypertension 12/09/2011    Past Medical History: Past Medical History:  Diagnosis Date   Anemia    Anoxic brain injury ([place]) 11/2020   a.) following V-fib cardiac arrest. b.) Extended hospital admission x 5 weeks for anoxic encephalopathy requiring tracheostomy and PEG tube placement   Cardiac arrest with ventricular fibrillation ([place]) 12/02/2020   a.) found down at home "gurgling" and apneic; downtime unknown.  911 called and CPR was started.  EMS arrived and patient found to be in ventricular fibrillation; defibrillated x2 and epinephrine given.  Severe hypokalemia and DKA; K+ 2.1, CO2 20, lactic acid 10, gap 21.   Cognitive communication deficit    Colonic mass 02/06/2022   a.) Bx (+) for high-grade dysplasia within adenoma; resection recommended.   History of 2019 novel coronavirus disease (COVID-19) 06/2021   History of respiratory failure 12/02/2020   Hyperlipidemia    Hypertension    Hypokalemia    a.) avoid diuretic medications per cardiology   IDA (iron deficiency anemia)    Internal hemorrhoids    Multiple lung nodules on CT    a.) followed by [facility] lung cancer screening team   Seizures ([place])    T2DM (type 2 diabetes mellitus) ([place])    Tubular adenoma of colon 02/06/2022   a.) Bx (-) for high-grade dysplasia or malignancy   Uterine leiomyoma     Past  Surgical History: Past Surgical History:  Procedure Laterality Date   COLONOSCOPY WITH PROPOFOL N/A 02/06/2022   Procedure: COLONOSCOPY WITH PROPOFOL;  Surgeon: Lesly Rubenstein, MD;  Location: [facility] ENDOSCOPY;  Service: Endoscopy;  Laterality: N/A;   PEG PLACEMENT N/A 12/13/2020   Procedure: PERCUTANEOUS ENDOSCOPIC GASTROSTOMY (PEG) PLACEMENT;  Surgeon: Lesly Rubenstein, MD;  Location: [facility] ENDOSCOPY;  Service: Endoscopy;  Laterality: N/A;   ROBOT ASSISTED SIGMOID COLECTOMY   03/23/2022   TRACHEOSTOMY TUBE PLACEMENT N/A 12/13/2020   Procedure: TRACHEOSTOMY;  Surgeon: Clyde Canterbury, MD;  Location: [facility] ORS;  Service: ENT;  Laterality: N/A;   UMBILICAL HERNIA REPAIR N/A 03/23/2022   Procedure: HERNIA REPAIR UMBILICAL ADULT;  Surgeon: Herbert Pun, MD;  Location: [facility] ORS;  Service: General;  Laterality: N/A;    Past Gynecologic History:  Menarche: 12 Menstrual  details: as per HPI Menses regular: Yes History of Abnormal pap:No Last pap: obtained 04/02/2021 Contraception: None - uses withdrawal method Sexually active: no not at this time since her illness  OB History: G10P2 (1SVD - preterm; 1 C/S) OB History  Gravida Para Term Preterm AB Living  10 2     8     SAB IAB Ectopic Multiple Live Births  8            # Outcome Date GA Lbr Len/2nd Weight Sex Delivery Anes PTL Lv  10 SAB           9 SAB           8 SAB           7 SAB           6 SAB           5 SAB           4 SAB           3 SAB           2 Para           1 Para             Family History: Family History  Problem Relation Age of Onset   Hypertension Mother    Leukemia Mother    Diabetes Mother    Healthy Father     Social History: Social History   Socioeconomic History   Marital status: Married    Spouse name: Not on file   Number of children: Not on file   Years of education: Not on file   Highest education level: Not on file  Occupational History   Not on file  Tobacco Use   Smoking  status: Former    Types: Cigarettes    Quit date: 2001    Years since quitting: 22.5   Smokeless tobacco: Never  Vaping Use   Vaping Use: Never used  Substance and Sexual Activity   Alcohol use: No   Drug use: Never   Sexual activity: Not on file  Other Topics Concern   Not on file  Social History Narrative   QUIT smoke [2001]; no alcohol; lives in [place] with husband; 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    Social Determinants of Health   Financial Resource Strain: Not on file  Food Insecurity: Not on file  Transportation Needs: Not on file  Physical Activity: Not on file  Stress: Not on file  Social Connections: Not on file  Intimate Partner Violence: Not on file    Allergies: No Known Allergies  Current Medications: Current Outpatient Medications  Medication Sig Dispense Refill   potassium chloride SA (KLOR-CON M20) 20 MEQ tablet Take 2 tablets (40 mEq total) by mouth daily. (Patient taking differently: Take 40 mEq by mouth 2 (two) times daily.) 60 tablet 5   amLODipine (NORVASC) 10 MG tablet Take 10 mg by mouth daily.     atorvastatin (LIPITOR) 80 MG tablet Take 1 tablet (80 mg total) by mouth daily. (Patient taking differently: Take 80 mg by mouth 2 (two) times daily.) 30 tablet 5   carvedilol (COREG) 12.5 MG tablet Take 1 tablet (12.5 mg total) by mouth 2 (two) times daily. 180 tablet 3   HYDROcodone-acetaminophen (NORCO/VICODIN) 5-325 MG tablet Take 1 tablet by mouth every 4 (four) hours as needed for moderate pain. (Patient not taking: Reported on 04/01/2022) 20 tablet 0   saccharomyces boulardii (FLORASTOR) 250  MG capsule Take 1 capsule (250 mg total) by mouth 2 (two) times daily. (Patient not taking: Reported on 03/16/2022) 60 capsule 0   No current facility-administered medications for this visit.   Review of Systems General: no complaints  HEENT: no complaints  Lungs: no complaints  Cardiac: no complaints  GI: no complaints  GU: no complaints   Musculoskeletal: no complaints  Extremities: no complaints  Skin: no complaints  Neuro: no complaints  Endocrine: no complaints  Psych: no complaints         Objective:  Physical Examination:  BP (!) 149/77   Pulse 98   Temp 98.7 F (37.1 C)   Resp 20   Wt 211 lb 4.8 oz (95.8 kg)   SpO2 100%   BMI 39.92 kg/m    Performance status: 1  GENERAL: Patient is a well appearing female in no acute distress HEENT:  PERRL, neck supple with midline trachea.  ABDOMEN:  Soft, nontender, nondistended. Small 2 cm umbilical hernia. Pelvic mass c/w fibroid uteruns unable to determine exact size.  EXTREMITIES:  No peripheral edema.   SKIN:  Vertical incision well healed NEURO:  Nonfocal. Well oriented.  Appropriate affect stable c/w prior anoxic injury.  Pelvic: exam chaperoned EGBUS: no lesions Cervix: no lesions, nontender, mobile Vagina: no lesions, no discharge or bleeding Uterus: enlarged unable to determine exact size today; nontender, limited mobility but parameter free bilaterally. Adnexa: no palpable masses Rectovaginal: confirmatory   EMBx performed.   The risks and benefits of the procedure were reviewed and informed consent obtained. Patient competent for consent. Time out was performed. The patient received pre-procedure teaching and expressed understanding. The post-procedure instructions were reviewed with the patient and she expressed understanding. The patient does not have any barriers to learning.  Speculum placed in the vaginal vault and cervix identified. Cervix cleansed with Betadine and anterior lip. The os was entered with the pipelle was inserted to 8 cm. The specimen was obtained without difficult.   Post-procedure evaluation the patient was stable without complaints.   Radiologic Imaging:  04/08/2021 MRI imaging:  EXAM:  MRI PELVIS WITHOUT AND WITH CONTRAST   TECHNIQUE: Multiplanar multisequence MR imaging of the pelvis was performed both before and  after administration of intravenous contrast.   CONTRAST:  87m GADAVIST GADOBUTROL 1 MMOL/ML IV SOLN   COMPARISON:  PET-CT from 03/19/2021   FINDINGS: Urinary Tract:  Urinary bladder is within normal limits.   Bowel:  Unremarkable visualized pelvic bowel loops.   Vascular/Lymphatic: Patent vasculature. No aneurysm identified. No pelvic adenopathy. Right pelvic sidewall lymph node is upper limits of normal measuring 9 mm previously 8 mm, image 31/21.   Reproductive: Uterus: Measures 13.0 x 9.8 by 13.2 cm (volume = 880 cm^3). There are multiple uterine fibroids, too numerous to count. The FDG avid fibroids within the posterior lower uterine segment and anterior lower uterine segment fibroids are intramural, submucosal, and subserosal including:   -FDG avid fibroid within the left anterior lower uterine segment measures 2.3 x 2.5 by 2.3 cm, image 32/17 and image 46/27. This exhibits T2 hypointensity, T1 isointensity with heterogeneous hypoenhancement on the postcontrast images.   -Smaller FDG avid fibroid within the posterior lower uterine segment is T2 hypointense, T1 isointense and exhibits avid enhancement on the postcontrast images.   -not significantly FDG avid is the dominant fibroid within the left side of fundus with a long axis of 6.4 cm, image 18/14. This exhibits uniform enhancement on the postcontrast images.   -also non  FDG avid is a exophytic subserosal fibroid arising off the anterior fundus measures 4 cm, image 36/27. this also exhibits relative uniform enhancement on the postcontrast images.   Endometrium: Measures 5 mm, image 18/28. No focal abnormality aside from submucosal fibroids.   Right ovary: Measures 4.4 x 3.5 by 3.9 cm (volume = 31 cm^3). Multiple prominent cysts are identified. The largest measures 2.8 cm, image 26/2.   Left ovary: Measures 4.9 x 1.6 by 2.4 cm (volume = 9.9 cm^3). Prominent follicles are noted. The largest measures 8 mm,  image 13/3.   Other: Trace free fluid noted within the cul-de-sac. No discrete fluid collections.   Musculoskeletal: Diastasis recti is identified with ventral herniation of anterior fundal fibroid, image 9/3.   IMPRESSION: 1. Enlarged uterus with uterine fibroids which are too numerous to count. There are no distinguishing characteristics between the FDG avid and non FDG avid fibroids identified on recent PET-CT. The FDG avid fibroid within the left anterior lower uterine segment exhibits relative hypoenhancement on the postcontrast images. However, the FDG avid fibroid within the posterior lower uterine segment exhibits relative uniform enhancement on the postcontrast images. If there is a clinical concern for leiomyosarcoma or metastasizing benign leiomyomas correlation with tissue sampling is recommended. 2. No acute findings within the pelvis. 3. Diastasis recti with ventral herniation of the anterior fundal fibroid.     04/06/2022 EXAM: MRI PELVIS WITHOUT AND WITH CONTRAST   TECHNIQUE: Multiplanar multisequence MR imaging of the pelvis was performed both before and after administration of intravenous contrast.   CONTRAST:  64m GADAVIST GADOBUTROL 1 MMOL/ML IV SOLN   COMPARISON:  04/08/2021   FINDINGS: Lower Urinary Tract: No urinary bladder or urethral abnormality identified.   Bowel: Unremarkable pelvic bowel loops.   Vascular/Lymphatic: Unremarkable. No pathologically enlarged pelvic lymph nodes identified.   Reproductive:   -- Uterus: Measures 19.3 x 10.3 by 14.2 cm (volume = 1480 cm^3). This is not significantly changed since previous study when remeasured in same planes. Innumerable small fibroids are seen involving the uterus diffusely, which are intramural, subserosal, and submucosal in location. Largest fibroid is subserosal in location in the anterior uterine fundus measuring 5.6 cm in diameter. No significant changes seen compared to prior exam.  Cervix and vagina are unremarkable.   -- Right ovary: Appears normal. No ovarian or adnexal masses identified.   -- Left ovary: Appears normal. No ovarian or adnexal masses identified.   Other: No peritoneal thickening or abnormal free fluid.   Musculoskeletal:  Unremarkable.   IMPRESSION: Diffuse uterine involvement by innumerable small fibroids, without significant change since prior study.   No evidence of adnexal mass or other significant abnormality.       Assessment:  AELECTA STERRYis a 56y.o. female diagnosed with PET avid uterine masses c/w leiomyoma, overall stable on MRI report but increased size. Fibroids can be PET avid (Ma et al [facility] 2016). Vaginal bleeding is concerning given elevated FSH and represent postmenopausal bleeding. EMBX performed today.   History of mild anemia, MCV normal, asymptomatic  Pulmonary nodules, uncertain etiology  Medical co-morbidities complicating care: H/o cardiac arrest and anoxic brain injury.  Plan:   Problem List Items Addressed This Visit       Respiratory   Multiple lung nodules     Digestive   Cancer of descending colon ([facility]     Genitourinary   Uterine leiomyoma   Other Visit Diagnoses     Postmenopausal bleeding    -  Primary   Relevant Orders   Surgical pathology   Counseling and coordination of care          EMBX performed today.  Follow up biopsy results from today. Follow up in 2 weeks to discuss results.   The patient's diagnosis, an outline of the further diagnostic and laboratory studies which will be required, the recommendation, and alternatives were discussed.  All questions were answered to the patient's satisfaction.  Pulmonary nodules, uncertain etiology, plan for repeat 07/2022.  I personally had a face to face interaction and evaluated the patient jointly with the NP, Ms. Beckey Rutter.  I have reviewed her history and available records and have performed the key portions of the  physical exam including with my findings confirming those documented above by the APP.  I have discussed the case with the APP and the patient.  I agree with the above documentation, assessment and plan which was fully formulated by me.  Counseling was completed by me.    Yvonnia Tango Gaetana Michaelis, MD

## 2022-06-19 LAB — SURGICAL PATHOLOGY

## 2022-06-24 ENCOUNTER — Inpatient Hospital Stay: Payer: Self-pay | Attending: Internal Medicine | Admitting: Obstetrics and Gynecology

## 2022-06-24 VITALS — BP 158/80 | HR 63 | Resp 18 | Wt 208.0 lb

## 2022-06-24 DIAGNOSIS — Z7189 Other specified counseling: Secondary | ICD-10-CM

## 2022-06-24 DIAGNOSIS — Z87891 Personal history of nicotine dependence: Secondary | ICD-10-CM | POA: Insufficient documentation

## 2022-06-24 DIAGNOSIS — N950 Postmenopausal bleeding: Secondary | ICD-10-CM | POA: Insufficient documentation

## 2022-06-24 DIAGNOSIS — R918 Other nonspecific abnormal finding of lung field: Secondary | ICD-10-CM | POA: Insufficient documentation

## 2022-06-24 DIAGNOSIS — Z8674 Personal history of sudden cardiac arrest: Secondary | ICD-10-CM | POA: Insufficient documentation

## 2022-06-24 DIAGNOSIS — D250 Submucous leiomyoma of uterus: Secondary | ICD-10-CM | POA: Insufficient documentation

## 2022-06-24 DIAGNOSIS — D259 Leiomyoma of uterus, unspecified: Secondary | ICD-10-CM

## 2022-06-24 DIAGNOSIS — N920 Excessive and frequent menstruation with regular cycle: Secondary | ICD-10-CM

## 2022-06-24 DIAGNOSIS — Z79899 Other long term (current) drug therapy: Secondary | ICD-10-CM | POA: Insufficient documentation

## 2022-06-24 DIAGNOSIS — C186 Malignant neoplasm of descending colon: Secondary | ICD-10-CM | POA: Insufficient documentation

## 2022-06-24 NOTE — Progress Notes (Signed)
Gynecologic Oncology Interval Visit   Referring Provider: Dr. Rogue Bussing  Chief Concern: Continue surveillance for leiomyoma and vaginal bleeding  Subjective:  [name] [name] is a 56 y.o. P2 (1 SVD; 1 C/S) female with hsitory of leiomyoma and cardiac arrest with anoxic brain injury, returns to clinic for discussion of endometrial biopsy results.   EMBx on 06/17/22.  DIAGNOSIS:  A.  ENDOMETRIUM; PIPELLE BIOPSY:  - SMALL FRAGMENTS OF WEAKLY PROLIFERATIVE ENDOMETRIUM AND ENDOCERVICAL  GLANDULAR MUCOSA WITH FOCAL SQUAMOUS METAPLASIA.  - MUCUS AND INFLAMMATORY DEBRIS.  - NEGATIVE FOR ATYPIA/EIN AND MALIGNANCY.   We reviewed her bleeding patterns - monthly bleeding for 7 days x 2 months (June and July); no bleeding in May, prior monthly bleeding like a period prior to that but she does not remember any dates.  She has no other symptoms.  She is accompanied to clinic with her daughter today.   Gynecologic History:  She has a long history of leiomyoma dating all the way back to 2009. She has not been symptomatic. Her cycles are regular x 5-7 days with normal flow, occurring monthly with menarche at age 2. She missed her cycle for ~3 months which she was in the ICU.  Her last Pap is unknown. She has not had a pelvic exam in ~4-5 years per her report.   February 15, 2012 Last Pelvic US Findings:  The uterus is anteverted. It measures 8.9 x 12.0 x 7.3 cm  transabdominally.  #1: In the anterior left lateral fundus there is an isoechoic subserosal  fibroid labeled 1, which measures 3.0 x 2.4 x 2.9 cm (previously 2.2 x  1.9 x 2.3 cm).  #2: Posterior to structure 1 in the mid-uterine fundus is a hypoechoic  subserosal fibroid labeled 2, which measures 2.5 x 1.7 x 2.7 cm (previously 2.5 x 2.2 x 2.7 cm).  #3: In the posterior left uterus adjacent to endometrium with likely  submucosal component there is an isoechoic fibroid, which measures 2.6 x  2.2 x 2.2 cm (does not correspond to #3 on prior).  #4: No  fibroid was labeled 4.  #5: In the posterior uterine body there is an isoechoic heterogeneous  fibroid labeled 5, which measures 3.9 x 3.1 x 4.5 cm (does not correspond  to #5 on prior).  #6: In the right uterine fundus there is a isoechoic exophytic subserosal  fibroid labeled 6, which measures 2.7 x 2.4 x 2.7 cm.  #7: Inferior to structure 6 in the right uterine fundus is an isoechoic  fibroid, labeled 7, which measures 2.2 x 1.5 x 2.6 cm.  #8: In the left posterior body there is an isoechoic intramural fibroid,  labeled 8, which measures 3.4 x 2.7 x 3.4 cm.   There is a nabothian cyst present in the cervix.   11/2020 [name]. Fargnoli had cardiac arrest [noted to be in V. Fib] s/p ACLS-defibrillation/intubation. Anoxic brain injury-PEG/trach; Her course was complicated by aspiration pneumonia from hospitalization needing tracheostomy/PEG tube placement.  Patient after lengthy hospital stay was discharged to rehab where she subsequently recovered.  As part of her evaluation she had a coronary CT-incidentally picked up that right lower lobe nodule.    February 27, 2021- A lobulated solid nodule is seen in the posterior right lower lobe measures 2.2 x 1.9 cm.   March 20, 2021 This was followed by a PET scan-no significant uptake in the right lower lobe lung nodule; however noted to have uterine mass/uptake -  fibroid versus malignancy  FINDINGS: Mediastinal blood pool activity: SUV max 1.2   Liver activity: SUV max NA   NECK: No areas of abnormal hypermetabolism.   Incidental CT findings: No cervical adenopathy. Minimal mucosal thickening of bilateral maxillary sinuses.   CHEST: Low-level right hilar and infrahilar nodal hypermetabolism without adenopathy is favored to be reactive. The right infrahilar hypermetabolism measures a S.U.V. max of 2.3 including on image 91/3.   The right lower lobe lung nodule measures 1.9 by 1.9 cm and is not significantly  hpermetabolic, including at a S.U.V. max of 1.5.  This measured 2.2 x 1.9 cm on the prior chest CT.   A dominant posterior left upper lobe pulmonary nodule measures 1.1 cm and a S.U.V. max of 1.1 on 67/3.   Incidental CT findings: Smaller bilateral pulmonary nodules as on prior chest CT.   ABDOMEN/PELVIS: An aortocaval node measures 5 mm a S.U.V. max of 3.3 on 156/3.   Left adrenal hypermetabolism laterally corresponds to low-density nodularity, favoring an adenoma. Example 1.3 cm and s.U.V. Max of 3.4.   Uterine enlargement and heterogeneity with 2 hypermetabolic lesions within. Example at ar S.U.V. max of 15.4 within the right-side of the uterine body/fundus.   Incidental CT findings: No renal calculi or hydronephrosis. Aortic atherosclerosis. Mild pelvic floor laxity.   SKELETON: Likely degenerative hypermetabolism about both shoulders. There is also hypermetabolism about the caudal portion of the medial right clavicle, in the region of the anterior first right rib. No CT correlate. Example at a S.U.V. max of 2.9.   Incidental CT findings: none   IMPRESSION: 1. Bilateral pulmonary nodules, including the dominant right lower lobe pulmonary nodule, which are not significantly FDG avid. Considerations include non FDG avid metastasis or infectious/inflammatory process. Potential clinical strategies include tissue sampling versus 3 month follow-up with CT. 2. Uterine enlargement heterogeneity, with hypermetabolic lesions within. Most likely fibroids. Given the pulmonary nodules, leiomyosarcoma(s) or metastasizing benign leiomyomas are considerations. 3. Small abdominal retroperitoneal hypermetabolic node. Most likely benign/reactive. If the patient is diagnosed with metastatic disease, nodal metastasis would be considered. 4. Right chest wall hypermetabolism adjacent the caudal portion of the medial clavicle. Most likely degenerative. The clinical history on cardiac CTA describes cardiac arrest. CT occult injury from CPR could look  similar.   She was discussed at Tumor Board to evaluate lung findings:  Unchanged appearance of bilobed right lower lobe solid pulmonary nodules (1.8 cm), left upper lobe solid pulmonary nodule (1.4 cm), and diffuse scattered less than 6 mm solid pulmonary nodules.   She is followed by lung nodule clinic.   04/02/2021 she had a normal FSH 6.7 and negative Pap NIML/HRHPV negative.   04/08/2021 pelvic MRI  Reproductive: Uterus: Measures 13.0 x 9.8 by 13.2 cm (volume = 880 cm^3). There are multiple uterine fibroids, too numerous to count. The FDG avid fibroids within the posterior lower uterine segment and anterior lower uterine segment fibroids are intramural, submucosal, and subserosal  IMPRESSION: 1. Enlarged uterus with uterine fibroids which are too numerous to count. There are no distinguishing characteristics between the FDG avid and non FDG avid fibroids identified on recent PET-CT. The FDG avid fibroid within the left anterior lower uterine segment exhibits relative hypoenhancement on the postcontrast images. However, the FDG avid fibroid within the posterior lower uterine segment exhibits relative uniform enhancement on the postcontrast images. If there is a clinical concern for leiomyosarcoma or metastasizing benign leiomyomas correlation with tissue sampling is recommended. 2. No acute findings within the pelvis. 3. Diastasis recti with  ventral herniation of the anterior fundal fibroid.  03/23/2022 Diagnosed with colon cancer s/p Robotic assisted laparoscopic partial (descending) colectomy and umbilical hernia repair COLON, DESCENDING; PARTIAL RESECTION:  - MODERATELY DIFFERENTIATED ADENOCARCINOMA, INVASIVE INTO SUBMUCOSA  (PT1), ARISING IN A BACKGROUND OF TUBULAR ADENOMA WITH HIGH-GRADE  DYSPLASIA.  - TEN LYMPH NODES, NEGATIVE FOR MALIGNANCY (0/10).  - DISTAL MARGIN NEGATIVE FOR DYSPLASIA.  - LOW GRADE DYSPLASIA PRESENT AT PROXIMAL MARGIN (NOT FINAL MARGIN).  - ALL SURGICAL  MARGINS NEGATIVE FOR MALIGNANCY.  - SEE CANCER SUMMARY.   B. COLON, PROXIMAL DESCENDING; PARTIAL RESECTION:  - BENIGN COLONIC TISSUE.  - TWO LYMPH NODES, NEGATIVE FOR MALIGNANCY (0/2).  - NEGATIVE FOR DYSPLASIA AND MALIGNANCY.   MMR protein expression intact  Surgical findings "Huge uterus with multiple large fibroids."   04/08/2022 [place] elevated to 28.8 which is postmenopausal based on lab range  04/03/2022 MRI "Diffuse uterine involvement by innumerable small fibroids, without significant change since prior study. No evidence of adnexal mass or other significant abnormality." However when comparing reports the size the uterus has increased in size to 19.3 x 10.3 by 14.2 cm (volume = 1480 cm^3) from 13.0 x 9.8 by 13.2 cm (volume = 880 cm^3). MRI findings reviewed with Dr. Kris Hartmann today and he confirmed the uterine mass with the leiomyomas was stable in size.    She is often accompanied by her husband. 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 They had two children - a boy and a girl.     Problem List: Patient Active Problem List   Diagnosis Date Noted   Uterine leiomyoma 04/08/2022   Cancer of descending colon ([place]) 04/01/2022   Tubular adenoma of colon 03/23/2022   Multiple lung nodules 03/27/2021   Dysarthria and anarthria 02/11/2021   Sinus tachycardia    Labile blood glucose    Dysphagia    Seizure prophylaxis    Malnutrition of moderate degree 01/23/2021   Anoxic brain injury ([place]) 01/10/2021   Prediabetes    Dyslipidemia    S/P percutaneous endoscopic gastrostomy (PEG) tube placement [facility])    Status post tracheostomy ([place])    Aspiration pneumonia of both lower lobes due to gastric secretions ([place]) 01/05/2021   Acute respiratory failure ([facility])    Hypoxic ischemic encephalopathy    Endotracheal tube present    Cardiac arrest ([place]) 12/02/2020   Essential hypertension 12/09/2011    Past Medical History: Past Medical History:  Diagnosis  Date   Anemia    Anoxic brain injury ([place]) 11/2020   a.) following V-fib cardiac arrest. b.) Extended hospital admission x 5 weeks for anoxic encephalopathy requiring tracheostomy and PEG tube placement   Cardiac arrest with ventricular fibrillation ([place]) 12/02/2020   a.) found down at home "gurgling" and apneic; downtime unknown.  911 called and CPR was started.  EMS arrived and patient found to be in ventricular fibrillation; defibrillated x2 and epinephrine given.  Severe hypokalemia and DKA; K+ 2.1, CO2 20, lactic acid 10, gap 21.   Cognitive communication deficit    Colonic mass 02/06/2022   a.) Bx (+) for high-grade dysplasia within adenoma; resection recommended.   History of 2019 novel coronavirus disease (COVID-19) 06/2021   History of respiratory failure 12/02/2020   Hyperlipidemia    Hypertension    Hypokalemia    a.) avoid diuretic medications per cardiology   IDA (iron deficiency anemia)    Internal hemorrhoids    Multiple lung nodules on CT  a.) [facility] by [facility] screening [facility]   Seizures ([place])    T2DM (type 2 diabetes mellitus) ([place])    Tubular adenoma of colon 02/06/2022   a.) Bx (-) for high-grade dysplasia or malignancy   Uterine leiomyoma     Past Surgical History: Past Surgical History:  Procedure Laterality Date   COLONOSCOPY WITH PROPOFOL N/A 02/06/2022   Procedure: COLONOSCOPY WITH PROPOFOL;  Surgeon: Lesly Rubenstein, MD;  Location: [facility] ENDOSCOPY;  Service: Endoscopy;  Laterality: N/A;   PEG PLACEMENT N/A 12/13/2020   Procedure: PERCUTANEOUS ENDOSCOPIC GASTROSTOMY (PEG) PLACEMENT;  Surgeon: Lesly Rubenstein, MD;  Location: [facility] ENDOSCOPY;  Service: Endoscopy;  Laterality: N/A;   ROBOT ASSISTED SIGMOID COLECTOMY   03/23/2022   TRACHEOSTOMY TUBE PLACEMENT N/A 12/13/2020   Procedure: TRACHEOSTOMY;  Surgeon: Clyde Canterbury, MD;  Location: [facility] ORS;  Service: ENT;  Laterality: N/A;   UMBILICAL HERNIA REPAIR N/A 03/23/2022   Procedure: HERNIA  REPAIR UMBILICAL ADULT;  Surgeon: Herbert Pun, MD;  Location: [facility] ORS;  Service: General;  Laterality: N/A;    Past Gynecologic History:  Menarche: 12 Menstrual details: as per HPI Menses regular: Yes History of Abnormal pap:No Last pap: obtained 04/02/2021 Contraception: None - uses withdrawal method Sexually active: no not at this time since her illness  OB History: G10P2 (1SVD - preterm; 1 C/S) OB History  Gravida Para Term Preterm AB Living  _0 SAB IAB Ectopic Multiple Live Births  8            # Outcome Date GA Lbr Len/2nd Weight Sex Delivery Anes PTL Lv  10 SAB           9 SAB           8 SAB           7 SAB           6 SAB           5 SAB           4 SAB           3 SAB           2 Para           1 Para             Family History: Family History  Problem Relation Age of Onset   Hypertension Mother    Leukemia Mother    Diabetes Mother    Healthy Father     Social History: Social History   Socioeconomic History   Marital status: Married    Spouse name: Not on file   Number of children: Not on file   Years of education: Not on file   Highest education level: Not on file  Occupational History   Not on file  Tobacco Use   Smoking status: Former    Types: Cigarettes    Quit date: 2001    Years since quitting: 22.5   Smokeless tobacco: Never  Vaping Use   Vaping Use: Never used  Substance and Sexual Activity   Alcohol use: No   Drug use: Never   Sexual activity: Not on file  Other Topics Concern   Not on file  Social History Narrative   QUIT smoke [2001]; no alcohol; lives in [place] with husband; 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    Social Determinants of Health   Financial Resource Strain: Not on file  Food  Insecurity: Not on file  Transportation Needs: Not on file  Physical Activity: Not on file  Stress: Not on file  Social Connections: Not on file  Intimate Partner Violence: Not on file    Allergies: No Known  Allergies  Current Medications: Current Outpatient Medications  Medication Sig Dispense Refill   amLODipine (NORVASC) 10 MG tablet Take 10 mg by mouth daily.     atorvastatin (LIPITOR) 80 MG tablet Take by mouth.     potassium chloride SA (KLOR-CON M) 20 MEQ tablet Take 1 tablet by mouth 2 (two) times daily.     carvedilol (COREG) 12.5 MG tablet Take 1 tablet (12.5 mg total) by mouth 2 (two) times daily. 180 tablet 3   HYDROcodone-acetaminophen (NORCO/VICODIN) 5-325 MG tablet Take 1 tablet by mouth every 4 (four) hours as needed for moderate pain. (Patient not taking: Reported on 04/01/2022) 20 tablet 0   saccharomyces boulardii (FLORASTOR) 250 MG capsule Take 1 capsule (250 mg total) by mouth 2 (two) times daily. (Patient not taking: Reported on 03/16/2022) 60 capsule 0   No current facility-administered medications for this visit.   Review of Systems General: no complaints  HEENT: no complaints  Lungs: no complaints  Cardiac: no complaints  GI: no complaints  GU: no complaints  Musculoskeletal: no complaints  Extremities: no complaints  Skin: no complaints  Neuro: no complaints  Endocrine: no complaints  Psych: no complaints       Objective:  Physical Examination:  BP (!) 158/80 (BP Location: Left Arm, Patient Position: Sitting)   Pulse 63   Resp 18   Wt 208 lb (94.3 kg)   BMI 39.30 kg/m    Performance status: 1  GENERAL: Patient is a well appearing female in no acute distress ABDOMEN:  Soft, nontender,  nondistended. Small 2 cm umbilical hernia. Pelvic mass c/w fibroid uteruns unable to determine exact size. Uterus palpated up to at least the umbilicus. EXTREMITIES:  No peripheral edema.   NEURO:  Nonfocal. Well oriented.  Appropriate affect stable c/w prior anoxic injury.    Pelvic: exam chaperoned deferred to 06/17/2022 EGBUS: no lesions Cervix: no lesions, nontender, mobile Vagina: no lesions, no discharge or bleeding Uterus: enlarged unable to determine exact  size today; nontender, limited mobility but parameter free bilaterally. Adnexa: no palpable masses Rectovaginal: confirmatory    Radiologic Imaging:  04/08/2021 MRI imaging:  EXAM:  MRI PELVIS WITHOUT AND WITH CONTRAST   TECHNIQUE: Multiplanar multisequence MR imaging of the pelvis was performed both before and after administration of intravenous contrast.   CONTRAST:  14m GADAVIST GADOBUTROL 1 MMOL/ML IV SOLN   COMPARISON:  PET-CT from 03/19/2021   FINDINGS: Urinary Tract:  Urinary bladder is within normal limits.   Bowel:  Unremarkable visualized pelvic bowel loops.   Vascular/Lymphatic: Patent vasculature. No aneurysm identified. No pelvic adenopathy. Right pelvic sidewall lymph node is upper limits of normal measuring 9 mm previously 8 mm, image 31/21.   Reproductive: Uterus: Measures 13.0 x 9.8 by 13.2 cm (volume = 880 cm^3). There are multiple uterine fibroids, too numerous to count. The FDG avid fibroids within the posterior lower uterine segment and anterior lower uterine segment fibroids are intramural, submucosal, and subserosal including:   -FDG avid fibroid within the left anterior lower uterine segment measures 2.3 x 2.5 by 2.3 cm, image 32/17 and image 46/27. This exhibits T2 hypointensity, T1 isointensity with heterogeneous hypoenhancement on the postcontrast images.   -Smaller FDG avid fibroid within the posterior lower uterine segment is T2  hypointense, T1 isointense and exhibits avid enhancement on the postcontrast images.   -not significantly FDG avid is the dominant fibroid within the left side of fundus with a long axis of 6.4 cm, image 18/14. This exhibits uniform enhancement on the postcontrast images.   -also non FDG avid is a exophytic subserosal fibroid arising off the anterior fundus measures 4 cm, image 36/27. this also exhibits relative uniform enhancement on the postcontrast images.   Endometrium: Measures 5 mm, image 18/28. No focal  abnormality aside from submucosal fibroids.   Right ovary: Measures 4.4 x 3.5 by 3.9 cm (volume = 31 cm^3). Multiple prominent cysts are identified. The largest measures 2.8 cm, image 26/2.   Left ovary: Measures 4.9 x 1.6 by 2.4 cm (volume = 9.9 cm^3). Prominent follicles are noted. The largest measures 8 mm, image 13/3.   Other: Trace free fluid noted within the cul-de-sac. No discrete fluid collections.   Musculoskeletal: Diastasis recti is identified with ventral herniation of anterior fundal fibroid, image 9/3.   IMPRESSION: 1. Enlarged uterus with uterine fibroids which are too numerous to count. There are no distinguishing characteristics between the FDG avid and non FDG avid fibroids identified on recent PET-CT. The FDG avid fibroid within the left anterior lower uterine segment exhibits relative hypoenhancement on the postcontrast images. However, the FDG avid fibroid within the posterior lower uterine segment exhibits relative uniform enhancement on the postcontrast images. If there is a clinical concern for leiomyosarcoma or metastasizing benign leiomyomas correlation with tissue sampling is recommended. 2. No acute findings within the pelvis. 3. Diastasis recti with ventral herniation of the anterior fundal fibroid.     04/06/2022 EXAM: MRI PELVIS WITHOUT AND WITH CONTRAST   TECHNIQUE: Multiplanar multisequence MR imaging of the pelvis was performed both before and after administration of intravenous contrast.   CONTRAST:  46m GADAVIST GADOBUTROL 1 MMOL/ML IV SOLN   COMPARISON:  04/08/2021   FINDINGS: Lower Urinary Tract: No urinary bladder or urethral abnormality identified.   Bowel: Unremarkable pelvic bowel loops.   Vascular/Lymphatic: Unremarkable. No pathologically enlarged pelvic lymph nodes identified.   Reproductive:   -- Uterus: Measures 19.3 x 10.3 by 14.2 cm (volume = 1480 cm^3). This is not significantly changed since previous study  when remeasured in same planes. Innumerable small fibroids are seen involving the uterus diffusely, which are intramural, subserosal, and submucosal in location. Largest fibroid is subserosal in location in the anterior uterine fundus measuring 5.6 cm in diameter. No significant changes seen compared to prior exam. Cervix and vagina are unremarkable.   -- Right ovary: Appears normal. No ovarian or adnexal masses identified.   -- Left ovary: Appears normal. No ovarian or adnexal masses identified.   Other: No peritoneal thickening or abnormal free fluid.   Musculoskeletal:  Unremarkable.   IMPRESSION: Diffuse uterine involvement by innumerable small fibroids, without significant change since prior study.   No evidence of adnexal mass or other significant abnormality.       Assessment:  ATACARRA JUSTOis a 56y.o. female diagnosed with PET avid uterine masses c/w leiomyoma, overall stable on MRI report but increased size. Fibroids can be PET avid (Ma et al [facility] 2016). Vaginal bleeding is concerning given elevated FSH and represent postmenopausal bleeding vs perimenopausal pattern. EMBX negative for atypia/EIN or malignancy  History of mild anemia, MCV normal, asymptomatic  Pulmonary nodules, uncertain etiology  Medical co-morbidities complicating care: H/o cardiac arrest and anoxic brain injury.  Plan:   Problem List  Items Addressed This Visit       Genitourinary   Uterine leiomyoma - Primary   Relevant Orders   MR PELVIS W WO CONTRAST    EMBX reassuring,and bleeding seems to be perimenopausal pattern. I spoke with Dr. Dorma Russell at [facility] and she agrees that this is most likely perimenopausal in nature. That the patient will probably continue to have occasional bleeding episodes and then taper off gradually to no bleeding. [name] [name] has no concerning symptoms. Therefore, I recommended follow up with me in 03/2023 with repeat pelvic MRI and we discussed  concerning symptoms. If she has increased bleeding or bleeding that doesn't stop after week to contact us. She will also keep a calendar of her bleeding. We discussed the possible need for surgery in the future.   The patient's diagnosis, an outline of the further diagnostic and laboratory studies which will be required, the recommendation, and alternatives were discussed.  All questions were answered to the patient's satisfaction.  Pulmonary nodules, uncertain etiology, plan for repeat 07/2022. She is already scheduled with the Lung Nodule Program. We will defer to that team.    Maryum Batterson Gaetana Michaelis, MD

## 2022-07-01 ENCOUNTER — Ambulatory Visit: Payer: Self-pay

## 2022-08-12 ENCOUNTER — Other Ambulatory Visit: Payer: Self-pay

## 2022-08-14 ENCOUNTER — Ambulatory Visit: Payer: Self-pay | Admitting: Nurse Practitioner

## 2022-08-28 ENCOUNTER — Ambulatory Visit: Payer: Self-pay | Attending: Oncology

## 2022-09-01 ENCOUNTER — Other Ambulatory Visit: Payer: Self-pay | Admitting: Cardiovascular Disease

## 2022-09-01 ENCOUNTER — Inpatient Hospital Stay: Payer: Self-pay | Admitting: Internal Medicine

## 2022-09-01 ENCOUNTER — Telehealth: Payer: Self-pay | Admitting: Internal Medicine

## 2022-09-01 ENCOUNTER — Inpatient Hospital Stay: Payer: Self-pay

## 2022-09-01 NOTE — Telephone Encounter (Signed)
Patient no showed for CT- called to reschedule CT and f/u appointments- patient answered and hung up- called back to reschedule- left voicemail. Needs CT rescheduled and follow up appointment with Labs for Dr. Jacinto Reap.

## 2022-09-01 NOTE — Assessment & Plan Note (Deleted)
#  Colon cancer descending colon stage I [screening colonoscopy]; PT1N0 stage I colon cancer.  Discussed that she is likely cured from her colon cancer with surgery more than 90% at times.  We will hold for any adjuvant therapy.  However I would recommend continued surveillance colonoscopies as recommended by GI.    #Incidental lung nodules right lower lobe lung nodule approximate 2.5 cm; not significantly PET avid.  Other subcentimeter lung nodules bilaterally  [s/p Dr.Aleskero].  September 2022 surveillance CT scan-stable.  Will recommend repeat scan in approximately 5 months.   #Uterine enlargement-hypermetabolic lesions-again discussed possibility of malignant [especially given lung nodules; small abdominal retroperitoneal hypermetabolic lymph node] versus benign etiologies.  Likely benign entities.  S/p evaluation with Dr. Theora Gianotti.  Recommend follow-up with GYN oncology/follow-up MRI as recommended.  Informed nurse navigator  # DISPOSITION; # RE- schedule the CT scan in Sep 2023 to October/prior to next MD appointment # follow up in 5 months- MD: labs- Cbc/cmp-Thanks  # I reviewed the blood work- with the patient in detail; also reviewed the imaging independently [as summarized above]; and with the patient in detail.   Cc: Hayley-

## 2022-09-02 MED ORDER — CARVEDILOL 12.5 MG PO TABS: 12.5000 mg | ORAL_TABLET | Freq: Two times a day (BID) | ORAL | 0 refills | Status: DC

## 2022-09-03 ENCOUNTER — Encounter: Payer: Self-pay | Admitting: Internal Medicine

## 2022-09-03 ENCOUNTER — Inpatient Hospital Stay: Payer: Self-pay | Attending: Internal Medicine

## 2022-09-03 ENCOUNTER — Inpatient Hospital Stay (HOSPITAL_BASED_OUTPATIENT_CLINIC_OR_DEPARTMENT_OTHER): Payer: Self-pay | Admitting: Internal Medicine

## 2022-09-03 VITALS — BP 156/98 | HR 76 | Temp 97.1°F | Resp 16 | Wt 200.0 lb

## 2022-09-03 DIAGNOSIS — R918 Other nonspecific abnormal finding of lung field: Secondary | ICD-10-CM | POA: Insufficient documentation

## 2022-09-03 DIAGNOSIS — N852 Hypertrophy of uterus: Secondary | ICD-10-CM | POA: Insufficient documentation

## 2022-09-03 DIAGNOSIS — C186 Malignant neoplasm of descending colon: Secondary | ICD-10-CM

## 2022-09-03 DIAGNOSIS — Z79899 Other long term (current) drug therapy: Secondary | ICD-10-CM | POA: Insufficient documentation

## 2022-09-03 LAB — CBC WITH DIFFERENTIAL/PLATELET
Abs Immature Granulocytes: 0 10*3/uL (ref 0.00–0.07)
Basophils Absolute: 0.1 10*3/uL (ref 0.0–0.1)
Basophils Relative: 1 %
Eosinophils Absolute: 0.6 10*3/uL — ABNORMAL HIGH (ref 0.0–0.5)
Eosinophils Relative: 12 %
HCT: 43.8 % (ref 36.0–46.0)
Hemoglobin: 15.1 g/dL — ABNORMAL HIGH (ref 12.0–15.0)
Immature Granulocytes: 0 %
Lymphocytes Relative: 46 %
Lymphs Abs: 2.2 10*3/uL (ref 0.7–4.0)
MCH: 32.3 pg (ref 26.0–34.0)
MCHC: 34.5 g/dL (ref 30.0–36.0)
MCV: 93.6 fL (ref 80.0–100.0)
Monocytes Absolute: 0.3 10*3/uL (ref 0.1–1.0)
Monocytes Relative: 6 %
Neutro Abs: 1.6 10*3/uL — ABNORMAL LOW (ref 1.7–7.7)
Neutrophils Relative %: 35 %
Platelets: 176 10*3/uL (ref 150–400)
RBC: 4.68 MIL/uL (ref 3.87–5.11)
RDW: 12 % (ref 11.5–15.5)
WBC: 4.7 10*3/uL (ref 4.0–10.5)
nRBC: 0 % (ref 0.0–0.2)

## 2022-09-03 LAB — COMPREHENSIVE METABOLIC PANEL
ALT: 12 U/L (ref 0–44)
AST: 15 U/L (ref 15–41)
Albumin: 3.8 g/dL (ref 3.5–5.0)
Alkaline Phosphatase: 88 U/L (ref 38–126)
Anion gap: 6 (ref 5–15)
BUN: 8 mg/dL (ref 6–20)
CO2: 29 mmol/L (ref 22–32)
Calcium: 9 mg/dL (ref 8.9–10.3)
Chloride: 105 mmol/L (ref 98–111)
Creatinine, Ser: 0.59 mg/dL (ref 0.44–1.00)
GFR, Estimated: >60 mL/min (ref >60)
Glucose, Bld: 123 mg/dL — ABNORMAL HIGH (ref 70–99)
Potassium: 3.4 mmol/L — ABNORMAL LOW (ref 3.5–5.1)
Sodium: 140 mmol/L (ref 135–145)
Total Bilirubin: 0.5 mg/dL (ref 0.3–1.2)
Total Protein: 7.7 g/dL (ref 6.5–8.1)

## 2022-09-03 NOTE — Assessment & Plan Note (Addendum)
#  Colon cancer descending colon stage I [screening colonoscopy]; PT1N0 stage I colon cancer.  Discussed that she is likely cured from her colon cancer with surgery more than 90% at times.  We will hold for any adjuvant therapy.  However I would recommend continued surveillance colonoscopies as recommended by GI. STABLE.    #Incidental lung nodules right lower lobe lung nodule approximate 2.5 cm; not significantly PET avid.  Other subcentimeter lung nodules bilaterally  [s/p Dr.Aleskero].  September 2022 surveillance CT scan-STABLE; patient did not get her scans done as ordered.  We will reschedule the today.  #Uterine enlargement-hypermetabolic lesions/ small abdominal retroperitoneal hypermetabolic lymph node]   Likely benign entities.  S/p evaluation with Dr. Theora Gianotti.  Recommend follow-up with GYN oncology/follow-up MRI as MAY 2024- and also repeat follow up in mAY 2024 with gyn-onc.  Reminded the patient to follow-up as planned.  # DISPOSITION; # RE- schedule the CT chest scan in next 1-2 weeks. # follow up TBD- Dr.B   Cc: Hayley-

## 2022-09-03 NOTE — Progress Notes (Signed)
Pt in for follow up and scan results.  Pt denies any concerns today.

## 2022-09-03 NOTE — Progress Notes (Signed)
[facility] CONSULT NOTE  Patient Care Team: Marinda Elk, MD as PCP - General (Physician Assistant) Minna Merritts, MD as PCP - Cardiology (Cardiology) Clent Jacks, RN as Oncology Nurse Navigator  CHIEF COMPLAINTS/PURPOSE OF CONSULTATION: COLON CANCER   Oncology History Overview Note  # April 2023- [screening colonoscopy]-descending colon mass with ulceration; Dr. Peyton Najjar s/p surgery-stage I colon cancer PT1N0   #April 2022- ~2.5 cm lobulated lobe lung nodule [incidental on coronary CTA]; April 2022 PET scan-no significant uptake; however uterine enlargement-uptake fibroid versus malignancy  #Uterine enlargement/fibroid versus malignancy  # JAN 2022-cardiac arrest s/p resuscitation; anoxic brain injury-PEG/trach; rehab currently on; trach/PEG-explanted.   Cancer of descending colon ([place])  04/01/2022 Initial Diagnosis   Cancer of descending colon ([place])   04/01/2022 Cancer Staging   Staging form: Colon and Rectum, AJCC 8th Edition - Clinical: Stage I (cT1, cN0, cM0) - Signed by Cammie Sickle, MD on 04/01/2022 Total positive nodes: 0      HISTORY OF PRESENTING ILLNESS: Accompanied by husband.  Ambulating independently.  [name] [name] 56 y.o.  female with multiple medical problems including stage I colon cancer, lung nodules;  uterine fibroids;  history of anoxic brain injury is here for follow-up.  Patient denies any new hospitalizations.  Denies any new shortness of breath or cough.  Unfortunately she did not have her scans done as ordered.    Review of Systems  Constitutional:  Positive for malaise/fatigue. Negative for chills, diaphoresis, fever and weight loss.  HENT:  Negative for nosebleeds and sore throat.   Eyes:  Negative for double vision.  Respiratory:  Negative for cough, hemoptysis, sputum production, shortness of breath and wheezing.   Cardiovascular:  Negative for chest pain, palpitations, orthopnea and leg swelling.   Gastrointestinal:  Negative for abdominal pain, blood in stool, constipation, diarrhea, heartburn, melena, nausea and vomiting.  Genitourinary:  Negative for dysuria, frequency and urgency.  Musculoskeletal:  Positive for back pain and joint pain.  Skin: Negative.  Negative for itching and rash.  Neurological:  Negative for dizziness, tingling, focal weakness, weakness and headaches.  Endo/Heme/Allergies:  Does not bruise/bleed easily.  Psychiatric/Behavioral:  Negative for depression. The patient is not nervous/anxious and does not have insomnia.      MEDICAL HISTORY:  Past Medical History:  Diagnosis Date   Anemia    Anoxic brain injury ([place]) 11/2020   a.) following V-fib cardiac arrest. b.) Extended hospital admission x 5 weeks for anoxic encephalopathy requiring tracheostomy and PEG tube placement   Cardiac arrest with ventricular fibrillation ([place]) 12/02/2020   a.) found down at home "gurgling" and apneic; downtime unknown.  911 called and CPR was started.  EMS arrived and patient found to be in ventricular fibrillation; defibrillated x2 and epinephrine given.  Severe hypokalemia and DKA; K+ 2.1, CO2 20, lactic acid 10, gap 21.   Cognitive communication deficit    Colonic mass 02/06/2022   a.) Bx (+) for high-grade dysplasia within adenoma; resection recommended.   History of 2019 novel coronavirus disease (COVID-19) 06/2021   History of respiratory failure 12/02/2020   Hyperlipidemia    Hypertension    Hypokalemia    a.) avoid diuretic medications per cardiology   IDA (iron deficiency anemia)    Internal hemorrhoids    Multiple lung nodules on CT    a.) followed by [facility] lung cancer screening [facility]   Seizures ([place])    T2DM (type 2 diabetes mellitus) ([place])    Tubular adenoma of colon  02/06/2022   a.) Bx (-) for high-grade dysplasia or malignancy   Uterine leiomyoma     SURGICAL HISTORY: Past Surgical History:  Procedure Laterality Date   COLONOSCOPY WITH PROPOFOL N/A  02/06/2022   Procedure: COLONOSCOPY WITH PROPOFOL;  Surgeon: Lesly Rubenstein, MD;  Location: [facility] ENDOSCOPY;  Service: Endoscopy;  Laterality: N/A;   PEG PLACEMENT N/A 12/13/2020   Procedure: PERCUTANEOUS ENDOSCOPIC GASTROSTOMY (PEG) PLACEMENT;  Surgeon: Lesly Rubenstein, MD;  Location: [facility] ENDOSCOPY;  Service: Endoscopy;  Laterality: N/A;   ROBOT ASSISTED SIGMOID COLECTOMY   03/23/2022   TRACHEOSTOMY TUBE PLACEMENT N/A 12/13/2020   Procedure: TRACHEOSTOMY;  Surgeon: Clyde Canterbury, MD;  Location: [facility] ORS;  Service: ENT;  Laterality: N/A;   UMBILICAL HERNIA REPAIR N/A 03/23/2022   Procedure: HERNIA REPAIR UMBILICAL ADULT;  Surgeon: Herbert Pun, MD;  Location: [facility] ORS;  Service: General;  Laterality: N/A;    SOCIAL HISTORY: Social History   Socioeconomic History   Marital status: Married    Spouse name: Not on file   Number of children: Not on file   Years of education: Not on file   Highest education level: Not on file  Occupational History   Not on file  Tobacco Use   Smoking status: Former    Types: Cigarettes    Quit date: 2001    Years since quitting: 22.7   Smokeless tobacco: Never  Vaping Use   Vaping Use: Never used  Substance and Sexual Activity   Alcohol use: No   Drug use: Never   Sexual activity: Not on file  Other Topics Concern   Not on file  Social History Narrative   QUIT smoke [2001]; no alcohol; lives in [place] with husband; 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    Social Determinants of Health   Financial Resource Strain: Not on file  Food Insecurity: Not on file  Transportation Needs: Not on file  Physical Activity: Not on file  Stress: Not on file  Social Connections: Not on file  Intimate Partner Violence: Not on file    FAMILY HISTORY: Family History  Problem Relation Age of Onset   Hypertension Mother    Leukemia Mother    Diabetes Mother    Healthy Father     ALLERGIES:  has No Known Allergies.  MEDICATIONS:   Current Outpatient Medications  Medication Sig Dispense Refill   amLODipine (NORVASC) 10 MG tablet Take 10 mg by mouth daily.     atorvastatin (LIPITOR) 80 MG tablet Take by mouth.     carvedilol (COREG) 12.5 MG tablet Take 1 tablet (12.5 mg total) by mouth 2 (two) times daily. Please call [phone] for an appointment for further refills 60 tablet 0   potassium chloride SA (KLOR-CON M) 20 MEQ tablet Take 1 tablet by mouth 2 (two) times daily.     No current facility-administered medications for this visit.      Marland Kitchen  PHYSICAL EXAMINATION: ECOG PERFORMANCE STATUS: 0 - Asymptomatic  Vitals:   09/03/22 1202  BP: (!) 156/98  Pulse: 76  Resp: 16  Temp: (!) 97.1 F (36.2 C)  SpO2: 99%   Filed Weights   09/03/22 1202  Weight: 200 lb (90.7 kg)    Physical Exam HENT:     Head: Normocephalic and atraumatic.     Mouth/Throat:     Pharynx: No oropharyngeal exudate.  Eyes:     Pupils: Pupils are equal, round, and reactive to light.  Cardiovascular:     Rate and Rhythm: Normal  rate and regular rhythm.  Pulmonary:     Effort: Pulmonary effort is normal. No respiratory distress.     Breath sounds: Normal breath sounds. No wheezing.  Abdominal:     General: Bowel sounds are normal. There is no distension.     Palpations: Abdomen is soft. There is no mass.     Tenderness: There is no abdominal tenderness. There is no guarding or rebound.  Musculoskeletal:        General: No tenderness. Normal range of motion.     Cervical back: Normal range of motion and neck supple.  Skin:    General: Skin is warm.  Neurological:     Mental Status: She is alert and oriented to person, place, and time.  Psychiatric:        Mood and Affect: Affect normal.      LABORATORY DATA:  I have reviewed the data as listed Lab Results  Component Value Date   WBC 4.7 09/03/2022   HGB 15.1 (H) 09/03/2022   HCT 43.8 09/03/2022   MCV 93.6 09/03/2022   PLT 176 09/03/2022   Recent Labs     03/23/22 1111 03/24/22 0517 09/03/22 1107  NA 143 138 140  K 3.5 3.1* 3.4*  CL 103 101 105  CO2  --  24 29  GLUCOSE 118* 142* 123*  BUN '8 11 8  '$ CREATININE 0.70 0.87 0.59  CALCIUM  --  8.0* 9.0  GFRNONAA  --  >60 >60  PROT  --   --  7.7  ALBUMIN  --   --  3.8  AST  --   --  15  ALT  --   --  12  ALKPHOS  --   --  88  BILITOT  --   --  0.5    RADIOGRAPHIC STUDIES: I have personally reviewed the radiological images as listed and agreed with the findings in the report. No results found.   ASSESSMENT & PLAN:   Cancer of descending colon ([place]) #Colon cancer descending colon stage I [screening colonoscopy]; PT1N0 stage I colon cancer.  Discussed that she is likely cured from her colon cancer with surgery more than 90% at times.  We will hold for any adjuvant therapy.  However I would recommend continued surveillance colonoscopies as recommended by GI. STABLE.    #Incidental lung nodules right lower lobe lung nodule approximate 2.5 cm; not significantly PET avid.  Other subcentimeter lung nodules bilaterally  [s/p Dr.Aleskero].  September 2022 surveillance CT scan-STABLE; patient did not get her scans done as ordered.  We will reschedule the today.  #Uterine enlargement-hypermetabolic lesions/ small abdominal retroperitoneal hypermetabolic lymph node]   Likely benign entities.  S/p evaluation with Dr. Theora Gianotti.  Recommend follow-up with GYN oncology/follow-up MRI as MAY 2024- and also repeat follow up in mAY 2024 with gyn-onc.  Reminded the patient to follow-up as planned.  # DISPOSITION; # RE- schedule the CT chest scan in next 1-2 weeks. # follow up TBD- Dr.B   Cc: Hayley-   All questions were answered. The patient knows to call the clinic with any problems, questions or concerns.    Cammie Sickle, MD 09/03/2022 3:00 PM

## 2022-09-08 ENCOUNTER — Ambulatory Visit: Admission: RE | Admit: 2022-09-08 | Payer: Self-pay | Source: Ambulatory Visit

## 2022-09-21 ENCOUNTER — Ambulatory Visit: Payer: Self-pay | Attending: Internal Medicine

## 2022-12-24 ENCOUNTER — Ambulatory Visit
Admission: RE | Admit: 2022-12-24 | Discharge: 2022-12-24 | Disposition: A | Discharge status: Home / Self Care | Payer: BLUE CROSS/BLUE SHIELD | Source: Ambulatory Visit | Attending: Internal Medicine | Admitting: Internal Medicine

## 2022-12-24 DIAGNOSIS — C186 Malignant neoplasm of descending colon: Secondary | ICD-10-CM | POA: Insufficient documentation

## 2022-12-24 DIAGNOSIS — R918 Other nonspecific abnormal finding of lung field: Secondary | ICD-10-CM | POA: Insufficient documentation

## 2023-01-02 ENCOUNTER — Encounter: Payer: Self-pay | Admitting: Internal Medicine

## 2023-01-02 NOTE — Progress Notes (Signed)
Results of the CT scan discussed with the nurse navigator, [name].  [name] [name] to discuss the results of the CT scan with the patient.  Patient will follow-up with pulmonary with regards to lung nodules.  Patient will follow-up with [place] as needed. GB

## 2023-01-05 ENCOUNTER — Other Ambulatory Visit: Payer: Self-pay | Admitting: Cardiovascular Disease

## 2023-01-05 NOTE — Telephone Encounter (Signed)
LVM to schedule

## 2023-01-05 NOTE — Telephone Encounter (Signed)
Please contact pt for future appointment. Pt is overdue for 6 month f/u.

## 2023-01-07 ENCOUNTER — Encounter: Payer: Self-pay | Admitting: *Deleted

## 2023-01-07 DIAGNOSIS — R918 Other nonspecific abnormal finding of lung field: Secondary | ICD-10-CM

## 2023-01-14 NOTE — Telephone Encounter (Signed)
LVM to schedule appt

## 2023-01-18 ENCOUNTER — Encounter: Payer: Self-pay | Admitting: Cardiovascular Disease

## 2023-01-18 NOTE — Telephone Encounter (Signed)
LVM to schedule appt

## 2023-01-18 NOTE — Telephone Encounter (Signed)
Unable to reach letter sent

## 2023-01-27 ENCOUNTER — Other Ambulatory Visit: Payer: Self-pay | Admitting: Cardiovascular Disease

## 2023-01-27 NOTE — Telephone Encounter (Signed)
Unable to leave message, voice mailbox is full.

## 2023-01-27 NOTE — Telephone Encounter (Signed)
Paitent is past due for a follow up appt.  Please call to schedule a follow up appt.

## 2023-01-27 NOTE — Telephone Encounter (Signed)
MyChart message sent to patient.

## 2023-01-27 NOTE — Telephone Encounter (Signed)
Unable to leave voicemail, mailbox is full.

## 2023-01-28 NOTE — Telephone Encounter (Signed)
Unable to leave voicemail, mailbox is full

## 2023-02-03 ENCOUNTER — Encounter: Payer: Self-pay | Admitting: Cardiovascular Disease

## 2023-02-03 NOTE — Telephone Encounter (Signed)
Left voice mail to schedule appt. Unable to contact letter sent.

## 2023-04-06 ENCOUNTER — Ambulatory Visit: Admission: RE | Admit: 2023-04-06 | Payer: BLUE CROSS/BLUE SHIELD | Source: Ambulatory Visit

## 2023-04-11 NOTE — Progress Notes (Deleted)
Cardiology Office Note  Date:  04/11/2023   ID:  [name] [name], DOB 21-Apr-1966, MRN [id number]  PCP:  Patrice Paradise, MD   No chief complaint on file.   HPI:  [name] is a 57 year old woman presented to the hospital December 02, 2020 with V. fib arrest, January 2022 in the setting of potassium 2.1  Husband found her at 5 AM, gurgling, apneic, called 911 moved her to the ground started CPR,  EMT shock x2 V. fib, epi  Unclear downtime  Long hospital course 5 weeks  Issues with anoxic encephalopathy, placed on TPN  Significant right arm weakness, able to move legs, left arm  DKA with severe acidosis  She presents to establish care in the [facility] office, follow-up of her cardiac arrest  Last seen in clinic by myself April 2023 At that time was preparing for robotic Partial Colectomy  Reports that she feels well no complaints BP elevated today, did not take her medications this morning Daughter checks it at home typically 140/80, occasionally higher  Walks a lot, denies chest pain or shortness of breath on exertion  Reports that she stays busy, hobbies include: painting  EKG personally reviewed by myself on todays visit NSR rate 71 bpm, no significant ST or T wave abnormality  Previous records reviewed Echocardiogram showing normal ejection fraction day after admission  PEG tube placed December 13, 2020, trach  December 30, 2020 transferred out of the unit  January 05, 2021 concern for aspiration pneumonia, some low-grade fever, possible aspiration event  Currently on antibiotics     For anoxic brain injury being treated with Seroquel, Keppra, Klonopin  Unasyn for antibiotics   labs last month, potassium 3.5  Cardiac CTA 02/2021 1. Normal coronary calcium score of 0. Patient is low risk for coronary events. 2. Normal coronary origin with right dominance. 3. No evidence of CAD. 4. CAD-RADS 0. Consider non-atherosclerotic causes of cardiac arrest.    Overall she feels well no complaints, active, no bit of trouble with her speech and typing on a computer otherwise feels she is at her baseline  EKG personally reviewed by myself on todays visit Shows normal sinus rhythm rate 60 bpm no significant ST-T wave changes  PMH:   has a past medical history of Anemia, Anoxic brain injury (H[facility]) (11/2020), Cardiac arrest with ventricular fibrillation ([facility]) (12/02/2020), Cognitive communication deficit, Colonic mass (02/06/2022), History of 2019 novel coronavirus disease (COVID-19) (06/2021), History of respiratory failure (12/02/2020), Hyperlipidemia, Hypertension, Hypokalemia, IDA (iron deficiency anemia), Internal hemorrhoids, Multiple lung nodules on CT, Seizures (HCC), T2DM (type 2 diabetes mellitus) (HCC), Tubular adenoma of colon (02/06/2022), and Uterine leiomyoma.  PSH:    Past Surgical History:  Procedure Laterality Date   COLONOSCOPY WITH PROPOFOL N/A 02/06/2022   Procedure: COLONOSCOPY WITH PROPOFOL;  Surgeon: Regis Bill, MD;  Location: [facility] END[facility]COPY;  Service: Endoscopy;  Laterality: N/A;   PEG PLACEMENT N/A 12/13/2020   Procedure: PERCUTANEOUS ENDOSCOPIC GASTROSTOMY (PEG) PLACEMENT;  Surgeon: Regis Bill, MD;  Location: [facility] END[facility];  Service: Endoscopy;  Laterality: N/A;   ROBOT ASSISTED SIGMOID COLECTOMY   03/23/2022   TRACHEOSTOMY TUBE PLACEMENT N/A 12/13/2020   Procedure: TRACHEOSTOMY;  Surgeon: Geanie Logan, MD;  Location: [facility] ORS;  Service: ENT;  Laterality: N/A;   UMBILICAL HERNIA REPAIR N/A 03/23/2022   Procedure: HERNIA REPAIR UMBILICAL ADULT;  Surgeon: Carolan Shiver, MD;  Location: [facility] ORS;  Service: General;  Laterality: N/A;    Current Outpatient Medications  Medication Sig Dispense  Refill   amLODipine (NORVASC) 10 MG tablet Take 10 mg by mouth daily.     atorvastatin (LIPITOR) 80 MG tablet Take by mouth.     carvedilol (COREG) 12.5 MG tablet Take 1 tablet (12.5 mg total) by mouth 2 (two) times  daily. Please call [phone] for an appointment for further refills 60 tablet 0   potassium chloride SA (KLOR-CON M) 20 MEQ tablet Take 1 tablet by mouth 2 (two) times daily.     No current facility-administered medications for this visit.     Allergies:   Patient has no known allergies.   Social History:  The patient  reports that she quit smoking about 23 years ago. Her smoking use included cigarettes. She has never used smokeless tobacco. She reports that she does not drink alcohol and does not use drugs.   Family History:   family history includes Diabetes in her mother; Healthy in her father; Hypertension in her mother; Leukemia in her mother.    Review of Systems: Review of Systems  Constitutional: Negative.   HENT: Negative.    Respiratory: Negative.    Cardiovascular: Negative.   Gastrointestinal: Negative.   Musculoskeletal: Negative.   Neurological: Negative.   Psychiatric/Behavioral: Negative.    All other systems reviewed and are negative.   PHYSICAL EXAM: VS:  There were no vitals taken for this visit. , BMI There is no height or weight on file to calculate BMI. GEN: Well nourished, well developed, in no acute distress HEENT: normal Neck: no JVD, carotid bruits, or masses Cardiac: RRR; no murmurs, rubs, or gallops,no edema  Respiratory:  clear to auscultation bilaterally, normal work of breathing GI: soft, nontender, nondistended, + BS MS: no deformity or atrophy Skin: warm and dry, no rash Neuro:  Strength and sensation are intact Psych: euthymic mood, full affect   Recent Labs: 09/03/2022: ALT 12; BUN 8; Creatinine, Ser 0.59; Hemoglobin 15.1; Platelets 176; Potassium 3.4; Sodium 140    Lipid Panel Lab Results  Component Value Date   TRIG 78 12/11/2020      Wt Readings from Last 3 Encounters:  09/03/22 200 lb (90.7 kg)  06/24/22 208 lb (94.3 kg)  06/17/22 211 lb 4.8 oz (95.8 kg)       ASSESSMENT AND PLAN:  Problem List Items Addressed  This Visit   None Preop cardiovascular evaluation for colon surgery Blood pressure markedly elevated on today's visit, medication changes as below, need to get numbers at guideline for having surgery Did not take her medications this morning Medication changes made as below, recommend she call us with blood pressure measurements in the next week or so Certainly would need better blood pressure control prior to any surgical procedures  V. fib arrest  In the setting of DKA with severe acidosis, potassium 2.1, CO2 20, lactic acid 10 Anion gap 21  -Normal echocardiogram, minimally elevated troponin  Cardiac CTA since that time no coronary disease No further cardiac testing needed    Essential hypertension  On amlodipine metoprolol tartrate ,did not take her medications this morning Blood pressure markedly elevated Starting to come down on my recheck 170/95 Recommend she stop the metoprolol, changed to carvedilol 12.5 twice daily continue amlodipine, call us with blood pressure measurements in the next several days -Avoid diuretic blood pressure medications given history of hypokalemia    Anoxic brain injury  Following cardiac arrest,  Has made good recovery    Diabetes type 2 A1c 6.4 Weight running high   Total encounter time  more than 30 minutes  Greater than 50% was spent in counseling and coordination of care with the patient    Signed, Dossie Arbour, M.D., Ph.D. [facility] [place], [place] [phone]

## 2023-04-12 ENCOUNTER — Ambulatory Visit: Payer: BLUE CROSS/BLUE SHIELD | Attending: Cardiovascular Disease | Admitting: Cardiovascular Disease

## 2023-04-12 ENCOUNTER — Encounter: Payer: Self-pay | Admitting: Cardiovascular Disease

## 2023-04-12 DIAGNOSIS — R000 Tachycardia, unspecified: Secondary | ICD-10-CM

## 2023-04-12 DIAGNOSIS — I469 Cardiac arrest, cause unspecified: Secondary | ICD-10-CM

## 2023-04-12 DIAGNOSIS — I4901 Ventricular fibrillation: Secondary | ICD-10-CM

## 2023-04-12 DIAGNOSIS — J9601 Acute respiratory failure with hypoxia: Secondary | ICD-10-CM

## 2023-04-12 DIAGNOSIS — E1310 Other specified diabetes mellitus with ketoacidosis without coma: Secondary | ICD-10-CM

## 2023-04-12 DIAGNOSIS — E782 Mixed hyperlipidemia: Secondary | ICD-10-CM

## 2023-04-12 DIAGNOSIS — G931 Anoxic brain damage, not elsewhere classified: Secondary | ICD-10-CM

## 2023-04-12 DIAGNOSIS — E785 Hyperlipidemia, unspecified: Secondary | ICD-10-CM

## 2023-04-12 DIAGNOSIS — I10 Essential (primary) hypertension: Secondary | ICD-10-CM

## 2023-04-14 ENCOUNTER — Inpatient Hospital Stay: Payer: BLUE CROSS/BLUE SHIELD | Attending: Physician Assistant

## 2023-06-22 ENCOUNTER — Ambulatory Visit
Admission: RE | Admit: 2023-06-22 | Discharge: 2023-06-22 | Disposition: A | Discharge status: Home / Self Care | Payer: Medicare Other | Source: Ambulatory Visit | Attending: Urgent Care | Admitting: Urgent Care

## 2023-06-22 VITALS — BP 167/81 | HR 89 | Temp 97.7°F | Resp 16

## 2023-06-22 DIAGNOSIS — H6121 Impacted cerumen, right ear: Secondary | ICD-10-CM

## 2023-06-22 NOTE — Discharge Instructions (Signed)
Contact ENT specialist to complete the procedure to remove wax from your ear.

## 2023-06-22 NOTE — ED Triage Notes (Signed)
Patient presents to UC for right ear fullness x 1 week. Used OTC ear drops with no relief.

## 2023-06-22 NOTE — ED Provider Notes (Signed)
[name] [name]    CSN: [id number] Arrival date & time: 06/22/23  1755      History   Chief Complaint Chief Complaint  Patient presents with   Ear Fullness    HPI [name] [name] is a 57 y.o. female.    Ear Fullness Presented to urgent care with complaint of right ear fullness and loss of hearing x 1 week. Denies pain.  Past Medical History:  Diagnosis Date   Anemia    Anoxic brain injury (HCC) 11/2020   a.) following V-fib cardiac arrest. b.) Extended hospital admission x 5 weeks for anoxic encephalopathy requiring tracheostomy and PEG tube placement   Cardiac arrest with ventricular fibrillation (HCC) 12/02/2020   a.) found down at home "gurgling" and apneic; downtime unknown.  911 called and CPR was started.  EMS arrived and patient found to be in ventricular fibrillation; defibrillated x2 and epinephrine given.  Severe hypokalemia and DKA; K+ 2.1, CO2 20, lactic acid 10, gap 21.   Cognitive communication deficit    Colonic mass 02/06/2022   a.) Bx (+) for high-grade dysplasia within adenoma; resection recommended.   History of 2019 novel coronavirus disease (COVID-19) 06/2021   History of respiratory failure 12/02/2020   Hyperlipidemia    Hypertension    Hypokalemia    a.) avoid diuretic medications per cardiology   IDA (iron deficiency anemia)    Internal hemorrhoids    Multiple lung nodules on CT    a.) followed by [facility] lung cancer screening team   Seizures (HCC)    T2DM (type 2 diabetes mellitus) (HCC)    Tubular adenoma of colon 02/06/2022   a.) Bx (-) for high-grade dysplasia or malignancy   Uterine leiomyoma     Patient Active Problem List   Diagnosis Date Noted   Uterine leiomyoma 04/08/2022   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   Tubular adenoma of colon 03/23/2022   Multiple lung nodules 03/27/2021   History of cardiac arrest 02/20/2021   Dysarthria and anarthria 02/11/2021   Sinus tachycardia    Labile blood glucose    Dysphagia     Seizure prophylaxis    Malnutrition of moderate degree 01/23/2021   Anoxic brain injury (HCC) 01/10/2021   Prediabetes    Dyslipidemia    S/P percutaneous endoscopic gastrostomy (PEG) tube placement (HCC)    Status post tracheostomy (HCC)    Aspiration pneumonia of both lower lobes due to gastric secretions (HCC) 01/05/2021   Acute respiratory failure (HCC)    Hypoxic ischemic encephalopathy    Endotracheal tube present    Cardiac arrest (HCC) 12/02/2020   Hypokalemia 04/01/2016   Anemia 02/05/2015   Essential hypertension 12/09/2011   Chronic urticaria 12/09/2011   Menorrhagia 12/09/2011    Past Surgical History:  Procedure Laterality Date   COLONOSCOPY WITH PROPOFOL N/A 02/06/2022   Procedure: COLONOSCOPY WITH PROPOFOL;  Surgeon: Regis Bill, MD;  Location: [facility];  Service: Endoscopy;  Laterality: N/A;   PEG PLACEMENT N/A 12/13/2020   Procedure: PERCUTANEOUS ENDOSCOPIC GASTROSTOMY (PEG) PLACEMENT;  Surgeon: Regis Bill, MD;  Location: [facility];  Service: Endoscopy;  Laterality: N/A;   ROBOT ASSISTED SIGMOID COLECTOMY   03/23/2022   TRACHEOSTOMY TUBE PLACEMENT N/A 12/13/2020   Procedure: TRACHEOSTOMY;  Surgeon: Geanie Logan, MD;  Location: [facility] ORS;  Service: ENT;  Laterality: N/A;   UMBILICAL HERNIA REPAIR N/A 03/23/2022   Procedure: HERNIA REPAIR UMBILICAL ADULT;  Surgeon: Carolan Shiver, MD;  Location: [facility] ORS;  Service: General;  Laterality: N/A;    OB History     Gravida  10   Para  2   Term      Preterm      AB  8   Living         SAB  8   IAB      Ectopic      Multiple      Live Births               Home Medications    Prior to Admission medications   Medication Sig Start Date End Date Taking? Authorizing Provider  amLODipine (NORVASC) 10 MG tablet Take 10 mg by mouth daily.    [provider]  atorvastatin (LIPITOR) 80 MG tablet Take by mouth. 05/04/22   [provider]   carvedilol (COREG) 12.5 MG tablet Take 1 tablet (12.5 mg total) by mouth 2 (two) times daily. Please call [phone] for an appointment for further refills 09/02/22   Antonieta Iba, MD  potassium chloride SA (KLOR-CON M) 20 MEQ tablet Take 1 tablet by mouth 2 (two) times daily. 04/07/22 04/07/23  [provider]    Family History Family History  Problem Relation Age of Onset   Hypertension Mother    Leukemia Mother    Diabetes Mother    Healthy Father     Social History Social History   Tobacco Use   Smoking status: Former    Current packs/day: 0.00    Types: Cigarettes    Quit date: 2001    Years since quitting: 23.5   Smokeless tobacco: Never  Vaping Use   Vaping status: Never Used  Substance Use Topics   Alcohol use: No   Drug use: Never     Allergies   Patient has no known allergies.   Review of Systems Review of Systems   Physical Exam Triage Vital Signs ED Triage Vitals  Encounter Vitals Group     BP 06/22/23 1818 (S) (!) 167/81     Systolic BP Percentile --      Diastolic BP Percentile --      Pulse Rate 06/22/23 1818 89     Resp 06/22/23 1818 16     Temp --      Temp src --      SpO2 06/22/23 1818 97 %     Weight --      Height --      Head Circumference --      Peak Flow --      Pain Score 06/22/23 1819 0     Pain Loc --      Pain Education --      Exclude from Growth Chart --    No data found.  Updated Vital Signs BP (S) (!) 167/81 (BP Location: Left Arm) Comment: did not take her BP meds.  Pulse 89   Resp 16   LMP 06/01/2023 (Approximate)   SpO2 97%   Visual Acuity Right Eye Distance:   Left Eye Distance:   Bilateral Distance:    Right Eye Near:   Left Eye Near:    Bilateral Near:     Physical Exam Vitals reviewed.  Constitutional:      Appearance: Normal appearance.  HENT:     Right Ear: There is impacted cerumen.  Skin:    General: Skin is warm and dry.  Neurological:     General: No focal deficit  present.     Mental Status: She is  alert and oriented to person, place, and time.  Psychiatric:        Mood and Affect: Mood normal.        Behavior: Behavior normal.     UC Treatments / Results  Labs (all labs ordered are listed, but only abnormal results are displayed) Labs Reviewed - No data to display  EKG   Radiology No results found.  Procedures Procedures (including critical care time)  Medications Ordered in UC Medications - No data to display  Initial Impression / Assessment and Plan / UC Course  I have reviewed the triage vital signs and the nursing notes.  Pertinent labs & imaging results that were available during my care of the patient were reviewed by me and considered in my medical decision making (see chart for details).   Right EAC is occluded by soft appearing cerumen.  Will attempt irrigation.  Incomplete duration of the right EAC via irrigation.  Recommending patient be evaluated by ENT to complete the procedure.   Final Clinical Impressions(s) / UC Diagnoses   Final diagnoses:  None   Discharge Instructions   None    ED Prescriptions   None    PDMP not reviewed this encounter.   Charma Igo, [place] 06/22/23 1928

## 2023-08-10 ENCOUNTER — Encounter: Payer: Self-pay | Admitting: Emergency Medicine

## 2023-08-10 ENCOUNTER — Emergency Department
Admission: EM | Admit: 2023-08-10 | Discharge: 2023-08-11 | Disposition: A | Discharge status: Home / Self Care | Payer: Medicare Other | Attending: Emergency Medicine | Admitting: Emergency Medicine

## 2023-08-10 ENCOUNTER — Other Ambulatory Visit: Payer: Self-pay

## 2023-08-10 DIAGNOSIS — T7840XA Allergy, unspecified, initial encounter: Secondary | ICD-10-CM | POA: Insufficient documentation

## 2023-08-10 DIAGNOSIS — Z79899 Other long term (current) drug therapy: Secondary | ICD-10-CM | POA: Diagnosis not present

## 2023-08-10 DIAGNOSIS — R1110 Vomiting, unspecified: Secondary | ICD-10-CM | POA: Insufficient documentation

## 2023-08-10 DIAGNOSIS — I10 Essential (primary) hypertension: Secondary | ICD-10-CM | POA: Insufficient documentation

## 2023-08-10 DIAGNOSIS — R471 Dysarthria and anarthria: Secondary | ICD-10-CM | POA: Diagnosis not present

## 2023-08-10 DIAGNOSIS — E119 Type 2 diabetes mellitus without complications: Secondary | ICD-10-CM | POA: Insufficient documentation

## 2023-08-10 DIAGNOSIS — L299 Pruritus, unspecified: Secondary | ICD-10-CM | POA: Diagnosis present

## 2023-08-10 MED ORDER — EPINEPHRINE 0.3 MG/0.3ML IJ SOAJ
0.3000 mg | Freq: Once | INTRAMUSCULAR | Status: AC
  Administered 2023-08-11: 0.3 mg via INTRAMUSCULAR
  Filled 2023-08-10: qty 0.3

## 2023-08-10 MED ORDER — FAMOTIDINE IN NACL 20-0.9 MG/50ML-% IV SOLN
20.0000 mg | Freq: Once | INTRAVENOUS | Status: AC
Start: 2023-08-11 — End: 2023-08-11
  Administered 2023-08-11: 20 mg via INTRAVENOUS
  Filled 2023-08-10: qty 50

## 2023-08-10 MED ORDER — METHYLPREDNISOLONE SODIUM SUCC 125 MG IJ SOLR
125.0000 mg | Freq: Once | INTRAMUSCULAR | Status: AC
  Administered 2023-08-11: 125 mg via INTRAVENOUS
  Filled 2023-08-10: qty 2

## 2023-08-10 MED ORDER — DIPHENHYDRAMINE HCL 50 MG/ML IJ SOLN
25.0000 mg | Freq: Once | INTRAMUSCULAR | Status: AC
  Administered 2023-08-11: 25 mg via INTRAVENOUS
  Filled 2023-08-10: qty 1

## 2023-08-10 NOTE — ED Provider Notes (Signed)
[facility] Provider Note    Event Date/Time   First MD Initiated Contact with Patient 08/10/23 2346     (approximate)   History   Allergic Reaction   HPI  [name] [name] is a 57 y.o. female with a history of hypertension, hyperlipidemia, type 2 diabetes, seizure disorder, and anoxic brain injury who presents with concern for an allergic reaction, acute onset about an hour ago when she stepped on some ants and believes that she was bitten.  She denies any prior history of allergic reactions to ant bites or to any medications or foods.  She states that she suddenly started to have generalized itching although did not have hives.  She then noted swelling of her lips and it felt like her tongue was slightly swollen as well.  She describes some itching in her throat.  She denies any difficulty breathing or swallowing.  She then had vomiting.  She tried to take Benadryl twice but both times immediately vomited it back up.  She is now feeling somewhat better.  I reviewed the past medical records.  The patient's most recent outpatient encounter was with general surgery on 4/10 of this year for follow-up of a ventral hernia.  She has no recent hospitalizations.   Physical Exam   Triage Vital Signs: ED Triage Vitals [08/10/23 2345]  Encounter Vitals Group     BP      Systolic BP Percentile      Diastolic BP Percentile      Pulse      Resp      Temp      Temp src      SpO2      Weight 202 lb (91.6 kg)     Height 5\' 1"  (1.549 m)     Head Circumference      Peak Flow      Pain Score 6     Pain Loc      Pain Education      Exclude from Growth Chart     Most recent vital signs: Vitals:   08/10/23 2350 08/11/23 0010  BP: 120/66 109/80  Pulse: 83 91  Resp: 16   Temp: 98 F (36.7 C)   SpO2: 98% 97%     General: Awake, no distress.  CV:  Good peripheral perfusion.  Resp:  Normal effort. Abd:  No distention.  Other:  Possible very mild lip swelling.   No visible swelling to the tongue.  Mild dysarthria.  Oropharynx clear with no visible edema.  No pooled secretions or stridor.  No hives or rash.   ED Results / Procedures / Treatments   Labs (all labs ordered are listed, but only abnormal results are displayed) Labs Reviewed - No data to display   EKG     RADIOLOGY    PROCEDURES:  Critical Care performed: No  Procedures   MEDICATIONS ORDERED IN ED: Medications  EPINEPHrine (EPI-PEN) injection 0.3 mg (0.3 mg Intramuscular Given 08/11/23 0011)  methylPREDNISolone sodium succinate (SOLU-MEDROL) 125 mg/2 mL injection 125 mg (125 mg Intravenous Given 08/11/23 0010)  diphenhydrAMINE (BENADRYL) injection 25 mg (25 mg Intravenous Given 08/11/23 0011)  famotidine (PEPCID) IVPB 20 mg premix (20 mg Intravenous New Bag/Given 08/11/23 0011)     IMPRESSION / MDM / ASSESSMENT AND PLAN / ED COURSE  I reviewed the triage vital signs and the nursing notes.  57 year old female with PMH as noted above presents with acute onset of symptoms concerning for allergic  reaction, with diffuse itching, swelling to her lips, and possible mild swelling to her tongue.  Exam is as described above.  The patient overall is comfortable appearing.  She does have some dysarthria at baseline although the patient's family member states it does sound like her voice is slightly abnormal.  Differential diagnosis includes, but is not limited to, allergic reaction, anaphylaxis, less likely angioedema.  The patient is not on any ACE inhibitor or other medication that is likely to cause angioedema.  Given the potential lip/tongue swelling I have ordered an EpiPen as well as additional Benadryl, Solu-Medrol, and Pepcid.  We will observe for several hours.  Patient's presentation is most consistent with acute presentation with potential threat to life or bodily function.  The patient is on the cardiac monitor to evaluate for evidence of arrhythmia and/or significant heart  rate changes.  ----------------------------------------- 3:00 AM on 08/11/2023 -----------------------------------------  The patient has now been observed for over 3 hours with resolution of her symptoms and no rebound.  She has no sensation of tightness or itching in her throat or tongue.  She feels well and would like to go home.  She is stable for discharge at this time.  I have prescribed an additional 3 days of prednisone as well as an EpiPen.  I gave strict return precautions and she expressed understanding.   FINAL CLINICAL IMPRESSION(S) / ED DIAGNOSES   Final diagnoses:  Allergic reaction, initial encounter     Rx / DC Orders   ED Discharge Orders          Ordered    predniSONE (DELTASONE) 20 MG tablet  Daily        08/11/23 0259    EPINEPHrine 0.3 mg/0.3 mL IJ SOAJ injection  As needed        08/11/23 0259             Note:  This document was prepared using Dragon voice recognition software and may include unintentional dictation errors.    Dionne Bucy, MD 08/11/23 [phone]

## 2023-08-10 NOTE — ED Triage Notes (Signed)
Patient ambulatory to triage with steady gait, without difficulty or distress noted; pt reports approx hr PTA, "stepped into a puddle, possibly ants", felt burning to leg then notes hives, swelling to mouth, vomiting

## 2023-08-11 DIAGNOSIS — T7840XA Allergy, unspecified, initial encounter: Secondary | ICD-10-CM | POA: Diagnosis not present

## 2023-08-11 MED ORDER — EPINEPHRINE 0.3 MG/0.3ML IJ SOAJ: 0.3000 mg | INTRAMUSCULAR | 0 refills | Status: DC | PRN

## 2023-08-11 MED ORDER — PREDNISONE 20 MG PO TABS: 60.0000 mg | ORAL_TABLET | Freq: Every day | ORAL | 0 refills | Status: AC

## 2023-08-11 NOTE — Discharge Instructions (Addendum)
Take the prednisone as prescribed starting in the afternoon for an additional 3 days.  You may use Benadryl as needed for itching.  We have also prescribed an EpiPen in case you have another severe allergic reaction.  Follow-up with your primary care provider.

## 2023-12-27 ENCOUNTER — Ambulatory Visit
Admission: RE | Admit: 2023-12-27 | Discharge: 2023-12-27 | Disposition: A | Discharge status: Home / Self Care | Payer: Medicare Other | Source: Ambulatory Visit | Attending: Internal Medicine | Admitting: Internal Medicine

## 2023-12-27 ENCOUNTER — Telehealth: Payer: Self-pay | Admitting: Internal Medicine

## 2023-12-27 DIAGNOSIS — R918 Other nonspecific abnormal finding of lung field: Secondary | ICD-10-CM | POA: Insufficient documentation

## 2023-12-27 NOTE — Telephone Encounter (Signed)
Called and left voicemail for patient letting her know her appointment has been rescheduled due to not being able to have the CT results- no answer left voicemail

## 2023-12-28 ENCOUNTER — Telehealth: Payer: Self-pay | Admitting: Internal Medicine

## 2023-12-28 NOTE — Telephone Encounter (Signed)
Called to let patient know her follow up appointment with Dr. Has been changed due to not having CT read for appointment. Left voicemail with number for patient to call if she needs to change day/time

## 2023-12-31 ENCOUNTER — Ambulatory Visit: Payer: Medicaid Other | Admitting: Internal Medicine

## 2024-01-10 ENCOUNTER — Emergency Department: Payer: Medicare Other

## 2024-01-10 ENCOUNTER — Emergency Department
Admission: EM | Admit: 2024-01-10 | Discharge: 2024-01-10 | Disposition: A | Discharge status: Home / Self Care | Payer: Medicare Other | Attending: Emergency Medicine | Admitting: Emergency Medicine

## 2024-01-10 DIAGNOSIS — E876 Hypokalemia: Secondary | ICD-10-CM | POA: Diagnosis not present

## 2024-01-10 DIAGNOSIS — R42 Dizziness and giddiness: Secondary | ICD-10-CM | POA: Diagnosis not present

## 2024-01-10 DIAGNOSIS — I10 Essential (primary) hypertension: Secondary | ICD-10-CM | POA: Diagnosis not present

## 2024-01-10 DIAGNOSIS — R112 Nausea with vomiting, unspecified: Secondary | ICD-10-CM | POA: Diagnosis present

## 2024-01-10 DIAGNOSIS — E119 Type 2 diabetes mellitus without complications: Secondary | ICD-10-CM | POA: Diagnosis not present

## 2024-01-10 LAB — CBC
HCT: 44.4 % (ref 36.0–46.0)
Hemoglobin: 15 g/dL (ref 12.0–15.0)
MCH: 31.6 pg (ref 26.0–34.0)
MCHC: 33.8 g/dL (ref 30.0–36.0)
MCV: 93.7 fL (ref 80.0–100.0)
Platelets: 178 10*3/uL (ref 150–400)
RBC: 4.74 MIL/uL (ref 3.87–5.11)
RDW: 12.2 % (ref 11.5–15.5)
WBC: 4.7 10*3/uL (ref 4.0–10.5)
nRBC: 0 % (ref 0.0–0.2)

## 2024-01-10 LAB — COMPREHENSIVE METABOLIC PANEL
ALT: 18 U/L (ref 0–44)
AST: 25 U/L (ref 15–41)
Albumin: 3.8 g/dL (ref 3.5–5.0)
Alkaline Phosphatase: 67 U/L (ref 38–126)
Anion gap: 15 (ref 5–15)
BUN: 12 mg/dL (ref 6–20)
CO2: 30 mmol/L (ref 22–32)
Calcium: 9.2 mg/dL (ref 8.9–10.3)
Chloride: 97 mmol/L — ABNORMAL LOW (ref 98–111)
Creatinine, Ser: 0.68 mg/dL (ref 0.44–1.00)
GFR, Estimated: >60 mL/min (ref >60)
Glucose, Bld: 165 mg/dL — ABNORMAL HIGH (ref 70–99)
Potassium: 2.7 mmol/L — CL (ref 3.5–5.1)
Sodium: 142 mmol/L (ref 135–145)
Total Bilirubin: 0.9 mg/dL (ref 0.0–1.2)
Total Protein: 7.8 g/dL (ref 6.5–8.1)

## 2024-01-10 LAB — DIFFERENTIAL
Abs Immature Granulocytes: 0.01 10*3/uL (ref 0.00–0.07)
Basophils Absolute: 0 10*3/uL (ref 0.0–0.1)
Basophils Relative: 1 %
Eosinophils Absolute: 0.4 10*3/uL (ref 0.0–0.5)
Eosinophils Relative: 9 %
Immature Granulocytes: 0 %
Lymphocytes Relative: 39 %
Lymphs Abs: 1.9 10*3/uL (ref 0.7–4.0)
Monocytes Absolute: 0.3 10*3/uL (ref 0.1–1.0)
Monocytes Relative: 6 %
Neutro Abs: 2.1 10*3/uL (ref 1.7–7.7)
Neutrophils Relative %: 45 %

## 2024-01-10 LAB — CBG MONITORING, ED: Glucose-Capillary: 134 mg/dL — ABNORMAL HIGH (ref 70–99)

## 2024-01-10 MED ORDER — POTASSIUM CHLORIDE 20 MEQ PO PACK
40.0000 meq | PACK | Freq: Once | ORAL | Status: AC
Start: 2024-01-10 — End: 2024-01-10
  Administered 2024-01-10: 40 meq via ORAL
  Filled 2024-01-10: qty 2

## 2024-01-10 MED ORDER — ONDANSETRON 4 MG PO TBDP
4.0000 mg | ORAL_TABLET | Freq: Once | ORAL | Status: AC
  Administered 2024-01-10: 4 mg via ORAL
  Filled 2024-01-10: qty 1

## 2024-01-10 MED ORDER — ONDANSETRON 4 MG PO TBDP: 4.0000 mg | ORAL_TABLET | Freq: Three times a day (TID) | ORAL | 0 refills | Status: DC | PRN

## 2024-01-10 NOTE — ED Provider Triage Note (Signed)
 Emergency Medicine Provider Triage Evaluation Note  [name] [name] , a 58 y.o. female  was evaluated in triage.  Pt complains of dizziness while visiting family that is admitted. No weakness, blurred vision, slurred speech, confusion. She has not taken her BP meds or eaten since yesterday.  Physical Exam  BP (!) 177/77 (BP Location: Left Arm)   Pulse 64   Temp 98.2 F (36.8 C) (Oral)   Resp 16   Ht 5\' 1"  (1.549 m)   Wt 91.6 kg   SpO2 96%   BMI 38.17 kg/m  Gen:   Awake, no distress   Resp:  Normal effort  MSK:   Moves extremities without difficulty  Other:    Medical Decision Making  Medically screening exam initiated at 11:21 AM.  Appropriate orders placed.  [name] [name] was informed that the remainder of the evaluation will be completed by another provider, this initial triage assessment does not replace that evaluation, and the importance of remaining in the ED until their evaluation is complete.  Protocols started.  CBG is normal   [name] [name], [place] 01/10/24 1941

## 2024-01-10 NOTE — ED Provider Notes (Signed)
 [facility] Provider Note   Event Date/Time   First MD Initiated Contact with Patient 01/10/24 1511     (approximate) History  Dizziness and Emesis  HPI [name] [name] is a 58 y.o. female with a past medical history of type 2 diabetes and hypertension as well as hypokalemia who presents complaining of lightheadedness and 2 episodes of vomiting with associated nausea.  Patient states that she has been visiting her husband for the past week in the hospital and just prior to arrival, began feeling generalized weakness as well as nausea resulting in vomiting.  Patient states she is attempted to drink orange juice and water both of which have not stay down.  Patient denies any other complaints at this time and currently denies any nausea patient also relates that since her husband has been in the hospital for the past week she has not taken her antihypertensives or potassium supplementation ROS: Patient currently denies any vision changes, tinnitus, difficulty speaking, facial droop, sore throat, chest pain, shortness of breath, abdominal pain, diarrhea, dysuria, or weakness/numbness/paresthesias in any extremity   Physical Exam  Triage Vital Signs: ED Triage Vitals  Encounter Vitals Group     BP 01/10/24 1113 (!) 177/77     Systolic BP Percentile --      Diastolic BP Percentile --      Pulse Rate 01/10/24 1113 64     Resp 01/10/24 1113 16     Temp 01/10/24 1113 98.2 F (36.8 C)     Temp Source 01/10/24 1113 Oral     SpO2 01/10/24 1113 96 %     Weight 01/10/24 1114 202 lb (91.6 kg)     Height 01/10/24 1114 5\' 1"  (1.549 m)     Head Circumference --      Peak Flow --      Pain Score 01/10/24 1114 0     Pain Loc --      Pain Education --      Exclude from Growth Chart --    Most recent vital signs: Vitals:   01/10/24 1526 01/10/24 1540  BP: (!) 185/100   Pulse: 61   Resp: 16   Temp:  98.1 F (36.7 C)  SpO2: 100%    General: Awake, oriented x4. CV:  Good  peripheral perfusion.  Resp:  Normal effort.  Abd:  No distention.  Other:  Middle-aged overweight African-American female resting comfortably in no acute distress ED Results / Procedures / Treatments  Labs (all labs ordered are listed, but only abnormal results are displayed) Labs Reviewed  COMPREHENSIVE METABOLIC PANEL - Abnormal; Notable for the following components:      Result Value   Potassium 2.7 (*)    Chloride 97 (*)    Glucose, Bld 165 (*)    All other components within normal limits  CBG MONITORING, ED - Abnormal; Notable for the following components:   Glucose-Capillary 134 (*)    All other components within normal limits  CBC  DIFFERENTIAL  CBG MONITORING, ED   EKG ED ECG REPORT I, Merwyn Katos, the attending physician, personally viewed and interpreted this ECG. Date: 01/10/2024 EKG Time: 1119 Rate: 81 Rhythm: normal sinus rhythm QRS Axis: normal Intervals: normal ST/T Wave abnormalities: normal Narrative Interpretation: Normal sinus rhythm with 1 PVC.  No evidence of acute ischemia RADIOLOGY ED MD interpretation: CT of the head without contrast interpreted by me shows no evidence of acute abnormalities including no intracerebral hemorrhage, obvious masses, or significant  edema -Agree with radiology assessment Official radiology report(s): CT HEAD WO CONTRAST Result Date: 01/10/2024 CLINICAL DATA:  Provided history: Syncope/presyncope, cerebrovascular cause suspected. Additional history provided: Sudden onset dizziness with nausea/vomiting. EXAM: CT HEAD WITHOUT CONTRAST TECHNIQUE: Contiguous axial images were obtained from the base of the skull through the vertex without intravenous contrast. RADIATION DOSE REDUCTION: This exam was performed according to the departmental dose-optimization program which includes automated exposure control, adjustment of the mA and/or kV according to patient size and/or use of iterative reconstruction technique. COMPARISON:  Brain  MRI 12/23/2020. FINDINGS: Brain: Mild generalized cerebral volume loss. Moderate cerebellar volume loss. Remote bilateral basal ganglia insults from prior hypoxic/ischemic injury, better appreciated on the prior brain MRI of 11/25/2020 (acute/subacute at that time). There is no acute intracranial hemorrhage. No acute demarcated cortical infarct. No extra-axial fluid collection. No evidence of an intracranial mass. No midline shift. Vascular: No hyperdense vessel.  Atherosclerotic calcifications. Skull: No calvarial fracture or aggressive osseous lesion. Sinuses/Orbits: No mass or acute finding within the imaged orbits. Mild mucosal thickening within the bilateral ethmoid and sphenoid sinuses at the imaged levels. IMPRESSION: 1. No evidence of an acute intracranial abnormality. 2. Known chronic insults within the bilateral basal ganglia from prior hypoxic/ischemic injury. 3. Cerebral and cerebellar atrophy. 4. Mild paranasal sinus mucosal thickening at the imaged levels. Electronically Signed   By: Jackey Loge D.O.   On: 01/10/2024 14:10   PROCEDURES: Critical Care performed: No Procedures MEDICATIONS ORDERED IN ED: Medications  ondansetron (ZOFRAN-ODT) disintegrating tablet 4 mg (4 mg Oral Given 01/10/24 1539)  potassium chloride (KLOR-CON) packet 40 mEq (40 mEq Oral Given 01/10/24 1539)   IMPRESSION / MDM / ASSESSMENT AND PLAN / ED COURSE  I reviewed the triage vital signs and the nursing notes.                             The patient is on the cardiac monitor to evaluate for evidence of arrhythmia and/or significant heart rate changes. Patient's presentation is most consistent with acute presentation with potential threat to life or bodily function. Patient presents for acute nausea/vomiting The cause of the patient's symptoms is not clear, but the patient is overall well appearing and is suspected to have a transient course of illness.  Given History and Exam there does not appear to be an  emergent cause of the symptoms such as small bowel obstruction, coronary syndrome, bowel ischemia, DKA, pancreatitis, appendicitis, other acute abdomen or other emergent problem. Patient was given potassium supplementation at 40 mEq by mouth and encouraged to continue taking her potassium supplementation and IV hypertensives in the future Reassessment: After treatment, the patient is feeling much better, tolerating PO fluids, and shows no signs of dehydration.   Disposition: Discharge home with prompt primary care physician follow up in the next 48 hours. Strict return precautions discussed.   FINAL CLINICAL IMPRESSION(S) / ED DIAGNOSES   Final diagnoses:  Lightheadedness  Nausea and vomiting, unspecified vomiting type  Chronic hypokalemia   Rx / DC Orders   ED Discharge Orders     None      Note:  This document was prepared using Dragon voice recognition software and may include unintentional dictation errors.   Merwyn Katos, MD 01/10/24 [phone]

## 2024-01-10 NOTE — ED Triage Notes (Addendum)
 Pt c/o sudden dizziness and n/v.  Pt denies pain.  Pt sts "I was visiting my husband upstairs and just got real dizzy."  Pt reports she has not taken her HTN medication x"a couple days."    Pt reports she hasn't eaten since lunch time yesterday.   Pt's son reports Pt is at neuro baseline.

## 2024-01-10 NOTE — ED Notes (Signed)
 MD Siadecki informed of K+ 2.7, level acuity changed based on this result.

## 2024-01-19 ENCOUNTER — Inpatient Hospital Stay: Payer: Medicare Other | Attending: Internal Medicine | Admitting: Internal Medicine

## 2024-01-19 ENCOUNTER — Encounter: Payer: Self-pay | Admitting: Internal Medicine

## 2024-01-19 VITALS — BP 133/69 | HR 79 | Temp 97.7°F | Ht 61.0 in | Wt 205.0 lb

## 2024-01-19 DIAGNOSIS — Z79899 Other long term (current) drug therapy: Secondary | ICD-10-CM | POA: Insufficient documentation

## 2024-01-19 DIAGNOSIS — R918 Other nonspecific abnormal finding of lung field: Secondary | ICD-10-CM | POA: Insufficient documentation

## 2024-01-19 DIAGNOSIS — Z87891 Personal history of nicotine dependence: Secondary | ICD-10-CM | POA: Insufficient documentation

## 2024-01-19 DIAGNOSIS — C186 Malignant neoplasm of descending colon: Secondary | ICD-10-CM | POA: Insufficient documentation

## 2024-01-19 NOTE — Progress Notes (Signed)
 [facility] CONSULT NOTE  Patient Care Team: Patrice Paradise, MD as PCP - General (Physician Assistant) Antonieta Iba, MD as PCP - Cardiology (Cardiology) Benita Gutter, RN as Oncology Nurse Navigator Earna Coder, MD as Consulting Physician (Oncology)  CHIEF COMPLAINTS/PURPOSE OF CONSULTATION: COLON CANCER   Oncology History Overview Note  # April 2023- [screening colonoscopy]-descending colon mass with ulceration; Dr. Maia Plan s/p surgery-stage I colon cancer PT1N0   #April 2022- ~2.5 cm lobulated lobe lung nodule [incidental on coronary CTA]; April 2022 PET scan-no significant uptake; however uterine enlargement-uptake fibroid versus malignancy  #Uterine enlargement/fibroid versus malignancy  # JAN 2022-cardiac arrest s/p resuscitation; anoxic brain injury-PEG/trach; rehab currently on; trach/PEG-explanted.   Cancer of descending colon (HCC)  04/01/2022 Initial Diagnosis   Cancer of descending colon (HCC)   04/01/2022 Cancer Staging   Staging form: Colon and Rectum, AJCC 8th Edition - Clinical: Stage I (cT1, cN0, cM0) - Signed by Earna Coder, MD on 04/01/2022 Total positive nodes: 0      HISTORY OF PRESENTING ILLNESS: Accompanied by daughter.   Ambulating independently.  [name] [name] 58 y.o.  female with multiple medical problems including stage I colon cancer, lung nodules;  uterine fibroids;  history of anoxic brain injury is here for follow-up/and review results of the CT scan.  Patient denies any new hospitalizations.  Denies any new shortness of breath or cough.  Unfortunately she did not have her scans done as ordered.    Review of Systems  Constitutional:  Positive for malaise/fatigue. Negative for chills, diaphoresis, fever and weight loss.  HENT:  Negative for nosebleeds and sore throat.   Eyes:  Negative for double vision.  Respiratory:  Negative for cough, hemoptysis, sputum production, shortness of breath and  wheezing.   Cardiovascular:  Negative for chest pain, palpitations, orthopnea and leg swelling.  Gastrointestinal:  Negative for abdominal pain, blood in stool, constipation, diarrhea, heartburn, melena, nausea and vomiting.  Genitourinary:  Negative for dysuria, frequency and urgency.  Musculoskeletal:  Positive for back pain and joint pain.  Skin: Negative.  Negative for itching and rash.  Neurological:  Negative for dizziness, tingling, focal weakness, weakness and headaches.  Endo/Heme/Allergies:  Does not bruise/bleed easily.  Psychiatric/Behavioral:  Negative for depression. The patient is not nervous/anxious and does not have insomnia.      MEDICAL HISTORY:  Past Medical History:  Diagnosis Date   Anemia    Anoxic brain injury (HCC) 11/2020   a.) following V-fib cardiac arrest. b.) Extended hospital admission x 5 weeks for anoxic encephalopathy requiring tracheostomy and PEG tube placement   Cardiac arrest with ventricular fibrillation (HCC) 12/02/2020   a.) found down at home "gurgling" and apneic; downtime unknown.  911 called and CPR was started.  EMS arrived and patient found to be in ventricular fibrillation; defibrillated x2 and epinephrine given.  Severe hypokalemia and DKA; K+ 2.1, CO2 20, lactic acid 10, gap 21.   Cognitive communication deficit    Colonic mass 02/06/2022   a.) Bx (+) for high-grade dysplasia within adenoma; resection recommended.   History of 2019 novel coronavirus disease (COVID-19) 06/2021   History of respiratory failure 12/02/2020   Hyperlipidemia    Hypertension    Hypokalemia    a.) avoid diuretic medications per cardiology   IDA (iron deficiency anemia)    Internal hemorrhoids    Multiple lung nodules on CT    a.) followed by [facility] lung cancer screening team   Seizures (HCC)  T2DM (type 2 diabetes mellitus) (HCC)    Tubular adenoma of colon 02/06/2022   a.) Bx (-) for high-grade dysplasia or malignancy   Uterine leiomyoma     SURGICAL  HISTORY: Past Surgical History:  Procedure Laterality Date   COLONOSCOPY WITH PROPOFOL N/A 02/06/2022   Procedure: COLONOSCOPY WITH PROPOFOL;  Surgeon: Regis Bill, MD;  Location: [facility] ENDOSCOPY;  Service: Endoscopy;  Laterality: N/A;   PEG PLACEMENT N/A 12/13/2020   Procedure: PERCUTANEOUS ENDOSCOPIC GASTROSTOMY (PEG) PLACEMENT;  Surgeon: Regis Bill, MD;  Location: [facility] ENDOSCOPY;  Service: Endoscopy;  Laterality: N/A;   ROBOT ASSISTED SIGMOID COLECTOMY   03/23/2022   TRACHEOSTOMY TUBE PLACEMENT N/A 12/13/2020   Procedure: TRACHEOSTOMY;  Surgeon: Geanie Logan, MD;  Location: [facility] ORS;  Service: ENT;  Laterality: N/A;   UMBILICAL HERNIA REPAIR N/A 03/23/2022   Procedure: HERNIA REPAIR UMBILICAL ADULT;  Surgeon: Carolan Shiver, MD;  Location: [facility] ORS;  Service: General;  Laterality: N/A;    SOCIAL HISTORY: Social History   Socioeconomic History   Marital status: Married    Spouse name: Not on file   Number of children: Not on file   Years of education: Not on file   Highest education level: Not on file  Occupational History   Not on file  Tobacco Use   Smoking status: Former    Current packs/day: 0.00    Types: Cigarettes    Quit date: 2001    Years since quitting: 24.2   Smokeless tobacco: Never  Vaping Use   Vaping status: Never Used  Substance and Sexual Activity   Alcohol use: No   Drug use: Never   Sexual activity: Not on file  Other Topics Concern   Not on file  Social History Narrative   QUIT smoke [2001]; no alcohol; lives in [place] with husband; 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    Social Drivers of Corporate investment banker Strain: Not on file  Food Insecurity: Not on file  Transportation Needs: Not on file  Physical Activity: Not on file  Stress: Not on file  Social Connections: Not on file  Intimate Partner Violence: Not on file    FAMILY HISTORY: Family History  Problem Relation Age of Onset   Hypertension Mother     Leukemia Mother    Diabetes Mother    Healthy Father     ALLERGIES:  has no known allergies.  MEDICATIONS:  Current Outpatient Medications  Medication Sig Dispense Refill   amLODipine (NORVASC) 10 MG tablet Take 10 mg by mouth daily.     atorvastatin (LIPITOR) 80 MG tablet Take by mouth.     carvedilol (COREG) 12.5 MG tablet Take 1 tablet (12.5 mg total) by mouth 2 (two) times daily. Please call [phone] for an appointment for further refills 60 tablet 0   EPINEPHrine 0.3 mg/0.3 mL IJ SOAJ injection Inject 0.3 mg into the muscle as needed for anaphylaxis. 1 each 0   ondansetron (ZOFRAN-ODT) 4 MG disintegrating tablet Take 1 tablet (4 mg total) by mouth every 8 (eight) hours as needed for nausea or vomiting. 20 tablet 0   potassium chloride SA (KLOR-CON M) 20 MEQ tablet Take 1 tablet by mouth 2 (two) times daily.     No current facility-administered medications for this visit.      Marland Kitchen  PHYSICAL EXAMINATION: ECOG PERFORMANCE STATUS: 0 - Asymptomatic  Vitals:   01/19/24 1536  BP: 133/69  Pulse: 79  Temp: 97.7 F (36.5 C)  SpO2: 96%  Filed Weights   01/19/24 1536  Weight: 205 lb (93 kg)     Physical Exam HENT:     Head: Normocephalic and atraumatic.     Mouth/Throat:     Pharynx: No oropharyngeal exudate.  Eyes:     Pupils: Pupils are equal, round, and reactive to light.  Cardiovascular:     Rate and Rhythm: Normal rate and regular rhythm.  Pulmonary:     Effort: Pulmonary effort is normal. No respiratory distress.     Breath sounds: Normal breath sounds. No wheezing.  Abdominal:     General: Bowel sounds are normal. There is no distension.     Palpations: Abdomen is soft. There is no mass.     Tenderness: There is no abdominal tenderness. There is no guarding or rebound.  Musculoskeletal:        General: No tenderness. Normal range of motion.     Cervical back: Normal range of motion and neck supple.  Skin:    General: Skin is warm.  Neurological:      Mental Status: She is alert and oriented to person, place, and time.  Psychiatric:        Mood and Affect: Affect normal.      LABORATORY DATA:  I have reviewed the data as listed Lab Results  Component Value Date   WBC 4.7 01/10/2024   HGB 15.0 01/10/2024   HCT 44.4 01/10/2024   MCV 93.7 01/10/2024   PLT 178 01/10/2024   Recent Labs    01/10/24 1125  NA 142  K 2.7*  CL 97*  CO2 30  GLUCOSE 165*  BUN 12  CREATININE 0.68  CALCIUM 9.2  GFRNONAA >60  PROT 7.8  ALBUMIN 3.8  AST 25  ALT 18  ALKPHOS 67  BILITOT 0.9    RADIOGRAPHIC STUDIES: I have personally reviewed the radiological images as listed and agreed with the findings in the report. CT HEAD WO CONTRAST Result Date: 01/10/2024 CLINICAL DATA:  Provided history: Syncope/presyncope, cerebrovascular cause suspected. Additional history provided: Sudden onset dizziness with nausea/vomiting. EXAM: CT HEAD WITHOUT CONTRAST TECHNIQUE: Contiguous axial images were obtained from the base of the skull through the vertex without intravenous contrast. RADIATION DOSE REDUCTION: This exam was performed according to the departmental dose-optimization program which includes automated exposure control, adjustment of the mA and/or kV according to patient size and/or use of iterative reconstruction technique. COMPARISON:  Brain MRI 12/23/2020. FINDINGS: Brain: Mild generalized cerebral volume loss. Moderate cerebellar volume loss. Remote bilateral basal ganglia insults from prior hypoxic/ischemic injury, better appreciated on the prior brain MRI of 11/25/2020 (acute/subacute at that time). There is no acute intracranial hemorrhage. No acute demarcated cortical infarct. No extra-axial fluid collection. No evidence of an intracranial mass. No midline shift. Vascular: No hyperdense vessel.  Atherosclerotic calcifications. Skull: No calvarial fracture or aggressive osseous lesion. Sinuses/Orbits: No mass or acute finding within the imaged orbits.  Mild mucosal thickening within the bilateral ethmoid and sphenoid sinuses at the imaged levels. IMPRESSION: 1. No evidence of an acute intracranial abnormality. 2. Known chronic insults within the bilateral basal ganglia from prior hypoxic/ischemic injury. 3. Cerebral and cerebellar atrophy. 4. Mild paranasal sinus mucosal thickening at the imaged levels. Electronically Signed   By: Jackey Loge D.O.   On: 01/10/2024 14:10     ASSESSMENT & PLAN:   Multiple lung nodules  #Incidental lung nodules right lower lobe lung nodule approximate 2.5 cm; not significantly PET avid.  Other subcentimeter lung nodules bilaterally  [s/p  Dr.Aleskero].  FEB 2025- Multiple bilateral pulmonary nodules, unchanged from 04/17/2021 and not hypermetabolic on 03/19/2021. The possibility of diffuse idiopathic pulmonary neuroendocrine cell hyperplasia cannot be excluded. Consider annual follow-up, as clinically indicated. 2. Trace right pleural fluid.  Left adrenal adenomas  #Colon cancer descending colon stage I [screening colonoscopy]; PT1N0 stage I colon cancer.  Discussed that she is likely cured from her colon cancer with surgery more than 90% at times.  We will hold for any adjuvant therapy.  However I would recommend continued surveillance colonoscopies as recommended by GI. STABLE.   #Uterine enlargement-hypermetabolic lesions/ small abdominal retroperitoneal hypermetabolic lymph node]   Likely benign entities.  S/p evaluation with Dr. Sonia Side.  Recommend follow-up with GYN oncology/follow-up MRI as MAY 2024- and also repeat follow up in mAY 2024 with gyn-onc.  Reminded the patient to follow-up as planned.  # Hx of severe hypokalemia- [ER- feb 2025]  # DISPOSITION: # recommend follow up in 12 months- MD: labs-cbc/cmp; CT prior-- dr.B  # I reviewed the blood work- with the patient in detail; also reviewed the imaging independently [as summarized above]; and with the patient in detail.    All questions were answered.  The patient knows to call the clinic with any problems, questions or concerns.    Earna Coder, MD 02/19/2024 11:00 PM

## 2024-01-19 NOTE — Assessment & Plan Note (Deleted)
#  Colon cancer descending colon stage I [screening colonoscopy]; PT1N0 stage I colon cancer.  Discussed that she is likely cured from her colon cancer with surgery more than 90% at times.  We will hold for any adjuvant therapy.  However I would recommend continued surveillance colonoscopies as recommended by GI. STABLE.    #Incidental lung nodules right lower lobe lung nodule approximate 2.5 cm; not significantly PET avid.  Other subcentimeter lung nodules bilaterally  [s/p Dr.Aleskero].  September 2022 surveillance CT scan-STABLE; patient did not get her scans done as ordered.  We will reschedule the today.  #Uterine enlargement-hypermetabolic lesions/ small abdominal retroperitoneal hypermetabolic lymph node]   Likely benign entities.  S/p evaluation with Dr. Theora Gianotti.  Recommend follow-up with GYN oncology/follow-up MRI as MAY 2024- and also repeat follow up in mAY 2024 with gyn-onc.  Reminded the patient to follow-up as planned.  # DISPOSITION; # RE- schedule the CT chest scan in next 1-2 weeks. # follow up TBD- Dr.B   Cc: Hayley-

## 2024-01-19 NOTE — Progress Notes (Signed)
 CT chest 12/27/23 and head 01/10/24.

## 2024-01-19 NOTE — Assessment & Plan Note (Addendum)
#  Incidental lung nodules right lower lobe lung nodule approximate 2.5 cm; not significantly PET avid.  Other subcentimeter lung nodules bilaterally  [s/p Dr.Aleskero].  FEB 2025- Multiple bilateral pulmonary nodules, unchanged from 04/17/2021 and not hypermetabolic on 03/19/2021. The possibility of diffuse idiopathic pulmonary neuroendocrine cell hyperplasia cannot be excluded. Consider annual follow-up, as clinically indicated. 2. Trace right pleural fluid.  Left adrenal adenomas  #Colon cancer descending colon stage I [screening colonoscopy]; PT1N0 stage I colon cancer.  Discussed that she is likely cured from her colon cancer with surgery more than 90% at times.  We will hold for any adjuvant therapy.  However I would recommend continued surveillance colonoscopies as recommended by GI. STABLE.   #Uterine enlargement-hypermetabolic lesions/ small abdominal retroperitoneal hypermetabolic lymph node]   Likely benign entities.  S/p evaluation with Dr. Sonia Side.  Recommend follow-up with GYN oncology/follow-up MRI as MAY 2024- and also repeat follow up in mAY 2024 with gyn-onc.  Reminded the patient to follow-up as planned.  # Hx of severe hypokalemia- [ER- feb 2025]  # DISPOSITION: # recommend follow up in 12 months- MD: labs-cbc/cmp; CT prior-- dr.B  # I reviewed the blood work- with the patient in detail; also reviewed the imaging independently [as summarized above]; and with the patient in detail.

## 2024-01-20 ENCOUNTER — Telehealth: Payer: Self-pay

## 2024-01-20 ENCOUNTER — Other Ambulatory Visit: Payer: Self-pay

## 2024-01-20 DIAGNOSIS — D259 Leiomyoma of uterus, unspecified: Secondary | ICD-10-CM

## 2024-01-20 NOTE — Telephone Encounter (Signed)
 Dr. Racheal Patches reached out asking if MRI that was due 03/2023 can be rescheduled. Contacted daughter, Fuller Canada and she is ready for MRI to be rescheduled. We will reorder scan and arrange follow up with Dr. Sonia Side for results.

## 2024-01-28 ENCOUNTER — Ambulatory Visit: Admission: RE | Admit: 2024-01-28 | Payer: Medicare Other | Source: Ambulatory Visit

## 2024-02-01 ENCOUNTER — Telehealth: Payer: Self-pay

## 2024-02-22 NOTE — Telephone Encounter (Signed)
 Scheduling has been unable to reach daughter, Fuller Canada, to schedule MRI. They attempted via my chart and they did not read message. Attempts at calling Fuller [place] are unanswered with no voicemail option. Left a voicemail with spouse, Darryl, to return call to the cancer center to arrange MRI and follow up with Dr. Sonia Side for results.

## 2024-02-22 DEATH — deceased

## 2024-02-23 ENCOUNTER — Ambulatory Visit: Payer: Medicare Other

## 2025-01-03 ENCOUNTER — Other Ambulatory Visit: Payer: Medicare Other

## 2025-01-17 ENCOUNTER — Ambulatory Visit: Payer: Medicare Other | Admitting: Internal Medicine

## 2025-01-17 ENCOUNTER — Other Ambulatory Visit: Payer: Medicare Other
